# Patient Record
Sex: Female | Born: 1935 | Race: White | Hispanic: No | Smoking: Never smoker
Health system: Southern US, Community
[De-identification: ages and names within clinical notes are randomized; demographics above are authoritative.]

## PROBLEM LIST (undated history)

## (undated) DIAGNOSIS — T4145XA Adverse effect of unspecified anesthetic, initial encounter: Secondary | ICD-10-CM

## (undated) DIAGNOSIS — Z9221 Personal history of antineoplastic chemotherapy: Secondary | ICD-10-CM

## (undated) DIAGNOSIS — G629 Polyneuropathy, unspecified: Secondary | ICD-10-CM

## (undated) DIAGNOSIS — T8859XA Other complications of anesthesia, initial encounter: Secondary | ICD-10-CM

## (undated) DIAGNOSIS — G5793 Unspecified mononeuropathy of bilateral lower limbs: Secondary | ICD-10-CM

## (undated) DIAGNOSIS — C44301 Unspecified malignant neoplasm of skin of nose: Secondary | ICD-10-CM

## (undated) DIAGNOSIS — K219 Gastro-esophageal reflux disease without esophagitis: Secondary | ICD-10-CM

## (undated) DIAGNOSIS — C50919 Malignant neoplasm of unspecified site of unspecified female breast: Secondary | ICD-10-CM

## (undated) DIAGNOSIS — I499 Cardiac arrhythmia, unspecified: Secondary | ICD-10-CM

## (undated) DIAGNOSIS — F419 Anxiety disorder, unspecified: Secondary | ICD-10-CM

## (undated) DIAGNOSIS — J189 Pneumonia, unspecified organism: Secondary | ICD-10-CM

## (undated) DIAGNOSIS — Z923 Personal history of irradiation: Secondary | ICD-10-CM

## (undated) DIAGNOSIS — C44309 Unspecified malignant neoplasm of skin of other parts of face: Secondary | ICD-10-CM

## (undated) DIAGNOSIS — M1990 Unspecified osteoarthritis, unspecified site: Secondary | ICD-10-CM

## (undated) DIAGNOSIS — I10 Essential (primary) hypertension: Secondary | ICD-10-CM

## (undated) HISTORY — PX: PARTIAL KNEE ARTHROPLASTY: SHX2174

## (undated) HISTORY — DX: Polyneuropathy, unspecified: G62.9

## (undated) HISTORY — PX: JOINT REPLACEMENT: SHX530

## (undated) HISTORY — PX: CATARACT EXTRACTION: SUR2

## (undated) HISTORY — PX: ABDOMINAL HYSTERECTOMY: SHX81

## (undated) HISTORY — PX: NASAL SEPTUM SURGERY: SHX37

## (undated) HISTORY — DX: Unspecified malignant neoplasm of skin of other parts of face: C44.309

## (undated) HISTORY — PX: TOTAL VAGINAL HYSTERECTOMY: SHX2548

## (undated) HISTORY — PX: CHOLECYSTECTOMY: SHX55

## (undated) HISTORY — DX: Unspecified malignant neoplasm of skin of nose: C44.301

---

## 1958-12-09 HISTORY — PX: BREAST BIOPSY: SHX20

## 1998-12-09 DIAGNOSIS — C50919 Malignant neoplasm of unspecified site of unspecified female breast: Secondary | ICD-10-CM

## 1998-12-09 HISTORY — DX: Malignant neoplasm of unspecified site of unspecified female breast: C50.919

## 1998-12-09 HISTORY — PX: BREAST LUMPECTOMY: SHX2

## 2005-09-16 ENCOUNTER — Encounter: Payer: Self-pay | Admitting: Internal Medicine

## 2005-10-09 ENCOUNTER — Encounter: Payer: Self-pay | Admitting: Internal Medicine

## 2007-11-24 ENCOUNTER — Ambulatory Visit: Payer: Self-pay | Admitting: Internal Medicine

## 2008-05-09 ENCOUNTER — Ambulatory Visit: Payer: Self-pay | Admitting: Internal Medicine

## 2008-05-26 ENCOUNTER — Ambulatory Visit: Payer: Self-pay | Admitting: Internal Medicine

## 2008-06-08 ENCOUNTER — Ambulatory Visit: Payer: Self-pay | Admitting: Internal Medicine

## 2008-10-06 ENCOUNTER — Ambulatory Visit: Payer: Self-pay | Admitting: Internal Medicine

## 2008-10-09 ENCOUNTER — Ambulatory Visit: Payer: Self-pay | Admitting: Internal Medicine

## 2008-10-11 ENCOUNTER — Ambulatory Visit: Payer: Self-pay | Admitting: Internal Medicine

## 2008-11-08 ENCOUNTER — Ambulatory Visit: Payer: Self-pay | Admitting: Internal Medicine

## 2008-11-09 ENCOUNTER — Ambulatory Visit: Payer: Self-pay | Admitting: Orthopedic Surgery

## 2008-11-16 ENCOUNTER — Inpatient Hospital Stay: Payer: Self-pay | Admitting: Orthopedic Surgery

## 2008-11-18 ENCOUNTER — Encounter: Payer: Self-pay | Admitting: Anatomic Pathology & Clinical Pathology

## 2008-12-05 ENCOUNTER — Encounter: Payer: Self-pay | Admitting: Internal Medicine

## 2008-12-09 ENCOUNTER — Encounter: Payer: Self-pay | Admitting: Internal Medicine

## 2008-12-29 ENCOUNTER — Ambulatory Visit: Payer: Self-pay | Admitting: Orthopedic Surgery

## 2009-01-09 ENCOUNTER — Encounter: Payer: Self-pay | Admitting: Internal Medicine

## 2009-01-09 ENCOUNTER — Ambulatory Visit: Payer: Self-pay | Admitting: Internal Medicine

## 2009-02-03 ENCOUNTER — Ambulatory Visit: Payer: Self-pay | Admitting: Internal Medicine

## 2009-02-06 ENCOUNTER — Encounter: Payer: Self-pay | Admitting: Internal Medicine

## 2009-02-06 ENCOUNTER — Ambulatory Visit: Payer: Self-pay | Admitting: Internal Medicine

## 2009-04-06 ENCOUNTER — Ambulatory Visit: Payer: Self-pay | Admitting: Internal Medicine

## 2009-04-08 ENCOUNTER — Ambulatory Visit: Payer: Self-pay | Admitting: Internal Medicine

## 2009-04-12 ENCOUNTER — Ambulatory Visit: Payer: Self-pay | Admitting: Internal Medicine

## 2009-05-09 ENCOUNTER — Ambulatory Visit: Payer: Self-pay | Admitting: Internal Medicine

## 2009-09-11 ENCOUNTER — Ambulatory Visit: Payer: Self-pay | Admitting: Internal Medicine

## 2009-10-09 ENCOUNTER — Ambulatory Visit: Payer: Self-pay | Admitting: Internal Medicine

## 2009-10-25 ENCOUNTER — Ambulatory Visit: Payer: Self-pay | Admitting: Internal Medicine

## 2009-11-08 ENCOUNTER — Ambulatory Visit: Payer: Self-pay | Admitting: Internal Medicine

## 2009-11-21 ENCOUNTER — Ambulatory Visit: Payer: Self-pay | Admitting: Internal Medicine

## 2009-12-26 ENCOUNTER — Ambulatory Visit: Payer: Self-pay | Admitting: Pain Medicine

## 2010-01-08 ENCOUNTER — Ambulatory Visit: Payer: Self-pay | Admitting: Pain Medicine

## 2010-01-16 ENCOUNTER — Ambulatory Visit: Payer: Self-pay | Admitting: Pain Medicine

## 2010-02-01 ENCOUNTER — Ambulatory Visit: Payer: Self-pay | Admitting: Pain Medicine

## 2010-10-31 ENCOUNTER — Ambulatory Visit: Payer: Self-pay | Admitting: Internal Medicine

## 2010-11-21 ENCOUNTER — Ambulatory Visit: Payer: Self-pay | Admitting: Internal Medicine

## 2011-02-05 ENCOUNTER — Ambulatory Visit: Payer: Self-pay | Admitting: Internal Medicine

## 2011-02-06 LAB — CANCER ANTIGEN 27.29: CA 27.29: 14.7 U/mL (ref 0.0–38.6)

## 2011-02-07 ENCOUNTER — Ambulatory Visit: Payer: Self-pay | Admitting: Internal Medicine

## 2011-10-29 ENCOUNTER — Ambulatory Visit: Payer: Self-pay | Admitting: Internal Medicine

## 2012-02-06 ENCOUNTER — Ambulatory Visit: Payer: Self-pay | Admitting: Oncology

## 2012-02-06 LAB — COMPREHENSIVE METABOLIC PANEL
Albumin: 3.6 g/dL (ref 3.4–5.0)
Alkaline Phosphatase: 89 U/L (ref 50–136)
Anion Gap: 7 (ref 7–16)
BUN: 18 mg/dL (ref 7–18)
Bilirubin,Total: 0.5 mg/dL (ref 0.2–1.0)
Calcium, Total: 8.8 mg/dL (ref 8.5–10.1)
Chloride: 101 mmol/L (ref 98–107)
Co2: 31 mmol/L (ref 21–32)
Creatinine: 0.76 mg/dL (ref 0.60–1.30)
EGFR (African American): >60
EGFR (Non-African Amer.): >60
Glucose: 94 mg/dL (ref 65–99)
Osmolality: 279 (ref 275–301)
Potassium: 3.7 mmol/L (ref 3.5–5.1)
SGOT(AST): 19 U/L (ref 15–37)
SGPT (ALT): 24 U/L
Sodium: 139 mmol/L (ref 136–145)
Total Protein: 7.5 g/dL (ref 6.4–8.2)

## 2012-02-06 LAB — CBC CANCER CENTER
Basophil #: 0 x10 3/mm (ref 0.0–0.1)
Basophil %: 0.5 %
Eosinophil #: 0.2 x10 3/mm (ref 0.0–0.7)
Eosinophil %: 3.2 %
HCT: 39.4 % (ref 35.0–47.0)
HGB: 13.5 g/dL (ref 12.0–16.0)
Lymphocyte #: 1.1 x10 3/mm (ref 1.0–3.6)
Lymphocyte %: 17 %
MCH: 30.4 pg (ref 26.0–34.0)
MCHC: 34.2 g/dL (ref 32.0–36.0)
MCV: 89 fL (ref 80–100)
Monocyte #: 0.5 x10 3/mm (ref 0.0–0.7)
Monocyte %: 7.1 %
Neutrophil #: 4.9 x10 3/mm (ref 1.4–6.5)
Neutrophil %: 72.2 %
Platelet: 207 x10 3/mm (ref 150–440)
RBC: 4.43 10*6/uL (ref 3.80–5.20)
RDW: 14.3 % (ref 11.5–14.5)
WBC: 6.8 x10 3/mm (ref 3.6–11.0)

## 2012-02-07 ENCOUNTER — Ambulatory Visit: Payer: Self-pay | Admitting: Oncology

## 2012-02-07 LAB — CANCER ANTIGEN 27.29: CA 27.29: 19.1 U/mL (ref 0.0–38.6)

## 2012-05-17 ENCOUNTER — Encounter (HOSPITAL_COMMUNITY): Payer: Self-pay

## 2012-05-17 ENCOUNTER — Emergency Department (HOSPITAL_COMMUNITY)
Admission: EM | Admit: 2012-05-17 | Discharge: 2012-05-17 | Disposition: A | Discharge status: Home / Self Care | Payer: MEDICARE | Source: Home / Self Care | Attending: Emergency Medicine | Admitting: Emergency Medicine

## 2012-05-17 DIAGNOSIS — L259 Unspecified contact dermatitis, unspecified cause: Secondary | ICD-10-CM

## 2012-05-17 HISTORY — DX: Unspecified mononeuropathy of bilateral lower limbs: G57.93

## 2012-05-17 HISTORY — DX: Essential (primary) hypertension: I10

## 2012-05-17 NOTE — Discharge Instructions (Signed)
Carefully use the cortisone cream on the rash twice a day; be extra careful to not get it in your eye.  Try taking Claritin (or the generic version called loratadine) once daily to help with the itching.  If this does not get better or if you feel that it is spreading and getting worse, follow up with your regular doctor or your dermatologist.    Contact Dermatitis Contact dermatitis is a rash that happens when something touches the skin. You touched something that irritates your skin, or you have allergies to something you touched. HOME CARE   Avoid the thing that caused your rash.   Keep your rash away from hot water, soap, sunlight, chemicals, and other things that might bother it.   Do not scratch your rash.   You can take cool baths to help stop itching.   Only take medicine as told by your doctor.   Keep all doctor visits as told.  GET HELP RIGHT AWAY IF:   Your rash is not better after 3 days.   Your rash gets worse.   Your rash is puffy (swollen), tender, red, sore, or warm.   You have problems with your medicine.  MAKE SURE YOU:   Understand these instructions.   Will watch your condition.   Will get help right away if you are not doing well or get worse.  Document Released: 09/22/2009 Document Revised: 11/14/2011 Document Reviewed: 04/30/2011 [facility] Patient Information 2012 [place], [place].

## 2012-05-17 NOTE — ED Provider Notes (Signed)
Medical screening examination/treatment/procedure(s) were performed by non-physician practitioner and as supervising physician I was immediately available for consultation/collaboration.  Luiz Blare MD   Luiz Blare, MD 05/17/12 [id number]

## 2012-05-17 NOTE — ED Provider Notes (Signed)
History     CSN: [id number]  Arrival date & time 05/17/12  1402   First MD Initiated Contact with Patient 05/17/12 1625      Chief Complaint  Patient presents with  . Rash    (Consider location/radiation/quality/duration/timing/severity/associated sxs/prior treatment) HPI Comments: Pt noticed itching red spot under R eye yesterday, describes as red with blister, scratched it, now has similar spot above it on R lower eyelid.  Describes as non painful but very itching.  Has not been spending time outdoors in woods, only around own plants in apartment.    Patient is a 78 y.o. female presenting with rash. The history is provided by the patient.  Rash  The current episode started yesterday. The problem has been gradually worsening. The problem is associated with an unknown factor. There has been no fever. The rash is present on the face. The patient is experiencing no pain. Associated symptoms include itching. She has tried anti-itch cream for the symptoms. The treatment provided mild relief.    Past Medical History  Diagnosis Date  . Hypertension   . Neuropathic pain of both legs     History reviewed. No pertinent past surgical history.  History reviewed. No pertinent family history.  History  Substance Use Topics  . Smoking status: Never Smoker   . Smokeless tobacco: Not on file  . Alcohol Use: No    OB History    Grav Para Term Preterm Abortions TAB SAB Ect Mult Living                  Review of Systems  Constitutional: Negative for fever and chills.  Eyes: Negative for pain, discharge, redness, itching and visual disturbance.  Skin: Positive for itching and rash.  Neurological: Negative for numbness.       Denies tingling or burning pain sensation in rash area; received shingles vaccine years ago    Allergies  Sulfa antibiotics  Home Medications   Current Outpatient Rx  Name Route Sig Dispense Refill  . GABAPENTIN 300 MG PO CAPS Oral Take 300 mg by mouth 3  (three) times daily.    Marland Kitchen LISINOPRIL-HYDROCHLOROTHIAZIDE 10-12.5 MG PO TABS Oral Take 1 tablet by mouth daily.      BP 160/90  Pulse 69  Temp(Src) 98 F (36.7 C) (Oral)  Resp 12  SpO2 95%  Physical Exam  Constitutional: She appears well-developed and well-nourished. No distress.  Eyes: Conjunctivae and lids are normal.    Pulmonary/Chest: Effort normal.  Skin: Skin is warm and dry. Rash noted. Rash is vesicular.       See eye exam for rash location. 2 erythematous areas below R eye with open areas; no vesicles at this time but pt reports vesicles, and rash has appearance of opened vesicles.     ED Course  Procedures (including critical care time)  Labs Reviewed - No data to display No results found.   1. Contact dermatitis       MDM  Appears c/w contact derm. Pt to f/u with pcp if worsens or doesn't improve.  Pt refuses offer of  Po steroids to help resolve sx, elects to continue using otc cortisone cream.         Cathlyn Parsons, NP 05/17/12 1707

## 2012-05-17 NOTE — ED Notes (Signed)
Pt has lesion under rt eye that started itching yesterday and then developed another lesion near lower lid, denies pain to the area.

## 2012-07-13 ENCOUNTER — Encounter: Payer: Self-pay | Admitting: Internal Medicine

## 2012-08-09 ENCOUNTER — Encounter: Payer: Self-pay | Admitting: Internal Medicine

## 2012-08-17 ENCOUNTER — Encounter: Payer: Self-pay | Admitting: Cardiovascular Disease

## 2012-08-17 ENCOUNTER — Ambulatory Visit (INDEPENDENT_AMBULATORY_CARE_PROVIDER_SITE_OTHER): Payer: MEDICARE | Admitting: Cardiovascular Disease

## 2012-08-17 VITALS — BP 150/80 | HR 57 | Ht 65.0 in | Wt 175.8 lb

## 2012-08-17 DIAGNOSIS — F4320 Adjustment disorder, unspecified: Secondary | ICD-10-CM

## 2012-08-17 DIAGNOSIS — I10 Essential (primary) hypertension: Secondary | ICD-10-CM

## 2012-08-17 DIAGNOSIS — Z853 Personal history of malignant neoplasm of breast: Secondary | ICD-10-CM

## 2012-08-17 NOTE — Progress Notes (Signed)
Patient ID: Kelli Williams, female    DOB: 14-Aug-1926, 78 y.o.   MRN: [id number]  HPI Comments: Kelli Williams is a very pleasant 78 year old woman with no known coronary artery disease or cardiac issues who presents to establish care. She reports that she had radiation treatment for breast cancer in 2000 on the left with 7 weeks of radiation on a daily basis..  She denies any significant chest pain. She did have stress test last year at the recommendation of her cardiologist at [facility] as she had not had one in the past. He feels well, is active and takes care of her husband who has significant Alzheimer's. Her blood pressures labile which she attributes to stress at home.  She does report that her mother had diabetes and CHF.  Lab work shows total cholesterol 162, LDL 88 EKG shows normal sinus rhythm with right bundle branch block, rate 78 beats per minute, no significant ST or T wave changes   Outpatient Encounter Prescriptions as of 08/17/2012  Medication Sig Dispense Refill  . Calcium-Magnesium 500-250 MG TABS Take by mouth daily.      . cholecalciferol (VITAMIN D) 1000 UNITS tablet Take 1,000 Units by mouth daily.      . Coenzyme Q10 (CO Q-10) 100 MG CAPS Take by mouth daily.      . Docosahexaenoic Acid (DHA OMEGA 3 PO) Take 180 mg by mouth daily.      Marland Kitchen gabapentin (NEURONTIN) 300 MG capsule Take 300 mg by mouth 2 (two) times daily.       Marland Kitchen lisinopril-hydrochlorothiazide (PRINZIDE,ZESTORETIC) 20-25 MG per tablet Take 1 tablet by mouth daily.      . Multiple Vitamin (MULTIVITAMIN) tablet Take 1 tablet by mouth daily.      . Thiamine HCl (B-1 PO) Take 160 mg by mouth daily.      Marland Kitchen DISCONTD: lisinopril-hydrochlorothiazide (PRINZIDE,ZESTORETIC) 10-12.5 MG per tablet Take 1 tablet by mouth daily.        Review of Systems  Constitutional: Negative.   HENT: Negative.   Eyes: Negative.   Respiratory: Negative.   Cardiovascular: Negative.   Gastrointestinal: Negative.   Musculoskeletal:  Negative.   Skin: Negative.   Neurological: Negative.   Hematological: Negative.   Psychiatric/Behavioral: Negative.   All other systems reviewed and are negative.    BP 150/80  Pulse 57  Ht 5\' 5"  (1.651 m)  Wt 175 lb 12 oz (79.72 kg)  BMI 29.25 kg/m2  Physical Exam  Nursing note and vitals reviewed. Constitutional: She is oriented to person, place, and time. She appears well-developed and well-nourished.  HENT:  Head: Normocephalic.  Nose: Nose normal.  Mouth/Throat: Oropharynx is clear and moist.  Eyes: Conjunctivae are normal. Pupils are equal, round, and reactive to light.  Neck: Normal range of motion. Neck supple. No JVD present.  Cardiovascular: Normal rate, regular rhythm, S1 normal, S2 normal, normal heart sounds and intact distal pulses.  Exam reveals no gallop and no friction rub.   No murmur heard. Pulmonary/Chest: Effort normal and breath sounds normal. No respiratory distress. She has no wheezes. She has no rales. She exhibits no tenderness.  Abdominal: Soft. Bowel sounds are normal. She exhibits no distension. There is no tenderness.  Musculoskeletal: Normal range of motion. She exhibits no edema and no tenderness.  Lymphadenopathy:    She has no cervical adenopathy.  Neurological: She is alert and oriented to person, place, and time. Coordination normal.  Skin: Skin is warm and dry. No rash noted. No  erythema.  Psychiatric: She has a normal mood and affect. Her behavior is normal. Judgment and thought content normal.         Assessment and Plan

## 2012-08-17 NOTE — Assessment & Plan Note (Signed)
History of radiation treatment on the left following lumpectomy. She is nonsmoker, nondiabetic, cholesterol is well controlled. Negative stress test last year by her report.

## 2012-08-17 NOTE — Assessment & Plan Note (Signed)
Blood pressure is well controlled on today's visit. No changes made to the medications. 

## 2012-08-17 NOTE — Patient Instructions (Addendum)
You are doing well. No medication changes were made.  Please call us if you have new issues that need to be addressed before your next appt.  Your physician wants you to follow-up in: 12 months.  You will receive a reminder letter in the mail two months in advance. If you don't receive a letter, please call our office to schedule the follow-up appointment. 

## 2012-08-17 NOTE — Assessment & Plan Note (Signed)
She has significant stress managing her husband's medical issues. She does have family in town but does need a respite.

## 2012-09-08 ENCOUNTER — Encounter: Payer: Self-pay | Admitting: Internal Medicine

## 2012-11-03 ENCOUNTER — Ambulatory Visit: Payer: Self-pay | Admitting: Internal Medicine

## 2012-11-27 ENCOUNTER — Ambulatory Visit: Payer: Self-pay | Admitting: Ophthalmology

## 2012-11-27 LAB — POTASSIUM: Potassium: 3.7 mmol/L (ref 3.5–5.1)

## 2012-12-07 ENCOUNTER — Ambulatory Visit: Payer: Self-pay | Admitting: Ophthalmology

## 2013-08-24 ENCOUNTER — Encounter: Payer: Self-pay | Admitting: Cardiovascular Disease

## 2013-08-24 ENCOUNTER — Ambulatory Visit (INDEPENDENT_AMBULATORY_CARE_PROVIDER_SITE_OTHER): Payer: MEDICARE | Admitting: Cardiovascular Disease

## 2013-08-24 VITALS — BP 140/72 | HR 66 | Ht 65.0 in | Wt 177.2 lb

## 2013-08-24 DIAGNOSIS — Z0000 Encounter for general adult medical examination without abnormal findings: Secondary | ICD-10-CM

## 2013-08-24 DIAGNOSIS — F4320 Adjustment disorder, unspecified: Secondary | ICD-10-CM

## 2013-08-24 DIAGNOSIS — I10 Essential (primary) hypertension: Secondary | ICD-10-CM

## 2013-08-24 NOTE — Assessment & Plan Note (Signed)
Overall she is doing well. Cholesterol well controlled. No further testing needed at this time

## 2013-08-24 NOTE — Assessment & Plan Note (Signed)
Blood pressure is well controlled on today's visit. No changes made to the medications. 

## 2013-08-24 NOTE — Assessment & Plan Note (Signed)
Significant stress at home taking care of her husband with dementia.

## 2013-08-24 NOTE — Progress Notes (Signed)
Patient ID: Kelli Williams, female    DOB: 02-08-1926, 79 y.o.   MRN: [id number]  HPI Comments: Kelli Williams is a very pleasant 79 year old woman with no known coronary artery disease or cardiac issues who presents for routine followup Status post radiation treatment for breast cancer in 2000 on the left with 7 weeks of radiation on a daily basis.   She denies any significant chest pain. She did have stress test in 2012 at the recommendation of her cardiologist at [facility] He feels well, is active and takes care of her husband who has significant Alzheimer's. Her blood pressures labile which she attributes to stress at home. She does report having mild edema of her legs bilaterally, worse after being on her feet all day   mother had diabetes and CHF.  Lab work from September 2013 shows total cholesterol 167, LDL 91 EKG shows normal sinus rhythm with right bundle branch block, rate 66 beats per minute, no significant ST or T wave changes   Outpatient Encounter Prescriptions as of 08/24/2013  Medication Sig Dispense Refill  . aspirin 81 MG tablet Take 81 mg by mouth daily.      . Calcium-Magnesium 500-250 MG TABS Take by mouth daily.      . cholecalciferol (VITAMIN D) 1000 UNITS tablet Take 1,000 Units by mouth daily.      . Coenzyme Q10 (CO Q-10) 100 MG CAPS Take by mouth daily.      . Docosahexaenoic Acid (DHA OMEGA 3 PO) Take 180 mg by mouth daily.      Marland Kitchen gabapentin (NEURONTIN) 300 MG capsule Take 300 mg by mouth 2 (two) times daily. Takes 1 tablet in the am with 2 tablets in the pm.      . lisinopril-hydrochlorothiazide (PRINZIDE,ZESTORETIC) 20-25 MG per tablet Take 1 tablet by mouth daily.      . Multiple Vitamin (MULTIVITAMIN) tablet Take 1 tablet by mouth daily.      . Thiamine HCl (B-1 PO) Take 160 mg by mouth daily.       No facility-administered encounter medications on file as of 08/24/2013.    Review of Systems  Constitutional: Negative.   HENT: Negative.   Eyes: Negative.    Respiratory: Negative.   Cardiovascular: Negative.   Gastrointestinal: Negative.   Endocrine: Negative.   Musculoskeletal: Negative.   Skin: Negative.   Allergic/Immunologic: Negative.   Neurological: Negative.   Hematological: Negative.   Psychiatric/Behavioral: Negative.   All other systems reviewed and are negative.   BP 140/72  Pulse 66  Ht 5\' 5"  (1.651 m)  Wt 177 lb 4 oz (80.4 kg)  BMI 29.5 kg/m2  Physical Exam  Nursing note and vitals reviewed. Constitutional: She is oriented to person, place, and time. She appears well-developed and well-nourished.  HENT:  Head: Normocephalic.  Nose: Nose normal.  Mouth/Throat: Oropharynx is clear and moist.  Eyes: Conjunctivae are normal. Pupils are equal, round, and reactive to light.  Neck: Normal range of motion. Neck supple. No JVD present.  Cardiovascular: Normal rate, regular rhythm, S1 normal, S2 normal, normal heart sounds and intact distal pulses.  Exam reveals no gallop and no friction rub.   No murmur heard. Pulmonary/Chest: Effort normal and breath sounds normal. No respiratory distress. She has no wheezes. She has no rales. She exhibits no tenderness.  Abdominal: Soft. Bowel sounds are normal. She exhibits no distension. There is no tenderness.  Musculoskeletal: Normal range of motion. She exhibits no edema and no tenderness.  Lymphadenopathy:  She has no cervical adenopathy.  Neurological: She is alert and oriented to person, place, and time. Coordination normal.  Skin: Skin is warm and dry. No rash noted. No erythema.  Psychiatric: She has a normal mood and affect. Her behavior is normal. Judgment and thought content normal.    Assessment and Plan

## 2013-08-24 NOTE — Patient Instructions (Addendum)
You are doing well. No medication changes were made.  Please call us if you have new issues that need to be addressed before your next appt.  Your physician wants you to follow-up in: 12 months.  You will receive a reminder letter in the mail two months in advance. If you don't receive a letter, please call our office to schedule the follow-up appointment. 

## 2013-12-14 ENCOUNTER — Ambulatory Visit: Payer: Self-pay | Admitting: Family Medicine

## 2014-05-03 ENCOUNTER — Telehealth: Payer: Self-pay | Admitting: *Deleted

## 2014-05-03 NOTE — Telephone Encounter (Signed)
Patient called and stated her blood pressure has been elevated  At 9 am 139/93 At 12 pm today 165/106 It has been elevated for a few weeks  She is experiencing headaches and ringing in her ears  She would like to know if she should increase her blood pressure medications

## 2014-05-05 NOTE — Telephone Encounter (Signed)
Would start amlodipine 5 mg daily Monitor BP

## 2014-05-06 MED ORDER — AMLODIPINE BESYLATE 5 MG PO TABS: 5.0000 mg | ORAL_TABLET | Freq: Every day | ORAL | Status: DC

## 2014-05-06 NOTE — Telephone Encounter (Signed)
Spoke w/ pt.  Advised her of Dr. Donivan Scull recommendation.  She reports that she broke a bone in her foot and has been walking around on it for some time now.  She is agreeable and will call w/ further questions or concerns.

## 2014-05-20 ENCOUNTER — Telehealth: Payer: Self-pay

## 2014-05-20 NOTE — Telephone Encounter (Signed)
Pt called states she was taking HTCZ Lisinopril, states BP was "getting a little too high", then she started on Amlodipine , states this makes her very lightheaded, but has helped her BP 149/67.

## 2014-05-20 NOTE — Telephone Encounter (Signed)
LVM 6/12

## 2014-05-24 NOTE — Telephone Encounter (Signed)
Spoke w/ pt.  She reports that her BP has been "all over the place". She has a broken foot, hobbling around on a boot, taking Mobic for inflammation.  Her husband is total care and requires her to help him w/ ADLs. Pt's CNA did not come yesterday, and BP was elevated after getting husband in shower: 144/102. Recent BP readings: 118/72, 121/70, 149/69, 121/70, 118/72, 119/74, 107/65.  She was prescribed amlodipine 5mg  at last ov on 05/06/14 and states that she does not like the way this med makes her feel.  Pt reports that she feels tired, and not like herself.  She took a 1/2 amlodipine yesterday, but states that she did not feel any different.  Pt admits that she is under quite a bit of stress and is unsure if Mobic may be contributing to the way she feels. Advised pt that BP numbers look good, but she states that she does not like the amlodipine and would like to know if there is something else that she can take that will not make her feel so tired.  Please advise.  Thank you.

## 2014-05-24 NOTE — Telephone Encounter (Signed)
Blood pressure numbers look good without the amlodipine Perhaps continue to monitor the blood pressure for now, no new medication Hold the amlodipine

## 2014-05-25 NOTE — Telephone Encounter (Signed)
Spoke w/ pt.  Advised her of Dr. Donivan Scull recommendation. She is agreeable and will call w/ further questions or concerns.

## 2014-08-24 ENCOUNTER — Ambulatory Visit (INDEPENDENT_AMBULATORY_CARE_PROVIDER_SITE_OTHER): Payer: Medicare Other | Admitting: Cardiovascular Disease

## 2014-08-24 ENCOUNTER — Encounter: Payer: Self-pay | Admitting: Cardiovascular Disease

## 2014-08-24 VITALS — BP 140/82 | HR 63 | Ht 64.0 in | Wt 182.5 lb

## 2014-08-24 DIAGNOSIS — F4329 Adjustment disorder with other symptoms: Secondary | ICD-10-CM

## 2014-08-24 DIAGNOSIS — I498 Other specified cardiac arrhythmias: Secondary | ICD-10-CM

## 2014-08-24 DIAGNOSIS — F438 Other reactions to severe stress: Secondary | ICD-10-CM

## 2014-08-24 DIAGNOSIS — I10 Essential (primary) hypertension: Secondary | ICD-10-CM

## 2014-08-24 DIAGNOSIS — I471 Supraventricular tachycardia: Secondary | ICD-10-CM

## 2014-08-24 DIAGNOSIS — F4389 Other reactions to severe stress: Secondary | ICD-10-CM

## 2014-08-24 MED ORDER — PROPRANOLOL HCL 10 MG PO TABS: 10.0000 mg | ORAL_TABLET | Freq: Three times a day (TID) | ORAL | Status: DC | PRN

## 2014-08-24 NOTE — Progress Notes (Signed)
Patient ID: Kelli Williams, female    DOB: 02-09-1926, 80 y.o.   MRN: [id number]  HPI Comments: Kelli Williams is a very pleasant 80 year old woman with no known coronary artery disease or cardiac issues who presents for routine followup Status post radiation treatment for breast cancer in 2000 on the left with 7 weeks of radiation on a daily basis.   In followup today, she reports having some palpitations/tachycardia most commonly at nighttime. These are worse if she is tired. Less symptoms in the daytime. She continues to have stress taking care of her husband. She does not have enough help at home, especially on the weekend  She denies any significant chest pain. No significant shortness of breath with exertion  She did have stress test in 2012 at the recommendation of her cardiologist at [facility]  Her blood pressures continued to be labile which she attributes to stress at home. She does report having mild edema of her legs bilaterally, worse after being on her feet all day She reports having a fracture in her right foot, seen by podiatry and Dr. Marry Guan   mother had diabetes and CHF.  Lab work from September 2013 shows total cholesterol 167, LDL 91 EKG shows normal sinus rhythm with right bundle branch block, rate 63 beats per minute, no significant ST or T wave changes   Outpatient Encounter Prescriptions as of 08/24/2014  Medication Sig  . amLODipine (NORVASC) 5 MG tablet Take 1 tablet (5 mg total) by mouth daily.  Marland Kitchen aspirin 81 MG tablet Take 81 mg by mouth daily.  . Calcium-Magnesium 500-250 MG TABS Take by mouth daily.  . cholecalciferol (VITAMIN D) 1000 UNITS tablet Take 1,000 Units by mouth daily.  . Coenzyme Q10 (CO Q-10) 100 MG CAPS Take by mouth daily.  . Docosahexaenoic Acid (DHA OMEGA 3 PO) Take 180 mg by mouth daily.  Marland Kitchen gabapentin (NEURONTIN) 300 MG capsule Take 300 mg by mouth 2 (two) times daily. Takes 1 tablet in the am with 2 tablets in the pm.  .  lisinopril-hydrochlorothiazide (PRINZIDE,ZESTORETIC) 20-25 MG per tablet Take 1 tablet by mouth daily.  . Multiple Vitamin (MULTIVITAMIN) tablet Take 1 tablet by mouth daily.  . Thiamine HCl (B-1 PO) Take 160 mg by mouth daily.    Review of Systems  Constitutional: Negative.   HENT: Negative.   Eyes: Negative.   Respiratory: Negative.   Cardiovascular: Negative.   Gastrointestinal: Negative.   Endocrine: Negative.   Musculoskeletal: Negative.   Skin: Negative.   Allergic/Immunologic: Negative.   Neurological: Negative.   Hematological: Negative.   Psychiatric/Behavioral: Negative.   All other systems reviewed and are negative.  BP 140/82  Ht 5\' 4"  (1.626 m)  Wt 182 lb 8 oz (82.781 kg)  BMI 31.31 kg/m2  Physical Exam  Nursing note and vitals reviewed. Constitutional: She is oriented to person, place, and time. She appears well-developed and well-nourished.  HENT:  Head: Normocephalic.  Nose: Nose normal.  Mouth/Throat: Oropharynx is clear and moist.  Eyes: Conjunctivae are normal. Pupils are equal, round, and reactive to light.  Neck: Normal range of motion. Neck supple. No JVD present.  Cardiovascular: Normal rate, regular rhythm, S1 normal, S2 normal, normal heart sounds and intact distal pulses.  Exam reveals no gallop and no friction rub.   No murmur heard. Pulmonary/Chest: Effort normal and breath sounds normal. No respiratory distress. She has no wheezes. She has no rales. She exhibits no tenderness.  Abdominal: Soft. Bowel sounds are normal. She exhibits  no distension. There is no tenderness.  Musculoskeletal: Normal range of motion. She exhibits no edema and no tenderness.  Lymphadenopathy:    She has no cervical adenopathy.  Neurological: She is alert and oriented to person, place, and time. Coordination normal.  Skin: Skin is warm and dry. No rash noted. No erythema.  Psychiatric: She has a normal mood and affect. Her behavior is normal. Judgment and thought  content normal.    Assessment and Plan

## 2014-08-24 NOTE — Assessment & Plan Note (Signed)
Continues to care for her husband who has dementia. This has been very stressful

## 2014-08-24 NOTE — Assessment & Plan Note (Signed)
Blood pressure well controlled on today's visit. She reports it is somewhat labile depending on her stress. No medication changes made

## 2014-08-24 NOTE — Assessment & Plan Note (Signed)
Paroxysmal atrial tachycardia. EKG today showed short run of narrow complex tachycardia with rate more than 100 beats per minute. She does appreciate this at nighttime when she is tired. We have suggested she take propranolol 10 mg as needed for symptoms in the evening. Recommended she call us if symptoms do not improve

## 2014-08-24 NOTE — Patient Instructions (Addendum)
You are doing well.  For fast heart rate or tachycardia, Please take propranolol as needed  Please call us if you have new issues that need to be addressed before your next appt.  Your physician wants you to follow-up in: 12 months.  You will receive a reminder letter in the mail two months in advance. If you don't receive a letter, please call our office to schedule the follow-up appointment.

## 2014-10-03 ENCOUNTER — Ambulatory Visit (INDEPENDENT_AMBULATORY_CARE_PROVIDER_SITE_OTHER): Payer: Medicare Other | Admitting: Podiatry

## 2014-10-03 ENCOUNTER — Ambulatory Visit (INDEPENDENT_AMBULATORY_CARE_PROVIDER_SITE_OTHER): Payer: Medicare Other

## 2014-10-03 ENCOUNTER — Encounter: Payer: Self-pay | Admitting: Podiatry

## 2014-10-03 ENCOUNTER — Other Ambulatory Visit: Payer: Self-pay | Admitting: *Deleted

## 2014-10-03 VITALS — BP 161/97 | HR 70 | Resp 16 | Ht 64.0 in | Wt 177.0 lb

## 2014-10-03 DIAGNOSIS — M19079 Primary osteoarthritis, unspecified ankle and foot: Secondary | ICD-10-CM

## 2014-10-03 DIAGNOSIS — M722 Plantar fascial fibromatosis: Secondary | ICD-10-CM

## 2014-10-03 DIAGNOSIS — S92001A Unspecified fracture of right calcaneus, initial encounter for closed fracture: Secondary | ICD-10-CM

## 2014-10-03 NOTE — Progress Notes (Signed)
   Subjective:    Patient ID: Kelli Williams, female    DOB: Apr 02, 1926, 80 y.o.   MRN: [id number]  HPI Comments: The right foot , has been hurting since the 1st of the year, top of foot all the way to the big toe . Wore a big boot for awhile and been to several other doctors,  i have been to the vascular doctor for studies to rule out blood clot .   Foot Pain      Review of Systems  All other systems reviewed and are negative.      Objective:   Physical Exam: I have reviewed her past medical history medications allergies surgery social history and radiographs that she brought with her today. Pulses are strongly palpable bilateral. Neurologic sensorium is intact per Semmes-Weinstein monofilament. Deep tendon reflexes are intact bilateral and muscle strength is 5 over 5 dorsiflexors plantar flexors and inverters everters all intrinsic musculature is intact. Orthopedic evaluation demonstrates all joints distal to the ankle for range of motion without crepitation. She does have severe pes planus bilateral with pain on palpation to the first metatarsal medial cuneiform joint extending over to the third metatarsal cuneiform joint. Right foot appears to be worse than the left. Radiographic evaluation does demonstrate severe osteoarthritis in this area bilateral with severe pes planus.        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  Plan: Injected dexamethasone/Kenalog and local anesthetic to the dorsal aspect of the bilateral foot. I will follow up with her in a few months if needed.

## 2014-12-05 ENCOUNTER — Encounter: Payer: Self-pay | Admitting: Cardiovascular Disease

## 2014-12-09 DIAGNOSIS — J189 Pneumonia, unspecified organism: Secondary | ICD-10-CM

## 2014-12-09 HISTORY — DX: Pneumonia, unspecified organism: J18.9

## 2015-02-15 DIAGNOSIS — I10 Essential (primary) hypertension: Secondary | ICD-10-CM | POA: Diagnosis not present

## 2015-02-15 DIAGNOSIS — I472 Ventricular tachycardia: Secondary | ICD-10-CM | POA: Diagnosis not present

## 2015-02-15 DIAGNOSIS — G609 Hereditary and idiopathic neuropathy, unspecified: Secondary | ICD-10-CM | POA: Diagnosis not present

## 2015-02-15 DIAGNOSIS — M19171 Post-traumatic osteoarthritis, right ankle and foot: Secondary | ICD-10-CM | POA: Diagnosis not present

## 2015-02-15 DIAGNOSIS — Z853 Personal history of malignant neoplasm of breast: Secondary | ICD-10-CM | POA: Diagnosis not present

## 2015-02-27 DIAGNOSIS — I10 Essential (primary) hypertension: Secondary | ICD-10-CM | POA: Diagnosis not present

## 2015-02-27 DIAGNOSIS — J40 Bronchitis, not specified as acute or chronic: Secondary | ICD-10-CM | POA: Diagnosis not present

## 2015-03-10 DIAGNOSIS — G609 Hereditary and idiopathic neuropathy, unspecified: Secondary | ICD-10-CM | POA: Diagnosis not present

## 2015-03-10 DIAGNOSIS — M19171 Post-traumatic osteoarthritis, right ankle and foot: Secondary | ICD-10-CM | POA: Diagnosis not present

## 2015-03-10 DIAGNOSIS — R262 Difficulty in walking, not elsewhere classified: Secondary | ICD-10-CM | POA: Diagnosis not present

## 2015-03-10 DIAGNOSIS — J40 Bronchitis, not specified as acute or chronic: Secondary | ICD-10-CM | POA: Diagnosis not present

## 2015-03-14 ENCOUNTER — Ambulatory Visit
Admit: 2015-03-14 | Disposition: A | Discharge status: Home / Self Care | Payer: Self-pay | Attending: Internal Medicine | Admitting: Internal Medicine

## 2015-03-14 DIAGNOSIS — Z853 Personal history of malignant neoplasm of breast: Secondary | ICD-10-CM | POA: Diagnosis not present

## 2015-03-14 DIAGNOSIS — Z1231 Encounter for screening mammogram for malignant neoplasm of breast: Secondary | ICD-10-CM | POA: Diagnosis not present

## 2015-03-31 NOTE — Op Note (Signed)
PATIENT NAME:  Kelli Williams, Kelli Williams MR#:  [id number] DATE OF BIRTH:  [birth year]  DATE OF PROCEDURE:  12/07/2012  PREOPERATIVE DIAGNOSIS:  Cataract, left eye.    POSTOPERATIVE DIAGNOSIS:  Cataract, left eye.  PROCEDURE PERFORMED:  Extracapsular cataract extraction using phacoemulsification with placement of an Alcon SN60WS, 22.0-diopter posterior chamber lens, serial # [id number].  SURGEON:  Loura Back. Winnie Barsky, MD  ASSISTANT:  None.  ANESTHESIA:  4% lidocaine and 0.75% Marcaine in a 50/50 mixture with 10 units per mL of HyoMax added given as a peribulbar.  ANESTHESIOLOGIST:  Dr. [place] Service  COMPLICATIONS:  None.  ESTIMATED BLOOD LOSS:  Less than 1 ml.  DESCRIPTION OF PROCEDURE:  The patient was brought to the operating room and given a peribulbar block.  The patient was then prepped and draped in the usual fashion.  The vertical rectus muscles were imbricated using 5-0 silk sutures.  These sutures were then clamped to the sterile drapes as bridle sutures.  A limbal peritomy was performed extending two clock hours and hemostasis was obtained with cautery.  A partial thickness scleral groove was made at the surgical limbus and dissected anteriorly in a lamellar dissection using an Alcon crescent knife.  The anterior chamber was entered supero-temporally with a Superblade and through the lamellar dissection with a 2.6 mm keratome.  DisCoVisc was used to replace the aqueous and a continuous tear capsulorrhexis was carried out.  Hydrodissection and hydrodelineation were carried out with balanced salt and a 27 gauge canula.  The nucleus was rotated to confirm the effectiveness of the hydrodissection.  Phacoemulsification was carried out using a divide-and-conquer technique.  Total ultrasound time was 2 minutes and 9 seconds with an average power of 18 percent, CDE 37.17.  Irrigation/aspiration was used to remove the residual cortex.  DisCoVisc was used to inflate the capsule and the internal  incision was enlarged to 3 mm with the crescent knife.  The intraocular lens was folded and inserted into the capsular bag using the AcrySert delivery system.  Irrigation/aspiration was used to remove the residual DisCoVisc.  Miostat was injected into the anterior chamber through the paracentesis track to inflate the anterior chamber and induce miosis.  The wound was checked for leaks and none were found. The conjunctiva was closed with cautery and the bridle sutures were removed.  Two drops of 0.3% Vigamox were placed on the eye.   An eye shield was placed on the eye.  The patient was discharged to the recovery room in good condition.  Keflex 0.1 mL was introduced into the anterior chamber with the 27-gauge cannula pointed underneath the capsular edge.    ____________________________ Loura Back Elianah Karis, MD sad:cs D: 12/07/2012 13:21:00 ET T: 12/07/2012 14:58:24 ET JOB#: [id number]  cc: Remo Lipps A. Sevastian Witczak, MD, <Dictator> Martie Lee MD ELECTRONICALLY SIGNED 12/14/2012 13:47

## 2015-05-10 ENCOUNTER — Telehealth: Payer: Self-pay | Admitting: *Deleted

## 2015-05-10 NOTE — Telephone Encounter (Signed)
Pt c/o swelling: STAT is pt has developed SOB within 24 hours  1. How long have you been experiencing swelling? For a while  2. Where is the swelling located? Left foot  3.  Are you currently taking a "fluid pill"? Yes,   4.  Are you currently SOB? No   5.  Have you traveled recently? No

## 2015-05-10 NOTE — Telephone Encounter (Signed)
Left message for pt to call back  °

## 2015-05-10 NOTE — Telephone Encounter (Signed)
Pt is returning our call 

## 2015-05-11 NOTE — Telephone Encounter (Signed)
Spoke w/ pt.  She reports that she recently had a broken foot and had contributed any swelling to the boot that she was wearing.  She now reports that her left is swollen and she is concerned it is r/t her veins and/or heart.  She reports considerable stress since Dec when her husband's condition worsened and he entered hospice care.  He passed away on 03/15/15. Pt has compression hose but does not wear them due to difficulty putting them on.  Advised her to try to wear them, weigh herself daily and call the office if she develops any SOB.  Advised her that I am scheduling her for Dr. Donivan Scull next available appt, but she is on the waiting list and will be called in the event of a cancellation.  She is appreciative and will call back w/ any further questions or concerns.

## 2015-05-18 DIAGNOSIS — H01003 Unspecified blepharitis right eye, unspecified eyelid: Secondary | ICD-10-CM | POA: Diagnosis not present

## 2015-05-26 DIAGNOSIS — H01003 Unspecified blepharitis right eye, unspecified eyelid: Secondary | ICD-10-CM | POA: Diagnosis not present

## 2015-06-01 ENCOUNTER — Encounter: Payer: Self-pay | Admitting: Internal Medicine

## 2015-06-01 DIAGNOSIS — E559 Vitamin D deficiency, unspecified: Secondary | ICD-10-CM | POA: Insufficient documentation

## 2015-06-01 DIAGNOSIS — M19279 Secondary osteoarthritis, unspecified ankle and foot: Secondary | ICD-10-CM | POA: Insufficient documentation

## 2015-06-01 DIAGNOSIS — L719 Rosacea, unspecified: Secondary | ICD-10-CM | POA: Insufficient documentation

## 2015-06-01 DIAGNOSIS — G609 Hereditary and idiopathic neuropathy, unspecified: Secondary | ICD-10-CM | POA: Insufficient documentation

## 2015-06-16 ENCOUNTER — Other Ambulatory Visit: Payer: Self-pay | Admitting: Internal Medicine

## 2015-06-16 DIAGNOSIS — H01003 Unspecified blepharitis right eye, unspecified eyelid: Secondary | ICD-10-CM | POA: Diagnosis not present

## 2015-06-19 ENCOUNTER — Ambulatory Visit (INDEPENDENT_AMBULATORY_CARE_PROVIDER_SITE_OTHER): Payer: Medicare Other | Admitting: Internal Medicine

## 2015-06-19 ENCOUNTER — Encounter: Payer: Self-pay | Admitting: Internal Medicine

## 2015-06-19 ENCOUNTER — Ambulatory Visit (INDEPENDENT_AMBULATORY_CARE_PROVIDER_SITE_OTHER): Payer: Medicare Other | Admitting: Cardiovascular Disease

## 2015-06-19 ENCOUNTER — Encounter: Payer: Self-pay | Admitting: Cardiovascular Disease

## 2015-06-19 VITALS — BP 120/80 | HR 49 | Ht 64.0 in | Wt 186.0 lb

## 2015-06-19 VITALS — BP 134/70 | HR 64 | Ht 64.0 in | Wt 186.6 lb

## 2015-06-19 DIAGNOSIS — I10 Essential (primary) hypertension: Secondary | ICD-10-CM

## 2015-06-19 DIAGNOSIS — R001 Bradycardia, unspecified: Secondary | ICD-10-CM

## 2015-06-19 DIAGNOSIS — M19279 Secondary osteoarthritis, unspecified ankle and foot: Secondary | ICD-10-CM

## 2015-06-19 DIAGNOSIS — G5792 Unspecified mononeuropathy of left lower limb: Secondary | ICD-10-CM

## 2015-06-19 DIAGNOSIS — G5793 Unspecified mononeuropathy of bilateral lower limbs: Secondary | ICD-10-CM

## 2015-06-19 DIAGNOSIS — F4329 Adjustment disorder with other symptoms: Secondary | ICD-10-CM | POA: Diagnosis not present

## 2015-06-19 DIAGNOSIS — L719 Rosacea, unspecified: Secondary | ICD-10-CM | POA: Diagnosis not present

## 2015-06-19 DIAGNOSIS — I471 Supraventricular tachycardia: Secondary | ICD-10-CM | POA: Diagnosis not present

## 2015-06-19 DIAGNOSIS — Z0000 Encounter for general adult medical examination without abnormal findings: Secondary | ICD-10-CM | POA: Diagnosis not present

## 2015-06-19 DIAGNOSIS — E559 Vitamin D deficiency, unspecified: Secondary | ICD-10-CM | POA: Diagnosis not present

## 2015-06-19 DIAGNOSIS — Z853 Personal history of malignant neoplasm of breast: Secondary | ICD-10-CM | POA: Diagnosis not present

## 2015-06-19 DIAGNOSIS — G5791 Unspecified mononeuropathy of right lower limb: Secondary | ICD-10-CM | POA: Diagnosis not present

## 2015-06-19 LAB — POCT URINALYSIS DIPSTICK
Bilirubin, UA: NEGATIVE
Blood, UA: NEGATIVE
Glucose, UA: NEGATIVE
Ketones, UA: NEGATIVE
Leukocytes, UA: NEGATIVE
Nitrite, UA: NEGATIVE
Protein, UA: NEGATIVE
Spec Grav, UA: 1.015
Urobilinogen, UA: 0.2
pH, UA: 7

## 2015-06-19 MED ORDER — LISINOPRIL-HYDROCHLOROTHIAZIDE 20-25 MG PO TABS: 1.5000 | ORAL_TABLET | Freq: Every day | ORAL | Status: DC

## 2015-06-19 MED ORDER — GABAPENTIN 300 MG PO CAPS: 600.0000 mg | ORAL_CAPSULE | Freq: Two times a day (BID) | ORAL | Status: DC

## 2015-06-19 NOTE — Assessment & Plan Note (Signed)
She has been taking 1.5 tabs of her lisinopril HCTZ. Blood pressure has been relatively well-controlled. We'll hold the propranolol

## 2015-06-19 NOTE — Assessment & Plan Note (Signed)
Recent loss of her husband in March 2016. Lives by herself, encouraged her to participate in more social activities, restart her exercise program She is limited by chronic foot pain, weight, gait instability

## 2015-06-19 NOTE — Patient Instructions (Signed)
Health Maintenance  Topic Date Due  . COLONOSCOPY  12/31/1977  . DEXA SCAN  12/31/1992  . INFLUENZA VACCINE  07/10/2015  . TETANUS/TDAP  06/08/2021  . ZOSTAVAX  Completed  . PNA vac Low Risk Adult  Completed   Breast Self-Awareness Practicing breast self-awareness may pick up problems early, prevent significant medical complications, and possibly save your life. By practicing breast self-awareness, you can become familiar with how your breasts look and feel and if your breasts are changing. This allows you to notice changes early. It can also offer you some reassurance that your breast health is good. One way to learn what is normal for your breasts and whether your breasts are changing is to do a breast self-exam. If you find a lump or something that was not present in the past, it is best to contact your caregiver right away. Other findings that should be evaluated by your caregiver include nipple discharge, especially if it is bloody; skin changes or reddening; areas where the skin seems to be pulled in (retracted); or new lumps and bumps. Breast pain is seldom associated with cancer (malignancy), but should also be evaluated by a caregiver. HOW TO PERFORM A BREAST SELF-EXAM The best time to examine your breasts is 5-7 days after your menstrual period is over. During menstruation, the breasts are lumpier, and it may be more difficult to pick up changes. If you do not menstruate, have reached menopause, or had your uterus removed (hysterectomy), you should examine your breasts at regular intervals, such as monthly. If you are breastfeeding, examine your breasts after a feeding or after using a breast pump. Breast implants do not decrease the risk for lumps or tumors, so continue to perform breast self-exams as recommended. Talk to your caregiver about how to determine the difference between the implant and breast tissue. Also, talk about the amount of pressure you should use during the exam. Over  time, you will become more familiar with the variations of your breasts and more comfortable with the exam. A breast self-exam requires you to remove all your clothes above the waist. 1. Look at your breasts and nipples. Stand in front of a mirror in a room with good lighting. With your hands on your hips, push your hands firmly downward. Look for a difference in shape, contour, and size from one breast to the other (asymmetry). Asymmetry includes puckers, dips, or bumps. Also, look for skin changes, such as reddened or scaly areas on the breasts. Look for nipple changes, such as discharge, dimpling, repositioning, or redness. 2. Carefully feel your breasts. This is best done either in the shower or tub while using soapy water or when flat on your back. Place the arm (on the side of the breast you are examining) above your head. Use the pads (not the fingertips) of your three middle fingers on your opposite hand to feel your breasts. Start in the underarm area and use  inch (2 cm) overlapping circles to feel your breast. Use 3 different levels of pressure (light, medium, and firm pressure) at each circle before moving to the next circle. The light pressure is needed to feel the tissue closest to the skin. The medium pressure will help to feel breast tissue a little deeper, while the firm pressure is needed to feel the tissue close to the ribs. Continue the overlapping circles, moving downward over the breast until you feel your ribs below your breast. Then, move one finger-width towards the center of the body.  Continue to use the  inch (2 cm) overlapping circles to feel your breast as you move slowly up toward the collar bone (clavicle) near the base of the neck. Continue the up and down exam using all 3 pressures until you reach the middle of the chest. Do this with each breast, carefully feeling for lumps or changes. 3.  Keep a written record with breast changes or normal findings for each breast. By writing  this information down, you do not need to depend only on memory for size, tenderness, or location. Write down where you are in your menstrual cycle, if you are still menstruating. Breast tissue can have some lumps or thick tissue. However, see your caregiver if you find anything that concerns you.  SEEK MEDICAL CARE IF:  You see a change in shape, contour, or size of your breasts or nipples.   You see skin changes, such as reddened or scaly areas on the breasts or nipples.   You have an unusual discharge from your nipples.   You feel a new lump or unusually thick areas.  Document Released: 11/25/2005 Document Revised: 11/11/2012 Document Reviewed: 03/11/2012 [facility] Patient Information 2015 [place], [place]. This information is not intended to replace advice given to you by your health care provider. Make sure you discuss any questions you have with your health care provider.

## 2015-06-19 NOTE — Progress Notes (Signed)
Patient ID: Kelli Williams, female    DOB: 1926-05-22, 81 y.o.   MRN: [id number]  HPI Comments: Kelli Williams is a very pleasant 81 year old woman with no known coronary artery disease or cardiac issues who presents for routine followup of her tachycardia/palpitations Status post radiation treatment for breast cancer in 2000 on the left with 7 weeks of radiation on a daily basis.   She lost her husband in March 2016. 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 Chronic pain in her foot following a  Fracture, walks with a cane She is taking lisinopril HCTZ 1.5 tabs daily Also taking propranolol 10 mg up to 3 times per day. No significant symptoms of tachycardia or palpitations No regular exercise program, weight has been trending upwards, eating more comfort food  EKG on today's visit shows normal sinus rhythm with rate 49 bpm, no significant ST or T-wave changes  Other past medical history  She did have stress test in 2012 at the recommendation of her cardiologist at [facility] She reports having a previous fracture in her right foot, seen by podiatry and Dr. Marry Guan  Lab work from September 2013 shows total cholesterol 167, LDL 91  Allergies  Allergen Reactions  . Amitriptyline Hives  . Sulfa Antibiotics Rash  . Sulfa Antibiotics Rash    Current Outpatient Prescriptions on File Prior to Visit  Medication Sig Dispense Refill  . aspirin 81 MG tablet Take 1 tablet by mouth daily.    . B Complex-Folic Acid (BENFOTIAMINE MULTI-B) CAPS Take 1 tablet by mouth daily.    . BENFOTIAMINE PO Take by mouth.    . Cholecalciferol 1000 UNITS capsule Take 1 capsule by mouth daily.    . Coenzyme Q10 (CO Q-10) 100 MG CAPS Take by mouth daily.    . Docosahexaenoic Acid (DHA OMEGA 3 PO) Take 180 mg by mouth daily.    Marland Kitchen gabapentin (NEURONTIN) 300 MG capsule Take 2 capsules (600 mg total) by mouth 2 (two) times daily. 360 capsule 3  . Multiple  Vitamins-Minerals (MULTIVITAMIN WITH MINERALS) tablet Take 1 tablet by mouth daily.    . propranolol (INDERAL) 10 MG tablet Take 10 mg by mouth 3 (three) times daily.      No current facility-administered medications on file prior to visit.    Past Medical History  Diagnosis Date  . Neuropathic pain of both legs   . Skin cancer of forehead   . Skin cancer of nose   . Cancer   . Hypertension   . Neuropathy     Past Surgical History  Procedure Laterality Date  . Partial knee arthroplasty      right  . Total vaginal hysterectomy    . Breast lumpectomy      left  . Cataract extraction      left eye  . Abdominal hysterectomy    . Gallbladder surgery    . Breast lumpectomy    . Right knee replacement      Social History  reports that she has never smoked. She has never used smokeless tobacco. She reports that she does not drink alcohol or use illicit drugs.  Family History family history includes Heart attack in her paternal uncle; Heart disease in her mother.     Review of Systems  Constitutional: Negative.   Respiratory: Negative.   Cardiovascular: Negative.   Gastrointestinal: Negative.   Musculoskeletal: Negative.   Neurological: Negative.   Hematological: Negative.  Psychiatric/Behavioral: Negative.   All other systems reviewed and are negative.  BP 120/80 mmHg  Pulse 49  Ht 5\' 4"  (1.626 m)  Wt 186 lb (84.369 kg)  BMI 31.91 kg/m2  Physical Exam  Constitutional: She is oriented to person, place, and time. She appears well-developed and well-nourished.  HENT:  Head: Normocephalic.  Nose: Nose normal.  Mouth/Throat: Oropharynx is clear and moist.  Eyes: Conjunctivae are normal. Pupils are equal, round, and reactive to light.  Neck: Normal range of motion. Neck supple. No JVD present.  Cardiovascular: Normal rate, regular rhythm, S1 normal, S2 normal, normal heart sounds and intact distal pulses.  Exam reveals no gallop and no friction rub.   No murmur  heard. Pulmonary/Chest: Effort normal and breath sounds normal. No respiratory distress. She has no wheezes. She has no rales. She exhibits no tenderness.  Abdominal: Soft. Bowel sounds are normal. She exhibits no distension. There is no tenderness.  Musculoskeletal: Normal range of motion. She exhibits no edema or tenderness.  Lymphadenopathy:    She has no cervical adenopathy.  Neurological: She is alert and oriented to person, place, and time. Coordination normal.  Skin: Skin is warm and dry. No rash noted. No erythema.  Psychiatric: She has a normal mood and affect. Her behavior is normal. Judgment and thought content normal.    Assessment and Plan  Nursing note and vitals reviewed.

## 2015-06-19 NOTE — Assessment & Plan Note (Signed)
Sinus bradycardia on today's visit. Heart rate 49 bpm. As he is having no symptoms, recommended she take propranolol as needed rather than on a regular basis

## 2015-06-19 NOTE — Patient Instructions (Addendum)
You are doing well.  Please take propranolol as needed for tachycardia/palpitations Stay on the lisinopril HCTZ  1 1/2 pills daily  Please call us if you have new issues that need to be addressed before your next appt.  Your physician wants you to follow-up in: 6 months.  You will receive a reminder letter in the mail two months in advance. If you don't receive a letter, please call our office to schedule the follow-up appointment.

## 2015-06-19 NOTE — Addendum Note (Signed)
Addended by: [name] on: 06/19/2015 05:52 PM   Modules accepted: Level of Service

## 2015-06-19 NOTE — Assessment & Plan Note (Signed)
She continues to be limited by foot discomfort. Walks with a cane She did wear a boot for numerous weeks

## 2015-06-19 NOTE — Progress Notes (Signed)
Patient: Kelli Williams, Female    DOB: 09/19/1926, 81 y.o.   MRN: [id number] Visit Date: 06/19/2015  Today's Provider: Halina Maidens, MD   Chief Complaint  Patient presents with  . Medicare Wellness  . Hypertension   Subjective:    Annual wellness visit Kelli Williams is a 81 y.o. female who presents today for her Subsequent Annual Wellness Visit. She feels fairly well. She reports exercising none due to balance problems. She reports she is sleeping fairly well.  She has been released by Oncology - will continue to need annual mammograms She is still having balance issues related to her knees and feet.  She has an appointment with Orthopedics to further evaluate.  She was unable to get the Rollator - none of the medical supply stores will bill Medicare.  ----------------------------------------------------------- Hypertension Associated symptoms include palpitations. Pertinent negatives include no chest pain, headaches or shortness of breath. There are no associated agents to hypertension. There are no known risk factors for coronary artery disease. Past treatments include ACE inhibitors, diuretics and beta blockers. The current treatment provides moderate improvement. There are no compliance problems.     Review of Systems  Constitutional: Negative for fever, appetite change, fatigue and unexpected weight change.  HENT: Negative for ear pain, hearing loss, trouble swallowing and voice change.   Eyes: Positive for discharge. Negative for visual disturbance.  Respiratory: Negative for cough, chest tightness and shortness of breath.   Cardiovascular: Positive for palpitations and leg swelling. Negative for chest pain.  Gastrointestinal: Positive for diarrhea and constipation. Negative for nausea and abdominal pain.  Genitourinary: Negative for dysuria, frequency and hematuria.  Musculoskeletal: Positive for arthralgias and gait problem.  Skin: Positive for rash.    Allergic/Immunologic: Negative for environmental allergies.  Neurological: Negative for dizziness, syncope, light-headedness and headaches.  Hematological: Negative for adenopathy.  Psychiatric/Behavioral: Negative for sleep disturbance, dysphoric mood and decreased concentration.    History   Social History  . Marital Status: Widowed    Spouse Name: N/A  . Number of Children: N/A  . Years of Education: N/A   Occupational History  . Not on file.   Social History Main Topics  . Smoking status: Never Smoker   . Smokeless tobacco: Never Used  . Alcohol Use: No  . Drug Use: No  . Sexual Activity: Not on file   Other Topics Concern  . Not on file   Social History Narrative   ** Merged History Encounter **        Patient Active Problem List   Diagnosis Date Noted  . Neuropathy involving both lower extremities 06/19/2015  . Idiopathic peripheral neuropathy 06/01/2015  . Secondary localized osteoarthrosis, ankle and foot 06/01/2015  . Acne erythematosa 06/01/2015  . Avitaminosis D 06/01/2015  . Atrial tachycardia 08/24/2014  . Visit for preventive health examination 08/24/2013  . Essential hypertension, malignant 08/17/2012  . Adjustment disorder 08/17/2012  . History of breast cancer 08/17/2012    Past Surgical History  Procedure Laterality Date  . Partial knee arthroplasty      right  . Total vaginal hysterectomy    . Breast lumpectomy      left  . Cataract extraction      left eye  . Abdominal hysterectomy    . Gallbladder surgery    . Breast lumpectomy    . Right knee replacement      Her family history includes Heart attack in her paternal uncle; Heart disease in her mother.  Previous Medications   ASPIRIN 81 MG TABLET    Take 1 tablet by mouth daily.   B COMPLEX-FOLIC ACID (BENFOTIAMINE MULTI-B) CAPS    Take 1 tablet by mouth daily.   BENFOTIAMINE PO    Take by mouth.   CHOLECALCIFEROL 1000 UNITS CAPSULE    Take 1 capsule by mouth daily.    COENZYME Q10 (CO Q-10) 100 MG CAPS    Take by mouth daily.   DOCOSAHEXAENOIC ACID (DHA OMEGA 3 PO)    Take 180 mg by mouth daily.   LISINOPRIL-HYDROCHLOROTHIAZIDE (PRINZIDE,ZESTORETIC) 20-25 MG PER TABLET    Take 1 tablet by mouth daily.   MULTIPLE VITAMINS-MINERALS (MULTIVITAMIN WITH MINERALS) TABLET    Take 1 tablet by mouth daily.   PROPRANOLOL (INDERAL) 10 MG TABLET        Patient Care Team: Glean Hess, MD as PCP - General (Family Medicine) [name], MD as Consulting Physician (Cardiology) Forest Gleason, MD (Oncology) Jannet Mantis, MD (Dermatology) Wylene Simmer, MD as Consulting Physician (Orthopedic Surgery)     Objective:   Vitals: BP 134/70 mmHg  Pulse 64  Ht 5\' 4"  (1.626 m)  Wt 186 lb 9.6 oz (84.641 kg)  BMI 32.01 kg/m2  Physical Exam  Constitutional: She is oriented to person, place, and time. She appears well-developed and well-nourished. No distress.  HENT:  Head: Normocephalic.  Right Ear: Tympanic membrane and ear canal normal.  Left Ear: Tympanic membrane and ear canal normal.  Nose: Right sinus exhibits no maxillary sinus tenderness. Left sinus exhibits no maxillary sinus tenderness.  Mouth/Throat: Uvula is midline and oropharynx is clear and moist.  Eyes: Right eye exhibits chemosis. Right eye exhibits no discharge. Left eye exhibits chemosis. Left eye exhibits no discharge.  Neck: Neck supple. No tracheal tenderness present. Carotid bruit is not present. No thyromegaly present.  Cardiovascular: Normal rate, regular rhythm and S1 normal.  Frequent extrasystoles are present. Exam reveals decreased pulses.   Pulmonary/Chest: Effort normal and breath sounds normal. She has no wheezes. Right breast exhibits no mass, no nipple discharge, no skin change and no tenderness. Left breast exhibits skin change and tenderness. Left breast exhibits no nipple discharge.    Abdominal: Soft. Normal appearance and bowel sounds are normal. There is no  hepatosplenomegaly. There is no tenderness.  Musculoskeletal: She exhibits edema.       Right knee: She exhibits decreased range of motion and swelling. She exhibits no effusion.       Left knee: She exhibits decreased range of motion and swelling. She exhibits no effusion.  Lymphadenopathy:    She has no cervical adenopathy.    She has no axillary adenopathy.  Neurological: She is alert and oriented to person, place, and time. She has normal reflexes.  Skin: Skin is warm and dry. There is erythema (facial).  Psychiatric: She has a normal mood and affect.    Activities of Daily Living In your present state of health, do you have any difficulty performing the following activities: 06/19/2015  Hearing? N  Vision? N  Difficulty concentrating or making decisions? N  Walking or climbing stairs? Y  Dressing or bathing? N  Doing errands, shopping? N    Fall Risk Assessment Fall Risk  06/19/2015  Falls in the past year? No     Patient reports there are safety devices in place in shower at home.   Depression Screen PHQ 2/9 Scores 06/19/2015  PHQ - 2 Score 1    Cognitive Testing -  6-CIT   Correct? Score   What year is it? yes 0 Yes = 0    No = 4  What month is it? yes 0 Yes = 0    No = 3  Remember:     Pia Mau, [place], [place]     What time is it? yes 0 Yes = 0    No = 3  Count backwards from 20 to 1 yes 0 Correct = 0    1 error = 2   More than 1 error = 4  Say the months of the year in reverse. yes 0 Correct = 0    1 error = 2   More than 1 error = 4  What address did I ask you to remember? yes 0 Correct = 0  1 error = 2    2 error = 4    3 error = 6    4 error = 8    All wrong = 10       TOTAL SCORE  0/28   Interpretation:  Normal  Normal (0-7) Abnormal (8-28)        Assessment & Plan:     Annual Wellness Visit  Reviewed patient's Family Medical History Reviewed and updated list of patient's medical providers Assessment of cognitive impairment was  done Assessed patient's functional ability Established a written schedule for health screening [place] Completed and Reviewed  Exercise Activities and Dietary recommendations Goals    None      Immunization History  Administered Date(s) Administered  . Pneumococcal Conjugate-13 09/08/2014  . Pneumococcal-Unspecified 09/14/1994  . Tdap 06/09/2011  . Zoster 05/12/2006    Health Maintenance  Topic Date Due  . COLONOSCOPY  12/31/1977  . DEXA SCAN  12/31/1992  . INFLUENZA VACCINE  07/10/2015  . TETANUS/TDAP  06/08/2021  . ZOSTAVAX  Completed  . PNA vac Low Risk Adult  Completed     Discussed health benefits of physical activity, and encouraged her to engage in regular exercise appropriate for her age and condition.    ------------------------------------------------------------------------------------------------------------ 1. Annual physical exam MAW completed  2. Essential hypertension, malignant controlled - CBC with Differential/Platelet - Comprehensive metabolic panel - POCT urinalysis dipstick  3. Neuropathy involving both lower extremities stable - gabapentin (NEURONTIN) 300 MG capsule; Take 2 capsules (600 mg total) by mouth 2 (two) times daily.  Dispense: 360 capsule; Refill: 3 - TSH  4. Acne erythematosa Continue tetracycline  5. Avitaminosis D Continue supplement - Vitamin D 1,25 dihydroxy  6. History of breast cancer Continue annual mammograms   Halina Maidens, MD [facility]  06/19/2015

## 2015-06-19 NOTE — Assessment & Plan Note (Signed)
Heart rate is 49 bpm on today's visit. Recommended she hold the propranolol, take only as needed for breakthrough tachycardia or palpitations

## 2015-06-20 LAB — CBC WITH DIFFERENTIAL/PLATELET
Basophils Absolute: 0 10*3/uL (ref 0.0–0.2)
Basos: 1 %
EOS (ABSOLUTE): 0.3 10*3/uL (ref 0.0–0.4)
Eos: 4 %
Hematocrit: 42.8 % (ref 34.0–46.6)
Hemoglobin: 13.9 g/dL (ref 11.1–15.9)
Immature Grans (Abs): 0 10*3/uL (ref 0.0–0.1)
Immature Granulocytes: 0 %
Lymphocytes Absolute: 1.5 10*3/uL (ref 0.7–3.1)
Lymphs: 24 %
MCH: 28.3 pg (ref 26.6–33.0)
MCHC: 32.5 g/dL (ref 31.5–35.7)
MCV: 87 fL (ref 79–97)
Monocytes Absolute: 0.5 10*3/uL (ref 0.1–0.9)
Monocytes: 7 %
Neutrophils Absolute: 4.2 10*3/uL (ref 1.4–7.0)
Neutrophils: 64 %
Platelets: 248 10*3/uL (ref 150–379)
RBC: 4.91 x10E6/uL (ref 3.77–5.28)
RDW: 15.3 % (ref 12.3–15.4)
WBC: 6.5 10*3/uL (ref 3.4–10.8)

## 2015-06-20 LAB — COMPREHENSIVE METABOLIC PANEL
ALT: 13 IU/L (ref 0–32)
AST: 21 IU/L (ref 0–40)
Albumin/Globulin Ratio: 1.4 (ref 1.1–2.5)
Albumin: 4.2 g/dL (ref 3.5–4.7)
Alkaline Phosphatase: 100 IU/L (ref 39–117)
BUN/Creatinine Ratio: 25 (ref 11–26)
BUN: 16 mg/dL (ref 8–27)
Bilirubin Total: 0.6 mg/dL (ref 0.0–1.2)
CO2: 27 mmol/L (ref 18–29)
Calcium: 9.7 mg/dL (ref 8.7–10.3)
Chloride: 95 mmol/L — ABNORMAL LOW (ref 97–108)
Creatinine, Ser: 0.65 mg/dL (ref 0.57–1.00)
GFR calc Af Amer: 92 mL/min/{1.73_m2} (ref >59)
GFR calc non Af Amer: 80 mL/min/{1.73_m2} (ref >59)
Globulin, Total: 2.9 g/dL (ref 1.5–4.5)
Glucose: 75 mg/dL (ref 65–99)
Potassium: 3.6 mmol/L (ref 3.5–5.2)
Sodium: 140 mmol/L (ref 134–144)
Total Protein: 7.1 g/dL (ref 6.0–8.5)

## 2015-06-20 LAB — TSH: TSH: 2.18 u[IU]/mL (ref 0.450–4.500)

## 2015-06-23 DIAGNOSIS — M19072 Primary osteoarthritis, left ankle and foot: Secondary | ICD-10-CM | POA: Diagnosis not present

## 2015-06-23 DIAGNOSIS — M19071 Primary osteoarthritis, right ankle and foot: Secondary | ICD-10-CM | POA: Diagnosis not present

## 2015-06-25 LAB — VITAMIN D 1,25 DIHYDROXY
Vitamin D 1, 25 (OH)2 Total: 46 pg/mL
Vitamin D2 1, 25 (OH)2: <10 pg/mL
Vitamin D3 1, 25 (OH)2: 46 pg/mL

## 2015-06-27 DIAGNOSIS — C44319 Basal cell carcinoma of skin of other parts of face: Secondary | ICD-10-CM | POA: Diagnosis not present

## 2015-06-27 DIAGNOSIS — L218 Other seborrheic dermatitis: Secondary | ICD-10-CM | POA: Diagnosis not present

## 2015-06-27 DIAGNOSIS — D485 Neoplasm of uncertain behavior of skin: Secondary | ICD-10-CM | POA: Diagnosis not present

## 2015-06-27 DIAGNOSIS — Z1283 Encounter for screening for malignant neoplasm of skin: Secondary | ICD-10-CM | POA: Diagnosis not present

## 2015-06-27 DIAGNOSIS — L821 Other seborrheic keratosis: Secondary | ICD-10-CM | POA: Diagnosis not present

## 2015-06-27 DIAGNOSIS — Z85828 Personal history of other malignant neoplasm of skin: Secondary | ICD-10-CM | POA: Diagnosis not present

## 2015-06-27 DIAGNOSIS — Z08 Encounter for follow-up examination after completed treatment for malignant neoplasm: Secondary | ICD-10-CM | POA: Diagnosis not present

## 2015-07-10 DIAGNOSIS — C44319 Basal cell carcinoma of skin of other parts of face: Secondary | ICD-10-CM | POA: Diagnosis not present

## 2015-07-19 DIAGNOSIS — M19171 Post-traumatic osteoarthritis, right ankle and foot: Secondary | ICD-10-CM | POA: Diagnosis not present

## 2015-07-19 DIAGNOSIS — R262 Difficulty in walking, not elsewhere classified: Secondary | ICD-10-CM | POA: Diagnosis not present

## 2015-08-09 DIAGNOSIS — Z48817 Encounter for surgical aftercare following surgery on the skin and subcutaneous tissue: Secondary | ICD-10-CM | POA: Diagnosis not present

## 2015-09-02 DIAGNOSIS — Z23 Encounter for immunization: Secondary | ICD-10-CM | POA: Diagnosis not present

## 2015-10-11 ENCOUNTER — Encounter: Payer: Self-pay | Admitting: Cardiovascular Disease

## 2015-10-11 ENCOUNTER — Ambulatory Visit (INDEPENDENT_AMBULATORY_CARE_PROVIDER_SITE_OTHER): Payer: Medicare Other | Admitting: Cardiovascular Disease

## 2015-10-11 VITALS — BP 135/80 | HR 57 | Ht 65.0 in | Wt 195.2 lb

## 2015-10-11 DIAGNOSIS — I10 Essential (primary) hypertension: Secondary | ICD-10-CM

## 2015-10-11 DIAGNOSIS — I471 Supraventricular tachycardia: Secondary | ICD-10-CM | POA: Diagnosis not present

## 2015-10-11 DIAGNOSIS — Z0000 Encounter for general adult medical examination without abnormal findings: Secondary | ICD-10-CM

## 2015-10-11 DIAGNOSIS — G5793 Unspecified mononeuropathy of bilateral lower limbs: Secondary | ICD-10-CM

## 2015-10-11 DIAGNOSIS — R001 Bradycardia, unspecified: Secondary | ICD-10-CM

## 2015-10-11 DIAGNOSIS — I499 Cardiac arrhythmia, unspecified: Secondary | ICD-10-CM

## 2015-10-11 DIAGNOSIS — F4329 Adjustment disorder with other symptoms: Secondary | ICD-10-CM

## 2015-10-11 DIAGNOSIS — M7989 Other specified soft tissue disorders: Secondary | ICD-10-CM

## 2015-10-11 NOTE — Assessment & Plan Note (Signed)
Taking propranolol sparingly No significant arrhythmia

## 2015-10-11 NOTE — Patient Instructions (Signed)
You are doing well. No medication changes were made.  Please consider pool therapy, aerobic   Ask Dr. Army Melia about lyrica and cymbalta for nerve pain  Please call us if you have new issues that need to be addressed before your next appt.  Your physician wants you to follow-up in: 12 months.  You will receive a reminder letter in the mail two months in advance. If you don't receive a letter, please call our office to schedule the follow-up appointment.

## 2015-10-11 NOTE — Assessment & Plan Note (Signed)
Blood pressure is well controlled on today's visit. No changes made to the medications. 

## 2015-10-11 NOTE — Assessment & Plan Note (Signed)
Bradycardia improved by holding her propranolol. She takes is only as needed for palpitations

## 2015-10-11 NOTE — Assessment & Plan Note (Signed)
Difficult time adjusting, living alone. Weight gain, suspect depression Recommended a social environment, water aerobics

## 2015-10-11 NOTE — Progress Notes (Signed)
Patient ID: Kelli Williams, female    DOB: 1926-03-27, 81 y.o.   MRN: [id number]  HPI Comments: Kelli Williams is a very pleasant 81 year old woman with no known coronary artery disease or cardiac issues who presents for routine followup of her tachycardia/palpitations Status post radiation treatment for breast cancer in 2000 on the left with 7 weeks of radiation on a daily basis.  She lost her husband in March 2016. Still with significant adjustment disorder/depression  She lives at the [facility].   Her biggest complaint is Chronic pain in her foot following a  Fracture, walks with a cane She reports blood pressure has been relatively well controlled, typically 130s up to 734K systolic No regular exercise, weight has been trending upwards, poor diet Taking propranolol as needed for palpitations, heart rate has improved. Previous bradycardia  EKG on today's visit shows normal sinus rhythm with rate 57 bpm, no significant ST or T-wave changes  Other past medical history  She did have stress test in 2012 at the recommendation of her cardiologist at [facility] She reports having a previous fracture in her right foot, seen by podiatry and Dr. Marry Guan  Lab work from September 2013 shows total cholesterol 167, LDL 91  Allergies  Allergen Reactions  . Amitriptyline Hives  . Sulfa Antibiotics Rash  . Sulfa Antibiotics Rash    Current Outpatient Prescriptions on File Prior to Visit  Medication Sig Dispense Refill  . aspirin 81 MG tablet Take 1 tablet by mouth daily.    . B Complex-Folic Acid (BENFOTIAMINE MULTI-B) CAPS Take 1 tablet by mouth daily.    . BENFOTIAMINE PO Take by mouth.    . Cholecalciferol 1000 UNITS capsule Take 1 capsule by mouth daily.    . Coenzyme Q10 (CO Q-10) 100 MG CAPS Take by mouth daily.    . Docosahexaenoic Acid (DHA OMEGA 3 PO) Take 180 mg by mouth daily.    Marland Kitchen gabapentin (NEURONTIN) 300 MG capsule Take 2 capsules (600 mg total) by mouth 2 (two) times daily.  360 capsule 3  . lisinopril-hydrochlorothiazide (PRINZIDE,ZESTORETIC) 20-25 MG per tablet Take 1.5 tablets by mouth daily. 135 tablet 3  . Multiple Vitamins-Minerals (MULTIVITAMIN WITH MINERALS) tablet Take 1 tablet by mouth daily.    . propranolol (INDERAL) 10 MG tablet Take 10 mg by mouth 3 (three) times daily.      No current facility-administered medications on file prior to visit.    Past Medical History  Diagnosis Date  . Neuropathic pain of both legs   . Skin cancer of forehead   . Skin cancer of nose   . Cancer ([place])   . Hypertension   . Neuropathy [facility])     Past Surgical History  Procedure Laterality Date  . Partial knee arthroplasty      right  . Total vaginal hysterectomy    . Breast lumpectomy      left  . Cataract extraction      left eye  . Abdominal hysterectomy    . Gallbladder surgery    . Breast lumpectomy    . Right knee replacement      Social History  reports that she has never smoked. She has never used smokeless tobacco. She reports that she does not drink alcohol or use illicit drugs.  Family History family history includes Heart attack in her paternal uncle; Heart disease in her mother.     Review of Systems  Constitutional: Negative.   Respiratory: Negative.   Cardiovascular:  Negative.   Gastrointestinal: Negative.   Musculoskeletal: Negative.   Neurological: Negative.   Hematological: Negative.   Psychiatric/Behavioral: Negative.   All other systems reviewed and are negative.  BP 135/80 mmHg  Pulse 57  Ht 5\' 5"  (1.651 m)  Wt 195 lb 4 oz (88.565 kg)  BMI 32.49 kg/m2  Physical Exam  Constitutional: She is oriented to person, place, and time. She appears well-developed and well-nourished.  HENT:  Head: Normocephalic.  Nose: Nose normal.  Mouth/Throat: Oropharynx is clear and moist.  Eyes: Conjunctivae are normal. Pupils are equal, round, and reactive to light.  Neck: Normal range of motion. Neck supple. No JVD present.   Cardiovascular: Normal rate, regular rhythm, S1 normal, S2 normal, normal heart sounds and intact distal pulses.  Exam reveals no gallop and no friction rub.   No murmur heard. Pulmonary/Chest: Effort normal and breath sounds normal. No respiratory distress. She has no wheezes. She has no rales. She exhibits no tenderness.  Abdominal: Soft. Bowel sounds are normal. She exhibits no distension. There is no tenderness.  Musculoskeletal: Normal range of motion. She exhibits no edema or tenderness.  Lymphadenopathy:    She has no cervical adenopathy.  Neurological: She is alert and oriented to person, place, and time. Coordination normal.  Skin: Skin is warm and dry. No rash noted. No erythema.  Psychiatric: She has a normal mood and affect. Her behavior is normal. Judgment and thought content normal.    Assessment and Plan  Nursing note and vitals reviewed.

## 2015-10-11 NOTE — Assessment & Plan Note (Signed)
Continued pain despite gabapentin Unclear if she may benefit from Lyrica or Cymbalta. Suggested she discuss this with primary care

## 2015-10-11 NOTE — Assessment & Plan Note (Signed)
Likely secondary to venous insufficiency. Recommended leg elevation, compression hose

## 2015-10-11 NOTE — Assessment & Plan Note (Signed)
Much of her visit spent discussing her foot, lack of exercise, weight gain, sedentary life style, diet, slow decline Suggested she try water aerobics

## 2015-10-12 ENCOUNTER — Telehealth: Payer: Self-pay

## 2015-10-12 NOTE — Telephone Encounter (Signed)
Left message for pt to call back  °

## 2015-10-12 NOTE — Telephone Encounter (Signed)
Pt has questions regarding her medications. Propranolol. Please call.

## 2015-10-12 NOTE — Telephone Encounter (Signed)
Spoke w/ pt.  She states that propranolol is still on her med list and she wants to clarify, as she was certain that she is to be holding this.  Advised her that this is a prn med to be used only if she has palpitations or run of tachycardia. Advised her that I am updating her med list to reflect this. She is appreciative and will call back w/ any other questions or concerns.

## 2015-11-10 DIAGNOSIS — M545 Low back pain: Secondary | ICD-10-CM | POA: Diagnosis not present

## 2015-11-10 DIAGNOSIS — M25571 Pain in right ankle and joints of right foot: Secondary | ICD-10-CM | POA: Diagnosis not present

## 2015-11-10 DIAGNOSIS — M25572 Pain in left ankle and joints of left foot: Secondary | ICD-10-CM | POA: Diagnosis not present

## 2015-11-10 DIAGNOSIS — M9903 Segmental and somatic dysfunction of lumbar region: Secondary | ICD-10-CM | POA: Diagnosis not present

## 2015-11-10 DIAGNOSIS — M5136 Other intervertebral disc degeneration, lumbar region: Secondary | ICD-10-CM | POA: Diagnosis not present

## 2015-11-13 DIAGNOSIS — M5136 Other intervertebral disc degeneration, lumbar region: Secondary | ICD-10-CM | POA: Diagnosis not present

## 2015-11-13 DIAGNOSIS — M25571 Pain in right ankle and joints of right foot: Secondary | ICD-10-CM | POA: Diagnosis not present

## 2015-11-13 DIAGNOSIS — M25572 Pain in left ankle and joints of left foot: Secondary | ICD-10-CM | POA: Diagnosis not present

## 2015-11-13 DIAGNOSIS — M9903 Segmental and somatic dysfunction of lumbar region: Secondary | ICD-10-CM | POA: Diagnosis not present

## 2015-11-13 DIAGNOSIS — M545 Low back pain: Secondary | ICD-10-CM | POA: Diagnosis not present

## 2015-11-15 DIAGNOSIS — M5136 Other intervertebral disc degeneration, lumbar region: Secondary | ICD-10-CM | POA: Diagnosis not present

## 2015-11-15 DIAGNOSIS — M25571 Pain in right ankle and joints of right foot: Secondary | ICD-10-CM | POA: Diagnosis not present

## 2015-11-15 DIAGNOSIS — M25572 Pain in left ankle and joints of left foot: Secondary | ICD-10-CM | POA: Diagnosis not present

## 2015-11-15 DIAGNOSIS — M9903 Segmental and somatic dysfunction of lumbar region: Secondary | ICD-10-CM | POA: Diagnosis not present

## 2015-11-15 DIAGNOSIS — M545 Low back pain: Secondary | ICD-10-CM | POA: Diagnosis not present

## 2015-11-17 DIAGNOSIS — M545 Low back pain: Secondary | ICD-10-CM | POA: Diagnosis not present

## 2015-11-17 DIAGNOSIS — M25572 Pain in left ankle and joints of left foot: Secondary | ICD-10-CM | POA: Diagnosis not present

## 2015-11-17 DIAGNOSIS — M5136 Other intervertebral disc degeneration, lumbar region: Secondary | ICD-10-CM | POA: Diagnosis not present

## 2015-11-17 DIAGNOSIS — M25571 Pain in right ankle and joints of right foot: Secondary | ICD-10-CM | POA: Diagnosis not present

## 2015-11-17 DIAGNOSIS — M9903 Segmental and somatic dysfunction of lumbar region: Secondary | ICD-10-CM | POA: Diagnosis not present

## 2015-11-22 DIAGNOSIS — M5136 Other intervertebral disc degeneration, lumbar region: Secondary | ICD-10-CM | POA: Diagnosis not present

## 2015-11-22 DIAGNOSIS — M9903 Segmental and somatic dysfunction of lumbar region: Secondary | ICD-10-CM | POA: Diagnosis not present

## 2015-11-22 DIAGNOSIS — M25572 Pain in left ankle and joints of left foot: Secondary | ICD-10-CM | POA: Diagnosis not present

## 2015-11-22 DIAGNOSIS — M545 Low back pain: Secondary | ICD-10-CM | POA: Diagnosis not present

## 2015-11-22 DIAGNOSIS — M25571 Pain in right ankle and joints of right foot: Secondary | ICD-10-CM | POA: Diagnosis not present

## 2015-12-15 DIAGNOSIS — M5136 Other intervertebral disc degeneration, lumbar region: Secondary | ICD-10-CM | POA: Diagnosis not present

## 2015-12-15 DIAGNOSIS — M25571 Pain in right ankle and joints of right foot: Secondary | ICD-10-CM | POA: Diagnosis not present

## 2015-12-15 DIAGNOSIS — M545 Low back pain: Secondary | ICD-10-CM | POA: Diagnosis not present

## 2015-12-15 DIAGNOSIS — M25572 Pain in left ankle and joints of left foot: Secondary | ICD-10-CM | POA: Diagnosis not present

## 2015-12-15 DIAGNOSIS — M9903 Segmental and somatic dysfunction of lumbar region: Secondary | ICD-10-CM | POA: Diagnosis not present

## 2015-12-20 DIAGNOSIS — M545 Low back pain: Secondary | ICD-10-CM | POA: Diagnosis not present

## 2015-12-20 DIAGNOSIS — M25572 Pain in left ankle and joints of left foot: Secondary | ICD-10-CM | POA: Diagnosis not present

## 2015-12-20 DIAGNOSIS — M25571 Pain in right ankle and joints of right foot: Secondary | ICD-10-CM | POA: Diagnosis not present

## 2015-12-20 DIAGNOSIS — M5136 Other intervertebral disc degeneration, lumbar region: Secondary | ICD-10-CM | POA: Diagnosis not present

## 2015-12-20 DIAGNOSIS — M9903 Segmental and somatic dysfunction of lumbar region: Secondary | ICD-10-CM | POA: Diagnosis not present

## 2015-12-27 DIAGNOSIS — M25572 Pain in left ankle and joints of left foot: Secondary | ICD-10-CM | POA: Diagnosis not present

## 2015-12-27 DIAGNOSIS — M25571 Pain in right ankle and joints of right foot: Secondary | ICD-10-CM | POA: Diagnosis not present

## 2015-12-27 DIAGNOSIS — M5136 Other intervertebral disc degeneration, lumbar region: Secondary | ICD-10-CM | POA: Diagnosis not present

## 2015-12-27 DIAGNOSIS — M545 Low back pain: Secondary | ICD-10-CM | POA: Diagnosis not present

## 2015-12-27 DIAGNOSIS — M9903 Segmental and somatic dysfunction of lumbar region: Secondary | ICD-10-CM | POA: Diagnosis not present

## 2016-01-03 DIAGNOSIS — M5136 Other intervertebral disc degeneration, lumbar region: Secondary | ICD-10-CM | POA: Diagnosis not present

## 2016-01-03 DIAGNOSIS — M25572 Pain in left ankle and joints of left foot: Secondary | ICD-10-CM | POA: Diagnosis not present

## 2016-01-03 DIAGNOSIS — M9903 Segmental and somatic dysfunction of lumbar region: Secondary | ICD-10-CM | POA: Diagnosis not present

## 2016-01-03 DIAGNOSIS — M25571 Pain in right ankle and joints of right foot: Secondary | ICD-10-CM | POA: Diagnosis not present

## 2016-01-03 DIAGNOSIS — M545 Low back pain: Secondary | ICD-10-CM | POA: Diagnosis not present

## 2016-01-15 ENCOUNTER — Ambulatory Visit (INDEPENDENT_AMBULATORY_CARE_PROVIDER_SITE_OTHER): Payer: Medicare Other | Admitting: Internal Medicine

## 2016-01-15 ENCOUNTER — Encounter: Payer: Self-pay | Admitting: Internal Medicine

## 2016-01-15 VITALS — BP 126/78 | HR 58 | Temp 97.6°F | Ht 65.0 in | Wt 195.2 lb

## 2016-01-15 DIAGNOSIS — I10 Essential (primary) hypertension: Secondary | ICD-10-CM | POA: Diagnosis not present

## 2016-01-15 DIAGNOSIS — M19279 Secondary osteoarthritis, unspecified ankle and foot: Secondary | ICD-10-CM

## 2016-01-15 DIAGNOSIS — Z853 Personal history of malignant neoplasm of breast: Secondary | ICD-10-CM | POA: Diagnosis not present

## 2016-01-15 DIAGNOSIS — J0100 Acute maxillary sinusitis, unspecified: Secondary | ICD-10-CM | POA: Diagnosis not present

## 2016-01-15 DIAGNOSIS — E559 Vitamin D deficiency, unspecified: Secondary | ICD-10-CM | POA: Diagnosis not present

## 2016-01-15 DIAGNOSIS — Z9010 Acquired absence of unspecified breast and nipple: Secondary | ICD-10-CM | POA: Insufficient documentation

## 2016-01-15 MED ORDER — AMOXICILLIN 875 MG PO TABS: 875.0000 mg | ORAL_TABLET | Freq: Two times a day (BID) | ORAL | Status: DC

## 2016-01-15 NOTE — Progress Notes (Signed)
Date:  01/15/2016   Name:  Kelli Williams   DOB:  [birth year]   MRN:  [id number]   Chief Complaint: Cough and Foot Pain Cough This is a new problem. The current episode started in the past 7 days. The problem has been unchanged. The problem occurs hourly. The cough is productive of purulent sputum. Associated symptoms include ear congestion, myalgias, nasal congestion, postnasal drip, rhinorrhea and wheezing. Pertinent negatives include no chest pain, chills, fever, headaches or shortness of breath.  Foot Pain This is a chronic problem. The current episode started more than 1 year ago. The problem has been unchanged. Associated symptoms include arthralgias, coughing, myalgias and weakness (and balance issues). Pertinent negatives include no chest pain, chills, diaphoresis, fever or headaches. The symptoms are aggravated by walking and standing (history of fracture on right - podiatry xrayed feet - severe degenerative changes.). Treatments tried: did not respond to cortisone injections.   Balance problems - patient has very poor balance due to deconditioning as well as bilateral foot pain. She has a walker that she uses at home or sometimes a quad cane. She has a walker to use in the community as well. Her quad cane as [place] she got a prescription for her own. She resides at the [facility] and supposedly they have physical therapy there her daughter or the patient will investigate possible referral. Status post mastectomy - She has been released from oncology. She is due for 2 new mastectomy bras and is requesting a prescription. Nose bleed - she had a nosebleed bilaterally 3 days ago that lasted several hours. She is able to stop it with ice and compression. It has not recurred.    Review of Systems  Constitutional: Positive for unexpected weight change. Negative for fever, chills and diaphoresis.  HENT: Positive for nosebleeds, postnasal drip, rhinorrhea and sinus pressure.     Respiratory: Positive for cough and wheezing. Negative for shortness of breath.   Cardiovascular: Negative for chest pain, palpitations and leg swelling.  Gastrointestinal: Negative for constipation and blood in stool.  Genitourinary: Negative for dysuria.  Musculoskeletal: Positive for myalgias, back pain, arthralgias and gait problem.  Neurological: Positive for weakness (and balance issues). Negative for dizziness and headaches.    Patient Active Problem List   Diagnosis Date Noted  . Leg swelling 10/11/2015  . Neuropathy involving both lower extremities ([place]) 06/19/2015  . Bradycardia 06/19/2015  . Idiopathic peripheral neuropathy ([place]) 06/01/2015  . Secondary localized osteoarthrosis, ankle and foot 06/01/2015  . Acne erythematosa 06/01/2015  . Avitaminosis D 06/01/2015  . Atrial tachycardia ([place]) 08/24/2014  . Visit for preventive health examination 08/24/2013  . Essential hypertension, malignant 08/17/2012  . Adjustment disorder 08/17/2012  . History of breast cancer 08/17/2012    Prior to Admission medications   Medication Sig Start Date End Date Taking? Authorizing Provider  aspirin 81 MG tablet Take 1 tablet by mouth daily.   Yes Historical Provider, MD  B Complex-Folic Acid (BENFOTIAMINE MULTI-B) CAPS Take 1 tablet by mouth daily.   Yes Historical Provider, MD  BENFOTIAMINE PO Take by mouth.   Yes Historical Provider, MD  Calcium-Magnesium 500-250 MG TABS Take by mouth daily.   Yes Historical Provider, MD  Cholecalciferol 1000 UNITS capsule Take 1 capsule by mouth daily.   Yes Historical Provider, MD  Coenzyme Q10 (CO Q-10) 100 MG CAPS Take by mouth daily.   Yes Historical Provider, MD  Docosahexaenoic Acid (DHA OMEGA 3 PO) Take 180 mg by  mouth daily.   Yes Historical Provider, MD  gabapentin (NEURONTIN) 300 MG capsule Take 2 capsules (600 mg total) by mouth 2 (two) times daily. 06/19/15  Yes Glean Hess, MD  lisinopril-hydrochlorothiazide (PRINZIDE,ZESTORETIC)  20-25 MG per tablet Take 1.5 tablets by mouth daily. 06/19/15  Yes [name], MD  Multiple Vitamins-Minerals (MULTIVITAMIN WITH MINERALS) tablet Take 1 tablet by mouth daily.   Yes Historical Provider, MD  multivitamin-lutein (OCUVITE-LUTEIN) CAPS capsule Take 1 capsule by mouth daily.   Yes Historical Provider, MD  propranolol (INDERAL) 10 MG tablet Take 10 mg by mouth 3 (three) times daily as needed. For palpitations. 08/24/14  Yes Historical Provider, MD    Allergies  Allergen Reactions  . Amitriptyline Hives  . Sulfa Antibiotics Rash  . Sulfa Antibiotics Rash    Past Surgical History  Procedure Laterality Date  . Partial knee arthroplasty      right  . Total vaginal hysterectomy    . Breast lumpectomy      left  . Cataract extraction      left eye  . Abdominal hysterectomy    . Gallbladder surgery    . Breast lumpectomy    . Right knee replacement      Social History  Substance Use Topics  . Smoking status: Never Smoker   . Smokeless tobacco: Never Used  . Alcohol Use: No    Medication list has been reviewed and updated.   Physical Exam  HENT:  Right Ear: Tympanic membrane and ear canal normal.  Left Ear: Tympanic membrane and ear canal normal.  Nose: Mucosal edema present. Right sinus exhibits no frontal sinus tenderness. Left sinus exhibits maxillary sinus tenderness. Left sinus exhibits no frontal sinus tenderness.  Mouth/Throat: Uvula is midline and oropharynx is clear and moist.  Neck: No thyromegaly present.  Cardiovascular: Normal rate, regular rhythm and normal heart sounds.  Exam reveals decreased pulses.   Pulmonary/Chest: Effort normal and breath sounds normal. No respiratory distress. She has no wheezes. She has no rales.  Skin: Skin is warm and dry.  Psychiatric: She has a normal mood and affect. Her speech is normal.  Nursing note and vitals reviewed.   BP 126/78 mmHg  Pulse 58  Temp(Src) 97.6 F (36.4 C) (Oral)  Ht 5\' 5"  (1.651 m)  Wt  195 lb 3.2 oz (88.542 kg)  BMI 32.48 kg/m2  SpO2 96%  Assessment and Plan: 1. Acute maxillary sinusitis, recurrence not specified Use saline nasal spray for nasal dryness Avoid sudafed and antihistamines - amoxicillin (AMOXIL) 875 MG tablet; Take 1 tablet (875 mg total) by mouth 2 (two) times daily.  Dispense: 20 tablet; Refill: 0  2. Avitaminosis D Continue supplementation  3. Essential hypertension controlled  4. Secondary localized osteoarthrosis, ankle and foot, unspecified laterality With poor balance and persistent pain Rx for [employer] given Consider PTx if offered at the [facility] [place]. History of breast cancer Rx for 2 mastectomy bras given   Halina Maidens, MD [facility]  01/15/2016

## 2016-02-03 ENCOUNTER — Other Ambulatory Visit: Payer: Self-pay | Admitting: Internal Medicine

## 2016-04-04 ENCOUNTER — Other Ambulatory Visit: Payer: Self-pay

## 2016-04-04 ENCOUNTER — Other Ambulatory Visit: Payer: Self-pay | Admitting: Internal Medicine

## 2016-04-04 MED ORDER — GABAPENTIN 300 MG PO CAPS: 900.0000 mg | ORAL_CAPSULE | Freq: Two times a day (BID) | ORAL | Status: DC

## 2016-04-04 MED ORDER — LISINOPRIL-HYDROCHLOROTHIAZIDE 20-25 MG PO TABS
1.5000 | ORAL_TABLET | Freq: Every day | ORAL | Status: DC
Start: 2016-04-04 — End: 2017-01-16

## 2016-04-22 DIAGNOSIS — M1712 Unilateral primary osteoarthritis, left knee: Secondary | ICD-10-CM | POA: Diagnosis not present

## 2016-04-22 DIAGNOSIS — M25562 Pain in left knee: Secondary | ICD-10-CM | POA: Diagnosis not present

## 2016-04-26 DIAGNOSIS — M5117 Intervertebral disc disorders with radiculopathy, lumbosacral region: Secondary | ICD-10-CM | POA: Diagnosis not present

## 2016-04-26 DIAGNOSIS — Z9012 Acquired absence of left breast and nipple: Secondary | ICD-10-CM | POA: Diagnosis not present

## 2016-04-26 DIAGNOSIS — M19071 Primary osteoarthritis, right ankle and foot: Secondary | ICD-10-CM | POA: Diagnosis not present

## 2016-04-26 DIAGNOSIS — M160 Bilateral primary osteoarthritis of hip: Secondary | ICD-10-CM | POA: Diagnosis not present

## 2016-04-26 DIAGNOSIS — M1712 Unilateral primary osteoarthritis, left knee: Secondary | ICD-10-CM | POA: Diagnosis not present

## 2016-04-26 DIAGNOSIS — I10 Essential (primary) hypertension: Secondary | ICD-10-CM | POA: Diagnosis not present

## 2016-04-26 DIAGNOSIS — Z853 Personal history of malignant neoplasm of breast: Secondary | ICD-10-CM | POA: Diagnosis not present

## 2016-04-26 DIAGNOSIS — Z96651 Presence of right artificial knee joint: Secondary | ICD-10-CM | POA: Diagnosis not present

## 2016-04-26 DIAGNOSIS — Z9181 History of falling: Secondary | ICD-10-CM | POA: Diagnosis not present

## 2016-04-26 DIAGNOSIS — M19072 Primary osteoarthritis, left ankle and foot: Secondary | ICD-10-CM | POA: Diagnosis not present

## 2016-04-29 DIAGNOSIS — Z9181 History of falling: Secondary | ICD-10-CM | POA: Diagnosis not present

## 2016-04-29 DIAGNOSIS — Z96651 Presence of right artificial knee joint: Secondary | ICD-10-CM | POA: Diagnosis not present

## 2016-04-29 DIAGNOSIS — M1712 Unilateral primary osteoarthritis, left knee: Secondary | ICD-10-CM | POA: Diagnosis not present

## 2016-04-29 DIAGNOSIS — M19072 Primary osteoarthritis, left ankle and foot: Secondary | ICD-10-CM | POA: Diagnosis not present

## 2016-04-29 DIAGNOSIS — Z853 Personal history of malignant neoplasm of breast: Secondary | ICD-10-CM | POA: Diagnosis not present

## 2016-04-29 DIAGNOSIS — M19071 Primary osteoarthritis, right ankle and foot: Secondary | ICD-10-CM | POA: Diagnosis not present

## 2016-04-29 DIAGNOSIS — M160 Bilateral primary osteoarthritis of hip: Secondary | ICD-10-CM | POA: Diagnosis not present

## 2016-04-29 DIAGNOSIS — I10 Essential (primary) hypertension: Secondary | ICD-10-CM | POA: Diagnosis not present

## 2016-04-29 DIAGNOSIS — M5117 Intervertebral disc disorders with radiculopathy, lumbosacral region: Secondary | ICD-10-CM | POA: Diagnosis not present

## 2016-04-29 DIAGNOSIS — Z9012 Acquired absence of left breast and nipple: Secondary | ICD-10-CM | POA: Diagnosis not present

## 2016-05-01 DIAGNOSIS — I10 Essential (primary) hypertension: Secondary | ICD-10-CM | POA: Diagnosis not present

## 2016-05-01 DIAGNOSIS — M1712 Unilateral primary osteoarthritis, left knee: Secondary | ICD-10-CM | POA: Diagnosis not present

## 2016-05-01 DIAGNOSIS — M160 Bilateral primary osteoarthritis of hip: Secondary | ICD-10-CM | POA: Diagnosis not present

## 2016-05-01 DIAGNOSIS — Z9012 Acquired absence of left breast and nipple: Secondary | ICD-10-CM | POA: Diagnosis not present

## 2016-05-01 DIAGNOSIS — Z9181 History of falling: Secondary | ICD-10-CM | POA: Diagnosis not present

## 2016-05-01 DIAGNOSIS — M5117 Intervertebral disc disorders with radiculopathy, lumbosacral region: Secondary | ICD-10-CM | POA: Diagnosis not present

## 2016-05-01 DIAGNOSIS — Z853 Personal history of malignant neoplasm of breast: Secondary | ICD-10-CM | POA: Diagnosis not present

## 2016-05-01 DIAGNOSIS — Z96651 Presence of right artificial knee joint: Secondary | ICD-10-CM | POA: Diagnosis not present

## 2016-05-01 DIAGNOSIS — M19071 Primary osteoarthritis, right ankle and foot: Secondary | ICD-10-CM | POA: Diagnosis not present

## 2016-05-01 DIAGNOSIS — M19072 Primary osteoarthritis, left ankle and foot: Secondary | ICD-10-CM | POA: Diagnosis not present

## 2016-05-08 DIAGNOSIS — Z9012 Acquired absence of left breast and nipple: Secondary | ICD-10-CM | POA: Diagnosis not present

## 2016-05-08 DIAGNOSIS — M5117 Intervertebral disc disorders with radiculopathy, lumbosacral region: Secondary | ICD-10-CM | POA: Diagnosis not present

## 2016-05-08 DIAGNOSIS — M160 Bilateral primary osteoarthritis of hip: Secondary | ICD-10-CM | POA: Diagnosis not present

## 2016-05-08 DIAGNOSIS — M19071 Primary osteoarthritis, right ankle and foot: Secondary | ICD-10-CM | POA: Diagnosis not present

## 2016-05-08 DIAGNOSIS — Z96651 Presence of right artificial knee joint: Secondary | ICD-10-CM | POA: Diagnosis not present

## 2016-05-08 DIAGNOSIS — M19072 Primary osteoarthritis, left ankle and foot: Secondary | ICD-10-CM | POA: Diagnosis not present

## 2016-05-08 DIAGNOSIS — I10 Essential (primary) hypertension: Secondary | ICD-10-CM | POA: Diagnosis not present

## 2016-05-08 DIAGNOSIS — Z9181 History of falling: Secondary | ICD-10-CM | POA: Diagnosis not present

## 2016-05-08 DIAGNOSIS — Z853 Personal history of malignant neoplasm of breast: Secondary | ICD-10-CM | POA: Diagnosis not present

## 2016-05-08 DIAGNOSIS — M1712 Unilateral primary osteoarthritis, left knee: Secondary | ICD-10-CM | POA: Diagnosis not present

## 2016-05-10 DIAGNOSIS — Z9181 History of falling: Secondary | ICD-10-CM | POA: Diagnosis not present

## 2016-05-10 DIAGNOSIS — Z9012 Acquired absence of left breast and nipple: Secondary | ICD-10-CM | POA: Diagnosis not present

## 2016-05-10 DIAGNOSIS — M19071 Primary osteoarthritis, right ankle and foot: Secondary | ICD-10-CM | POA: Diagnosis not present

## 2016-05-10 DIAGNOSIS — Z853 Personal history of malignant neoplasm of breast: Secondary | ICD-10-CM | POA: Diagnosis not present

## 2016-05-10 DIAGNOSIS — Z96651 Presence of right artificial knee joint: Secondary | ICD-10-CM | POA: Diagnosis not present

## 2016-05-10 DIAGNOSIS — M1712 Unilateral primary osteoarthritis, left knee: Secondary | ICD-10-CM | POA: Diagnosis not present

## 2016-05-10 DIAGNOSIS — M19072 Primary osteoarthritis, left ankle and foot: Secondary | ICD-10-CM | POA: Diagnosis not present

## 2016-05-10 DIAGNOSIS — M160 Bilateral primary osteoarthritis of hip: Secondary | ICD-10-CM | POA: Diagnosis not present

## 2016-05-10 DIAGNOSIS — I10 Essential (primary) hypertension: Secondary | ICD-10-CM | POA: Diagnosis not present

## 2016-05-10 DIAGNOSIS — M5117 Intervertebral disc disorders with radiculopathy, lumbosacral region: Secondary | ICD-10-CM | POA: Diagnosis not present

## 2016-05-14 DIAGNOSIS — M1712 Unilateral primary osteoarthritis, left knee: Secondary | ICD-10-CM | POA: Diagnosis not present

## 2016-05-14 DIAGNOSIS — M19072 Primary osteoarthritis, left ankle and foot: Secondary | ICD-10-CM | POA: Diagnosis not present

## 2016-05-14 DIAGNOSIS — M160 Bilateral primary osteoarthritis of hip: Secondary | ICD-10-CM | POA: Diagnosis not present

## 2016-05-14 DIAGNOSIS — Z853 Personal history of malignant neoplasm of breast: Secondary | ICD-10-CM | POA: Diagnosis not present

## 2016-05-14 DIAGNOSIS — I10 Essential (primary) hypertension: Secondary | ICD-10-CM | POA: Diagnosis not present

## 2016-05-14 DIAGNOSIS — M19071 Primary osteoarthritis, right ankle and foot: Secondary | ICD-10-CM | POA: Diagnosis not present

## 2016-05-14 DIAGNOSIS — M5117 Intervertebral disc disorders with radiculopathy, lumbosacral region: Secondary | ICD-10-CM | POA: Diagnosis not present

## 2016-05-14 DIAGNOSIS — Z96651 Presence of right artificial knee joint: Secondary | ICD-10-CM | POA: Diagnosis not present

## 2016-05-14 DIAGNOSIS — Z9012 Acquired absence of left breast and nipple: Secondary | ICD-10-CM | POA: Diagnosis not present

## 2016-05-14 DIAGNOSIS — Z9181 History of falling: Secondary | ICD-10-CM | POA: Diagnosis not present

## 2016-05-16 DIAGNOSIS — Z9012 Acquired absence of left breast and nipple: Secondary | ICD-10-CM | POA: Diagnosis not present

## 2016-05-16 DIAGNOSIS — I10 Essential (primary) hypertension: Secondary | ICD-10-CM | POA: Diagnosis not present

## 2016-05-16 DIAGNOSIS — M19072 Primary osteoarthritis, left ankle and foot: Secondary | ICD-10-CM | POA: Diagnosis not present

## 2016-05-16 DIAGNOSIS — Z96651 Presence of right artificial knee joint: Secondary | ICD-10-CM | POA: Diagnosis not present

## 2016-05-16 DIAGNOSIS — M19071 Primary osteoarthritis, right ankle and foot: Secondary | ICD-10-CM | POA: Diagnosis not present

## 2016-05-16 DIAGNOSIS — Z853 Personal history of malignant neoplasm of breast: Secondary | ICD-10-CM | POA: Diagnosis not present

## 2016-05-16 DIAGNOSIS — M1712 Unilateral primary osteoarthritis, left knee: Secondary | ICD-10-CM | POA: Diagnosis not present

## 2016-05-16 DIAGNOSIS — M160 Bilateral primary osteoarthritis of hip: Secondary | ICD-10-CM | POA: Diagnosis not present

## 2016-05-16 DIAGNOSIS — M5117 Intervertebral disc disorders with radiculopathy, lumbosacral region: Secondary | ICD-10-CM | POA: Diagnosis not present

## 2016-05-16 DIAGNOSIS — Z9181 History of falling: Secondary | ICD-10-CM | POA: Diagnosis not present

## 2016-05-21 DIAGNOSIS — I10 Essential (primary) hypertension: Secondary | ICD-10-CM | POA: Diagnosis not present

## 2016-05-21 DIAGNOSIS — M5117 Intervertebral disc disorders with radiculopathy, lumbosacral region: Secondary | ICD-10-CM | POA: Diagnosis not present

## 2016-05-21 DIAGNOSIS — M19071 Primary osteoarthritis, right ankle and foot: Secondary | ICD-10-CM | POA: Diagnosis not present

## 2016-05-21 DIAGNOSIS — Z853 Personal history of malignant neoplasm of breast: Secondary | ICD-10-CM | POA: Diagnosis not present

## 2016-05-21 DIAGNOSIS — M1712 Unilateral primary osteoarthritis, left knee: Secondary | ICD-10-CM | POA: Diagnosis not present

## 2016-05-21 DIAGNOSIS — Z9012 Acquired absence of left breast and nipple: Secondary | ICD-10-CM | POA: Diagnosis not present

## 2016-05-21 DIAGNOSIS — M19072 Primary osteoarthritis, left ankle and foot: Secondary | ICD-10-CM | POA: Diagnosis not present

## 2016-05-21 DIAGNOSIS — Z9181 History of falling: Secondary | ICD-10-CM | POA: Diagnosis not present

## 2016-05-21 DIAGNOSIS — Z96651 Presence of right artificial knee joint: Secondary | ICD-10-CM | POA: Diagnosis not present

## 2016-05-21 DIAGNOSIS — M160 Bilateral primary osteoarthritis of hip: Secondary | ICD-10-CM | POA: Diagnosis not present

## 2016-05-27 DIAGNOSIS — M1712 Unilateral primary osteoarthritis, left knee: Secondary | ICD-10-CM | POA: Diagnosis not present

## 2016-05-27 DIAGNOSIS — M19072 Primary osteoarthritis, left ankle and foot: Secondary | ICD-10-CM | POA: Diagnosis not present

## 2016-05-27 DIAGNOSIS — M160 Bilateral primary osteoarthritis of hip: Secondary | ICD-10-CM | POA: Diagnosis not present

## 2016-05-27 DIAGNOSIS — Z9012 Acquired absence of left breast and nipple: Secondary | ICD-10-CM | POA: Diagnosis not present

## 2016-05-27 DIAGNOSIS — Z853 Personal history of malignant neoplasm of breast: Secondary | ICD-10-CM | POA: Diagnosis not present

## 2016-05-27 DIAGNOSIS — M5117 Intervertebral disc disorders with radiculopathy, lumbosacral region: Secondary | ICD-10-CM | POA: Diagnosis not present

## 2016-05-27 DIAGNOSIS — M19071 Primary osteoarthritis, right ankle and foot: Secondary | ICD-10-CM | POA: Diagnosis not present

## 2016-05-27 DIAGNOSIS — I10 Essential (primary) hypertension: Secondary | ICD-10-CM | POA: Diagnosis not present

## 2016-05-27 DIAGNOSIS — Z96651 Presence of right artificial knee joint: Secondary | ICD-10-CM | POA: Diagnosis not present

## 2016-05-27 DIAGNOSIS — Z9181 History of falling: Secondary | ICD-10-CM | POA: Diagnosis not present

## 2016-05-29 DIAGNOSIS — C50112 Malignant neoplasm of central portion of left female breast: Secondary | ICD-10-CM | POA: Diagnosis not present

## 2016-05-29 DIAGNOSIS — M19072 Primary osteoarthritis, left ankle and foot: Secondary | ICD-10-CM | POA: Diagnosis not present

## 2016-05-29 DIAGNOSIS — Z96651 Presence of right artificial knee joint: Secondary | ICD-10-CM | POA: Diagnosis not present

## 2016-05-29 DIAGNOSIS — Z4432 Encounter for fitting and adjustment of external left breast prosthesis: Secondary | ICD-10-CM | POA: Diagnosis not present

## 2016-05-29 DIAGNOSIS — I10 Essential (primary) hypertension: Secondary | ICD-10-CM | POA: Diagnosis not present

## 2016-05-29 DIAGNOSIS — Z853 Personal history of malignant neoplasm of breast: Secondary | ICD-10-CM | POA: Diagnosis not present

## 2016-05-29 DIAGNOSIS — Z9181 History of falling: Secondary | ICD-10-CM | POA: Diagnosis not present

## 2016-05-29 DIAGNOSIS — M160 Bilateral primary osteoarthritis of hip: Secondary | ICD-10-CM | POA: Diagnosis not present

## 2016-05-29 DIAGNOSIS — M5117 Intervertebral disc disorders with radiculopathy, lumbosacral region: Secondary | ICD-10-CM | POA: Diagnosis not present

## 2016-05-29 DIAGNOSIS — Z9012 Acquired absence of left breast and nipple: Secondary | ICD-10-CM | POA: Diagnosis not present

## 2016-05-29 DIAGNOSIS — M19071 Primary osteoarthritis, right ankle and foot: Secondary | ICD-10-CM | POA: Diagnosis not present

## 2016-05-29 DIAGNOSIS — M1712 Unilateral primary osteoarthritis, left knee: Secondary | ICD-10-CM | POA: Diagnosis not present

## 2016-06-07 DIAGNOSIS — Z9012 Acquired absence of left breast and nipple: Secondary | ICD-10-CM | POA: Diagnosis not present

## 2016-06-07 DIAGNOSIS — Z96651 Presence of right artificial knee joint: Secondary | ICD-10-CM | POA: Diagnosis not present

## 2016-06-07 DIAGNOSIS — M1712 Unilateral primary osteoarthritis, left knee: Secondary | ICD-10-CM | POA: Diagnosis not present

## 2016-06-07 DIAGNOSIS — M19071 Primary osteoarthritis, right ankle and foot: Secondary | ICD-10-CM | POA: Diagnosis not present

## 2016-06-07 DIAGNOSIS — M5117 Intervertebral disc disorders with radiculopathy, lumbosacral region: Secondary | ICD-10-CM | POA: Diagnosis not present

## 2016-06-07 DIAGNOSIS — M19072 Primary osteoarthritis, left ankle and foot: Secondary | ICD-10-CM | POA: Diagnosis not present

## 2016-06-07 DIAGNOSIS — Z853 Personal history of malignant neoplasm of breast: Secondary | ICD-10-CM | POA: Diagnosis not present

## 2016-06-07 DIAGNOSIS — Z9181 History of falling: Secondary | ICD-10-CM | POA: Diagnosis not present

## 2016-06-07 DIAGNOSIS — I10 Essential (primary) hypertension: Secondary | ICD-10-CM | POA: Diagnosis not present

## 2016-06-07 DIAGNOSIS — M160 Bilateral primary osteoarthritis of hip: Secondary | ICD-10-CM | POA: Diagnosis not present

## 2016-06-12 DIAGNOSIS — M1712 Unilateral primary osteoarthritis, left knee: Secondary | ICD-10-CM | POA: Diagnosis not present

## 2016-06-12 DIAGNOSIS — M19072 Primary osteoarthritis, left ankle and foot: Secondary | ICD-10-CM | POA: Diagnosis not present

## 2016-06-12 DIAGNOSIS — Z9181 History of falling: Secondary | ICD-10-CM | POA: Diagnosis not present

## 2016-06-12 DIAGNOSIS — M5117 Intervertebral disc disorders with radiculopathy, lumbosacral region: Secondary | ICD-10-CM | POA: Diagnosis not present

## 2016-06-12 DIAGNOSIS — Z853 Personal history of malignant neoplasm of breast: Secondary | ICD-10-CM | POA: Diagnosis not present

## 2016-06-12 DIAGNOSIS — Z96651 Presence of right artificial knee joint: Secondary | ICD-10-CM | POA: Diagnosis not present

## 2016-06-12 DIAGNOSIS — M160 Bilateral primary osteoarthritis of hip: Secondary | ICD-10-CM | POA: Diagnosis not present

## 2016-06-12 DIAGNOSIS — M19071 Primary osteoarthritis, right ankle and foot: Secondary | ICD-10-CM | POA: Diagnosis not present

## 2016-06-12 DIAGNOSIS — Z9012 Acquired absence of left breast and nipple: Secondary | ICD-10-CM | POA: Diagnosis not present

## 2016-06-12 DIAGNOSIS — I10 Essential (primary) hypertension: Secondary | ICD-10-CM | POA: Diagnosis not present

## 2016-06-14 DIAGNOSIS — M160 Bilateral primary osteoarthritis of hip: Secondary | ICD-10-CM | POA: Diagnosis not present

## 2016-06-14 DIAGNOSIS — M19071 Primary osteoarthritis, right ankle and foot: Secondary | ICD-10-CM | POA: Diagnosis not present

## 2016-06-14 DIAGNOSIS — Z853 Personal history of malignant neoplasm of breast: Secondary | ICD-10-CM | POA: Diagnosis not present

## 2016-06-14 DIAGNOSIS — Z9012 Acquired absence of left breast and nipple: Secondary | ICD-10-CM | POA: Diagnosis not present

## 2016-06-14 DIAGNOSIS — M5117 Intervertebral disc disorders with radiculopathy, lumbosacral region: Secondary | ICD-10-CM | POA: Diagnosis not present

## 2016-06-14 DIAGNOSIS — Z96651 Presence of right artificial knee joint: Secondary | ICD-10-CM | POA: Diagnosis not present

## 2016-06-14 DIAGNOSIS — M1712 Unilateral primary osteoarthritis, left knee: Secondary | ICD-10-CM | POA: Diagnosis not present

## 2016-06-14 DIAGNOSIS — M19072 Primary osteoarthritis, left ankle and foot: Secondary | ICD-10-CM | POA: Diagnosis not present

## 2016-06-14 DIAGNOSIS — I10 Essential (primary) hypertension: Secondary | ICD-10-CM | POA: Diagnosis not present

## 2016-06-14 DIAGNOSIS — Z9181 History of falling: Secondary | ICD-10-CM | POA: Diagnosis not present

## 2016-06-18 ENCOUNTER — Encounter: Payer: Self-pay | Admitting: Internal Medicine

## 2016-06-18 DIAGNOSIS — C50919 Malignant neoplasm of unspecified site of unspecified female breast: Secondary | ICD-10-CM | POA: Insufficient documentation

## 2016-06-18 DIAGNOSIS — M19072 Primary osteoarthritis, left ankle and foot: Secondary | ICD-10-CM | POA: Diagnosis not present

## 2016-06-18 DIAGNOSIS — M5117 Intervertebral disc disorders with radiculopathy, lumbosacral region: Secondary | ICD-10-CM | POA: Diagnosis not present

## 2016-06-18 DIAGNOSIS — M160 Bilateral primary osteoarthritis of hip: Secondary | ICD-10-CM | POA: Diagnosis not present

## 2016-06-18 DIAGNOSIS — Z96651 Presence of right artificial knee joint: Secondary | ICD-10-CM | POA: Diagnosis not present

## 2016-06-18 DIAGNOSIS — Z9181 History of falling: Secondary | ICD-10-CM | POA: Diagnosis not present

## 2016-06-18 DIAGNOSIS — Z9012 Acquired absence of left breast and nipple: Secondary | ICD-10-CM | POA: Diagnosis not present

## 2016-06-18 DIAGNOSIS — I10 Essential (primary) hypertension: Secondary | ICD-10-CM | POA: Diagnosis not present

## 2016-06-18 DIAGNOSIS — M1712 Unilateral primary osteoarthritis, left knee: Secondary | ICD-10-CM | POA: Diagnosis not present

## 2016-06-18 DIAGNOSIS — Z853 Personal history of malignant neoplasm of breast: Secondary | ICD-10-CM | POA: Diagnosis not present

## 2016-06-18 DIAGNOSIS — M19071 Primary osteoarthritis, right ankle and foot: Secondary | ICD-10-CM | POA: Diagnosis not present

## 2016-06-19 ENCOUNTER — Ambulatory Visit (INDEPENDENT_AMBULATORY_CARE_PROVIDER_SITE_OTHER): Payer: Medicare Other | Admitting: Internal Medicine

## 2016-06-19 ENCOUNTER — Encounter: Payer: Self-pay | Admitting: Internal Medicine

## 2016-06-19 VITALS — BP 118/76 | HR 63 | Resp 16 | Ht 65.0 in | Wt 194.0 lb

## 2016-06-19 DIAGNOSIS — M19279 Secondary osteoarthritis, unspecified ankle and foot: Secondary | ICD-10-CM | POA: Diagnosis not present

## 2016-06-19 DIAGNOSIS — I10 Essential (primary) hypertension: Secondary | ICD-10-CM | POA: Diagnosis not present

## 2016-06-19 DIAGNOSIS — R197 Diarrhea, unspecified: Secondary | ICD-10-CM

## 2016-06-19 DIAGNOSIS — R0982 Postnasal drip: Secondary | ICD-10-CM | POA: Diagnosis not present

## 2016-06-19 DIAGNOSIS — Z0000 Encounter for general adult medical examination without abnormal findings: Secondary | ICD-10-CM

## 2016-06-19 DIAGNOSIS — G5793 Unspecified mononeuropathy of bilateral lower limbs: Secondary | ICD-10-CM | POA: Diagnosis not present

## 2016-06-19 DIAGNOSIS — Z853 Personal history of malignant neoplasm of breast: Secondary | ICD-10-CM | POA: Diagnosis not present

## 2016-06-19 DIAGNOSIS — E559 Vitamin D deficiency, unspecified: Secondary | ICD-10-CM

## 2016-06-19 LAB — POCT URINALYSIS DIPSTICK
Bilirubin, UA: NEGATIVE
Blood, UA: NEGATIVE
Glucose, UA: NEGATIVE
Ketones, UA: NEGATIVE
Leukocytes, UA: NEGATIVE
Nitrite, UA: NEGATIVE
Protein, UA: NEGATIVE
Spec Grav, UA: 1.01
pH, UA: 5

## 2016-06-19 MED ORDER — MONTELUKAST SODIUM 10 MG PO TABS: 10.0000 mg | ORAL_TABLET | Freq: Every day | ORAL | Status: DC

## 2016-06-19 NOTE — Progress Notes (Signed)
Patient: Kelli Williams, Female    DOB: August 25, 1926, 82 y.o.   MRN: [id number] Visit Date: 06/19/2016  Today's Provider: Halina Maidens, MD   Chief Complaint  Patient presents with  . Medicare Wellness  . Diarrhea    Lose Bowels had accident today on way here.   Subjective:    Annual wellness visit Kelli Williams is a 82 y.o. female who presents today for her Subsequent Annual Wellness Visit. She feels fairly well. She reports exercising none. She reports she is sleeping fairly well. She denies breast mass.  She has history of breast cancer but did not get an order for mammogram this year.  ----------------------------------------------------------- Diarrhea  This is a new problem. The current episode started today. The stool consistency is described as watery. Associated symptoms include arthralgias. Pertinent negatives include no abdominal pain, chills, coughing, fever, headaches or vomiting.  Neuropathic pain - chronic in both legs and feet.  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  She on maximal Neurontin dosing with some benefit.   Allergies - she has chronic post nasal drip and clear nasal drainage.  She uses zyrtec and flonase.  She denies thick yellow or green drainage, sinus pain or headache. Tachycardia - she is on daily medication for HTN from Cardiology - and has propranolol to take as needed. She did not understand how she should use it PRN.  Review of Systems  Constitutional: Negative for fever, chills, appetite change, fatigue and unexpected weight change.  HENT: Negative for congestion, ear pain, hearing loss, tinnitus, trouble swallowing and voice change.   Eyes: Negative for visual disturbance.  Respiratory: Negative for cough, chest tightness, shortness of breath and wheezing.   Cardiovascular: Positive for leg swelling. Negative for chest pain and palpitations.  Gastrointestinal: Positive for diarrhea. Negative for nausea, vomiting, abdominal pain and constipation.    Endocrine: Negative for polydipsia and polyuria.  Genitourinary: Negative for dysuria, frequency, hematuria, vaginal bleeding, vaginal discharge and genital sores.  Musculoskeletal: Positive for arthralgias. Negative for joint swelling and gait problem.  Skin: Negative for color change and rash.  Allergic/Immunologic: Negative for environmental allergies.  Neurological: Negative for dizziness, tremors, syncope, light-headedness and headaches.  Hematological: Negative for adenopathy. Does not bruise/bleed easily.  Psychiatric/Behavioral: Negative for sleep disturbance, dysphoric mood and decreased concentration. The patient is not nervous/anxious.     Social History   Social History  . Marital Status: Widowed    Spouse Name: N/A  . Number of Children: N/A  . Years of Education: N/A   Occupational History  . Not on file.   Social History Main Topics  . Smoking status: Never Smoker   . Smokeless tobacco: Never Used  . Alcohol Use: No  . Drug Use: No  . Sexual Activity: Not on file   Other Topics Concern  . Not on file   Social History Narrative   ** Merged History Encounter **        Patient Active Problem List   Diagnosis Date Noted  . Status post mastectomy 01/15/2016  . Leg swelling 10/11/2015  . Neuropathy involving both lower extremities ([place]) 06/19/2015  . Secondary localized osteoarthrosis, ankle and foot 06/01/2015  . Acne erythematosa 06/01/2015  . Avitaminosis D 06/01/2015  . Atrial tachycardia ([place]) 08/24/2014  . Essential hypertension 08/17/2012  . Adjustment disorder 08/17/2012  . History of breast cancer 08/17/2012    Past Surgical History  Procedure Laterality Date  . Partial knee arthroplasty      right  . Total  vaginal hysterectomy    . Breast lumpectomy      left  . Cataract extraction      left eye  . Abdominal hysterectomy    . Gallbladder surgery    . Breast lumpectomy    . Right knee replacement      Her family history includes  Heart attack in her paternal uncle; Heart disease in her mother.    Previous Medications   ASPIRIN 81 MG TABLET    Take 1 tablet by mouth daily.   B COMPLEX-FOLIC ACID (BENFOTIAMINE MULTI-B) CAPS    Take 1 tablet by mouth daily.   CALCIUM-MAGNESIUM 500-250 MG TABS    Take by mouth daily.   CHOLECALCIFEROL 1000 UNITS CAPSULE    Take 1 capsule by mouth daily.   COENZYME Q10 (CO Q-10) 100 MG CAPS    Take by mouth daily.   DOCOSAHEXAENOIC ACID (DHA OMEGA 3 PO)    Take 180 mg by mouth daily.   GABAPENTIN (NEURONTIN) 300 MG CAPSULE    Take 3 capsules (900 mg total) by mouth 2 (two) times daily.   LISINOPRIL-HYDROCHLOROTHIAZIDE (PRINZIDE,ZESTORETIC) 20-25 MG TABLET    Take 1.5 tablets by mouth daily.   MULTIPLE VITAMINS-MINERALS (MULTIVITAMIN WITH MINERALS) TABLET    Take 1 tablet by mouth daily.   OMEGA-3 FATTY ACIDS (FISH OIL CONCENTRATE) 300 MG CAPS    Take by mouth.   PROPRANOLOL (INDERAL) 10 MG TABLET    Take 1 tablet by mouth up  to 3 times daily as needed    Patient Care Team: Glean Hess, MD as PCP - General (Family Medicine) [name], MD as Consulting Physician (Cardiology) Forest Gleason, MD (Oncology) Jannet Mantis, MD (Dermatology) Wylene Simmer, MD as Consulting Physician (Orthopedic Surgery)     Objective:   Vitals: BP 118/76 mmHg  Pulse 63  Resp 16  Ht 5\' 5"  (1.651 m)  Wt 194 lb (87.998 kg)  BMI 32.28 kg/m2  SpO2 98%  Physical Exam  Constitutional: She is oriented to person, place, and time. She appears well-developed. No distress.  HENT:  Head: Normocephalic and atraumatic.  Neck: Normal range of motion. Neck supple. No thyromegaly present.  Cardiovascular: Normal rate, regular rhythm and normal heart sounds.   Pulmonary/Chest: Effort normal and breath sounds normal. No respiratory distress. She has no wheezes. She has no rales.  Abdominal: Soft. Bowel sounds are normal. She exhibits no distension. There is no tenderness.  Musculoskeletal: She  exhibits edema and tenderness.  Lymphadenopathy:    She has no cervical adenopathy.  Neurological: She is alert and oriented to person, place, and time.  Skin: Skin is warm and dry. No rash noted.  Psychiatric: She has a normal mood and affect. Her behavior is normal. Thought content normal.    Activities of Daily Living In your present state of health, do you have any difficulty performing the following activities: 06/19/2016  Hearing? N  Vision? N  Difficulty concentrating or making decisions? N  Walking or climbing stairs? N  Dressing or bathing? N  Doing errands, shopping? N  Preparing Food and eating ? N  Using the Toilet? N  In the past six months, have you accidently leaked urine? Y  Do you have problems with loss of bowel control? Y  Managing your Medications? N  Managing your Finances? N  Housekeeping or managing your Housekeeping? N    Fall Risk Assessment Fall Risk  06/19/2016 06/19/2015  Falls in the past year? Yes No  Number falls in past yr: 1 -  Injury with Fall? No -  Risk for fall due to : Impaired balance/gait -  Follow up Falls prevention discussed -      Depression Screen PHQ 2/9 Scores 06/19/2016 06/19/2015  PHQ - 2 Score 0 1    Cognitive Testing - 6-CIT   Correct? Score   What year is it? yes 0 Yes = 0    No = 4  What month is it? yes 0 Yes = 0    No = 3  Remember:     [address]ia [address], [place], [place]     What time is it? yes 0 Yes = 0    No = 3  Count backwards from 20 to 1 yes 0 Correct = 0    1 error = 2   More than 1 error = 4  Say the months of the year in reverse. yes 0 Correct = 0    1 error = 2   More than 1 error = 4  What address did I ask you to remember? yes 0 Correct = 0  1 error = 2    2 error = 4    3 error = 6    4 error = 8    All wrong = 10       TOTAL SCORE  0/28   Interpretation:  Normal  Normal (0-7) Abnormal (8-28)    Medicare Annual Wellness Visit Summary:  Reviewed patient's Family Medical History Reviewed and  updated list of patient's medical providers Assessment of cognitive impairment was done Assessed patient's functional ability Established a written schedule for health screening [place] Completed and Reviewed  Exercise Activities and Dietary recommendations Goals    None      Immunization History  Administered Date(s) Administered  . Influenza-Unspecified 09/02/2015  . Pneumococcal Conjugate-13 09/08/2014  . Pneumococcal-Unspecified 09/14/1994  . Tdap 06/09/2011  . Zoster 05/12/2006    Health Maintenance  Topic Date Due  . INFLUENZA VACCINE  07/09/2016  . TETANUS/TDAP  06/08/2021  . DEXA SCAN  Completed  . ZOSTAVAX  Completed  . PNA vac Low Risk Adult  Completed     Discussed health benefits of physical activity, and encouraged her to engage in regular exercise appropriate for her age and condition.    ------------------------------------------------------------------------------------------------------------   Assessment & Plan:  1. Medicare annual wellness visit, subsequent Measures satisfied - Lipid panel - POCT urinalysis dipstick  2. Essential hypertension Controlled  - CBC with Differential/Platelet - Comprehensive metabolic panel - TSH  3. Neuropathy involving both lower extremities ([facility]) Will check B-12 - Vitamin B12  4. Secondary localized osteoarthrosis, ankle and foot, unspecified laterality Continue gabapentin Consider trial of Lyrica - pt declines for now  5. Avitaminosis D On supplementation  6. History of breast cancer Needs annual mammogram indefinitely - MM DIGITAL SCREENING BILATERAL; Future  7. Diarrhea, unspecified type Suspect a high impaction - recommend beginning a fiber supplement daily  8. Post-nasal drip Continue zyrtec and flonase Try adding singulair - montelukast (SINGULAIR) 10 MG tablet; Take 1 tablet (10 mg total) by mouth at bedtime.  Dispense: 90 tablet; Refill: 0   Halina Maidens, MD [facility]  06/19/2016

## 2016-06-19 NOTE — Patient Instructions (Addendum)
Health Maintenance  Topic Date Due  . INFLUENZA VACCINE  07/09/2016  . TETANUS/TDAP  06/08/2021  . DEXA SCAN  Completed  . ZOSTAVAX  Completed  . PNA vac Low Risk Adult  Completed    Begin taking a fiber supplement - such as FiberCon - one tablet daily with 8 oz of water or tea.   Breast Self-Awareness Practicing breast self-awareness may pick up problems early, prevent significant medical complications, and possibly save your life. By practicing breast self-awareness, you can become familiar with how your breasts look and feel and if your breasts are changing. This allows you to notice changes early. It can also offer you some reassurance that your breast health is good. One way to learn what is normal for your breasts and whether your breasts are changing is to do a breast self-exam. If you find a lump or something that was not present in the past, it is best to contact your caregiver right away. Other findings that should be evaluated by your caregiver include nipple discharge, especially if it is bloody; skin changes or reddening; areas where the skin seems to be pulled in (retracted); or new lumps and bumps. Breast pain is seldom associated with cancer (malignancy), but should also be evaluated by a caregiver. HOW TO PERFORM A BREAST SELF-EXAM The best time to examine your breasts is 5-7 days after your menstrual period is over. During menstruation, the breasts are lumpier, and it may be more difficult to pick up changes. If you do not menstruate, have reached menopause, or had your uterus removed (hysterectomy), you should examine your breasts at regular intervals, such as monthly. If you are breastfeeding, examine your breasts after a feeding or after using a breast pump. Breast implants do not decrease the risk for lumps or tumors, so continue to perform breast self-exams as recommended. Talk to your caregiver about how to determine the difference between the implant and breast tissue. Also,  talk about the amount of pressure you should use during the exam. Over time, you will become more familiar with the variations of your breasts and more comfortable with the exam. A breast self-exam requires you to remove all your clothes above the waist. 1. Look at your breasts and nipples. Stand in front of a mirror in a room with good lighting. With your hands on your hips, push your hands firmly downward. Look for a difference in shape, contour, and size from one breast to the other (asymmetry). Asymmetry includes puckers, dips, or bumps. Also, look for skin changes, such as reddened or scaly areas on the breasts. Look for nipple changes, such as discharge, dimpling, repositioning, or redness. 2. Carefully feel your breasts. This is best done either in the shower or tub while using soapy water or when flat on your back. Place the arm (on the side of the breast you are examining) above your head. Use the pads (not the fingertips) of your three middle fingers on your opposite hand to feel your breasts. Start in the underarm area and use  inch (2 cm) overlapping circles to feel your breast. Use 3 different levels of pressure (light, medium, and firm pressure) at each circle before moving to the next circle. The light pressure is needed to feel the tissue closest to the skin. The medium pressure will help to feel breast tissue a little deeper, while the firm pressure is needed to feel the tissue close to the ribs. Continue the overlapping circles, moving downward over the breast  until you feel your ribs below your breast. Then, move one finger-width towards the center of the body. Continue to use the  inch (2 cm) overlapping circles to feel your breast as you move slowly up toward the collar bone (clavicle) near the base of the neck. Continue the up and down exam using all 3 pressures until you reach the middle of the chest. Do this with each breast, carefully feeling for lumps or changes. 3.  Keep a written  record with breast changes or normal findings for each breast. By writing this information down, you do not need to depend only on memory for size, tenderness, or location. Write down where you are in your menstrual cycle, if you are still menstruating. Breast tissue can have some lumps or thick tissue. However, see your caregiver if you find anything that concerns you.  SEEK MEDICAL CARE IF:  You see a change in shape, contour, or size of your breasts or nipples.   You see skin changes, such as reddened or scaly areas on the breasts or nipples.   You have an unusual discharge from your nipples.   You feel a new lump or unusually thick areas.    This information is not intended to replace advice given to you by your health care provider. Make sure you discuss any questions you have with your health care provider.   Document Released: 11/25/2005 Document Revised: 11/11/2012 Document Reviewed: 03/11/2012 Elsevier Interactive Patient Education [employer].

## 2016-06-20 LAB — CBC WITH DIFFERENTIAL/PLATELET
Basophils Absolute: 0 10*3/uL (ref 0.0–0.2)
Basos: 1 %
EOS (ABSOLUTE): 0.4 10*3/uL (ref 0.0–0.4)
Eos: 5 %
Hematocrit: 39.4 % (ref 34.0–46.6)
Hemoglobin: 13.3 g/dL (ref 11.1–15.9)
Immature Grans (Abs): 0 10*3/uL (ref 0.0–0.1)
Immature Granulocytes: 0 %
Lymphocytes Absolute: 2 10*3/uL (ref 0.7–3.1)
Lymphs: 25 %
MCH: 29.5 pg (ref 26.6–33.0)
MCHC: 33.8 g/dL (ref 31.5–35.7)
MCV: 87 fL (ref 79–97)
Monocytes Absolute: 0.6 10*3/uL (ref 0.1–0.9)
Monocytes: 8 %
Neutrophils Absolute: 4.9 10*3/uL (ref 1.4–7.0)
Neutrophils: 61 %
Platelets: 260 10*3/uL (ref 150–379)
RBC: 4.51 x10E6/uL (ref 3.77–5.28)
RDW: 15.1 % (ref 12.3–15.4)
WBC: 7.9 10*3/uL (ref 3.4–10.8)

## 2016-06-20 LAB — TSH: TSH: 2.19 u[IU]/mL (ref 0.450–4.500)

## 2016-06-20 LAB — COMPREHENSIVE METABOLIC PANEL
ALT: 14 IU/L (ref 0–32)
AST: 23 IU/L (ref 0–40)
Albumin/Globulin Ratio: 1.4 (ref 1.2–2.2)
Albumin: 4.3 g/dL (ref 3.5–4.7)
Alkaline Phosphatase: 94 IU/L (ref 39–117)
BUN/Creatinine Ratio: 26 (ref 12–28)
BUN: 18 mg/dL (ref 8–27)
Bilirubin Total: 0.5 mg/dL (ref 0.0–1.2)
CO2: 26 mmol/L (ref 18–29)
Calcium: 9.3 mg/dL (ref 8.7–10.3)
Chloride: 99 mmol/L (ref 96–106)
Creatinine, Ser: 0.69 mg/dL (ref 0.57–1.00)
GFR calc Af Amer: 90 mL/min/{1.73_m2} (ref >59)
GFR calc non Af Amer: 78 mL/min/{1.73_m2} (ref >59)
Globulin, Total: 3.1 g/dL (ref 1.5–4.5)
Glucose: 77 mg/dL (ref 65–99)
Potassium: 4 mmol/L (ref 3.5–5.2)
Sodium: 143 mmol/L (ref 134–144)
Total Protein: 7.4 g/dL (ref 6.0–8.5)

## 2016-06-20 LAB — LIPID PANEL
Chol/HDL Ratio: 2.5 ratio units (ref 0.0–4.4)
Cholesterol, Total: 170 mg/dL (ref 100–199)
HDL: 67 mg/dL (ref >39)
LDL Calculated: 86 mg/dL (ref 0–99)
Triglycerides: 85 mg/dL (ref 0–149)
VLDL Cholesterol Cal: 17 mg/dL (ref 5–40)

## 2016-06-20 LAB — VITAMIN B12: Vitamin B-12: 461 pg/mL (ref 211–946)

## 2016-06-21 DIAGNOSIS — M19071 Primary osteoarthritis, right ankle and foot: Secondary | ICD-10-CM | POA: Diagnosis not present

## 2016-06-21 DIAGNOSIS — Z9012 Acquired absence of left breast and nipple: Secondary | ICD-10-CM | POA: Diagnosis not present

## 2016-06-21 DIAGNOSIS — M160 Bilateral primary osteoarthritis of hip: Secondary | ICD-10-CM | POA: Diagnosis not present

## 2016-06-21 DIAGNOSIS — Z9181 History of falling: Secondary | ICD-10-CM | POA: Diagnosis not present

## 2016-06-21 DIAGNOSIS — M19072 Primary osteoarthritis, left ankle and foot: Secondary | ICD-10-CM | POA: Diagnosis not present

## 2016-06-21 DIAGNOSIS — Z853 Personal history of malignant neoplasm of breast: Secondary | ICD-10-CM | POA: Diagnosis not present

## 2016-06-21 DIAGNOSIS — Z96651 Presence of right artificial knee joint: Secondary | ICD-10-CM | POA: Diagnosis not present

## 2016-06-21 DIAGNOSIS — M1712 Unilateral primary osteoarthritis, left knee: Secondary | ICD-10-CM | POA: Diagnosis not present

## 2016-06-21 DIAGNOSIS — I10 Essential (primary) hypertension: Secondary | ICD-10-CM | POA: Diagnosis not present

## 2016-06-21 DIAGNOSIS — M5117 Intervertebral disc disorders with radiculopathy, lumbosacral region: Secondary | ICD-10-CM | POA: Diagnosis not present

## 2016-06-24 DIAGNOSIS — Z9012 Acquired absence of left breast and nipple: Secondary | ICD-10-CM | POA: Diagnosis not present

## 2016-06-24 DIAGNOSIS — Z4432 Encounter for fitting and adjustment of external left breast prosthesis: Secondary | ICD-10-CM | POA: Diagnosis not present

## 2016-06-24 DIAGNOSIS — M19071 Primary osteoarthritis, right ankle and foot: Secondary | ICD-10-CM | POA: Diagnosis not present

## 2016-06-24 DIAGNOSIS — M19072 Primary osteoarthritis, left ankle and foot: Secondary | ICD-10-CM | POA: Diagnosis not present

## 2016-06-24 DIAGNOSIS — Z9181 History of falling: Secondary | ICD-10-CM | POA: Diagnosis not present

## 2016-06-24 DIAGNOSIS — M160 Bilateral primary osteoarthritis of hip: Secondary | ICD-10-CM | POA: Diagnosis not present

## 2016-06-24 DIAGNOSIS — Z96651 Presence of right artificial knee joint: Secondary | ICD-10-CM | POA: Diagnosis not present

## 2016-06-24 DIAGNOSIS — M5117 Intervertebral disc disorders with radiculopathy, lumbosacral region: Secondary | ICD-10-CM | POA: Diagnosis not present

## 2016-06-24 DIAGNOSIS — Z853 Personal history of malignant neoplasm of breast: Secondary | ICD-10-CM | POA: Diagnosis not present

## 2016-06-24 DIAGNOSIS — M1712 Unilateral primary osteoarthritis, left knee: Secondary | ICD-10-CM | POA: Diagnosis not present

## 2016-06-24 DIAGNOSIS — I10 Essential (primary) hypertension: Secondary | ICD-10-CM | POA: Diagnosis not present

## 2016-06-24 DIAGNOSIS — C50112 Malignant neoplasm of central portion of left female breast: Secondary | ICD-10-CM | POA: Diagnosis not present

## 2016-07-03 DIAGNOSIS — Z9012 Acquired absence of left breast and nipple: Secondary | ICD-10-CM | POA: Diagnosis not present

## 2016-07-03 DIAGNOSIS — Z9181 History of falling: Secondary | ICD-10-CM | POA: Diagnosis not present

## 2016-07-03 DIAGNOSIS — Z96651 Presence of right artificial knee joint: Secondary | ICD-10-CM | POA: Diagnosis not present

## 2016-07-03 DIAGNOSIS — M5117 Intervertebral disc disorders with radiculopathy, lumbosacral region: Secondary | ICD-10-CM | POA: Diagnosis not present

## 2016-07-03 DIAGNOSIS — I10 Essential (primary) hypertension: Secondary | ICD-10-CM | POA: Diagnosis not present

## 2016-07-03 DIAGNOSIS — M19071 Primary osteoarthritis, right ankle and foot: Secondary | ICD-10-CM | POA: Diagnosis not present

## 2016-07-03 DIAGNOSIS — Z853 Personal history of malignant neoplasm of breast: Secondary | ICD-10-CM | POA: Diagnosis not present

## 2016-07-03 DIAGNOSIS — M160 Bilateral primary osteoarthritis of hip: Secondary | ICD-10-CM | POA: Diagnosis not present

## 2016-07-03 DIAGNOSIS — M1712 Unilateral primary osteoarthritis, left knee: Secondary | ICD-10-CM | POA: Diagnosis not present

## 2016-07-03 DIAGNOSIS — Z7982 Long term (current) use of aspirin: Secondary | ICD-10-CM | POA: Diagnosis not present

## 2016-07-03 DIAGNOSIS — M19072 Primary osteoarthritis, left ankle and foot: Secondary | ICD-10-CM | POA: Diagnosis not present

## 2016-07-05 DIAGNOSIS — I10 Essential (primary) hypertension: Secondary | ICD-10-CM | POA: Diagnosis not present

## 2016-07-05 DIAGNOSIS — M19071 Primary osteoarthritis, right ankle and foot: Secondary | ICD-10-CM | POA: Diagnosis not present

## 2016-07-05 DIAGNOSIS — M5117 Intervertebral disc disorders with radiculopathy, lumbosacral region: Secondary | ICD-10-CM | POA: Diagnosis not present

## 2016-07-05 DIAGNOSIS — Z9181 History of falling: Secondary | ICD-10-CM | POA: Diagnosis not present

## 2016-07-05 DIAGNOSIS — Z96651 Presence of right artificial knee joint: Secondary | ICD-10-CM | POA: Diagnosis not present

## 2016-07-05 DIAGNOSIS — Z7982 Long term (current) use of aspirin: Secondary | ICD-10-CM | POA: Diagnosis not present

## 2016-07-05 DIAGNOSIS — M19072 Primary osteoarthritis, left ankle and foot: Secondary | ICD-10-CM | POA: Diagnosis not present

## 2016-07-05 DIAGNOSIS — Z853 Personal history of malignant neoplasm of breast: Secondary | ICD-10-CM | POA: Diagnosis not present

## 2016-07-05 DIAGNOSIS — M1712 Unilateral primary osteoarthritis, left knee: Secondary | ICD-10-CM | POA: Diagnosis not present

## 2016-07-05 DIAGNOSIS — M160 Bilateral primary osteoarthritis of hip: Secondary | ICD-10-CM | POA: Diagnosis not present

## 2016-07-05 DIAGNOSIS — Z9012 Acquired absence of left breast and nipple: Secondary | ICD-10-CM | POA: Diagnosis not present

## 2016-07-12 DIAGNOSIS — Z96651 Presence of right artificial knee joint: Secondary | ICD-10-CM | POA: Diagnosis not present

## 2016-07-12 DIAGNOSIS — M19071 Primary osteoarthritis, right ankle and foot: Secondary | ICD-10-CM | POA: Diagnosis not present

## 2016-07-12 DIAGNOSIS — Z7982 Long term (current) use of aspirin: Secondary | ICD-10-CM | POA: Diagnosis not present

## 2016-07-12 DIAGNOSIS — Z853 Personal history of malignant neoplasm of breast: Secondary | ICD-10-CM | POA: Diagnosis not present

## 2016-07-12 DIAGNOSIS — M19072 Primary osteoarthritis, left ankle and foot: Secondary | ICD-10-CM | POA: Diagnosis not present

## 2016-07-12 DIAGNOSIS — I10 Essential (primary) hypertension: Secondary | ICD-10-CM | POA: Diagnosis not present

## 2016-07-12 DIAGNOSIS — M160 Bilateral primary osteoarthritis of hip: Secondary | ICD-10-CM | POA: Diagnosis not present

## 2016-07-12 DIAGNOSIS — Z9181 History of falling: Secondary | ICD-10-CM | POA: Diagnosis not present

## 2016-07-12 DIAGNOSIS — M5117 Intervertebral disc disorders with radiculopathy, lumbosacral region: Secondary | ICD-10-CM | POA: Diagnosis not present

## 2016-07-12 DIAGNOSIS — Z9012 Acquired absence of left breast and nipple: Secondary | ICD-10-CM | POA: Diagnosis not present

## 2016-07-12 DIAGNOSIS — M1712 Unilateral primary osteoarthritis, left knee: Secondary | ICD-10-CM | POA: Diagnosis not present

## 2016-07-17 DIAGNOSIS — L821 Other seborrheic keratosis: Secondary | ICD-10-CM | POA: Diagnosis not present

## 2016-07-17 DIAGNOSIS — M79671 Pain in right foot: Secondary | ICD-10-CM | POA: Diagnosis not present

## 2016-07-17 DIAGNOSIS — D485 Neoplasm of uncertain behavior of skin: Secondary | ICD-10-CM | POA: Diagnosis not present

## 2016-07-17 DIAGNOSIS — L820 Inflamed seborrheic keratosis: Secondary | ICD-10-CM | POA: Diagnosis not present

## 2016-07-17 DIAGNOSIS — B009 Herpesviral infection, unspecified: Secondary | ICD-10-CM | POA: Diagnosis not present

## 2016-07-19 DIAGNOSIS — Z7982 Long term (current) use of aspirin: Secondary | ICD-10-CM | POA: Diagnosis not present

## 2016-07-19 DIAGNOSIS — M19071 Primary osteoarthritis, right ankle and foot: Secondary | ICD-10-CM | POA: Diagnosis not present

## 2016-07-19 DIAGNOSIS — M5117 Intervertebral disc disorders with radiculopathy, lumbosacral region: Secondary | ICD-10-CM | POA: Diagnosis not present

## 2016-07-19 DIAGNOSIS — Z853 Personal history of malignant neoplasm of breast: Secondary | ICD-10-CM | POA: Diagnosis not present

## 2016-07-19 DIAGNOSIS — Z9012 Acquired absence of left breast and nipple: Secondary | ICD-10-CM | POA: Diagnosis not present

## 2016-07-19 DIAGNOSIS — M19072 Primary osteoarthritis, left ankle and foot: Secondary | ICD-10-CM | POA: Diagnosis not present

## 2016-07-19 DIAGNOSIS — I10 Essential (primary) hypertension: Secondary | ICD-10-CM | POA: Diagnosis not present

## 2016-07-19 DIAGNOSIS — M160 Bilateral primary osteoarthritis of hip: Secondary | ICD-10-CM | POA: Diagnosis not present

## 2016-07-19 DIAGNOSIS — Z9181 History of falling: Secondary | ICD-10-CM | POA: Diagnosis not present

## 2016-07-19 DIAGNOSIS — M1712 Unilateral primary osteoarthritis, left knee: Secondary | ICD-10-CM | POA: Diagnosis not present

## 2016-07-19 DIAGNOSIS — Z96651 Presence of right artificial knee joint: Secondary | ICD-10-CM | POA: Diagnosis not present

## 2016-07-22 DIAGNOSIS — M19072 Primary osteoarthritis, left ankle and foot: Secondary | ICD-10-CM | POA: Diagnosis not present

## 2016-07-22 DIAGNOSIS — I10 Essential (primary) hypertension: Secondary | ICD-10-CM | POA: Diagnosis not present

## 2016-07-22 DIAGNOSIS — Z9181 History of falling: Secondary | ICD-10-CM | POA: Diagnosis not present

## 2016-07-22 DIAGNOSIS — M19071 Primary osteoarthritis, right ankle and foot: Secondary | ICD-10-CM | POA: Diagnosis not present

## 2016-07-22 DIAGNOSIS — Z9012 Acquired absence of left breast and nipple: Secondary | ICD-10-CM | POA: Diagnosis not present

## 2016-07-22 DIAGNOSIS — M160 Bilateral primary osteoarthritis of hip: Secondary | ICD-10-CM | POA: Diagnosis not present

## 2016-07-22 DIAGNOSIS — M5117 Intervertebral disc disorders with radiculopathy, lumbosacral region: Secondary | ICD-10-CM | POA: Diagnosis not present

## 2016-07-22 DIAGNOSIS — Z96651 Presence of right artificial knee joint: Secondary | ICD-10-CM | POA: Diagnosis not present

## 2016-07-22 DIAGNOSIS — Z7982 Long term (current) use of aspirin: Secondary | ICD-10-CM | POA: Diagnosis not present

## 2016-07-22 DIAGNOSIS — Z853 Personal history of malignant neoplasm of breast: Secondary | ICD-10-CM | POA: Diagnosis not present

## 2016-07-22 DIAGNOSIS — M1712 Unilateral primary osteoarthritis, left knee: Secondary | ICD-10-CM | POA: Diagnosis not present

## 2016-07-24 DIAGNOSIS — M1712 Unilateral primary osteoarthritis, left knee: Secondary | ICD-10-CM | POA: Diagnosis not present

## 2016-07-24 DIAGNOSIS — Z853 Personal history of malignant neoplasm of breast: Secondary | ICD-10-CM | POA: Diagnosis not present

## 2016-07-24 DIAGNOSIS — M19071 Primary osteoarthritis, right ankle and foot: Secondary | ICD-10-CM | POA: Diagnosis not present

## 2016-07-24 DIAGNOSIS — Z96651 Presence of right artificial knee joint: Secondary | ICD-10-CM | POA: Diagnosis not present

## 2016-07-24 DIAGNOSIS — Z9012 Acquired absence of left breast and nipple: Secondary | ICD-10-CM | POA: Diagnosis not present

## 2016-07-24 DIAGNOSIS — M160 Bilateral primary osteoarthritis of hip: Secondary | ICD-10-CM | POA: Diagnosis not present

## 2016-07-24 DIAGNOSIS — Z7982 Long term (current) use of aspirin: Secondary | ICD-10-CM | POA: Diagnosis not present

## 2016-07-24 DIAGNOSIS — M19072 Primary osteoarthritis, left ankle and foot: Secondary | ICD-10-CM | POA: Diagnosis not present

## 2016-07-24 DIAGNOSIS — I10 Essential (primary) hypertension: Secondary | ICD-10-CM | POA: Diagnosis not present

## 2016-07-24 DIAGNOSIS — Z9181 History of falling: Secondary | ICD-10-CM | POA: Diagnosis not present

## 2016-07-24 DIAGNOSIS — M5117 Intervertebral disc disorders with radiculopathy, lumbosacral region: Secondary | ICD-10-CM | POA: Diagnosis not present

## 2016-07-29 DIAGNOSIS — Z9012 Acquired absence of left breast and nipple: Secondary | ICD-10-CM | POA: Diagnosis not present

## 2016-07-29 DIAGNOSIS — Z96651 Presence of right artificial knee joint: Secondary | ICD-10-CM | POA: Diagnosis not present

## 2016-07-29 DIAGNOSIS — I10 Essential (primary) hypertension: Secondary | ICD-10-CM | POA: Diagnosis not present

## 2016-07-29 DIAGNOSIS — Z9181 History of falling: Secondary | ICD-10-CM | POA: Diagnosis not present

## 2016-07-29 DIAGNOSIS — M19072 Primary osteoarthritis, left ankle and foot: Secondary | ICD-10-CM | POA: Diagnosis not present

## 2016-07-29 DIAGNOSIS — M19071 Primary osteoarthritis, right ankle and foot: Secondary | ICD-10-CM | POA: Diagnosis not present

## 2016-07-29 DIAGNOSIS — M160 Bilateral primary osteoarthritis of hip: Secondary | ICD-10-CM | POA: Diagnosis not present

## 2016-07-29 DIAGNOSIS — Z7982 Long term (current) use of aspirin: Secondary | ICD-10-CM | POA: Diagnosis not present

## 2016-07-29 DIAGNOSIS — Z853 Personal history of malignant neoplasm of breast: Secondary | ICD-10-CM | POA: Diagnosis not present

## 2016-07-29 DIAGNOSIS — M5117 Intervertebral disc disorders with radiculopathy, lumbosacral region: Secondary | ICD-10-CM | POA: Diagnosis not present

## 2016-07-29 DIAGNOSIS — M1712 Unilateral primary osteoarthritis, left knee: Secondary | ICD-10-CM | POA: Diagnosis not present

## 2016-07-31 DIAGNOSIS — I10 Essential (primary) hypertension: Secondary | ICD-10-CM | POA: Diagnosis not present

## 2016-07-31 DIAGNOSIS — Z7982 Long term (current) use of aspirin: Secondary | ICD-10-CM | POA: Diagnosis not present

## 2016-07-31 DIAGNOSIS — Z9012 Acquired absence of left breast and nipple: Secondary | ICD-10-CM | POA: Diagnosis not present

## 2016-07-31 DIAGNOSIS — Z9181 History of falling: Secondary | ICD-10-CM | POA: Diagnosis not present

## 2016-07-31 DIAGNOSIS — Z96651 Presence of right artificial knee joint: Secondary | ICD-10-CM | POA: Diagnosis not present

## 2016-07-31 DIAGNOSIS — M160 Bilateral primary osteoarthritis of hip: Secondary | ICD-10-CM | POA: Diagnosis not present

## 2016-07-31 DIAGNOSIS — M19071 Primary osteoarthritis, right ankle and foot: Secondary | ICD-10-CM | POA: Diagnosis not present

## 2016-07-31 DIAGNOSIS — M1712 Unilateral primary osteoarthritis, left knee: Secondary | ICD-10-CM | POA: Diagnosis not present

## 2016-07-31 DIAGNOSIS — Z853 Personal history of malignant neoplasm of breast: Secondary | ICD-10-CM | POA: Diagnosis not present

## 2016-07-31 DIAGNOSIS — M5117 Intervertebral disc disorders with radiculopathy, lumbosacral region: Secondary | ICD-10-CM | POA: Diagnosis not present

## 2016-07-31 DIAGNOSIS — M19072 Primary osteoarthritis, left ankle and foot: Secondary | ICD-10-CM | POA: Diagnosis not present

## 2016-08-05 DIAGNOSIS — Z9012 Acquired absence of left breast and nipple: Secondary | ICD-10-CM | POA: Diagnosis not present

## 2016-08-05 DIAGNOSIS — M5117 Intervertebral disc disorders with radiculopathy, lumbosacral region: Secondary | ICD-10-CM | POA: Diagnosis not present

## 2016-08-05 DIAGNOSIS — I10 Essential (primary) hypertension: Secondary | ICD-10-CM | POA: Diagnosis not present

## 2016-08-05 DIAGNOSIS — Z7982 Long term (current) use of aspirin: Secondary | ICD-10-CM | POA: Diagnosis not present

## 2016-08-05 DIAGNOSIS — M19071 Primary osteoarthritis, right ankle and foot: Secondary | ICD-10-CM | POA: Diagnosis not present

## 2016-08-05 DIAGNOSIS — M1712 Unilateral primary osteoarthritis, left knee: Secondary | ICD-10-CM | POA: Diagnosis not present

## 2016-08-05 DIAGNOSIS — Z853 Personal history of malignant neoplasm of breast: Secondary | ICD-10-CM | POA: Diagnosis not present

## 2016-08-05 DIAGNOSIS — Z96651 Presence of right artificial knee joint: Secondary | ICD-10-CM | POA: Diagnosis not present

## 2016-08-05 DIAGNOSIS — M160 Bilateral primary osteoarthritis of hip: Secondary | ICD-10-CM | POA: Diagnosis not present

## 2016-08-05 DIAGNOSIS — M19072 Primary osteoarthritis, left ankle and foot: Secondary | ICD-10-CM | POA: Diagnosis not present

## 2016-08-05 DIAGNOSIS — Z9181 History of falling: Secondary | ICD-10-CM | POA: Diagnosis not present

## 2016-08-07 ENCOUNTER — Other Ambulatory Visit: Payer: Self-pay | Admitting: Internal Medicine

## 2016-08-07 DIAGNOSIS — Z9181 History of falling: Secondary | ICD-10-CM | POA: Diagnosis not present

## 2016-08-07 DIAGNOSIS — M19071 Primary osteoarthritis, right ankle and foot: Secondary | ICD-10-CM | POA: Diagnosis not present

## 2016-08-07 DIAGNOSIS — R0982 Postnasal drip: Secondary | ICD-10-CM

## 2016-08-07 DIAGNOSIS — M5117 Intervertebral disc disorders with radiculopathy, lumbosacral region: Secondary | ICD-10-CM | POA: Diagnosis not present

## 2016-08-07 DIAGNOSIS — Z9012 Acquired absence of left breast and nipple: Secondary | ICD-10-CM | POA: Diagnosis not present

## 2016-08-07 DIAGNOSIS — M19072 Primary osteoarthritis, left ankle and foot: Secondary | ICD-10-CM | POA: Diagnosis not present

## 2016-08-07 DIAGNOSIS — M160 Bilateral primary osteoarthritis of hip: Secondary | ICD-10-CM | POA: Diagnosis not present

## 2016-08-07 DIAGNOSIS — Z96651 Presence of right artificial knee joint: Secondary | ICD-10-CM | POA: Diagnosis not present

## 2016-08-07 DIAGNOSIS — I10 Essential (primary) hypertension: Secondary | ICD-10-CM | POA: Diagnosis not present

## 2016-08-07 DIAGNOSIS — M1712 Unilateral primary osteoarthritis, left knee: Secondary | ICD-10-CM | POA: Diagnosis not present

## 2016-08-07 DIAGNOSIS — Z853 Personal history of malignant neoplasm of breast: Secondary | ICD-10-CM | POA: Diagnosis not present

## 2016-08-07 DIAGNOSIS — Z7982 Long term (current) use of aspirin: Secondary | ICD-10-CM | POA: Diagnosis not present

## 2016-08-16 DIAGNOSIS — Z9012 Acquired absence of left breast and nipple: Secondary | ICD-10-CM | POA: Diagnosis not present

## 2016-08-16 DIAGNOSIS — M160 Bilateral primary osteoarthritis of hip: Secondary | ICD-10-CM | POA: Diagnosis not present

## 2016-08-16 DIAGNOSIS — Z9181 History of falling: Secondary | ICD-10-CM | POA: Diagnosis not present

## 2016-08-16 DIAGNOSIS — I10 Essential (primary) hypertension: Secondary | ICD-10-CM | POA: Diagnosis not present

## 2016-08-16 DIAGNOSIS — Z7982 Long term (current) use of aspirin: Secondary | ICD-10-CM | POA: Diagnosis not present

## 2016-08-16 DIAGNOSIS — M19072 Primary osteoarthritis, left ankle and foot: Secondary | ICD-10-CM | POA: Diagnosis not present

## 2016-08-16 DIAGNOSIS — Z853 Personal history of malignant neoplasm of breast: Secondary | ICD-10-CM | POA: Diagnosis not present

## 2016-08-16 DIAGNOSIS — M5117 Intervertebral disc disorders with radiculopathy, lumbosacral region: Secondary | ICD-10-CM | POA: Diagnosis not present

## 2016-08-16 DIAGNOSIS — M1712 Unilateral primary osteoarthritis, left knee: Secondary | ICD-10-CM | POA: Diagnosis not present

## 2016-08-16 DIAGNOSIS — Z96651 Presence of right artificial knee joint: Secondary | ICD-10-CM | POA: Diagnosis not present

## 2016-08-16 DIAGNOSIS — M19071 Primary osteoarthritis, right ankle and foot: Secondary | ICD-10-CM | POA: Diagnosis not present

## 2016-08-19 DIAGNOSIS — Z853 Personal history of malignant neoplasm of breast: Secondary | ICD-10-CM | POA: Diagnosis not present

## 2016-08-19 DIAGNOSIS — Z96651 Presence of right artificial knee joint: Secondary | ICD-10-CM | POA: Diagnosis not present

## 2016-08-19 DIAGNOSIS — Z7982 Long term (current) use of aspirin: Secondary | ICD-10-CM | POA: Diagnosis not present

## 2016-08-19 DIAGNOSIS — M19071 Primary osteoarthritis, right ankle and foot: Secondary | ICD-10-CM | POA: Diagnosis not present

## 2016-08-19 DIAGNOSIS — I10 Essential (primary) hypertension: Secondary | ICD-10-CM | POA: Diagnosis not present

## 2016-08-19 DIAGNOSIS — Z9012 Acquired absence of left breast and nipple: Secondary | ICD-10-CM | POA: Diagnosis not present

## 2016-08-19 DIAGNOSIS — M5117 Intervertebral disc disorders with radiculopathy, lumbosacral region: Secondary | ICD-10-CM | POA: Diagnosis not present

## 2016-08-19 DIAGNOSIS — Z9181 History of falling: Secondary | ICD-10-CM | POA: Diagnosis not present

## 2016-08-19 DIAGNOSIS — M160 Bilateral primary osteoarthritis of hip: Secondary | ICD-10-CM | POA: Diagnosis not present

## 2016-08-19 DIAGNOSIS — M1712 Unilateral primary osteoarthritis, left knee: Secondary | ICD-10-CM | POA: Diagnosis not present

## 2016-08-19 DIAGNOSIS — M19072 Primary osteoarthritis, left ankle and foot: Secondary | ICD-10-CM | POA: Diagnosis not present

## 2016-08-21 DIAGNOSIS — M19071 Primary osteoarthritis, right ankle and foot: Secondary | ICD-10-CM | POA: Diagnosis not present

## 2016-08-21 DIAGNOSIS — Z9012 Acquired absence of left breast and nipple: Secondary | ICD-10-CM | POA: Diagnosis not present

## 2016-08-21 DIAGNOSIS — M5117 Intervertebral disc disorders with radiculopathy, lumbosacral region: Secondary | ICD-10-CM | POA: Diagnosis not present

## 2016-08-21 DIAGNOSIS — Z9181 History of falling: Secondary | ICD-10-CM | POA: Diagnosis not present

## 2016-08-21 DIAGNOSIS — M160 Bilateral primary osteoarthritis of hip: Secondary | ICD-10-CM | POA: Diagnosis not present

## 2016-08-21 DIAGNOSIS — M19072 Primary osteoarthritis, left ankle and foot: Secondary | ICD-10-CM | POA: Diagnosis not present

## 2016-08-21 DIAGNOSIS — Z853 Personal history of malignant neoplasm of breast: Secondary | ICD-10-CM | POA: Diagnosis not present

## 2016-08-21 DIAGNOSIS — Z7982 Long term (current) use of aspirin: Secondary | ICD-10-CM | POA: Diagnosis not present

## 2016-08-21 DIAGNOSIS — Z96651 Presence of right artificial knee joint: Secondary | ICD-10-CM | POA: Diagnosis not present

## 2016-08-21 DIAGNOSIS — I10 Essential (primary) hypertension: Secondary | ICD-10-CM | POA: Diagnosis not present

## 2016-08-21 DIAGNOSIS — M1712 Unilateral primary osteoarthritis, left knee: Secondary | ICD-10-CM | POA: Diagnosis not present

## 2016-08-28 DIAGNOSIS — Z7982 Long term (current) use of aspirin: Secondary | ICD-10-CM | POA: Diagnosis not present

## 2016-08-28 DIAGNOSIS — M19072 Primary osteoarthritis, left ankle and foot: Secondary | ICD-10-CM | POA: Diagnosis not present

## 2016-08-28 DIAGNOSIS — M1712 Unilateral primary osteoarthritis, left knee: Secondary | ICD-10-CM | POA: Diagnosis not present

## 2016-08-28 DIAGNOSIS — M5117 Intervertebral disc disorders with radiculopathy, lumbosacral region: Secondary | ICD-10-CM | POA: Diagnosis not present

## 2016-08-28 DIAGNOSIS — Z9181 History of falling: Secondary | ICD-10-CM | POA: Diagnosis not present

## 2016-08-28 DIAGNOSIS — M160 Bilateral primary osteoarthritis of hip: Secondary | ICD-10-CM | POA: Diagnosis not present

## 2016-08-28 DIAGNOSIS — Z853 Personal history of malignant neoplasm of breast: Secondary | ICD-10-CM | POA: Diagnosis not present

## 2016-08-28 DIAGNOSIS — I10 Essential (primary) hypertension: Secondary | ICD-10-CM | POA: Diagnosis not present

## 2016-08-28 DIAGNOSIS — Z96651 Presence of right artificial knee joint: Secondary | ICD-10-CM | POA: Diagnosis not present

## 2016-08-28 DIAGNOSIS — M19071 Primary osteoarthritis, right ankle and foot: Secondary | ICD-10-CM | POA: Diagnosis not present

## 2016-08-28 DIAGNOSIS — Z9012 Acquired absence of left breast and nipple: Secondary | ICD-10-CM | POA: Diagnosis not present

## 2016-09-04 DIAGNOSIS — L719 Rosacea, unspecified: Secondary | ICD-10-CM | POA: Diagnosis not present

## 2016-09-04 DIAGNOSIS — L570 Actinic keratosis: Secondary | ICD-10-CM | POA: Diagnosis not present

## 2016-09-04 DIAGNOSIS — C44311 Basal cell carcinoma of skin of nose: Secondary | ICD-10-CM | POA: Diagnosis not present

## 2016-09-04 DIAGNOSIS — L578 Other skin changes due to chronic exposure to nonionizing radiation: Secondary | ICD-10-CM | POA: Diagnosis not present

## 2016-09-16 DIAGNOSIS — M19072 Primary osteoarthritis, left ankle and foot: Secondary | ICD-10-CM | POA: Diagnosis not present

## 2016-09-16 DIAGNOSIS — I10 Essential (primary) hypertension: Secondary | ICD-10-CM | POA: Diagnosis not present

## 2016-09-16 DIAGNOSIS — Z7982 Long term (current) use of aspirin: Secondary | ICD-10-CM | POA: Diagnosis not present

## 2016-09-16 DIAGNOSIS — M1712 Unilateral primary osteoarthritis, left knee: Secondary | ICD-10-CM | POA: Diagnosis not present

## 2016-09-16 DIAGNOSIS — Z96651 Presence of right artificial knee joint: Secondary | ICD-10-CM | POA: Diagnosis not present

## 2016-09-16 DIAGNOSIS — M160 Bilateral primary osteoarthritis of hip: Secondary | ICD-10-CM | POA: Diagnosis not present

## 2016-09-16 DIAGNOSIS — Z853 Personal history of malignant neoplasm of breast: Secondary | ICD-10-CM | POA: Diagnosis not present

## 2016-09-16 DIAGNOSIS — Z9012 Acquired absence of left breast and nipple: Secondary | ICD-10-CM | POA: Diagnosis not present

## 2016-09-16 DIAGNOSIS — M19071 Primary osteoarthritis, right ankle and foot: Secondary | ICD-10-CM | POA: Diagnosis not present

## 2016-09-16 DIAGNOSIS — Z9181 History of falling: Secondary | ICD-10-CM | POA: Diagnosis not present

## 2016-09-16 DIAGNOSIS — M5117 Intervertebral disc disorders with radiculopathy, lumbosacral region: Secondary | ICD-10-CM | POA: Diagnosis not present

## 2016-09-18 DIAGNOSIS — M19072 Primary osteoarthritis, left ankle and foot: Secondary | ICD-10-CM | POA: Diagnosis not present

## 2016-09-18 DIAGNOSIS — I10 Essential (primary) hypertension: Secondary | ICD-10-CM | POA: Diagnosis not present

## 2016-09-18 DIAGNOSIS — M1712 Unilateral primary osteoarthritis, left knee: Secondary | ICD-10-CM | POA: Diagnosis not present

## 2016-09-18 DIAGNOSIS — Z7982 Long term (current) use of aspirin: Secondary | ICD-10-CM | POA: Diagnosis not present

## 2016-09-18 DIAGNOSIS — M160 Bilateral primary osteoarthritis of hip: Secondary | ICD-10-CM | POA: Diagnosis not present

## 2016-09-18 DIAGNOSIS — Z96651 Presence of right artificial knee joint: Secondary | ICD-10-CM | POA: Diagnosis not present

## 2016-09-18 DIAGNOSIS — Z9012 Acquired absence of left breast and nipple: Secondary | ICD-10-CM | POA: Diagnosis not present

## 2016-09-18 DIAGNOSIS — Z9181 History of falling: Secondary | ICD-10-CM | POA: Diagnosis not present

## 2016-09-18 DIAGNOSIS — M5117 Intervertebral disc disorders with radiculopathy, lumbosacral region: Secondary | ICD-10-CM | POA: Diagnosis not present

## 2016-09-18 DIAGNOSIS — Z853 Personal history of malignant neoplasm of breast: Secondary | ICD-10-CM | POA: Diagnosis not present

## 2016-09-18 DIAGNOSIS — M19071 Primary osteoarthritis, right ankle and foot: Secondary | ICD-10-CM | POA: Diagnosis not present

## 2016-09-23 DIAGNOSIS — Z9012 Acquired absence of left breast and nipple: Secondary | ICD-10-CM | POA: Diagnosis not present

## 2016-09-23 DIAGNOSIS — M160 Bilateral primary osteoarthritis of hip: Secondary | ICD-10-CM | POA: Diagnosis not present

## 2016-09-23 DIAGNOSIS — M19071 Primary osteoarthritis, right ankle and foot: Secondary | ICD-10-CM | POA: Diagnosis not present

## 2016-09-23 DIAGNOSIS — I10 Essential (primary) hypertension: Secondary | ICD-10-CM | POA: Diagnosis not present

## 2016-09-23 DIAGNOSIS — M19072 Primary osteoarthritis, left ankle and foot: Secondary | ICD-10-CM | POA: Diagnosis not present

## 2016-09-23 DIAGNOSIS — Z853 Personal history of malignant neoplasm of breast: Secondary | ICD-10-CM | POA: Diagnosis not present

## 2016-09-23 DIAGNOSIS — Z9181 History of falling: Secondary | ICD-10-CM | POA: Diagnosis not present

## 2016-09-23 DIAGNOSIS — M5117 Intervertebral disc disorders with radiculopathy, lumbosacral region: Secondary | ICD-10-CM | POA: Diagnosis not present

## 2016-09-23 DIAGNOSIS — M1712 Unilateral primary osteoarthritis, left knee: Secondary | ICD-10-CM | POA: Diagnosis not present

## 2016-09-23 DIAGNOSIS — Z7982 Long term (current) use of aspirin: Secondary | ICD-10-CM | POA: Diagnosis not present

## 2016-09-23 DIAGNOSIS — Z96651 Presence of right artificial knee joint: Secondary | ICD-10-CM | POA: Diagnosis not present

## 2016-09-25 DIAGNOSIS — M5117 Intervertebral disc disorders with radiculopathy, lumbosacral region: Secondary | ICD-10-CM | POA: Diagnosis not present

## 2016-09-25 DIAGNOSIS — M19071 Primary osteoarthritis, right ankle and foot: Secondary | ICD-10-CM | POA: Diagnosis not present

## 2016-09-25 DIAGNOSIS — I10 Essential (primary) hypertension: Secondary | ICD-10-CM | POA: Diagnosis not present

## 2016-09-25 DIAGNOSIS — M1712 Unilateral primary osteoarthritis, left knee: Secondary | ICD-10-CM | POA: Diagnosis not present

## 2016-09-25 DIAGNOSIS — Z853 Personal history of malignant neoplasm of breast: Secondary | ICD-10-CM | POA: Diagnosis not present

## 2016-09-25 DIAGNOSIS — Z9181 History of falling: Secondary | ICD-10-CM | POA: Diagnosis not present

## 2016-09-25 DIAGNOSIS — M160 Bilateral primary osteoarthritis of hip: Secondary | ICD-10-CM | POA: Diagnosis not present

## 2016-09-25 DIAGNOSIS — Z9012 Acquired absence of left breast and nipple: Secondary | ICD-10-CM | POA: Diagnosis not present

## 2016-09-25 DIAGNOSIS — Z96651 Presence of right artificial knee joint: Secondary | ICD-10-CM | POA: Diagnosis not present

## 2016-09-25 DIAGNOSIS — Z7982 Long term (current) use of aspirin: Secondary | ICD-10-CM | POA: Diagnosis not present

## 2016-09-25 DIAGNOSIS — M19072 Primary osteoarthritis, left ankle and foot: Secondary | ICD-10-CM | POA: Diagnosis not present

## 2016-10-02 DIAGNOSIS — M160 Bilateral primary osteoarthritis of hip: Secondary | ICD-10-CM | POA: Diagnosis not present

## 2016-10-02 DIAGNOSIS — M1712 Unilateral primary osteoarthritis, left knee: Secondary | ICD-10-CM | POA: Diagnosis not present

## 2016-10-02 DIAGNOSIS — Z96651 Presence of right artificial knee joint: Secondary | ICD-10-CM | POA: Diagnosis not present

## 2016-10-02 DIAGNOSIS — Z9181 History of falling: Secondary | ICD-10-CM | POA: Diagnosis not present

## 2016-10-02 DIAGNOSIS — M19072 Primary osteoarthritis, left ankle and foot: Secondary | ICD-10-CM | POA: Diagnosis not present

## 2016-10-02 DIAGNOSIS — M5117 Intervertebral disc disorders with radiculopathy, lumbosacral region: Secondary | ICD-10-CM | POA: Diagnosis not present

## 2016-10-02 DIAGNOSIS — I10 Essential (primary) hypertension: Secondary | ICD-10-CM | POA: Diagnosis not present

## 2016-10-02 DIAGNOSIS — Z7982 Long term (current) use of aspirin: Secondary | ICD-10-CM | POA: Diagnosis not present

## 2016-10-02 DIAGNOSIS — M19071 Primary osteoarthritis, right ankle and foot: Secondary | ICD-10-CM | POA: Diagnosis not present

## 2016-10-02 DIAGNOSIS — Z853 Personal history of malignant neoplasm of breast: Secondary | ICD-10-CM | POA: Diagnosis not present

## 2016-10-02 DIAGNOSIS — Z9012 Acquired absence of left breast and nipple: Secondary | ICD-10-CM | POA: Diagnosis not present

## 2016-10-07 DIAGNOSIS — M160 Bilateral primary osteoarthritis of hip: Secondary | ICD-10-CM | POA: Diagnosis not present

## 2016-10-07 DIAGNOSIS — M19071 Primary osteoarthritis, right ankle and foot: Secondary | ICD-10-CM | POA: Diagnosis not present

## 2016-10-07 DIAGNOSIS — M5117 Intervertebral disc disorders with radiculopathy, lumbosacral region: Secondary | ICD-10-CM | POA: Diagnosis not present

## 2016-10-07 DIAGNOSIS — Z9012 Acquired absence of left breast and nipple: Secondary | ICD-10-CM | POA: Diagnosis not present

## 2016-10-07 DIAGNOSIS — I10 Essential (primary) hypertension: Secondary | ICD-10-CM | POA: Diagnosis not present

## 2016-10-07 DIAGNOSIS — Z9181 History of falling: Secondary | ICD-10-CM | POA: Diagnosis not present

## 2016-10-07 DIAGNOSIS — Z96651 Presence of right artificial knee joint: Secondary | ICD-10-CM | POA: Diagnosis not present

## 2016-10-07 DIAGNOSIS — M1712 Unilateral primary osteoarthritis, left knee: Secondary | ICD-10-CM | POA: Diagnosis not present

## 2016-10-07 DIAGNOSIS — M19072 Primary osteoarthritis, left ankle and foot: Secondary | ICD-10-CM | POA: Diagnosis not present

## 2016-10-07 DIAGNOSIS — Z853 Personal history of malignant neoplasm of breast: Secondary | ICD-10-CM | POA: Diagnosis not present

## 2016-10-07 DIAGNOSIS — Z7982 Long term (current) use of aspirin: Secondary | ICD-10-CM | POA: Diagnosis not present

## 2016-10-09 DIAGNOSIS — M19071 Primary osteoarthritis, right ankle and foot: Secondary | ICD-10-CM | POA: Diagnosis not present

## 2016-10-09 DIAGNOSIS — Z853 Personal history of malignant neoplasm of breast: Secondary | ICD-10-CM | POA: Diagnosis not present

## 2016-10-09 DIAGNOSIS — I10 Essential (primary) hypertension: Secondary | ICD-10-CM | POA: Diagnosis not present

## 2016-10-09 DIAGNOSIS — M5117 Intervertebral disc disorders with radiculopathy, lumbosacral region: Secondary | ICD-10-CM | POA: Diagnosis not present

## 2016-10-09 DIAGNOSIS — Z9012 Acquired absence of left breast and nipple: Secondary | ICD-10-CM | POA: Diagnosis not present

## 2016-10-09 DIAGNOSIS — M1712 Unilateral primary osteoarthritis, left knee: Secondary | ICD-10-CM | POA: Diagnosis not present

## 2016-10-09 DIAGNOSIS — M19072 Primary osteoarthritis, left ankle and foot: Secondary | ICD-10-CM | POA: Diagnosis not present

## 2016-10-09 DIAGNOSIS — Z9181 History of falling: Secondary | ICD-10-CM | POA: Diagnosis not present

## 2016-10-09 DIAGNOSIS — Z7982 Long term (current) use of aspirin: Secondary | ICD-10-CM | POA: Diagnosis not present

## 2016-10-09 DIAGNOSIS — Z96651 Presence of right artificial knee joint: Secondary | ICD-10-CM | POA: Diagnosis not present

## 2016-10-09 DIAGNOSIS — M160 Bilateral primary osteoarthritis of hip: Secondary | ICD-10-CM | POA: Diagnosis not present

## 2016-10-14 DIAGNOSIS — Z9181 History of falling: Secondary | ICD-10-CM | POA: Diagnosis not present

## 2016-10-14 DIAGNOSIS — M5117 Intervertebral disc disorders with radiculopathy, lumbosacral region: Secondary | ICD-10-CM | POA: Diagnosis not present

## 2016-10-14 DIAGNOSIS — M19072 Primary osteoarthritis, left ankle and foot: Secondary | ICD-10-CM | POA: Diagnosis not present

## 2016-10-14 DIAGNOSIS — Z7982 Long term (current) use of aspirin: Secondary | ICD-10-CM | POA: Diagnosis not present

## 2016-10-14 DIAGNOSIS — M19071 Primary osteoarthritis, right ankle and foot: Secondary | ICD-10-CM | POA: Diagnosis not present

## 2016-10-14 DIAGNOSIS — Z96651 Presence of right artificial knee joint: Secondary | ICD-10-CM | POA: Diagnosis not present

## 2016-10-14 DIAGNOSIS — M1712 Unilateral primary osteoarthritis, left knee: Secondary | ICD-10-CM | POA: Diagnosis not present

## 2016-10-14 DIAGNOSIS — M160 Bilateral primary osteoarthritis of hip: Secondary | ICD-10-CM | POA: Diagnosis not present

## 2016-10-14 DIAGNOSIS — Z9012 Acquired absence of left breast and nipple: Secondary | ICD-10-CM | POA: Diagnosis not present

## 2016-10-14 DIAGNOSIS — I10 Essential (primary) hypertension: Secondary | ICD-10-CM | POA: Diagnosis not present

## 2016-10-14 DIAGNOSIS — Z853 Personal history of malignant neoplasm of breast: Secondary | ICD-10-CM | POA: Diagnosis not present

## 2016-10-16 ENCOUNTER — Ambulatory Visit (INDEPENDENT_AMBULATORY_CARE_PROVIDER_SITE_OTHER): Payer: Medicare Other | Admitting: Internal Medicine

## 2016-10-16 ENCOUNTER — Encounter: Payer: Self-pay | Admitting: Internal Medicine

## 2016-10-16 VITALS — BP 118/78 | HR 67 | Resp 16 | Ht 65.0 in | Wt 196.0 lb

## 2016-10-16 DIAGNOSIS — K30 Functional dyspepsia: Secondary | ICD-10-CM

## 2016-10-16 DIAGNOSIS — Z23 Encounter for immunization: Secondary | ICD-10-CM

## 2016-10-16 DIAGNOSIS — W19XXXA Unspecified fall, initial encounter: Secondary | ICD-10-CM | POA: Diagnosis not present

## 2016-10-16 DIAGNOSIS — M1712 Unilateral primary osteoarthritis, left knee: Secondary | ICD-10-CM | POA: Diagnosis not present

## 2016-10-16 DIAGNOSIS — M179 Osteoarthritis of knee, unspecified: Secondary | ICD-10-CM | POA: Insufficient documentation

## 2016-10-16 DIAGNOSIS — M1710 Unilateral primary osteoarthritis, unspecified knee: Secondary | ICD-10-CM | POA: Insufficient documentation

## 2016-10-16 NOTE — Patient Instructions (Signed)
Begin a Probiotic capsule twice a day.

## 2016-10-16 NOTE — Progress Notes (Signed)
Date:  10/16/2016   Name:  Kelli Williams   DOB:  Jul 05, 1926   MRN:  [id number]   Chief Complaint: Fall (Hurt Lumbar and Thorasic area of back on Sunday and has been stiff and sore. Her knees and her left arm has small abrasion. Her Left thigh very sore from getting up and she also is in PT with Home Health but does not think the pain is from that.  )  Fall - several days ago at home. She simply lost her balance as she went to turn off the TV.  Fell backwards onto carpet. Was able to get up with help but is sore all over.  She did not want to go to the hospital. She is still doing home health PTx. Able to walk with walker now. Indigestion - pt has intermittent lower abd discomfort with bloating and gas.  No frank diarrhea but sometimes loose.  No blood seen.  No nausea or vomiting.  Eating Activia daily.   Knee pain - OA of the left knee.  May have strained it in the fall.  It is not more swollen and there is no deformity or redness.  She did not fall onto her knees but did try to get on her knees to get up.  Review of Systems  Constitutional: Negative for diaphoresis, fatigue and fever.  Respiratory: Negative for chest tightness, shortness of breath and wheezing.   Cardiovascular: Positive for leg swelling. Negative for chest pain and palpitations.  Gastrointestinal: Positive for abdominal distention. Negative for abdominal pain, blood in stool, constipation and rectal pain.  Musculoskeletal: Positive for arthralgias, gait problem, joint swelling, myalgias and neck pain. Negative for back pain and neck stiffness.  Skin: Positive for wound (abrasion left forearm). Negative for color change.  Hematological: Negative for adenopathy. Does not bruise/bleed easily.  Psychiatric/Behavioral: Negative for dysphoric mood.    Patient Active Problem List   Diagnosis Date Noted  . OA (osteoarthritis) of knee 10/16/2016  . Status post mastectomy 01/15/2016  . Leg swelling 10/11/2015  . Neuropathy  involving both lower extremities 06/19/2015  . Secondary localized osteoarthrosis, ankle and foot 06/01/2015  . Acne erythematosa 06/01/2015  . Avitaminosis D 06/01/2015  . Atrial tachycardia ([place]) 08/24/2014  . Essential hypertension 08/17/2012  . Adjustment disorder 08/17/2012  . History of breast cancer 08/17/2012    Prior to Admission medications   Medication Sig Start Date End Date Taking? Authorizing Provider  aspirin 81 MG tablet Take 1 tablet by mouth daily.   Yes Historical Provider, MD  B Complex-Folic Acid (BENFOTIAMINE MULTI-B) CAPS Take 1 tablet by mouth daily.   Yes Historical Provider, MD  Calcium-Magnesium 500-250 MG TABS Take by mouth daily.   Yes Historical Provider, MD  Cholecalciferol 1000 UNITS capsule Take 1 capsule by mouth daily.   Yes Historical Provider, MD  Coenzyme Q10 (CO Q-10) 100 MG CAPS Take by mouth daily.   Yes Historical Provider, MD  Docosahexaenoic Acid (DHA OMEGA 3 PO) Take 180 mg by mouth daily.   Yes Historical Provider, MD  gabapentin (NEURONTIN) 300 MG capsule Take 3 capsules (900 mg total) by mouth 2 (two) times daily. 04/04/16  Yes Glean Hess, MD  ibuprofen (ADVIL,MOTRIN) 600 MG tablet Take 600 mg by mouth every 6 (six) hours as needed.   Yes Historical Provider, MD  lisinopril-hydrochlorothiazide (PRINZIDE,ZESTORETIC) 20-25 MG tablet Take 1.5 tablets by mouth daily. 04/04/16  Yes Glean Hess, MD  montelukast (SINGULAIR) 10 MG tablet Take  1 tablet by mouth at  bedtime 08/07/16  Yes Glean Hess, MD  Multiple Vitamins-Minerals (MULTIVITAMIN WITH MINERALS) tablet Take 1 tablet by mouth daily.   Yes Historical Provider, MD  Omega-3 Fatty Acids (FISH OIL CONCENTRATE) 300 MG CAPS Take by mouth. 08/08/10  Yes Historical Provider, MD  propranolol (INDERAL) 10 MG tablet Take 1 tablet by mouth up  to 3 times daily as needed 02/03/16  Yes Glean Hess, MD    Allergies  Allergen Reactions  . Amitriptyline Hives  . Sulfa Antibiotics Rash  .  Sulfa Antibiotics Rash    Past Surgical History:  Procedure Laterality Date  . ABDOMINAL HYSTERECTOMY    . BREAST LUMPECTOMY     left  . BREAST LUMPECTOMY    . CATARACT EXTRACTION     left eye  . GALLBLADDER SURGERY    . PARTIAL KNEE ARTHROPLASTY     right  . right knee replacement    . TOTAL VAGINAL HYSTERECTOMY      Social History  Substance Use Topics  . Smoking status: Never Smoker  . Smokeless tobacco: Never Used  . Alcohol use No     Medication list has been reviewed and updated.   Physical Exam  Constitutional: She is oriented to person, place, and time. She appears well-developed. No distress.  HENT:  Head: Normocephalic and atraumatic.  Neck: Normal range of motion. Neck supple. No thyromegaly present.  Cardiovascular: Normal rate, regular rhythm and normal heart sounds.   Pulmonary/Chest: Effort normal and breath sounds normal. No respiratory distress.  Abdominal: Soft. She exhibits no distension. There is no tenderness.  Musculoskeletal: Normal range of motion.       Right hip: She exhibits normal range of motion and normal strength.       Left hip: She exhibits normal range of motion.       Right knee: She exhibits swelling. She exhibits no effusion. No tenderness found.       Left knee: She exhibits swelling. Tenderness found.       Cervical back: She exhibits no tenderness and no bony tenderness.       Thoracic back: She exhibits no tenderness and no bony tenderness.       Lumbar back: She exhibits no tenderness and no bony tenderness.  Neurological: She is alert and oriented to person, place, and time.  Skin: Skin is warm, dry and intact. No rash noted.  Small abrasion on left forearm  Psychiatric: She has a normal mood and affect. Her speech is normal and behavior is normal. Thought content normal.  Nursing note and vitals reviewed.   BP 118/78   Pulse 67   Resp 16   Ht 5\' 5"  (1.651 m)   Wt 196 lb (88.9 kg)   SpO2 97%   BMI 32.62 kg/m    Assessment and Plan: 1. Fall, initial encounter Continue to work with PT on balance No evidence of fracture - may take Advil as needed Use walker more regularly   2. Primary osteoarthritis of left knee Followed by orthopedics  3. Need for influenza vaccination - Flu Vaccine QUAD 36+ mos IM  4. Indigestion Recommend probiotics bid   Halina Maidens, MD [facility]  10/16/2016

## 2016-11-13 DIAGNOSIS — H168 Other keratitis: Secondary | ICD-10-CM | POA: Diagnosis not present

## 2016-12-16 DIAGNOSIS — M1712 Unilateral primary osteoarthritis, left knee: Secondary | ICD-10-CM | POA: Diagnosis not present

## 2016-12-16 DIAGNOSIS — M25552 Pain in left hip: Secondary | ICD-10-CM | POA: Diagnosis not present

## 2016-12-16 DIAGNOSIS — M25571 Pain in right ankle and joints of right foot: Secondary | ICD-10-CM | POA: Diagnosis not present

## 2016-12-30 ENCOUNTER — Encounter: Payer: Self-pay | Admitting: Internal Medicine

## 2016-12-30 ENCOUNTER — Ambulatory Visit (INDEPENDENT_AMBULATORY_CARE_PROVIDER_SITE_OTHER): Payer: Medicare Other | Admitting: Internal Medicine

## 2016-12-30 VITALS — BP 118/64 | HR 64 | Temp 98.1°F | Ht 64.0 in | Wt 201.0 lb

## 2016-12-30 DIAGNOSIS — M1712 Unilateral primary osteoarthritis, left knee: Secondary | ICD-10-CM | POA: Diagnosis not present

## 2016-12-30 DIAGNOSIS — R0982 Postnasal drip: Secondary | ICD-10-CM

## 2016-12-30 DIAGNOSIS — J0140 Acute pansinusitis, unspecified: Secondary | ICD-10-CM

## 2016-12-30 DIAGNOSIS — I10 Essential (primary) hypertension: Secondary | ICD-10-CM

## 2016-12-30 MED ORDER — MONTELUKAST SODIUM 10 MG PO TABS: 10.0000 mg | ORAL_TABLET | Freq: Every day | ORAL | 3 refills | Status: DC

## 2016-12-30 MED ORDER — AMOXICILLIN 875 MG PO TABS: 875.0000 mg | ORAL_TABLET | Freq: Two times a day (BID) | ORAL | 0 refills | Status: DC

## 2016-12-30 NOTE — Progress Notes (Signed)
Date:  12/30/2016   Name:  Kelli Williams   DOB:  June 20, 1926   MRN:  [id number]   Chief Complaint: Sinus Problem Sinus Problem  This is a new problem. The current episode started in the past 7 days. The problem has been gradually worsening since onset. There has been no fever. The pain is mild. Associated symptoms include coughing, ear pain, a hoarse voice, sinus pressure and a sore throat. Pertinent negatives include no chills, shortness of breath or swollen glands. Past treatments include acetaminophen. The treatment provided mild relief.  Hypertension  This is a chronic problem. The problem is controlled. Pertinent negatives include no chest pain, palpitations or shortness of breath.   She is having more trouble with falls and knee instability.  She has seen Ortho - needs surgery but too old at this point.  She tried injection with steroids but no benefit.  Ortho did not think she would respond to synvisc.  He is getting her a custom brace for her left knee to improve stability.   Review of Systems  Constitutional: Negative for chills, fatigue and fever.  HENT: Positive for ear pain, hoarse voice, sinus pressure and sore throat.   Respiratory: Positive for cough. Negative for shortness of breath.   Cardiovascular: Negative for chest pain, palpitations and leg swelling.  Musculoskeletal: Positive for arthralgias, back pain, gait problem and joint swelling.    Patient Active Problem List   Diagnosis Date Noted  . OA (osteoarthritis) of knee 10/16/2016  . Status post mastectomy 01/15/2016  . Leg swelling 10/11/2015  . Neuropathy involving both lower extremities 06/19/2015  . Secondary localized osteoarthrosis, ankle and foot 06/01/2015  . Acne erythematosa 06/01/2015  . Avitaminosis D 06/01/2015  . Atrial tachycardia ([place]) 08/24/2014  . Essential hypertension 08/17/2012  . Adjustment disorder 08/17/2012  . History of breast cancer 08/17/2012    Prior to Admission medications    Medication Sig Start Date End Date Taking? Authorizing Provider  aspirin 81 MG tablet Take 1 tablet by mouth daily.   Yes Historical Provider, MD  B Complex-Folic Acid (BENFOTIAMINE MULTI-B) CAPS Take 1 tablet by mouth daily.   Yes Historical Provider, MD  Calcium-Magnesium 500-250 MG TABS Take by mouth daily.   Yes Historical Provider, MD  Cholecalciferol 1000 UNITS capsule Take 1 capsule by mouth daily.   Yes Historical Provider, MD  Coenzyme Q10 (CO Q-10) 100 MG CAPS Take by mouth daily.   Yes Historical Provider, MD  Docosahexaenoic Acid (DHA OMEGA 3 PO) Take 180 mg by mouth daily.   Yes Historical Provider, MD  gabapentin (NEURONTIN) 300 MG capsule Take 3 capsules (900 mg total) by mouth 2 (two) times daily. 04/04/16  Yes Glean Hess, MD  ibuprofen (ADVIL,MOTRIN) 600 MG tablet Take 600 mg by mouth every 6 (six) hours as needed.   Yes Historical Provider, MD  lisinopril-hydrochlorothiazide (PRINZIDE,ZESTORETIC) 20-25 MG tablet Take 1.5 tablets by mouth daily. 04/04/16  Yes Glean Hess, MD  montelukast (SINGULAIR) 10 MG tablet Take 1 tablet by mouth at  bedtime 08/07/16  Yes Glean Hess, MD  Multiple Vitamins-Minerals (MULTIVITAMIN WITH MINERALS) tablet Take 1 tablet by mouth daily.   Yes Historical Provider, MD  Omega-3 Fatty Acids (FISH OIL CONCENTRATE) 300 MG CAPS Take by mouth. 08/08/10  Yes Historical Provider, MD  propranolol (INDERAL) 10 MG tablet Take 1 tablet by mouth up  to 3 times daily as needed 02/03/16  Yes Glean Hess, MD    Allergies  Allergen Reactions  . Amitriptyline Hives  . Sulfa Antibiotics Rash  . Sulfa Antibiotics Rash    Past Surgical History:  Procedure Laterality Date  . ABDOMINAL HYSTERECTOMY    . BREAST LUMPECTOMY     left  . BREAST LUMPECTOMY    . CATARACT EXTRACTION     left eye  . GALLBLADDER SURGERY    . PARTIAL KNEE ARTHROPLASTY     right  . right knee replacement    . TOTAL VAGINAL HYSTERECTOMY      Social History    Substance Use Topics  . Smoking status: Never Smoker  . Smokeless tobacco: Never Used  . Alcohol use No     Medication list has been reviewed and updated.   Physical Exam  Constitutional: She is oriented to person, place, and time. She appears well-developed and well-nourished.  HENT:  Right Ear: External ear and ear canal normal. Tympanic membrane is not erythematous and not retracted.  Left Ear: External ear and ear canal normal. Tympanic membrane is perforated. Tympanic membrane is not erythematous and not retracted.  Nose: Right sinus exhibits maxillary sinus tenderness and frontal sinus tenderness. Left sinus exhibits maxillary sinus tenderness and frontal sinus tenderness.  Mouth/Throat: Uvula is midline and mucous membranes are normal. No oral lesions. Posterior oropharyngeal erythema present. No oropharyngeal exudate.  Neck: Neck supple.  Cardiovascular: Normal rate, regular rhythm and normal heart sounds.   Pulmonary/Chest: Effort normal and breath sounds normal. She has no wheezes. She has no rales.  Lymphadenopathy:    She has no cervical adenopathy.  Neurological: She is alert and oriented to person, place, and time.    BP 118/64   Pulse 64   Temp 98.1 F (36.7 C)   Ht 5\' 4"  (1.626 m)   Wt 201 lb (91.2 kg)   SpO2 97%   BMI 34.50 kg/m   Assessment and Plan: 1. Acute non-recurrent pansinusitis Continue singulair - amoxicillin (AMOXIL) 875 MG tablet; Take 1 tablet (875 mg total) by mouth 2 (two) times daily.  Dispense: 20 tablet; Refill: 0  2. Primary osteoarthritis of left knee Follow up with Ortho as needed Consider custom brace as ordered  3. Essential hypertension controlled  4. Post-nasal drip - montelukast (SINGULAIR) 10 MG tablet; Take 1 tablet (10 mg total) by mouth at bedtime.  Dispense: 90 tablet; Refill: [place], MD [facility]  12/30/2016

## 2017-01-09 ENCOUNTER — Encounter: Payer: Self-pay | Admitting: Cardiovascular Disease

## 2017-01-09 ENCOUNTER — Ambulatory Visit (INDEPENDENT_AMBULATORY_CARE_PROVIDER_SITE_OTHER): Payer: Medicare Other | Admitting: Cardiovascular Disease

## 2017-01-09 VITALS — BP 140/84 | HR 61 | Ht 65.0 in | Wt 196.2 lb

## 2017-01-09 DIAGNOSIS — I10 Essential (primary) hypertension: Secondary | ICD-10-CM

## 2017-01-09 DIAGNOSIS — M7989 Other specified soft tissue disorders: Secondary | ICD-10-CM | POA: Diagnosis not present

## 2017-01-09 DIAGNOSIS — F4329 Adjustment disorder with other symptoms: Secondary | ICD-10-CM | POA: Diagnosis not present

## 2017-01-09 DIAGNOSIS — I471 Supraventricular tachycardia: Secondary | ICD-10-CM

## 2017-01-09 NOTE — Progress Notes (Signed)
Cardiology Office Note  Date:  01/09/2017   ID:  Kelli Williams, DOB Apr 20, 1926, MRN [id number]  PCP:  Halina Maidens, MD   Chief Complaint  Patient presents with  . other    12 month follow up. Meds reviewed by the pt. verbally.     HPI:  Kelli Williams is a very pleasant 83 year old woman with no known coronary artery disease or cardiac issues who presents for routine followup of her tachycardia/palpitations Status post radiation treatment for breast cancer in 2000 on the left with 7 weeks of radiation on a daily basis.  She lost her husband in March 2016. Still with significant adjustment disorder/depression last echo 2011: normal EF 60%,  She lives at the [facility].  finished PT, was not happy with their service She has a back brace coming in the mail Having severe pain left knee Was told she was too old for total knee replacement  Otherwise has rare palpitations at nighttime, takes propranolol as needed Scant swelling lower extremities, does not want to wear compression hose Blood pressure well controlled at home No regular exercise, weight stable 196 pounds  Previously reported having Chronic pain in her foot following a  Fracture, walks with a cane  EKG on today's visit shows normal sinus rhythm with rate 59 bpm, no significant ST or T-wave changes  Other past medical history  She did have stress test in 2012 at the recommendation of her cardiologist at [facility] She reports having a previous fracture in her right foot, seen by podiatry and Dr. Marry Guan  Lab work from September 2013 shows total cholesterol 167, LDL 91  PMH:   has a past medical history of Cancer ([place]); Hypertension; Neuropathic pain of both legs; Neuropathy ([place]); Skin cancer of forehead; and Skin cancer of nose.  PSH:    Past Surgical History:  Procedure Laterality Date  . ABDOMINAL HYSTERECTOMY    . BREAST LUMPECTOMY     left  . BREAST LUMPECTOMY    . CATARACT EXTRACTION     left eye  .  GALLBLADDER SURGERY    . PARTIAL KNEE ARTHROPLASTY     right  . right knee replacement    . TOTAL VAGINAL HYSTERECTOMY      Current Outpatient Prescriptions  Medication Sig Dispense Refill  . aspirin 81 MG tablet Take 1 tablet by mouth daily.    . B Complex-Folic Acid (BENFOTIAMINE MULTI-B) CAPS Take 1 tablet by mouth daily.    . Calcium-Magnesium 500-250 MG TABS Take by mouth daily.    . Cholecalciferol 1000 UNITS capsule Take 1 capsule by mouth daily.    . Coenzyme Q10 (CO Q-10) 100 MG CAPS Take by mouth daily.    Marland Kitchen gabapentin (NEURONTIN) 300 MG capsule Take 3 capsules (900 mg total) by mouth 2 (two) times daily. 540 capsule 3  . ibuprofen (ADVIL,MOTRIN) 600 MG tablet Take 600 mg by mouth every 6 (six) hours as needed.    Marland Kitchen lisinopril-hydrochlorothiazide (PRINZIDE,ZESTORETIC) 20-25 MG tablet Take 1.5 tablets by mouth daily. 135 tablet 3  . Multiple Vitamins-Minerals (MULTIVITAMIN WITH MINERALS) tablet Take 1 tablet by mouth daily.    . Omega-3 Fatty Acids (FISH OIL CONCENTRATE) 300 MG CAPS Take by mouth.    . propranolol (INDERAL) 10 MG tablet Take 1 tablet by mouth up  to 3 times daily as needed 270 tablet 3   No current facility-administered medications for this visit.      Allergies:   Amitriptyline; Amoxicillin; Sulfa antibiotics; and  Sulfa antibiotics   Social History:  The patient  reports that she has never smoked. She has never used smokeless tobacco. She reports that she does not drink alcohol or use drugs.   Family History:   family history includes Heart attack in her paternal uncle; Heart disease in her mother.    Review of Systems: Review of Systems  Respiratory: Negative.   Cardiovascular: Positive for leg swelling.  Gastrointestinal: Negative.   Musculoskeletal: Positive for joint pain.       Difficulty walking  Neurological: Positive for weakness.  Psychiatric/Behavioral: Negative.   All other systems reviewed and are negative.    PHYSICAL EXAM: VS:  BP  140/84 (BP Location: Right Arm, Patient Position: Sitting, Cuff Size: Small)   Pulse 61   Ht 5\' 5"  (1.651 m)   Wt 196 lb 4 oz (89 kg)   BMI 32.66 kg/m  , BMI Body mass index is 32.66 kg/m. GEN: Well nourished, well developed, in no acute distress, obese  HEENT: normal  Neck: no JVD, carotid bruits, or masses Cardiac: RRR; no murmurs, rubs, or gallops,no edema  Respiratory:  clear to auscultation bilaterally, normal work of breathing GI: soft, nontender, nondistended, + BS MS: no deformity or atrophy  Skin: warm and dry, no rash Neuro:  Strength and sensation are intact Psych: euthymic mood, full affect    Recent Labs: 06/19/2016: ALT 14; BUN 18; Creatinine, Ser 0.69; Platelets 260; Potassium 4.0; Sodium 143; TSH 2.190    Lipid Panel Lab Results  Component Value Date   CHOL 170 06/19/2016   HDL 67 06/19/2016   LDLCALC 86 06/19/2016   TRIG 85 06/19/2016      Wt Readings from Last 3 Encounters:  01/09/17 196 lb 4 oz (89 kg)  12/30/16 201 lb (91.2 kg)  10/16/16 196 lb (88.9 kg)       ASSESSMENT AND PLAN:  Essential hypertension - Plan: EKG 12-Lead Blood pressure is well controlled on today's visit. No changes made to the medications.  Atrial tachycardia ([place]) - Plan: EKG 12-Lead She'll continue to take propranolol as needed, having rare symptoms of palpitations  Adjustment disorder with other symptom - Plan: EKG 12-Lead Difficulty adjusting after loss of her husband Discussed with her, recommended a regular exercise regimen Engage in social activities  Leg swelling - Plan: EKG 12-Lead Leg swelling likely from venous insufficiency, no evidence of pitting edema concerning for CHF She does not want compression hose at this time  Long discussion about preoperative risk for total knee replacement on the left I feel she would be acceptable candidate for knee replacement surgery No further testing would be needed  Total encounter time more than 25 minutes  Greater  than 50% was spent in counseling and coordination of care with the patient   Disposition:   F/U  12 months   Orders Placed This Encounter  Procedures  . EKG 12-Lead     Signed, Esmond Plants, M.D., Ph.D. 01/09/2017  [place], [place]

## 2017-01-09 NOTE — Patient Instructions (Signed)

## 2017-01-14 DIAGNOSIS — L578 Other skin changes due to chronic exposure to nonionizing radiation: Secondary | ICD-10-CM | POA: Diagnosis not present

## 2017-01-14 DIAGNOSIS — L719 Rosacea, unspecified: Secondary | ICD-10-CM | POA: Diagnosis not present

## 2017-01-14 DIAGNOSIS — Z85828 Personal history of other malignant neoplasm of skin: Secondary | ICD-10-CM | POA: Diagnosis not present

## 2017-01-14 DIAGNOSIS — L821 Other seborrheic keratosis: Secondary | ICD-10-CM | POA: Diagnosis not present

## 2017-01-14 DIAGNOSIS — L905 Scar conditions and fibrosis of skin: Secondary | ICD-10-CM | POA: Diagnosis not present

## 2017-01-16 ENCOUNTER — Other Ambulatory Visit: Payer: Self-pay | Admitting: Internal Medicine

## 2017-02-14 DIAGNOSIS — M21061 Valgus deformity, not elsewhere classified, right knee: Secondary | ICD-10-CM | POA: Diagnosis not present

## 2017-02-27 DIAGNOSIS — M1712 Unilateral primary osteoarthritis, left knee: Secondary | ICD-10-CM | POA: Diagnosis not present

## 2017-03-06 ENCOUNTER — Other Ambulatory Visit: Payer: Self-pay

## 2017-03-06 MED ORDER — GABAPENTIN 300 MG PO CAPS: 900.0000 mg | ORAL_CAPSULE | Freq: Two times a day (BID) | ORAL | 3 refills | Status: DC

## 2017-04-07 ENCOUNTER — Ambulatory Visit: Payer: Medicare Other | Attending: Orthopedic Surgery | Admitting: Physical Therapy

## 2017-04-07 ENCOUNTER — Encounter: Payer: Self-pay | Admitting: Physical Therapy

## 2017-04-07 DIAGNOSIS — R262 Difficulty in walking, not elsewhere classified: Secondary | ICD-10-CM | POA: Diagnosis not present

## 2017-04-07 DIAGNOSIS — M6281 Muscle weakness (generalized): Secondary | ICD-10-CM | POA: Diagnosis not present

## 2017-04-07 NOTE — Therapy (Signed)
[place] MAIN [facility] SERVICES [address] [place], [place], [address] Phone: [phone]   Fax:  [phone]  Physical Therapy Evaluation  Patient Details  Name: Kelli Williams MRN: [id number] Date of Birth: 11/20/1926 Referring Provider: Hessie Knows  Encounter Date: 04/07/2017      PT End of Session - 04/07/17 1159    Visit Number 1   Number of Visits 17   Date for PT Re-Evaluation 06/02/17   Authorization Type 1/10   PT Start Time 1130   PT Stop Time 1230   PT Time Calculation (min) 60 min   Equipment Utilized During Treatment Gait belt   Activity Tolerance Patient tolerated treatment well;Patient limited by fatigue;Patient limited by pain   Behavior During Therapy WFL for tasks assessed/performed      Past Medical History:  Diagnosis Date  . Cancer ([place])   . Hypertension   . Neuropathic pain of both legs   . Neuropathy   . Skin cancer of forehead   . Skin cancer of nose     Past Surgical History:  Procedure Laterality Date  . ABDOMINAL HYSTERECTOMY    . BREAST LUMPECTOMY     left  . BREAST LUMPECTOMY    . CATARACT EXTRACTION     left eye  . GALLBLADDER SURGERY    . PARTIAL KNEE ARTHROPLASTY     right  . right knee replacement    . TOTAL VAGINAL HYSTERECTOMY      There were no vitals filed for this visit.       Subjective Assessment - 04/07/17 1140    Subjective Patient is having left knee buckling and her standing balance is getting worse. She walks with the cane indoors and uses the rollator outdoors   Patient is accompained by: Family member   Pertinent History Patient had R TKR 7 years ago, she fractured her right foot 3 years ago and wore a boot for 4 months. She strainned her left  knee while her right foot was healing. She went to the MD and they dont want to do any surgery on the left knee. She has had 2 falls in the last 6 months , She lives at the [place] and her husbance died.    Limitations Standing;Walking   How long can you sit comfortably? unlimited   How long can you stand comfortably? 30 mins   How long can you walk comfortably? 15 mins   Diagnostic tests x- ray BLE hips and knees   Patient Stated Goals Patient wants to be able to not fall.    Currently in Pain? Yes   Pain Score 2    Pain Location Knee   Pain Orientation Left   Pain Type Chronic pain   Pain Onset More than a month ago   Pain Frequency Intermittent   Aggravating Factors  sit to stand   Pain Relieving Factors medicine   Effect of Pain on Daily Activities no able to get up and down   Multiple Pain Sites --  BLE feet 4/10            [facility] PT Assessment - 04/07/17 1152      Assessment   Medical Diagnosis decreased balance, weakness   Referring Provider [facility], MICHAEL   Onset Date/Surgical Date 03/05/17   Hand Dominance Right   Next MD Visit no appointment   Prior Therapy none for this episode     Precautions   Precautions Fall  Required Braces or Orthoses --  left knee brace     Restrictions   Weight Bearing Restrictions No     Balance Screen   Has the patient fallen in the past 6 months Yes   How many times? 2   Has the patient had a decrease in activity level because of a fear of falling?  Yes   Is the patient reluctant to leave their home because of a fear of falling?  No     Home Environment   Living Environment --  retirement home     Prior Function   Level of Independence Independent with household mobility with device;Requires assistive device for independence   Vocation Retired   Leisure read, eating with friends and visit        POSTURE: WNL  PROM/AROM: WFL  STRENGTH:  Graded on a 0-5 scale Muscle Group Left Right                          Hip Flex -3/5 -3/5  Hip Abd -3/5 -3/5  Hip Add 2/5 2/5  Hip Ext 2/5 2/5  Hip IR/ER 4/5 4/5  Knee Flex 4/5 4/5  Knee Ext 4/5 4/5  Ankle DF 4/5 4/5  Ankle PF 4/5 4/5   SENSATION: WNL   FUNCTIONAL  MOBILITY: independent but slow and guarded due to weakness Transfers are limited by BLE knee pain   BALANCE:poor dynamic standing balance   fair static standing balance Unable to tandem stand and unable to single leg stand   GAIT: Patient ambulates with rollator when outside and a spc when indoors . She has decreased step height and decreased step length. She has limited distance due to increased left knee pain and BLE feet pain.   OUTCOME MEASURES: TEST Outcome Interpretation  5 times sit<>stand 25.74sec >81 yo, >15 sec indicates increased risk for falls  10 meter walk test    .56             m/s <1.0 m/s indicates increased risk for falls; limited community ambulator  Timed up and Go   22.20              sec <14 sec indicates increased risk for falls  6 minute walk test       425         Feet 1000 feet is community ambulator    <                           PT Education - 04/07/17 1158    Education provided Yes   Education Details plan of care   Person(s) Educated Patient   Methods Explanation   Comprehension Verbalized understanding             PT Long Term Goals - 04/07/17 1557      PT LONG TERM GOAL #1   Title Patient will be independent in home exercise program to improve strength/mobility for better functional independence with ADLs.   Baseline no HEP   Time 8   Period Weeks   Status New     PT LONG TERM GOAL #2   Title Patient (> 81 years old) will complete five times sit to stand test in < 15 seconds indicating an increased LE strength and improved balance.   Baseline 25.74 sec   Time 8   Period Weeks   Status New  PT LONG TERM GOAL #3   Title Patient will increase six minute walk test distance to >1000 for progression to community ambulator and improve gait ability   Baseline 425 feet   Time 8   Period Weeks   Status New     PT LONG TERM GOAL #4   Title Patient will increase 10 meter walk test to >1.85m/s as to improve gait speed  for better community ambulation and to reduce fall risk.   Baseline .56 m/sec   Period Weeks   Status New     PT LONG TERM GOAL #5   Title Patient will increase BLE gross strength to 4+/5 as to improve functional strength for independent gait, increased standing tolerance and increased ADL ability.   Baseline -3/5 strength B hips flex and aabd   Period Weeks   Status New               Plan - 04-16-2017 1541    Clinical Impression Statement  Patient presents with hx of falls and Dx of loss of balance and weakness. Patient  has deficits in strength of -3/5 BLE hip flexors, -3/5 BLE hip abd, 3/5 strength BLE knees,4/5 BLE ankles, gait with rollator and decreased gait speed ,dynamic standing balance deficits of poor and fair static standing balance. Her outcome measures indicate a high falls risk including TUG, 10 MW , 5 x sit to stand and 6 MW test. Patient will benefit from skilled PT to improve dynamic standing balance, strength, gait,, and reach functional goals.      Rehab Potential Good   PT Frequency 2x / week   PT Duration 8 weeks   PT Treatment/Interventions Balance training;Therapeutic exercise;Therapeutic activities;Patient/family education   PT Next Visit Plan strengthening and balance training   PT Home Exercise Plan hip abd standing and sidelying   Consulted and Agree with Plan of Care Patient      Patient will benefit from skilled therapeutic intervention in order to improve the following deficits and impairments:  Abnormal gait, Decreased balance, Decreased endurance, Decreased mobility, Difficulty walking, Decreased activity tolerance, Decreased safety awareness, Decreased strength, Pain  Visit Diagnosis: Muscle weakness (generalized)  Difficulty in walking, not elsewhere classified      G-Codes - 2017/04/16 1540    Functional Assessment Tool Used (Outpatient Only) 5 x sit to stand, TUG, 10 MW, 6 MW   Functional Limitation Mobility: Walking and moving around    Mobility: Walking and Moving Around Current Status [phone]) At least 40 percent but less than 60 percent impaired, limited or restricted   Mobility: Walking and Moving Around Goal Status [phone]) At least 20 percent but less than 40 percent impaired, limited or restricted       Problem List Patient Active Problem List   Diagnosis Date Noted  . OA (osteoarthritis) of knee 10/16/2016  . Status post mastectomy 01/15/2016  . Leg swelling 10/11/2015  . Neuropathy involving both lower extremities 06/19/2015  . Secondary localized osteoarthrosis, ankle and foot 06/01/2015  . Acne erythematosa 06/01/2015  . Avitaminosis D 06/01/2015  . Atrial tachycardia ([place]) 08/24/2014  . Essential hypertension 08/17/2012  . Adjustment disorder 08/17/2012  . History of breast cancer 08/17/2012   Alanson Puls, PT, DPT Arelia Sneddon S 04-16-17, 3:59 PM  [place] MAIN [facility] SERVICES [address] [place], [place], [address] Phone: [phone]   Fax:  [phone]  Name: Kelli Williams MRN: [id number] Date of Birth: 12-12-1925

## 2017-04-14 ENCOUNTER — Encounter: Payer: Self-pay | Admitting: Physical Therapy

## 2017-04-14 ENCOUNTER — Ambulatory Visit: Payer: Medicare Other | Attending: Orthopedic Surgery | Admitting: Physical Therapy

## 2017-04-14 DIAGNOSIS — M6281 Muscle weakness (generalized): Secondary | ICD-10-CM

## 2017-04-14 DIAGNOSIS — R262 Difficulty in walking, not elsewhere classified: Secondary | ICD-10-CM | POA: Diagnosis not present

## 2017-04-14 NOTE — Patient Instructions (Addendum)
Abduction: Clam (Eccentric) - Side-Lying    Lie on side with knees bent. Lift top knee, keeping feet together. Keep trunk steady. Slowly lower for 3-5 seconds. __2_ reps per set, __15_ sets per day, 2___ days per week. Add ___ lbs when you achieve ___ repetitions.  http://ecce.exer.us/65   Copyright  VHI. All rights reserved.  Abduction: Side Leg Lift (Eccentric) - Side-Lying    Lie on side. Lift top leg slightly higher than shoulder level. Keep top leg straight with body, toes pointing forward. Slowly lower for 3-5 seconds. __15_ reps per set, 2___ sets per day, __7_ days per week. Add 2___ lbs when you achieve _10__ repetitions.  http://ecce.exer.us/63   Copyright  VHI. All rights reserved.  Gluteals (Stand)    Stand on floor for tubing, on step for weights. Place tubing or ____ pound weight on right ankle. Keeping knee straight, pull leg backward. Keep head and back straight. Keep leg with weight off floor. Hold 2____ seconds. Repeat _15___ times. Do _2___ sessions per day. CAUTION: Move slowly. May lightly hold chair for stability.  Copyright  VHI. All rights reserved.  Abdominals: Single Leg Bend    Lying on back with legs out straight, inhale, then exhale while slowly sliding heel along floor toward buttocks. Slowly return to starting position. Repeat _15___ times each leg per set. Do __2__ sets per session. Do _2___ sessions per day.  Copyright  VHI. All rights reserved.  Heel Raises    Stand with support. Tighten pelvic floor and hold. With knees straight, raise heels off ground. Hold ___ seconds. Relax for 2___ seconds. Repeat __15_ times. Do _2__ times a day.  Copyright  VHI. All rights reserved.

## 2017-04-14 NOTE — Therapy (Signed)
[place] MAIN [facility] SERVICES [address] [place], [place], [address] Phone: [phone]   Fax:  [phone]  Physical Therapy Treatment  Patient Details  Name: Kelli Williams MRN: [id number] Date of Birth: [birth year] Referring Provider: Hessie Knows  Encounter Date: 04/14/2017      PT End of Session - 04/14/17 0936    Visit Number 2   Number of Visits 17   Date for PT Re-Evaluation 06/02/17   Authorization Type 2/10   PT Start Time 0930   PT Stop Time 1010   PT Time Calculation (min) 40 min   Equipment Utilized During Treatment Gait belt   Activity Tolerance Patient tolerated treatment well;Patient limited by fatigue;Patient limited by pain   Behavior During Therapy WFL for tasks assessed/performed      Past Medical History:  Diagnosis Date  . Cancer ([place])   . Hypertension   . Neuropathic pain of both legs   . Neuropathy   . Skin cancer of forehead   . Skin cancer of nose     Past Surgical History:  Procedure Laterality Date  . ABDOMINAL HYSTERECTOMY    . BREAST LUMPECTOMY     left  . BREAST LUMPECTOMY    . CATARACT EXTRACTION     left eye  . GALLBLADDER SURGERY    . PARTIAL KNEE ARTHROPLASTY     right  . right knee replacement    . TOTAL VAGINAL HYSTERECTOMY      There were no vitals filed for this visit.      Subjective Assessment - 04/14/17 0933    Subjective Patient thiinks that her hip is the problem because she is weak, but her feet hurt the most and also both knees hurt.    Patient is accompained by: Family member   Pertinent History Patient had R TKR 7 years ago, she fractured her right foot 3 years ago and wore a boot for 4 months. She strainned her left  knee while her right foot was healing. She went to the MD and they dont want to do any surgery on the left knee. She has had 2 falls in the last 6 months , She lives at the village of [place] and her husbance died.    Limitations Standing;Walking    How long can you sit comfortably? unlimited   How long can you stand comfortably? 30 mins   How long can you walk comfortably? 15 mins   Diagnostic tests x- ray BLE hips and knees   Patient Stated Goals Patient wants to be able to not fall.    Currently in Pain? Yes   Pain Score 7    Pain Location Knee   Pain Orientation Right;Left   Pain Descriptors / Indicators Aching   Pain Type Chronic pain   Pain Onset More than a month ago   Pain Frequency Constant   Aggravating Factors  sit to stand   Pain Relieving Factors medicine   Effect of Pain on Daily Activities unable to do as much walking   Multiple Pain Sites --  both feet hurt 10/10       Therapeutic Exercise:  Standing exercises with RTB BLE : Marching 2 x 10;; cues to raise up knees higher,BLE  SLR 2 x 10; cues for performing slowly, BLE  Abduction 2 x 10; cues for not leaning towards the opposite side, BLE  Extension 2 x 10; cues not to lean forward with trunk, BLE  Heel raises 2  x 10; cues to not rock forward, BLE  Eccentric step-downs x 10 BLE; cues to not put too much weight bearing on UE's Resisted side-steeping RTB 4 lengths x 2;; cues to not rotate trunk towards direction of mobility Sidelying hip abd x 15 with knees flexed and then knees straight ; BLE  Bridging x 15 x 2 sets Step-ups to 6" step x 10 bilateral; cues for getting entire foot on step Quantum leg press 100 # x 20 x 3; cues for not snapping LE in extension and performing slowly Patient needs  increased time to complete task                           PT Education - 04/14/17 0935    Education provided Yes   Education Details HEP    Person(s) Educated Patient   Methods Explanation   Comprehension Verbalized understanding             PT Long Term Goals - 04/07/17 1557      PT LONG TERM GOAL #1   Title Patient will be independent in home exercise program to improve strength/mobility for better functional independence with  ADLs.   Baseline no HEP   Time 8   Period Weeks   Status New     PT LONG TERM GOAL #2   Title Patient (> 81 years old) will complete five times sit to stand test in < 15 seconds indicating an increased LE strength and improved balance.   Baseline 25.74 sec   Time 8   Period Weeks   Status New     PT LONG TERM GOAL #3   Title Patient will increase six minute walk test distance to >1000 for progression to community ambulator and improve gait ability   Baseline 425 feet   Time 8   Period Weeks   Status New     PT LONG TERM GOAL #4   Title Patient will increase 10 meter walk test to >1.81m/s as to improve gait speed for better community ambulation and to reduce fall risk.   Baseline .56 m/sec   Period Weeks   Status New     PT LONG TERM GOAL #5   Title Patient will increase BLE gross strength to 4+/5 as to improve functional strength for independent gait, increased standing tolerance and increased ADL ability.   Baseline -3/5 strength B hips flex and aabd   Period Weeks   Status New               Plan - 04/14/17 4970    Clinical Impression Statement Patient required min verbal cueing during  standing and supine exercises  and required CGA during all dynamic standing balance activities. BLE hip strength is 3/5 hip abd and hip ext and hip add. Patient required occasional rest breaks between exercises due to fatigue. Patient tolerated exercise well with minimal increase in knee pain during standing exercises.  Patient will continue to benefit from skilled therapy in order to improve her dynamic standing balance activities and increase gait speed to reduce her risk for falls   Rehab Potential Good   Clinical Impairments Affecting Rehab Potential This patient presents with 2 personal factors/ comorbidities current situation and hx of falls; and 3  body elements including body structures and functions, activity limitations and or participation restrictions: decreased strength BLE ,  decreased transfers sit to stand, decreased dynamic and static standing balance, decreased gait.  Patient's  condition is evolving.   PT Frequency 2x / week   PT Duration 8 weeks   PT Treatment/Interventions Balance training;Therapeutic exercise;Therapeutic activities;Patient/family education   PT Next Visit Plan strengthening and balance training   PT Home Exercise Plan hip abd standing and sidelying   Consulted and Agree with Plan of Care Patient      Patient will benefit from skilled therapeutic intervention in order to improve the following deficits and impairments:  Abnormal gait, Decreased balance, Decreased endurance, Decreased mobility, Difficulty walking, Decreased activity tolerance, Decreased safety awareness, Decreased strength, Pain  Visit Diagnosis: Muscle weakness (generalized)  Difficulty in walking, not elsewhere classified     Problem List Patient Active Problem List   Diagnosis Date Noted  . OA (osteoarthritis) of knee 10/16/2016  . Status post mastectomy 01/15/2016  . Leg swelling 10/11/2015  . Neuropathy involving both lower extremities 06/19/2015  . Secondary localized osteoarthrosis, ankle and foot 06/01/2015  . Acne erythematosa 06/01/2015  . Avitaminosis D 06/01/2015  . Atrial tachycardia ([place]) 08/24/2014  . Essential hypertension 08/17/2012  . Adjustment disorder 08/17/2012  . History of breast cancer 08/17/2012   Alanson Puls, PT, DPT Arelia Sneddon S 04/14/2017, 10:18 AM  [place] MAIN [facility] SERVICES [address] [place], [place], [address] Phone: [phone]   Fax:  [phone]  Name: MEAH JIRON MRN: [id number] Date of Birth: 10-22-1926

## 2017-04-16 ENCOUNTER — Ambulatory Visit: Payer: Medicare Other | Admitting: Physical Therapy

## 2017-04-16 ENCOUNTER — Encounter: Payer: Self-pay | Admitting: Physical Therapy

## 2017-04-16 DIAGNOSIS — M6281 Muscle weakness (generalized): Secondary | ICD-10-CM

## 2017-04-16 DIAGNOSIS — R262 Difficulty in walking, not elsewhere classified: Secondary | ICD-10-CM

## 2017-04-16 NOTE — Therapy (Signed)
[place] MAIN [facility] SERVICES [address] [place], [place], [address] Phone: [phone]   Fax:  [phone]  Physical Therapy Treatment  Patient Details  Name: Kelli Williams MRN: [id number] Date of Birth: 20-Feb-1926 Referring Provider: Hessie Knows  Encounter Date: 04/16/2017      PT End of Session - 04/16/17 1033    Visit Number 3   Number of Visits 17   Date for PT Re-Evaluation 06/02/17   Authorization Type 3/10   PT Start Time 1032   PT Stop Time 1114   PT Time Calculation (min) 42 min   Equipment Utilized During Treatment Gait belt   Activity Tolerance Patient tolerated treatment well;Patient limited by fatigue;Patient limited by pain   Behavior During Therapy WFL for tasks assessed/performed      Past Medical History:  Diagnosis Date  . Cancer ([place])   . Hypertension   . Neuropathic pain of both legs   . Neuropathy   . Skin cancer of forehead   . Skin cancer of nose     Past Surgical History:  Procedure Laterality Date  . ABDOMINAL HYSTERECTOMY    . BREAST LUMPECTOMY     left  . BREAST LUMPECTOMY    . CATARACT EXTRACTION     left eye  . GALLBLADDER SURGERY    . PARTIAL KNEE ARTHROPLASTY     right  . right knee replacement    . TOTAL VAGINAL HYSTERECTOMY      There were no vitals filed for this visit.      Subjective Assessment - 04/16/17 1036    Subjective Pt reports her lower back was sore after last session.     Patient is accompained by: Family member   Pertinent History Patient had R TKR 7 years ago, she fractured her right foot 3 years ago and wore a boot for 4 months. She strainned her left  knee while her right foot was healing. She went to the MD and they dont want to do any surgery on the left knee. She has had 2 falls in the last 6 months , She lives at the [place] of [place] and her husbance died.    Limitations Standing;Walking   How long can you sit comfortably? unlimited   How long can you  stand comfortably? 30 mins   How long can you walk comfortably? 15 mins   Diagnostic tests x- ray BLE hips and knees   Patient Stated Goals Patient wants to be able to not fall.    Currently in Pain? Yes   Pain Score 8    Pain Location Back   Pain Orientation Left;Right;Lower   Pain Descriptors / Indicators Aching   Pain Type Chronic pain   Pain Onset More than a month ago   Pain Frequency Constant   Multiple Pain Sites Yes   Pain Score 5   Pain Location Knee   Pain Orientation Left;Right   Pain Descriptors / Indicators Aching   Pain Type Chronic pain   Pain Onset More than a month ago   Pain Frequency Constant   Pain Score 9   Pain Location Foot   Pain Orientation Right   Pain Descriptors / Indicators Aching   Pain Type Chronic pain   Pain Onset More than a month ago   Pain Frequency Constant      Therapeutic Exercise:  Marching 2 x 10; cues to raise up knees higher, BLE. RTB for resistance with second set.  Sit<>stand  with cues to power up to standing 2x15. Pt uses armrests Bil on first set, no UE support on second set.  Alternating step ups to 6" step x20 each LE  Lateral stepping over  foam roll x20 each direction  Standing Bil hip E x15 each LE  Lateral walking with cues for slight knee bend with BUE support x10 lengths in // bars  Heel raises 2 x 10; cues to not rock forward, BLE. BUE support. Cues to raise heels as high as possible.  Toe raises 2x10 with BUE support  Pt ambulated 100 ft in gym with rollator and cues for upright posture and forward gaze. Cues to not WB heavily on RW but rather push it along to use in case she were to lose her balance.  Quantum leg press 105 # 1x20 and 120# 1x15; cues for eccentric control and to avoid Bil knee hyperextension   Cues for upright posture throughout session and education provided on relationship between posture and back pain             PT Education - 04/16/17 1033    Education provided Yes   Education  Details Exercise technique; correct posture; relationship between posture and back pain   Person(s) Educated Patient   Methods Explanation;Demonstration   Comprehension Verbalized understanding;Returned demonstration;Need further instruction;Verbal cues required             PT Long Term Goals - 04/07/17 1557      PT LONG TERM GOAL #1   Title Patient will be independent in home exercise program to improve strength/mobility for better functional independence with ADLs.   Baseline no HEP   Time 8   Period Weeks   Status New     PT LONG TERM GOAL #2   Title Patient (> 20 years old) will complete five times sit to stand test in < 15 seconds indicating an increased LE strength and improved balance.   Baseline 25.74 sec   Time 8   Period Weeks   Status New     PT LONG TERM GOAL #3   Title Patient will increase six minute walk test distance to >1000 for progression to community ambulator and improve gait ability   Baseline 425 feet   Time 8   Period Weeks   Status New     PT LONG TERM GOAL #4   Title Patient will increase 10 meter walk test to >1.94m/s as to improve gait speed for better community ambulation and to reduce fall risk.   Baseline .56 m/sec   Period Weeks   Status New     PT LONG TERM GOAL #5   Title Patient will increase BLE gross strength to 4+/5 as to improve functional strength for independent gait, increased standing tolerance and increased ADL ability.   Baseline -3/5 strength B hips flex and aabd   Period Weeks   Status New               Plan - 04/16/17 1054    Clinical Impression Statement Pt presents with flexed posture and requires frequent verbal cues for upright posture and education provided on relationship between posture and back pain.  She demonstrates instability with therapeutic exercises and balance exercises but tolerates all interventions well.  She will benefit from continued skilled PT interventions for improved balance, posture,  strength, and QOL.   Rehab Potential Good   Clinical Impairments Affecting Rehab Potential This patient presents with 2 personal factors/ comorbidities current situation and  hx of falls; and 3  body elements including body structures and functions, activity limitations and or participation restrictions: decreased strength BLE , decreased transfers sit to stand, decreased dynamic and static standing balance, decreased gait.  Patient's condition is evolving.   PT Frequency 2x / week   PT Duration 8 weeks   PT Treatment/Interventions Balance training;Therapeutic exercise;Therapeutic activities;Patient/family education   PT Next Visit Plan strengthening and balance training   PT Home Exercise Plan hip abd standing and sidelying   Consulted and Agree with Plan of Care Patient      Patient will benefit from skilled therapeutic intervention in order to improve the following deficits and impairments:  Abnormal gait, Decreased balance, Decreased endurance, Decreased mobility, Difficulty walking, Decreased activity tolerance, Decreased safety awareness, Decreased strength, Pain  Visit Diagnosis: Muscle weakness (generalized)  Difficulty in walking, not elsewhere classified     Problem List Patient Active Problem List   Diagnosis Date Noted  . OA (osteoarthritis) of knee 10/16/2016  . Status post mastectomy 01/15/2016  . Leg swelling 10/11/2015  . Neuropathy involving both lower extremities 06/19/2015  . Secondary localized osteoarthrosis, ankle and foot 06/01/2015  . Acne erythematosa 06/01/2015  . Avitaminosis D 06/01/2015  . Atrial tachycardia ([place]) 08/24/2014  . Essential hypertension 08/17/2012  . Adjustment disorder 08/17/2012  . History of breast cancer 08/17/2012     Collie Siad PT, DPT 04/16/2017, 11:15 AM  [place] MAIN [facility] SERVICES [address] [place], [place], [address] Phone: [phone]   Fax:  [phone]  Name: Kelli Williams MRN: [id number] Date of Birth: 09-06-1926

## 2017-04-17 ENCOUNTER — Other Ambulatory Visit: Payer: Self-pay

## 2017-04-17 ENCOUNTER — Encounter: Payer: Self-pay | Admitting: Gastroenterology

## 2017-04-17 ENCOUNTER — Ambulatory Visit (INDEPENDENT_AMBULATORY_CARE_PROVIDER_SITE_OTHER): Payer: Medicare Other | Admitting: Gastroenterology

## 2017-04-17 VITALS — BP 166/80 | HR 56 | Temp 98.0°F | Ht 65.0 in | Wt 197.0 lb

## 2017-04-17 DIAGNOSIS — K582 Mixed irritable bowel syndrome: Secondary | ICD-10-CM

## 2017-04-17 MED ORDER — OMEPRAZOLE 20 MG PO CPDR: 20.0000 mg | DELAYED_RELEASE_CAPSULE | Freq: Every day | ORAL | 1 refills | Status: DC

## 2017-04-17 NOTE — Progress Notes (Signed)
Gastroenterology Consultation  Referring Provider:     Glean Hess, MD Primary Care Physician:  Glean Hess, MD Primary Gastroenterologist:  Dr. Allen Norris     Reason for Consultation:     Abdominal pain and diarrhea        HPI:   PERL FOLMAR is a 83 y.o. y/o female referred for consultation & management of Abdominal pain and diarrhea by Dr. Army Melia, Jesse Sans, MD.  This patient comes in today with a history of abdominal pain and diarrhea it appears that the patient has been communicating with another Gastroenterology practice between April 6 and April 9 with the same issues.  The patient reports that she had been told many years ago when she lived in [place] that she had irritable bowel syndrome. The patient states that now she'll have bouts of diarrheathat with hard lumpy stools and marblelike stools. She'll also reports that she'll have normal bowel movements. The patient states her soft bowel movements approximate once a week. She also has abdominal pain in the left and right lower abdomen that she also states has been told to her was caused by her spastic colon. The patient denies any unexplained weight loss fevers chills nausea or vomiting. The patient also reports that she takes Tums frequently because of her heartburn.  Past Medical History:  Diagnosis Date  . Cancer ([place])   . Hypertension   . Neuropathic pain of both legs   . Neuropathy   . Skin cancer of forehead   . Skin cancer of nose     Past Surgical History:  Procedure Laterality Date  . ABDOMINAL HYSTERECTOMY    . BREAST LUMPECTOMY     left  . BREAST LUMPECTOMY    . CATARACT EXTRACTION     left eye  . GALLBLADDER SURGERY    . PARTIAL KNEE ARTHROPLASTY     right  . right knee replacement    . TOTAL VAGINAL HYSTERECTOMY      Prior to Admission medications   Medication Sig Start Date End Date Taking? Authorizing Provider  aspirin 81 MG tablet Take 1 tablet by mouth daily.    [provider]    B Complex-Folic Acid (BENFOTIAMINE MULTI-B) CAPS Take 1 tablet by mouth daily.    [provider]  Calcium-Magnesium 500-250 MG TABS Take by mouth daily.    [provider]  Cholecalciferol 1000 UNITS capsule Take 1 capsule by mouth daily.    [provider]  Coenzyme Q10 (CO Q-10) 100 MG CAPS Take by mouth daily.    [provider]  gabapentin (NEURONTIN) 300 MG capsule Take 3 capsules (900 mg total) by mouth 2 (two) times daily. 03/06/17   Glean Hess, MD  ibuprofen (ADVIL,MOTRIN) 600 MG tablet Take 600 mg by mouth every 6 (six) hours as needed.    [provider]  lisinopril-hydrochlorothiazide (PRINZIDE,ZESTORETIC) 20-25 MG tablet TAKE 1 AND 1/2 TABLETS BY  MOUTH DAILY 01/16/17   Glean Hess, MD  Multiple Vitamins-Minerals (MULTIVITAMIN WITH MINERALS) tablet Take 1 tablet by mouth daily.    [provider]  Omega-3 Fatty Acids (FISH OIL CONCENTRATE) 300 MG CAPS Take by mouth. 08/08/10   [provider]  propranolol (INDERAL) 10 MG tablet Take 1 tablet by mouth up  to 3 times daily as needed 02/03/16   Glean Hess, MD    Family History  Problem Relation Age of Onset  . Heart disease Mother   . Heart attack  Paternal Uncle      Social History  Substance Use Topics  . Smoking status: Never Smoker  . Smokeless tobacco: Never Used  . Alcohol use No    Allergies as of 04/17/2017 - Review Complete 04/16/2017  Allergen Reaction Noted  . Amitriptyline Hives 06/19/2015  . Amoxicillin  01/09/2017  . Sulfa antibiotics Rash 05/17/2012  . Sulfa antibiotics Rash 10/03/2014    Review of Systems:    All systems reviewed and negative except where noted in HPI.   Physical Exam:  There were no vitals taken for this visit. No LMP recorded. Patient is postmenopausal. Psych:  Alert and cooperative. Normal mood and affect. General:   Alert,  Well-developed, well-nourished, pleasant and cooperative in NAD Head:   Normocephalic and atraumatic. Eyes:  Sclera clear, no icterus.   Conjunctiva pink. Ears:  Normal auditory acuity. Nose:  No deformity, discharge, or lesions. Mouth:  No deformity or lesions,oropharynx pink & moist. Neck:  Supple; no masses or thyromegaly. Lungs:  Respirations even and unlabored.  Clear throughout to auscultation.   No wheezes, crackles, or rhonchi. No acute distress. Heart:  Regular rate and rhythm; no murmurs, clicks, rubs, or gallops. Abdomen:  Normal bowel sounds.  No bruits.  Soft, non-tender and non-distended without masses, hepatosplenomegaly or hernias noted.  No guarding or rebound tenderness.  Negative Carnett sign.   Rectal:  Deferred.  Msk:  Symmetrical without gross deformities.  Good, equal movement & strength bilaterally. Pulses:  Normal pulses noted. Extremities:  No clubbing or edema.  No cyanosis. Neurologic:  Alert and oriented x3;  grossly normal neurologically. Skin:  Intact without significant lesions or rashes.  No jaundice. Lymph Nodes:  No significant cervical adenopathy. Psych:  Alert and cooperative. Normal mood and affect.  Imaging Studies: No results found.  Assessment and Plan:   TOMIKA ECKLES is a 83 y.o. y/o female who has a history of irritable bowel syndrome who now comes in with alternating diarrhea constipation and normal stools. The patient has been given samples of fiber and has been told to take Citrucel once or twice a day as needed. The patient has also been told to start taking omeprazole for which she is being given a prescription for. The patient has been told that with her alternating symptoms it is likely due to her irritable bowel syndrome. The patient also reports that her symptoms are worse when she is under a lot of stress. She will contact me if her symptoms do not improve. The patient has been explained the plan and agrees with it.    Lucilla Lame, MD. Marval Regal   Note: This dictation was prepared with Dragon dictation  along with smaller phrase technology. Any transcriptional errors that result from this process are unintentional.

## 2017-04-21 ENCOUNTER — Ambulatory Visit: Payer: Medicare Other | Admitting: Physical Therapy

## 2017-04-21 ENCOUNTER — Encounter: Payer: Self-pay | Admitting: Physical Therapy

## 2017-04-21 DIAGNOSIS — R262 Difficulty in walking, not elsewhere classified: Secondary | ICD-10-CM

## 2017-04-21 DIAGNOSIS — M6281 Muscle weakness (generalized): Secondary | ICD-10-CM | POA: Diagnosis not present

## 2017-04-21 NOTE — Therapy (Addendum)
[place] MAIN [facility] SERVICES [address] [place], [place], [address] Phone: [phone]   Fax:  [phone]  Physical Therapy Treatment  Patient Details  Name: Kelli Williams MRN: [id number] Date of Birth: [birth year] Referring Provider: Hessie Knows  Encounter Date: 04/21/2017      PT End of Session - 04/21/17 0957    Visit Number 4   Number of Visits 17   Date for PT Re-Evaluation 06/02/17   Authorization Type 4/10   PT Start Time 1040   PT Stop Time 1118   PT Time Calculation (min) 38 min   Equipment Utilized During Treatment Gait belt   Activity Tolerance Patient tolerated treatment well;Patient limited by fatigue;Patient limited by pain   Behavior During Therapy WFL for tasks assessed/performed      Past Medical History:  Diagnosis Date  . Cancer ([place])   . Hypertension   . Neuropathic pain of both legs   . Neuropathy   . Skin cancer of forehead   . Skin cancer of nose     Past Surgical History:  Procedure Laterality Date  . ABDOMINAL HYSTERECTOMY    . BREAST LUMPECTOMY     left  . BREAST LUMPECTOMY    . CATARACT EXTRACTION     left eye  . GALLBLADDER SURGERY    . PARTIAL KNEE ARTHROPLASTY     right  . right knee replacement    . TOTAL VAGINAL HYSTERECTOMY      There were no vitals filed for this visit.      Subjective Assessment - 04/21/17 0954    Subjective Pt reports her knees are hurting and her feet are very painful today. She is doing her HEP.    Patient is accompained by: Family member   Pertinent History Patient had R TKR 7 years ago, she fractured her right foot 3 years ago and wore a boot for 4 months. She strainned her left  knee while her right foot was healing. She went to the MD and they dont want to do any surgery on the left knee. She has had 2 falls in the last 6 months , She lives at the village of [place] and her husbance died.    Limitations Standing;Walking   How long can you sit  comfortably? unlimited   How long can you stand comfortably? 30 mins   How long can you walk comfortably? 15 mins   Diagnostic tests x- ray BLE hips and knees   Patient Stated Goals Patient wants to be able to not fall.    Currently in Pain? Yes   Pain Score 10-Worst pain ever   Pain Location Foot   Pain Orientation Right;Left   Pain Descriptors / Indicators Aching   Pain Type Chronic pain   Pain Onset More than a month ago   Pain Frequency Constant   Aggravating Factors  standing   Pain Relieving Factors medicine   Effect of Pain on Daily Activities unable to walk as much   Multiple Pain Sites Yes   Pain Score 5   Pain Location Knee   Pain Orientation Right;Left   Pain Descriptors / Indicators Aching   Pain Onset More than a month ago   Pain Frequency Constant   Pain Onset More than a month ago      Therapeutic Exercise:   Heel raises 2 x 10; cues to not rock forward, BLE  Modified lunges in parallel bars x 10 BLE Side tapping and fwd  tapping in parallel bars with BLE x 20  Eccentric step-downs x 10 BLE; cues to not put too much weight bearing on UE's Resisted side-steeping RTB 4 lengths x 2;; cues to not rotate trunk towards direction of mobility Step-ups to 6" step x 10 bilateral; cues for getting entire foot on step Quantum leg press 100 # x 20 x 3; cues for not snapping LE in extension and performing slowly, heel raises on leg press with 60 lbs x 20 x 3  Patient needs  increased time to complete task and correct technique for position and instruction for exercises. Neuromuscular training: Standing on foam with feet apart and feet together with head turns and trunk rotation left and right x 2 minutes feet apart, feet together and with trunk rotation Side stepping and backwards walking in parallel bars left and right x 5 repetitions  Patient needs CGA for regaining balance during side stepping and backwards walking. Patient needs cues for upright posture and correct head  position. She has pain throughout treatment but does not report increased pain in feet or knees bilaterally.                           PT Education - 04/21/17 0956    Education provided Yes   Education Details exercise technique   Person(s) Educated Patient   Methods Explanation;Demonstration   Comprehension Verbalized understanding;Returned demonstration;Verbal cues required             PT Long Term Goals - 04/07/17 1557      PT LONG TERM GOAL #1   Title Patient will be independent in home exercise program to improve strength/mobility for better functional independence with ADLs.   Baseline no HEP   Time 8   Period Weeks   Status New     PT LONG TERM GOAL #2   Title Patient (> 81 years old) will complete five times sit to stand test in < 15 seconds indicating an increased LE strength and improved balance.   Baseline 25.74 sec   Time 8   Period Weeks   Status New     PT LONG TERM GOAL #3   Title Patient will increase six minute walk test distance to >1000 for progression to community ambulator and improve gait ability   Baseline 425 feet   Time 8   Period Weeks   Status New     PT LONG TERM GOAL #4   Title Patient will increase 10 meter walk test to >1.33m/s as to improve gait speed for better community ambulation and to reduce fall risk.   Baseline .56 m/sec   Period Weeks   Status New     PT LONG TERM GOAL #5   Title Patient will increase BLE gross strength to 4+/5 as to improve functional strength for independent gait, increased standing tolerance and increased ADL ability.   Baseline -3/5 strength B hips flex and aabd   Period Weeks   Status New               Plan - 04/21/17 3151    Clinical Impression Statement Patient required min verbal cueing during standing and balance exercises and required CGA during all dynamic standing balance activities. She is able to perform closed chain and open chain exercises.  Patient required  occasional rest breaks between exercises due to fatigue. Patient tolerated exercise well with no increase in knee pain during standing exercises. Patient will continue to  benefit from skilled therapy in order to improve her dynamic standing balance activities and increase gait speed to reduce her risk for falls   Rehab Potential Good   Clinical Impairments Affecting Rehab Potential This patient presents with 2 personal factors/ comorbidities current situation and hx of falls; and 3  body elements including body structures and functions, activity limitations and or participation restrictions: decreased strength BLE , decreased transfers sit to stand, decreased dynamic and static standing balance, decreased gait.  Patient's condition is evolving.   PT Frequency 2x / week   PT Duration 8 weeks   PT Treatment/Interventions Balance training;Therapeutic exercise;Therapeutic activities;Patient/family education   PT Next Visit Plan strengthening and balance training   PT Home Exercise Plan hip abd standing and sidelying   Consulted and Agree with Plan of Care Patient      Patient will benefit from skilled therapeutic intervention in order to improve the following deficits and impairments:  Abnormal gait, Decreased balance, Decreased endurance, Decreased mobility, Difficulty walking, Decreased activity tolerance, Decreased safety awareness, Decreased strength, Pain  Visit Diagnosis: Muscle weakness (generalized)  Difficulty in walking, not elsewhere classified     Problem List Patient Active Problem List   Diagnosis Date Noted  . OA (osteoarthritis) of knee 10/16/2016  . Status post mastectomy 01/15/2016  . Leg swelling 10/11/2015  . Neuropathy involving both lower extremities 06/19/2015  . Secondary localized osteoarthrosis, ankle and foot 06/01/2015  . Acne erythematosa 06/01/2015  . Avitaminosis D 06/01/2015  . Atrial tachycardia ([place]) 08/24/2014  . Essential hypertension 08/17/2012  .  Adjustment disorder 08/17/2012  . History of breast cancer 08/17/2012   Alanson Puls, PT, DPT Arelia Sneddon S 04/21/2017, 10:01 AM  [place] MAIN [facility] SERVICES [address] [place], [place], [address] Phone: [phone]   Fax:  [phone]  Name: ILEANE SANDO MRN: [id number] Date of Birth: [birth year]

## 2017-04-22 ENCOUNTER — Encounter: Payer: Self-pay | Admitting: Internal Medicine

## 2017-04-22 ENCOUNTER — Ambulatory Visit (INDEPENDENT_AMBULATORY_CARE_PROVIDER_SITE_OTHER): Payer: Medicare Other | Admitting: Internal Medicine

## 2017-04-22 VITALS — BP 128/66 | HR 54 | Temp 98.3°F | Resp 12 | Ht 64.0 in | Wt 197.0 lb

## 2017-04-22 DIAGNOSIS — J3089 Other allergic rhinitis: Secondary | ICD-10-CM | POA: Diagnosis not present

## 2017-04-22 DIAGNOSIS — M170 Bilateral primary osteoarthritis of knee: Secondary | ICD-10-CM | POA: Diagnosis not present

## 2017-04-22 NOTE — Patient Instructions (Signed)
Continue Singulair and Zyrtec daily

## 2017-04-22 NOTE — Progress Notes (Signed)
Date:  04/22/2017   Name:  Kelli Williams   DOB:  02-20-1926   MRN:  [id number]   Chief Complaint: Cough (Yellow production- X 1 month. No blood. Mostly has yellow production in the morning. ) Cough  This is a new problem. The current episode started 1 to 4 weeks ago. The problem has been unchanged. The problem occurs every few minutes. The cough is productive of sputum. Pertinent negatives include no chest pain, chills, fever, headaches, sore throat, shortness of breath or wheezing. She has tried leukotriene antagonists for the symptoms.  Coughs up scant yellow mucus in the morning.  No cough during the day.  Still has some runny nose without congestion with clear mucus during the day. She is taking singulair and zyrtec but not regularly.  She is not using any flonase spray.    Review of Systems  Constitutional: Negative for chills, fatigue and fever.  HENT: Positive for voice change. Negative for congestion, sinus pain, sinus pressure, sore throat and trouble swallowing.   Respiratory: Positive for cough. Negative for chest tightness, shortness of breath and wheezing.   Cardiovascular: Negative for chest pain and palpitations.  Gastrointestinal: Negative for abdominal pain.  Musculoskeletal: Positive for arthralgias and gait problem.  Neurological: Positive for light-headedness. Negative for dizziness and headaches.    Patient Active Problem List   Diagnosis Date Noted  . OA (osteoarthritis) of knee 10/16/2016  . Status post mastectomy 01/15/2016  . Leg swelling 10/11/2015  . Neuropathy involving both lower extremities 06/19/2015  . Secondary localized osteoarthrosis, ankle and foot 06/01/2015  . Acne erythematosa 06/01/2015  . Avitaminosis D 06/01/2015  . Atrial tachycardia ([place]) 08/24/2014  . Essential hypertension 08/17/2012  . Adjustment disorder 08/17/2012  . History of breast cancer 08/17/2012    Prior to Admission medications   Medication Sig Start Date End Date  Taking? Authorizing Provider  aspirin 81 MG tablet Take 1 tablet by mouth daily.   Yes [provider]  B Complex-Folic Acid (BENFOTIAMINE MULTI-B) CAPS Take 1 tablet by mouth daily.   Yes [provider]  Calcium-Magnesium 500-250 MG TABS Take by mouth daily.   Yes [provider]  Cholecalciferol 1000 UNITS capsule Take 1 capsule by mouth daily.   Yes [provider]  Coenzyme Q10 (CO Q-10) 100 MG CAPS Take by mouth daily.   Yes [provider]  DOCOSAHEXAENOIC ACID PO Take by mouth.   Yes [provider]  gabapentin (NEURONTIN) 300 MG capsule Take 3 capsules (900 mg total) by mouth 2 (two) times daily. 03/06/17  Yes Glean Hess, MD  ibuprofen (ADVIL,MOTRIN) 600 MG tablet Take 600 mg by mouth every 6 (six) hours as needed.   Yes [provider]  lisinopril-hydrochlorothiazide (PRINZIDE,ZESTORETIC) 20-25 MG tablet TAKE 1 AND 1/2 TABLETS BY  MOUTH DAILY 01/16/17  Yes Glean Hess, MD  montelukast (SINGULAIR) 10 MG tablet  01/15/17  Yes [provider]  Multiple Vitamins-Minerals (MULTIVITAMIN WITH MINERALS) tablet Take 1 tablet by mouth daily.   Yes [provider]  Omega-3 Fatty Acids (FISH OIL CONCENTRATE) 300 MG CAPS Take by mouth. 08/08/10  Yes [provider]  propranolol (INDERAL) 10 MG tablet Take 1 tablet by mouth up  to 3 times daily as needed 02/03/16  Yes Glean Hess, MD  thiamine 100 MG tablet Take by mouth.   Yes [provider]    Allergies  Allergen Reactions  . Amitriptyline Hives  . Amoxicillin  Diarrhea    . Sulfa Antibiotics Rash  . Sulfa Antibiotics Rash    Past Surgical History:  Procedure Laterality Date  . ABDOMINAL HYSTERECTOMY    . BREAST LUMPECTOMY     left  . BREAST LUMPECTOMY    . CATARACT EXTRACTION     left eye  . GALLBLADDER SURGERY    . PARTIAL KNEE ARTHROPLASTY     right  . right knee replacement    . TOTAL VAGINAL HYSTERECTOMY       Social History  Substance Use Topics  . Smoking status: Never Smoker  . Smokeless tobacco: Never Used  . Alcohol use No     Medication list has been reviewed and updated.   Physical Exam  Constitutional: She is oriented to person, place, and time. She appears well-developed. No distress.  HENT:  Head: Normocephalic and atraumatic.  Neck: Normal range of motion. Carotid bruit is not present. No thyromegaly present.  Cardiovascular: Normal rate and normal heart sounds.   Pulmonary/Chest: Effort normal and breath sounds normal. No respiratory distress. She has no wheezes.  Musculoskeletal: Normal range of motion.  Neurological: She is alert and oriented to person, place, and time.  Skin: Skin is warm and dry. No rash noted.  Psychiatric: She has a normal mood and affect. Her speech is normal and behavior is normal. Thought content normal.  Nursing note and vitals reviewed.   BP 128/66 (BP Location: Right Arm, Patient Position: Sitting, Cuff Size: Normal)   Pulse (!) 54   Temp 98.3 F (36.8 C) (Oral)   Resp 12   Ht 5\' 4"  (1.626 m)   Wt 197 lb (89.4 kg)   SpO2 96%   BMI 33.81 kg/m   Assessment and Plan: 1. Environmental and seasonal allergies Continue singulair and zyrtec No indication for antibiotics at this time  2. Primary osteoarthritis of both knees Continue PT and Ortho follow up   No orders of the defined types were placed in this encounter.   Halina Maidens, MD [facility]  04/22/2017

## 2017-04-23 ENCOUNTER — Ambulatory Visit: Payer: Medicare Other | Admitting: Physical Therapy

## 2017-04-23 ENCOUNTER — Encounter: Payer: Self-pay | Admitting: Physical Therapy

## 2017-04-23 DIAGNOSIS — R262 Difficulty in walking, not elsewhere classified: Secondary | ICD-10-CM | POA: Diagnosis not present

## 2017-04-23 DIAGNOSIS — M6281 Muscle weakness (generalized): Secondary | ICD-10-CM

## 2017-04-23 NOTE — Therapy (Addendum)
[place] MAIN [facility] SERVICES [address] [place], [place], [address] Phone: [phone]   Fax:  [phone]  Physical Therapy Treatment  Patient Details  Name: Kelli Williams MRN: [id number] Date of Birth: [birth year] Referring Provider: Hessie Knows  Encounter Date: 04/23/2017      PT End of Session - 04/23/17 1143    Visit Number 5   Number of Visits 17   Date for PT Re-Evaluation 06/02/17   Authorization Type 5/10   PT Start Time 1135   PT Stop Time 1213   PT Time Calculation (min) 38 min   Equipment Utilized During Treatment Gait belt   Activity Tolerance Patient tolerated treatment well;Patient limited by fatigue;Patient limited by pain   Behavior During Therapy WFL for tasks assessed/performed      Past Medical History:  Diagnosis Date  . Cancer ([place])   . Hypertension   . Neuropathic pain of both legs   . Neuropathy   . Skin cancer of forehead   . Skin cancer of nose     Past Surgical History:  Procedure Laterality Date  . ABDOMINAL HYSTERECTOMY    . BREAST LUMPECTOMY     left  . BREAST LUMPECTOMY    . CATARACT EXTRACTION     left eye  . GALLBLADDER SURGERY    . PARTIAL KNEE ARTHROPLASTY     right  . right knee replacement    . TOTAL VAGINAL HYSTERECTOMY      There were no vitals filed for this visit.      Subjective Assessment - 04/23/17 1141    Subjective Pt reports her knees are not as bad today. She feels like she does not have any energy.    Patient is accompained by: Family member   Pertinent History Patient had R TKR 7 years ago, she fractured her right foot 3 years ago and wore a boot for 4 months. She strainned her left  knee while her right foot was healing. She went to the MD and they dont want to do any surgery on the left knee. She has had 2 falls in the last 6 months , She lives at the [place] of [place] and her husbance died.    Limitations Standing;Walking   How long can you sit  comfortably? unlimited   How long can you stand comfortably? 30 mins   How long can you walk comfortably? 15 mins   Diagnostic tests x- ray BLE hips and knees   Patient Stated Goals Patient wants to be able to not fall.    Currently in Pain? Yes   Pain Score 7    Pain Location Knee   Pain Orientation Right;Left   Pain Descriptors / Indicators Aching   Pain Onset More than a month ago   Pain Frequency Constant   Aggravating Factors  standing   Pain Relieving Factors medicine   Effect of Pain on Daily Activities unable to walk as much   Multiple Pain Sites Yes  feet 8/10   Pain Score 8   Pain Location --  feet   Pain Orientation Right;Left   Pain Descriptors / Indicators Aching   Pain Onset More than a month ago   Pain Frequency Constant   Aggravating Factors  walking   Pain Relieving Factors nothing   Effect of Pain on Daily Activities difficult to walk   Pain Onset More than a month ago        Therapeutic Exercise:   Nu-step  level 3 x 5 mins, spm 60   Supine hip Abd/ER with GTB around knees, 3x10. Cues to control band during eccentric phase.   hooklying marching x 20 with cues to slow the exercise   SAQ with 5 sec hold x 20 BLE  sidelying clam x 20 BLE   sidelying hip abd x 10 BLE  Cues for correct technique and repetitions                          PT Education - 04/23/17 1143    Education Details posture with exercises   Person(s) Educated Patient   Methods Explanation   Comprehension Verbalized understanding;Returned demonstration;Verbal cues required             PT Long Term Goals - 04/07/17 1557      PT LONG TERM GOAL #1   Title Patient will be independent in home exercise program to improve strength/mobility for better functional independence with ADLs.   Baseline no HEP   Time 8   Period Weeks   Status New     PT LONG TERM GOAL #2   Title Patient (> 39 years old) will complete five times sit to stand test in < 15  seconds indicating an increased LE strength and improved balance.   Baseline 25.74 sec   Time 8   Period Weeks   Status New     PT LONG TERM GOAL #3   Title Patient will increase six minute walk test distance to >1000 for progression to community ambulator and improve gait ability   Baseline 425 feet   Time 8   Period Weeks   Status New     PT LONG TERM GOAL #4   Title Patient will increase 10 meter walk test to >1.36m/s as to improve gait speed for better community ambulation and to reduce fall risk.   Baseline .56 m/sec   Period Weeks   Status New     PT LONG TERM GOAL #5   Title Patient will increase BLE gross strength to 4+/5 as to improve functional strength for independent gait, increased standing tolerance and increased ADL ability.   Baseline -3/5 strength B hips flex and aabd   Period Weeks   Status New               Plan - 04/23/17 1144    Clinical Impression Statement Patient presents with continued loss of balance and weakness. Patient has deficits in strength of -3/5 BLE hip flexors, 3/5 BLE hip abd, 3/5 strength BLE knees,4/5 BLE ankles, gait with rollator and decreased gait speed ,dynamic standing balance deficits of poor and fair static standing balance. She is able to perform moderate level exercises for hips and knees in supine and sidelying positions with cues for correct technique. . Patient will benefit from continued skilled PT to improve dynamic standing balance, strength, gait, and reach functional goals.   Rehab Potential Good   Clinical Impairments Affecting Rehab Potential This patient presents with 2 personal factors/ comorbidities current situation and hx of falls; and 3  body elements including body structures and functions, activity limitations and or participation restrictions: decreased strength BLE , decreased transfers sit to stand, decreased dynamic and static standing balance, decreased gait.  Patient's condition is evolving.   PT Frequency 2x  / week   PT Duration 8 weeks   PT Treatment/Interventions Balance training;Therapeutic exercise;Therapeutic activities;Patient/family education   PT Next Visit Plan strengthening and balance  training   PT Home Exercise Plan hip abd standing and sidelying   Consulted and Agree with Plan of Care Patient      Patient will benefit from skilled therapeutic intervention in order to improve the following deficits and impairments:  Abnormal gait, Decreased balance, Decreased endurance, Decreased mobility, Difficulty walking, Decreased activity tolerance, Decreased safety awareness, Decreased strength, Pain  Visit Diagnosis: Muscle weakness (generalized)  Difficulty in walking, not elsewhere classified     Problem List Patient Active Problem List   Diagnosis Date Noted  . OA (osteoarthritis) of knee 10/16/2016  . Status post mastectomy 01/15/2016  . Leg swelling 10/11/2015  . Neuropathy involving both lower extremities 06/19/2015  . Secondary localized osteoarthrosis, ankle and foot 06/01/2015  . Acne erythematosa 06/01/2015  . Avitaminosis D 06/01/2015  . Atrial tachycardia ([place]) 08/24/2014  . Essential hypertension 08/17/2012  . Adjustment disorder 08/17/2012  . History of breast cancer 08/17/2012  Alanson Puls, PT, DPT  [place] S 04/23/2017, 11:52 AM  [place] MAIN [facility] SERVICES [address] [place], [place], [address] Phone: [phone]   Fax:  [phone]  Name: NICHOLL ONSTOTT MRN: [id number] Date of Birth: 1926/05/17

## 2017-04-28 ENCOUNTER — Ambulatory Visit: Payer: Medicare Other

## 2017-04-28 VITALS — BP 150/75 | HR 70

## 2017-04-28 DIAGNOSIS — R262 Difficulty in walking, not elsewhere classified: Secondary | ICD-10-CM

## 2017-04-28 DIAGNOSIS — M6281 Muscle weakness (generalized): Secondary | ICD-10-CM | POA: Diagnosis not present

## 2017-04-28 NOTE — Therapy (Signed)
[place] [facility] SERVICES [address] [place], [place], [address] Phone: [phone]   Fax:  [phone]  Physical Therapy Treatment  Patient Details  Name: Kelli Williams MRN: [id number] Date of Birth: Apr 21, 1926 Referring Provider: Hessie Knows  Encounter Date: 04/28/2017      PT End of Session - 04/28/17 1133    Visit Number 6   Number of Visits 17   Date for PT Re-Evaluation 06/02/17   Authorization Type 6/10   PT Start Time 1128   PT Stop Time 1213   PT Time Calculation (min) 45 min   Equipment Utilized During Treatment Gait belt   Activity Tolerance Patient tolerated treatment well;Patient limited by fatigue   Behavior During Therapy WFL for tasks assessed/performed      Past Medical History:  Diagnosis Date  . Cancer ([place])   . Hypertension   . Neuropathic pain of both legs   . Neuropathy   . Skin cancer of forehead   . Skin cancer of nose     Past Surgical History:  Procedure Laterality Date  . ABDOMINAL HYSTERECTOMY    . BREAST LUMPECTOMY     left  . BREAST LUMPECTOMY    . CATARACT EXTRACTION     left eye  . GALLBLADDER SURGERY    . PARTIAL KNEE ARTHROPLASTY     right  . right knee replacement    . TOTAL VAGINAL HYSTERECTOMY      Vitals:   04/28/17 1131  BP: (!) 150/75  Pulse: 70  SpO2: 97%        Subjective Assessment - 04/28/17 1132    Subjective Pt arrives complaining of R eye irritation that started this morning. She denies any eye injury. She is complianing of 6/10 bilateral knee pain as well. No specific questions upon arrival regarding her plan of care for therapy.    Pertinent History Patient had R TKR 7 years ago, she fractured her right foot 3 years ago and wore a boot for 4 months. She strainned her left  knee while her right foot was healing. She went to the MD and they dont want to do any surgery on the left knee. She has had 2 falls in the last 6 months , She lives at the [place] of  [place] and her husbance died.    How long can you sit comfortably? unlimited   How long can you stand comfortably? 30 mins   How long can you walk comfortably? 15 mins   Diagnostic tests x- ray BLE hips and knees   Patient Stated Goals Patient wants to be able to not fall.    Currently in Pain? Yes   Pain Score 6    Pain Location Knee   Pain Orientation Left;Right   Pain Descriptors / Indicators Aching   Pain Type Chronic pain   Pain Onset More than a month ago   Pain Frequency Constant   Aggravating Factors  standing   Pain Relieving Factors medicine   Effect of Pain on Daily Activities unable to walk as much        TREATMENT  Therapeutic Exercise Seated marches 2 x 15 bilateral; Sit to stand without UE support 2 x 10, cues required for proper technique, arms extended in front of body, and adequate anterior weight shifting; Quantum leg press 105# x 20, 120# x 20, no cues required for good form/technique, pt provided education about muscle fatigue and strength gains; Patient needs increased time to  complete task and correct technique for position and instruction for exercises, education provided regarding importance of HEP to improve strength;  Neuromuscular Re-education  Toe taps to 5" step 2 x 10; Feet together with horizontal and vertical head turns 30s x 2; Feet together with eyes closed 30s x 2; Side stepping in // bars without UE support x 4 lengths; Heel raises without UE support x 10; Discussed need for challenging conditions in order to see improvement in her balance, discussed importance of posture with respect to chronic low back pain, pt only reports mild increase in bilateral knee pain during session today;                        PT Education - 04/28/17 1133    Education provided Yes   Education Details Reinforced importance of HEP, discussed posture   Person(s) Educated Patient   Methods Explanation   Comprehension Verbalized  understanding             PT Long Term Goals - 04/07/17 1557      PT LONG TERM GOAL #1   Title Patient will be independent in home exercise program to improve strength/mobility for better functional independence with ADLs.   Baseline no HEP   Time 8   Period Weeks   Status New     PT LONG TERM GOAL #2   Title Patient (> 98 years old) will complete five times sit to stand test in < 15 seconds indicating an increased LE strength and improved balance.   Baseline 25.74 sec   Time 8   Period Weeks   Status New     PT LONG TERM GOAL #3   Title Patient will increase six minute walk test distance to >1000 for progression to community ambulator and improve gait ability   Baseline 425 feet   Time 8   Period Weeks   Status New     PT LONG TERM GOAL #4   Title Patient will increase 10 meter walk test to >1.55m/s as to improve gait speed for better community ambulation and to reduce fall risk.   Baseline .56 m/sec   Period Weeks   Status New     PT LONG TERM GOAL #5   Title Patient will increase BLE gross strength to 4+/5 as to improve functional strength for independent gait, increased standing tolerance and increased ADL ability.   Baseline -3/5 strength B hips flex and aabd   Period Weeks   Status New               Plan - 04/28/17 1133    Clinical Impression Statement Pt continues to demonstrate decreased bilateral hip strength as noted by Trendelenberg when in single leg stance. She has difficulty with posterior LOB during sit to stand training. Gait speed and reaction time is decreased. Discussed the importance of HEP in order to see improvement in her strength/balance. Pt encouraged to follow-up as scheduled.    Rehab Potential Good   Clinical Impairments Affecting Rehab Potential This patient presents with 2 personal factors/ comorbidities current situation and hx of falls; and 3  body elements including body structures and functions, activity limitations and or  participation restrictions: decreased strength BLE , decreased transfers sit to stand, decreased dynamic and static standing balance, decreased gait.  Patient's condition is evolving.   PT Frequency 2x / week   PT Duration 8 weeks   PT Treatment/Interventions Balance training;Therapeutic exercise;Therapeutic activities;Patient/family education  PT Next Visit Plan strengthening and balance training   PT Home Exercise Plan hip abd standing and sidelying   Consulted and Agree with Plan of Care Patient      Patient will benefit from skilled therapeutic intervention in order to improve the following deficits and impairments:  Abnormal gait, Decreased balance, Decreased endurance, Decreased mobility, Difficulty walking, Decreased activity tolerance, Decreased safety awareness, Decreased strength, Pain  Visit Diagnosis: Muscle weakness (generalized)  Difficulty in walking, not elsewhere classified     Problem List Patient Active Problem List   Diagnosis Date Noted  . OA (osteoarthritis) of knee 10/16/2016  . Status post mastectomy 01/15/2016  . Leg swelling 10/11/2015  . Neuropathy involving both lower extremities 06/19/2015  . Secondary localized osteoarthrosis, ankle and foot 06/01/2015  . Acne erythematosa 06/01/2015  . Avitaminosis D 06/01/2015  . Atrial tachycardia ([place]) 08/24/2014  . Essential hypertension 08/17/2012  . Adjustment disorder 08/17/2012  . History of breast cancer 08/17/2012   Phillips Grout PT, DPT   Huprich,Jason 04/28/2017, 1:05 PM  [place] [facility] SERVICES [address] [place], [place], [address] Phone: [phone]   Fax:  [phone]  Name: ABIGALE DOROW MRN: [id number] Date of Birth: 1926/02/20

## 2017-04-29 ENCOUNTER — Ambulatory Visit: Payer: Medicare Other | Admitting: Gastroenterology

## 2017-04-30 ENCOUNTER — Encounter: Payer: Medicare Other | Admitting: Physical Therapy

## 2017-05-02 DIAGNOSIS — M1711 Unilateral primary osteoarthritis, right knee: Secondary | ICD-10-CM | POA: Diagnosis not present

## 2017-05-07 ENCOUNTER — Ambulatory Visit: Payer: Medicare Other | Admitting: Physical Therapy

## 2017-05-07 ENCOUNTER — Encounter: Payer: Self-pay | Admitting: Physical Therapy

## 2017-05-07 DIAGNOSIS — R262 Difficulty in walking, not elsewhere classified: Secondary | ICD-10-CM

## 2017-05-07 DIAGNOSIS — M6281 Muscle weakness (generalized): Secondary | ICD-10-CM | POA: Diagnosis not present

## 2017-05-07 NOTE — Therapy (Addendum)
[place] [facility]IN [facility] SERVICES [address] [place], [place], [address] Phone: [phone]   Fax:  [phone]  Physical Therapy Treatment  Patient Details  Name: Kelli Williams MRN: [id number] Date of Birth: August 10, 1926 Referring Provider: Hessie Knows  Encounter Date: 05/07/2017      PT End of Session - 05/07/17 1105    Visit Number 7   Number of Visits 17   Date for PT Re-Evaluation 06/02/17   Authorization Type 7/10   PT Start Time 1101   PT Stop Time 1144   PT Time Calculation (min) 43 min   Equipment Utilized During Treatment Gait belt   Activity Tolerance Patient tolerated treatment well;Patient limited by fatigue   Behavior During Therapy WFL for tasks assessed/performed      Past Medical History:  Diagnosis Date  . Cancer ([place])   . Hypertension   . Neuropathic pain of both legs   . Neuropathy   . Skin cancer of forehead   . Skin cancer of nose     Past Surgical History:  Procedure Laterality Date  . ABDOMINAL HYSTERECTOMY    . BREAST LUMPECTOMY     left  . BREAST LUMPECTOMY    . CATARACT EXTRACTION     left eye  . GALLBLADDER SURGERY    . PARTIAL KNEE ARTHROPLASTY     right  . right knee replacement    . TOTAL VAGINAL HYSTERECTOMY      There were no vitals filed for this visit.      Subjective Assessment - 05/07/17 1101    Subjective She is complianing of 6/10 bilateral knee pain as well., and back pain today. No specific questions upon arrival regarding her plan of care for therapy.    Patient is accompained by: Family member   Pertinent History Patient had R TKR 7 years ago, she fractured her right foot 3 years ago and wore a boot for 4 months. She strainned her left  knee while her right foot was healing. She went to the MD and they dont want to do any surgery on the left knee. She has had 2 falls in the last 6 months , She lives at the village of [place] and her husbance died.    Limitations  Standing;Walking   How long can you sit comfortably? unlimited   How long can you stand comfortably? 30 mins   How long can you walk comfortably? 15 mins   Diagnostic tests x- ray BLE hips and knees   Patient Stated Goals Patient wants to be able to not fall.    Currently in Pain? Yes   Pain Score 5    Pain Location Knee   Pain Orientation Right;Left   Pain Descriptors / Indicators Aching   Pain Type Chronic pain   Pain Onset More than a month ago   Pain Score 8   Pain Location Back   Pain Orientation Right;Left   Pain Descriptors / Indicators Aching   Pain Type Chronic pain   Pain Onset More than a month ago   Pain Onset More than a month ago      Therapeutic Exercise:  Sit<>stand 2x10 without UE use. Pt reports fatigue at end of second set.  Ambulating in hallway with spontaneous cues for forward, back, left, right x5 minutes. Cues for upright posture and forward gaze. Pt demonstrates unsteadiness occasionally when ambulating backward.  Heel raises with BUE support on // bar. 2x10  Bil hip abd 2x10  in standing  Bl hip extension 2x10 in standing  Resisted side-steeping RTB 4 lengths x 2;; cues to not rotate trunk towards direction of mobility Standing mini squats 2 x 10 with RTB around knees to encourage abduction; cues to maintain correct erect posture Sit to stand without UE support 2 x 10; cues for technique and posture correction Step-ups to 6" step x 10 bilateral; cues for getting entire foot on step Quantum leg press 100 # x 20 x 3; cues for not snapping LE in extension and performing slowly 3 way hip with YTB and cues for trunk position and technique Matrix LE extension / abd with 7.5 lbs and cues to not rock fwd and maintain erect posture                          PT Education - 05/07/17 1104    Education provided Yes   Education Details HEP    Person(s) Educated Patient   Methods Explanation   Comprehension Verbalized understanding              PT Long Term Goals - 05/07/17 1145      PT LONG TERM GOAL #1   Title Patient will be independent in home exercise program to improve strength/mobility for better functional independence with ADLs.   Baseline no HEP   Time 8   Period Weeks   Status On-going     PT LONG TERM GOAL #2   Title Patient (> 76 years old) will complete five times sit to stand test in < 15 seconds indicating an increased LE strength and improved balance.   Baseline 25.74 sec; 24.65 sec   Time 8   Period Weeks   Status On-going     PT LONG TERM GOAL #3   Title Patient will increase six minute walk test distance to >1000 for progression to community ambulator and improve gait ability   Baseline 425 feet, 475 feet   Time 8   Period Weeks   Status On-going     PT LONG TERM GOAL #4   Title Patient will increase 10 meter walk test to >1.5m/s as to improve gait speed for better community ambulation and to reduce fall risk.   Baseline .56 m/sec; . 62 m sec   Period Weeks   Status On-going     PT LONG TERM GOAL #5   Title Patient will increase BLE gross strength to 4+/5 as to improve functional strength for independent gait, increased standing tolerance and increased ADL ability.   Baseline -3/5 strength B hips flex and aabd   Period Weeks   Status On-going               Plan - 05/07/17 1105    Clinical Impression Statement Pt presents with flexed posture and requires frequent verbal cues for upright posture and education provided on relationship between posture and back pain. She demonstrates instability with therapeutic exercises and balance exercises but tolerates all interventions well. She will benefit from continued skilled PT interventions for improved balance, posture, strength, and QOL.   Rehab Potential Good   Clinical Impairments Affecting Rehab Potential This patient presents with 2 personal factors/ comorbidities current situation and hx of falls; and 3  body elements  including body structures and functions, activity limitations and or participation restrictions: decreased strength BLE , decreased transfers sit to stand, decreased dynamic and static standing balance, decreased gait.  Patient's condition is evolving.  PT Frequency 2x / week   PT Duration 8 weeks   PT Treatment/Interventions Balance training;Therapeutic exercise;Therapeutic activities;Patient/family education   PT Next Visit Plan strengthening and balance training   PT Home Exercise Plan hip abd standing and sidelying   Consulted and Agree with Plan of Care Patient      Patient will benefit from skilled therapeutic intervention in order to improve the following deficits and impairments:  Abnormal gait, Decreased balance, Decreased endurance, Decreased mobility, Difficulty walking, Decreased activity tolerance, Decreased safety awareness, Decreased strength, Pain  Visit Diagnosis: Muscle weakness (generalized)  Difficulty in walking, not elsewhere classified     Problem List Patient Active Problem List   Diagnosis Date Noted  . OA (osteoarthritis) of knee 10/16/2016  . Status post mastectomy 01/15/2016  . Leg swelling 10/11/2015  . Neuropathy involving both lower extremities 06/19/2015  . Secondary localized osteoarthrosis, ankle and foot 06/01/2015  . Acne erythematosa 06/01/2015  . Avitaminosis D 06/01/2015  . Atrial tachycardia ([place]) 08/24/2014  . Essential hypertension 08/17/2012  . Adjustment disorder 08/17/2012  . History of breast cancer 08/17/2012  Alanson Puls, PT, DPT  Arelia Sneddon S 05/08/2017, 4:07 PM  [place] MAIN [facility] SERVICES [address] [place], [place], [address] Phone: [phone]   Fax:  [phone]  Name: Kelli Williams MRN: [id number] Date of Birth: 1926-05-11

## 2017-05-07 NOTE — Patient Instructions (Addendum)
Abduction Lift    Lie on residual limb side. Tighten muscles on outside of hip to raise sound limb ____ inches. Hold ____ seconds. Repeat ____ times. Do ____ sessions per day.  Copyright  VHI. All rights reserved.  Abduction: Clam (Eccentric) - Side-Lying    Lie on side with knees bent. Lift top knee, keeping feet together. Keep trunk steady. Slowly lower for 3-5 seconds. ___ reps per set, ___ sets per day, ___ days per week. Add ___ lbs when you achieve ___ repetitions.  http://ecce.exer.us/65   Copyright  VHI. All rights reserved.  Bridge   Lie back, legs bent. Inhale, pressing hips up. Keeping ribs in, lengthen lower back. Exhale, rolling down along spine from top. Repeat ___20_ times. Do _2___ sessions per day.  Copyright  VHI. All rights reserved.   Pelvic Tilt   Flatten back by tightening stomach muscles and buttocks. Repeat __20_ times per set. Do _2___ sets per session. Do __2__ sessions per day.  http://orth.[email or web address].us/134   Copyright  VHI. All rights reserved. Knee to Chest (Flexion)   Pull knee toward chest. Feel stretch in lower back or buttock area. Breathing deeply, Hold __30__ seconds. Repeat with other knee. Repeat 3____ times. Do _1___ sessions per day.  [email or web address]://[email or web address].[email or web address]   Copyright  VHI. All rights reserved.   Lower Trunk Rotation Stretch   Keeping back flat and feet together, rotate knees to left side. Hold _3___ seconds. Repeat __10__ times per set. Do ___2_ sets per session. Do _1___ sessions per day.  http://orth.[email or web address].us/122     Copyright  VHI. All rights reserved.  Standing Arch (Extension)   Place hands in small of back. Using hands as fulcrum, arch backward. Try to keep knees straight. Great exercise if sitting makes pain worse. Use to break up long periods of sitting. Repeat _10___ times. Do __10__ sessions per day.  http://[email or web address].[email or web address]   Copyright  VHI. All rights reserved.  Straight Leg Calf Stretch  (Gastroc)   Put palms against wall, one leg forward and bent. With other leg back straight and heel flat on floor, lean into wall. Hold _30___ seconds. Change legs and repeat. Repeat _3___ times. Do _1___ sessions per day.  http://[email or web address].us/419   Copyright  VHI. All rights reserved.   Back Wall Slide   With feet _12___ inches from wall, lean as much of back against the wall as possible. Gently squat down _12__ inches, keeping back against wall. Hold ___5_ seconds while counting out loud. Repeat __10__ times. Do __1__ sessions per day.  [email or web address]://[email or web address]   Copyright  VHI. All rights reserved.

## 2017-05-12 ENCOUNTER — Ambulatory Visit: Payer: Medicare Other | Attending: Orthopedic Surgery | Admitting: Physical Therapy

## 2017-05-12 ENCOUNTER — Encounter: Payer: Self-pay | Admitting: Physical Therapy

## 2017-05-12 DIAGNOSIS — R262 Difficulty in walking, not elsewhere classified: Secondary | ICD-10-CM | POA: Diagnosis not present

## 2017-05-12 DIAGNOSIS — M6281 Muscle weakness (generalized): Secondary | ICD-10-CM | POA: Insufficient documentation

## 2017-05-12 NOTE — Therapy (Signed)
[place] MAIN [facility] SERVICES [address] [place], [place], [address] Phone: [phone]   Fax:  [phone]  Physical Therapy Treatment  Patient Details  Name: Kelli Williams MRN: [id number] Date of Birth: [birth year] Referring Provider: Hessie Knows  Encounter Date: 05/12/2017      PT End of Session - 05/12/17 1144    Visit Number 8   Number of Visits 17   Date for PT Re-Evaluation 06/02/17   Authorization Type 8/10   PT Start Time 1135   PT Stop Time 1215   PT Time Calculation (min) 40 min   Equipment Utilized During Treatment Gait belt   Activity Tolerance Patient tolerated treatment well;Patient limited by fatigue   Behavior During Therapy WFL for tasks assessed/performed      Past Medical History:  Diagnosis Date  . Cancer ([place])   . Hypertension   . Neuropathic pain of both legs   . Neuropathy   . Skin cancer of forehead   . Skin cancer of nose     Past Surgical History:  Procedure Laterality Date  . ABDOMINAL HYSTERECTOMY    . BREAST LUMPECTOMY     left  . BREAST LUMPECTOMY    . CATARACT EXTRACTION     left eye  . GALLBLADDER SURGERY    . PARTIAL KNEE ARTHROPLASTY     right  . right knee replacement    . TOTAL VAGINAL HYSTERECTOMY      There were no vitals filed for this visit.      Subjective Assessment - 05/12/17 1142    Subjective She is complianing of 8/10 bilateral feet pain and no knee pain today. No specific questions upon arrival regarding her plan of care for therapy.    Patient is accompained by: Family member   Pertinent History Patient had R TKR 7 years ago, she fractured her right foot 3 years ago and wore a boot for 4 months. She strainned her left  knee while her right foot was healing. She went to the MD and they dont want to do any surgery on the left knee. She has had 2 falls in the last 6 months , She lives at the [place] of [place] and her husbance died.    Limitations Standing;Walking    How long can you sit comfortably? unlimited   How long can you stand comfortably? 30 mins   How long can you walk comfortably? 15 mins   Diagnostic tests x- ray BLE hips and knees   Patient Stated Goals Patient wants to be able to not fall.    Currently in Pain? Yes   Pain Score 8    Pain Location Foot   Pain Orientation Right;Left   Pain Descriptors / Indicators Aching   Pain Type Chronic pain   Pain Onset More than a month ago   Multiple Pain Sites --  back pain 8/10   Pain Onset More than a month ago   Pain Onset More than a month ago      Therapeutic Exercise:  Marching 2 x 10; cues to raise up knees higher, BLE. RTB for resistance with second set.  Sit<>stand with cues to power up to standing 2x15. Pt uses armrests Bil on first set, no UE support on second set.  Alternating step ups to 6" step x20 each LE  Lateral stepping over  foam roll x20 each direction  Standing Bil hip Extension x15 each LE  Lateral walking with cues for slight  knee bend with BUE support x10 lengths in // bars  Heel raises 2 x 10; cues to not rock forward, BLE. BUE support. Cues to raise heels as high as possible.  Toe raises 2x10 with BUE support  Quantum leg press 90  # 1x20 1x15; cues for eccentric control and to avoid Bil knee hyperextension   Cues for upright posture throughout session and education provided on relationship between posture and back pain  Neuromuscular Re-Ed:  Rhomberg stance on airex with head turns x10 to L and x10 to R  Ant/posterior rockerboard static balancing x1 minute with tendency to lose balance posteriorly with intermittent UE support  Alternating toe taps up to 8" step from airex with cues for soft toe tapping for greater control.                            PT Education - 05/12/17 1144    Education provided Yes   Education Details HEp   Person(s) Educated Patient   Methods Explanation;Verbal cues   Comprehension Verbalized  understanding;Returned demonstration;Verbal cues required             PT Long Term Goals - 05/08/17 1605      PT LONG TERM GOAL #1   Title Patient will be independent in home exercise program to improve strength/mobility for better functional independence with ADLs.   Baseline no HEP   Time 8   Period Weeks   Status On-going     PT LONG TERM GOAL #2   Title Patient (> 22 years old) will complete five times sit to stand test in < 15 seconds indicating an increased LE strength and improved balance.   Baseline 25.74 sec; 24.65 sec   Time 8   Period Weeks   Status On-going     PT LONG TERM GOAL #3   Title Patient will increase six minute walk test distance to >1000 for progression to community ambulator and improve gait ability   Baseline 425 feet, 475 feet   Time 8   Period Weeks   Status On-going     PT LONG TERM GOAL #4   Title Patient will increase 10 meter walk test to >1.61m/s as to improve gait speed for better community ambulation and to reduce fall risk.   Baseline .56 m/sec; . 62 m sec   Period Weeks   Status On-going     PT LONG TERM GOAL #5   Title Patient will increase BLE gross strength to 4+/5 as to improve functional strength for independent gait, increased standing tolerance and increased ADL ability.   Baseline -3/5 strength B hips flex and aabd   Period Weeks   Status On-going               Plan - 05/12/17 1145    Clinical Impression Statement Pt presents with flexed posture and requires frequent verbal cues for upright posture and education provided on relationship between posture and back pain. She demonstrates instability with therapeutic exercises and balance exercises but tolerates all interventions well. She will benefit from continued skilled PT interventions for improved balance, posture, strength, and QOL.   Rehab Potential Good   Clinical Impairments Affecting Rehab Potential This patient presents with 2 personal factors/ comorbidities  current situation and hx of falls; and 3  body elements including body structures and functions, activity limitations and or participation restrictions: decreased strength BLE , decreased transfers sit to stand, decreased dynamic and  static standing balance, decreased gait.  Patient's condition is evolving.   PT Frequency 2x / week   PT Duration 8 weeks   PT Treatment/Interventions Balance training;Therapeutic exercise;Therapeutic activities;Patient/family education   PT Next Visit Plan strengthening and balance training   PT Home Exercise Plan hip abd standing and sidelying   Consulted and Agree with Plan of Care Patient      Patient will benefit from skilled therapeutic intervention in order to improve the following deficits and impairments:  Abnormal gait, Decreased balance, Decreased endurance, Decreased mobility, Difficulty walking, Decreased activity tolerance, Decreased safety awareness, Decreased strength, Pain  Visit Diagnosis: Muscle weakness (generalized)  Difficulty in walking, not elsewhere classified     Problem List Patient Active Problem List   Diagnosis Date Noted  . OA (osteoarthritis) of knee 10/16/2016  . Status post mastectomy 01/15/2016  . Leg swelling 10/11/2015  . Neuropathy involving both lower extremities 06/19/2015  . Secondary localized osteoarthrosis, ankle and foot 06/01/2015  . Acne erythematosa 06/01/2015  . Avitaminosis D 06/01/2015  . Atrial tachycardia ([place]) 08/24/2014  . Essential hypertension 08/17/2012  . Adjustment disorder 08/17/2012  . History of breast cancer 08/17/2012   Alanson Puls, PT, DPT Arelia Sneddon S 05/12/2017, 11:47 AM  [place] MAIN [facility] SERVICES [address] [place], [place], [address] Phone: [phone]   Fax:  [phone]  Name: MYSTIQUE BJELLAND MRN: [id number] Date of Birth: 1926/03/09

## 2017-05-14 ENCOUNTER — Encounter: Payer: Self-pay | Admitting: Physical Therapy

## 2017-05-14 ENCOUNTER — Ambulatory Visit: Payer: Medicare Other | Admitting: Physical Therapy

## 2017-05-14 DIAGNOSIS — M6281 Muscle weakness (generalized): Secondary | ICD-10-CM

## 2017-05-14 DIAGNOSIS — R262 Difficulty in walking, not elsewhere classified: Secondary | ICD-10-CM | POA: Diagnosis not present

## 2017-05-14 NOTE — Therapy (Signed)
[place] MAIN [facility] SERVICES [address] [place], [place], [address] Phone: [phone]   Fax:  [phone]  Physical Therapy Treatment  Patient Details  Name: Kelli Williams MRN: [id number] Date of Birth: 1926-08-10 Referring Provider: Hessie Knows  Encounter Date: 05/14/2017      PT End of Session - 05/14/17 1306    Visit Number 9   Number of Visits 17   Date for PT Re-Evaluation 06/02/17   Authorization Type 9/10   PT Start Time 1145   PT Stop Time 1227   PT Time Calculation (min) 42 min   Equipment Utilized During Treatment Gait belt   Activity Tolerance Patient tolerated treatment well;Patient limited by fatigue   Behavior During Therapy WFL for tasks assessed/performed      Past Medical History:  Diagnosis Date  . Cancer ([place])   . Hypertension   . Neuropathic pain of both legs   . Neuropathy   . Skin cancer of forehead   . Skin cancer of nose     Past Surgical History:  Procedure Laterality Date  . ABDOMINAL HYSTERECTOMY    . BREAST LUMPECTOMY     left  . BREAST LUMPECTOMY    . CATARACT EXTRACTION     left eye  . GALLBLADDER SURGERY    . PARTIAL KNEE ARTHROPLASTY     right  . right knee replacement    . TOTAL VAGINAL HYSTERECTOMY      There were no vitals filed for this visit.      Subjective Assessment - 05/14/17 1303    Subjective She is complianing of 8/10 bilateral feet pain and no knee pain today. No specific questions upon arrival regarding her plan of care for therapy.    Patient is accompained by: Family member   Pertinent History Patient had R TKR 7 years ago, she fractured her right foot 3 years ago and wore a boot for 4 months. She strainned her left  knee while her right foot was healing. She went to the MD and they dont want to do any surgery on the left knee. She has had 2 falls in the last 6 months , She lives at the [place] of [place] and her husbance died.    Limitations Standing;Walking    How long can you sit comfortably? unlimited   How long can you stand comfortably? 30 mins   How long can you walk comfortably? 15 mins   Diagnostic tests x- ray BLE hips and knees   Patient Stated Goals Patient wants to be able to not fall.    Currently in Pain? Yes   Pain Score 8    Pain Location Foot   Pain Orientation Right;Left   Pain Descriptors / Indicators Aching   Pain Type Chronic pain   Pain Onset More than a month ago   Pain Frequency Constant   Aggravating Factors  standing   Pain Relieving Factors medicine   Effect of Pain on Daily Activities unable to walk   Multiple Pain Sites Yes   Pain Score 5   Pain Location Knee   Pain Orientation Right;Left   Pain Descriptors / Indicators Aching   Pain Type Chronic pain   Pain Onset More than a month ago   Pain Frequency Constant   Aggravating Factors  walking   Pain Relieving Factors nothing   Effect of Pain on Daily Activities difficult to walk   Pain Score 5   Pain Location Back  Pain Orientation Right   Pain Descriptors / Indicators Aching   Pain Type Chronic pain   Pain Onset More than a month ago   Pain Frequency Constant       Therapeutic Exercise:   Nu-step level 3 x 5 mins, spm 60   Supine hip Abd/ER with GTB around knees, 3x10. Cues to control band during eccentric phase.   hooklying marching x 20 with cues to slow the exercise   SAQ with 5 sec hold x 20 BLE  sidelying clam x 20 BLE   sidelying hip abd x 10 BLE  Standing hip abd x 20 x 1 BLE  Standing hip extension x 20 BLE  Step ups to 6 inch stool x 20  Step downs to floor from 3 inch stool x 10 BLE  Side stepping with GTB x 5 lengths of parallel bars  Cues for correct technique and repetitions with all above exercises. No reports of increased pain in feet, knees or back                            PT Education - 05/14/17 1305    Education provided Yes   Education Details standing hip abd exercises and  sidelying hip abd exericses   Person(s) Educated Patient   Methods Explanation;Demonstration   Comprehension Verbalized understanding;Returned demonstration;Verbal cues required             PT Long Term Goals - 05/08/17 1605      PT LONG TERM GOAL #1   Title Patient will be independent in home exercise program to improve strength/mobility for better functional independence with ADLs.   Baseline no HEP   Time 8   Period Weeks   Status On-going     PT LONG TERM GOAL #2   Title Patient (> 81 years old) will complete five times sit to stand test in < 15 seconds indicating an increased LE strength and improved balance.   Baseline 25.74 sec; 24.65 sec   Time 8   Period Weeks   Status On-going     PT LONG TERM GOAL #3   Title Patient will increase six minute walk test distance to >1000 for progression to community ambulator and improve gait ability   Baseline 425 feet, 475 feet   Time 8   Period Weeks   Status On-going     PT LONG TERM GOAL #4   Title Patient will increase 10 meter walk test to >1.26m/s as to improve gait speed for better community ambulation and to reduce fall risk.   Baseline .56 m/sec; . 62 m sec   Period Weeks   Status On-going     PT LONG TERM GOAL #5   Title Patient will increase BLE gross strength to 4+/5 as to improve functional strength for independent gait, increased standing tolerance and increased ADL ability.   Baseline -3/5 strength B hips flex and aabd   Period Weeks   Status On-going               Plan - 05/14/17 1307    Clinical Impression Statement Patient required min verbal cueing during standing and supine exercises and required CGA during all dynamic standing balance activities. Patient required occasional rest breaks between exercises due to fatigue. Patient tolerated exercise well with minimal increase in knee pain during standing exercises. Patient will continue to benefit from skilled therapy in order to improve her dynamic  standing balance activities  and increase gait speed to reduce her risk for falls   Rehab Potential Good   Clinical Impairments Affecting Rehab Potential This patient presents with 2 personal factors/ comorbidities current situation and hx of falls; and 3  body elements including body structures and functions, activity limitations and or participation restrictions: decreased strength BLE , decreased transfers sit to stand, decreased dynamic and static standing balance, decreased gait.  Patient's condition is evolving.   PT Frequency 2x / week   PT Duration 8 weeks   PT Treatment/Interventions Balance training;Therapeutic exercise;Therapeutic activities;Patient/family education   PT Next Visit Plan strengthening and balance training   PT Home Exercise Plan hip abd standing and sidelying   Consulted and Agree with Plan of Care Patient      Patient will benefit from skilled therapeutic intervention in order to improve the following deficits and impairments:  Abnormal gait, Decreased balance, Decreased endurance, Decreased mobility, Difficulty walking, Decreased activity tolerance, Decreased safety awareness, Decreased strength, Pain  Visit Diagnosis: Muscle weakness (generalized)  Difficulty in walking, not elsewhere classified     Problem List Patient Active Problem List   Diagnosis Date Noted  . OA (osteoarthritis) of knee 10/16/2016  . Status post mastectomy 01/15/2016  . Leg swelling 10/11/2015  . Neuropathy involving both lower extremities 06/19/2015  . Secondary localized osteoarthrosis, ankle and foot 06/01/2015  . Acne erythematosa 06/01/2015  . Avitaminosis D 06/01/2015  . Atrial tachycardia ([place]) 08/24/2014  . Essential hypertension 08/17/2012  . Adjustment disorder 08/17/2012  . History of breast cancer 08/17/2012   Alanson Puls, PT, DPT Arelia Sneddon S 05/14/2017, 1:09 PM  [place] MAIN [facility] SERVICES [address]  [place], [place], [address] Phone: [phone]   Fax:  [phone]  Name: SEQUITA WISE MRN: [id number] Date of Birth: December 26, 1925

## 2017-05-15 ENCOUNTER — Ambulatory Visit: Payer: Medicare Other | Admitting: Physical Therapy

## 2017-05-19 ENCOUNTER — Encounter: Payer: Self-pay | Admitting: Physical Therapy

## 2017-05-19 ENCOUNTER — Ambulatory Visit: Payer: Medicare Other | Admitting: Physical Therapy

## 2017-05-19 DIAGNOSIS — R262 Difficulty in walking, not elsewhere classified: Secondary | ICD-10-CM | POA: Diagnosis not present

## 2017-05-19 DIAGNOSIS — M6281 Muscle weakness (generalized): Secondary | ICD-10-CM

## 2017-05-19 NOTE — Therapy (Signed)
[place] MAIN [facility] SERVICES [address] [place], [place], [address] Phone: [phone]   Fax:  [phone]  Physical Therapy Treatment  Patient Details  Name: Kelli Williams MRN: [id number] Date of Birth: 24-Feb-1926 Referring Provider: Hessie Knows  Encounter Date: 05/19/2017      PT End of Session - 05/19/17 1147    Visit Number 10   Number of Visits 17   Date for PT Re-Evaluation 06/02/17   Authorization Type 10/10   PT Start Time 1135   PT Stop Time 1215   PT Time Calculation (min) 40 min   Equipment Utilized During Treatment Gait belt   Activity Tolerance Patient tolerated treatment well;Patient limited by fatigue   Behavior During Therapy WFL for tasks assessed/performed      Past Medical History:  Diagnosis Date  . Cancer ([place])   . Hypertension   . Neuropathic pain of both legs   . Neuropathy   . Skin cancer of forehead   . Skin cancer of nose     Past Surgical History:  Procedure Laterality Date  . ABDOMINAL HYSTERECTOMY    . BREAST LUMPECTOMY     left  . BREAST LUMPECTOMY    . CATARACT EXTRACTION     left eye  . GALLBLADDER SURGERY    . PARTIAL KNEE ARTHROPLASTY     right  . right knee replacement    . TOTAL VAGINAL HYSTERECTOMY      There were no vitals filed for this visit.      Subjective Assessment - 05/19/17 1144    Subjective She is complianing of 9/10 back pain.  No specific questions upon arrival regarding her plan of care for therapy.    Patient is accompained by: Family member   Pertinent History Patient had R TKR 7 years ago, she fractured her right foot 3 years ago and wore a boot for 4 months. She strainned her left  knee while her right foot was healing. She went to the MD and they dont want to do any surgery on the left knee. She has had 2 falls in the last 6 months , She lives at the [place] and her husbance died.    Limitations Standing;Walking   How long can you sit  comfortably? unlimited   How long can you stand comfortably? 30 mins   How long can you walk comfortably? 15 mins   Diagnostic tests x- ray BLE hips and knees   Patient Stated Goals Patient wants to be able to not fall.    Currently in Pain? Yes   Pain Score 9    Pain Location Back   Pain Orientation Right;Left;Lower   Pain Descriptors / Indicators Aching   Pain Type Chronic pain   Pain Onset More than a month ago   Pain Frequency Constant   Aggravating Factors  standing   Pain Relieving Factors medicine   Effect of Pain on Daily Activities difficulty standing    Multiple Pain Sites Yes   Pain Score 8   Pain Location Foot   Pain Orientation Right;Left   Pain Descriptors / Indicators Aching   Pain Onset More than a month ago   Pain Frequency Constant   Aggravating Factors  walking   Pain Relieving Factors nothing   Effect of Pain on Daily Activities difficulty to walk   Pain Score 5   Pain Location Knee   Pain Orientation Right   Pain Descriptors / Indicators Aching  Pain Onset More than a month ago   Pain Frequency Constant   Aggravating Factors  walking   Pain Relieving Factors medicine   Effect of Pain on Daily Activities difficulty walking      THER-EX Standing exercises with 2# ankle weights: Marching 2 x 10; SLR 2 x 10; Abduction 2 x 10; Extension 2 x 10; Knee flexion 2 x 10; Heel raises 2 x 10; Resisted side-steeping RTB 4 lengths x 2; Standing mini squats 2 x 10 with RTB around knees to encourage abduction; Sit to stand without UE support 2 x 10; Step-ups to 6" step x 10 bilateral; Quantum leg press 100# x 20,  Patient required consistent cueing to perform exercises slowly in order to achieve proper form and desired outcome of the exercise                           PT Education - 05/19/17 1146    Education provided Yes   Education Details heat for back pain and ice for back pain   Person(s) Educated Patient   Methods Explanation    Comprehension Verbalized understanding             PT Long Term Goals - 05/19/17 1147      PT LONG TERM GOAL #1   Title Patient will be independent in home exercise program to improve strength/mobility for better functional independence with ADLs.   Baseline HEP   Time 8   Period Weeks   Status On-going     PT LONG TERM GOAL #2   Title Patient (> 57 years old) will complete five times sit to stand test in < 15 seconds indicating an increased LE strength and improved balance.   Baseline 25.74 sec; 24.65 sec   Time 8   Period Weeks   Status On-going     PT LONG TERM GOAL #3   Title Patient will increase six minute walk test distance to >1000 for progression to community ambulator and improve gait ability   Baseline 425 feet, 475 feet   Time 8   Period Weeks   Status On-going     PT LONG TERM GOAL #4   Title Patient will increase 10 meter walk test to >1.30m/s as to improve gait speed for better community ambulation and to reduce fall risk.   Baseline .56 m/sec; . 62 m sec   Time 8   Period Weeks   Status On-going     PT LONG TERM GOAL #5   Title Patient will increase BLE gross strength to 4+/5 as to improve functional strength for independent gait, increased standing tolerance and increased ADL ability.   Baseline -3/5 strength B hips flex and aabd   Time 8   Period Weeks   Status On-going               Plan - 05/19/17 1154    Clinical Impression Statement Patient required min verbal cueing during 3 way hip exercise to correct posture and form. Patient demonstrates ability to perform strengthening exercises and dynamic standing balance exercises with no increase in pain but with moderate fatigue. Patient will continue to benefit from continued skilled therapy in order to improve dynamic standing balance and increase strength in order to decrease falls   Rehab Potential Good   Clinical Impairments Affecting Rehab Potential This patient presents with 2 personal  factors/ comorbidities current situation and hx of falls; and 3  body elements  including body structures and functions, activity limitations and or participation restrictions: decreased strength BLE , decreased transfers sit to stand, decreased dynamic and static standing balance, decreased gait.  Patient's condition is evolving.   PT Frequency 2x / week   PT Duration 8 weeks   PT Treatment/Interventions Balance training;Therapeutic exercise;Therapeutic activities;Patient/family education   PT Next Visit Plan strengthening and balance training   PT Home Exercise Plan hip abd standing and sidelying   Consulted and Agree with Plan of Care Patient      Patient will benefit from skilled therapeutic intervention in order to improve the following deficits and impairments:  Abnormal gait, Decreased balance, Decreased endurance, Decreased mobility, Difficulty walking, Decreased activity tolerance, Decreased safety awareness, Decreased strength, Pain  Visit Diagnosis: Muscle weakness (generalized)  Difficulty in walking, not elsewhere classified       G-Codes - 06-16-17 1154    Functional Assessment Tool Used (Outpatient Only) 5 x sit to stand, TUG, 10 MW, 6 MW   Functional Limitation Mobility: Walking and moving around   Mobility: Walking and Moving Around Current Status [phone]) At least 40 percent but less than 60 percent impaired, limited or restricted   Mobility: Walking and Moving Around Goal Status [phone]) At least 20 percent but less than 40 percent impaired, limited or restricted      Problem List Patient Active Problem List   Diagnosis Date Noted  . OA (osteoarthritis) of knee 10/16/2016  . Status post mastectomy 01/15/2016  . Leg swelling 10/11/2015  . Neuropathy involving both lower extremities 06/19/2015  . Secondary localized osteoarthrosis, ankle and foot 06/01/2015  . Acne erythematosa 06/01/2015  . Avitaminosis D 06/01/2015  . Atrial tachycardia ([place]) 08/24/2014  .  Essential hypertension 08/17/2012  . Adjustment disorder 08/17/2012  . History of breast cancer 08/17/2012  Alanson Puls, PT, DPT  Arelia Sneddon S 06-16-17, 11:55 AM  [place] MAIN [facility] SERVICES [place], [place], [address] Phone: [phone]   Fax:  [phone]  Name: TEKOA HAMOR MRN: [id number] Date of Birth: 08/03/1926

## 2017-05-21 ENCOUNTER — Ambulatory Visit: Payer: Medicare Other | Admitting: Physical Therapy

## 2017-05-21 ENCOUNTER — Encounter: Payer: Self-pay | Admitting: Physical Therapy

## 2017-05-21 DIAGNOSIS — M6281 Muscle weakness (generalized): Secondary | ICD-10-CM

## 2017-05-21 DIAGNOSIS — R262 Difficulty in walking, not elsewhere classified: Secondary | ICD-10-CM

## 2017-05-21 NOTE — Therapy (Signed)
[place] MAIN [facility] SERVICES [address] [place], [place], [address] Phone: [phone]   Fax:  [phone]  Physical Therapy Treatment  Patient Details  Name: Kelli Williams MRN: [id number] Date of Birth: 12-31-1925 Referring Provider: Hessie Knows  Encounter Date: 05/21/2017      PT End of Session - 05/21/17 1158    Visit Number 11   Number of Visits 17   Date for PT Re-Evaluation 06/02/17   Authorization Type 1/10   PT Start Time 1135   PT Stop Time 1215   PT Time Calculation (min) 40 min   Equipment Utilized During Treatment Gait belt   Activity Tolerance Patient tolerated treatment well;Patient limited by fatigue   Behavior During Therapy WFL for tasks assessed/performed      Past Medical History:  Diagnosis Date  . Cancer ([place])   . Hypertension   . Neuropathic pain of both legs   . Neuropathy   . Skin cancer of forehead   . Skin cancer of nose     Past Surgical History:  Procedure Laterality Date  . ABDOMINAL HYSTERECTOMY    . BREAST LUMPECTOMY     left  . BREAST LUMPECTOMY    . CATARACT EXTRACTION     left eye  . GALLBLADDER SURGERY    . PARTIAL KNEE ARTHROPLASTY     right  . right knee replacement    . TOTAL VAGINAL HYSTERECTOMY      There were no vitals filed for this visit.      Subjective Assessment - 05/21/17 1155    Subjective She is complianing of 9/10 back pain.  No specific questions upon arrival regarding her plan of care for therapy.    Patient is accompained by: Family member   Pertinent History Patient had R TKR 7 years ago, she fractured her right foot 3 years ago and wore a boot for 4 months. She strainned her left  knee while her right foot was healing. She went to the MD and they dont want to do any surgery on the left knee. She has had 2 falls in the last 6 months , She lives at the [place] and her husbance died.    Limitations Standing;Walking   How long can you sit  comfortably? unlimited   How long can you stand comfortably? 30 mins   How long can you walk comfortably? 15 mins   Diagnostic tests x- ray BLE hips and knees   Patient Stated Goals Patient wants to be able to not fall.    Currently in Pain? Yes   Pain Score 9    Pain Location Back   Pain Orientation Right;Left;Lower   Pain Descriptors / Indicators Aching   Pain Type Chronic pain   Pain Onset More than a month ago   Multiple Pain Sites Yes   Pain Score 0   Pain Location Foot   Pain Orientation Right;Left   Pain Onset More than a month ago   Pain Score 8   Pain Location Knee   Pain Orientation Right;Left   Pain Descriptors / Indicators Aching   Pain Type Chronic pain   Pain Onset More than a month ago   Pain Frequency Constant   Aggravating Factors  walking   Pain Relieving Factors medicine   Effect of Pain on Daily Activities difficulty walking      Therapeutic Exercise:   Nu-step level 3 x 5 mins, spm 60   Supine hip Abd/ER  with GTB around knees, 3x10. Cues to control band during eccentric phase.   hooklying marching x 20 with cues to slow the exercise   SAQ with 5 sec hold x 20 BLE  sidelying clam x 20 BLE   sidelying hip abd x 10 BLE  Standing step ups x 20 BLE  Standing eccentric step downs x 20 from 2 inch step   Heel raises standing on half foam with flat side down 10x3,cues for technique and posture correction  Quantum double leg press 100 lbs x 20 x 2  Cues for correct technique and repetitions. CGA and Min to mod verbal cues used throughout with increased in postural sway and LOB most seen with narrow base of support and while on uneven surfaces. Continues to have balance deficits typical with diagnosis. Patient performs intermediate level exercises without pain behaviors and needs verbal cuing for postural alignment and head positioning                            PT Education - 05/21/17 1157    Education provided Yes    Education Details heat for knees   Person(s) Educated Patient   Methods Explanation;Demonstration;Verbal cues   Comprehension Verbalized understanding;Returned demonstration             PT Long Term Goals - 05/19/17 1147      PT LONG TERM GOAL #1   Title Patient will be independent in home exercise program to improve strength/mobility for better functional independence with ADLs.   Baseline HEP   Time 8   Period Weeks   Status On-going     PT LONG TERM GOAL #2   Title Patient (> 31 years old) will complete five times sit to stand test in < 15 seconds indicating an increased LE strength and improved balance.   Baseline 25.74 sec; 24.65 sec   Time 8   Period Weeks   Status On-going     PT LONG TERM GOAL #3   Title Patient will increase six minute walk test distance to >1000 for progression to community ambulator and improve gait ability   Baseline 425 feet, 475 feet   Time 8   Period Weeks   Status On-going     PT LONG TERM GOAL #4   Title Patient will increase 10 meter walk test to >1.82m/s as to improve gait speed for better community ambulation and to reduce fall risk.   Baseline .56 m/sec; . 62 m sec   Time 8   Period Weeks   Status On-going     PT LONG TERM GOAL #5   Title Patient will increase BLE gross strength to 4+/5 as to improve functional strength for independent gait, increased standing tolerance and increased ADL ability.   Baseline -3/5 strength B hips flex and aabd   Time 8   Period Weeks   Status On-going               Plan - 05/21/17 1159    Clinical Impression Statement Pt continues to demonstrate decreased bilateral hip strength as noted by Trendelenberg when in single leg stance. She has weakness in BLE hips and knees and pain. She has difficulty with posterior LOB during sit to stand training. Gait speed is decreased and needs rollator.  Discussed the importance of HEP in order to see improvement in her strength/balance. Pt encouraged to  follow-up as scheduled.    Rehab Potential Good  Clinical Impairments Affecting Rehab Potential This patient presents with 2 personal factors/ comorbidities current situation and hx of falls; and 3  body elements including body structures and functions, activity limitations and or participation restrictions: decreased strength BLE , decreased transfers sit to stand, decreased dynamic and static standing balance, decreased gait.  Patient's condition is evolving.   PT Frequency 2x / week   PT Duration 8 weeks   PT Treatment/Interventions Balance training;Therapeutic exercise;Therapeutic activities;Patient/family education   PT Next Visit Plan strengthening and balance training   PT Home Exercise Plan hip abd standing and sidelying   Consulted and Agree with Plan of Care Patient      Patient will benefit from skilled therapeutic intervention in order to improve the following deficits and impairments:  Abnormal gait, Decreased balance, Decreased endurance, Decreased mobility, Difficulty walking, Decreased activity tolerance, Decreased safety awareness, Decreased strength, Pain  Visit Diagnosis: Muscle weakness (generalized)  Difficulty in walking, not elsewhere classified     Problem List Patient Active Problem List   Diagnosis Date Noted  . OA (osteoarthritis) of knee 10/16/2016  . Status post mastectomy 01/15/2016  . Leg swelling 10/11/2015  . Neuropathy involving both lower extremities 06/19/2015  . Secondary localized osteoarthrosis, ankle and foot 06/01/2015  . Acne erythematosa 06/01/2015  . Avitaminosis D 06/01/2015  . Atrial tachycardia ([place]) 08/24/2014  . Essential hypertension 08/17/2012  . Adjustment disorder 08/17/2012  . History of breast cancer 08/17/2012   Alanson Puls, PT, DPT Arelia Sneddon S 05/21/2017, 12:01 PM  [place] MAIN [facility] SERVICES [address] [place], [place], [address] Phone: [phone]   Fax:   [phone]  Name: Kelli Williams MRN: [id number] Date of Birth: Apr 25, 1926

## 2017-05-26 ENCOUNTER — Encounter: Payer: Self-pay | Admitting: Physical Therapy

## 2017-05-26 ENCOUNTER — Ambulatory Visit: Payer: Medicare Other | Admitting: Physical Therapy

## 2017-05-26 DIAGNOSIS — R262 Difficulty in walking, not elsewhere classified: Secondary | ICD-10-CM | POA: Diagnosis not present

## 2017-05-26 DIAGNOSIS — M6281 Muscle weakness (generalized): Secondary | ICD-10-CM

## 2017-05-26 NOTE — Therapy (Signed)
[place] MAIN [facility] SERVICES [address] [place], [place], [address] Phone: [phone]   Fax:  [phone]  Physical Therapy Treatment  Patient Details  Name: Kelli Williams MRN: [id number] Date of Birth: 07-03-1926 Referring Provider: Hessie Knows  Encounter Date: 05/26/2017      PT End of Session - 05/26/17 1109    Visit Number 12   Number of Visits 17   Date for PT Re-Evaluation 06/02/17   Authorization Type 2/10   PT Start Time 1100   PT Stop Time 1145   PT Time Calculation (min) 45 min   Equipment Utilized During Treatment Gait belt   Activity Tolerance Patient tolerated treatment well;Patient limited by fatigue   Behavior During Therapy WFL for tasks assessed/performed      Past Medical History:  Diagnosis Date  . Cancer ([place])   . Hypertension   . Neuropathic pain of both legs   . Neuropathy   . Skin cancer of forehead   . Skin cancer of nose     Past Surgical History:  Procedure Laterality Date  . ABDOMINAL HYSTERECTOMY    . BREAST LUMPECTOMY     left  . BREAST LUMPECTOMY    . CATARACT EXTRACTION     left eye  . GALLBLADDER SURGERY    . PARTIAL KNEE ARTHROPLASTY     right  . right knee replacement    . TOTAL VAGINAL HYSTERECTOMY      There were no vitals filed for this visit.      Subjective Assessment - 05/26/17 1106    Subjective She is complianing of 8/10 back pain.  No specific questions upon arrival regarding her plan of care for therapy.    Patient is accompained by: Family member   Pertinent History Patient had R TKR 7 years ago, she fractured her right foot 3 years ago and wore a boot for 4 months. She strainned her left  knee while her right foot was healing. She went to the MD and they dont want to do any surgery on the left knee. She has had 2 falls in the last 6 months , She lives at the [place] and her husbance died.    Limitations Standing;Walking   How long can you sit  comfortably? unlimited   How long can you stand comfortably? 30 mins   How long can you walk comfortably? 15 mins   Diagnostic tests x- ray BLE hips and knees   Patient Stated Goals Patient wants to be able to not fall.    Currently in Pain? Yes   Pain Score 8    Pain Location Back   Pain Orientation Right;Left;Lower   Pain Descriptors / Indicators Aching   Pain Onset More than a month ago   Pain Frequency Constant   Aggravating Factors  standing   Pain Relieving Factors medicine   Effect of Pain on Daily Activities difficulty walking   Multiple Pain Sites Yes   Pain Score 7   Pain Location Foot   Pain Orientation Right;Left   Pain Descriptors / Indicators Aching   Pain Type Chronic pain   Pain Onset More than a month ago   Pain Frequency Constant   Aggravating Factors  walking   Pain Relieving Factors nothing   Effect of Pain on Daily Activities difficulty with walking   Pain Score 7   Pain Location Knee   Pain Orientation Right;Left   Pain Descriptors / Indicators Aching  Pain Onset More than a month ago   Pain Frequency Constant   Aggravating Factors  walking   Pain Relieving Factors medicine   Effect of Pain on Daily Activities difficulty walking      Therapeutic Exercise:  Sit<>stand 2x10 without UE use. Pt reports fatigue at end of second set.  Ambulating in hallway with spontaneous cues for forward, back, left, right x5 minutes. Cues for upright posture and forward gaze. Pt demonstrates unsteadiness occasionally when ambulating backward.  Heel raises with BUE support on // bar. 2x10  Bil hip abd 2x10 in standing  Bl hip extension 2x10 in standing  Resisted side-steeping RTB 4 lengths x 2;; cues to not rotate trunk towards direction of mobility Standing mini squats 2 x 10 with RTB around knees to encourage abduction; cues to maintain correct erect posture Sit to stand without UE support 2 x 10; cues for technique and posture correction Step-ups to 6" step x 10  bilateral; cues for getting entire foot on step Quantum leg press 100 # x 20 x 3; cues for not snapping LE in extension and performing slowly 3 way hip with YTB and cues for trunk position and technique Matrix LE extension / abd with 7.5 lbs and cues to not rock fwd and maintain erect posture                            PT Education - 05/26/17 1109    Education Details HEP   Person(s) Educated Patient   Methods Explanation   Comprehension Verbalized understanding             PT Long Term Goals - 05/19/17 1147      PT LONG TERM GOAL #1   Title Patient will be independent in home exercise program to improve strength/mobility for better functional independence with ADLs.   Baseline HEP   Time 8   Period Weeks   Status On-going     PT LONG TERM GOAL #2   Title Patient (> 30 years old) will complete five times sit to stand test in < 15 seconds indicating an increased LE strength and improved balance.   Baseline 25.74 sec; 24.65 sec   Time 8   Period Weeks   Status On-going     PT LONG TERM GOAL #3   Title Patient will increase six minute walk test distance to >1000 for progression to community ambulator and improve gait ability   Baseline 425 feet, 475 feet   Time 8   Period Weeks   Status On-going     PT LONG TERM GOAL #4   Title Patient will increase 10 meter walk test to >1.29m/s as to improve gait speed for better community ambulation and to reduce fall risk.   Baseline .56 m/sec; . 62 m sec   Time 8   Period Weeks   Status On-going     PT LONG TERM GOAL #5   Title Patient will increase BLE gross strength to 4+/5 as to improve functional strength for independent gait, increased standing tolerance and increased ADL ability.   Baseline -3/5 strength B hips flex and aabd   Time 8   Period Weeks   Status On-going               Plan - 05/26/17 1110    Clinical Impression Statement Patient required min verbal cueing during standing and  supine exercises and required CGA during all dynamic standing  balance activities. Patient required occasional rest breaks between exercises due to fatigue. Patient tolerated exercise well with minimal increase in knee pain during standing exercises. Patient will continue to benefit from skilled therapy in order to improve her dynamic standing balance activities and increase gait speed to reduce her risk for falls   Rehab Potential Good   Clinical Impairments Affecting Rehab Potential This patient presents with 2 personal factors/ comorbidities current situation and hx of falls; and 3  body elements including body structures and functions, activity limitations and or participation restrictions: decreased strength BLE , decreased transfers sit to stand, decreased dynamic and static standing balance, decreased gait.  Patient's condition is evolving.   PT Frequency 2x / week   PT Duration 8 weeks   PT Treatment/Interventions Balance training;Therapeutic exercise;Therapeutic activities;Patient/family education   PT Next Visit Plan strengthening and balance training   PT Home Exercise Plan hip abd standing and sidelying   Consulted and Agree with Plan of Care Patient      Patient will benefit from skilled therapeutic intervention in order to improve the following deficits and impairments:  Abnormal gait, Decreased balance, Decreased endurance, Decreased mobility, Difficulty walking, Decreased activity tolerance, Decreased safety awareness, Decreased strength, Pain  Visit Diagnosis: Muscle weakness (generalized)  Difficulty in walking, not elsewhere classified     Problem List Patient Active Problem List   Diagnosis Date Noted  . OA (osteoarthritis) of knee 10/16/2016  . Status post mastectomy 01/15/2016  . Leg swelling 10/11/2015  . Neuropathy involving both lower extremities 06/19/2015  . Secondary localized osteoarthrosis, ankle and foot 06/01/2015  . Acne erythematosa 06/01/2015  .  Avitaminosis D 06/01/2015  . Atrial tachycardia ([place]) 08/24/2014  . Essential hypertension 08/17/2012  . Adjustment disorder 08/17/2012  . History of breast cancer 08/17/2012   Alanson Puls, PT, DPT Arelia Sneddon S 05/26/2017, 11:12 AM  [place] MAIN [facility] SERVICES [address] [place], [place], [address] Phone: [phone]   Fax:  [phone]  Name: Kelli Williams MRN: [id number] Date of Birth: 03-27-1926

## 2017-05-28 ENCOUNTER — Ambulatory Visit: Payer: Medicare Other | Admitting: Physical Therapy

## 2017-05-28 ENCOUNTER — Encounter: Payer: Self-pay | Admitting: Physical Therapy

## 2017-05-28 DIAGNOSIS — M1712 Unilateral primary osteoarthritis, left knee: Secondary | ICD-10-CM | POA: Diagnosis not present

## 2017-05-28 DIAGNOSIS — M6281 Muscle weakness (generalized): Secondary | ICD-10-CM | POA: Diagnosis not present

## 2017-05-28 DIAGNOSIS — R262 Difficulty in walking, not elsewhere classified: Secondary | ICD-10-CM | POA: Diagnosis not present

## 2017-05-28 NOTE — Therapy (Signed)
[place] MAIN [facility] SERVICES [address] [place], [place], [address] Phone: [phone]   Fax:  [phone]  Physical Therapy Treatment  Patient Details  Name: Kelli Williams MRN: [id number] Date of Birth: September 30, 1926 Referring Provider: Hessie Knows  Encounter Date: 05/28/2017      PT End of Session - 05/28/17 1149    Visit Number 13   Number of Visits 17   Date for PT Re-Evaluation 06/02/17   Authorization Type 3/10   PT Start Time 1130   PT Stop Time 1210   PT Time Calculation (min) 40 min   Equipment Utilized During Treatment Gait belt   Activity Tolerance Patient tolerated treatment well;Patient limited by fatigue   Behavior During Therapy WFL for tasks assessed/performed      Past Medical History:  Diagnosis Date  . Cancer ([place])   . Hypertension   . Neuropathic pain of both legs   . Neuropathy   . Skin cancer of forehead   . Skin cancer of nose     Past Surgical History:  Procedure Laterality Date  . ABDOMINAL HYSTERECTOMY    . BREAST LUMPECTOMY     left  . BREAST LUMPECTOMY    . CATARACT EXTRACTION     left eye  . GALLBLADDER SURGERY    . PARTIAL KNEE ARTHROPLASTY     right  . right knee replacement    . TOTAL VAGINAL HYSTERECTOMY      There were no vitals filed for this visit.      Subjective Assessment - 05/28/17 1147    Subjective She is complianing of knee, feet, back pain.  No specific questions upon arrival regarding her plan of care for therapy.    Patient is accompained by: Family member   Pertinent History Patient had R TKR 7 years ago, she fractured her right foot 3 years ago and wore a boot for 4 months. She strainned her left  knee while her right foot was healing. She went to the MD and they dont want to do any surgery on the left knee. She has had 2 falls in the last 6 months , She lives at the [place] and her husbance died.    Limitations Standing;Walking   How long can you sit  comfortably? unlimited   How long can you stand comfortably? 30 mins   How long can you walk comfortably? 15 mins   Diagnostic tests x- ray BLE hips and knees   Patient Stated Goals Patient wants to be able to not fall.    Currently in Pain? Yes   Pain Location Back   Pain Orientation Left;Right;Lower   Pain Descriptors / Indicators Aching   Pain Type Chronic pain   Pain Onset More than a month ago   Multiple Pain Sites Yes   Pain Score 9   Pain Location Foot   Pain Orientation Right;Left   Pain Descriptors / Indicators Aching   Pain Type Chronic pain   Pain Onset More than a month ago   Pain Score 5   Pain Location Knee   Pain Orientation Right;Left   Pain Descriptors / Indicators Aching   Pain Onset More than a month ago        THER-EX Standing exercises with GTB: Marching 2 x 10; SLR 2 x 10; Abduction 2 x 10; Extension 2 x 10; Heel raises 2 x 10; Squat x 10 x 2 cues for posture Resisted side-steeping GTB 4 lengths x  2; Standing mini squats 2 x 10 with RTB around knees to encourage abduction; Sit to stand without UE support 2 x 10; Step-ups to 6" step x 10 bilateral; Quantum leg press 100# 10;x 3 Step ups with 6 inch x 20  Eccentric step downs x 20  With 6 inch stool and UE support  Patient demonstrating more control with  squat exercise.Patient needs verbal cueing to improve posture and cueing to correctly perform exercises slowly, holding at end of range to increase motor firing of desired muscle to encourage fatigue.                           PT Education - 05/28/17 1149    Education provided Yes   Education Details HEP   Person(s) Educated Patient   Methods Explanation   Comprehension Verbalized understanding             PT Long Term Goals - 05/19/17 1147      PT LONG TERM GOAL #1   Title Patient will be independent in home exercise program to improve strength/mobility for better functional independence with ADLs.   Baseline  HEP   Time 8   Period Weeks   Status On-going     PT LONG TERM GOAL #2   Title Patient (> 72 years old) will complete five times sit to stand test in < 15 seconds indicating an increased LE strength and improved balance.   Baseline 25.74 sec; 24.65 sec   Time 8   Period Weeks   Status On-going     PT LONG TERM GOAL #3   Title Patient will increase six minute walk test distance to >1000 for progression to community ambulator and improve gait ability   Baseline 425 feet, 475 feet   Time 8   Period Weeks   Status On-going     PT LONG TERM GOAL #4   Title Patient will increase 10 meter walk test to >1.92m/s as to improve gait speed for better community ambulation and to reduce fall risk.   Baseline .56 m/sec; . 62 m sec   Time 8   Period Weeks   Status On-going     PT LONG TERM GOAL #5   Title Patient will increase BLE gross strength to 4+/5 as to improve functional strength for independent gait, increased standing tolerance and increased ADL ability.   Baseline -3/5 strength B hips flex and aabd   Time 8   Period Weeks   Status On-going               Plan - 05/28/17 1150    Clinical Impression Statement Patient required min verbal cueing during 3 way hip exercise to correct posture and form. Patient demonstrates ability to perform strengthening exercises and dynamic standing balance exercises with no increase in pain but with moderate fatigue. Patient will continue to benefit from continued skilled therapy in order to improve dynamic standing balance and increase strength in order to decrease falls   Rehab Potential Good   Clinical Impairments Affecting Rehab Potential This patient presents with 2 personal factors/ comorbidities current situation and hx of falls; and 3  body elements including body structures and functions, activity limitations and or participation restrictions: decreased strength BLE , decreased transfers sit to stand, decreased dynamic and static standing  balance, decreased gait.  Patient's condition is evolving.   PT Frequency 2x / week   PT Duration 8 weeks   PT  Treatment/Interventions Balance training;Therapeutic exercise;Therapeutic activities;Patient/family education   PT Next Visit Plan strengthening and balance training   PT Home Exercise Plan hip abd standing and sidelying   Consulted and Agree with Plan of Care Patient      Patient will benefit from skilled therapeutic intervention in order to improve the following deficits and impairments:  Abnormal gait, Decreased balance, Decreased endurance, Decreased mobility, Difficulty walking, Decreased activity tolerance, Decreased safety awareness, Decreased strength, Pain  Visit Diagnosis: Muscle weakness (generalized)  Difficulty in walking, not elsewhere classified     Problem List Patient Active Problem List   Diagnosis Date Noted  . OA (osteoarthritis) of knee 10/16/2016  . Status post mastectomy 01/15/2016  . Leg swelling 10/11/2015  . Neuropathy involving both lower extremities 06/19/2015  . Secondary localized osteoarthrosis, ankle and foot 06/01/2015  . Acne erythematosa 06/01/2015  . Avitaminosis D 06/01/2015  . Atrial tachycardia ([place]) 08/24/2014  . Essential hypertension 08/17/2012  . Adjustment disorder 08/17/2012  . History of breast cancer 08/17/2012   Alanson Puls, PT, DPT Arelia Sneddon S 05/28/2017, 11:51 AM  [place] MAIN [facility] SERVICES [address] [place], [place], [address] Phone: [phone]   Fax:  [phone]  Name: Kelli Williams MRN: [id number] Date of Birth: July 17, 1926

## 2017-06-02 ENCOUNTER — Encounter: Payer: Self-pay | Admitting: Physical Therapy

## 2017-06-02 ENCOUNTER — Ambulatory Visit: Payer: Medicare Other | Admitting: Physical Therapy

## 2017-06-02 DIAGNOSIS — R262 Difficulty in walking, not elsewhere classified: Secondary | ICD-10-CM

## 2017-06-02 DIAGNOSIS — M6281 Muscle weakness (generalized): Secondary | ICD-10-CM | POA: Diagnosis not present

## 2017-06-02 NOTE — Therapy (Signed)
[name] [facility] SERVICES [address] [place], [place], [address] Phone: [phone]   Fax:  [phone]  Physical Therapy Treatment  Patient Details  Name: Kelli Williams MRN: [id number] Date of Birth: Dec 23, 1925 Referring Provider: Hessie Knows  Encounter Date: 06/02/2017      PT End of Session - 06/02/17 1142    Visit Number 14   Number of Visits 17   Date for PT Re-Evaluation 06/02/17   Authorization Type 4/10   PT Start Time 1132   PT Stop Time 1210   PT Time Calculation (min) 38 min   Equipment Utilized During Treatment Gait belt   Activity Tolerance Patient tolerated treatment well;Patient limited by fatigue   Behavior During Therapy WFL for tasks assessed/performed      Past Medical History:  Diagnosis Date  . Cancer ([place])   . Hypertension   . Neuropathic pain of both legs   . Neuropathy   . Skin cancer of forehead   . Skin cancer of nose     Past Surgical History:  Procedure Laterality Date  . ABDOMINAL HYSTERECTOMY    . BREAST LUMPECTOMY     left  . BREAST LUMPECTOMY    . CATARACT EXTRACTION     left eye  . GALLBLADDER SURGERY    . PARTIAL KNEE ARTHROPLASTY     right  . right knee replacement    . TOTAL VAGINAL HYSTERECTOMY      There were no vitals filed for this visit.      Subjective Assessment - 06/02/17 1140    Subjective She is complianing of knee, feet, back pain.  No specific questions upon arrival regarding her plan of care for therapy.    Patient is accompained by: Family member   Pertinent History Patient had R TKR 7 years ago, she fractured her right foot 3 years ago and wore a boot for 4 months. She strainned her left  knee while her right foot was healing. She went to the MD and they dont want to do any surgery on the left knee. She has had 2 falls in the last 6 months , She lives at the [place] of [place] and her husbance died.    Limitations Standing;Walking   How long can you sit  comfortably? unlimited   How long can you stand comfortably? 30 mins   How long can you walk comfortably? 15 mins   Diagnostic tests x- ray BLE hips and knees   Patient Stated Goals Patient wants to be able to not fall.    Currently in Pain? Yes   Pain Score 6    Pain Location Back   Pain Orientation Lower   Pain Descriptors / Indicators Aching   Pain Type Chronic pain   Pain Onset More than a month ago   Pain Frequency Constant   Aggravating Factors  standing   Pain Relieving Factors medicine   Effect of Pain on Daily Activities difficulty   Multiple Pain Sites Yes   Pain Score 9   Pain Location Foot   Pain Orientation Right;Left   Pain Descriptors / Indicators Aching   Pain Type Chronic pain   Pain Onset More than a month ago   Pain Frequency Constant   Aggravating Factors  walking   Pain Relieving Factors nothing   Pain Score 5   Pain Location Knee   Pain Orientation Right;Left   Pain Descriptors / Indicators Aching   Pain Type Chronic pain  Pain Onset More than a month ago   Pain Frequency Constant   Aggravating Factors  walking   Pain Relieving Factors medicine   Effect of Pain on Daily Activities difficuty      Therapeutic Exercise:  Standing exercises with RTB BLE : SLR 2 x 10; cues for performing slowly Abduction 2 x 10;cues for not leaning towards the opposite side Extension 2 x 10; cues not to lean forward with trun Heel raises 2 x 10; cues to not rock forward Eccentric step downs x 10 BLE; cues to not put too much weight bearing on UE's Squats x 10 with 5 sec hold, cues to maintain erect position Resisted side-steeping RTB 4 lengths x 2;; cues to not rotate trunk towards direction of mobility Standing mini squats 2 x 10 with RTB around knees to encourage abduction; cues to maintain correct erect posture Sit to stand without UE support 2 x 10; cues for technique and posture correction Step-ups to 6" step x 10 bilateral; cues for getting entire foot on  step Quantum leg press 100 # x 20 x 3; cues for not snapping LE in extension and performing slowly 3 way hip with YTB and cues for trunk position and technique                           PT Education - 06/02/17 1142    Education provided Yes   Education Details HEP   Person(s) Educated Patient   Methods Explanation   Comprehension Verbalized understanding             PT Long Term Goals - 05/19/17 1147      PT LONG TERM GOAL #1   Title Patient will be independent in home exercise program to improve strength/mobility for better functional independence with ADLs.   Baseline HEP   Time 8   Period Weeks   Status On-going     PT LONG TERM GOAL #2   Title Patient (> 32 years old) will complete five times sit to stand test in < 15 seconds indicating an increased LE strength and improved balance.   Baseline 25.74 sec; 24.65 sec   Time 8   Period Weeks   Status On-going     PT LONG TERM GOAL #3   Title Patient will increase six minute walk test distance to >1000 for progression to community ambulator and improve gait ability   Baseline 425 feet, 475 feet   Time 8   Period Weeks   Status On-going     PT LONG TERM GOAL #4   Title Patient will increase 10 meter walk test to >1.28m/s as to improve gait speed for better community ambulation and to reduce fall risk.   Baseline .56 m/sec; . 62 m sec   Time 8   Period Weeks   Status On-going     PT LONG TERM GOAL #5   Title Patient will increase BLE gross strength to 4+/5 as to improve functional strength for independent gait, increased standing tolerance and increased ADL ability.   Baseline -3/5 strength B hips flex and aabd   Time 8   Period Weeks   Status On-going               Plan - 06/02/17 1145    Clinical Impression Statement Patient instructed in advanced strengthening and balance exercise. She requires min Vcs for correct exercise technique including to improve trunk control with standing  exercise.  Patient demonstrates better quad control with SLS tasks with rail assist. Patient would benefit from additional skilled PT intervention to improve balance/gait safety and reduce fall risk;   Rehab Potential Good   Clinical Impairments Affecting Rehab Potential This patient presents with 2 personal factors/ comorbidities current situation and hx of falls; and 3  body elements including body structures and functions, activity limitations and or participation restrictions: decreased strength BLE , decreased transfers sit to stand, decreased dynamic and static standing balance, decreased gait.  Patient's condition is evolving.   PT Frequency 2x / week   PT Duration 8 weeks   PT Treatment/Interventions Balance training;Therapeutic exercise;Therapeutic activities;Patient/family education   PT Next Visit Plan strengthening and balance training   PT Home Exercise Plan hip abd standing and sidelying   Consulted and Agree with Plan of Care Patient      Patient will benefit from skilled therapeutic intervention in order to improve the following deficits and impairments:  Abnormal gait, Decreased balance, Decreased endurance, Decreased mobility, Difficulty walking, Decreased activity tolerance, Decreased safety awareness, Decreased strength, Pain  Visit Diagnosis: Muscle weakness (generalized)  Difficulty in walking, not elsewhere classified     Problem List Patient Active Problem List   Diagnosis Date Noted  . OA (osteoarthritis) of knee 10/16/2016  . Status post mastectomy 01/15/2016  . Leg swelling 10/11/2015  . Neuropathy involving both lower extremities 06/19/2015  . Secondary localized osteoarthrosis, ankle and foot 06/01/2015  . Acne erythematosa 06/01/2015  . Avitaminosis D 06/01/2015  . Atrial tachycardia ([place]) 08/24/2014  . Essential hypertension 08/17/2012  . Adjustment disorder 08/17/2012  . History of breast cancer 08/17/2012   Alanson Puls, PT, DPT Arelia Sneddon S 06/02/2017, 11:46 AM  [place] [facility] [address] [place], [place], [address] Phone: [phone]   Fax:  [phone]  Name: Kelli Williams MRN: [id number] Date of Birth: 03-17-1926

## 2017-06-04 ENCOUNTER — Encounter: Payer: Medicare Other | Admitting: Physical Therapy

## 2017-06-06 ENCOUNTER — Other Ambulatory Visit: Payer: Self-pay | Admitting: Internal Medicine

## 2017-06-09 ENCOUNTER — Ambulatory Visit: Payer: Medicare Other | Attending: Orthopedic Surgery | Admitting: Physical Therapy

## 2017-06-09 ENCOUNTER — Encounter: Payer: Self-pay | Admitting: Physical Therapy

## 2017-06-09 DIAGNOSIS — M6281 Muscle weakness (generalized): Secondary | ICD-10-CM

## 2017-06-09 DIAGNOSIS — R262 Difficulty in walking, not elsewhere classified: Secondary | ICD-10-CM | POA: Diagnosis not present

## 2017-06-09 NOTE — Therapy (Signed)
[place] [facility] SERVICES [address] [place], [place], [address] Phone: [phone]   Fax:  [phone]  Physical Therapy Treatment/ Recertification  Patient Details  Name: Kelli Williams MRN: [id number] Date of Birth: 11-04-1926 Referring Provider: Hessie Knows  Encounter Date: 06/09/2017      PT End of Session - 06/09/17 1152    Visit Number 15   Number of Visits 25   Date for PT Re-Evaluation 06/30/17   Authorization Type 5/10   PT Start Time 1145   PT Stop Time 1230   PT Time Calculation (min) 45 min   Equipment Utilized During Treatment Gait belt   Activity Tolerance Patient tolerated treatment well;Patient limited by fatigue   Behavior During Therapy [facility] for tasks assessed/performed      Past Medical History:  Diagnosis Date  . Cancer ([place])   . Hypertension   . Neuropathic pain of both legs   . Neuropathy   . Skin cancer of forehead   . Skin cancer of nose     Past Surgical History:  Procedure Laterality Date  . ABDOMINAL HYSTERECTOMY    . BREAST LUMPECTOMY     left  . BREAST LUMPECTOMY    . CATARACT EXTRACTION     left eye  . GALLBLADDER SURGERY    . PARTIAL KNEE ARTHROPLASTY     right  . right knee replacement    . TOTAL VAGINAL HYSTERECTOMY      There were no vitals filed for this visit.      Subjective Assessment - 06/09/17 1146    Subjective She is complianing of knee, feet, back pain.  No specific questions upon arrival regarding her plan of care for therapy. She was working with her flowers in her windows. She has a watering can and is able to perform this task. She wants to be transfered to the CARE program sponsered by the cancer center.    Patient is accompained by: Family member   Pertinent History Patient had R TKR 7 years ago, she fractured her right foot 3 years ago and wore a boot for 4 months. She strainned her left  knee while her right foot was healing. She went to the MD and they dont  want to do any surgery on the left knee. She has had 2 falls in the last 6 months , She lives at the [place] and her husbance died.    Limitations Standing;Walking   How long can you sit comfortably? unlimited   How long can you stand comfortably? 30 mins   How long can you walk comfortably? 15 mins   Diagnostic tests x- ray BLE hips and knees   Patient Stated Goals Patient wants to be able to not fall.    Currently in Pain? Yes   Pain Score 5    Pain Location Back   Pain Orientation Lower   Pain Descriptors / Indicators Aching   Pain Type Chronic pain   Pain Onset More than a month ago   Pain Frequency Constant   Aggravating Factors  standing   Pain Relieving Factors medicine   Effect of Pain on Daily Activities difficulty   Multiple Pain Sites Yes   Pain Score 9   Pain Location Foot   Pain Orientation Right;Left   Pain Descriptors / Indicators Aching   Pain Type Chronic pain   Pain Onset More than a month ago   Pain Frequency Constant   Aggravating Factors  walking   Pain Relieving Factors nothing   Effect of Pain on Daily Activities difficulty with standing and walking   Pain Score 5   Pain Location Knee   Pain Orientation Right;Left   Pain Descriptors / Indicators Aching   Pain Type Chronic pain   Pain Onset More than a month ago   Pain Frequency Constant   Aggravating Factors  waking   Pain Relieving Factors medicine   Effect of Pain on Daily Activities difficutly      Treatment: Gym equipment in fitness center: Machine knee flex 3 plates x 15 x 2 Machine knee extension 1 plate 15 x 2  Leg press 6 plates x 20  X 2, heel raises x 20 x 2 3 plates  Cues for correct use of equipment and correct LE position during machine work out                            PT Education - 06/09/17 1151    Education provided Yes   Education Details HEP importance   Person(s) Educated Patient   Methods Explanation   Comprehension Verbalized  understanding             PT Long Term Goals - 06/09/17 1203      PT LONG TERM GOAL #1   Title Patient will be independent in home exercise program to improve strength/mobility for better functional independence with ADLs.   Baseline HEP   Time 4   Period Weeks   Status On-going     PT LONG TERM GOAL #2   Title Patient (> 81 years old) will complete five times sit to stand test in < 15 seconds indicating an increased LE strength and improved balance.   Baseline 25.74 sec; 24.65 sec   Time 4   Period Weeks   Status On-going     PT LONG TERM GOAL #3   Title Patient will increase six minute walk test distance to >1000 for progression to community ambulator and improve gait ability   Baseline 425 feet, 475 feet   Time 4   Period Weeks   Status On-going     PT LONG TERM GOAL #4   Title Patient will increase 10 meter walk test to >1.64m/s as to improve gait speed for better community ambulation and to reduce fall risk.   Baseline .56 m/sec; . 62 m sec   Time 4   Period Weeks   Status On-going     PT LONG TERM GOAL #5   Title Patient will increase BLE gross strength to 4+/5 as to improve functional strength for independent gait, increased standing tolerance and increased ADL ability.   Baseline -3/5 strength B hips flex and aabd   Time 4   Period Weeks   Status On-going               Plan - 06/09/17 1157    Clinical Impression Statement Patient was instructed in strengthening exercises and now is being trainned for use of gym equipment for continuing HEP. She is able to perform household chores and light housekeeping activities. She has increased pain in feet, back and knees that interfere with ADLS .   Rehab Potential Good   Clinical Impairments Affecting Rehab Potential This patient presents with 2 personal factors/ comorbidities current situation and hx of falls; and 3  body elements including body structures and functions, activity limitations and or  participation restrictions: decreased strength  BLE , decreased transfers sit to stand, decreased dynamic and static standing balance, decreased gait.  Patient's condition is evolving.   PT Frequency 2x / week   PT Duration 4 weeks   PT Treatment/Interventions Balance training;Therapeutic exercise;Therapeutic activities;Patient/family education   PT Next Visit Plan strengthening and balance training   PT Home Exercise Plan hip abd standing and sidelying   Consulted and Agree with Plan of Care Patient      Patient will benefit from skilled therapeutic intervention in order to improve the following deficits and impairments:  Abnormal gait, Decreased balance, Decreased endurance, Decreased mobility, Difficulty walking, Decreased activity tolerance, Decreased safety awareness, Decreased strength, Pain  Visit Diagnosis: Muscle weakness (generalized) - Plan: PT plan of care cert/re-cert  Difficulty in walking, not elsewhere classified - Plan: PT plan of care cert/re-cert     Problem List Patient Active Problem List   Diagnosis Date Noted  . OA (osteoarthritis) of knee 10/16/2016  . Status post mastectomy 01/15/2016  . Leg swelling 10/11/2015  . Neuropathy involving both lower extremities 06/19/2015  . Secondary localized osteoarthrosis, ankle and foot 06/01/2015  . Acne erythematosa 06/01/2015  . Avitaminosis D 06/01/2015  . Atrial tachycardia ([place]) 08/24/2014  . Essential hypertension 08/17/2012  . Adjustment disorder 08/17/2012  . History of breast cancer 08/17/2012   Alanson Puls, PT, DPT Arelia Sneddon S 06/09/2017, 12:29 PM  [place] [facility] SERVICES [address] [place], [place], [address] Phone: [phone]   Fax:  [phone]  Name: DARLA MCDONALD MRN: [id number] Date of Birth: 17-Jan-1926

## 2017-06-09 NOTE — Addendum Note (Signed)
Addended by: Alanson Puls on: 06/09/2017 02:14 PM   Modules accepted: Orders

## 2017-06-13 ENCOUNTER — Other Ambulatory Visit: Payer: Self-pay | Admitting: Internal Medicine

## 2017-06-13 DIAGNOSIS — Z1231 Encounter for screening mammogram for malignant neoplasm of breast: Secondary | ICD-10-CM

## 2017-06-16 ENCOUNTER — Encounter: Payer: Medicare Other | Admitting: Physical Therapy

## 2017-06-18 ENCOUNTER — Encounter: Payer: Medicare Other | Admitting: Physical Therapy

## 2017-06-19 ENCOUNTER — Ambulatory Visit
Admission: RE | Admit: 2017-06-19 | Discharge: 2017-06-19 | Disposition: A | Discharge status: Home / Self Care | Payer: Medicare Other | Source: Ambulatory Visit | Attending: Internal Medicine | Admitting: Internal Medicine

## 2017-06-19 DIAGNOSIS — Z1231 Encounter for screening mammogram for malignant neoplasm of breast: Secondary | ICD-10-CM | POA: Insufficient documentation

## 2017-06-19 HISTORY — DX: Malignant neoplasm of unspecified site of unspecified female breast: C50.919

## 2017-06-23 ENCOUNTER — Other Ambulatory Visit: Payer: Self-pay | Admitting: Internal Medicine

## 2017-06-23 DIAGNOSIS — R928 Other abnormal and inconclusive findings on diagnostic imaging of breast: Secondary | ICD-10-CM

## 2017-06-23 DIAGNOSIS — R590 Localized enlarged lymph nodes: Secondary | ICD-10-CM

## 2017-06-24 ENCOUNTER — Encounter: Payer: Medicare Other | Admitting: Internal Medicine

## 2017-06-27 ENCOUNTER — Ambulatory Visit (INDEPENDENT_AMBULATORY_CARE_PROVIDER_SITE_OTHER): Payer: Medicare Other | Admitting: Internal Medicine

## 2017-06-27 ENCOUNTER — Encounter: Payer: Self-pay | Admitting: Internal Medicine

## 2017-06-27 VITALS — BP 132/66 | HR 55 | Ht 64.0 in | Wt 198.0 lb

## 2017-06-27 DIAGNOSIS — M170 Bilateral primary osteoarthritis of knee: Secondary | ICD-10-CM

## 2017-06-27 DIAGNOSIS — G5793 Unspecified mononeuropathy of bilateral lower limbs: Secondary | ICD-10-CM | POA: Diagnosis not present

## 2017-06-27 DIAGNOSIS — I471 Supraventricular tachycardia: Secondary | ICD-10-CM

## 2017-06-27 DIAGNOSIS — K589 Irritable bowel syndrome without diarrhea: Secondary | ICD-10-CM | POA: Insufficient documentation

## 2017-06-27 DIAGNOSIS — L719 Rosacea, unspecified: Secondary | ICD-10-CM

## 2017-06-27 DIAGNOSIS — I10 Essential (primary) hypertension: Secondary | ICD-10-CM

## 2017-06-27 DIAGNOSIS — K1370 Unspecified lesions of oral mucosa: Secondary | ICD-10-CM | POA: Insufficient documentation

## 2017-06-27 DIAGNOSIS — Z6833 Body mass index (BMI) 33.0-33.9, adult: Secondary | ICD-10-CM | POA: Diagnosis not present

## 2017-06-27 DIAGNOSIS — Z853 Personal history of malignant neoplasm of breast: Secondary | ICD-10-CM

## 2017-06-27 DIAGNOSIS — Z0000 Encounter for general adult medical examination without abnormal findings: Secondary | ICD-10-CM

## 2017-06-27 LAB — POCT URINALYSIS DIPSTICK
Bilirubin, UA: NEGATIVE
Blood, UA: NEGATIVE
Glucose, UA: NEGATIVE
Ketones, UA: NEGATIVE
Leukocytes, UA: NEGATIVE
Nitrite, UA: NEGATIVE
Protein, UA: NEGATIVE
Spec Grav, UA: <=1.005 — AB (ref 1.010–1.025)
Urobilinogen, UA: 0.2 E.U./dL
pH, UA: 6 (ref 5.0–8.0)

## 2017-06-27 MED ORDER — OMEPRAZOLE 20 MG PO CPDR: 20.0000 mg | DELAYED_RELEASE_CAPSULE | Freq: Every day | ORAL | 1 refills | Status: DC

## 2017-06-27 MED ORDER — LISINOPRIL-HYDROCHLOROTHIAZIDE 20-25 MG PO TABS: 1.5000 | ORAL_TABLET | Freq: Every day | ORAL | 0 refills | Status: DC

## 2017-06-27 MED ORDER — MELOXICAM 15 MG PO TABS: 15.0000 mg | ORAL_TABLET | Freq: Every day | ORAL | 1 refills | Status: DC

## 2017-06-27 NOTE — Patient Instructions (Addendum)
Health Maintenance  Topic Date Due  . INFLUENZA VACCINE  07/09/2017  . TETANUS/TDAP  06/08/2021  . DEXA SCAN  Completed  . PNA vac Low Risk Adult  Completed    Schedule an appointment with the Oral surgeon about the lump in your mouth.  Consider getting the new Shingrix (shingles) vaccine.

## 2017-06-27 NOTE — Progress Notes (Signed)
Patient: Kelli Williams, Female    DOB: [birth year], 83 y.o.   MRN: [id number] Visit Date: 06/27/2017  Today's Provider: Halina Maidens, MD   Chief Complaint  Patient presents with  . Medicare Wellness  . Hypertension   Subjective:    Annual wellness visit TYRIA Williams is a 83 y.o. female who presents today for her Subsequent Annual Wellness Visit. She feels fairly well. She reports exercising with Care program twice a week. She reports she is sleeping fairly well. Her recent mammogram showed a shadow - she is getting extra views next week.  ----------------------------------------------------------- Hypertension  This is a chronic problem. The problem is controlled. Pertinent negatives include no chest pain, headaches, palpitations or shortness of breath. Past treatments include ACE inhibitors and diuretics. The current treatment provides significant improvement.  Hip Pain   There was no injury mechanism. The pain is present in the left hip and left knee. Quality: xrays show bone on bone and needs replacement but she is too old. The pain is moderate. Associated symptoms include numbness (neuropathy on right leg since fracture). She has tried NSAIDs for the symptoms. The treatment provided mild relief.   Tachycardia - has intermittent symptoms.  Recently seen by Dr. Rockey Situ who made no changes to her medication.   He felt that she was a reasonable candidate for knee surgery.  Abdominal pain - seen by GI, felt to be IBS and mild GERD.  Now taking omeprazole with good sx relief.  Oral lesion - soft mass on upper palate found by dentist.  Oral surgery evaluation recommended - pt has not done this yet.  Neuropathy - mainly in right leg since foot fx.  Taking gabapentin and feels that is does relieve her sx of burning pain. No side effects or somnolence to gabapentin noted by patient.  Review of Systems  Constitutional: Negative for chills, fatigue and fever.  HENT: Negative for  congestion, hearing loss, tinnitus, trouble swallowing and voice change.   Eyes: Negative for visual disturbance.  Respiratory: Negative for cough, chest tightness, shortness of breath and wheezing.   Cardiovascular: Positive for leg swelling. Negative for chest pain and palpitations.       No breast mass or tenderness  Gastrointestinal: Negative for abdominal pain, constipation, diarrhea and vomiting.  Endocrine: Negative for polydipsia and polyuria.  Genitourinary: Negative for dysuria, frequency, genital sores, vaginal bleeding and vaginal discharge.  Musculoskeletal: Positive for arthralgias and gait problem. Negative for joint swelling.  Skin: Positive for rash (roseacea of face). Negative for color change.  Neurological: Positive for numbness (neuropathy on right leg since fracture). Negative for dizziness, tremors, light-headedness and headaches.  Hematological: Negative for adenopathy. Does not bruise/bleed easily.  Psychiatric/Behavioral: Negative for dysphoric mood and sleep disturbance. The patient is not nervous/anxious.     Social History   Social History  . Marital status: Widowed    Spouse name: N/A  . Number of children: N/A  . Years of education: N/A   Occupational History  . Not on file.   Social History Main Topics  . Smoking status: Never Smoker  . Smokeless tobacco: Never Used  . Alcohol use No  . Drug use: No  . Sexual activity: Not on file   Other Topics Concern  . Not on file   Social History Narrative   ** Merged History Encounter **        Patient Active Problem List   Diagnosis Date Noted  . OA (osteoarthritis) of knee 10/16/2016  .  Leg swelling 10/11/2015  . Neuropathy involving both lower extremities 06/19/2015  . Secondary localized osteoarthrosis, ankle and foot 06/01/2015  . Acne erythematosa 06/01/2015  . Avitaminosis D 06/01/2015  . Atrial tachycardia ([place]) 08/24/2014  . Essential hypertension 08/17/2012  . History of breast cancer  08/17/2012    Past Surgical History:  Procedure Laterality Date  . BREAST BIOPSY Bilateral 1960   benign  . BREAST LUMPECTOMY Left 2000   breast ca with rad tx  . CATARACT EXTRACTION     left eye  . CHOLECYSTECTOMY    . PARTIAL KNEE ARTHROPLASTY     right  . TOTAL VAGINAL HYSTERECTOMY      Her family history includes Breast cancer (age of onset: 41) in her daughter; Heart attack in her paternal uncle; Heart disease in her mother.     Previous Medications   ASPIRIN 81 MG TABLET    Take 1 tablet by mouth daily.   B COMPLEX-FOLIC ACID (BENFOTIAMINE MULTI-B) CAPS    Take 1 tablet by mouth daily.   CALCIUM-MAGNESIUM 500-250 MG TABS    Take by mouth daily.   CHOLECALCIFEROL 1000 UNITS CAPSULE    Take 1 capsule by mouth daily.   COENZYME Q10 (CO Q-10) 100 MG CAPS    Take by mouth daily.   DOCOSAHEXAENOIC ACID PO    Take by mouth.   GABAPENTIN (NEURONTIN) 300 MG CAPSULE    Take 3 capsules (900 mg total) by mouth 2 (two) times daily.   IBUPROFEN (ADVIL,MOTRIN) 600 MG TABLET    Take 600 mg by mouth every 6 (six) hours as needed.   LISINOPRIL-HYDROCHLOROTHIAZIDE (PRINZIDE,ZESTORETIC) 20-25 MG TABLET    TAKE 1 AND 1/2 TABLETS BY  MOUTH DAILY   MONTELUKAST (SINGULAIR) 10 MG TABLET       MULTIPLE VITAMINS-MINERALS (MULTIVITAMIN WITH MINERALS) TABLET    Take 1 tablet by mouth daily.   OMEGA-3 FATTY ACIDS (FISH OIL CONCENTRATE) 300 MG CAPS    Take by mouth.   OMEPRAZOLE (PRILOSEC) 10 MG CAPSULE    Take 10 mg by mouth daily.   PROPRANOLOL (INDERAL) 10 MG TABLET    Take 1 tablet by mouth up  to 3 times daily as needed   THIAMINE 100 MG TABLET    Take by mouth.    Patient Care Team: Glean Hess, MD as PCP - General (Family Medicine) Rockey Situ Kathlene November, MD as Consulting Physician (Cardiology) Forest Gleason, MD (Oncology) Jannet Mantis, MD (Dermatology) Wylene Simmer, MD as Consulting Physician (Orthopedic Surgery) Renata Caprice as Physician Assistant (Orthopedic Surgery)       Objective:   Vitals: BP 132/66   Pulse (!) 55   Ht 5\' 4"  (1.626 m)   Wt 198 lb (89.8 kg)   SpO2 96%   BMI 33.99 kg/m   Physical Exam  Constitutional: She is oriented to person, place, and time. She appears well-developed and well-nourished. No distress.  HENT:  Head: Normocephalic and atraumatic.  Right Ear: Tympanic membrane and ear canal normal.  Left Ear: Tympanic membrane and ear canal normal.  Nose: Right sinus exhibits no maxillary sinus tenderness. Left sinus exhibits no maxillary sinus tenderness.  Mouth/Throat: Uvula is midline and oropharynx is clear and moist.    Eyes: Conjunctivae and EOM are normal. Right eye exhibits no discharge. Left eye exhibits no discharge. No scleral icterus.  Neck: Normal range of motion. Carotid bruit is not present. No erythema present. No thyromegaly present.  Cardiovascular: Normal rate, regular rhythm, normal  heart sounds and normal pulses.   Pulmonary/Chest: Effort normal. No respiratory distress. She has no wheezes. Right breast exhibits no mass, no nipple discharge, no skin change and no tenderness. Left breast exhibits no mass, no nipple discharge, no skin change and no tenderness.  Abdominal: Soft. Bowel sounds are normal. There is no hepatosplenomegaly. There is no tenderness. There is no CVA tenderness.  Musculoskeletal: She exhibits edema and tenderness.       Right hip: She exhibits decreased range of motion.       Left hip: She exhibits decreased range of motion.       Right knee: She exhibits decreased range of motion. She exhibits no swelling and no effusion.       Left knee: She exhibits decreased range of motion, swelling and effusion.  Lymphadenopathy:    She has no cervical adenopathy.    She has no axillary adenopathy.  Neurological: She is alert and oriented to person, place, and time. She has normal reflexes. No cranial nerve deficit or sensory deficit. Gait normal.  Skin: Skin is warm, dry and intact. No rash  noted.  Psychiatric: She has a normal mood and affect. Her speech is normal and behavior is normal. Thought content normal.  Nursing note and vitals reviewed.   Activities of Daily Living In your present state of health, do you have any difficulty performing the following activities: 06/27/2017 10/16/2016  Hearing? N N  Vision? N N  Difficulty concentrating or making decisions? N N  Walking or climbing stairs? Y Y  Dressing or bathing? N N  Doing errands, shopping? Y N  Preparing Food and eating ? N -  Using the Toilet? N -  In the past six months, have you accidently leaked urine? Y -  Do you have problems with loss of bowel control? N -  Managing your Medications? N -  Managing your Finances? N -  Housekeeping or managing your Housekeeping? Y -  Some recent data might be hidden    Fall Risk Assessment Fall Risk  06/27/2017 10/16/2016 06/19/2016 06/19/2015  Falls in the past year? Yes Yes Yes No  Number falls in past yr: 2 or more 1 1 -  Injury with Fall? No Yes No -  Risk Factor Category  High Fall Risk - - -  Risk for fall due to : - - Impaired balance/gait -  Follow up - Falls prevention discussed;Follow up appointment Falls prevention discussed -     Depression Screen PHQ 2/9 Scores 06/27/2017 10/16/2016 06/19/2016 06/19/2015  PHQ - 2 Score 2 0 0 1  PHQ- 9 Score 2 - - -   6CIT Screen 06/27/2017  What Year? 0 points  What month? 0 points  What time? 0 points  Count back from 20 0 points  Months in reverse 0 points  Repeat phrase 2 points  Total Score 2      Medicare Annual Wellness Visit Summary:  Reviewed patient's Family Medical History Reviewed and updated list of patient's medical providers Assessment of cognitive impairment was done Assessed patient's functional ability Established a written schedule for health screening Ingenio Completed and Reviewed  Exercise Activities and Dietary recommendations Goals    None      Immunization  History  Administered Date(s) Administered  . Influenza,inj,Quad PF,36+ Mos 10/16/2016  . Influenza-Unspecified 09/02/2015  . Pneumococcal Conjugate-13 09/08/2014  . Pneumococcal Polysaccharide-23 09/14/1994, 04/30/1997  . Pneumococcal-Unspecified 09/14/1994  . Tdap 06/09/2011  . Zoster 05/12/2006  Health Maintenance  Topic Date Due  . INFLUENZA VACCINE  07/09/2017  . TETANUS/TDAP  06/08/2021  . DEXA SCAN  Completed  . PNA vac Low Risk Adult  Completed    Discussed health benefits of physical activity, and encouraged her to engage in regular exercise appropriate for her age and condition.    ------------------------------------------------------------------------------------------------------------  Assessment & Plan:   1. Medicare annual wellness visit, subsequent Measures satisfied - POCT urinalysis dipstick  2. Atrial tachycardia (HCC) controlled - TSH  3. Essential hypertension Stable on current medication - CBC with Differential/Platelet - Comprehensive metabolic panel  4. Neuropathy involving both lower extremities Begin once daily mobic; can supplement with tylenol if needed but do not take Aleve/Advil - meloxicam (MOBIC) 15 MG tablet; Take 1 tablet (15 mg total) by mouth daily.  Dispense: 90 tablet; Refill: 1  5. Acne erythematosa Follow up with Dermatology if needed  6. History of breast cancer Additional mammogram and [place] scheduled  7. BMI 33.0-33.9,adult Unable to exercise due to age, balance issues and OA  8. Irritable bowel syndrome without diarrhea Improved sx on PPI - omeprazole (PRILOSEC) 20 MG capsule; Take 1 capsule (20 mg total) by mouth daily.  Dispense: 90 capsule; Refill: 1  9. Oral lesion Recommend Oral surgery consultation   Meds ordered this encounter  Medications  . omeprazole (PRILOSEC) 20 MG capsule    Sig: Take 1 capsule (20 mg total) by mouth daily.    Dispense:  90 capsule    Refill:  1  . meloxicam (MOBIC) 15 MG tablet      Sig: Take 1 tablet (15 mg total) by mouth daily.    Dispense:  90 tablet    Refill:  1  . lisinopril-hydrochlorothiazide (PRINZIDE,ZESTORETIC) 20-25 MG tablet    Sig: Take 1.5 tablets by mouth daily.    Dispense:  135 tablet    Refill:  0    Halina Maidens, MD [facility]  06/27/2017

## 2017-06-28 LAB — COMPREHENSIVE METABOLIC PANEL
ALT: 10 IU/L (ref 0–32)
AST: 19 IU/L (ref 0–40)
Albumin/Globulin Ratio: 1.4 (ref 1.2–2.2)
Albumin: 4.2 g/dL (ref 3.5–4.7)
Alkaline Phosphatase: 96 IU/L (ref 39–117)
BUN/Creatinine Ratio: 18 (ref 12–28)
BUN: 16 mg/dL (ref 8–27)
Bilirubin Total: 0.4 mg/dL (ref 0.0–1.2)
CO2: 25 mmol/L (ref 20–29)
Calcium: 9.2 mg/dL (ref 8.7–10.3)
Chloride: 96 mmol/L (ref 96–106)
Creatinine, Ser: 0.87 mg/dL (ref 0.57–1.00)
GFR calc Af Amer: 68 mL/min/{1.73_m2} (ref >59)
GFR calc non Af Amer: 59 mL/min/{1.73_m2} — ABNORMAL LOW (ref >59)
Globulin, Total: 3.1 g/dL (ref 1.5–4.5)
Glucose: 74 mg/dL (ref 65–99)
Potassium: 4.4 mmol/L (ref 3.5–5.2)
Sodium: 137 mmol/L (ref 134–144)
Total Protein: 7.3 g/dL (ref 6.0–8.5)

## 2017-06-28 LAB — CBC WITH DIFFERENTIAL/PLATELET
Basophils Absolute: 0.1 10*3/uL (ref 0.0–0.2)
Basos: 1 %
EOS (ABSOLUTE): 0.4 10*3/uL (ref 0.0–0.4)
Eos: 5 %
Hematocrit: 38.9 % (ref 34.0–46.6)
Hemoglobin: 13.1 g/dL (ref 11.1–15.9)
Immature Grans (Abs): 0 10*3/uL (ref 0.0–0.1)
Immature Granulocytes: 0 %
Lymphocytes Absolute: 1.6 10*3/uL (ref 0.7–3.1)
Lymphs: 21 %
MCH: 29 pg (ref 26.6–33.0)
MCHC: 33.7 g/dL (ref 31.5–35.7)
MCV: 86 fL (ref 79–97)
Monocytes Absolute: 0.6 10*3/uL (ref 0.1–0.9)
Monocytes: 8 %
Neutrophils Absolute: 5.2 10*3/uL (ref 1.4–7.0)
Neutrophils: 65 %
Platelets: 282 10*3/uL (ref 150–379)
RBC: 4.51 x10E6/uL (ref 3.77–5.28)
RDW: 14.4 % (ref 12.3–15.4)
WBC: 7.9 10*3/uL (ref 3.4–10.8)

## 2017-06-28 LAB — TSH: TSH: 2.24 u[IU]/mL (ref 0.450–4.500)

## 2017-07-01 ENCOUNTER — Other Ambulatory Visit: Payer: Self-pay | Admitting: Internal Medicine

## 2017-07-01 ENCOUNTER — Ambulatory Visit
Admission: RE | Admit: 2017-07-01 | Discharge: 2017-07-01 | Disposition: A | Discharge status: Home / Self Care | Payer: Medicare Other | Source: Ambulatory Visit | Attending: Internal Medicine | Admitting: Internal Medicine

## 2017-07-01 DIAGNOSIS — R928 Other abnormal and inconclusive findings on diagnostic imaging of breast: Secondary | ICD-10-CM

## 2017-07-01 DIAGNOSIS — N6489 Other specified disorders of breast: Secondary | ICD-10-CM | POA: Diagnosis not present

## 2017-07-01 DIAGNOSIS — R590 Localized enlarged lymph nodes: Secondary | ICD-10-CM | POA: Diagnosis not present

## 2017-07-01 DIAGNOSIS — R2232 Localized swelling, mass and lump, left upper limb: Secondary | ICD-10-CM

## 2017-07-10 ENCOUNTER — Ambulatory Visit
Admission: RE | Admit: 2017-07-10 | Discharge: 2017-07-10 | Disposition: A | Discharge status: Home / Self Care | Payer: Medicare Other | Source: Ambulatory Visit | Attending: Internal Medicine | Admitting: Internal Medicine

## 2017-07-10 DIAGNOSIS — R928 Other abnormal and inconclusive findings on diagnostic imaging of breast: Secondary | ICD-10-CM

## 2017-07-10 DIAGNOSIS — C50912 Malignant neoplasm of unspecified site of left female breast: Secondary | ICD-10-CM | POA: Diagnosis not present

## 2017-07-10 DIAGNOSIS — R2232 Localized swelling, mass and lump, left upper limb: Secondary | ICD-10-CM

## 2017-07-10 DIAGNOSIS — C50412 Malignant neoplasm of upper-outer quadrant of left female breast: Secondary | ICD-10-CM

## 2017-07-10 DIAGNOSIS — R229 Localized swelling, mass and lump, unspecified: Secondary | ICD-10-CM | POA: Diagnosis not present

## 2017-07-10 HISTORY — DX: Malignant neoplasm of upper-outer quadrant of left female breast: C50.412

## 2017-07-10 HISTORY — PX: AXILLARY LYMPH NODE BIOPSY: SHX5737

## 2017-07-11 ENCOUNTER — Other Ambulatory Visit: Payer: Self-pay | Admitting: Pathology

## 2017-07-11 DIAGNOSIS — C50412 Malignant neoplasm of upper-outer quadrant of left female breast: Secondary | ICD-10-CM | POA: Diagnosis not present

## 2017-07-11 NOTE — Progress Notes (Unsigned)
MDT

## 2017-07-14 ENCOUNTER — Encounter: Payer: Self-pay | Admitting: *Deleted

## 2017-07-14 ENCOUNTER — Other Ambulatory Visit: Payer: Self-pay | Admitting: Internal Medicine

## 2017-07-14 DIAGNOSIS — D0592 Unspecified type of carcinoma in situ of left breast: Secondary | ICD-10-CM | POA: Insufficient documentation

## 2017-07-14 DIAGNOSIS — C50912 Malignant neoplasm of unspecified site of left female breast: Secondary | ICD-10-CM

## 2017-07-14 NOTE — Progress Notes (Signed)
Oncology Nurse Navigator Documentation    Oncology Nurse Navigator Documentation  Navigator Location: [facility] (07/14/17 1500)   )Navigator Encounter Type: Introductory phone call (07/14/17 1500)   Abnormal Finding Date: 07/01/17 (07/14/17 1500) Confirmed Diagnosis Date: 07/11/17 (07/14/17 1500)                   Barriers/Navigation Needs: Coordination of Care (07/14/17 1500)   Interventions: Coordination of Care (07/14/17 1500)                      Time Spent with Patient: 30 (07/14/17 1500)   Called patient to establish navigation services.  Patient is newly diagnosed with invasive mammary carcinoma.  Patient also has a history of ipsilateral breast cancer in 2000.  States she had a lumpectomy at that time.  I have left Tia Thaxton, one of a schedulers a message that the patient would like to see Dr. Mike Gip since her daughter is a patient of hers.  I have also talked with Dr. Dwyane Luo office for a surgical consult appointment.  They are going to call me back.  Patient informed I would call her tomorrow with all her appointments.  She is agreeable.

## 2017-07-15 ENCOUNTER — Encounter: Payer: Self-pay | Admitting: *Deleted

## 2017-07-15 ENCOUNTER — Inpatient Hospital Stay: Payer: Self-pay

## 2017-07-15 ENCOUNTER — Telehealth: Payer: Self-pay

## 2017-07-15 ENCOUNTER — Ambulatory Visit (INDEPENDENT_AMBULATORY_CARE_PROVIDER_SITE_OTHER): Payer: Medicare Other | Admitting: General Surgery

## 2017-07-15 VITALS — BP 150/82 | HR 56 | Ht 66.0 in | Wt 195.0 lb

## 2017-07-15 DIAGNOSIS — C50412 Malignant neoplasm of upper-outer quadrant of left female breast: Secondary | ICD-10-CM

## 2017-07-15 DIAGNOSIS — N6320 Unspecified lump in the left breast, unspecified quadrant: Secondary | ICD-10-CM

## 2017-07-15 NOTE — Progress Notes (Addendum)
Patient ID: Kelli Williams, female   DOB: Feb 06, 1926, 83 y.o.   MRN: [id number]  Chief Complaint  Patient presents with  . Other    HPI Kelli Williams is a 83 y.o. female who presents for a breast evaluation. The most recent mammogram was done on 07/01/2017 and left breast biopsy on 07/10/2017. She has a history of left breast cancer with lumpectomy and radiation in 2000.  Sharman Crate, MD was her surgeon at that time.   Patient does not perform regular self breast checks and gets regular mammograms done.   The patient's husband passed 2 years ago.  She was born outside of [place].    She is followed by Ida Rogue, M.D. from cardiology. She reports that he is told her her heart is normal, although she occasionally makes use of propanolol for tachycardia. No associated chest pain during these episodes.   She is here with her daughter, Joeline Freer RN. This woman is followed by Dr. Mike Gip for her own breast cancer. She is presently on medical disability.  HPI  Past Medical History:  Diagnosis Date  . Breast cancer ([place]) 2000   left breast ca with lumpectomy and rad tx 5 yr tamoxifen/Dr Jeannine Kitten at [facility]  . Hypertension   . Neuropathic pain of both legs   . Neuropathy   . Skin cancer of forehead   . Skin cancer of nose     Past Surgical History:  Procedure Laterality Date  . AXILLARY LYMPH NODE BIOPSY Left 07/10/2017   INVASIVE MAMMARY CARCINOMA WITH FOCAL LOBULAR FEATURES.   Marland Kitchen BREAST BIOPSY Bilateral 1960   benign  . BREAST LUMPECTOMY Left 2000   breast ca with rad tx  . CATARACT EXTRACTION     left eye  . CHOLECYSTECTOMY    . PARTIAL KNEE ARTHROPLASTY     right  . TOTAL VAGINAL HYSTERECTOMY      Family History  Problem Relation Age of Onset  . Heart disease Mother   . Heart attack Paternal Uncle   . Breast cancer Daughter 33       genetic negative    Social History Social History  Substance Use Topics  . Smoking status: Never Smoker  . Smokeless tobacco:  Never Used  . Alcohol use No    Allergies  Allergen Reactions  . Amitriptyline Hives  . Amoxicillin     Diarrhea    . Sulfa Antibiotics Rash  . Sulfa Antibiotics Rash    Current Outpatient Prescriptions  Medication Sig Dispense Refill  . aspirin 81 MG tablet Take 1 tablet by mouth daily.    . B Complex-Folic Acid (BENFOTIAMINE MULTI-B) CAPS Take 1 tablet by mouth daily.    . Calcium-Magnesium 500-250 MG TABS Take by mouth daily.    . Cholecalciferol 1000 UNITS capsule Take 1 capsule by mouth daily.    . Coenzyme Q10 (CO Q-10) 100 MG CAPS Take by mouth daily.    . DOCOSAHEXAENOIC ACID PO Take by mouth.    . gabapentin (NEURONTIN) 300 MG capsule Take 3 capsules (900 mg total) by mouth 2 (two) times daily. 540 capsule 3  . ibuprofen (ADVIL,MOTRIN) 600 MG tablet Take 600 mg by mouth every 6 (six) hours as needed.    Marland Kitchen lisinopril-hydrochlorothiazide (PRINZIDE,ZESTORETIC) 20-25 MG tablet Take 1.5 tablets by mouth daily. 135 tablet 0  . meloxicam (MOBIC) 15 MG tablet Take 1 tablet (15 mg total) by mouth daily. 90 tablet 1  . montelukast (SINGULAIR) 10 MG tablet     .  Multiple Vitamins-Minerals (MULTIVITAMIN WITH MINERALS) tablet Take 1 tablet by mouth daily.    . Omega-3 Fatty Acids (FISH OIL CONCENTRATE) 300 MG CAPS Take by mouth.    Marland Kitchen omeprazole (PRILOSEC) 20 MG capsule Take 1 capsule (20 mg total) by mouth daily. 90 capsule 1  . propranolol (INDERAL) 10 MG tablet Take 1 tablet by mouth up  to 3 times daily as needed 270 tablet 3  . thiamine 100 MG tablet Take by mouth.     No current facility-administered medications for this visit.     Review of Systems Review of Systems  Constitutional: Negative.   Respiratory: Negative.   Cardiovascular: Negative.     Blood pressure (!) 150/82, pulse (!) 56, height _0  (1.676 m), weight 195 lb (88.5 kg), SpO2 95 %.  Physical Exam Physical Exam  Constitutional: She is oriented to person, place, and time. She appears well-developed and  well-nourished.  HENT:  Mouth/Throat: Oropharynx is clear and moist.  Eyes: Conjunctivae are normal. No scleral icterus.  Neck: Neck supple.  Cardiovascular: Normal rate, regular rhythm and normal heart sounds.   Pulmonary/Chest: Effort normal and breath sounds normal. Right breast exhibits no inverted nipple, no mass, no nipple discharge, no skin change and no tenderness. Left breast exhibits no inverted nipple, no mass, no nipple discharge, no skin change and no tenderness.  Lumpectomy site left breast  Abdominal: Soft.  Lymphadenopathy:    She has no cervical adenopathy.    She has no axillary adenopathy.  Neurological: She is alert and oriented to person, place, and time.  Skin: Skin is warm and dry.  Psychiatric: Her behavior is normal.    Data Reviewed 06/19/2017 screening mammogram showed a new density in the upper-outer quadrant of the left breast. Subsequent ultrasound showed a 1 x 1.1 x 1.3 cm lobulated mass. BI-RADS-4. These films were reviewed. Calcified density in the upper-outer quadrant likely related to previous surgery/radiation.  Subsequent ultrasound-guided core biopsy dated 07/11/2017 was completed.  Pathology: A. LEFT AXILLA; ULTRASOUND-GUIDED NEEDLE CORE BIOPSY:  - INVASIVE MAMMARY CARCINOMA WITH FOCAL LOBULAR FEATURES.   Size of invasive carcinoma: 1.1 cm in this sample  Histologic grade of invasive carcinoma: Grade 2    Glandular/tubular differentiation score: 3    Nuclear pleomorphism score: 2    Mitotic rate score: 1   BREAST BIOMARKER TESTS  Estrogen Receptor (ER) Status: POSITIVE, >90% nuclear staining    Average intensity of staining: Strong  Progesterone Receptor (PgR) Status: POSITIVE, >90% nuclear staining    Average intensity of staining: Strong  HER2 (by immunohistochemistry): EQUIVOCAL, 2+  Percentage of cells with uniform intense complete membrane staining: 0%    Ultrasound exam of the left breast was completed to determine  whether preoperative localization procedure would be required. In the upper-outer quadrant of the left breast at the 2:00 position, 8 cm from the nipple a hypoechoic area within shadowing likely associated with the mammographically identified calcification is noted. This area measures of 0.54 x 0.57 x 0.95 cm. This is about a centimeter below the skin.  In the axilla a multilobulated mass which could represent a lymph node versus breast parenchyma (latter suspected) is noted measuring 0.9 x 1.16 x 1.18 cm. This appears to be in the breast parenchyma. No adjacent nodal disease is appreciated. BI-RADS-6.   07/18/1999 pathology from left breast cancer completed at [facility]: DIAGNOSIS: A. "SENTINEL NODE #1, BLUE AND HOT" (EXCISION):     ONE LYMPH NODE, NO TUMOR IS SEEN.  IMMUNOSTAINING FOR CYTOKERATIN REVEALS NO TUMOR.   B. "NON-SENTINEL NODE #1, NOT BLUE" (EXCISION):     ONE LYMPH NODE, NO TUMOR IS SEEN.   C. "NON-SENTINEL NODE #2, NOT BLUE" (EXCISION):     ONE LYMPH NODE, NO TUMOR IS SEEN.   D. "SENTINEL NODE #2, HOT-NOT BLUE" ,(EXCISION):     ONE LYMPH NODE, NO TUMOR IS SEEN.    IMMUNOSTAINING FOR CYTOKERATIN REVEALS NO TUMOR.   E. "LEFT BREAST TISSUE" (WIDE EXCISION):     RESIDUAL INVASIVE ADENOCARCINOMA OF THE BREAST IS PRESENT ADJACENT TO THE    PREVIOUS BIOPSY CAVITY.       HISTOLOGIC TYPE - DUCTAL.       N.S.A.B.P. NUCLEAR GRADE - 2 OF 3.       N.S.A.B.P. HISTOLOGIC GRADE - 2 OF 3.       GROSS TUMOR SIZE - 0.6 X 0.5 X 0.4 CM.       SIZE OF INVASIVE COMPONENT - 0.8 X 0.6 X 0.4 CM.       LYMPHATIC/VASCULAR INVASION - ABSENT.       MULTIFOCAL TUMOR - ABSENT.     IN-SITU CARCINOMA - PRESENT.       TYPE OF IN-SITU CARCINOMA - CRIBRIFORM.       NUCLEAR GRADE OF IN-SITU CARCINOMA - 2 OF 3.       NECROSIS - ABSENT.       DCIS EXTENDING OUTSIDE INVASIVE TUMOR MASS - ABSENT.     SIZE OF BIOPSY - 4.6 X 4.3 X 1.7 CM.    MICROCALCIFICATIONS - ABSENT.    SURGICAL  MARGIN STATUS - NEGATIVE.     ESTROGEN/PROGESTERONE RECEPTOR, CELL CYCLE, AND HER2/NEU ANALYSIS -    PENDING.       METHODOLOGY: IMMUNOHISTOCHEMISTRY, PARAFFIN BLOCK NUMBER - E5.       RESULTS WILL BE ISSUED IN AN ADDENDUM. ADDENDUM 1: NUCLEAR ESTROGEN AND PROGESTERONE RECEPTOR ANALYSIS      A tissue block was sent to the [facility] for assay of nuclear estrogen and progesterone receptors (block E5).  The ESTROGEN RECEPTOR activity is judged to be POSITIVE with an estimated fmol/mg cytosolic protein value of 95.  Approximately 50% of the infiltrating carcinoma cells exhibit nuclear estrogen receptor expression.  Benign ductal epithelium stains positively and serves as the internal control.  Results were obtained using a manual method with Signet antibodies and a BioGenex detection kit.  The PROGESTERONE RECEPTOR activity is judged to be POSITIVE with an estimated fmol/mg cytosolic protein value of 111.  Approximately 90% of the tumor cells exhibit nuclear progesterone receptor expression.  Benign ductal epithelium stains positively and serves as the internal control. Results were obtained using a manual method with Signet antibodies and a BioGenex detection kit. Please refer to [id number] for a complete report.   Assessment    New left breast cancer, unlikely simple metastatic disease to an axillary node based on 2000 pathology report.    Plan    The patient is medically in good health, although she has several orthopedic issues. Options for management were reviewed: 1) standard of care-salvage mastectomy versus 2) re-excision with or without repeat radiation to the area versus 3) antiestrogen therapy alone.  I would not recommend option 3 considering her overall functional good status although we are and somewhat uncharted territory based on her age.  The patient has not coming appointment with Nolon Stalls, M.D. for medical oncology and we'll look forward to  her thoughts.    Unless  additional imaging studies would change treatment, less likely with lobular pathology, I would not push for an MRI or PET scan based on present findings.  Attempts to contact pathology to determine if there is any suggestion of that this was tumor in a lymph node rather than a new breast primary was unsuccessful today.   HPI, Physical Exam, Assessment and Plan have been scribed under the direction and in the presence of Robert Bellow, MD. Karie Fetch, RN   Robert Bellow 07/15/2017, 9:20 PM

## 2017-07-15 NOTE — Telephone Encounter (Signed)
Patient's daughter Olean Ree [facility] called stating her mother (patient) was supposed to have referral sent in for Dr Mike Gip but because of our clinics mistake she is now having to see a doctor who she is unfamiliar with and has gotten his degree outside of the country. I informed pt we did do the referral for pt to see Dr Mike Gip. And she states well now Dr Geralyn Flash is no longer seeing new patients so she will not take her as a patient,. I apologized to the patients daughter and informed her if Dr Mike Gip is no longer seeing new patients then there is nothing we can do. I tried reassuring her that we did everything we can do. She still insisted it was something Dr Army Melia did wrong as to why the patient could not be seen with Dr Mike Gip. I informed pt's daughter- Olean Ree [facility] - that we did what we could and if there is anyone else she wants [place] to send her to then we can do that. She stated she is going to write a review on our office and the hospital for the way her and her mother has been mistreated and how he mother has been treated like a number and not with care. I apologized to pt and she said someone was calling in on another line and maybe they can help her and she hung up.

## 2017-07-15 NOTE — Progress Notes (Signed)
  Oncology Nurse Navigator Documentation  Navigator Location: [facility] Onc (07/15/17 1500)   )Navigator Encounter Type: Telephone (07/15/17 1500)                         Barriers/Navigation Needs: Coordination of Care (07/15/17 1500)                          Time Spent with Patient: 24 (07/15/17 1500)   Patients daughter, Larena Glassman, called earlier today and left a message that she was very upset that her mom did not have an appointment with Dr. Mike Gip.  Discussed with cancer center administration.  Approval given to schedule patient with Dr. Mike Gip.  Appointment scheduled for Thursday, August 9th at 2:30.  Patients daughter, Larena Glassman notified of the appointment.  I have also left a message with the patient with her appointment change.

## 2017-07-17 ENCOUNTER — Encounter: Payer: Self-pay | Admitting: *Deleted

## 2017-07-17 ENCOUNTER — Inpatient Hospital Stay: Payer: Medicare Other | Attending: Hematology and Oncology | Admitting: Hematology and Oncology

## 2017-07-17 ENCOUNTER — Encounter: Payer: Self-pay | Admitting: Hematology and Oncology

## 2017-07-17 DIAGNOSIS — Z9223 Personal history of estrogen therapy: Secondary | ICD-10-CM

## 2017-07-17 DIAGNOSIS — M1990 Unspecified osteoarthritis, unspecified site: Secondary | ICD-10-CM | POA: Diagnosis not present

## 2017-07-17 DIAGNOSIS — C50412 Malignant neoplasm of upper-outer quadrant of left female breast: Secondary | ICD-10-CM | POA: Insufficient documentation

## 2017-07-17 DIAGNOSIS — Z79899 Other long term (current) drug therapy: Secondary | ICD-10-CM | POA: Diagnosis not present

## 2017-07-17 DIAGNOSIS — Z85828 Personal history of other malignant neoplasm of skin: Secondary | ICD-10-CM | POA: Diagnosis not present

## 2017-07-17 DIAGNOSIS — Z809 Family history of malignant neoplasm, unspecified: Secondary | ICD-10-CM | POA: Diagnosis not present

## 2017-07-17 DIAGNOSIS — I10 Essential (primary) hypertension: Secondary | ICD-10-CM

## 2017-07-17 DIAGNOSIS — Z923 Personal history of irradiation: Secondary | ICD-10-CM

## 2017-07-17 DIAGNOSIS — Z880 Allergy status to penicillin: Secondary | ICD-10-CM | POA: Diagnosis not present

## 2017-07-17 DIAGNOSIS — Z853 Personal history of malignant neoplasm of breast: Secondary | ICD-10-CM | POA: Insufficient documentation

## 2017-07-17 DIAGNOSIS — Z170 Estrogen receptor positive status [ER+]: Secondary | ICD-10-CM

## 2017-07-17 DIAGNOSIS — C773 Secondary and unspecified malignant neoplasm of axilla and upper limb lymph nodes: Secondary | ICD-10-CM | POA: Diagnosis not present

## 2017-07-17 DIAGNOSIS — Z803 Family history of malignant neoplasm of breast: Secondary | ICD-10-CM

## 2017-07-17 DIAGNOSIS — Z7982 Long term (current) use of aspirin: Secondary | ICD-10-CM | POA: Diagnosis not present

## 2017-07-17 NOTE — Progress Notes (Signed)
Patient former patient of Dr. Oliva Bustard for breast cancer of left breast.  Patient returns with recurrent breast cancer of left breast.  Referred by Dr. Army Melia.  Accompanine dby her daughter.

## 2017-07-17 NOTE — Progress Notes (Signed)
  Oncology Nurse Navigator Documentation  Navigator Location: [facility] (07/17/17 1600)   )Navigator Encounter Type: Initial MedOnc (07/17/17 1600)                     Patient Visit Type: MedOnc (07/17/17 1600) Treatment Phase: Pre-Tx/Tx Discussion (07/17/17 1600) Barriers/Navigation Needs: Education (07/17/17 1600) Education: Newly Diagnosed Cancer Education (07/17/17 1600)                        Time Spent with Patient: 75 (07/17/17 1600)   Met with patient and her daughter today during her initial medical oncology consult with Dr. Mike Gip.  She discussed option of mastectomy or lumpectomy with antihormonal therapy vs antihormonal therapy alone.  Patient is to decide what she wants to do and let Dr. Mike Gip and Dr. Bary Castilla know her decision.  She has also been scheduled for a bone density scan.  Gave patient breast cancer educational literature, "My Breast Cancer Treatment Handbook" by Josephine Igo, RN.   She is to call with any questions or needs.

## 2017-07-17 NOTE — Progress Notes (Signed)
[place] [facility] day:  07/17/2017  Chief Complaint: Kelli Williams is a 83 y.o. female with a history of stage I left breast cancer (2000) and clinical stage I left breast cancer s/p biopsy (2018) who is referred in consultation by Dr. Hervey Ard for assessment and management.  HPI:  The patient has a history of left breast cancer in 2000.  Mammogram in 05/1999 revealed an 8 mm lesion in the left upper outer quadrant.  She underwent wide excision and sentinel lymph node biopsy on 07/18/1999 by Dr. Sharman Crate at [facility].  Pathology revealed a 0.8 x 0.6 x 0.4 cm grade II invasive ductal adenocarcinoma.  There was no lymphovascular invasion.  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  Margins were negative.  Tumor was ER + (50%) and PR + (90%).  She received 6200 cGy  from 08/21/1999 - 10/03/1999.  She completed 5 years of tamoxifen in 06/2004.  She was initially followed at [facility] by Dr. Concepcion Elk.  She was last seen by Dr. Oliva Bustard on 02/06/2012.  Screening mammogram on 06/19/2017 revealed scattered areas of fibroglandular density.  In the left axillae, there was possible adenopathy. Left breast ultrasound on 07/01/2017 revealed a 1.3 x 1.1 x 1.0 cm left axillary lobulated mass.  She underwent left axillary biopsy on 07/10/2017.  Pathology revealed invasive mammary carcinoma with focal lobular features.  Tumor was grade II and measured 1.1 cm.  ER was + (> 90%), PR + (> 90%), and Her2/neu 2+.  She was seen by Dr. Bary Castilla on 07/15/2017.    Left breast ultrasound revealed a 0.54 x 0.57 x 0.95 cm hypoechoic area with shadowing likely associated with the mammographically identified calcification.  In the axilla was a mult-llobulated 0.9 x 1.16 x 1.18 cm mass which could represent a lymph node versus breast parenchyma (latter suspected). This appeared to be in the breast parenchyma.  There was no adjacent nodal disease.  CBC and CMP were normal on  06/27/2017.  Symptomatically, she notes that she is independent except for the use of her walker.  She notes that her left hip is "bone on bone" and her left knee needs replacement.  She was told that she was not a surgical candidate by Dr. Rudene Christians.  She underwent menarche at age 26.  She underwent menopause at age 83.  She was never on hormone replacement.   Past Medical History:  Diagnosis Date  . Breast cancer ([place]) 2000   left breast ca with lumpectomy and rad tx 5 yr tamoxifen/Dr Jeannine Kitten at [place]  . Hypertension   . Neuropathic pain of both legs   . Neuropathy   . Skin cancer of forehead   . Skin cancer of nose     Past Surgical History:  Procedure Laterality Date  . AXILLARY LYMPH NODE BIOPSY Left 07/10/2017   INVASIVE MAMMARY CARCINOMA WITH FOCAL LOBULAR FEATURES.   Marland Kitchen BREAST BIOPSY Bilateral 1960   benign  . BREAST LUMPECTOMY Left 2000   breast ca with rad tx  . CATARACT EXTRACTION     left eye  . CHOLECYSTECTOMY    . PARTIAL KNEE ARTHROPLASTY     right  . TOTAL VAGINAL HYSTERECTOMY      Family History  Problem Relation Age of Onset  . Heart disease Mother   . Heart attack Paternal Uncle   . Breast cancer Daughter 16       genetic negative  . Cancer Paternal Grandmother  Social History:  reports that she has never smoked. She has never used smokeless tobacco. She reports that she does not drink alcohol or use drugs.  The patient's husband died 2 years ago.  She was born outside of [place].  She previously worked as an Web designer.  She lives at the [place] at [place].  She is independent except for the use of her walker.  Her daughter is [name].  The patient is accompanied by her daughter today.  Allergies:  Allergies  Allergen Reactions  . Amitriptyline Hives  . Amoxicillin     Diarrhea    . Sulfa Antibiotics Rash  . Sulfa Antibiotics Rash    Current Medications: Current Outpatient Prescriptions  Medication Sig Dispense Refill  .  aspirin 81 MG tablet Take 1 tablet by mouth daily.    . B Complex-Folic Acid (BENFOTIAMINE MULTI-B) CAPS Take 1 tablet by mouth daily.    . Coenzyme Q10 (CO Q-10) 100 MG CAPS Take by mouth daily.    Marland Kitchen gabapentin (NEURONTIN) 300 MG capsule Take 3 capsules (900 mg total) by mouth 2 (two) times daily. 540 capsule 3  . ibuprofen (ADVIL,MOTRIN) 600 MG tablet Take 600 mg by mouth every 6 (six) hours as needed.    Marland Kitchen lisinopril-hydrochlorothiazide (PRINZIDE,ZESTORETIC) 20-25 MG tablet Take 1.5 tablets by mouth daily. 135 tablet 0  . meloxicam (MOBIC) 15 MG tablet Take 1 tablet (15 mg total) by mouth daily. 90 tablet 1  . Multiple Vitamins-Minerals (MULTIVITAMIN WITH MINERALS) tablet Take 1 tablet by mouth daily.    Marland Kitchen omeprazole (PRILOSEC) 20 MG capsule Take 1 capsule (20 mg total) by mouth daily. 90 capsule 1  . propranolol (INDERAL) 10 MG tablet Take 1 tablet by mouth up  to 3 times daily as needed 270 tablet 3  . Calcium-Magnesium 500-250 MG TABS Take by mouth daily.    . Cholecalciferol 1000 UNITS capsule Take 1 capsule by mouth daily.    . DOCOSAHEXAENOIC ACID PO Take by mouth.    . montelukast (SINGULAIR) 10 MG tablet     . Omega-3 Fatty Acids (FISH OIL CONCENTRATE) 300 MG CAPS Take by mouth.    . thiamine 100 MG tablet Take by mouth.     No current facility-administered medications for this visit.     Review of Systems:  GENERAL:  Feels good.  No fevers or sweats.  Weight gain. PERFORMANCE STATUS (ECOG):  2 HEENT:  No visual changes, runny nose, sore throat, mouth sores or tenderness. Lungs: No shortness of breath or cough.  No hemoptysis. Cardiac:  No chest pain, palpitations, orthopnea, or PND. GI:  Irritable bowel exacerbated by nerves.  No nausea, vomiting,constipation, melena or hematochezia. GU:  No urgency, frequency, dysuria, or hematuria. Musculoskeletal:  Left hip and knee problems (see HPI).  Right middle finger arthritis.  No muscle tenderness. Extremities:  No pain or  swelling. Skin:  No rashes or skin changes. Neuro:  No headache, numbness or weakness, balance or coordination issues. Endocrine:  No diabetes, thyroid issues, hot flashes or night sweats. Psych:  No mood changes, depression or anxiety. Pain:  No focal pain. Review of systems:  All other systems reviewed and found to be negative.  Physical Exam: Blood pressure (!) 186/80, pulse (!) 52, temperature 98.1 F (36.7 C), temperature source Tympanic, resp. rate 18, height '5\' 4"'$  (1.626 m), weight 200 lb (90.7 kg). GENERAL:  Well developed, well nourished, woman sitting comfortably in the exam room in no acute distress.  She has  a walker by her side. MENTAL STATUS:  Alert and oriented to person, place and time. HEAD:  Pearline Cables hair.  Normocephalic, atraumatic, face symmetric, no Cushingoid features. EYES:  Hazel eyes.  Pupils equal round and reactive to light and accomodation.  No conjunctivitis or scleral icterus. ENT:  Oropharynx clear without lesion.  Tongue normal. Mucous membranes moist.  RESPIRATORY:  Clear to auscultation without rales, wheezes or rhonchi. CARDIOVASCULAR:  Regular rate and rhythm without murmur, rub or gallop. BREAST:  Right breast without masses, skin changes or nipple discharge.  Left breast s/p lumpectomy with post biopsy bruising.  Fullness but no discrete masses, skin changes or nipple discharge.  ABDOMEN:  Soft, non-tender, with active bowel sounds, and no hepatosplenomegaly.  No masses. SKIN:  Nose s/p Moh's surgery.  No rashes, ulcers or lesions. EXTREMITIES: Chronic lower extremity changes.  No skin discoloration or tenderness.  No palpable cords. LYMPH NODES: No palpable cervical, supraclavicular, axillary or inguinal adenopathy  NEUROLOGICAL: Unremarkable. PSYCH:  Appropriate.   No visits with results within 3 Day(s) from this visit.  Latest known visit with results is:  Hospital Outpatient Visit on 07/10/2017  Component Date Value Ref Range Status  . SURGICAL  PATHOLOGY 07/10/2017    Final-Edited                   Value:Surgical Pathology THIS IS AN ADDENDUM REPORT CASE: [id number] PATIENT: [facility] Surgical Pathology Report Addendum   Reason for Addendum #1:  Additional clinical/test information  SPECIMEN SUBMITTED: A. Axilla, left  CLINICAL HISTORY: Left axillary mass in a patient with history of prior ipsilateral breast cancer  PRE-OPERATIVE DIAGNOSIS: Metastatic disease vs second primary cancer  POST-OPERATIVE DIAGNOSIS: None provided.     DIAGNOSIS: A.  LEFT AXILLA; ULTRASOUND-GUIDED NEEDLE CORE BIOPSY: - INVASIVE MAMMARY CARCINOMA WITH FOCAL LOBULAR FEATURES.  Size of invasive carcinoma:  1.1 cm in this sample Histologic grade of invasive carcinoma: Grade 2      Glandular/tubular differentiation score: 3      Nuclear pleomorphism score: 2      Mitotic rate score: 1  ER/PR/HER2: Immunohistochemistry will be performed, with reflex to [place] for HER2 2+ (equivocal) results. The results will be reported in an addendum.  The                          diagnosis was called to Eino Farber of [facility] on 07/11/17 at 227 PM.   GROSS DESCRIPTION:  A. The specimen is received in a formalin-filled container labeled with the patient's name and left axillary mass.  Core pieces: multiple, 2.0 x 0.9 x 0.1 cm Comments: yellow to red lobulated fibrofatty, marked green  Entirely submitted in cassette(s): 1  Time/Date in fixative: placed in formalin at 1:50 PM on 07/10/17 cold ischemic time of less than 5 minutes Total fixation time: 6.5 hours    Final Diagnosis performed by Delorse Lek, MD.  Electronically signed 07/11/2017 2:29:14PM   The electronic signature indicates that the named Attending Pathologist has evaluated the specimen  Technical component performed at [place], [address], [place], [place] [address] Lab: [phone] Dir: Darrick Penna. Evette Doffing, MD  Professional component performed  at [facility], [facility], [place], [place], [place] [address] Lab: [phone]                          Dir: Dellia Nims. Reuel Derby, MD   Breast Biomarker  Reporting Template  BREAST BIOMARKER TESTS Estrogen Receptor (ER) Status: POSITIVE, >90% nuclear staining      Average intensity of staining: Strong Progesterone Receptor (PgR) Status: POSITIVE, >90% nuclear staining      Average intensity of staining: Strong HER2 (by immunohistochemistry): EQUIVOCAL, 2+ Percentage of cells with uniform intense complete membrane staining: 0%  METHODS Cold Ischemia and Fixation Times: Meet requirements specified in latest version of the ASCO/CAP guidelines Testing Performed on Block Number(s): A1 Fixative: Formalin Estrogen Receptor:  FDA cleared ([place])                    Primary Antibody:  SP1 Progesterone Receptor: FDA cleared ([place])                   Primary Antibody: 1E2 HER2 (by immunohistochemistry): FDA approved ([place])                             Primary Antibody: HercepTest  Immunohistochemistry controls worked appropriately. Slides were prepared by [facility] for [facility], [place], [place], and interpreted by Dr. Luana Shu.      Addendum #1 performed by Delorse Lek, MD.  Electronically signed 07/15/2017 1:36:03PM    Technical component performed at [place], [address] [place], [place] [address] Lab: [phone] Dir: Darrick Penna. Evette Doffing, MD  Professional component performed at [facility], [facility], [place], [place], [place] [address] Lab: [phone] Dir: Dellia Nims. Rubinas, MD      Assessment:  Kelli Williams is a 83 y.o. female with a history of stage I left breast cancer (2000) and a clinical T1cN0 left breast cancer (2018) s/p biopsy revealing invasive mammary carcinoma with focal lobular features.   Biopsy was grade II and measured 1.1 cm.  Tumor was ER + (> 90%), PR + (>  90%), and Her2/neu 2+.  She has a history of left breast cancer s/p wide excision and sentinel lymph node biopsy on 07/18/1999 at [facility].  Pathology revealed a 0.8 x 0.6 x 0.4 cm grade II invasive ductal adenocarcinoma.  There was no lymphovascular invasion.  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  Margins were negative.  Tumor was ER + (50%) and PR + (90%).  Pathologic stage was T1bN0.  She received 6200 cGy from 08/21/1999 - 10/03/1999.  She completed 5 years of tamoxifen in 06/2004.    Screening mammogram on 06/19/2017 revealed scattered areas of fibroglandular density.  In the left axillae, there was possible adenopathy.  Left breast ultrasound on 07/01/2017 revealed a 1.3 x 1.1 x 1.0 cm left axillary lobulated mass.  Symptomatically, she notes chronic left hip and knee issues.  She was told that she was not a surgical candidate by Dr. Rudene Christians.   Plan: 1.  Discuss diagnosis, staging, and management of breast cancer.  Discuss prior history of stage I breast cancer 18 years ago.  Discuss recent biopsy revealing new left breast cancer.  Suspect this is a second primary.  Lesion likely in breast rather than lymph node (plan to discuss with Dr. Delorse Lek, pathologist).  She has a clinical stage I (T1cN0) breast cancer.  Tumor is ER/PR+.  Her2/neu is 2+.  Await FISH results.  Discuss the difference in management between Her2/neu + and Her2/neu - disease.  Discuss the  use of hormonal therapy x 5 years.  Discuss aromatase inhibitors and potential side effects.  Discuss obtaining a baseline bone density.  2.  Discuss surgical considerations.  Discuss standard of care is mastectomy.  Discuss other options including lumpectomy and hormonal therapy alone given her age.  She stated that she was scared of surgery.  We discussed hormonal therapy following surgery (lumpectomy or mastectomy).  We discussed no radiation if lumpectomy chosen as she has already received whole breast radiation.  We discussed  follow-up if she chose no surgery.  She would need periodic ultrasound assessment.  We discussed standard follow-up post surgery while on hormonal therapy.  At the end of our long discussion, she was leaning toward lumpectomy.  She state that she would contact Dr Bary Castilla and myself once she decided.  3.  Follow-up Her2/neu testing by FISH. 4.  Discuss pathology with Dr. Delorse Lek- done.  Tumor appears to arise in breast tissue rather than a lymph node. 5.  Bone density study. 6.  Patient to call for return appointment.   Lequita Asal, MD  07/17/2017, 3:39 PM

## 2017-07-18 ENCOUNTER — Inpatient Hospital Stay: Payer: Medicare Other | Admitting: Oncology

## 2017-07-18 LAB — SURGICAL PATHOLOGY

## 2017-07-21 ENCOUNTER — Encounter: Payer: Self-pay | Admitting: Body Imaging

## 2017-07-24 ENCOUNTER — Encounter: Payer: Self-pay | Admitting: *Deleted

## 2017-07-24 NOTE — Progress Notes (Signed)
  Oncology Nurse Navigator Documentation  Navigator Location: [facility] Onc (07/24/17 1100)   )Navigator Encounter Type: Telephone (07/24/17 1100) Telephone: Lahoma Crocker Call (07/24/17 1100)                       Barriers/Navigation Needs: Education;Coordination of Care (07/24/17 1100)                          Time Spent with Patient: 30 (07/24/17 1100)   Called patient to follow up with any questions or needs she may have.  States she has decided to have a "lumpectomy".   She thought she had to wait until after her bone density test to have surgery.  Explained that Dr. Mike Gip had told her the bone denisty test would not have anything to do with the type of surgery, or when her surgery would be.  States she has not informed Dr. Bary Castilla or Dr. Mike Gip of her decision.  Informed her I would let them both know her decision, and she should hear from Dr. Dwyane Luo office.  She is to call with any questions or needs.  She is agreeable.

## 2017-08-01 ENCOUNTER — Telehealth: Payer: Self-pay

## 2017-08-01 NOTE — Telephone Encounter (Signed)
Called to get Dr. Army Melia recommendation on Breast Cancer.Monday with Dr.Burnette. Should she get Lumpectomy or Mastectomy?   Per Dr.Berglund we do not do recommendations at all when seeing a specialist and usually send to a specialist either way.

## 2017-08-04 ENCOUNTER — Ambulatory Visit (INDEPENDENT_AMBULATORY_CARE_PROVIDER_SITE_OTHER): Payer: Medicare Other | Admitting: General Surgery

## 2017-08-04 ENCOUNTER — Encounter: Payer: Self-pay | Admitting: General Surgery

## 2017-08-04 VITALS — BP 142/80 | HR 78 | Resp 14 | Ht 66.0 in | Wt 194.0 lb

## 2017-08-04 DIAGNOSIS — C50412 Malignant neoplasm of upper-outer quadrant of left female breast: Secondary | ICD-10-CM | POA: Diagnosis not present

## 2017-08-04 DIAGNOSIS — Z170 Estrogen receptor positive status [ER+]: Secondary | ICD-10-CM

## 2017-08-04 NOTE — Patient Instructions (Signed)
Lumpectomy A lumpectomy, sometimes called a partial mastectomy, is surgery to remove a cancerous tumor or mass (the lump) from a breast. It is a form of "breast conserving" or "breast preservation" surgery. This means that the cancerous tissue is removed but the breast remains intact. During a lumpectomy, the portion of the breast that contains the tumor is removed. Some normal tissue around the lump may be taken out to make sure that all of the tumor has been removed. Lymph nodes under your arm may also be removed and tested to find out if the cancer has spread. Tell a health care provider about:  Any allergies you have.  All medicines you are taking, including vitamins, herbs, eye drops, creams, and over-the-counter medicines.  Any problems you or family members have had with anesthetic medicines.  Any blood disorders you have.  Any surgeries you have had.  Any medical conditions you have.  Whether you are pregnant or may be pregnant. What are the risks? Generally, this is a safe procedure. However, problems may occur, including:  Bleeding.  Infection.  Pain, swelling, weakness, or numbness in the arm on the side of your surgery.  Temporary swelling.  Change in the shape of the breast, particularly if a large portion is removed.  Scar tissue that forms at the surgical site and feels hard to the touch.  What happens before the procedure? Staying hydrated Follow instructions from your health care provider about hydration, which may include:  Up to 2 hours before the procedure - you may continue to drink clear liquids, such as water, clear fruit juice, black coffee, and plain tea.  Eating and drinking restrictions  Follow instructions from your health care provider about eating and drinking, which may include: ? 8 hours before the procedure - stop eating heavy meals or foods such as meat, fried foods, or fatty foods. ? 6 hours before the procedure - stop eating light meals  or foods, such as toast or cereal. ? 6 hours before the procedure - stop drinking milk or drinks that contain milk. ? 2 hours before the procedure - stop drinking clear liquids. Medicines  Ask your health care provider about: ? Changing or stopping your regular medicines. This is especially important if you are taking diabetes medicines or blood thinners. ? Taking medicines such as aspirin and ibuprofen. These medicines can thin your blood. Do not take these medicines before your procedure if your health care provider instructs you not to.  You may be given antibiotic medicine to help prevent infection. General instructions  Ask your health care provider how your surgical site will be marked or identified.  You may be screened for extra fluid around the lymph nodes (lymphedema).  Plan to have someone take you home from the hospital or clinic.  On the day of surgery, your health care provider will use a mammogram or ultrasound to locate and mark the tumor in your breast. These markings on your breast will show where the incision will be made. What happens during the procedure?  To lower your risk of infection: ? Your health care team will wash or sanitize their hands. ? Your skin will be washed with soap.  An IV tube will be put into one of your veins.  You will be given one or more of the following: ? A medicine to help you relax (sedative). ? A medicine to numb the area (local anesthetic). ? A medicine to make you fall asleep (general anesthetic).  Your health  care provider will use a kind of electric scalpel that uses heat to minimize bleeding (electrocautery knife). A curved incision that follows the natural curve of your breast will be made. This type of incision will allow for minimal scarring and better healing.  The tumor will be removed along with some of the surrounding tissue. This will be sent to the lab for testing. Your health care provider may also remove lymph nodes  at this time if needed.  A small drain tube may be inserted into your breast area or armpit to collect fluid that may build up after surgery. This tube will be connected to a suction bulb on the outside of your body to remove the fluid.  The incision will be closed with stitches (sutures).  A bandage (dressing) may be placed over the incision. The procedure may vary among health care providers and hospitals. What happens after the procedure?  Your blood pressure, heart rate, breathing rate, and blood oxygen level will be monitored until the medicines you were given have worn off.  You will be given medicine for pain.  You may have a drain tube in place for 2-3 days to prevent a collection of blood (hematoma) from developing in the breast. You will be given instructions about caring for the drain before you go home.  A pressure bandage may be applied for 1-2 days to prevent bleeding or swelling. Your pressure bandage may look like a thick piece of fabric or an elastic wrap. Ask your health care provider how to care for your bandage at home.  You may be given a tight sleeve to wear over your arm on the side of your surgery. You should wear this sleeve as told by your health care provider.  Do not drive for 24 hours if you were given a sedative. This information is not intended to replace advice given to you by your health care provider. Make sure you discuss any questions you have with your health care provider. Document Released: 01/06/2007 Document Revised: 08/08/2016 Document Reviewed: 08/07/2016 Elsevier Interactive Patient Education  2018 [employer].

## 2017-08-04 NOTE — Progress Notes (Signed)
Patient ID: Kelli Williams, female   DOB: 1926-10-30, 83 y.o.   MRN: [id number]  Chief Complaint  Patient presents with  . Breast Cancer    HPI Kelli Williams is a 83 y.o. female is here today to discuss left breast surgery. Patient states she is still tender in the biopsy area.  HPI  Past Medical History:  Diagnosis Date  . Breast cancer ([place]) 2000   left breast ca with lumpectomy and rad tx 5 yr tamoxifen/Dr Jeannine Kitten at [facility]  . Hypertension   . Neuropathic pain of both legs   . Neuropathy   . Skin cancer of forehead   . Skin cancer of nose     Past Surgical History:  Procedure Laterality Date  . AXILLARY LYMPH NODE BIOPSY Left 07/10/2017   INVASIVE MAMMARY CARCINOMA WITH FOCAL LOBULAR FEATURES.   Marland Kitchen BREAST BIOPSY Bilateral 1960   benign  . BREAST LUMPECTOMY Left 2000   breast ca with rad tx  . CATARACT EXTRACTION     left eye  . CHOLECYSTECTOMY    . PARTIAL KNEE ARTHROPLASTY     right  . TOTAL VAGINAL HYSTERECTOMY      Family History  Problem Relation Age of Onset  . Heart disease Mother   . Heart attack Paternal Uncle   . Breast cancer Daughter 73       genetic negative  . Cancer Paternal Grandmother     Social History Social History  Substance Use Topics  . Smoking status: Never Smoker  . Smokeless tobacco: Never Used  . Alcohol use No    Allergies  Allergen Reactions  . Amitriptyline Hives  . Amoxicillin     Diarrhea    . Sulfa Antibiotics Rash  . Sulfa Antibiotics Rash    Current Outpatient Prescriptions  Medication Sig Dispense Refill  . Calcium-Magnesium 500-250 MG TABS Take by mouth daily.    . Coenzyme Q10 (CO Q-10) 100 MG CAPS Take by mouth daily.    Marland Kitchen gabapentin (NEURONTIN) 300 MG capsule Take 3 capsules (900 mg total) by mouth 2 (two) times daily. 540 capsule 3  . ibuprofen (ADVIL,MOTRIN) 600 MG tablet Take 600 mg by mouth every 6 (six) hours as needed.    Marland Kitchen lisinopril-hydrochlorothiazide (PRINZIDE,ZESTORETIC) 20-25 MG tablet Take 1.5  tablets by mouth daily. 135 tablet 0  . meloxicam (MOBIC) 15 MG tablet Take 1 tablet (15 mg total) by mouth daily. 90 tablet 1  . Multiple Vitamins-Minerals (MULTIVITAMIN WITH MINERALS) tablet Take 1 tablet by mouth daily.    . Omega-3 Fatty Acids (FISH OIL CONCENTRATE) 300 MG CAPS Take by mouth.    Marland Kitchen omeprazole (PRILOSEC) 20 MG capsule Take 1 capsule (20 mg total) by mouth daily. 90 capsule 1  . propranolol (INDERAL) 10 MG tablet Take 1 tablet by mouth up  to 3 times daily as needed 270 tablet 3  . aspirin 81 MG tablet Take 1 tablet by mouth daily.    . B Complex-Folic Acid (BENFOTIAMINE MULTI-B) CAPS Take 1 tablet by mouth daily.    . Cholecalciferol 1000 UNITS capsule Take 1 capsule by mouth daily.    . DOCOSAHEXAENOIC ACID PO Take by mouth.    . montelukast (SINGULAIR) 10 MG tablet     . thiamine 100 MG tablet Take by mouth.     No current facility-administered medications for this visit.     Review of Systems Review of Systems  Constitutional: Negative.   Respiratory: Negative.   Cardiovascular: Negative.  Blood pressure (!) 142/80, pulse 78, resp. rate 14, height 5\' 6"  (1.676 m), weight 194 lb (88 kg).  Physical Exam Physical Exam  Constitutional: She is oriented to person, place, and time. She appears well-developed and well-nourished.  Eyes: Conjunctivae are normal. No scleral icterus.  Neck: Neck supple.  Cardiovascular: Normal rate, regular rhythm and normal heart sounds.   Pulmonary/Chest: Effort normal and breath sounds normal. Left breast exhibits tenderness. Left breast exhibits no inverted nipple, no mass, no nipple discharge and no skin change.    Lymphadenopathy:    She has no cervical adenopathy.    She has no axillary adenopathy.  Neurological: She is alert and oriented to person, place, and time.  Skin: Skin is warm and dry.    Data Reviewed Case reviewed at the [facility] tumor board.  Assessment    Recurrent left breast cancer.     Plan          Discussed and reviewed options in detail with patient: observation, hormone receptor pill, or left breast lumpectomy versus mastectomy.  There is still lack of clarity of whether this is a no metastasis or a second primary in the axillary tail. At this time, we'll plan to resect the axillary tail and work from that point. If it is indeed a no metastasis (no nodal tissue on the core biopsy) considerations at that time would be for hormone therapy alone versus scanning of the left breast looking for occult primary versus mastectomy.  Patient aware of benefits and risks.    Patient wishes to proceed with left breast lumpectomy. Procedure was discussed in detail.  HPI, Physical Exam, Assessment and Plan have been scribed under the direction and in the presence of Hervey Ard, MD.  [name], CMA  I have completed the exam and reviewed the above documentation for accuracy and completeness.  I agree with the above.  Haematologist has been used and any errors in dictation or transcription are unintentional.  Hervey Ard, M.D., F.A.C.S.   Robert Bellow 08/05/2017, 9:37 AM  Patient was given possible dates for surgery. She will check with her transportation and let us know when she would like to proceed. Surgery instructions reviewed and provided to the patient today. It is okay for patient to continue an 81 mg aspirin once daily.   Dominga Ferry, CMA

## 2017-08-05 ENCOUNTER — Telehealth: Payer: Self-pay

## 2017-08-05 NOTE — Telephone Encounter (Signed)
Patient called to schedule her surgery. The patient is scheduled for surgery at [facility] on 08/20/17. She will pre admit by phone. She is aware of date, and instructions.

## 2017-08-13 ENCOUNTER — Encounter
Admission: RE | Admit: 2017-08-13 | Discharge: 2017-08-13 | Disposition: A | Discharge status: Home / Self Care | Payer: Medicare Other | Source: Ambulatory Visit | Attending: General Surgery | Admitting: General Surgery

## 2017-08-13 HISTORY — DX: Anxiety disorder, unspecified: F41.9

## 2017-08-13 HISTORY — DX: Cardiac arrhythmia, unspecified: I49.9

## 2017-08-13 HISTORY — DX: Unspecified osteoarthritis, unspecified site: M19.90

## 2017-08-13 HISTORY — DX: Adverse effect of unspecified anesthetic, initial encounter: T41.45XA

## 2017-08-13 HISTORY — DX: Other complications of anesthesia, initial encounter: T88.59XA

## 2017-08-13 HISTORY — DX: Pneumonia, unspecified organism: J18.9

## 2017-08-13 HISTORY — DX: Gastro-esophageal reflux disease without esophagitis: K21.9

## 2017-08-13 NOTE — Pre-Procedure Instructions (Signed)
Notes Encounter Date: 01/09/2017 Kelli Merritts, MD  Cardiology  Expand All Collapse All   [] Hide copied text Cardiology Office Note  Date:  01/09/2017   ID:  Kelli Williams, DOB 1926/04/28, MRN [id number]  PCP:  Kelli Maidens, MD       Chief Complaint  Patient presents with  . other    12 month follow up. Meds reviewed by the pt. verbally.     HPI:  Kelli Williams is a very pleasant 83 year old woman with no known coronary artery disease or cardiac issues who presents for routine followup of her tachycardia/palpitations Status post radiation treatment for breast cancer in 2000 on the left with 7 weeks of radiation on a daily basis.  She lost her husband in March 2016. Still with significant adjustment disorder/depression last echo 2011: normal EF 60%,  She lives at the [place].  finished PT, was not happy with their service She has a back brace coming in the mail Having severe pain left knee Was told she was too old for total knee replacement  Otherwise has rare palpitations at nighttime, takes propranolol as needed Scant swelling lower extremities, does not want to wear compression hose Blood pressure well controlled at home No regular exercise, weight stable 196 pounds  Previously reported having Chronic pain in her foot following a Fracture, walks with a cane  EKG on today's visit shows normal sinus rhythm with rate 59 bpm, no significant ST or T-wave changes  Other past medical history She did have stress test in 2012 at the recommendation of her cardiologist at [facility] She reports having a previous fracture in her right foot, seen by podiatry and Dr. Marry Williams  Lab work from September 2013 shows total cholesterol 167, LDL 91  PMH:   has a past medical history of Cancer ([place]); Hypertension; Neuropathic pain of both legs; Neuropathy ([place]); Skin cancer of forehead; and Skin cancer of nose.  PSH:         Past Surgical History:  Procedure  Laterality Date  . ABDOMINAL HYSTERECTOMY    . BREAST LUMPECTOMY     left  . BREAST LUMPECTOMY    . CATARACT EXTRACTION     left eye  . GALLBLADDER SURGERY    . PARTIAL KNEE ARTHROPLASTY     right  . right knee replacement    . TOTAL VAGINAL HYSTERECTOMY            Current Outpatient Prescriptions  Medication Sig Dispense Refill  . aspirin 81 MG tablet Take 1 tablet by mouth daily.    . B Complex-Folic Acid (BENFOTIAMINE MULTI-B) CAPS Take 1 tablet by mouth daily.    . Calcium-Magnesium 500-250 MG TABS Take by mouth daily.    . Cholecalciferol 1000 UNITS capsule Take 1 capsule by mouth daily.    . Coenzyme Q10 (CO Q-10) 100 MG CAPS Take by mouth daily.    Marland Kitchen gabapentin (NEURONTIN) 300 MG capsule Take 3 capsules (900 mg total) by mouth 2 (two) times daily. 540 capsule 3  . ibuprofen (ADVIL,MOTRIN) 600 MG tablet Take 600 mg by mouth every 6 (six) hours as needed.    Marland Kitchen lisinopril-hydrochlorothiazide (PRINZIDE,ZESTORETIC) 20-25 MG tablet Take 1.5 tablets by mouth daily. 135 tablet 3  . Multiple Vitamins-Minerals (MULTIVITAMIN WITH MINERALS) tablet Take 1 tablet by mouth daily.    . Omega-3 Fatty Acids (FISH OIL CONCENTRATE) 300 MG CAPS Take by mouth.    . propranolol (INDERAL) 10 MG tablet Take 1 tablet by mouth  up  to 3 times daily as needed 270 tablet 3   No current facility-administered medications for this visit.      Allergies:   Amitriptyline; Amoxicillin; Sulfa antibiotics; and Sulfa antibiotics   Social History:  The patient  reports that she has never smoked. She has never used smokeless tobacco. She reports that she does not drink alcohol or use drugs.   Family History:   family history includes Heart attack in her paternal uncle; Heart disease in her mother.    Review of Systems: Review of Systems  Respiratory: Negative.   Cardiovascular: Positive for leg swelling.  Gastrointestinal: Negative.   Musculoskeletal: Positive  for joint pain.       Difficulty walking  Neurological: Positive for weakness.  Psychiatric/Behavioral: Negative.   All other systems reviewed and are negative.    PHYSICAL EXAM: VS:  BP 140/84 (BP Location: Right Arm, Patient Position: Sitting, Cuff Size: Small)   Pulse 61   Ht 5\' 5"  (1.651 m)   Wt 196 lb 4 oz (89 kg)   BMI 32.66 kg/m  , BMI Body mass index is 32.66 kg/m. GEN: Well nourished, well developed, in no acute distress, obese  HEENT: normal  Neck: no JVD, carotid bruits, or masses Cardiac: RRR; no murmurs, rubs, or gallops,no edema  Respiratory:  clear to auscultation bilaterally, normal work of breathing GI: soft, nontender, nondistended, + BS MS: no deformity or atrophy  Skin: warm and dry, no rash Neuro:  Strength and sensation are intact Psych: euthymic mood, full affect    Recent Labs: 06/19/2016: ALT 14; BUN 18; Creatinine, Ser 0.69; Platelets 260; Potassium 4.0; Sodium 143; TSH 2.190    Lipid Panel RecentLabs       Lab Results  Component Value Date   CHOL 170 06/19/2016   HDL 67 06/19/2016   LDLCALC 86 06/19/2016   TRIG 85 06/19/2016           Wt Readings from Last 3 Encounters:  01/09/17 196 lb 4 oz (89 kg)  12/30/16 201 lb (91.2 kg)  10/16/16 196 lb (88.9 kg)       ASSESSMENT AND PLAN:  Essential hypertension - Plan: EKG 12-Lead Blood pressure is well controlled on today's visit. No changes made to the medications.  Atrial tachycardia ([place]) - Plan: EKG 12-Lead She'll continue to take propranolol as needed, having rare symptoms of palpitations  Adjustment disorder with other symptom - Plan: EKG 12-Lead Difficulty adjusting after loss of her husband Discussed with her, recommended a regular exercise regimen Engage in social activities  Leg swelling - Plan: EKG 12-Lead Leg swelling likely from venous insufficiency, no evidence of pitting edema concerning for CHF She does not want compression hose at this  time  Long discussion about preoperative risk for total knee replacement on the left I feel she would be acceptable candidate for knee replacement surgery No further testing would be needed  Total encounter time more than 25 minutes  Greater than 50% was spent in counseling and coordination of care with the patient   Disposition:   F/U  12 months      Orders Placed This Encounter  Procedures  . EKG 12-Lead     Signed, Esmond Plants, M.D., Ph.D. 01/09/2017  [place] [phone]     Electronically signed by Kelli Merritts, MD at 01/09/2017 11:14 AM      Office Visit on 01/09/2017        Detailed Report

## 2017-08-13 NOTE — Patient Instructions (Addendum)
Your procedure is scheduled on: 08-20-17 [facility] Report to Same Day Surgery 2nd floor medical mall [facility] Entrance-take elevator on left to 2nd floor.  Check in with surgery information desk.) To find out your arrival time please call [phone] between 1PM - 3PM on 08-19-17 TUESDAY  Remember: Instructions that are not followed completely may result in serious medical risk, up to and including death, or upon the discretion of your surgeon and anesthesiologist your surgery may need to be rescheduled.    _x___ 1. Do not eat food after midnight the night before your procedure. NO GUM CHEWING OR HARD CANDIES.  You may drink clear liquids up to 2 hours before you are scheduled to arrive at the hospital for your procedure.  Do not drink clear liquids within 2 hours of your scheduled arrival to the hospital.  Clear liquids include  --Water or Apple juice without pulp  --Clear carbohydrate beverage such as ClearFast or Gatorade  --Black Coffee or Clear Tea (No milk, no creamers, do not add anything to the coffee or Tea) Type 1 and type 2 diabetics should only drink water.      __x__ 2. No Alcohol for 24 hours before or after surgery.   __x__3. No Smoking for 24 prior to surgery.   ____  4. Bring all medications with you on the day of surgery if instructed.    __x__ 5. Notify your doctor if there is any change in your medical condition     (cold, fever, infections).     Do not wear jewelry, make-up, hairpins, clips or nail polish.  Do not wear lotions, powders, or perfumes. You may wear deodorant.  Do not shave 48 hours prior to surgery. Men may shave face and neck.  Do not bring valuables to the hospital.    [facility] is not responsible for any belongings or valuables.               Contacts, dentures or bridgework may not be worn into surgery.  Leave your suitcase in the car. After surgery it may be brought to your room.  For patients admitted to the hospital, discharge time  is determined by your treatment team.   Patients discharged the day of surgery will not be allowed to drive home.  You will need someone to drive you home and stay with you the night of your procedure.    Please read over the following fact sheets that you were given:   [facility] Preparing for Surgery and or MRSA Information   _x___ Take anti-hypertensive listed below, cardiac, seizure, asthma,     anti-reflux and psychiatric medicines. These include:  1. GABAPENTIN (NEURONTIN)  2. PRILOSEC (OMEPRAZOLE)  3. TAKE AN EXTRA PRILOSEC Tuesday NIGHT BEFORE BED  4. BRING YOUR PROPRANOLOL BOTTLE WITH YOU TO [place] Wednesday   5.  6.  ____Fleets enema or Magnesium Citrate as directed.   _x___ Use CHG Soap or sage wipes as directed on instruction sheet   ____ Use inhalers on the day of surgery and bring to hospital day of surgery  ____ Stop Metformin and Janumet 2 days prior to surgery.    ____ Take 1/2 of usual insulin dose the night before surgery and none on the morning surgery.   _x___ Follow recommendations from Cardiologist, Pulmonologist or PCP regarding stopping Aspirin, Coumadin, Plavix ,Eliquis, Effient, or Pradaxa, and Pletal-OK TO CONTINUE 81 MG ASPIRIN-DO NOT TAKE AM OF SURGERY ____Stop Anti-inflammatories such as Advil, Aleve, Ibuprofen, Motrin,  Naproxen, Naprosyn, Goodies powders or aspirin products OK to take Tylenol    _x___ Stop supplements until after surgery-STOP CO-Q 10 NOW-MAY RESUME AFTER SURGERY   ____ Bring C-Pap to the hospital.

## 2017-08-14 ENCOUNTER — Ambulatory Visit
Admission: RE | Admit: 2017-08-14 | Discharge: 2017-08-14 | Disposition: A | Discharge status: Home / Self Care | Payer: Medicare Other | Source: Ambulatory Visit | Attending: General Surgery | Admitting: General Surgery

## 2017-08-14 ENCOUNTER — Telehealth: Payer: Self-pay | Admitting: Cardiovascular Disease

## 2017-08-14 ENCOUNTER — Encounter: Payer: Self-pay | Admitting: *Deleted

## 2017-08-14 DIAGNOSIS — Z01812 Encounter for preprocedural laboratory examination: Secondary | ICD-10-CM | POA: Insufficient documentation

## 2017-08-14 DIAGNOSIS — Z01810 Encounter for preprocedural cardiovascular examination: Secondary | ICD-10-CM | POA: Diagnosis not present

## 2017-08-14 DIAGNOSIS — I10 Essential (primary) hypertension: Secondary | ICD-10-CM | POA: Insufficient documentation

## 2017-08-14 DIAGNOSIS — I493 Ventricular premature depolarization: Secondary | ICD-10-CM | POA: Insufficient documentation

## 2017-08-14 LAB — POTASSIUM: Potassium: 3.5 mmol/L (ref 3.5–5.1)

## 2017-08-14 NOTE — Pre-Procedure Instructions (Signed)
CALLED MICHELE AT [facility] AND NOTIFIED HER THAT DR Park Meo CARDIAC CLEARANCE-FAXED CLEARANCE REQUEST OVER TO DR BYRNETTS OFFICE AND DR Gwenyth Ober OFFICE WITH FAX CONFIRMATION RECEIVED FROM BOTH OFFICES.

## 2017-08-14 NOTE — Telephone Encounter (Signed)
acceptrable risk for surgery No further testing needed

## 2017-08-14 NOTE — Progress Notes (Unsigned)
Kelli Williams from [facility] called the office stating that Kelli Williams is requesting cardiac clearance prior to patient's left breast wide excision which is scheduled for 08-20-17. Per Kelli Williams, there was a change in EKG from previous EKG done in February 2018.   Patient has seen Kelli Williams in the past.   I contacted Kelli Williams at Kelli Williams office and she will send a message to the nurse to see if patient will require a visit for cardiac clearance and will call our office back with status.

## 2017-08-14 NOTE — Telephone Encounter (Signed)
Needs surgical clearance. Please call. Surgery is 9/12

## 2017-08-14 NOTE — Pre-Procedure Instructions (Signed)
CALLED DR Kayleen Memos REGARDING EKG NOW SHOWING SEPTAL INFARCT-DR CARROLL LOOKED IN EPIC AT EKG AND COMPARED TODAYS EKG TO THE ONE DONE BY DR Rockey Situ ON FEB. 2018.  DR Rozanna Box THERE WERE CHANGES IN THIS EKG- CARDIAC CLEARANCE REQUESTED

## 2017-08-14 NOTE — Telephone Encounter (Signed)
Patient needs cardiac clearance for upcoming procedure on 08/20/17. Called and spoke with patient and she denies any changes since her last visit on 01/09/17. She denies any chest pain, denies shortness of breath, and has only had occasional increased beats which she takes the propranolol for and that helps it. They did EKG today in pre-op which is scanned and listed in her chart. Let her know that I would route clearance request to Dr. Rockey Situ and if we needed anything else we would be in touch. She was appreciative for the call with no further questions at this time.

## 2017-08-14 NOTE — Telephone Encounter (Signed)
Spoke with Selinda Eon from surgeon's office and she is requesting clearance.   Dominga Ferry, CMA    Heather from Pre[facility]mission [facility] called the office stating that Dr. Kayleen Memos is requesting cardiac clearance prior to patient's left breast wide excision which is scheduled for 08-20-17. Per Nira Conn, there was a change in EKG from previous EKG done in February 2018.   Patient has seen Dr. Rockey Situ in the past.   I contacted Lucan at Dr. Donivan Scull office and she will send a message to the nurse to see if patient will require a visit for cardiac clearance and will call our office back with status.

## 2017-08-15 NOTE — Pre-Procedure Instructions (Signed)
CARDIAC CLEARANCE IN EPIC BY DR Rockey Situ

## 2017-08-15 NOTE — Pre-Procedure Instructions (Signed)
[name], MD Physician Signed   Telephone Encounter Encounter Date: 08/14/2017  Related encounter: Telephone from 08/14/2017 in [place]       [] Hide copied text [] Hover for attribution information acceptrable risk for surgery No further testing needed

## 2017-08-18 ENCOUNTER — Telehealth: Payer: Self-pay | Admitting: *Deleted
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
## 2017-08-18 NOTE — Telephone Encounter (Signed)
Patient aware that we have received clearance and it's okay to proceed with surgery as scheduled.   This patient verbalizes understanding.

## 2017-08-19 ENCOUNTER — Telehealth: Payer: Self-pay | Admitting: *Deleted

## 2017-08-19 NOTE — Telephone Encounter (Signed)
Patient's daughter Kelli Williams called to let you know that her mother, Kelli Williams would like to proceed with surgery as planned for tomorrow.

## 2017-08-19 NOTE — Pre-Procedure Instructions (Signed)
CARDIAC CLEARANCE ON CHART AND IN EPIC FROM DR Rockey Situ

## 2017-08-20 ENCOUNTER — Ambulatory Visit: Payer: Medicare Other | Admitting: Anesthesiology

## 2017-08-20 ENCOUNTER — Encounter: Payer: Self-pay | Admitting: Anesthesiology

## 2017-08-20 ENCOUNTER — Ambulatory Visit: Payer: Medicare Other

## 2017-08-20 ENCOUNTER — Encounter
Admission: RE | Disposition: A | Discharge status: Home / Self Care | Payer: Self-pay | Source: Ambulatory Visit | Attending: General Surgery

## 2017-08-20 ENCOUNTER — Ambulatory Visit
Admission: RE | Admit: 2017-08-20 | Discharge: 2017-08-20 | Disposition: A | Discharge status: Home / Self Care | Payer: Medicare Other | Source: Ambulatory Visit | Attending: General Surgery | Admitting: General Surgery

## 2017-08-20 DIAGNOSIS — C50912 Malignant neoplasm of unspecified site of left female breast: Secondary | ICD-10-CM | POA: Insufficient documentation

## 2017-08-20 DIAGNOSIS — Z9842 Cataract extraction status, left eye: Secondary | ICD-10-CM | POA: Diagnosis not present

## 2017-08-20 DIAGNOSIS — C50512 Malignant neoplasm of lower-outer quadrant of left female breast: Secondary | ICD-10-CM | POA: Diagnosis not present

## 2017-08-20 DIAGNOSIS — C773 Secondary and unspecified malignant neoplasm of axilla and upper limb lymph nodes: Secondary | ICD-10-CM | POA: Diagnosis not present

## 2017-08-20 DIAGNOSIS — F419 Anxiety disorder, unspecified: Secondary | ICD-10-CM | POA: Insufficient documentation

## 2017-08-20 DIAGNOSIS — Z85828 Personal history of other malignant neoplasm of skin: Secondary | ICD-10-CM | POA: Insufficient documentation

## 2017-08-20 DIAGNOSIS — Z8249 Family history of ischemic heart disease and other diseases of the circulatory system: Secondary | ICD-10-CM | POA: Insufficient documentation

## 2017-08-20 DIAGNOSIS — M1990 Unspecified osteoarthritis, unspecified site: Secondary | ICD-10-CM | POA: Insufficient documentation

## 2017-08-20 DIAGNOSIS — G6289 Other specified polyneuropathies: Secondary | ICD-10-CM | POA: Insufficient documentation

## 2017-08-20 DIAGNOSIS — Z170 Estrogen receptor positive status [ER+]: Secondary | ICD-10-CM | POA: Diagnosis not present

## 2017-08-20 DIAGNOSIS — N6320 Unspecified lump in the left breast, unspecified quadrant: Secondary | ICD-10-CM

## 2017-08-20 DIAGNOSIS — K219 Gastro-esophageal reflux disease without esophagitis: Secondary | ICD-10-CM | POA: Insufficient documentation

## 2017-08-20 DIAGNOSIS — Z809 Family history of malignant neoplasm, unspecified: Secondary | ICD-10-CM | POA: Insufficient documentation

## 2017-08-20 DIAGNOSIS — Z9049 Acquired absence of other specified parts of digestive tract: Secondary | ICD-10-CM | POA: Insufficient documentation

## 2017-08-20 DIAGNOSIS — Z96651 Presence of right artificial knee joint: Secondary | ICD-10-CM | POA: Diagnosis not present

## 2017-08-20 DIAGNOSIS — Z90710 Acquired absence of both cervix and uterus: Secondary | ICD-10-CM | POA: Insufficient documentation

## 2017-08-20 DIAGNOSIS — Z79899 Other long term (current) drug therapy: Secondary | ICD-10-CM | POA: Insufficient documentation

## 2017-08-20 DIAGNOSIS — Z803 Family history of malignant neoplasm of breast: Secondary | ICD-10-CM | POA: Insufficient documentation

## 2017-08-20 DIAGNOSIS — I10 Essential (primary) hypertension: Secondary | ICD-10-CM | POA: Diagnosis not present

## 2017-08-20 HISTORY — PX: BREAST LUMPECTOMY: SHX2

## 2017-08-20 SURGERY — BREAST LUMPECTOMY
Anesthesia: General | Laterality: Left | Wound class: Clean

## 2017-08-20 MED ORDER — BUPIVACAINE-EPINEPHRINE (PF) 0.5% -1:200000 IJ SOLN
INTRAMUSCULAR | Status: DC | PRN
  Administered 2017-08-20: 20 mL via PERINEURAL
  Administered 2017-08-20: 10 mL via PERINEURAL

## 2017-08-20 MED ORDER — TRAMADOL HCL 50 MG PO TABS: 50.0000 mg | ORAL_TABLET | ORAL | 0 refills | Status: DC | PRN

## 2017-08-20 MED ORDER — ONDANSETRON HCL 4 MG/2ML IJ SOLN: 4.0000 mg | Freq: Once | INTRAMUSCULAR | Status: DC | PRN

## 2017-08-20 MED ORDER — FENTANYL CITRATE (PF) 100 MCG/2ML IJ SOLN
25.0000 ug | INTRAMUSCULAR | Status: DC | PRN
  Administered 2017-08-20 (×4): 25 ug via INTRAVENOUS

## 2017-08-20 MED ORDER — EPHEDRINE SULFATE 50 MG/ML IJ SOLN
INTRAMUSCULAR | Status: DC | PRN
  Administered 2017-08-20: 5 mg via INTRAVENOUS

## 2017-08-20 MED ORDER — GLYCOPYRROLATE 0.2 MG/ML IJ SOLN
INTRAMUSCULAR | Status: DC | PRN
  Administered 2017-08-20: 0.2 mg via INTRAVENOUS

## 2017-08-20 MED ORDER — LACTATED RINGERS IV SOLN
INTRAVENOUS | Status: DC
  Administered 2017-08-20: 12:00:00 via INTRAVENOUS

## 2017-08-20 MED ORDER — BUPIVACAINE-EPINEPHRINE (PF) 0.5% -1:200000 IJ SOLN
INTRAMUSCULAR | Status: AC
  Filled 2017-08-20: qty 30

## 2017-08-20 MED ORDER — FENTANYL CITRATE (PF) 100 MCG/2ML IJ SOLN
INTRAMUSCULAR | Status: AC
  Administered 2017-08-20: 25 ug via INTRAVENOUS
  Filled 2017-08-20: qty 2

## 2017-08-20 MED ORDER — DEXAMETHASONE SODIUM PHOSPHATE 10 MG/ML IJ SOLN
INTRAMUSCULAR | Status: DC | PRN
  Administered 2017-08-20: 5 mg via INTRAVENOUS

## 2017-08-20 MED ORDER — FENTANYL CITRATE (PF) 100 MCG/2ML IJ SOLN
INTRAMUSCULAR | Status: DC | PRN
  Administered 2017-08-20: 25 ug via INTRAVENOUS

## 2017-08-20 MED ORDER — ONDANSETRON HCL 4 MG/2ML IJ SOLN
INTRAMUSCULAR | Status: DC | PRN
  Administered 2017-08-20: 4 mg via INTRAVENOUS

## 2017-08-20 MED ORDER — LIDOCAINE HCL (CARDIAC) 20 MG/ML IV SOLN
INTRAVENOUS | Status: DC | PRN
  Administered 2017-08-20: 40 mg via INTRAVENOUS

## 2017-08-20 MED ORDER — FENTANYL CITRATE (PF) 100 MCG/2ML IJ SOLN
INTRAMUSCULAR | Status: AC
  Filled 2017-08-20: qty 2

## 2017-08-20 SURGICAL SUPPLY — 47 items
BLADE SURG 15 STRL SS SAFETY (BLADE) ×3 IMPLANT
BRA SURGICAL LRG (MISCELLANEOUS) IMPLANT
BRA SURGICAL XLRG (MISCELLANEOUS) IMPLANT
BULB RESERV EVAC DRAIN JP 100C (MISCELLANEOUS) IMPLANT
CANISTER SUCT 1200ML W/VALVE (MISCELLANEOUS) ×3 IMPLANT
CHLORAPREP W/TINT 26ML (MISCELLANEOUS) ×3 IMPLANT
CLOSURE WOUND 1/2 X4 (GAUZE/BANDAGES/DRESSINGS) ×1
CNTNR SPEC 2.5X3XGRAD LEK (MISCELLANEOUS)
CONT SPEC 4OZ STER OR WHT (MISCELLANEOUS)
CONTAINER SPEC 2.5X3XGRAD LEK (MISCELLANEOUS) IMPLANT
COVER PROBE FLX POLY STRL (MISCELLANEOUS) ×3 IMPLANT
DRAIN CHANNEL JP 15F RND 16 (MISCELLANEOUS) IMPLANT
DRAPE LAPAROTOMY TRNSV 106X77 (MISCELLANEOUS) ×3 IMPLANT
DRSG TEGADERM 2-3/8X2-3/4 SM (GAUZE/BANDAGES/DRESSINGS) ×3 IMPLANT
DRSG TELFA 3X8 NADH (GAUZE/BANDAGES/DRESSINGS) ×3 IMPLANT
ELECT CAUTERY BLADE TIP 2.5 (TIP) ×3
ELECT REM PT RETURN 9FT ADLT (ELECTROSURGICAL) ×3
ELECTRODE CAUTERY BLDE TIP 2.5 (TIP) ×1 IMPLANT
ELECTRODE REM PT RTRN 9FT ADLT (ELECTROSURGICAL) ×1 IMPLANT
GAUZE FLUFF 18X24 1PLY STRL (GAUZE/BANDAGES/DRESSINGS) ×3 IMPLANT
GLOVE BIO SURGEON STRL SZ7.5 (GLOVE) ×9 IMPLANT
GLOVE INDICATOR 8.0 STRL GRN (GLOVE) ×9 IMPLANT
GOWN STRL REUS W/ TWL LRG LVL3 (GOWN DISPOSABLE) ×3 IMPLANT
GOWN STRL REUS W/TWL LRG LVL3 (GOWN DISPOSABLE) ×6
KIT RM TURNOVER STRD PROC AR (KITS) ×3 IMPLANT
LABEL OR SOLS (LABEL) ×3 IMPLANT
MARGIN MAP 10MM (MISCELLANEOUS) ×3 IMPLANT
NDL SAFETY 22GX1.5 (NEEDLE) ×3 IMPLANT
NEEDLE HYPO 25X1 1.5 SAFETY (NEEDLE) ×3 IMPLANT
PACK BASIN MINOR ARMC (MISCELLANEOUS) ×3 IMPLANT
SHEARS FOC LG CVD HARMONIC 17C (MISCELLANEOUS) IMPLANT
SHEARS HARMONIC 9CM CVD (BLADE) IMPLANT
STRIP CLOSURE SKIN 1/2X4 (GAUZE/BANDAGES/DRESSINGS) ×2 IMPLANT
SUT ETHILON 3-0 FS-10 30 BLK (SUTURE) ×3
SUT SILK 2 0 (SUTURE) ×2
SUT SILK 2-0 18XBRD TIE 12 (SUTURE) ×1 IMPLANT
SUT VIC AB 2-0 CT1 27 (SUTURE) ×4
SUT VIC AB 2-0 CT1 TAPERPNT 27 (SUTURE) ×2 IMPLANT
SUT VIC AB 4-0 FS2 27 (SUTURE) ×3 IMPLANT
SUT VICRYL+ 3-0 144IN (SUTURE) ×3 IMPLANT
SUTURE EHLN 3-0 FS-10 30 BLK (SUTURE) ×1 IMPLANT
SWABSTK COMLB BENZOIN TINCTURE (MISCELLANEOUS) ×3 IMPLANT
SYR BULB IRRIG 60ML STRL (SYRINGE) IMPLANT
SYR CONTROL 10ML (SYRINGE) ×3 IMPLANT
SYRINGE 10CC LL (SYRINGE) IMPLANT
TAPE TRANSPORE STRL 2 31045 (GAUZE/BANDAGES/DRESSINGS) IMPLANT
WATER STERILE IRR 1000ML POUR (IV SOLUTION) ×3 IMPLANT

## 2017-08-20 NOTE — Anesthesia Post-op Follow-up Note (Signed)
Anesthesia QCDR form completed.        

## 2017-08-20 NOTE — Transfer of Care (Signed)
Immediate Anesthesia Transfer of Care Note  Patient: Kelli Williams  Procedure(s) Performed: Procedure(s): left breast wide excision (Left)  Patient Location: PACU  Anesthesia Type:General  Level of Consciousness: awake, alert  and oriented  Airway & Oxygen Therapy: Patient Spontanous Breathing and Patient connected to nasal cannula oxygen  Post-op Assessment: Report given to RN and Post -op Vital signs reviewed and stable  Post vital signs: Reviewed and stable  Last Vitals:  Vitals:   08/20/17 1412 08/20/17 1448  BP: (!) 153/63 (!) 158/61  Pulse: (!) 51 (!) 49  Resp: 14 14  Temp: (!) 36.2 C   SpO2: 98% 98%    Last Pain:  Vitals:   08/20/17 1356  TempSrc:   PainSc: 4          Complications: No apparent anesthesia complications

## 2017-08-20 NOTE — Anesthesia Preprocedure Evaluation (Addendum)
Anesthesia Evaluation  Patient identified by MRN, date of birth, ID band Patient awake    Reviewed: Allergy & Precautions, NPO status , Patient's Chart, lab work & pertinent test results, reviewed documented beta blocker date and time   History of Anesthesia Complications (+) history of anesthetic complications  Airway Mallampati: III  TM Distance: >3 FB     Dental  (+) Chipped   Pulmonary pneumonia, resolved,           Cardiovascular hypertension, Pt. on medications and Pt. on home beta blockers + dysrhythmias      Neuro/Psych Anxiety  Neuromuscular disease    GI/Hepatic GERD  Controlled,  Endo/Other    Renal/GU      Musculoskeletal  (+) Arthritis ,   Abdominal   Peds  Hematology   Anesthesia Other Findings Hx of tachy. Hx of PVCs. Poor R waves. No cardiac symptoms.  Reproductive/Obstetrics                            Anesthesia Physical Anesthesia Plan  ASA: III  Anesthesia Plan: General   Post-op Pain Management:    Induction: Intravenous  PONV Risk Score and Plan:   Airway Management Planned: LMA  Additional Equipment:   Intra-op Plan:   Post-operative Plan:   Informed Consent: I have reviewed the patients History and Physical, chart, labs and discussed the procedure including the risks, benefits and alternatives for the proposed anesthesia with the patient or authorized representative who has indicated his/her understanding and acceptance.     Plan Discussed with: CRNA  Anesthesia Plan Comments:         Anesthesia Quick Evaluation

## 2017-08-20 NOTE — Op Note (Signed)
Preoperative diagnosis: Left axillary mass, breast primary versus metastatic disease.  Postoperative diagnosis: Same.  Operative procedure: Excision left axillary mass, ultrasound guidance.  Operating surgeon: Ollen Bowl, M.D.  Anesthesia: LMA with Marcaine 0.5% with 1-200,000 epinephrine, 30 mL.  Estimated blood loss: Less than 5 mL.  Clinical note this 83 year old woman was identified with a new left axillary mass showed evidence of invasive mammary carcinoma with lobular features. No nodal tissue noted. Previously treated for breast cancer in 2000 with invasive ductal carcinoma as the histology.No additional lesions noted on mammography to suggest a new primary. He elected to proceed to excision of the axtail/axillary lesion to help better identify the pathology and whether this is a new primary or metastatic disease.  Operative note: The patient underwent general anesthesia without difficulty. The breast and axilla was prepped with ChloraPrep and draped. Ultrasound was used to confirm location of the mass in the left axilla. Marcaine was instilled for postoperative analgesia. A transverse incision about a centimeter below the previous sentinel node biopsy incision was made and carried aphthous consulting this tissue with hemostasis achieved by electrocautery. The deeper tissue was approximated. It was still difficult to tell whether this lesion was in the axillary tail of the breast or a node. This was excised and orientated and specimen radiograph confirmed the previously placed clip was in place. This was close to the medial and superior borders. Hemostasis in the deep level was with 3-0 Vicryl ties. The deep tissue was approximated with a series of 2-0 Vicryl figure-of-eight sutures. The skin was closed with a running 4-0 Vicryl subcuticular suture. Benzoin, Steri-Strips followed by Telfa and Tegaderm dressing.  Patient tolerated the procedure well was taken recovery room in stable  condition.

## 2017-08-20 NOTE — Anesthesia Procedure Notes (Signed)
Procedure Name: LMA Insertion Date/Time: 08/20/2017 12:36 PM Performed by: Hedda Slade Pre-anesthesia Checklist: Patient identified, Patient being monitored, Timeout performed, Emergency Drugs available and Suction available Patient Re-evaluated:Patient Re-evaluated prior to induction Oxygen Delivery Method: Circle system utilized Preoxygenation: Pre-oxygenation with 100% oxygen Induction Type: IV induction Ventilation: Mask ventilation without difficulty LMA: LMA inserted LMA Size: 4.0 Tube type: Oral Number of attempts: 1 Placement Confirmation: positive ETCO2 and breath sounds checked- equal and bilateral Tube secured with: Tape Dental Injury: Teeth and Oropharynx as per pre-operative assessment

## 2017-08-20 NOTE — H&P (Signed)
No change in clinical exam or history. For excision left axillary tail lesion.

## 2017-08-20 NOTE — Anesthesia Postprocedure Evaluation (Signed)
Anesthesia Post Note  Patient: Kelli Williams  Procedure(s) Performed: Procedure(s) (LRB): left breast wide excision (Left)  Patient location during evaluation: PACU Anesthesia Type: General Level of consciousness: awake and alert Pain management: pain level controlled Vital Signs Assessment: post-procedure vital signs reviewed and stable Respiratory status: spontaneous breathing, nonlabored ventilation, respiratory function stable and patient connected to nasal cannula oxygen Cardiovascular status: blood pressure returned to baseline and stable Postop Assessment: no signs of nausea or vomiting Anesthetic complications: no     Last Vitals:  Vitals:   08/20/17 1402 08/20/17 1412  BP: (!) 174/74 (!) 153/63  Pulse: (!) 55 (!) 51  Resp: 13 14  Temp: (!) 36.2 C (!) 36.2 C  SpO2: 96% 98%    Last Pain:  Vitals:   08/20/17 1356  TempSrc:   PainSc: [place]

## 2017-08-20 NOTE — Discharge Instructions (Signed)

## 2017-08-21 ENCOUNTER — Encounter: Payer: Self-pay | Admitting: General Surgery

## 2017-08-21 ENCOUNTER — Other Ambulatory Visit: Payer: Self-pay | Admitting: Internal Medicine

## 2017-08-21 LAB — SURGICAL PATHOLOGY

## 2017-08-27 ENCOUNTER — Encounter: Payer: Self-pay | Admitting: General Surgery

## 2017-08-27 ENCOUNTER — Ambulatory Visit (INDEPENDENT_AMBULATORY_CARE_PROVIDER_SITE_OTHER): Payer: Medicare Other | Admitting: General Surgery

## 2017-08-27 VITALS — BP 150/82 | HR 71 | Resp 16 | Ht 66.0 in | Wt 194.0 lb

## 2017-08-27 DIAGNOSIS — C50412 Malignant neoplasm of upper-outer quadrant of left female breast: Secondary | ICD-10-CM

## 2017-08-27 DIAGNOSIS — Z170 Estrogen receptor positive status [ER+]: Secondary | ICD-10-CM

## 2017-08-27 NOTE — Patient Instructions (Signed)
May apply heat to the right wrist as needed.
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
## 2017-08-28 ENCOUNTER — Other Ambulatory Visit: Payer: Self-pay

## 2017-09-01 ENCOUNTER — Ambulatory Visit
Admission: RE | Admit: 2017-09-01 | Discharge: 2017-09-01 | Disposition: A | Discharge status: Home / Self Care | Payer: Medicare Other | Source: Ambulatory Visit | Attending: Hematology and Oncology | Admitting: Hematology and Oncology

## 2017-09-01 DIAGNOSIS — M8588 Other specified disorders of bone density and structure, other site: Secondary | ICD-10-CM | POA: Insufficient documentation

## 2017-09-01 DIAGNOSIS — C50412 Malignant neoplasm of upper-outer quadrant of left female breast: Secondary | ICD-10-CM | POA: Diagnosis present

## 2017-09-01 DIAGNOSIS — Z170 Estrogen receptor positive status [ER+]: Secondary | ICD-10-CM | POA: Diagnosis present

## 2017-09-01 DIAGNOSIS — M8589 Other specified disorders of bone density and structure, multiple sites: Secondary | ICD-10-CM | POA: Diagnosis not present

## 2017-09-01 DIAGNOSIS — Z780 Asymptomatic menopausal state: Secondary | ICD-10-CM | POA: Diagnosis not present

## 2017-09-04 ENCOUNTER — Inpatient Hospital Stay: Payer: Medicare Other

## 2017-09-04 ENCOUNTER — Encounter: Payer: Self-pay | Admitting: Hematology and Oncology

## 2017-09-04 ENCOUNTER — Inpatient Hospital Stay: Payer: Medicare Other | Attending: Hematology and Oncology | Admitting: Hematology and Oncology

## 2017-09-04 VITALS — BP 186/84 | HR 68 | Temp 97.5°F | Resp 18 | Wt 202.2 lb

## 2017-09-04 DIAGNOSIS — Z809 Family history of malignant neoplasm, unspecified: Secondary | ICD-10-CM | POA: Insufficient documentation

## 2017-09-04 DIAGNOSIS — G8929 Other chronic pain: Secondary | ICD-10-CM

## 2017-09-04 DIAGNOSIS — I10 Essential (primary) hypertension: Secondary | ICD-10-CM | POA: Diagnosis not present

## 2017-09-04 DIAGNOSIS — C50412 Malignant neoplasm of upper-outer quadrant of left female breast: Secondary | ICD-10-CM | POA: Insufficient documentation

## 2017-09-04 DIAGNOSIS — Z170 Estrogen receptor positive status [ER+]: Secondary | ICD-10-CM | POA: Diagnosis not present

## 2017-09-04 DIAGNOSIS — Z923 Personal history of irradiation: Secondary | ICD-10-CM | POA: Diagnosis not present

## 2017-09-04 DIAGNOSIS — Z79899 Other long term (current) drug therapy: Secondary | ICD-10-CM | POA: Insufficient documentation

## 2017-09-04 DIAGNOSIS — Z85828 Personal history of other malignant neoplasm of skin: Secondary | ICD-10-CM | POA: Insufficient documentation

## 2017-09-04 DIAGNOSIS — Z853 Personal history of malignant neoplasm of breast: Secondary | ICD-10-CM

## 2017-09-04 DIAGNOSIS — C773 Secondary and unspecified malignant neoplasm of axilla and upper limb lymph nodes: Secondary | ICD-10-CM | POA: Insufficient documentation

## 2017-09-04 DIAGNOSIS — M8580 Other specified disorders of bone density and structure, unspecified site: Secondary | ICD-10-CM

## 2017-09-04 DIAGNOSIS — M25562 Pain in left knee: Secondary | ICD-10-CM | POA: Diagnosis not present

## 2017-09-04 DIAGNOSIS — M5136 Other intervertebral disc degeneration, lumbar region: Secondary | ICD-10-CM | POA: Diagnosis not present

## 2017-09-04 DIAGNOSIS — M25552 Pain in left hip: Secondary | ICD-10-CM | POA: Insufficient documentation

## 2017-09-04 DIAGNOSIS — M1990 Unspecified osteoarthritis, unspecified site: Secondary | ICD-10-CM

## 2017-09-04 DIAGNOSIS — K219 Gastro-esophageal reflux disease without esophagitis: Secondary | ICD-10-CM | POA: Diagnosis not present

## 2017-09-04 DIAGNOSIS — G629 Polyneuropathy, unspecified: Secondary | ICD-10-CM | POA: Diagnosis not present

## 2017-09-04 DIAGNOSIS — Z9223 Personal history of estrogen therapy: Secondary | ICD-10-CM | POA: Insufficient documentation

## 2017-09-04 DIAGNOSIS — Z7982 Long term (current) use of aspirin: Secondary | ICD-10-CM

## 2017-09-04 LAB — COMPREHENSIVE METABOLIC PANEL
ALT: 11 U/L — ABNORMAL LOW (ref 14–54)
AST: 19 U/L (ref 15–41)
Albumin: 3.9 g/dL (ref 3.5–5.0)
Alkaline Phosphatase: 85 U/L (ref 38–126)
Anion gap: 6 (ref 5–15)
BUN: 24 mg/dL — ABNORMAL HIGH (ref 6–20)
CO2: 31 mmol/L (ref 22–32)
Calcium: 9.4 mg/dL (ref 8.9–10.3)
Chloride: 100 mmol/L — ABNORMAL LOW (ref 101–111)
Creatinine, Ser: 0.84 mg/dL (ref 0.44–1.00)
GFR calc Af Amer: >60 mL/min (ref >60)
GFR calc non Af Amer: 60 mL/min — ABNORMAL LOW (ref >60)
Glucose, Bld: 94 mg/dL (ref 65–99)
Potassium: 4.2 mmol/L (ref 3.5–5.1)
Sodium: 137 mmol/L (ref 135–145)
Total Bilirubin: 0.4 mg/dL (ref 0.3–1.2)
Total Protein: 7.7 g/dL (ref 6.5–8.1)

## 2017-09-04 LAB — CBC WITH DIFFERENTIAL/PLATELET
Basophils Absolute: 0.1 10*3/uL (ref 0–0.1)
Basophils Relative: 1 %
Eosinophils Absolute: 0.4 10*3/uL (ref 0–0.7)
Eosinophils Relative: 6 %
HCT: 38.4 % (ref 35.0–47.0)
Hemoglobin: 13.2 g/dL (ref 12.0–16.0)
Lymphocytes Relative: 21 %
Lymphs Abs: 1.6 10*3/uL (ref 1.0–3.6)
MCH: 29.8 pg (ref 26.0–34.0)
MCHC: 34.4 g/dL (ref 32.0–36.0)
MCV: 86.6 fL (ref 80.0–100.0)
Monocytes Absolute: 0.6 10*3/uL (ref 0.2–0.9)
Monocytes Relative: 8 %
Neutro Abs: 4.9 10*3/uL (ref 1.4–6.5)
Neutrophils Relative %: 64 %
Platelets: 238 10*3/uL (ref 150–440)
RBC: 4.43 MIL/uL (ref 3.80–5.20)
RDW: 14.1 % (ref 11.5–14.5)
WBC: 7.6 10*3/uL (ref 3.6–11.0)

## 2017-09-04 NOTE — Patient Instructions (Signed)
Denosumab injection What is this medicine? DENOSUMAB (den oh sue mab) slows bone breakdown. Prolia is used to treat osteoporosis in women after menopause and in men. Delton See is used to treat a high calcium level due to cancer and to prevent bone fractures and other bone problems caused by multiple myeloma or cancer bone metastases. Delton See is also used to treat giant cell tumor of the bone. This medicine may be used for other purposes; ask your health care provider or pharmacist if you have questions. COMMON BRAND NAME(S): Prolia, XGEVA What should I tell my health care provider before I take this medicine? They need to know if you have any of these conditions: -dental disease -having surgery or tooth extraction -infection -kidney disease -low levels of calcium or Vitamin D in the blood -malnutrition -on hemodialysis -skin conditions or sensitivity -thyroid or parathyroid disease -an unusual reaction to denosumab, other medicines, foods, dyes, or preservatives -pregnant or trying to get pregnant -breast-feeding How should I use this medicine? This medicine is for injection under the skin. It is given by a health care professional in a hospital or clinic setting. If you are getting Prolia, a special MedGuide will be given to you by the pharmacist with each prescription and refill. Be sure to read this information carefully each time. For Prolia, talk to your pediatrician regarding the use of this medicine in children. Special care may be needed. For Delton See, talk to your pediatrician regarding the use of this medicine in children. While this drug may be prescribed for children as young as 13 years for selected conditions, precautions do apply. Overdosage: If you think you have taken too much of this medicine contact a poison control center or emergency room at once. NOTE: This medicine is only for you. Do not share this medicine with others. What if I miss a dose? It is important not to miss your  dose. Call your doctor or health care professional if you are unable to keep an appointment. What may interact with this medicine? Do not take this medicine with any of the following medications: -other medicines containing denosumab This medicine may also interact with the following medications: -medicines that lower your chance of fighting infection -steroid medicines like prednisone or cortisone This list may not describe all possible interactions. Give your health care provider a list of all the medicines, herbs, non-prescription drugs, or dietary supplements you use. Also tell them if you smoke, drink alcohol, or use illegal drugs. Some items may interact with your medicine. What should I watch for while using this medicine? Visit your doctor or health care professional for regular checks on your progress. Your doctor or health care professional may order blood tests and other tests to see how you are doing. Call your doctor or health care professional for advice if you get a fever, chills or sore throat, or other symptoms of a cold or flu. Do not treat yourself. This drug may decrease your body's ability to fight infection. Try to avoid being around people who are sick. You should make sure you get enough calcium and vitamin D while you are taking this medicine, unless your doctor tells you not to. Discuss the foods you eat and the vitamins you take with your health care professional. See your dentist regularly. Brush and floss your teeth as directed. Before you have any dental work done, tell your dentist you are receiving this medicine. Do not become pregnant while taking this medicine or for 5 months after stopping  it. Talk with your doctor or health care professional about your birth control options while taking this medicine. Women should inform their doctor if they wish to become pregnant or think they might be pregnant. There is a potential for serious side effects to an unborn child. Talk  to your health care professional or pharmacist for more information. What side effects may I notice from receiving this medicine? Side effects that you should report to your doctor or health care professional as soon as possible: -allergic reactions like skin rash, itching or hives, swelling of the face, lips, or tongue -bone pain -breathing problems -dizziness -jaw pain, especially after dental work -redness, blistering, peeling of the skin -signs and symptoms of infection like fever or chills; cough; sore throat; pain or trouble passing urine -signs of low calcium like fast heartbeat, muscle cramps or muscle pain; pain, tingling, numbness in the hands or feet; seizures -unusual bleeding or bruising -unusually weak or tired Side effects that usually do not require medical attention (report to your doctor or health care professional if they continue or are bothersome): -constipation -diarrhea -headache -joint pain -loss of appetite -muscle pain -runny nose -tiredness -upset stomach This list may not describe all possible side effects. Call your doctor for medical advice about side effects. You may report side effects to FDA at 1-800-FDA-1088. Where should I keep my medicine? This medicine is only given in a clinic, doctor's office, or other health care setting and will not be stored at home. NOTE: This sheet is a summary. It may not cover all possible information. If you have questions about this medicine, talk to your doctor, pharmacist, or health care provider.  2018 [employer] Standard (2016-12-17 19:17:21) Tamoxifen oral solution What is this medicine? TAMOXIFEN (ta MOX i fen) blocks the effects of estrogen. It is commonly used to treat breast cancer. It is also used to decrease the chance of breast cancer coming back in women who have received treatment for the disease. It may also help prevent breast cancer in women who have a high risk of developing breast cancer. This  medicine may be used for other purposes; ask your health care provider or pharmacist if you have questions. COMMON BRAND NAME(S): Soltamox What should I tell my health care provider before I take this medicine? They need to know if you have any of these conditions: -blood clots -blood disease -cataracts or impaired eyesight -endometriosis -high calcium levels -high cholesterol -irregular menstrual cycles -liver disease -stroke -uterine fibroids -an unusual reaction to tamoxifen, other medicines, foods, dyes, or preservatives -pregnant or trying to get pregnant -breast-feeding How should I use this medicine? Take this medicine by mouth with a glass of water. Follow the directions on the prescription label. You can take it with or without food. Take your medicine at regular intervals. Do not take your medicine more often than directed. Do not stop taking except on your doctor's advice. A special MedGuide will be given to you by the pharmacist with each prescription and refill. Be sure to read this information carefully each time. Talk to your pediatrician regarding the use of this medicine in children. While this drug may be prescribed for selected conditions, precautions do apply. Overdosage: If you think you have taken too much of this medicine contact a poison control center or emergency room at once. NOTE: This medicine is only for you. Do not share this medicine with others. What if I miss a dose? If you miss a dose, take it as soon  as you can. If it is almost time for your next dose, take only that dose. Do not take double or extra doses. What may interact with this medicine? Do not take this medicine with any of the following medications: -cisapride -certain medicines for irregular heart beat like dofetilide, dronedarone, quinidine -certain medicines for fungal infection like fluconazole, posaconazole -pimozide -saquinavir -thioridazine This medicine may also interact with the  following medications: -aminoglutethimide -anastrozole -bromocriptine -chemotherapy drugs -female hormones, like estrogens and birth control pills -letrozole -medroxyprogesterone -phenobarbital -rifampin -warfarin This list may not describe all possible interactions. Give your health care provider a list of all the medicines, herbs, non-prescription drugs, or dietary supplements you use. Also tell them if you smoke, drink alcohol, or use illegal drugs. Some items may interact with your medicine. What should I watch for while using this medicine? Visit your doctor or health care professional for regular checks on your progress. You will need regular pelvic exams, breast exams, and mammograms. If you are taking this medicine to reduce your risk of getting breast cancer, you should know that this medicine does not prevent all types of breast cancer. If breast cancer or other problems occur, there is no guarantee that it will be found at an early stage. Do not become pregnant while taking this medicine or for 2 months after stopping this medicine. Stop taking this medicine if you get pregnant or think you are pregnant and contact your doctor. This medicine may harm your unborn baby. Women who can possibly become pregnant should use birth control methods that do not use hormones during tamoxifen treatment and for 2 months after therapy has stopped. Talk with your health care provider for birth control advice. Do not breast feed while taking this medicine. What side effects may I notice from receiving this medicine? Side effects that you should report to your doctor or health care professional as soon as possible: -allergic reactions like skin rash, itching or hives, swelling of the face, lips, or tongue -changes in vision -changes in your menstrual cycle -difficulty walking or talking -new breast lumps -numbness -pelvic pain or pressure -redness, blistering, peeling or loosening of the skin,  including inside the mouth -signs and symptoms of a dangerous change in heartbeat or heart rhythm like chest pain, dizziness, fast or irregular heartbeat, palpitations, feeling faint or lightheaded, falls, breathing problems -sudden chest pain -swelling, pain or tenderness in your calf or leg -unusual bruising or bleeding -vaginal discharge that is bloody, brown, or rust -weakness -yellowing of the whites of the eyes or skin Side effects that usually do not require medical attention (report to your doctor or health care professional if they continue or are bothersome): -fatigue -hair loss, although uncommon and is usually mild -headache -hot flashes -impotence (in men) -nausea, vomiting (mild) -vaginal discharge (white or clear) This list may not describe all possible side effects. Call your doctor for medical advice about side effects. You may report side effects to FDA at 1-800-FDA-1088. Where should I keep my medicine? Keep out of the reach of children. Store in the original package at room temperature between 20 and 25 degrees C (68 and 77 degrees F). Do not store above 25 degrees C (77 degrees F). DO NOT freeze or refrigerate. Protect from light. Keep container tightly closed. Use within 3 months of opening. Throw away any unused medicine after the expiration date. NOTE: This sheet is a summary. It may not cover all possible information. If you have questions about this medicine,  talk to your doctor, pharmacist, or health care provider.  2018 [employer] Standard (2016-06-14 07:13:59) Letrozole tablets What is this medicine? LETROZOLE (LET roe zole) blocks the production of estrogen. It is used to treat breast cancer. This medicine may be used for other purposes; ask your health care provider or pharmacist if you have questions. COMMON BRAND NAME(S): Femara What should I tell my health care provider before I take this medicine? They need to know if you have any of these  conditions: -high cholesterol -liver disease -osteoporosis (weak bones) -an unusual or allergic reaction to letrozole, other medicines, foods, dyes, or preservatives -pregnant or trying to get pregnant -breast-feeding How should I use this medicine? Take this medicine by mouth with a glass of water. You may take it with or without food. Follow the directions on the prescription label. Take your medicine at regular intervals. Do not take your medicine more often than directed. Do not stop taking except on your doctor's advice. Talk to your pediatrician regarding the use of this medicine in children. Special care may be needed. Overdosage: If you think you have taken too much of this medicine contact a poison control center or emergency room at once. NOTE: This medicine is only for you. Do not share this medicine with others. What if I miss a dose? If you miss a dose, take it as soon as you can. If it is almost time for your next dose, take only that dose. Do not take double or extra doses. What may interact with this medicine? Do not take this medicine with any of the following medications: -estrogens, like hormone replacement therapy or birth control pills This medicine may also interact with the following medications: -dietary supplements such as androstenedione or DHEA -prasterone -tamoxifen This list may not describe all possible interactions. Give your health care provider a list of all the medicines, herbs, non-prescription drugs, or dietary supplements you use. Also tell them if you smoke, drink alcohol, or use illegal drugs. Some items may interact with your medicine. What should I watch for while using this medicine? Tell your doctor or healthcare professional if your symptoms do not start to get better or if they get worse. Do not become pregnant while taking this medicine or for 3 weeks after stopping it. Women should inform their doctor if they wish to become pregnant or think they  might be pregnant. There is a potential for serious side effects to an unborn child. Talk to your health care professional or pharmacist for more information. Do not breast-feed while taking this medicine or for 3 weeks after stopping it. This medicine may interfere with the ability to have a child. Talk with your doctor or health care professional if you are concerned about your fertility. Using this medicine for a long time may increase your risk of low bone mass. Talk to your doctor about bone health. You may get drowsy or dizzy. Do not drive, use machinery, or do anything that needs mental alertness until you know how this medicine affects you. Do not stand or sit up quickly, especially if you are an older patient. This reduces the risk of dizzy or fainting spells. You may need blood work done while you are taking this medicine. What side effects may I notice from receiving this medicine? Side effects that you should report to your doctor or health care professional as soon as possible: -allergic reactions like skin rash, itching, or hives -bone fracture -chest pain -signs and symptoms of a  blood clot such as breathing problems; changes in vision; chest pain; severe, sudden headache; pain, swelling, warmth in the leg; trouble speaking; sudden numbness or weakness of the face, arm or leg -vaginal bleeding Side effects that usually do not require medical attention (report to your doctor or health care professional if they continue or are bothersome): -bone, back, joint, or muscle pain -dizziness -fatigue -fluid retention -headache -hot flashes, night sweats -nausea -weight gain This list may not describe all possible side effects. Call your doctor for medical advice about side effects. You may report side effects to FDA at 1-800-FDA-1088. Where should I keep my medicine? Keep out of the reach of children. Store between 15 and 30 degrees C (59 and 86 degrees F). Throw away any unused  medicine after the expiration date. NOTE: This sheet is a summary. It may not cover all possible information. If you have questions about this medicine, talk to your doctor, pharmacist, or health care provider.  2018 [employer] Standard (2016-07-01 11:10:41)

## 2017-09-04 NOTE — Progress Notes (Signed)
Here for follow up

## 2017-09-04 NOTE — Progress Notes (Signed)
[facility] day:  09/04/2017  Chief Complaint: Kelli Williams is a 83 y.o. female with a history of stage I left breast cancer (2000) and clinical stage I left breast cancer s/p biopsy (2018) who is seen for assessment after interval wide excision.  HPI:  The patient was last seen in the medical oncology clinic on 07/17/2017 for initial consultation.  She had a history of stage I left breast cancer s/p wide excision and sentinel lymph node biopsy on 07/18/1999 at [facility].  Mammogram on 06/19/2017 revealed scattered areas of fibroglandular density and possible adenopathy. Left breast ultrasound on 07/01/2017 revealed a 1.3 x 1.1 x 1.0 cm left axillary lobulated mass.  Left axillary biopsy on 07/10/2017 revealed invasive mammary carcinoma with focal lobular features.  Tumor was grade II and measured 1.1 cm.  ER was + (> 90%), PR + (> 90%), and Her2/neu 2+ (FISH negative).  Mass initially appeared to be in the breast parenchyma rather than a lymph node.  At last visit, we discussed several options.  She was hesistant about surgery.  We discussed hormonal therapy.  A baseline bone density study was ordered.  Bone density on 09/01/2017 revealed osteopenia with a T-score of -1.9 in the right femoral neck and left forearm.  The lumbar spine was not tested secondary to advanced degenerative disease.  She underwent excision of the left axillary mass on 08/20/2017 by Dr. Bary Castilla.  Pathology revealed metastatic mammary carcinoma involving 1 of 4 axillary lymph nodes with extracapsular extension.  The superior medial margin was involved.  Symptomatically, she is doing well. Patient has chronic LEFT hip and knee pain. She has been seen in consult by orthopedics Rudene Christians).  She denies B symptoms.  She maintains a good appetite.  Weight is up 8 pounds since last visit.    Past Medical History:  Diagnosis Date  . Anxiety   . Arthritis    BOTH FEET AND KNEES  . Breast  cancer ([place]) 2000   left breast ca with lumpectomy and rad tx 5 yr tamoxifen/Dr Jeannine Kitten at [place]  . Complication of anesthesia    WOKE UP DURING SURGERY FOR DEVIATED SEPTUM, KNEE REPLACEMENT AND BREAST LUMPECTOMY  . Dysrhythmia   . GERD (gastroesophageal reflux disease)   . Hypertension   . Neuropathic pain of both legs   . Neuropathy   . Pneumonia 2016  . Skin cancer of forehead   . Skin cancer of nose     Past Surgical History:  Procedure Laterality Date  . ABDOMINAL HYSTERECTOMY    . AXILLARY LYMPH NODE BIOPSY Left 07/10/2017   INVASIVE MAMMARY CARCINOMA WITH FOCAL LOBULAR FEATURES.   Marland Kitchen BREAST BIOPSY Bilateral 1960   benign  . BREAST LUMPECTOMY Left 2000   breast ca with rad tx  . BREAST LUMPECTOMY Left 08/20/2017  . BREAST LUMPECTOMY Left 08/20/2017   Procedure: left breast wide excision;  Surgeon: Robert Bellow, MD;  Location: [facility] ORS;  Service: General;  Laterality: Left;  . CATARACT EXTRACTION     left eye  . CHOLECYSTECTOMY    . JOINT REPLACEMENT    . NASAL SEPTUM SURGERY    . PARTIAL KNEE ARTHROPLASTY     right  . TOTAL VAGINAL HYSTERECTOMY      Family History  Problem Relation Age of Onset  . Heart disease Mother   . Heart attack Paternal Uncle   . Breast cancer Daughter 91       genetic negative  .  Cancer Paternal Grandmother     Social History:  reports that she has never smoked. She has never used smokeless tobacco. She reports that she does not drink alcohol or use drugs.  The patient's husband died 2 years ago.  She was born outside of [place].  She previously worked as an Web designer.  She lives at the [place] [facility] at [place].  She is independent except for the use of her walker.  Her daughter is Gwenevere Williams.  The patient is alone today.  Allergies:  Allergies  Allergen Reactions  . Amitriptyline Hives  . Amoxicillin     Diarrhea    . Sulfa Antibiotics Rash  . Sulfa Antibiotics Rash    Current Medications: Current Outpatient  Prescriptions  Medication Sig Dispense Refill  . aspirin 81 MG tablet Take 1 tablet by mouth daily.    . Calcium-Magnesium 500-250 MG TABS Take 1 tablet by mouth daily.     Marland Kitchen gabapentin (NEURONTIN) 300 MG capsule Take 3 capsules (900 mg total) by mouth 2 (two) times daily. 540 capsule 3  . lisinopril-hydrochlorothiazide (PRINZIDE,ZESTORETIC) 20-25 MG tablet Take 1.5 tablets by mouth daily. 135 tablet 0  . meloxicam (MOBIC) 15 MG tablet Take 1 tablet (15 mg total) by mouth daily. 90 tablet 1  . Multiple Vitamins-Minerals (MULTIVITAMIN WITH MINERALS) tablet Take 1 tablet by mouth daily.    Marland Kitchen omeprazole (PRILOSEC) 20 MG capsule Take 1 capsule (20 mg total) by mouth daily. (Patient taking differently: Take 20 mg by mouth every morning. ) 90 capsule 1  . acetaminophen (TYLENOL) 325 MG tablet Take 650 mg by mouth every 6 (six) hours as needed.    . Coenzyme Q10 (CO Q-10) 100 MG CAPS Take by mouth daily.    . propranolol (INDERAL) 10 MG tablet TAKE 1 TABLET BY MOUTH UP  TO 3 TIMES DAILY AS NEEDED (Patient not taking: Reported on 09/04/2017) 270 tablet 1   No current facility-administered medications for this visit.     Review of Systems:  GENERAL:  Feels "ok".  No fevers or sweats.  Weight up 8 pounds.  PERFORMANCE STATUS (ECOG):  2 HEENT:  No visual changes, runny nose, sore throat, mouth sores or tenderness. Lungs: No shortness of breath or cough.  No hemoptysis. Cardiac:  No chest pain, palpitations, orthopnea, or PND. GI:  Irritable bowel exacerbated by nerves.  No nausea, vomiting,constipation, melena or hematochezia. GU:  No urgency, frequency, dysuria, or hematuria. Musculoskeletal:  Left hip and knee problems (chronic).  Right middle finger arthritis.  No muscle tenderness. Extremities:  No pain or swelling. Skin:  No rashes or skin changes. Neuro:  No headache, numbness or weakness, balance or coordination issues. Endocrine:  No diabetes, thyroid issues, hot flashes or night  sweats. Psych:  No mood changes, depression or anxiety. Pain:  No focal pain. Review of systems:  All other systems reviewed and found to be negative.  Physical Exam: Blood pressure (!) 186/84, pulse 68, temperature (!) 97.5 F (36.4 C), temperature source Tympanic, resp. rate 18, weight 202 lb 3.2 oz (91.7 kg). GENERAL:  Elderly woman sitting comfortably in the exam room in no acute distress.  She needs assistance onto the table.  She has a walker by her side. MENTAL STATUS:  Alert and oriented to person, place and time. HEAD:  Pearline Cables short styled hair.  Normocephalic, atraumatic, face symmetric, no Cushingoid features. EYES:  Hazel eyes.  Pupils equal round and reactive to light and accomodation.  No conjunctivitis or scleral icterus.  ENT:  Oropharynx clear without lesion.  Tongue normal. Mucous membranes moist.  RESPIRATORY:  Clear to auscultation without rales, wheezes or rhonchi. CARDIOVASCULAR:  Regular rate and rhythm without murmur, rub or gallop. BREAST: Left axillary incision well healing.  Steri-strips in place.   ABDOMEN:  Soft, non-tender, with active bowel sounds, and no hepatosplenomegaly.  No masses. SKIN:  No rashes, ulcers or lesions. EXTREMITIES: Chronic lower extremity changes.  No skin discoloration or tenderness.  No palpable cords. LYMPH NODES: No palpable cervical, supraclavicular, axillary or inguinal adenopathy  NEUROLOGICAL: Unremarkable. PSYCH:  Appropriate.   No visits with results within 3 Day(s) from this visit.  Latest known visit with results is:  Hospital Outpatient Visit on 07/10/2017  Component Date Value Ref Range Status  . SURGICAL PATHOLOGY 07/10/2017    Final-Edited                   Value:Surgical Pathology THIS IS AN ADDENDUM REPORT CASE: [id number] PATIENT: [facility] Surgical Pathology Report Addendum   Reason for Addendum #1:  Additional clinical/test information  SPECIMEN SUBMITTED: A. Axilla, left  CLINICAL HISTORY: Left  axillary mass in a patient with history of prior ipsilateral breast cancer  PRE-OPERATIVE DIAGNOSIS: Metastatic disease vs second primary cancer  POST-OPERATIVE DIAGNOSIS: None provided.     DIAGNOSIS: A.  LEFT AXILLA; ULTRASOUND-GUIDED NEEDLE CORE BIOPSY: - INVASIVE MAMMARY CARCINOMA WITH FOCAL LOBULAR FEATURES.  Size of invasive carcinoma:  1.1 cm in this sample Histologic grade of invasive carcinoma: Grade 2      Glandular/tubular differentiation score: 3      Nuclear pleomorphism score: 2      Mitotic rate score: 1  ER/PR/HER2: Immunohistochemistry will be performed, with reflex to [place] for HER2 2+ (equivocal) results. The results will be reported in an addendum.  The                          diagnosis was called to Eino Farber of [facility] on 07/11/17 at 227 PM.   GROSS DESCRIPTION:  A. The specimen is received in a formalin-filled container labeled with the patient's name and left axillary mass.  Core pieces: multiple, 2.0 x 0.9 x 0.1 cm Comments: yellow to red lobulated fibrofatty, marked green  Entirely submitted in cassette(s): 1  Time/Date in fixative: placed in formalin at 1:50 PM on 07/10/17 cold ischemic time of less than 5 minutes Total fixation time: 6.5 hours    Final Diagnosis performed by Delorse Lek, MD.  Electronically signed 07/11/2017 2:29:14PM   The electronic signature indicates that the named Attending Pathologist has evaluated the specimen  Technical component performed at [facility], [address], [place], [place] [address] Lab: [phone] Dir: Darrick Penna. Evette Doffing, MD  Professional component performed at [facility], [facility], [place], [place], [place] [address] Lab: [phone]                          Dir: Dellia Nims. Rubinas, MD   Breast Biomarker Reporting Template  BREAST BIOMARKER TESTS Estrogen Receptor (ER) Status: POSITIVE, >90% nuclear staining      Average intensity of  staining: Strong Progesterone Receptor (PgR) Status: POSITIVE, >90% nuclear staining      Average intensity of staining: Strong HER2 (by immunohistochemistry): EQUIVOCAL, 2+ Percentage of cells with uniform intense complete membrane staining: 0%  METHODS Cold Ischemia and Fixation Times: Meet requirements specified in latest version of the ASCO/CAP  guidelines Testing Performed on Block Number(s): A1 Fixative: Formalin Estrogen Receptor:  FDA cleared ([place])                    Primary Antibody:  SP1 Progesterone Receptor: FDA cleared ([place])                   Primary Antibody: 1E2 HER2 (by immunohistochemistry): FDA approved ([place])                             Primary Antibody: HercepTest  Immunohistochemistry controls worked appropriately. Slides were prepared by [facility] for [facility], [place], [place], and interpreted by Dr. Luana Shu.      Addendum #1 performed by Delorse Lek, MD.  Electronically signed 07/15/2017 1:36:03PM    Technical component performed at [place], [address], [place], [place] [address] Lab: [phone] Dir: Darrick Penna. Evette Doffing, MD  Professional component performed at [facility], [facility], [place], [place], [place] [address] Lab: [phone] Dir: Dellia Nims. Rubinas, MD      Assessment:  KAYREN HOLCK is a 83 y.o. female with a history of stage I left breast cancer (2000) and left axillary metastasis (2018).  She has a history of left breast cancer s/p wide excision and sentinel lymph node biopsy on 07/18/1999 at [facility].  Pathology revealed a 0.8 x 0.6 x 0.4 cm grade II invasive ductal adenocarcinoma.  There was no lymphovascular invasion.  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  Margins were negative.  Tumor was ER + (50%) and PR + (90%).  Pathologic stage was T1bN0.  She received 6200 cGy from 08/21/1999 - 10/03/1999.  She completed 5 years of tamoxifen in 06/2004.     Screening mammogram on 06/19/2017 revealed scattered areas of fibroglandular density.  In the left axillae, there was possible adenopathy.  Left breast ultrasound on 07/01/2017 revealed a 1.3 x 1.1 x 1.0 cm left axillary lobulated mass.  Left axillary biopsy revealed 1.1 cm grade II invasive mammary carcinoma with focal lobular features.   Wide excision on 08/20/2017 revealed metastatic mammary carcinoma involving 1 of 4 axillary lymph nodes with extracapsular extension.  The superior medial margin was involved.  Tumor was ER + (> 90%), PR + (> 90%), and Her2/neu 2+.  Pathologic stage was TxN1a.  CA27.29 has been followed:  14.7 on 02/05/2017 and 19.1 on 02/06/2012.  Bone density on 09/01/2017 revealed osteopenia with a T-score of -1.9 in the right femoral neck and left forearm.   Symptomatically, she notes chronic left hip and knee issues.  Exam reveals a post-surgical changes in the left axillae.   Plan: 1.  Discuss interval surgery.  Tumor was in a lymph node rather than breast tissue.  Discuss further management.  Discuss standard of care (mastectomy).  Patient not interested in surgery.  Discuss age related issues.  Discuss consideration of hormonal therapy.  Discus tamoxifen versus aromatase inhibitors.  Side effects reviewed.  Discuss risk of thrombosis (tamoxifen 5% versus AI <= 3%).  Discuss bone thinning associated with aromatase inhibitors (osteoporosis 5-15%). Information on tamoxifen and Femara provided. 2.  Baseline labs (CBC with diff, CMP, CA27.29) today.   3.  Discuss baseline PET scan.  Patient agrees to imaging study.  Will schedule for 09/11/2017.  4.  Discuss osteopenia.  Discuss calcium 1200 mg/day, vitamin D 800 IU and consideration of Prolia if she pursues an aromatase inhibitor. Discuss Prolia injections to prevent bone thinning. She will need dental clearance prior to starting Prolia.   Information on Prolia provided. Patient sees Dr. Kennis Carina (dentist) in [place] - [phone]. 5.  Preauth Prolia. 6.  RTC after PET scan for MD assessment, review labs and PET scan, and initiation of hormonal therapy.   Honor Loh, NP  09/04/2017, 2:41 PM   I saw and evaluated the patient, participating in the key portions of the service and reviewing pertinent diagnostic studies and records.  I reviewed the nurse practitioner's note and agree with the findings and the plan.  The assessment and plan were discussed with the patient.  Additional diagnostic studies of a PET scan are needed to clarify stage of disease and would serve as a new baseline prior to initiation of therapy.  Multiple questions were asked by the patient and answered.   Lequita Asal, MD 09/04/2017,5:48 PM

## 2017-09-05 LAB — CANCER ANTIGEN 27.29: CA 27.29: 172.2 U/mL — ABNORMAL HIGH (ref 0.0–38.6)

## 2017-09-08 ENCOUNTER — Other Ambulatory Visit: Payer: Self-pay | Admitting: Internal Medicine

## 2017-09-08 ENCOUNTER — Telehealth: Payer: Self-pay | Admitting: *Deleted

## 2017-09-08 ENCOUNTER — Other Ambulatory Visit: Payer: Self-pay | Admitting: Hematology and Oncology

## 2017-09-08 DIAGNOSIS — M8580 Other specified disorders of bone density and structure, unspecified site: Secondary | ICD-10-CM | POA: Insufficient documentation

## 2017-09-08 DIAGNOSIS — M85851 Other specified disorders of bone density and structure, right thigh: Secondary | ICD-10-CM

## 2017-09-08 NOTE — Telephone Encounter (Signed)
Called patient's daughter who had questions regarding her mom's last clinic visit.  Patient does have appt with Dr. Bary Castilla on 09-10-17.  Explained to [facility] that patient needs dental clearance for Prolia injections.  It has been approved by her insurance.  Just waiting for dental clearance.  Called dentist office and they were to fax info to me on Friday but as of today it has not been received.

## 2017-09-08 NOTE — Telephone Encounter (Signed)
-----   Message from Secundino Ginger sent at 09/05/2017  9:59 AM EDT ----- Regarding: Kelli Williams August 16, 1926 Contact: [phone] Maribeth,( daughter) has some info for you( wed 10/3 Kelli has an appt 11 am Dr Bary Castilla) and wanted to follow up about her mom's last appt

## 2017-09-08 NOTE — Telephone Encounter (Signed)
Per Kelli Williams, patient good for Prolia.  No authorization required.

## 2017-09-08 NOTE — Telephone Encounter (Signed)
  Orders are in.  OK to schedule Prolia with BMP if we have received dental clearance from:  Dr. Kennis Carina (dentist) in [place] - [phone].  M

## 2017-09-09 ENCOUNTER — Telehealth: Payer: Self-pay | Admitting: *Deleted

## 2017-09-09 NOTE — Telephone Encounter (Signed)
Patients daughter called stating that Kelli Williams has an appointment with Dr.Byrnett tomorrow 09/10/17 and the last time she saw Dr.Byrnett he stated that he would like her daughter to come to her next appointment. [name] (Daughter) is unable to come tomorrow so she would like to know what he is going to talk about.

## 2017-09-10 ENCOUNTER — Encounter: Payer: Self-pay | Admitting: General Surgery

## 2017-09-10 ENCOUNTER — Ambulatory Visit (INDEPENDENT_AMBULATORY_CARE_PROVIDER_SITE_OTHER): Payer: Medicare Other | Admitting: General Surgery

## 2017-09-10 VITALS — BP 176/82 | HR 71 | Resp 14 | Ht 66.0 in | Wt 202.0 lb

## 2017-09-10 DIAGNOSIS — Z170 Estrogen receptor positive status [ER+]: Secondary | ICD-10-CM

## 2017-09-10 DIAGNOSIS — C50412 Malignant neoplasm of upper-outer quadrant of left female breast: Secondary | ICD-10-CM

## 2017-09-10 NOTE — Patient Instructions (Signed)
Continue to use heat to the area as needed.

## 2017-09-10 NOTE — Progress Notes (Signed)
Patient ID: PAT ELICKER, female   DOB: 07/13/1926, 83 y.o.   MRN: [id number]  Chief Complaint  Patient presents with  . Routine Post Op    HPI Kelli Williams is a 83 y.o. female here for post op left breast wide excision done on 08/20/17. She reports pain in the surgical area with a lump in her right axilla that she is concerned about. She says the pain is improved and the lump is getting smaller. She has been using heat.  HPI  Past Medical History:  Diagnosis Date  . Anxiety   . Arthritis    BOTH FEET AND KNEES  . Breast cancer ([place]) 2000   left breast ca with lumpectomy and rad tx 5 yr tamoxifen/Dr Jeannine Kitten at [place]  . Complication of anesthesia    WOKE UP DURING SURGERY FOR DEVIATED SEPTUM, KNEE REPLACEMENT AND BREAST LUMPECTOMY  . Dysrhythmia   . GERD (gastroesophageal reflux disease)   . Hypertension   . Neuropathic pain of both legs   . Neuropathy   . Pneumonia 2016  . Skin cancer of forehead   . Skin cancer of nose     Past Surgical History:  Procedure Laterality Date  . ABDOMINAL HYSTERECTOMY    . AXILLARY LYMPH NODE BIOPSY Left 07/10/2017   INVASIVE MAMMARY CARCINOMA WITH FOCAL LOBULAR FEATURES.   Marland Kitchen BREAST BIOPSY Bilateral 1960   benign  . BREAST LUMPECTOMY Left 2000   breast ca with rad tx  . BREAST LUMPECTOMY Left 08/20/2017  . BREAST LUMPECTOMY Left 08/20/2017   Procedure: left breast wide excision;  Surgeon: Robert Bellow, MD;  Location: [facility] ORS;  Service: General;  Laterality: Left;  . CATARACT EXTRACTION     left eye  . CHOLECYSTECTOMY    . JOINT REPLACEMENT    . NASAL SEPTUM SURGERY    . PARTIAL KNEE ARTHROPLASTY     right  . TOTAL VAGINAL HYSTERECTOMY      Family History  Problem Relation Age of Onset  . Heart disease Mother   . Heart attack Paternal Uncle   . Breast cancer Daughter 23       genetic negative  . Cancer Paternal Grandmother     Social History Social History  Substance Use Topics  . Smoking status: Never Smoker  .  Smokeless tobacco: Never Used  . Alcohol use No    Allergies  Allergen Reactions  . Amitriptyline Hives  . Amoxicillin     Diarrhea    . Ivp Dye [Iodinated Diagnostic Agents]   . Sulfa Antibiotics Rash  . Sulfa Antibiotics Rash    Current Outpatient Prescriptions  Medication Sig Dispense Refill  . acetaminophen (TYLENOL) 325 MG tablet Take 650 mg by mouth every 6 (six) hours as needed.    Marland Kitchen aspirin 81 MG tablet Take 1 tablet by mouth daily.    . Calcium-Magnesium 500-250 MG TABS Take 1 tablet by mouth daily.     . Coenzyme Q10 (CO Q-10) 100 MG CAPS Take by mouth daily.    Marland Kitchen gabapentin (NEURONTIN) 300 MG capsule Take 3 capsules (900 mg total) by mouth 2 (two) times daily. 540 capsule 3  . lisinopril-hydrochlorothiazide (PRINZIDE,ZESTORETIC) 20-25 MG tablet TAKE 1 AND 1/2 TABLETS BY  MOUTH DAILY 135 tablet 3  . meloxicam (MOBIC) 15 MG tablet Take 1 tablet (15 mg total) by mouth daily. 90 tablet 1  . Multiple Vitamins-Minerals (MULTIVITAMIN WITH MINERALS) tablet Take 1 tablet by mouth daily.    Marland Kitchen omeprazole (PRILOSEC)  20 MG capsule Take 1 capsule (20 mg total) by mouth daily. (Patient taking differently: Take 20 mg by mouth every morning. ) 90 capsule 1  . propranolol (INDERAL) 10 MG tablet TAKE 1 TABLET BY MOUTH UP  TO 3 TIMES DAILY AS NEEDED 270 tablet 1   No current facility-administered medications for this visit.     Review of Systems Review of Systems  Constitutional: Negative.   Respiratory: Negative.   Cardiovascular: Negative.     Blood pressure (!) 176/82, pulse 71, resp. rate 14, height 5\' 6"  (1.676 m), weight 202 lb (91.6 kg).  Physical Exam Physical Exam  Constitutional: She is oriented to person, place, and time. She appears well-developed and well-nourished.  Pulmonary/Chest:    Neurological: She is alert and oriented to person, place, and time.  Skin: Skin is warm and dry.  Psychiatric: She has a normal mood and affect.    Data Reviewed Medical  oncology notes of 09/04/2017 reviewed.  Bone density results of 924, 2018 reviewed.  PET scan pending for 07/17/2017.    Assessment    Recurrent left breast cancer with axillary metastatic disease.  Elevated CA 27-29 at 172. Prior recorded value on 02/06/2016 was 14.9.    Plan    The patient was concerned about "contrast" during PET scan. Reviewed that she will not be receiving iodinated contrast.  If no metastatic disease is identified (his outside of the axilla), the patient still may be a good candidate for salvage mastectomy and axillary dissection. If widespread metastatic disease is identified, antiestrogen therapy alone would be appropriate.    HPI, Physical Exam, Assessment and Plan have been scribed under the direction and in the presence of Robert Bellow, MD  Kelli Living, LPN  I have completed the exam and reviewed the above documentation for accuracy and completeness.  I agree with the above.  Haematologist has been used and any errors in dictation or transcription are unintentional.  Hervey Ard, M.D., F.A.C.S.  Robert Bellow 09/11/2017, 7:39 AM

## 2017-09-12 ENCOUNTER — Inpatient Hospital Stay: Payer: Medicare Other | Admitting: Hematology and Oncology

## 2017-09-12 ENCOUNTER — Telehealth: Payer: Self-pay | Admitting: Urgent Care

## 2017-09-12 ENCOUNTER — Inpatient Hospital Stay: Payer: Medicare Other

## 2017-09-15 ENCOUNTER — Encounter: Payer: Self-pay | Admitting: *Deleted

## 2017-09-15 NOTE — Progress Notes (Signed)
  Oncology Nurse Navigator Documentation  Navigator Location: [facility] (09/15/17 1300)   )Navigator Encounter Type: Telephone (09/15/17 1300) Telephone: [place] Call (09/15/17 1300)                       Barriers/Navigation Needs: Education (09/15/17 1300) Education: Other (09/15/17 1300)                        Time Spent with Patient: 30 (09/15/17 1300)   Patient called and spoke with Hassan Rowan the triage nurse.  She had questions about her PET scan tomorrow.  Hassan Rowan asked me to give the patient a call.  Patient had questions about taking her Lisinopril tomorrow.  She is afraid she will have to stay still for "an hour and a half".  Confirmed with Abby in nuc med, the patient will have to empty her bladder prior to the scan and is on under the camera for 30 minutes.  Explained procedure to the patient.  States her daughter is on vacation and she is having a driver pick her up and take her back tomorrow.  She has asked that I come check on her after her procedure.  Discussed with Abby in nuc med to please call me when she is finished and I will come up and check on the patient and assist with getting her driver to pick her up.  Abby is agreeable.

## 2017-09-16 ENCOUNTER — Ambulatory Visit
Admission: RE | Admit: 2017-09-16 | Discharge: 2017-09-16 | Disposition: A | Discharge status: Home / Self Care | Payer: Medicare Other | Source: Ambulatory Visit | Attending: Urgent Care | Admitting: Urgent Care

## 2017-09-16 ENCOUNTER — Ambulatory Visit: Payer: Medicare Other | Admitting: Hematology and Oncology

## 2017-09-16 ENCOUNTER — Other Ambulatory Visit: Payer: Medicare Other

## 2017-09-16 ENCOUNTER — Other Ambulatory Visit: Payer: Self-pay | Admitting: Hematology and Oncology

## 2017-09-16 ENCOUNTER — Encounter: Payer: Self-pay | Admitting: *Deleted

## 2017-09-16 DIAGNOSIS — Z170 Estrogen receptor positive status [ER+]: Principal | ICD-10-CM

## 2017-09-16 DIAGNOSIS — I2510 Atherosclerotic heart disease of native coronary artery without angina pectoris: Secondary | ICD-10-CM | POA: Insufficient documentation

## 2017-09-16 DIAGNOSIS — C7951 Secondary malignant neoplasm of bone: Secondary | ICD-10-CM | POA: Insufficient documentation

## 2017-09-16 DIAGNOSIS — I517 Cardiomegaly: Secondary | ICD-10-CM | POA: Diagnosis not present

## 2017-09-16 DIAGNOSIS — K449 Diaphragmatic hernia without obstruction or gangrene: Secondary | ICD-10-CM | POA: Insufficient documentation

## 2017-09-16 DIAGNOSIS — C50412 Malignant neoplasm of upper-outer quadrant of left female breast: Secondary | ICD-10-CM

## 2017-09-16 DIAGNOSIS — C50912 Malignant neoplasm of unspecified site of left female breast: Secondary | ICD-10-CM | POA: Diagnosis not present

## 2017-09-16 DIAGNOSIS — R590 Localized enlarged lymph nodes: Secondary | ICD-10-CM | POA: Diagnosis not present

## 2017-09-16 DIAGNOSIS — I700 Atherosclerosis of aorta: Secondary | ICD-10-CM | POA: Diagnosis not present

## 2017-09-16 DIAGNOSIS — K5730 Diverticulosis of large intestine without perforation or abscess without bleeding: Secondary | ICD-10-CM | POA: Diagnosis not present

## 2017-09-16 LAB — GLUCOSE, CAPILLARY: Glucose-Capillary: 92 mg/dL (ref 65–99)

## 2017-09-16 MED ORDER — FLUDEOXYGLUCOSE F - 18 (FDG) INJECTION
12.3000 | Freq: Once | INTRAVENOUS | Status: AC | PRN
  Administered 2017-09-16: 12.3 via INTRAVENOUS

## 2017-09-16 NOTE — Progress Notes (Signed)
  Oncology Nurse Navigator Documentation  Navigator Location: [facility] (09/16/17 1300)   )Navigator Encounter Type: Other (PET SCAN) (09/16/17 1300)                         Barriers/Navigation Needs:  (support) (09/16/17 1300)                          Time Spent with Patient: 60 (09/16/17 1300)   Patient requested that meet her today after her PET scan to assist her with calling her ride and getting to the entrance to be picked up.  I met her in nuc med and stayed with her until her ride from the [place] picked her up.  Offered support.

## 2017-09-17 ENCOUNTER — Other Ambulatory Visit: Payer: Self-pay | Admitting: Urgent Care

## 2017-09-18 ENCOUNTER — Ambulatory Visit: Payer: Medicare Other | Admitting: Hematology and Oncology

## 2017-09-18 ENCOUNTER — Other Ambulatory Visit: Payer: Medicare Other

## 2017-09-19 ENCOUNTER — Other Ambulatory Visit: Payer: Self-pay | Admitting: Urgent Care

## 2017-09-19 DIAGNOSIS — C7951 Secondary malignant neoplasm of bone: Secondary | ICD-10-CM

## 2017-09-22 ENCOUNTER — Telehealth: Payer: Self-pay | Admitting: General Surgery

## 2017-09-22 ENCOUNTER — Telehealth: Payer: Self-pay | Admitting: *Deleted

## 2017-09-22 NOTE — Telephone Encounter (Signed)
Reviewed results of the recently completed PET scan: Wide spread bony metastatic disease w/ extensive involvement of both femurs putting her at risk for fracture.  Patient has appt w/ Dr. Mike Gip tomorrow.  Have encourage daughter to be present. Possible role of RT to femur areas reviewed.

## 2017-09-22 NOTE — Telephone Encounter (Signed)
Patient's daughter, Olean Ree [facility], called the office and Saturday 09-20-17 returning a call from Dr. Bary Castilla.   I spoke with the daughter this morning and she would like to know the results from her mom's recent PET scan and also wanted to see if she missed any important information from her last office visit.

## 2017-09-23 ENCOUNTER — Other Ambulatory Visit: Payer: Self-pay | Admitting: Hematology and Oncology

## 2017-09-23 ENCOUNTER — Inpatient Hospital Stay: Payer: Medicare Other

## 2017-09-23 ENCOUNTER — Inpatient Hospital Stay: Payer: Medicare Other | Attending: Hematology and Oncology | Admitting: Hematology and Oncology

## 2017-09-23 ENCOUNTER — Ambulatory Visit
Admission: RE | Admit: 2017-09-23 | Discharge: 2017-09-23 | Disposition: A | Discharge status: Home / Self Care | Payer: Medicare Other | Source: Ambulatory Visit | Attending: Hematology and Oncology | Admitting: Hematology and Oncology

## 2017-09-23 ENCOUNTER — Ambulatory Visit
Admission: RE | Admit: 2017-09-23 | Discharge: 2017-09-23 | Disposition: A | Discharge status: Home / Self Care | Payer: Medicare Other | Source: Ambulatory Visit | Attending: Urgent Care | Admitting: Urgent Care

## 2017-09-23 ENCOUNTER — Encounter: Payer: Self-pay | Admitting: Hematology and Oncology

## 2017-09-23 VITALS — BP 155/73 | HR 66 | Temp 94.7°F | Resp 18 | Wt 200.7 lb

## 2017-09-23 DIAGNOSIS — Z7982 Long term (current) use of aspirin: Secondary | ICD-10-CM | POA: Insufficient documentation

## 2017-09-23 DIAGNOSIS — C7951 Secondary malignant neoplasm of bone: Secondary | ICD-10-CM | POA: Insufficient documentation

## 2017-09-23 DIAGNOSIS — I10 Essential (primary) hypertension: Secondary | ICD-10-CM | POA: Insufficient documentation

## 2017-09-23 DIAGNOSIS — I2510 Atherosclerotic heart disease of native coronary artery without angina pectoris: Secondary | ICD-10-CM | POA: Insufficient documentation

## 2017-09-23 DIAGNOSIS — K219 Gastro-esophageal reflux disease without esophagitis: Secondary | ICD-10-CM | POA: Insufficient documentation

## 2017-09-23 DIAGNOSIS — Z9221 Personal history of antineoplastic chemotherapy: Secondary | ICD-10-CM | POA: Insufficient documentation

## 2017-09-23 DIAGNOSIS — I700 Atherosclerosis of aorta: Secondary | ICD-10-CM | POA: Diagnosis not present

## 2017-09-23 DIAGNOSIS — Z853 Personal history of malignant neoplasm of breast: Secondary | ICD-10-CM | POA: Diagnosis not present

## 2017-09-23 DIAGNOSIS — Z170 Estrogen receptor positive status [ER+]: Secondary | ICD-10-CM

## 2017-09-23 DIAGNOSIS — M79651 Pain in right thigh: Secondary | ICD-10-CM | POA: Diagnosis not present

## 2017-09-23 DIAGNOSIS — K449 Diaphragmatic hernia without obstruction or gangrene: Secondary | ICD-10-CM | POA: Insufficient documentation

## 2017-09-23 DIAGNOSIS — F419 Anxiety disorder, unspecified: Secondary | ICD-10-CM | POA: Diagnosis not present

## 2017-09-23 DIAGNOSIS — Z79899 Other long term (current) drug therapy: Secondary | ICD-10-CM | POA: Insufficient documentation

## 2017-09-23 DIAGNOSIS — Z85828 Personal history of other malignant neoplasm of skin: Secondary | ICD-10-CM | POA: Diagnosis not present

## 2017-09-23 DIAGNOSIS — Z9842 Cataract extraction status, left eye: Secondary | ICD-10-CM | POA: Insufficient documentation

## 2017-09-23 DIAGNOSIS — Z923 Personal history of irradiation: Secondary | ICD-10-CM | POA: Insufficient documentation

## 2017-09-23 DIAGNOSIS — M79652 Pain in left thigh: Secondary | ICD-10-CM | POA: Diagnosis not present

## 2017-09-23 DIAGNOSIS — C50412 Malignant neoplasm of upper-outer quadrant of left female breast: Secondary | ICD-10-CM

## 2017-09-23 DIAGNOSIS — Z9223 Personal history of estrogen therapy: Secondary | ICD-10-CM | POA: Diagnosis not present

## 2017-09-23 HISTORY — DX: Secondary malignant neoplasm of bone: C79.51

## 2017-09-23 NOTE — Progress Notes (Signed)
[facility] day:  09/23/2017  Chief Complaint: Kelli Williams is a 83 y.o. female with a history of stage I left breast cancer (2000) who is seen for review of interval PET scan and discussion regarding direction of therapy.  HPI:  The patient was last seen in the medical oncology clinic on 09/04/2017.  At that time, she noted chronic left hip and knee issues.  Exam revealed post-surgical changes in the left axillae.  Interval surgery had revealed tumor in a lymph node rather than breast tissue.  She was not interested in surgery.  We discussed consideration of hormonal therapy.  We discussed tamoxifen versus aromatase inhibitors.  Side effects were reviewed.  We discussed consideration of Prolia for osteopenia if she wished to pursue an aromatase inhibitor.  Information was provided.  Labs at last visit included a normal CBC with diff and CMP.  CA27.29 was 172.2.  Baseline PET scan was ordered.  PET scan on 09/16/2017 revealed widespread scattered hypermetabolic lytic osseous metastases throughout the axial and proximal appendicular skeleton.  Bilateral proximal femoral skeletal metastases placed the patient at risk for pathologic hip fractures.  There was mild hypermetabolic right hilar lymphadenopathy.  There was postoperative seromas in the left axilla.  Chronic findings included aortic atherosclerosis, coronary atherosclerosis, mild cardiomegaly, small hiatal hernia and moderate colonic diverticulosis.  Plain films of the bilateral  femurs were performed today. Neither film demonstrated evidence of a pathologic fracture. The LEFT view showed lytic subtrochanteric metastasis that was previously seen on PET scan.   Symptomatically, she is doing well. She notes continued chronic left hip and knee issues. Patient denies any B symptoms and interval infections. Patient eating well. She has lost 2 pounds since her last visit.    Past Medical History:   Diagnosis Date  . Anxiety   . Arthritis    BOTH FEET AND KNEES  . Breast cancer ([place]) 2000   left breast ca with lumpectomy and rad tx 5 yr tamoxifen/Dr Jeannine Kitten at [place]  . Complication of anesthesia    WOKE UP DURING SURGERY FOR DEVIATED SEPTUM, KNEE REPLACEMENT AND BREAST LUMPECTOMY  . Dysrhythmia   . GERD (gastroesophageal reflux disease)   . Hypertension   . Neuropathic pain of both legs   . Neuropathy   . Pneumonia 2016  . Skin cancer of forehead   . Skin cancer of nose     Past Surgical History:  Procedure Laterality Date  . ABDOMINAL HYSTERECTOMY    . AXILLARY LYMPH NODE BIOPSY Left 07/10/2017   INVASIVE MAMMARY CARCINOMA WITH FOCAL LOBULAR FEATURES.   Marland Kitchen BREAST BIOPSY Bilateral 1960   benign  . BREAST LUMPECTOMY Left 2000   breast ca with rad tx  . BREAST LUMPECTOMY Left 08/20/2017  . BREAST LUMPECTOMY Left 08/20/2017   Procedure: left breast wide excision;  Surgeon: Robert Bellow, MD;  Location: [facility] ORS;  Service: General;  Laterality: Left;  . CATARACT EXTRACTION     left eye  . CHOLECYSTECTOMY    . JOINT REPLACEMENT    . NASAL SEPTUM SURGERY    . PARTIAL KNEE ARTHROPLASTY     right  . TOTAL VAGINAL HYSTERECTOMY      Family History  Problem Relation Age of Onset  . Heart disease Mother   . Heart attack Paternal Uncle   . Breast cancer Daughter 70       genetic negative  . Cancer Paternal Grandmother     Social History:  reports that she has never smoked. She has never used smokeless tobacco. She reports that she does not drink alcohol or use drugs.  The patient's husband died 2 years ago.  She was born outside of [place].  She previously worked as an Environmental health practitioner.  She lives at the [place] [facility] [place].  She is independent except for the use of her walker.  The patient is accompanied by her daughter, Kelli Williams, today.  Allergies:  Allergies  Allergen Reactions  . Amitriptyline Hives  . Amoxicillin     Diarrhea    . Ivp Dye  [Iodinated Diagnostic Agents]   . Sulfa Antibiotics Rash  . Sulfa Antibiotics Rash    Current Medications: Current Outpatient Prescriptions  Medication Sig Dispense Refill  . aspirin 81 MG tablet Take 1 tablet by mouth daily.    . Calcium-Magnesium 500-250 MG TABS Take 1 tablet by mouth daily.     . Coenzyme Q10 (CO Q-10) 100 MG CAPS Take by mouth daily.    Marland Kitchen gabapentin (NEURONTIN) 300 MG capsule Take 3 capsules (900 mg total) by mouth 2 (two) times daily. 540 capsule 3  . lisinopril-hydrochlorothiazide (PRINZIDE,ZESTORETIC) 20-25 MG tablet TAKE 1 AND 1/2 TABLETS BY  MOUTH DAILY 135 tablet 3  . meloxicam (MOBIC) 15 MG tablet Take 1 tablet (15 mg total) by mouth daily. 90 tablet 1  . Multiple Vitamins-Minerals (MULTIVITAMIN WITH MINERALS) tablet Take 1 tablet by mouth daily.    Marland Kitchen omeprazole (PRILOSEC) 20 MG capsule Take 1 capsule (20 mg total) by mouth daily. (Patient taking differently: Take 20 mg by mouth every morning. ) 90 capsule 1  . acetaminophen (TYLENOL) 325 MG tablet Take 650 mg by mouth every 6 (six) hours as needed.    . propranolol (INDERAL) 10 MG tablet TAKE 1 TABLET BY MOUTH UP  TO 3 TIMES DAILY AS NEEDED (Patient not taking: Reported on 09/23/2017) 270 tablet 1   No current facility-administered medications for this visit.     Review of Systems:  GENERAL:  Feels "ok".  No fevers or sweats.  Weight down 2 pounds.  PERFORMANCE STATUS (ECOG):  2 HEENT:  No visual changes, runny nose, sore throat, mouth sores or tenderness. Lungs: No shortness of breath or cough.  No hemoptysis. Cardiac:  No chest pain, palpitations, orthopnea, or PND. GI:  Irritable bowel exacerbated by nerves.  No nausea, vomiting,constipation, melena or hematochezia. GU:  No urgency, frequency, dysuria, or hematuria. Musculoskeletal:  Left hip and knee problems (chronic).  Right middle finger arthritis.  No muscle tenderness. Extremities:  No pain or swelling. Skin:  No rashes or skin changes. Neuro:   No headache, numbness or weakness, balance or coordination issues. Endocrine:  No diabetes, thyroid issues, hot flashes or night sweats. Psych:  No mood changes, depression or anxiety. Pain:  Pain in left hip and knees (3 out of 10). Review of systems:  All other systems reviewed and found to be negative.  Physical Exam: Blood pressure (!) 155/73, pulse 66, temperature (!) 94.7 F (34.8 C), temperature source Tympanic, resp. rate 18, weight 200 lb 11.2 oz (91 kg). GENERAL:  Elderly woman sitting comfortably in the exam room in no acute distress.  She has a walker by her side. MENTAL STATUS:  Alert and oriented to person, place and time. HEAD:  Wallace Cullens short styled hair.  Normocephalic, atraumatic, face symmetric, no Cushingoid features. EYES:  Hazel eyes.  No conjunctivitis or scleral icterus. EXTREMITIES: Chronic lower extremity changes.  No skin discoloration or  tenderness.  No palpable cords. NEUROLOGICAL: Unremarkable. PSYCH:  Appropriate.   No visits with results within 3 Day(s) from this visit.  Latest known visit with results is:  Hospital Outpatient Visit on 07/10/2017  Component Date Value Ref Range Status  . SURGICAL PATHOLOGY 07/10/2017    Final-Edited                   Value:Surgical Pathology THIS IS AN ADDENDUM REPORT CASE: [id number] PATIENT: [facility] Surgical Pathology Report Addendum   Reason for Addendum #1:  Additional clinical/test information  SPECIMEN SUBMITTED: A. Axilla, left  CLINICAL HISTORY: Left axillary mass in a patient with history of prior ipsilateral breast cancer  PRE-OPERATIVE DIAGNOSIS: Metastatic disease vs second primary cancer  POST-OPERATIVE DIAGNOSIS: None provided.     DIAGNOSIS: A.  LEFT AXILLA; ULTRASOUND-GUIDED NEEDLE CORE BIOPSY: - INVASIVE MAMMARY CARCINOMA WITH FOCAL LOBULAR FEATURES.  Size of invasive carcinoma:  1.1 cm in this sample Histologic grade of invasive carcinoma: Grade 2      Glandular/tubular  differentiation score: 3      Nuclear pleomorphism score: 2      Mitotic rate score: 1  ER/PR/HER2: Immunohistochemistry will be performed, with reflex to [place] for HER2 2+ (equivocal) results. The results will be reported in an addendum.  The                          diagnosis was called to Eino Farber of [facility] on 07/11/17 at 227 PM.   GROSS DESCRIPTION:  A. The specimen is received in a formalin-filled container labeled with the patient's name and left axillary mass.  Core pieces: multiple, 2.0 x 0.9 x 0.1 cm Comments: yellow to red lobulated fibrofatty, marked green  Entirely submitted in cassette(s): 1  Time/Date in fixative: placed in formalin at 1:50 PM on 07/10/17 cold ischemic time of less than 5 minutes Total fixation time: 6.5 hours    Final Diagnosis performed by Delorse Lek, MD.  Electronically signed 07/11/2017 2:29:14PM   The electronic signature indicates that the named Attending Pathologist has evaluated the specimen  Technical component performed at [facility], [address], [place], [place] [address] Lab: [phone] Dir: Darrick Penna. Evette Doffing, MD  Professional component performed at [name], [facility], [place], [place], [place] [address] Lab: [phone]                          Dir: Dellia Nims. Rubinas, MD   Breast Biomarker Reporting Template  BREAST BIOMARKER TESTS Estrogen Receptor (ER) Status: POSITIVE, >90% nuclear staining      Average intensity of staining: Strong Progesterone Receptor (PgR) Status: POSITIVE, >90% nuclear staining      Average intensity of staining: Strong HER2 (by immunohistochemistry): EQUIVOCAL, 2+ Percentage of cells with uniform intense complete membrane staining: 0%  METHODS Cold Ischemia and Fixation Times: Meet requirements specified in latest version of the ASCO/CAP guidelines Testing Performed on Block Number(s): A1 Fixative: Formalin Estrogen Receptor:  FDA  cleared ([place])                    Primary Antibody:  SP1 Progesterone Receptor: FDA cleared (Ventana)                   Primary Antibody: 1E2 HER2 (by immunohistochemistry): FDA approved (DAKO)  Primary Antibody: HercepTest  Immunohistochemistry controls worked appropriately. Slides were prepared by [facility] for [facility], [place], [place], and interpreted by Dr. Luana Shu.      Addendum #1 performed by Delorse Lek, MD.  Electronically signed 07/15/2017 1:36:03PM    Technical component performed at [facility], [address], [place], [place] [address] Lab: [phone] Dir: Darrick Penna. Evette Doffing, MD  Professional component performed at [facility], [facility], [place], [place], [place] [address] Lab: [phone] Dir: Dellia Nims. Rubinas, MD      Assessment:  Kelli Williams is a 82 y.o. female with metastatic breast cancer s/p left axillary biopsy on 07/10/2017.  Pathology revealed a 1.1 cm grade II invasive mammary carcinoma with focal lobular features.   Wide excision on 08/20/2017 revealed metastatic mammary carcinoma involving 1 of 4 axillary lymph nodes with extracapsular extension.  The superior medial margin was involved.  Tumor was ER + (> 90%), PR + (> 90%), and Her2/neu 2+ (FISH -).   She has a history of left breast cancer s/p wide excision and sentinel lymph node biopsy on 07/18/1999 at [facility].  Pathology revealed a 0.8 x 0.6 x 0.4 cm grade II invasive ductal adenocarcinoma.  There was no lymphovascular invasion.  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  Margins were negative.  Tumor was ER + (50%) and PR + (90%).  Pathologic stage was T1bN0.  She received 6200 cGy from 08/21/1999 - 10/03/1999.  She completed 5 years of tamoxifen in 06/2004.    PET scan on 09/16/2017 revealed widespread scattered hypermetabolic lytic osseous metastases throughout the axial and proximal  appendicular skeleton.  Bilateral proximal femoral skeletal metastases placed the patient at risk for pathologic hip fractures.  There was mild hypermetabolic right hilar lymphadenopathy.  Plain films of the bilateral femurs on 09/23/2017 revealed no pathologic fracture. The LEFT view showed lytic subtrochanteric metastasis that was previously seen on PET scan.   CA27.29 has been followed:  14.7 on 02/05/2017, 19.1 on 02/06/2012, and 172.2 on 09/04/2017.  Bone density on 09/01/2017 revealed osteopenia with a T-score of -1.9 in the right femoral neck and left forearm.   Symptomatically, she notes chronic left hip and knee issues.  Exam reveals a post-surgical changes in the left axillae.  Plan: 1.  Discuss interval PET scan- widespread osseous metastatic disease.  Discuss potential risk for pathologic fracture in bilateral proximal femurs.  Discuss plain films and referral to radiation oncology. 2.  Discuss monthly Xgeva for prevention of bone related events.  Confirm dental clearance from Dr. Kennis Carina (dentist) in [place] - [phone].  Continue calcium '1200mg'$  and vitamin D 800 IU daily. Printed information provided on Xgeva today.  3.  Discuss treatment options with tamoxifen, aromatase inhibitor (Femara), or estrogen receptor antagonist (Faslodex). Side effects of medications reviewed with patient. Discuss bone thinning associated with aromatase inhibitors (osteoporosis 5-15%). Discuss risk of thrombosis (tamoxifen 5% versus AI <= 3%).  Discuss studies:  Faslodex vs Arimidex (FALCON trial PFS 17 months vs 14 months), Ibrance + Femara vs Femara alone (PFS 25 months vs 15 months), and Faslodex + Kisqali  vs Faslodex alone (MONALEESA-3 trial PFS 21 months vs 13 months).  Printed information provided on Faslodex today. 4. Discuss the potential use of a CDK 4/6 inhibitor (Ibrance or Kisqali) with hormonal therapy. Educated patient on increased risk  of myelosuppression, elevated liver function tested,  and possible prolonged QTc syndrome associated with the use of Kisqali. We discussed monitoring of labs and EKG while on therapy. Printed information provided on [place] today.  Patient and daughter leaning toward Faslodex alone at this time. 5.  Refer to radiation oncology for evaluation and treatment.  6.  Preauthorize Xgeva and Faslodex. 7.  RTC on 09/26/2017 for labs (CBC with diff, CMP, BRCA 1/2 testing, SPEP, FLCA), Faslodex, and Xgeva. 8.  RTC in 2 weeks for MD assessment, labs (BMP) and completion of Faslodex load.   Honor Loh, NP  09/23/2017, 12:56 PM   I saw and evaluated the patient, participating in the key portions of the service and reviewing pertinent diagnostic studies and records.  I reviewed the nurse practitioner's note and agree with the findings and the plan.  The assessment and plan were discussed with the patient.  Multiple questions were asked by the patient and answered.   Lequita Asal, MD 09/23/2017,12:56 PM

## 2017-09-23 NOTE — Progress Notes (Signed)
Here for follow up

## 2017-09-23 NOTE — Progress Notes (Signed)
START OFF PATHWAY REGIMEN - Breast   [id number]:Fulvestrant (Faslodex) 500 mg q28 days:   A cycle is every 28 days:     Fulvestrant      Fulvestrant   **Always confirm dose/schedule in your pharmacy ordering system**    Patient Characteristics: Metastatic Hormonal, Postmenopausal, First Line, HER2 Negative/Unknown/Equivocal, Prior Adjuvant Endocrine Therapy Completed > 12 Months Ago Therapeutic Status: Distant Metastases BRCA Mutation Status: Did Not Order Test ER Status: Positive (+) HER2 Status: Negative (-) Would you be surprised if this patient died  in the next year<= I would be surprised if this patient died in the next year PR Status: Positive (+) Menopausal Status: Postmenopausal Line of therapy: First Line Previous Therapy: Prior Adjuvant Endocrine Therapy Completed > 12 Months Ago Intent of Therapy: Non-Curative / Palliative Intent, Discussed with Patient 

## 2017-09-23 NOTE — Patient Instructions (Signed)
Palbociclib capsules What is this medicine? PALBOCICLIB (pal boe SYE klib) is a medicine that targets proteins in cancer cells and stops the cancer cells from growing. It is used to treat breast cancer. This medicine may be used for other purposes; ask your health care provider or pharmacist if you have questions. COMMON BRAND NAME(S): Ibrance What should I tell my health care provider before I take this medicine? They need to know if you have any of these conditions: -infection (especially a virus infection such as chickenpox, cold sores, or herpes) -low blood counts, like low white cell, platelet, or red cell counts -an unusual or allergic reaction to palbociclib, other medicines, foods, dyes, or preservatives -pregnant or trying to get pregnant -breast-feeding How should I use this medicine? Take this medicine by mouth with a glass of water. Follow the directions on the prescription label. Take this medicine with food. Avoid grapefruit and grapefruit juice while you are taking this medicine. Swallow the capsule whole. Do not cut, crush or chew this medicine. Take your medicine at regular intervals. Do not take it more often than directed. Do not stop taking except on your doctor's advice. Talk to your pediatrician regarding the use of this medicine in children. Special care may be needed. Overdosage: If you think you have taken too much of this medicine contact a poison control center or emergency room at once. NOTE: This medicine is only for you. Do not share this medicine with others. What if I miss a dose? If you miss a dose or vomit after taking a dose, do not take another dose on that day. Take your next dose at your regular time. What may interact with this medicine? This medicine may interact with the following medications: -alfentanil -antiviral medicines for HIV or AIDS -boceprevir -bosentan -carbamazepine -certain medicines for fungal infections like ketoconazole, itraconazole,  posaconazole, voriconazole -clarithromycin -cyclosporine -ergot alkaloids like dihydroergotamine, ergotamine -everolimus -fentanyl -grapefruit juice -midazolam -modafinil -nafcillin -nefazodone -phenobarbital -phenytoin -pimozide -quinidine -rifampin -sirolimus -St. John's Wort -tacrolimus -telaprevir -telithromycin -verapamil This list may not describe all possible interactions. Give your health care provider a list of all the medicines, herbs, non-prescription drugs, or dietary supplements you use. Also tell them if you smoke, drink alcohol, or use illegal drugs. Some items may interact with your medicine. What should I watch for while using this medicine? Visit your doctor for regular check ups. Report any side effects. Continue your course of treatment unless your doctor tells you to stop. You will need blood work done while you are taking this medicine. Do not become pregnant while taking this medicine or for at least 3 weeks after stopping it. Women should inform their doctor if they wish to become pregnant or think they might be pregnant. Men should not father a child while taking this medicine and for 3 months after stopping it. There is a potential for serious side effects to an unborn child. Men should inform their doctors if they wish to father a child later. This medicine may lower sperm counts. Talk to your health care professional or pharmacist for more information. Do not breast-feed an infant while taking this medicine or for 3 weeks after the last dose. Avoid taking products that contain aspirin, acetaminophen, ibuprofen, naproxen, or ketoprofen unless instructed by your doctor. These medicines may hide a fever. Be careful brushing and flossing your teeth or using a toothpick because you may get an infection or bleed more easily. If you have any dental work done, tell your   dentist you are receiving this medicine. Call your doctor or health care professional for advice if  you get a fever, chills or sore throat, or other symptoms of a cold or flu. Do not treat yourself. This drug decreases your body's ability to fight infections. Try to avoid being around people who are sick. This medicine may increase your risk to bruise or bleed. Call your doctor or health care professional if you notice any unusual bleeding. This drug may make you feel generally unwell. This is not uncommon, as chemotherapy can affect healthy cells as well as cancer cells. Report any side effects. Continue your course of treatment even though you feel ill unless your doctor tells you to stop. What side effects may I notice from receiving this medicine? Side effects that you should report to your doctor or health care professional as soon as possible: -allergic reactions like skin rash, itching or hives, swelling of the face, lips, or tongue -dizziness -mouth sores -low blood counts - this medicine may decrease the number of white blood cells, red blood cells and platelets. You may be at increased risk for infections and bleeding. -pain, tingling, numbness in the hands or feet -severe or persistent diarrhea, nausea, vomiting -signs and symptoms of infection like fever or chills; cough; sore throat; pain or trouble passing urine -signs of decreased platelets or bleeding - nosebleed, bruising, pinpoint red spots on the skin, black, tarry stools, blood in the urine -signs of decreased red blood cells - unusually weak or tired, feeling faint or lightheaded, falls Side effects that usually do not require medical attention (report to your doctor or health care professional if they continue or are bothersome): -decreased appetite -hair thinning or hair loss -mild diarrhea -nausea -weak or tired This list may not describe all possible side effects. Call your doctor for medical advice about side effects. You may report side effects to FDA at 1-800-FDA-1088. Where should I keep my medicine? Keep out of  the reach of children. Store between 20 and 25 degrees C (68 and 77 degrees F). Throw away any unused medicine after the expiration date. NOTE: This sheet is a summary. It may not cover all possible information. If you have questions about this medicine, talk to your doctor, pharmacist, or health care provider.  2018 [employer] Standard (2016-03-14 16:30:07) Ribociclib tablets What is this medicine? RIBOCICLIB (rye boe SYE klib) is a medicine that targets proteins in cancer cells and stops the cancer cells from growing. It is used to treat breast cancer. This medicine may be used for other purposes; ask your health care provider or pharmacist if you have questions. COMMON BRAND NAME(S): KISQALI What should I tell my health care provider before I take this medicine? They need to know if you have any of these conditions: -heart disease -history of irregular heartbeat -infection (especially a virus infection such as chickenpox, cold sores, or herpes) -liver disease -low blood counts, like low white cell, platelet, or red cell counts -low levels of calcium, magnesium, potassium, or phosphorus in the blood -an unusual or allergic reaction to ribociclib, other medicines, foods, dyes, or preservatives -pregnant or trying to get pregnant -breast-feeding How should I use this medicine? Take this medicine by mouth with a glass of water. Follow the directions on the prescription label. Do not cut, crush or chew this medicine. Do not take with grapefruit juice, pomegranates, or pomegranate juice. You can take it with or without food. If it upsets your stomach, take it with food.  Take your medicine at regular intervals. Do not take it more often than directed. Do not stop taking except on your doctor's advice. Talk to your pediatrician regarding the use of this medicine in children. Special care may be needed. Overdosage: If you think you have taken too much of this medicine contact a poison control  center or emergency room at once. NOTE: This medicine is only for you. Do not share this medicine with others. What if I miss a dose? If you miss a dose or vomit after taking a dose, do not take another dose on that day. Take your next dose at your regular time. What may interact with this medicine? Do not take this medicine with any of the following medications: -cisapride -dofetilide -dronedarone -pimozide -thioridazine -ziprasidone This medicine may interact with the following medications: -alfentanil -antiviral medicines for HIV or AIDS -boceprevir -certain medicines for fungal infections like ketoconazole, itraconazole, posaconazole, and voriconazole -certain medicines for seizures like carbamazepine, phenobarbital, phenytoin -clarithromycin -conivaptan -cyclosporine -ergot alkaloids like dihydroergotamine, ergonovine, ergotamine, methylergonovine -everolimus -fentanyl -grapefruit juice -midazolam -nefazodone -other medicines that prolong the QT interval (cause an abnormal heart rhythm) -pomegranate juice -quinidine -rifampin -sirolimus -St. John's Wort -tacrolimus This list may not describe all possible interactions. Give your health care provider a list of all the medicines, herbs, non-prescription drugs, or dietary supplements you use. Also tell them if you smoke, drink alcohol, or use illegal drugs. Some items may interact with your medicine. What should I watch for while using this medicine? Tell your doctor or healthcare professional if your symptoms do not start to get better or if they get worse. Do not take this medicine close to bedtime. It may prevent you from sleeping. You may need blood work done while you are taking this medicine. Do not become pregnant while taking this medicine or for 3 weeks after the last dose. Women should inform their doctor if they wish to become pregnant or think they might be pregnant. There is a potential for serious side effects to  an unborn child. Men should inform their doctors if they wish to father a child. This medicine may lower sperm counts. Talk to your health care professional or pharmacist for more information. Do not breast-feed an infant while taking this medicine or for 3 weeks after the last dose. Avoid taking products that contain aspirin, acetaminophen, ibuprofen, naproxen, or ketoprofen unless instructed by your doctor. These medicines may hide a fever. Be careful brushing and flossing your teeth or using a toothpick because you may get an infection or bleed more easily. If you have any dental work done, tell your dentist you are receiving this medicine. Call your doctor or health care professional for advice if you get a fever, chills or sore throat, or other symptoms of a cold or flu. Do not treat yourself. This drug decreases your body's ability to fight infections. Try to avoid being around people who are sick. This medicine may increase your risk to bruise or bleed. Call your doctor or health care professional if you notice any unusual bleeding. What side effects may I notice from receiving this medicine? Side effects that you should report to your doctor or health care professional as soon as possible: -allergic reactions like skin rash, itching or hives, swelling of the face, lips, or tongue -signs of decreased platelets or bleeding - bruising, pinpoint red spots on the skin, black, tarry stools, blood in the urine -signs of decreased red blood cells - unusually weak or  tired, feeling faint or lightheaded, falls -signs of infection - fever or chills, cough, sore throat, pain or difficulty passing urine -signs and symptoms of a dangerous change in heartbeat or heart rhythm like chest pain; dizziness; fast or irregular heartbeat; palpitations; feeling faint or lightheaded, falls; breathing problems -signs and symptoms of liver injury like dark yellow or brown urine; general ill feeling or flu-like symptoms;  light-colored stools; loss of appetite; nausea; right upper belly pain; unusually weak or tired; yellowing of the eyes or skin -signs of low calcium like fast heartbeat, muscle cramps or muscle pain; pain, tingling, numbness in the hands or feet; seizures -signs and symptoms of low magnesium like muscle cramps, pain, or weakness; tremors; seizures; or fast, irregular heartbeat -signs and symptoms of low potassium like muscle cramps or muscle pain; chest pain; dizziness; feeling faint or lightheaded, falls; palpitations; breathing problems; or fast, irregular heartbeat Side effects that usually do not require medical attention (report these to your doctor or health care professional if they continue or are bothersome): -constipation -diarrhea -hair loss -headache -loss of appetite -mouth sores -nausea/vomiting -red spots on the skin -sore throat -stomach pain -swelling of the ankles, feet, hands -trouble sleeping This list may not describe all possible side effects. Call your doctor for medical advice about side effects. You may report side effects to FDA at 1-800-FDA-1088. Where should I keep my medicine? Keep out of the reach of children. Store between 20 and 25 degrees C (68 and 77 degrees F). Throw away any unused medicine after the expiration date. NOTE: This sheet is a summary. It may not cover all possible information. If you have questions about this medicine, talk to your doctor, pharmacist, or health care provider.  2018 [employer]/Gold Standard (2016-02-21 14:58:05) Fulvestrant injection What is this medicine? FULVESTRANT (ful VES trant) blocks the effects of estrogen. It is used to treat breast cancer. This medicine may be used for other purposes; ask your health care provider or pharmacist if you have questions. COMMON BRAND NAME(S): FASLODEX What should I tell my health care provider before I take this medicine? They need to know if you have any of these  conditions: -bleeding problems -liver disease -low levels of platelets in the blood -an unusual or allergic reaction to fulvestrant, other medicines, foods, dyes, or preservatives -pregnant or trying to get pregnant -breast-feeding How should I use this medicine? This medicine is for injection into a muscle. It is usually given by a health care professional in a hospital or clinic setting. Talk to your pediatrician regarding the use of this medicine in children. Special care may be needed. Overdosage: If you think you have taken too much of this medicine contact a poison control center or emergency room at once. NOTE: This medicine is only for you. Do not share this medicine with others. What if I miss a dose? It is important not to miss your dose. Call your doctor or health care professional if you are unable to keep an appointment. What may interact with this medicine? -medicines that treat or prevent blood clots like warfarin, enoxaparin, and dalteparin This list may not describe all possible interactions. Give your health care provider a list of all the medicines, herbs, non-prescription drugs, or dietary supplements you use. Also tell them if you smoke, drink alcohol, or use illegal drugs. Some items may interact with your medicine. What should I watch for while using this medicine? Your condition will be monitored carefully while you are receiving this medicine. You  will need important blood work done while you are taking this medicine. Do not become pregnant while taking this medicine or for at least 1 year after stopping it. Women of child-bearing potential will need to have a negative pregnancy test before starting this medicine. Women should inform their doctor if they wish to become pregnant or think they might be pregnant. There is a potential for serious side effects to an unborn child. Men should inform their doctors if they wish to father a child. This medicine may lower sperm  counts. Talk to your health care professional or pharmacist for more information. Do not breast-feed an infant while taking this medicine or for 1 year after the last dose. What side effects may I notice from receiving this medicine? Side effects that you should report to your doctor or health care professional as soon as possible: -allergic reactions like skin rash, itching or hives, swelling of the face, lips, or tongue -feeling faint or lightheaded, falls -pain, tingling, numbness, or weakness in the legs -signs and symptoms of infection like fever or chills; cough; flu-like symptoms; sore throat -vaginal bleeding Side effects that usually do not require medical attention (report to your doctor or health care professional if they continue or are bothersome): -aches, pains -constipation -diarrhea -headache -hot flashes -nausea, vomiting -pain at site where injected -stomach pain This list may not describe all possible side effects. Call your doctor for medical advice about side effects. You may report side effects to FDA at 1-800-FDA-1088. Where should I keep my medicine? This drug is given in a hospital or clinic and will not be stored at home. NOTE: This sheet is a summary. It may not cover all possible information. If you have questions about this medicine, talk to your doctor, pharmacist, or health care provider.  2018 [employer]/Gold Standard (2015-06-23 11:03:55) Denosumab injection What is this medicine? DENOSUMAB (den oh sue mab) slows bone breakdown. Prolia is used to treat osteoporosis in women after menopause and in men. Delton See is used to treat a high calcium level due to cancer and to prevent bone fractures and other bone problems caused by multiple myeloma or cancer bone metastases. Delton See is also used to treat giant cell tumor of the bone. This medicine may be used for other purposes; ask your health care provider or pharmacist if you have questions. COMMON BRAND NAME(S):  Prolia, XGEVA What should I tell my health care provider before I take this medicine? They need to know if you have any of these conditions: -dental disease -having surgery or tooth extraction -infection -kidney disease -low levels of calcium or Vitamin D in the blood -malnutrition -on hemodialysis -skin conditions or sensitivity -thyroid or parathyroid disease -an unusual reaction to denosumab, other medicines, foods, dyes, or preservatives -pregnant or trying to get pregnant -breast-feeding How should I use this medicine? This medicine is for injection under the skin. It is given by a health care professional in a hospital or clinic setting. If you are getting Prolia, a special MedGuide will be given to you by the pharmacist with each prescription and refill. Be sure to read this information carefully each time. For Prolia, talk to your pediatrician regarding the use of this medicine in children. Special care may be needed. For Delton See, talk to your pediatrician regarding the use of this medicine in children. While this drug may be prescribed for children as young as 13 years for selected conditions, precautions do apply. Overdosage: If you think you have taken too much  of this medicine contact a poison control center or emergency room at once. NOTE: This medicine is only for you. Do not share this medicine with others. What if I miss a dose? It is important not to miss your dose. Call your doctor or health care professional if you are unable to keep an appointment. What may interact with this medicine? Do not take this medicine with any of the following medications: -other medicines containing denosumab This medicine may also interact with the following medications: -medicines that lower your chance of fighting infection -steroid medicines like prednisone or cortisone This list may not describe all possible interactions. Give your health care provider a list of all the medicines, herbs,  non-prescription drugs, or dietary supplements you use. Also tell them if you smoke, drink alcohol, or use illegal drugs. Some items may interact with your medicine. What should I watch for while using this medicine? Visit your doctor or health care professional for regular checks on your progress. Your doctor or health care professional may order blood tests and other tests to see how you are doing. Call your doctor or health care professional for advice if you get a fever, chills or sore throat, or other symptoms of a cold or flu. Do not treat yourself. This drug may decrease your body's ability to fight infection. Try to avoid being around people who are sick. You should make sure you get enough calcium and vitamin D while you are taking this medicine, unless your doctor tells you not to. Discuss the foods you eat and the vitamins you take with your health care professional. See your dentist regularly. Brush and floss your teeth as directed. Before you have any dental work done, tell your dentist you are receiving this medicine. Do not become pregnant while taking this medicine or for 5 months after stopping it. Talk with your doctor or health care professional about your birth control options while taking this medicine. Women should inform their doctor if they wish to become pregnant or think they might be pregnant. There is a potential for serious side effects to an unborn child. Talk to your health care professional or pharmacist for more information. What side effects may I notice from receiving this medicine? Side effects that you should report to your doctor or health care professional as soon as possible: -allergic reactions like skin rash, itching or hives, swelling of the face, lips, or tongue -bone pain -breathing problems -dizziness -jaw pain, especially after dental work -redness, blistering, peeling of the skin -signs and symptoms of infection like fever or chills; cough; sore throat;  pain or trouble passing urine -signs of low calcium like fast heartbeat, muscle cramps or muscle pain; pain, tingling, numbness in the hands or feet; seizures -unusual bleeding or bruising -unusually weak or tired Side effects that usually do not require medical attention (report to your doctor or health care professional if they continue or are bothersome): -constipation -diarrhea -headache -joint pain -loss of appetite -muscle pain -runny nose -tiredness -upset stomach This list may not describe all possible side effects. Call your doctor for medical advice about side effects. You may report side effects to FDA at 1-800-FDA-1088. Where should I keep my medicine? This medicine is only given in a clinic, doctor's office, or other health care setting and will not be stored at home. NOTE: This sheet is a summary. It may not cover all possible information. If you have questions about this medicine, talk to your doctor, pharmacist, or health  care provider.  2018 [employer] Standard (2016-12-17 19:17:21)

## 2017-09-26 ENCOUNTER — Inpatient Hospital Stay: Payer: Medicare Other

## 2017-09-26 DIAGNOSIS — Z79899 Other long term (current) drug therapy: Secondary | ICD-10-CM | POA: Diagnosis not present

## 2017-09-26 DIAGNOSIS — C7951 Secondary malignant neoplasm of bone: Secondary | ICD-10-CM | POA: Diagnosis not present

## 2017-09-26 DIAGNOSIS — Z9223 Personal history of estrogen therapy: Secondary | ICD-10-CM | POA: Diagnosis not present

## 2017-09-26 DIAGNOSIS — Z85828 Personal history of other malignant neoplasm of skin: Secondary | ICD-10-CM | POA: Diagnosis not present

## 2017-09-26 DIAGNOSIS — Z7982 Long term (current) use of aspirin: Secondary | ICD-10-CM | POA: Diagnosis not present

## 2017-09-26 DIAGNOSIS — Z170 Estrogen receptor positive status [ER+]: Principal | ICD-10-CM

## 2017-09-26 DIAGNOSIS — C50412 Malignant neoplasm of upper-outer quadrant of left female breast: Secondary | ICD-10-CM

## 2017-09-26 DIAGNOSIS — K449 Diaphragmatic hernia without obstruction or gangrene: Secondary | ICD-10-CM | POA: Diagnosis not present

## 2017-09-26 DIAGNOSIS — Z9842 Cataract extraction status, left eye: Secondary | ICD-10-CM | POA: Diagnosis not present

## 2017-09-26 DIAGNOSIS — Z853 Personal history of malignant neoplasm of breast: Secondary | ICD-10-CM | POA: Diagnosis not present

## 2017-09-26 DIAGNOSIS — Z9221 Personal history of antineoplastic chemotherapy: Secondary | ICD-10-CM | POA: Diagnosis not present

## 2017-09-26 DIAGNOSIS — K219 Gastro-esophageal reflux disease without esophagitis: Secondary | ICD-10-CM | POA: Diagnosis not present

## 2017-09-26 DIAGNOSIS — Z923 Personal history of irradiation: Secondary | ICD-10-CM | POA: Diagnosis not present

## 2017-09-26 DIAGNOSIS — M85851 Other specified disorders of bone density and structure, right thigh: Secondary | ICD-10-CM

## 2017-09-26 DIAGNOSIS — I700 Atherosclerosis of aorta: Secondary | ICD-10-CM | POA: Diagnosis not present

## 2017-09-26 DIAGNOSIS — I10 Essential (primary) hypertension: Secondary | ICD-10-CM | POA: Diagnosis not present

## 2017-09-26 DIAGNOSIS — I2510 Atherosclerotic heart disease of native coronary artery without angina pectoris: Secondary | ICD-10-CM | POA: Diagnosis not present

## 2017-09-26 LAB — COMPREHENSIVE METABOLIC PANEL
ALT: 13 U/L — ABNORMAL LOW (ref 14–54)
AST: 22 U/L (ref 15–41)
Albumin: 3.9 g/dL (ref 3.5–5.0)
Alkaline Phosphatase: 89 U/L (ref 38–126)
Anion gap: 7 (ref 5–15)
BUN: 21 mg/dL — ABNORMAL HIGH (ref 6–20)
CO2: 29 mmol/L (ref 22–32)
Calcium: 9.1 mg/dL (ref 8.9–10.3)
Chloride: 102 mmol/L (ref 101–111)
Creatinine, Ser: 0.83 mg/dL (ref 0.44–1.00)
GFR calc Af Amer: >60 mL/min (ref >60)
GFR calc non Af Amer: >60 mL/min (ref >60)
Glucose, Bld: 113 mg/dL — ABNORMAL HIGH (ref 65–99)
Potassium: 3.9 mmol/L (ref 3.5–5.1)
Sodium: 138 mmol/L (ref 135–145)
Total Bilirubin: 0.5 mg/dL (ref 0.3–1.2)
Total Protein: 7.5 g/dL (ref 6.5–8.1)

## 2017-09-26 LAB — CBC WITH DIFFERENTIAL/PLATELET
Basophils Absolute: 0.1 10*3/uL (ref 0–0.1)
Basophils Relative: 1 %
Eosinophils Absolute: 0.3 10*3/uL (ref 0–0.7)
Eosinophils Relative: 5 %
HCT: 39.5 % (ref 35.0–47.0)
Hemoglobin: 13 g/dL (ref 12.0–16.0)
Lymphocytes Relative: 17 %
Lymphs Abs: 1.1 10*3/uL (ref 1.0–3.6)
MCH: 29.1 pg (ref 26.0–34.0)
MCHC: 33 g/dL (ref 32.0–36.0)
MCV: 88.2 fL (ref 80.0–100.0)
Monocytes Absolute: 0.5 10*3/uL (ref 0.2–0.9)
Monocytes Relative: 9 %
Neutro Abs: 4.4 10*3/uL (ref 1.4–6.5)
Neutrophils Relative %: 68 %
Platelets: 227 10*3/uL (ref 150–440)
RBC: 4.48 MIL/uL (ref 3.80–5.20)
RDW: 14.5 % (ref 11.5–14.5)
WBC: 6.4 10*3/uL (ref 3.6–11.0)

## 2017-09-26 MED ORDER — DENOSUMAB 120 MG/1.7ML ~~LOC~~ SOLN
120.0000 mg | Freq: Once | SUBCUTANEOUS | Status: AC
  Administered 2017-09-26: 120 mg via SUBCUTANEOUS

## 2017-09-26 MED ORDER — FULVESTRANT 250 MG/5ML IM SOLN
500.0000 mg | Freq: Once | INTRAMUSCULAR | Status: AC
  Administered 2017-09-26: 500 mg via INTRAMUSCULAR

## 2017-09-29 LAB — KAPPA/LAMBDA LIGHT CHAINS
Kappa free light chain: 30.5 mg/L — ABNORMAL HIGH (ref 3.3–19.4)
Kappa, lambda light chain ratio: 1.23 (ref 0.26–1.65)
Lambda free light chains: 24.7 mg/L (ref 5.7–26.3)

## 2017-09-29 LAB — PROTEIN ELECTROPHORESIS, SERUM
A/G Ratio: 1 (ref 0.7–1.7)
Albumin ELP: 3.4 g/dL (ref 2.9–4.4)
Alpha-1-Globulin: 0.2 g/dL (ref 0.0–0.4)
Alpha-2-Globulin: 0.9 g/dL (ref 0.4–1.0)
Beta Globulin: 1 g/dL (ref 0.7–1.3)
Gamma Globulin: 1.3 g/dL (ref 0.4–1.8)
Globulin, Total: 3.4 g/dL (ref 2.2–3.9)
Total Protein ELP: 6.8 g/dL (ref 6.0–8.5)

## 2017-10-01 ENCOUNTER — Ambulatory Visit
Admission: RE | Admit: 2017-10-01 | Discharge: 2017-10-01 | Disposition: A | Discharge status: Home / Self Care | Payer: Medicare Other | Source: Ambulatory Visit | Attending: Radiation Oncology | Admitting: Radiation Oncology

## 2017-10-01 ENCOUNTER — Encounter: Payer: Self-pay | Admitting: Radiation Oncology

## 2017-10-01 VITALS — BP 145/68 | HR 58 | Temp 97.5°F | Resp 20 | Wt 198.5 lb

## 2017-10-01 DIAGNOSIS — K219 Gastro-esophageal reflux disease without esophagitis: Secondary | ICD-10-CM | POA: Insufficient documentation

## 2017-10-01 DIAGNOSIS — Z510 Encounter for antineoplastic radiation therapy: Secondary | ICD-10-CM | POA: Insufficient documentation

## 2017-10-01 DIAGNOSIS — Z79899 Other long term (current) drug therapy: Secondary | ICD-10-CM | POA: Insufficient documentation

## 2017-10-01 DIAGNOSIS — Z923 Personal history of irradiation: Secondary | ICD-10-CM | POA: Diagnosis not present

## 2017-10-01 DIAGNOSIS — Z803 Family history of malignant neoplasm of breast: Secondary | ICD-10-CM | POA: Diagnosis not present

## 2017-10-01 DIAGNOSIS — F419 Anxiety disorder, unspecified: Secondary | ICD-10-CM | POA: Diagnosis not present

## 2017-10-01 DIAGNOSIS — M129 Arthropathy, unspecified: Secondary | ICD-10-CM | POA: Insufficient documentation

## 2017-10-01 DIAGNOSIS — I10 Essential (primary) hypertension: Secondary | ICD-10-CM | POA: Diagnosis not present

## 2017-10-01 DIAGNOSIS — Z809 Family history of malignant neoplasm, unspecified: Secondary | ICD-10-CM | POA: Insufficient documentation

## 2017-10-01 DIAGNOSIS — Z8701 Personal history of pneumonia (recurrent): Secondary | ICD-10-CM | POA: Diagnosis not present

## 2017-10-01 DIAGNOSIS — C7951 Secondary malignant neoplasm of bone: Secondary | ICD-10-CM | POA: Diagnosis not present

## 2017-10-01 DIAGNOSIS — Z85828 Personal history of other malignant neoplasm of skin: Secondary | ICD-10-CM | POA: Insufficient documentation

## 2017-10-01 DIAGNOSIS — Z170 Estrogen receptor positive status [ER+]: Secondary | ICD-10-CM | POA: Diagnosis not present

## 2017-10-01 DIAGNOSIS — C50912 Malignant neoplasm of unspecified site of left female breast: Secondary | ICD-10-CM | POA: Diagnosis not present

## 2017-10-01 DIAGNOSIS — G629 Polyneuropathy, unspecified: Secondary | ICD-10-CM | POA: Insufficient documentation

## 2017-10-01 DIAGNOSIS — C50911 Malignant neoplasm of unspecified site of right female breast: Secondary | ICD-10-CM | POA: Diagnosis not present

## 2017-10-01 DIAGNOSIS — I499 Cardiac arrhythmia, unspecified: Secondary | ICD-10-CM | POA: Insufficient documentation

## 2017-10-01 DIAGNOSIS — Z7982 Long term (current) use of aspirin: Secondary | ICD-10-CM | POA: Insufficient documentation

## 2017-10-01 NOTE — Consult Note (Signed)
NEW PATIENT EVALUATION  Name: Kelli Williams  MRN: [id number]  Date:   10/01/2017     DOB: 31-Mar-1926   This 83 y.o. female patient presents to the clinic for initial evaluation of widespread metastatic disease from presumed breast cancer diagnosed in 2000.  REFERRING PHYSICIAN: Glean Hess, MD  CHIEF COMPLAINT:  Chief Complaint  Patient presents with  . Breast Cancer    Pt is here for initial consultation of metastatic breast cancer.      DIAGNOSIS: The encounter diagnosis was Bone metastases ([place]).   PREVIOUS INVESTIGATIONS:  PET/CT plain films and CT scans reviewed Clinical notes reviewed  HPI: Patient is an 83 year old female initially treated with lumpectomy and postoperative radiation therapy back in 2000 for what is presumed a stage I invasive mammary carcinoma. She has been dealing with chronic actinic lower back and left hip pain. Recently PET CT scan demonstrated widespread metastatic disease throughout her axial skeleton. There is significant disease in the left hip with plain films showing lip sub-trochanteric metastatic disease. She had biopsy of a left axillary mass seen on PET/CT scan which was positive for invasive mammary carcinoma with focal lobular features. Tumor was ER/PR positive strongly an equivocal HER-2/neu 2+. Back in 2000 her tumor was a stage I lesion receiving adjuvant radiation therapy as well as 5 years of tamoxifen. She has been started on X [place]. She is referred today for radiation oncology for consideration of palliative treatment. She states her back is somewhat sore although not significant pain. Major pain is in the left hip. She is emulating with the assistance of a walker.  PLANNED TREATMENT REGIMEN: Palliative ration therapy to left hip  PAST MEDICAL HISTORY:  has a past medical history of Anxiety; Arthritis; Breast cancer ([place]) (2000); Complication of anesthesia; Dysrhythmia; GERD (gastroesophageal reflux disease); Hypertension;  Neuropathic pain of both legs; Neuropathy; Pneumonia (2016); Skin cancer of forehead; and Skin cancer of nose.    PAST SURGICAL HISTORY:  Past Surgical History:  Procedure Laterality Date  . ABDOMINAL HYSTERECTOMY    . AXILLARY LYMPH NODE BIOPSY Left 07/10/2017   INVASIVE MAMMARY CARCINOMA WITH FOCAL LOBULAR FEATURES.   Marland Kitchen BREAST BIOPSY Bilateral 1960   benign  . BREAST LUMPECTOMY Left 2000   breast ca with rad tx  . BREAST LUMPECTOMY Left 08/20/2017  . BREAST LUMPECTOMY Left 08/20/2017   Procedure: left breast wide excision;  Surgeon: Robert Bellow, MD;  Location: [facility] ORS;  Service: General;  Laterality: Left;  . CATARACT EXTRACTION     left eye  . CHOLECYSTECTOMY    . JOINT REPLACEMENT    . NASAL SEPTUM SURGERY    . PARTIAL KNEE ARTHROPLASTY     right  . TOTAL VAGINAL HYSTERECTOMY      FAMILY HISTORY: family history includes Breast cancer (age of onset: 60) in her daughter; Cancer in her paternal grandmother; Heart attack in her paternal uncle; Heart disease in her mother.  SOCIAL HISTORY:  reports that she has never smoked. She has never used smokeless tobacco. She reports that she does not drink alcohol or use drugs.  ALLERGIES: Amitriptyline; Amoxicillin; Ivp dye [iodinated diagnostic agents]; Sulfa antibiotics; and Sulfa antibiotics  MEDICATIONS:  Current Outpatient Prescriptions  Medication Sig Dispense Refill  . acetaminophen (TYLENOL) 325 MG tablet Take 650 mg by mouth every 6 (six) hours as needed.    Marland Kitchen aspirin 81 MG tablet Take 1 tablet by mouth daily.    . Calcium-Magnesium 500-250 MG TABS Take 1 tablet by  mouth daily.     . Coenzyme Q10 (CO Q-10) 100 MG CAPS Take by mouth daily.    Marland Kitchen gabapentin (NEURONTIN) 300 MG capsule Take 3 capsules (900 mg total) by mouth 2 (two) times daily. 540 capsule 3  . lisinopril-hydrochlorothiazide (PRINZIDE,ZESTORETIC) 20-25 MG tablet TAKE 1 AND 1/2 TABLETS BY  MOUTH DAILY 135 tablet 3  . meloxicam (MOBIC) 15 MG tablet Take 1  tablet (15 mg total) by mouth daily. 90 tablet 1  . Multiple Vitamins-Minerals (MULTIVITAMIN WITH MINERALS) tablet Take 1 tablet by mouth daily.    Marland Kitchen omeprazole (PRILOSEC) 20 MG capsule Take 1 capsule (20 mg total) by mouth daily. 90 capsule 1  . propranolol (INDERAL) 10 MG tablet TAKE 1 TABLET BY MOUTH UP  TO 3 TIMES DAILY AS NEEDED (Patient not taking: Reported on 09/23/2017) 270 tablet 1   No current facility-administered medications for this encounter.     ECOG PERFORMANCE STATUS:  1 - Symptomatic but completely ambulatory  REVIEW OF SYSTEMS:  Patient denies any weight loss, fatigue, weakness, fever, chills or night sweats. Patient denies any loss of vision, blurred vision. Patient denies any ringing  of the ears or hearing loss. No irregular heartbeat. Patient denies heart murmur or history of fainting. Patient denies any chest pain or pain radiating to her upper extremities. Patient denies any shortness of breath, difficulty breathing at night, cough or hemoptysis. Patient denies any swelling in the lower legs. Patient denies any nausea vomiting, vomiting of blood, or coffee ground material in the vomitus. Patient denies any stomach pain. Patient states has had normal bowel movements no significant constipation or diarrhea. Patient denies any dysuria, hematuria or significant nocturia. Patient denies any problems walking, swelling in the joints or loss of balance. Patient denies any skin changes, loss of hair or loss of weight. Patient denies any excessive worrying or anxiety or significant depression. Patient denies any problems with insomnia. Patient denies excessive thirst, polyuria, polydipsia. Patient denies any swollen glands, patient denies easy bruising or easy bleeding. Patient denies any recent infections, allergies or URI. Patient "s visual fields have not changed significantly in recent time.    PHYSICAL EXAM: BP (!) 145/68 Comment: manual recheck  Pulse (!) 58   Temp (!) 97.5 F  (36.4 C)   Resp 20   Wt 198 lb 8.4 oz (90.1 kg)   BMI 35.17 kg/m  Elderly female in NAD. Deep palpation of her spine does not elicit pain she has some decreased strength in her left lower extremity most likely secondary to pain. Range of motion of her left lower from he does elicit some pain sensory levels are equal and symmetric in the lower extremities bilaterally. Well-developed well-nourished patient in NAD. HEENT reveals PERLA, EOMI, discs not visualized.  Oral cavity is clear. No oral mucosal lesions are identified. Neck is clear without evidence of cervical or supraclavicular adenopathy. Lungs are clear to A&P. Cardiac examination is essentially unremarkable with regular rate and rhythm without murmur rub or thrill. Abdomen is benign with no organomegaly or masses noted. Motor sensory and DTR levels are equal and symmetric in the upper and lower extremities. Cranial nerves II through XII are grossly intact. Proprioception is intact. No peripheral adenopathy or edema is identified. No motor or sensory levels are noted. Crude visual fields are within normal range.  LABORATORY DATA: Previous pathology reports are reviewed    RADIOLOGY RESULTS: Plain film CT scans and PET/CT scans reviewed   IMPRESSION: Widespread metastatic breast cancer in 83 year old  female with significant pain and left hip  PLAN: At this time I to go ahead with palliative course of radiation therapy to her left hip. Would plan on delivering 3000 cGy in 10 fractions. I have personally set up and ordered CT simulation. Risks and benefits of treatment including skin reaction fatigue alteration of blood counts all were described in detail. Other areas am concerned about his her thoracic lumbar spine and pelvis although I will hold off any palliative treatments to those areas until she possibly develops significant pain. Patient is aware along with her daughter of my recommendations.  I would like to take this opportunity to  thank you for allowing me to participate in the care of your patient.Armstead Peaks., MD

## 2017-10-02 ENCOUNTER — Telehealth: Payer: Self-pay | Admitting: Urgent Care

## 2017-10-02 ENCOUNTER — Other Ambulatory Visit: Payer: Self-pay | Admitting: Urgent Care

## 2017-10-02 MED ORDER — ONDANSETRON 4 MG PO TBDP: 4.0000 mg | ORAL_TABLET | Freq: Three times a day (TID) | ORAL | 0 refills | Status: DC | PRN

## 2017-10-02 NOTE — Telephone Encounter (Signed)
Call returned to patient to discuss concerns related to injections (Faslodex and Xgeva) that she received on Friday. Patient reports that she "felt bad" over the weekend. She notes that she was "sick on her stomach"and had some diarrhea. Symptoms have since resolved. Patient reports that she feels "pretty good" for the most part, however she does note that she is a "little wiped out". Discussed side effect profile associated with both Faslodex and Xgeva. Reviewed plan of care for continued treatment with these medications, as well as her plan for radiation therapy. Patient requesting something to have on hand for nausea. We will send a prescription for Ondansetron 4mg  ODT tablets to [facility] per patient request.   Patient also requesting a referral for home health services. She notes that she has had this in the past or assistance after surgery. Patient notes that she needs some additional assistance with her ADLs. We will send her [facility] health referral [facility] today.   Patient very appreciative of return call from this NP. We discussed her need to increase her  fluid intake. Additionally, she was encouraged to use Loperamide as needed for diarrhea. I encouraged her to return a call tomorrow to the clinic if she is still feeling weak, or she has any additional concerns. I provided a direct number and my name as a contact person. Dr. Mike Gip updated and plan of care as it stands at this time.

## 2017-10-03 ENCOUNTER — Telehealth: Payer: Self-pay | Admitting: *Deleted

## 2017-10-03 NOTE — Telephone Encounter (Signed)
Kelli Williams informed of this and was grateful for the response

## 2017-10-03 NOTE — Telephone Encounter (Signed)
Patient is having hair loss and irritated IBS symptoms so she is already talking about stopping treatment. Asking for a home health referral for nursing and medicine box check, she has weakness and lives alone in the [place] at [place]. Please send orders if in agreement

## 2017-10-03 NOTE — Telephone Encounter (Signed)
[facility] confirms receipt of referral and will be reaching out to patient soon to open her to services

## 2017-10-03 NOTE — Telephone Encounter (Signed)
I spoke with her yesterday. I sent in nausea medication and advised her to take Loperamide. I also advised her that I would be sending over home health referral. Orders were sent to [facility]. Can we follow up with them?

## 2017-10-07 ENCOUNTER — Ambulatory Visit
Admission: RE | Admit: 2017-10-07 | Discharge: 2017-10-07 | Disposition: A | Discharge status: Home / Self Care | Payer: Medicare Other | Source: Ambulatory Visit | Attending: Radiation Oncology | Admitting: Radiation Oncology

## 2017-10-07 ENCOUNTER — Inpatient Hospital Stay: Payer: Medicare Other

## 2017-10-07 ENCOUNTER — Ambulatory Visit: Payer: Medicare Other

## 2017-10-07 ENCOUNTER — Other Ambulatory Visit: Payer: Medicare Other

## 2017-10-07 ENCOUNTER — Other Ambulatory Visit: Payer: Self-pay

## 2017-10-07 DIAGNOSIS — G629 Polyneuropathy, unspecified: Secondary | ICD-10-CM | POA: Diagnosis not present

## 2017-10-07 DIAGNOSIS — Z79899 Other long term (current) drug therapy: Secondary | ICD-10-CM | POA: Diagnosis not present

## 2017-10-07 DIAGNOSIS — Z803 Family history of malignant neoplasm of breast: Secondary | ICD-10-CM | POA: Diagnosis not present

## 2017-10-07 DIAGNOSIS — C50412 Malignant neoplasm of upper-outer quadrant of left female breast: Secondary | ICD-10-CM

## 2017-10-07 DIAGNOSIS — Z809 Family history of malignant neoplasm, unspecified: Secondary | ICD-10-CM | POA: Diagnosis not present

## 2017-10-07 DIAGNOSIS — M129 Arthropathy, unspecified: Secondary | ICD-10-CM | POA: Diagnosis not present

## 2017-10-07 DIAGNOSIS — Z7982 Long term (current) use of aspirin: Secondary | ICD-10-CM | POA: Diagnosis not present

## 2017-10-07 DIAGNOSIS — Z9842 Cataract extraction status, left eye: Secondary | ICD-10-CM | POA: Diagnosis not present

## 2017-10-07 DIAGNOSIS — K449 Diaphragmatic hernia without obstruction or gangrene: Secondary | ICD-10-CM | POA: Diagnosis not present

## 2017-10-07 DIAGNOSIS — Z9221 Personal history of antineoplastic chemotherapy: Secondary | ICD-10-CM | POA: Diagnosis not present

## 2017-10-07 DIAGNOSIS — Z170 Estrogen receptor positive status [ER+]: Secondary | ICD-10-CM | POA: Diagnosis not present

## 2017-10-07 DIAGNOSIS — C7951 Secondary malignant neoplasm of bone: Secondary | ICD-10-CM | POA: Diagnosis not present

## 2017-10-07 DIAGNOSIS — I2510 Atherosclerotic heart disease of native coronary artery without angina pectoris: Secondary | ICD-10-CM | POA: Diagnosis not present

## 2017-10-07 DIAGNOSIS — C50912 Malignant neoplasm of unspecified site of left female breast: Secondary | ICD-10-CM | POA: Diagnosis not present

## 2017-10-07 DIAGNOSIS — Z853 Personal history of malignant neoplasm of breast: Secondary | ICD-10-CM | POA: Diagnosis not present

## 2017-10-07 DIAGNOSIS — C50911 Malignant neoplasm of unspecified site of right female breast: Secondary | ICD-10-CM | POA: Diagnosis not present

## 2017-10-07 DIAGNOSIS — Z85828 Personal history of other malignant neoplasm of skin: Secondary | ICD-10-CM | POA: Diagnosis not present

## 2017-10-07 DIAGNOSIS — I499 Cardiac arrhythmia, unspecified: Secondary | ICD-10-CM | POA: Diagnosis not present

## 2017-10-07 DIAGNOSIS — K219 Gastro-esophageal reflux disease without esophagitis: Secondary | ICD-10-CM | POA: Diagnosis not present

## 2017-10-07 DIAGNOSIS — Z9223 Personal history of estrogen therapy: Secondary | ICD-10-CM | POA: Diagnosis not present

## 2017-10-07 DIAGNOSIS — Z510 Encounter for antineoplastic radiation therapy: Secondary | ICD-10-CM | POA: Diagnosis not present

## 2017-10-07 DIAGNOSIS — I10 Essential (primary) hypertension: Secondary | ICD-10-CM | POA: Diagnosis not present

## 2017-10-07 DIAGNOSIS — Z923 Personal history of irradiation: Secondary | ICD-10-CM | POA: Diagnosis not present

## 2017-10-07 DIAGNOSIS — I700 Atherosclerosis of aorta: Secondary | ICD-10-CM | POA: Diagnosis not present

## 2017-10-07 LAB — BASIC METABOLIC PANEL
Anion gap: 8 (ref 5–15)
BUN: 27 mg/dL — ABNORMAL HIGH (ref 6–20)
CO2: 28 mmol/L (ref 22–32)
Calcium: 8.9 mg/dL (ref 8.9–10.3)
Chloride: 100 mmol/L — ABNORMAL LOW (ref 101–111)
Creatinine, Ser: 0.83 mg/dL (ref 0.44–1.00)
GFR calc Af Amer: >60 mL/min (ref >60)
GFR calc non Af Amer: >60 mL/min (ref >60)
Glucose, Bld: 99 mg/dL (ref 65–99)
Potassium: 4.2 mmol/L (ref 3.5–5.1)
Sodium: 136 mmol/L (ref 135–145)

## 2017-10-09 DIAGNOSIS — K219 Gastro-esophageal reflux disease without esophagitis: Secondary | ICD-10-CM | POA: Diagnosis not present

## 2017-10-09 DIAGNOSIS — C50412 Malignant neoplasm of upper-outer quadrant of left female breast: Secondary | ICD-10-CM | POA: Diagnosis not present

## 2017-10-09 DIAGNOSIS — Z96651 Presence of right artificial knee joint: Secondary | ICD-10-CM | POA: Diagnosis not present

## 2017-10-09 DIAGNOSIS — Z170 Estrogen receptor positive status [ER+]: Secondary | ICD-10-CM | POA: Diagnosis not present

## 2017-10-09 DIAGNOSIS — I10 Essential (primary) hypertension: Secondary | ICD-10-CM | POA: Diagnosis not present

## 2017-10-09 DIAGNOSIS — C7951 Secondary malignant neoplasm of bone: Secondary | ICD-10-CM | POA: Diagnosis not present

## 2017-10-10 ENCOUNTER — Other Ambulatory Visit: Payer: Self-pay | Admitting: *Deleted

## 2017-10-10 DIAGNOSIS — C7951 Secondary malignant neoplasm of bone: Secondary | ICD-10-CM

## 2017-10-13 ENCOUNTER — Inpatient Hospital Stay: Payer: Medicare Other

## 2017-10-13 ENCOUNTER — Inpatient Hospital Stay: Payer: Medicare Other | Attending: Hematology and Oncology

## 2017-10-13 ENCOUNTER — Ambulatory Visit
Admission: RE | Admit: 2017-10-13 | Discharge: 2017-10-13 | Disposition: A | Discharge status: Home / Self Care | Payer: Medicare Other | Source: Ambulatory Visit | Attending: Radiation Oncology | Admitting: Radiation Oncology

## 2017-10-13 DIAGNOSIS — B372 Candidiasis of skin and nail: Secondary | ICD-10-CM | POA: Diagnosis not present

## 2017-10-13 DIAGNOSIS — N6489 Other specified disorders of breast: Secondary | ICD-10-CM | POA: Insufficient documentation

## 2017-10-13 DIAGNOSIS — Z803 Family history of malignant neoplasm of breast: Secondary | ICD-10-CM | POA: Diagnosis not present

## 2017-10-13 DIAGNOSIS — Z809 Family history of malignant neoplasm, unspecified: Secondary | ICD-10-CM | POA: Diagnosis not present

## 2017-10-13 DIAGNOSIS — Z880 Allergy status to penicillin: Secondary | ICD-10-CM | POA: Insufficient documentation

## 2017-10-13 DIAGNOSIS — I10 Essential (primary) hypertension: Secondary | ICD-10-CM | POA: Diagnosis not present

## 2017-10-13 DIAGNOSIS — Z79818 Long term (current) use of other agents affecting estrogen receptors and estrogen levels: Secondary | ICD-10-CM | POA: Diagnosis not present

## 2017-10-13 DIAGNOSIS — Z170 Estrogen receptor positive status [ER+]: Secondary | ICD-10-CM | POA: Insufficient documentation

## 2017-10-13 DIAGNOSIS — K219 Gastro-esophageal reflux disease without esophagitis: Secondary | ICD-10-CM | POA: Insufficient documentation

## 2017-10-13 DIAGNOSIS — C7951 Secondary malignant neoplasm of bone: Secondary | ICD-10-CM | POA: Insufficient documentation

## 2017-10-13 DIAGNOSIS — I499 Cardiac arrhythmia, unspecified: Secondary | ICD-10-CM | POA: Diagnosis not present

## 2017-10-13 DIAGNOSIS — Z923 Personal history of irradiation: Secondary | ICD-10-CM | POA: Insufficient documentation

## 2017-10-13 DIAGNOSIS — C773 Secondary and unspecified malignant neoplasm of axilla and upper limb lymph nodes: Secondary | ICD-10-CM | POA: Insufficient documentation

## 2017-10-13 DIAGNOSIS — Z85828 Personal history of other malignant neoplasm of skin: Secondary | ICD-10-CM | POA: Insufficient documentation

## 2017-10-13 DIAGNOSIS — M85851 Other specified disorders of bone density and structure, right thigh: Secondary | ICD-10-CM

## 2017-10-13 DIAGNOSIS — Z510 Encounter for antineoplastic radiation therapy: Secondary | ICD-10-CM | POA: Diagnosis not present

## 2017-10-13 DIAGNOSIS — C50412 Malignant neoplasm of upper-outer quadrant of left female breast: Secondary | ICD-10-CM | POA: Insufficient documentation

## 2017-10-13 DIAGNOSIS — K580 Irritable bowel syndrome with diarrhea: Secondary | ICD-10-CM | POA: Insufficient documentation

## 2017-10-13 DIAGNOSIS — Z853 Personal history of malignant neoplasm of breast: Secondary | ICD-10-CM | POA: Diagnosis not present

## 2017-10-13 DIAGNOSIS — Z7982 Long term (current) use of aspirin: Secondary | ICD-10-CM | POA: Diagnosis not present

## 2017-10-13 DIAGNOSIS — C50912 Malignant neoplasm of unspecified site of left female breast: Secondary | ICD-10-CM | POA: Diagnosis not present

## 2017-10-13 DIAGNOSIS — G629 Polyneuropathy, unspecified: Secondary | ICD-10-CM | POA: Diagnosis not present

## 2017-10-13 DIAGNOSIS — C50911 Malignant neoplasm of unspecified site of right female breast: Secondary | ICD-10-CM | POA: Diagnosis not present

## 2017-10-13 DIAGNOSIS — Z9223 Personal history of estrogen therapy: Secondary | ICD-10-CM | POA: Insufficient documentation

## 2017-10-13 DIAGNOSIS — M129 Arthropathy, unspecified: Secondary | ICD-10-CM | POA: Diagnosis not present

## 2017-10-13 DIAGNOSIS — Z79899 Other long term (current) drug therapy: Secondary | ICD-10-CM | POA: Insufficient documentation

## 2017-10-13 MED ORDER — FULVESTRANT 250 MG/5ML IM SOLN
500.0000 mg | Freq: Once | INTRAMUSCULAR | Status: AC
  Administered 2017-10-13: 500 mg via INTRAMUSCULAR
  Filled 2017-10-13: qty 10

## 2017-10-14 ENCOUNTER — Inpatient Hospital Stay: Payer: Medicare Other

## 2017-10-14 ENCOUNTER — Ambulatory Visit
Admission: RE | Admit: 2017-10-14 | Discharge: 2017-10-14 | Disposition: A | Discharge status: Home / Self Care | Payer: Medicare Other | Source: Ambulatory Visit | Attending: Radiation Oncology | Admitting: Radiation Oncology

## 2017-10-14 DIAGNOSIS — Z923 Personal history of irradiation: Secondary | ICD-10-CM | POA: Diagnosis not present

## 2017-10-14 DIAGNOSIS — K219 Gastro-esophageal reflux disease without esophagitis: Secondary | ICD-10-CM | POA: Diagnosis not present

## 2017-10-14 DIAGNOSIS — I499 Cardiac arrhythmia, unspecified: Secondary | ICD-10-CM | POA: Diagnosis not present

## 2017-10-14 DIAGNOSIS — C7951 Secondary malignant neoplasm of bone: Secondary | ICD-10-CM | POA: Diagnosis not present

## 2017-10-14 DIAGNOSIS — Z803 Family history of malignant neoplasm of breast: Secondary | ICD-10-CM | POA: Diagnosis not present

## 2017-10-14 DIAGNOSIS — Z7982 Long term (current) use of aspirin: Secondary | ICD-10-CM | POA: Diagnosis not present

## 2017-10-14 DIAGNOSIS — Z79899 Other long term (current) drug therapy: Secondary | ICD-10-CM | POA: Diagnosis not present

## 2017-10-14 DIAGNOSIS — G629 Polyneuropathy, unspecified: Secondary | ICD-10-CM | POA: Diagnosis not present

## 2017-10-14 DIAGNOSIS — I10 Essential (primary) hypertension: Secondary | ICD-10-CM | POA: Diagnosis not present

## 2017-10-14 DIAGNOSIS — Z510 Encounter for antineoplastic radiation therapy: Secondary | ICD-10-CM | POA: Diagnosis not present

## 2017-10-14 DIAGNOSIS — Z170 Estrogen receptor positive status [ER+]: Secondary | ICD-10-CM | POA: Diagnosis not present

## 2017-10-14 DIAGNOSIS — Z809 Family history of malignant neoplasm, unspecified: Secondary | ICD-10-CM | POA: Diagnosis not present

## 2017-10-14 DIAGNOSIS — M129 Arthropathy, unspecified: Secondary | ICD-10-CM | POA: Diagnosis not present

## 2017-10-14 DIAGNOSIS — Z85828 Personal history of other malignant neoplasm of skin: Secondary | ICD-10-CM | POA: Diagnosis not present

## 2017-10-14 DIAGNOSIS — C50912 Malignant neoplasm of unspecified site of left female breast: Secondary | ICD-10-CM | POA: Diagnosis not present

## 2017-10-15 ENCOUNTER — Ambulatory Visit
Admission: RE | Admit: 2017-10-15 | Discharge: 2017-10-15 | Disposition: A | Discharge status: Home / Self Care | Payer: Medicare Other | Source: Ambulatory Visit | Attending: Radiation Oncology | Admitting: Radiation Oncology

## 2017-10-15 ENCOUNTER — Inpatient Hospital Stay: Payer: Medicare Other

## 2017-10-15 DIAGNOSIS — M129 Arthropathy, unspecified: Secondary | ICD-10-CM | POA: Diagnosis not present

## 2017-10-15 DIAGNOSIS — Z923 Personal history of irradiation: Secondary | ICD-10-CM | POA: Diagnosis not present

## 2017-10-15 DIAGNOSIS — I10 Essential (primary) hypertension: Secondary | ICD-10-CM | POA: Diagnosis not present

## 2017-10-15 DIAGNOSIS — G629 Polyneuropathy, unspecified: Secondary | ICD-10-CM | POA: Diagnosis not present

## 2017-10-15 DIAGNOSIS — Z809 Family history of malignant neoplasm, unspecified: Secondary | ICD-10-CM | POA: Diagnosis not present

## 2017-10-15 DIAGNOSIS — Z85828 Personal history of other malignant neoplasm of skin: Secondary | ICD-10-CM | POA: Diagnosis not present

## 2017-10-15 DIAGNOSIS — Z79899 Other long term (current) drug therapy: Secondary | ICD-10-CM | POA: Diagnosis not present

## 2017-10-15 DIAGNOSIS — Z7982 Long term (current) use of aspirin: Secondary | ICD-10-CM | POA: Diagnosis not present

## 2017-10-15 DIAGNOSIS — Z170 Estrogen receptor positive status [ER+]: Secondary | ICD-10-CM | POA: Diagnosis not present

## 2017-10-15 DIAGNOSIS — C50912 Malignant neoplasm of unspecified site of left female breast: Secondary | ICD-10-CM | POA: Diagnosis not present

## 2017-10-15 DIAGNOSIS — Z510 Encounter for antineoplastic radiation therapy: Secondary | ICD-10-CM | POA: Diagnosis not present

## 2017-10-15 DIAGNOSIS — K219 Gastro-esophageal reflux disease without esophagitis: Secondary | ICD-10-CM | POA: Diagnosis not present

## 2017-10-15 DIAGNOSIS — C7951 Secondary malignant neoplasm of bone: Secondary | ICD-10-CM | POA: Diagnosis not present

## 2017-10-15 DIAGNOSIS — I499 Cardiac arrhythmia, unspecified: Secondary | ICD-10-CM | POA: Diagnosis not present

## 2017-10-15 DIAGNOSIS — Z803 Family history of malignant neoplasm of breast: Secondary | ICD-10-CM | POA: Diagnosis not present

## 2017-10-16 ENCOUNTER — Inpatient Hospital Stay: Payer: Medicare Other

## 2017-10-16 ENCOUNTER — Ambulatory Visit
Admission: RE | Admit: 2017-10-16 | Discharge: 2017-10-16 | Disposition: A | Discharge status: Home / Self Care | Payer: Medicare Other | Source: Ambulatory Visit | Attending: Radiation Oncology | Admitting: Radiation Oncology

## 2017-10-16 DIAGNOSIS — Z510 Encounter for antineoplastic radiation therapy: Secondary | ICD-10-CM | POA: Diagnosis not present

## 2017-10-16 DIAGNOSIS — Z809 Family history of malignant neoplasm, unspecified: Secondary | ICD-10-CM | POA: Diagnosis not present

## 2017-10-16 DIAGNOSIS — Z7982 Long term (current) use of aspirin: Secondary | ICD-10-CM | POA: Diagnosis not present

## 2017-10-16 DIAGNOSIS — I499 Cardiac arrhythmia, unspecified: Secondary | ICD-10-CM | POA: Diagnosis not present

## 2017-10-16 DIAGNOSIS — C7951 Secondary malignant neoplasm of bone: Secondary | ICD-10-CM | POA: Diagnosis not present

## 2017-10-16 DIAGNOSIS — C50912 Malignant neoplasm of unspecified site of left female breast: Secondary | ICD-10-CM | POA: Diagnosis not present

## 2017-10-16 DIAGNOSIS — M129 Arthropathy, unspecified: Secondary | ICD-10-CM | POA: Diagnosis not present

## 2017-10-16 DIAGNOSIS — Z923 Personal history of irradiation: Secondary | ICD-10-CM | POA: Diagnosis not present

## 2017-10-16 DIAGNOSIS — Z79899 Other long term (current) drug therapy: Secondary | ICD-10-CM | POA: Diagnosis not present

## 2017-10-16 DIAGNOSIS — Z85828 Personal history of other malignant neoplasm of skin: Secondary | ICD-10-CM | POA: Diagnosis not present

## 2017-10-16 DIAGNOSIS — G629 Polyneuropathy, unspecified: Secondary | ICD-10-CM | POA: Diagnosis not present

## 2017-10-16 DIAGNOSIS — Z170 Estrogen receptor positive status [ER+]: Secondary | ICD-10-CM | POA: Diagnosis not present

## 2017-10-16 DIAGNOSIS — I10 Essential (primary) hypertension: Secondary | ICD-10-CM | POA: Diagnosis not present

## 2017-10-16 DIAGNOSIS — K219 Gastro-esophageal reflux disease without esophagitis: Secondary | ICD-10-CM | POA: Diagnosis not present

## 2017-10-16 DIAGNOSIS — Z803 Family history of malignant neoplasm of breast: Secondary | ICD-10-CM | POA: Diagnosis not present

## 2017-10-17 ENCOUNTER — Ambulatory Visit
Admission: RE | Admit: 2017-10-17 | Discharge: 2017-10-17 | Disposition: A | Discharge status: Home / Self Care | Payer: Medicare Other | Source: Ambulatory Visit | Attending: Radiation Oncology | Admitting: Radiation Oncology

## 2017-10-17 ENCOUNTER — Inpatient Hospital Stay: Payer: Medicare Other

## 2017-10-17 DIAGNOSIS — I10 Essential (primary) hypertension: Secondary | ICD-10-CM | POA: Diagnosis not present

## 2017-10-17 DIAGNOSIS — Z809 Family history of malignant neoplasm, unspecified: Secondary | ICD-10-CM | POA: Diagnosis not present

## 2017-10-17 DIAGNOSIS — I499 Cardiac arrhythmia, unspecified: Secondary | ICD-10-CM | POA: Diagnosis not present

## 2017-10-17 DIAGNOSIS — Z85828 Personal history of other malignant neoplasm of skin: Secondary | ICD-10-CM | POA: Diagnosis not present

## 2017-10-17 DIAGNOSIS — Z79899 Other long term (current) drug therapy: Secondary | ICD-10-CM | POA: Diagnosis not present

## 2017-10-17 DIAGNOSIS — Z7982 Long term (current) use of aspirin: Secondary | ICD-10-CM | POA: Diagnosis not present

## 2017-10-17 DIAGNOSIS — C7951 Secondary malignant neoplasm of bone: Secondary | ICD-10-CM | POA: Diagnosis not present

## 2017-10-17 DIAGNOSIS — M129 Arthropathy, unspecified: Secondary | ICD-10-CM | POA: Diagnosis not present

## 2017-10-17 DIAGNOSIS — Z510 Encounter for antineoplastic radiation therapy: Secondary | ICD-10-CM | POA: Diagnosis not present

## 2017-10-17 DIAGNOSIS — G629 Polyneuropathy, unspecified: Secondary | ICD-10-CM | POA: Diagnosis not present

## 2017-10-17 DIAGNOSIS — C50912 Malignant neoplasm of unspecified site of left female breast: Secondary | ICD-10-CM | POA: Diagnosis not present

## 2017-10-17 DIAGNOSIS — Z170 Estrogen receptor positive status [ER+]: Secondary | ICD-10-CM | POA: Diagnosis not present

## 2017-10-17 DIAGNOSIS — Z803 Family history of malignant neoplasm of breast: Secondary | ICD-10-CM | POA: Diagnosis not present

## 2017-10-17 DIAGNOSIS — K219 Gastro-esophageal reflux disease without esophagitis: Secondary | ICD-10-CM | POA: Diagnosis not present

## 2017-10-17 DIAGNOSIS — Z923 Personal history of irradiation: Secondary | ICD-10-CM | POA: Diagnosis not present

## 2017-10-20 ENCOUNTER — Inpatient Hospital Stay: Payer: Medicare Other

## 2017-10-20 ENCOUNTER — Ambulatory Visit
Admission: RE | Admit: 2017-10-20 | Discharge: 2017-10-20 | Disposition: A | Discharge status: Home / Self Care | Payer: Medicare Other | Source: Ambulatory Visit | Attending: Radiation Oncology | Admitting: Radiation Oncology

## 2017-10-20 DIAGNOSIS — Z79899 Other long term (current) drug therapy: Secondary | ICD-10-CM | POA: Diagnosis not present

## 2017-10-20 DIAGNOSIS — Z510 Encounter for antineoplastic radiation therapy: Secondary | ICD-10-CM | POA: Diagnosis not present

## 2017-10-20 DIAGNOSIS — Z923 Personal history of irradiation: Secondary | ICD-10-CM | POA: Diagnosis not present

## 2017-10-20 DIAGNOSIS — I10 Essential (primary) hypertension: Secondary | ICD-10-CM | POA: Diagnosis not present

## 2017-10-20 DIAGNOSIS — I499 Cardiac arrhythmia, unspecified: Secondary | ICD-10-CM | POA: Diagnosis not present

## 2017-10-20 DIAGNOSIS — Z803 Family history of malignant neoplasm of breast: Secondary | ICD-10-CM | POA: Diagnosis not present

## 2017-10-20 DIAGNOSIS — Z7982 Long term (current) use of aspirin: Secondary | ICD-10-CM | POA: Diagnosis not present

## 2017-10-20 DIAGNOSIS — K219 Gastro-esophageal reflux disease without esophagitis: Secondary | ICD-10-CM | POA: Diagnosis not present

## 2017-10-20 DIAGNOSIS — C50911 Malignant neoplasm of unspecified site of right female breast: Secondary | ICD-10-CM | POA: Diagnosis not present

## 2017-10-20 DIAGNOSIS — Z170 Estrogen receptor positive status [ER+]: Secondary | ICD-10-CM | POA: Diagnosis not present

## 2017-10-20 DIAGNOSIS — C7951 Secondary malignant neoplasm of bone: Secondary | ICD-10-CM | POA: Diagnosis not present

## 2017-10-20 DIAGNOSIS — Z809 Family history of malignant neoplasm, unspecified: Secondary | ICD-10-CM | POA: Diagnosis not present

## 2017-10-20 DIAGNOSIS — Z85828 Personal history of other malignant neoplasm of skin: Secondary | ICD-10-CM | POA: Diagnosis not present

## 2017-10-20 DIAGNOSIS — G629 Polyneuropathy, unspecified: Secondary | ICD-10-CM | POA: Diagnosis not present

## 2017-10-20 DIAGNOSIS — C50912 Malignant neoplasm of unspecified site of left female breast: Secondary | ICD-10-CM | POA: Diagnosis not present

## 2017-10-20 DIAGNOSIS — M129 Arthropathy, unspecified: Secondary | ICD-10-CM | POA: Diagnosis not present

## 2017-10-21 ENCOUNTER — Ambulatory Visit
Admission: RE | Admit: 2017-10-21 | Discharge: 2017-10-21 | Disposition: A | Discharge status: Home / Self Care | Payer: Medicare Other | Source: Ambulatory Visit | Attending: Radiation Oncology | Admitting: Radiation Oncology

## 2017-10-21 ENCOUNTER — Inpatient Hospital Stay: Payer: Medicare Other

## 2017-10-21 DIAGNOSIS — K219 Gastro-esophageal reflux disease without esophagitis: Secondary | ICD-10-CM | POA: Diagnosis not present

## 2017-10-21 DIAGNOSIS — Z809 Family history of malignant neoplasm, unspecified: Secondary | ICD-10-CM | POA: Diagnosis not present

## 2017-10-21 DIAGNOSIS — C7951 Secondary malignant neoplasm of bone: Secondary | ICD-10-CM | POA: Diagnosis not present

## 2017-10-21 DIAGNOSIS — C50912 Malignant neoplasm of unspecified site of left female breast: Secondary | ICD-10-CM | POA: Diagnosis not present

## 2017-10-21 DIAGNOSIS — Z803 Family history of malignant neoplasm of breast: Secondary | ICD-10-CM | POA: Diagnosis not present

## 2017-10-21 DIAGNOSIS — I10 Essential (primary) hypertension: Secondary | ICD-10-CM | POA: Diagnosis not present

## 2017-10-21 DIAGNOSIS — Z7982 Long term (current) use of aspirin: Secondary | ICD-10-CM | POA: Diagnosis not present

## 2017-10-21 DIAGNOSIS — Z170 Estrogen receptor positive status [ER+]: Secondary | ICD-10-CM | POA: Diagnosis not present

## 2017-10-21 DIAGNOSIS — Z923 Personal history of irradiation: Secondary | ICD-10-CM | POA: Diagnosis not present

## 2017-10-21 DIAGNOSIS — B372 Candidiasis of skin and nail: Secondary | ICD-10-CM | POA: Diagnosis not present

## 2017-10-21 DIAGNOSIS — Z85828 Personal history of other malignant neoplasm of skin: Secondary | ICD-10-CM | POA: Diagnosis not present

## 2017-10-21 DIAGNOSIS — Z79818 Long term (current) use of other agents affecting estrogen receptors and estrogen levels: Secondary | ICD-10-CM | POA: Diagnosis not present

## 2017-10-21 DIAGNOSIS — Z79899 Other long term (current) drug therapy: Secondary | ICD-10-CM | POA: Diagnosis not present

## 2017-10-21 DIAGNOSIS — Z9223 Personal history of estrogen therapy: Secondary | ICD-10-CM | POA: Diagnosis not present

## 2017-10-21 DIAGNOSIS — M129 Arthropathy, unspecified: Secondary | ICD-10-CM | POA: Diagnosis not present

## 2017-10-21 DIAGNOSIS — Z880 Allergy status to penicillin: Secondary | ICD-10-CM | POA: Diagnosis not present

## 2017-10-21 DIAGNOSIS — I499 Cardiac arrhythmia, unspecified: Secondary | ICD-10-CM | POA: Diagnosis not present

## 2017-10-21 DIAGNOSIS — C50412 Malignant neoplasm of upper-outer quadrant of left female breast: Secondary | ICD-10-CM | POA: Diagnosis not present

## 2017-10-21 DIAGNOSIS — Z510 Encounter for antineoplastic radiation therapy: Secondary | ICD-10-CM | POA: Diagnosis not present

## 2017-10-21 DIAGNOSIS — Z853 Personal history of malignant neoplasm of breast: Secondary | ICD-10-CM | POA: Diagnosis not present

## 2017-10-21 DIAGNOSIS — C773 Secondary and unspecified malignant neoplasm of axilla and upper limb lymph nodes: Secondary | ICD-10-CM | POA: Diagnosis not present

## 2017-10-21 DIAGNOSIS — N6489 Other specified disorders of breast: Secondary | ICD-10-CM | POA: Diagnosis not present

## 2017-10-21 DIAGNOSIS — G629 Polyneuropathy, unspecified: Secondary | ICD-10-CM | POA: Diagnosis not present

## 2017-10-21 DIAGNOSIS — K580 Irritable bowel syndrome with diarrhea: Secondary | ICD-10-CM | POA: Diagnosis not present

## 2017-10-21 LAB — CBC
HCT: 35.6 % (ref 35.0–47.0)
Hemoglobin: 11.9 g/dL — ABNORMAL LOW (ref 12.0–16.0)
MCH: 29.3 pg (ref 26.0–34.0)
MCHC: 33.4 g/dL (ref 32.0–36.0)
MCV: 87.5 fL (ref 80.0–100.0)
Platelets: 318 10*3/uL (ref 150–440)
RBC: 4.07 MIL/uL (ref 3.80–5.20)
RDW: 13.9 % (ref 11.5–14.5)
WBC: 6.7 10*3/uL (ref 3.6–11.0)

## 2017-10-22 ENCOUNTER — Inpatient Hospital Stay: Payer: Self-pay

## 2017-10-22 ENCOUNTER — Inpatient Hospital Stay: Payer: Medicare Other

## 2017-10-22 ENCOUNTER — Encounter: Payer: Self-pay | Admitting: General Surgery

## 2017-10-22 ENCOUNTER — Ambulatory Visit (INDEPENDENT_AMBULATORY_CARE_PROVIDER_SITE_OTHER): Payer: Medicare Other | Admitting: General Surgery

## 2017-10-22 ENCOUNTER — Ambulatory Visit
Admission: RE | Admit: 2017-10-22 | Discharge: 2017-10-22 | Disposition: A | Discharge status: Home / Self Care | Payer: Medicare Other | Source: Ambulatory Visit | Attending: Radiation Oncology | Admitting: Radiation Oncology

## 2017-10-22 VITALS — BP 116/64 | HR 58 | Resp 12 | Ht 65.0 in | Wt 199.0 lb

## 2017-10-22 DIAGNOSIS — Z85828 Personal history of other malignant neoplasm of skin: Secondary | ICD-10-CM | POA: Diagnosis not present

## 2017-10-22 DIAGNOSIS — C50412 Malignant neoplasm of upper-outer quadrant of left female breast: Secondary | ICD-10-CM | POA: Diagnosis not present

## 2017-10-22 DIAGNOSIS — L7634 Postprocedural seroma of skin and subcutaneous tissue following other procedure: Secondary | ICD-10-CM | POA: Diagnosis not present

## 2017-10-22 DIAGNOSIS — Z923 Personal history of irradiation: Secondary | ICD-10-CM | POA: Diagnosis not present

## 2017-10-22 DIAGNOSIS — Z170 Estrogen receptor positive status [ER+]: Secondary | ICD-10-CM

## 2017-10-22 DIAGNOSIS — Z803 Family history of malignant neoplasm of breast: Secondary | ICD-10-CM | POA: Diagnosis not present

## 2017-10-22 DIAGNOSIS — I10 Essential (primary) hypertension: Secondary | ICD-10-CM | POA: Diagnosis not present

## 2017-10-22 DIAGNOSIS — Z79899 Other long term (current) drug therapy: Secondary | ICD-10-CM | POA: Diagnosis not present

## 2017-10-22 DIAGNOSIS — I499 Cardiac arrhythmia, unspecified: Secondary | ICD-10-CM | POA: Diagnosis not present

## 2017-10-22 DIAGNOSIS — Z7982 Long term (current) use of aspirin: Secondary | ICD-10-CM | POA: Diagnosis not present

## 2017-10-22 DIAGNOSIS — C50912 Malignant neoplasm of unspecified site of left female breast: Secondary | ICD-10-CM | POA: Diagnosis not present

## 2017-10-22 DIAGNOSIS — Z809 Family history of malignant neoplasm, unspecified: Secondary | ICD-10-CM | POA: Diagnosis not present

## 2017-10-22 DIAGNOSIS — M129 Arthropathy, unspecified: Secondary | ICD-10-CM | POA: Diagnosis not present

## 2017-10-22 DIAGNOSIS — C7951 Secondary malignant neoplasm of bone: Secondary | ICD-10-CM | POA: Diagnosis not present

## 2017-10-22 DIAGNOSIS — Z510 Encounter for antineoplastic radiation therapy: Secondary | ICD-10-CM | POA: Diagnosis not present

## 2017-10-22 DIAGNOSIS — K219 Gastro-esophageal reflux disease without esophagitis: Secondary | ICD-10-CM | POA: Diagnosis not present

## 2017-10-22 DIAGNOSIS — G629 Polyneuropathy, unspecified: Secondary | ICD-10-CM | POA: Diagnosis not present

## 2017-10-22 NOTE — Progress Notes (Signed)
Patient ID: Kelli Williams, female   DOB: 1926/10/26, 83 y.o.   MRN: [id number]  Chief Complaint  Patient presents with  . Other    HPI Kelli Williams is a 83 y.o. female.  Here for follow up left breast cancer and hematoma. She just came from hip radiation today. She states she thinks the area is a little larger. She has been using heat to the area.  She is here with her daughter, Earlie Server,  from [place].  HPI  Past Medical History:  Diagnosis Date  . Anxiety   . Arthritis    BOTH FEET AND KNEES  . Breast cancer ([place]) 2000   left breast ca with lumpectomy and rad tx 5 yr tamoxifen/Dr Jeannine Kitten at [facility]  . Complication of anesthesia    WOKE UP DURING SURGERY FOR DEVIATED SEPTUM, KNEE REPLACEMENT AND BREAST LUMPECTOMY  . Dysrhythmia   . GERD (gastroesophageal reflux disease)   . Hypertension   . Neuropathic pain of both legs   . Neuropathy   . Pneumonia 2016  . Skin cancer of forehead   . Skin cancer of nose     Past Surgical History:  Procedure Laterality Date  . ABDOMINAL HYSTERECTOMY    . AXILLARY LYMPH NODE BIOPSY Left 07/10/2017   INVASIVE MAMMARY CARCINOMA WITH FOCAL LOBULAR FEATURES.   Marland Kitchen BREAST BIOPSY Bilateral 1960   benign  . BREAST LUMPECTOMY Left 2000   breast ca with rad tx  . BREAST LUMPECTOMY Left 08/20/2017  . CATARACT EXTRACTION     left eye  . CHOLECYSTECTOMY    . JOINT REPLACEMENT    . NASAL SEPTUM SURGERY    . PARTIAL KNEE ARTHROPLASTY     right  . TOTAL VAGINAL HYSTERECTOMY      Family History  Problem Relation Age of Onset  . Heart disease Mother   . Heart attack Paternal Uncle   . Breast cancer Daughter 90       genetic negative  . Cancer Paternal Grandmother     Social History Social History   Tobacco Use  . Smoking status: Never Smoker  . Smokeless tobacco: Never Used  Substance Use Topics  . Alcohol use: No    Alcohol/week: 0.0 oz  . Drug use: No    Allergies  Allergen Reactions  . Amitriptyline Hives  . Amoxicillin    Diarrhea    . Ivp Dye [Iodinated Diagnostic Agents]   . Sulfa Antibiotics Rash  . Sulfa Antibiotics Rash    Current Outpatient Medications  Medication Sig Dispense Refill  . acetaminophen (TYLENOL) 325 MG tablet Take 650 mg by mouth every 6 (six) hours as needed.    Marland Kitchen aspirin 81 MG tablet Take 1 tablet by mouth daily.    . Calcium-Magnesium 500-250 MG TABS Take 1 tablet by mouth daily.     . Coenzyme Q10 (CO Q-10) 100 MG CAPS Take by mouth daily.    Marland Kitchen gabapentin (NEURONTIN) 300 MG capsule Take 3 capsules (900 mg total) by mouth 2 (two) times daily. 540 capsule 3  . lisinopril-hydrochlorothiazide (PRINZIDE,ZESTORETIC) 20-25 MG tablet TAKE 1 AND 1/2 TABLETS BY  MOUTH DAILY 135 tablet 3  . meloxicam (MOBIC) 15 MG tablet Take 1 tablet (15 mg total) by mouth daily. 90 tablet 1  . Multiple Vitamins-Minerals (MULTIVITAMIN WITH MINERALS) tablet Take 1 tablet by mouth daily.    Marland Kitchen omeprazole (PRILOSEC) 20 MG capsule Take 1 capsule (20 mg total) by mouth daily. 90 capsule 1  . ondansetron (ZOFRAN-ODT)  4 MG disintegrating tablet Take 1 tablet (4 mg total) by mouth every 8 (eight) hours as needed for nausea or vomiting. 20 tablet 0  . propranolol (INDERAL) 10 MG tablet TAKE 1 TABLET BY MOUTH UP  TO 3 TIMES DAILY AS NEEDED 270 tablet 1   No current facility-administered medications for this visit.     Review of Systems Review of Systems  Constitutional: Negative.   Respiratory: Negative.   Cardiovascular: Negative.     Blood pressure 116/64, pulse (!) 58, resp. rate 12, height 5\' 5"  (1.651 m), weight 199 lb (90.3 kg).  Physical Exam Physical Exam  Constitutional: She is oriented to person, place, and time. She appears well-developed and well-nourished.  Cardiovascular: Normal rate, regular rhythm and normal heart sounds.  Pulmonary/Chest: Effort normal and breath sounds normal.    4 x 7 cm fullness above incision left axilla. Rash noted under left breast  Neurological: She is alert and  oriented to person, place, and time.  Skin: Skin is warm and dry.  Psychiatric: Her behavior is normal.    Data Reviewed PET scan of September 16, 2017 describes a 4.7 x 5.2 cm left axillary seroma.  This was not clinically evident on her exam 1 week earlier.  Ultrasound examination of the left axilla showed a complex multilobulated mass measuring 2.09 x 2.51 x 3.16 cm likely representing a hematoma/seroma with consolidation.  Edema of the adjacent soft tissue extends about a centimeter from the mass.  Minimal liquid component making aspiration unlikely to be of benefit.  Assessment    Axillary seroma post node excision.    Plan    This developed fairly late, and was not evident on her clinical exam here 5 weeks ago.  Options for management are limited to continued observation/local heat versus operative drainage.  This would include placement of a drain to minimize recurrence.  My initial concern was a rapid recurrence post node excision, but based on the PET/CT of October 9 unless concern.  The patient does have a new rash under her breast and while this is hopefully just a "rash" with her widespread metastatic disease possibility of dermal metastatic disease must be considered.  I do not think biopsy would change treatment options at this time and was not discussed.   Follow up as needed or if she decides to have surgical intervention    HPI, Physical Exam, Assessment and Plan have been scribed under the direction and in the presence of Robert Bellow, MD. Karie Fetch, RN  I have completed the exam and reviewed the above documentation for accuracy and completeness.  I agree with the above.  Haematologist has been used and any errors in dictation or transcription are unintentional.  Hervey Ard, M.D., F.A.C.S.  Robert Bellow 10/22/2017, 4:51 PM

## 2017-10-22 NOTE — Patient Instructions (Signed)
The patient is aware to call back for any questions or concerns.  

## 2017-10-23 ENCOUNTER — Inpatient Hospital Stay: Payer: Medicare Other

## 2017-10-23 ENCOUNTER — Ambulatory Visit
Admission: RE | Admit: 2017-10-23 | Discharge: 2017-10-23 | Disposition: A | Discharge status: Home / Self Care | Payer: Medicare Other | Source: Ambulatory Visit | Attending: Radiation Oncology | Admitting: Radiation Oncology

## 2017-10-23 DIAGNOSIS — M129 Arthropathy, unspecified: Secondary | ICD-10-CM | POA: Diagnosis not present

## 2017-10-23 DIAGNOSIS — C7951 Secondary malignant neoplasm of bone: Secondary | ICD-10-CM | POA: Diagnosis not present

## 2017-10-23 DIAGNOSIS — Z170 Estrogen receptor positive status [ER+]: Secondary | ICD-10-CM | POA: Diagnosis not present

## 2017-10-23 DIAGNOSIS — Z923 Personal history of irradiation: Secondary | ICD-10-CM | POA: Diagnosis not present

## 2017-10-23 DIAGNOSIS — C50912 Malignant neoplasm of unspecified site of left female breast: Secondary | ICD-10-CM | POA: Diagnosis not present

## 2017-10-23 DIAGNOSIS — Z79899 Other long term (current) drug therapy: Secondary | ICD-10-CM | POA: Diagnosis not present

## 2017-10-23 DIAGNOSIS — Z803 Family history of malignant neoplasm of breast: Secondary | ICD-10-CM | POA: Diagnosis not present

## 2017-10-23 DIAGNOSIS — Z809 Family history of malignant neoplasm, unspecified: Secondary | ICD-10-CM | POA: Diagnosis not present

## 2017-10-23 DIAGNOSIS — Z85828 Personal history of other malignant neoplasm of skin: Secondary | ICD-10-CM | POA: Diagnosis not present

## 2017-10-23 DIAGNOSIS — K219 Gastro-esophageal reflux disease without esophagitis: Secondary | ICD-10-CM | POA: Diagnosis not present

## 2017-10-23 DIAGNOSIS — Z510 Encounter for antineoplastic radiation therapy: Secondary | ICD-10-CM | POA: Diagnosis not present

## 2017-10-23 DIAGNOSIS — Z7982 Long term (current) use of aspirin: Secondary | ICD-10-CM | POA: Diagnosis not present

## 2017-10-23 DIAGNOSIS — G629 Polyneuropathy, unspecified: Secondary | ICD-10-CM | POA: Diagnosis not present

## 2017-10-23 DIAGNOSIS — I10 Essential (primary) hypertension: Secondary | ICD-10-CM | POA: Diagnosis not present

## 2017-10-23 DIAGNOSIS — I499 Cardiac arrhythmia, unspecified: Secondary | ICD-10-CM | POA: Diagnosis not present

## 2017-10-24 ENCOUNTER — Inpatient Hospital Stay: Payer: Medicare Other

## 2017-10-24 ENCOUNTER — Ambulatory Visit
Admission: RE | Admit: 2017-10-24 | Discharge: 2017-10-24 | Disposition: A | Discharge status: Home / Self Care | Payer: Medicare Other | Source: Ambulatory Visit | Attending: Radiation Oncology | Admitting: Radiation Oncology

## 2017-10-24 DIAGNOSIS — Z923 Personal history of irradiation: Secondary | ICD-10-CM | POA: Diagnosis not present

## 2017-10-24 DIAGNOSIS — K219 Gastro-esophageal reflux disease without esophagitis: Secondary | ICD-10-CM | POA: Diagnosis not present

## 2017-10-24 DIAGNOSIS — Z79899 Other long term (current) drug therapy: Secondary | ICD-10-CM | POA: Diagnosis not present

## 2017-10-24 DIAGNOSIS — Z510 Encounter for antineoplastic radiation therapy: Secondary | ICD-10-CM | POA: Diagnosis not present

## 2017-10-24 DIAGNOSIS — G629 Polyneuropathy, unspecified: Secondary | ICD-10-CM | POA: Diagnosis not present

## 2017-10-24 DIAGNOSIS — C50912 Malignant neoplasm of unspecified site of left female breast: Secondary | ICD-10-CM | POA: Diagnosis not present

## 2017-10-24 DIAGNOSIS — C7951 Secondary malignant neoplasm of bone: Secondary | ICD-10-CM | POA: Diagnosis not present

## 2017-10-24 DIAGNOSIS — Z170 Estrogen receptor positive status [ER+]: Secondary | ICD-10-CM | POA: Diagnosis not present

## 2017-10-24 DIAGNOSIS — M129 Arthropathy, unspecified: Secondary | ICD-10-CM | POA: Diagnosis not present

## 2017-10-24 DIAGNOSIS — Z7982 Long term (current) use of aspirin: Secondary | ICD-10-CM | POA: Diagnosis not present

## 2017-10-24 DIAGNOSIS — Z85828 Personal history of other malignant neoplasm of skin: Secondary | ICD-10-CM | POA: Diagnosis not present

## 2017-10-24 DIAGNOSIS — Z809 Family history of malignant neoplasm, unspecified: Secondary | ICD-10-CM | POA: Diagnosis not present

## 2017-10-24 DIAGNOSIS — I499 Cardiac arrhythmia, unspecified: Secondary | ICD-10-CM | POA: Diagnosis not present

## 2017-10-24 DIAGNOSIS — I10 Essential (primary) hypertension: Secondary | ICD-10-CM | POA: Diagnosis not present

## 2017-10-24 DIAGNOSIS — Z803 Family history of malignant neoplasm of breast: Secondary | ICD-10-CM | POA: Diagnosis not present

## 2017-10-27 ENCOUNTER — Ambulatory Visit
Admission: RE | Admit: 2017-10-27 | Discharge: 2017-10-27 | Disposition: A | Discharge status: Home / Self Care | Payer: Medicare Other | Source: Ambulatory Visit | Attending: Radiation Oncology | Admitting: Radiation Oncology

## 2017-10-27 ENCOUNTER — Telehealth: Payer: Self-pay | Admitting: *Deleted

## 2017-10-27 ENCOUNTER — Inpatient Hospital Stay: Payer: Medicare Other

## 2017-10-27 DIAGNOSIS — Z809 Family history of malignant neoplasm, unspecified: Secondary | ICD-10-CM | POA: Diagnosis not present

## 2017-10-27 DIAGNOSIS — C50911 Malignant neoplasm of unspecified site of right female breast: Secondary | ICD-10-CM | POA: Diagnosis not present

## 2017-10-27 DIAGNOSIS — Z510 Encounter for antineoplastic radiation therapy: Secondary | ICD-10-CM | POA: Diagnosis not present

## 2017-10-27 DIAGNOSIS — Z7982 Long term (current) use of aspirin: Secondary | ICD-10-CM | POA: Diagnosis not present

## 2017-10-27 DIAGNOSIS — G629 Polyneuropathy, unspecified: Secondary | ICD-10-CM | POA: Diagnosis not present

## 2017-10-27 DIAGNOSIS — K219 Gastro-esophageal reflux disease without esophagitis: Secondary | ICD-10-CM | POA: Diagnosis not present

## 2017-10-27 DIAGNOSIS — Z85828 Personal history of other malignant neoplasm of skin: Secondary | ICD-10-CM | POA: Diagnosis not present

## 2017-10-27 DIAGNOSIS — M129 Arthropathy, unspecified: Secondary | ICD-10-CM | POA: Diagnosis not present

## 2017-10-27 DIAGNOSIS — I10 Essential (primary) hypertension: Secondary | ICD-10-CM | POA: Diagnosis not present

## 2017-10-27 DIAGNOSIS — Z803 Family history of malignant neoplasm of breast: Secondary | ICD-10-CM | POA: Diagnosis not present

## 2017-10-27 DIAGNOSIS — C7951 Secondary malignant neoplasm of bone: Secondary | ICD-10-CM | POA: Diagnosis not present

## 2017-10-27 DIAGNOSIS — I499 Cardiac arrhythmia, unspecified: Secondary | ICD-10-CM | POA: Diagnosis not present

## 2017-10-27 DIAGNOSIS — C50912 Malignant neoplasm of unspecified site of left female breast: Secondary | ICD-10-CM | POA: Diagnosis not present

## 2017-10-27 DIAGNOSIS — Z923 Personal history of irradiation: Secondary | ICD-10-CM | POA: Diagnosis not present

## 2017-10-27 DIAGNOSIS — Z170 Estrogen receptor positive status [ER+]: Secondary | ICD-10-CM | POA: Diagnosis not present

## 2017-10-27 DIAGNOSIS — Z79899 Other long term (current) drug therapy: Secondary | ICD-10-CM | POA: Diagnosis not present

## 2017-10-27 NOTE — Telephone Encounter (Signed)
  Please call patient.  She should have a follow-up appointment.  M

## 2017-10-27 NOTE — Telephone Encounter (Signed)
Daughter called with questions regarding medicine, Asking if patient is to be on both oral and injection of medicine stating Dr Bary Castilla said she should be. She just completed her radiation therapy today, and does NOT have a follow up appointment with Dr Humberto Seals. Please advise.

## 2017-10-27 NOTE — Telephone Encounter (Signed)
  Next available.  M

## 2017-10-27 NOTE — Telephone Encounter (Signed)
Patient last saw Dr. Donella Stade today.  Has follow up with him on 12-10-17.  When do you want to see her?

## 2017-10-27 NOTE — Telephone Encounter (Signed)
She is not scheduled for any with you

## 2017-10-28 ENCOUNTER — Encounter (INDEPENDENT_AMBULATORY_CARE_PROVIDER_SITE_OTHER): Payer: Medicare Other | Admitting: Internal Medicine

## 2017-10-28 DIAGNOSIS — K219 Gastro-esophageal reflux disease without esophagitis: Secondary | ICD-10-CM | POA: Insufficient documentation

## 2017-10-28 DIAGNOSIS — I10 Essential (primary) hypertension: Secondary | ICD-10-CM

## 2017-10-28 DIAGNOSIS — C7951 Secondary malignant neoplasm of bone: Secondary | ICD-10-CM

## 2017-10-28 DIAGNOSIS — K2100 Gastro-esophageal reflux disease with esophagitis, without bleeding: Secondary | ICD-10-CM

## 2017-10-28 DIAGNOSIS — C50412 Malignant neoplasm of upper-outer quadrant of left female breast: Secondary | ICD-10-CM | POA: Diagnosis not present

## 2017-10-28 NOTE — Telephone Encounter (Signed)
Message sent to scheduling 

## 2017-10-28 NOTE — Progress Notes (Signed)
Received home health orders form [facility]. Start if care 10/09/17 through 12/07/17. Orders are reviewed, signed and faxed.

## 2017-11-06 ENCOUNTER — Inpatient Hospital Stay: Payer: Medicare Other

## 2017-11-06 ENCOUNTER — Inpatient Hospital Stay (HOSPITAL_BASED_OUTPATIENT_CLINIC_OR_DEPARTMENT_OTHER): Payer: Medicare Other | Admitting: Hematology and Oncology

## 2017-11-06 ENCOUNTER — Encounter: Payer: Self-pay | Admitting: Hematology and Oncology

## 2017-11-06 ENCOUNTER — Other Ambulatory Visit: Payer: Self-pay | Admitting: Urgent Care

## 2017-11-06 ENCOUNTER — Other Ambulatory Visit: Payer: Self-pay

## 2017-11-06 ENCOUNTER — Other Ambulatory Visit: Payer: Self-pay | Admitting: Hematology and Oncology

## 2017-11-06 VITALS — BP 137/71 | HR 50 | Temp 98.2°F | Resp 12 | Ht 65.0 in | Wt 199.1 lb

## 2017-11-06 DIAGNOSIS — K219 Gastro-esophageal reflux disease without esophagitis: Secondary | ICD-10-CM

## 2017-11-06 DIAGNOSIS — B372 Candidiasis of skin and nail: Secondary | ICD-10-CM

## 2017-11-06 DIAGNOSIS — Z7982 Long term (current) use of aspirin: Secondary | ICD-10-CM

## 2017-11-06 DIAGNOSIS — Z853 Personal history of malignant neoplasm of breast: Secondary | ICD-10-CM | POA: Diagnosis not present

## 2017-11-06 DIAGNOSIS — C50412 Malignant neoplasm of upper-outer quadrant of left female breast: Secondary | ICD-10-CM

## 2017-11-06 DIAGNOSIS — Z79818 Long term (current) use of other agents affecting estrogen receptors and estrogen levels: Secondary | ICD-10-CM

## 2017-11-06 DIAGNOSIS — Z9223 Personal history of estrogen therapy: Secondary | ICD-10-CM | POA: Diagnosis not present

## 2017-11-06 DIAGNOSIS — Z170 Estrogen receptor positive status [ER+]: Secondary | ICD-10-CM

## 2017-11-06 DIAGNOSIS — N6489 Other specified disorders of breast: Secondary | ICD-10-CM | POA: Diagnosis not present

## 2017-11-06 DIAGNOSIS — Z803 Family history of malignant neoplasm of breast: Secondary | ICD-10-CM

## 2017-11-06 DIAGNOSIS — M85851 Other specified disorders of bone density and structure, right thigh: Secondary | ICD-10-CM

## 2017-11-06 DIAGNOSIS — Z79899 Other long term (current) drug therapy: Secondary | ICD-10-CM

## 2017-11-06 DIAGNOSIS — Z923 Personal history of irradiation: Secondary | ICD-10-CM

## 2017-11-06 DIAGNOSIS — Z85828 Personal history of other malignant neoplasm of skin: Secondary | ICD-10-CM

## 2017-11-06 DIAGNOSIS — C7951 Secondary malignant neoplasm of bone: Secondary | ICD-10-CM | POA: Diagnosis not present

## 2017-11-06 DIAGNOSIS — I10 Essential (primary) hypertension: Secondary | ICD-10-CM | POA: Diagnosis not present

## 2017-11-06 DIAGNOSIS — K580 Irritable bowel syndrome with diarrhea: Secondary | ICD-10-CM

## 2017-11-06 DIAGNOSIS — Z880 Allergy status to penicillin: Secondary | ICD-10-CM | POA: Diagnosis not present

## 2017-11-06 DIAGNOSIS — C773 Secondary and unspecified malignant neoplasm of axilla and upper limb lymph nodes: Secondary | ICD-10-CM | POA: Diagnosis not present

## 2017-11-06 DIAGNOSIS — Z7189 Other specified counseling: Secondary | ICD-10-CM

## 2017-11-06 DIAGNOSIS — B379 Candidiasis, unspecified: Secondary | ICD-10-CM

## 2017-11-06 LAB — CBC WITH DIFFERENTIAL/PLATELET
Basophils Absolute: 0.1 10*3/uL (ref 0–0.1)
Basophils Relative: 1 %
Eosinophils Absolute: 0.3 10*3/uL (ref 0–0.7)
Eosinophils Relative: 5 %
HCT: 37.3 % (ref 35.0–47.0)
Hemoglobin: 12.4 g/dL (ref 12.0–16.0)
Lymphocytes Relative: 12 %
Lymphs Abs: 0.8 10*3/uL — ABNORMAL LOW (ref 1.0–3.6)
MCH: 29 pg (ref 26.0–34.0)
MCHC: 33.3 g/dL (ref 32.0–36.0)
MCV: 86.9 fL (ref 80.0–100.0)
Monocytes Absolute: 0.5 10*3/uL (ref 0.2–0.9)
Monocytes Relative: 8 %
Neutro Abs: 5.1 10*3/uL (ref 1.4–6.5)
Neutrophils Relative %: 74 %
Platelets: 247 10*3/uL (ref 150–440)
RBC: 4.29 MIL/uL (ref 3.80–5.20)
RDW: 14 % (ref 11.5–14.5)
WBC: 6.9 10*3/uL (ref 3.6–11.0)

## 2017-11-06 LAB — COMPREHENSIVE METABOLIC PANEL
ALT: 13 U/L — ABNORMAL LOW (ref 14–54)
AST: 22 U/L (ref 15–41)
Albumin: 3.8 g/dL (ref 3.5–5.0)
Alkaline Phosphatase: 83 U/L (ref 38–126)
Anion gap: 8 (ref 5–15)
BUN: 27 mg/dL — ABNORMAL HIGH (ref 6–20)
CO2: 25 mmol/L (ref 22–32)
Calcium: 8.5 mg/dL — ABNORMAL LOW (ref 8.9–10.3)
Chloride: 101 mmol/L (ref 101–111)
Creatinine, Ser: 0.84 mg/dL (ref 0.44–1.00)
GFR calc Af Amer: >60 mL/min (ref >60)
GFR calc non Af Amer: 60 mL/min — ABNORMAL LOW (ref >60)
Glucose, Bld: 94 mg/dL (ref 65–99)
Potassium: 3.9 mmol/L (ref 3.5–5.1)
Sodium: 134 mmol/L — ABNORMAL LOW (ref 135–145)
Total Bilirubin: 0.5 mg/dL (ref 0.3–1.2)
Total Protein: 7.4 g/dL (ref 6.5–8.1)

## 2017-11-06 MED ORDER — NYSTATIN 100000 UNIT/GM EX POWD: Freq: Four times a day (QID) | CUTANEOUS | 3 refills | Status: DC

## 2017-11-06 MED ORDER — FULVESTRANT 250 MG/5ML IM SOLN
500.0000 mg | Freq: Once | INTRAMUSCULAR | Status: AC
  Administered 2017-11-06: 500 mg via INTRAMUSCULAR

## 2017-11-06 NOTE — Progress Notes (Signed)
Patient here for follow up she reports urinary urgency since  Her radiation treatment.

## 2017-11-06 NOTE — Progress Notes (Addendum)
[facility] day:  11/06/2017  Chief Complaint: Kelli Williams is a 83 y.o. female with a history of stage I left breast cancer (2000) who is seen for continuation of Faslodex and Xgeva.  HPI:  The patient was last seen in the medical oncology clinic on 09/23/2017.  At that time, PET scan revealed widespread osseous metastatic disease.  She noted chronic left hip and knee issues.  We discussed the potential risk for pathologic fracture in bilateral proximal femurs.  She was referred to radiation oncology.  We discussed monthly Xgeva.  We discussed treatment options with tamoxifen, aromatase inhibitor (Femara), or estrogen receptor antagonist (Faslodex).  We discussed the potential use of a CDK 4/6 inhibitor (Ibrance or Kisqali) with hormonal therapy.  She was seen by Dr. Baruch Gouty on 10/01/2017.  Plan was for a palliative course of radiation (3000 cGy in 10 fractions) to the left hip.  She received radiation from 10/14/2017 - 10/27/2017. She is scheduled to follow up on 12/27/2017.  She received Faslodex and Xgeva on 09/26/2017.  She completed Faslodex load on 10/13/2017.  She contacted the clinic on 10/02/2017 with feeling bad s/p her first injections.  She was "sick on her stomach" and had some diarrhea.  She was "a little wiped out".  The following day she noted hair loss and irritated IBS symptoms. Ondansetron and Loperamide were recommended.  She was seen by Dr. Bary Castilla on 10/22/2017.  Ultrasound examination of the left axilla showed a complex multilobulated mass measuring 2.09 x 2.51 x 3.16 cm likely representing a hematoma/seroma with consolidation.  Edema of the adjacent soft tissue extended about a centimeter from the mass.  There was minimal liquid component making aspiration unlikely to be of benefit.  Management options included observation/local heat versus operative drainage.  During the interim, patient has been doing "ok". She noted some  diarrhea following the Faslodex, which resolved fairly quickly. She denies experiencing this symptoms following the second injection. She continues to take the recommended calcium and vitamin D. She denies any breast symptoms today. Patient has not experienced any B symptoms or interval infections.    Past Medical History:  Diagnosis Date  . Anxiety   . Arthritis    BOTH FEET AND KNEES  . Breast cancer ([place]) 2000   left breast ca with lumpectomy and rad tx 5 yr tamoxifen/Dr Jeannine Kitten at [facility]  . Complication of anesthesia    WOKE UP DURING SURGERY FOR DEVIATED SEPTUM, KNEE REPLACEMENT AND BREAST LUMPECTOMY  . Dysrhythmia   . GERD (gastroesophageal reflux disease)   . Hypertension   . Neuropathic pain of both legs   . Neuropathy   . Pneumonia 2016  . Skin cancer of forehead   . Skin cancer of nose     Past Surgical History:  Procedure Laterality Date  . ABDOMINAL HYSTERECTOMY    . AXILLARY LYMPH NODE BIOPSY Left 07/10/2017   INVASIVE MAMMARY CARCINOMA WITH FOCAL LOBULAR FEATURES.   Marland Kitchen BREAST BIOPSY Bilateral 1960   benign  . BREAST LUMPECTOMY Left 2000   breast ca with rad tx  . BREAST LUMPECTOMY Left 08/20/2017  . BREAST LUMPECTOMY Left 08/20/2017   Procedure: left breast wide excision;  Surgeon: Robert Bellow, MD;  Location: [facility] ORS;  Service: General;  Laterality: Left;  . CATARACT EXTRACTION     left eye  . CHOLECYSTECTOMY    . JOINT REPLACEMENT    . NASAL SEPTUM SURGERY    . PARTIAL KNEE  ARTHROPLASTY     right  . TOTAL VAGINAL HYSTERECTOMY      Family History  Problem Relation Age of Onset  . Heart disease Mother   . Heart attack Paternal Uncle   . Breast cancer Daughter 80       genetic negative  . Cancer Paternal Grandmother     Social History:  reports that  has never smoked. she has never used smokeless tobacco. She reports that she does not drink alcohol or use drugs.  The patient's husband died 2 years ago.  She was born outside of [place].  She  previously worked as an Web designer.  She lives at the [facility] at [place].  She is independent except for the use of her walker. Patient has one daughter [name]) The patient is alone today.  Allergies:  Allergies  Allergen Reactions  . Amitriptyline Hives  . Amoxicillin     Diarrhea    . Ivp Dye [Iodinated Diagnostic Agents]   . Sulfa Antibiotics Rash  . Sulfa Antibiotics Rash    Current Medications: Current Outpatient Medications  Medication Sig Dispense Refill  . acetaminophen (TYLENOL) 325 MG tablet Take 650 mg by mouth every 6 (six) hours as needed.    Marland Kitchen aspirin 81 MG tablet Take 1 tablet by mouth daily.    . Calcium-Magnesium 500-250 MG TABS Take 1 tablet by mouth daily.     . Coenzyme Q10 (CO Q-10) 100 MG CAPS Take by mouth daily.    Marland Kitchen gabapentin (NEURONTIN) 300 MG capsule Take 3 capsules (900 mg total) by mouth 2 (two) times daily. 540 capsule 3  . lisinopril-hydrochlorothiazide (PRINZIDE,ZESTORETIC) 20-25 MG tablet TAKE 1 AND 1/2 TABLETS BY  MOUTH DAILY 135 tablet 3  . meloxicam (MOBIC) 15 MG tablet Take 1 tablet (15 mg total) by mouth daily. 90 tablet 1  . Multiple Vitamins-Minerals (MULTIVITAMIN WITH MINERALS) tablet Take 1 tablet by mouth daily.    Marland Kitchen omeprazole (PRILOSEC) 20 MG capsule Take 1 capsule (20 mg total) by mouth daily. 90 capsule 1   No current facility-administered medications for this visit.     Review of Systems:  GENERAL:  Feels "ok".  No fevers or sweats.  Weight stable. PERFORMANCE STATUS (ECOG):  2 HEENT:  No visual changes, runny nose, sore throat, mouth sores or tenderness. Lungs: No shortness of breath or cough.  No hemoptysis. Cardiac:  No chest pain, palpitations, orthopnea, or PND. GI:  Irritable bowel exacerbated by nerves.  No nausea, vomiting,constipation, melena or hematochezia. GU:  No urgency, frequency, dysuria, or hematuria. Musculoskeletal:  Left hip and knee problems (chronic).  Right middle finger arthritis.   No muscle tenderness. Extremities:  No pain or swelling. Skin:  No rashes or skin changes. Neuro:  No headache, numbness or weakness, balance or coordination issues. Endocrine:  No diabetes, thyroid issues, hot flashes or night sweats. Psych:  No mood changes, depression or anxiety. Pain:  Left upper arm pain (6 out of 10).  Review of systems:  All other systems reviewed and found to be negative.  Physical Exam: Blood pressure 137/71, pulse (!) 50, temperature 98.2 F (36.8 C), temperature source Tympanic, resp. rate 12, height '5\' 5"'$  (1.651 m), weight 199 lb 1.6 oz (90.3 kg), SpO2 95 %, peak flow (!) 12 L/min. GENERAL:  Elderly woman sitting comfortably in the exam room in no acute distress.  She needs assistance onto the table.  She has a walker by her side. MENTAL STATUS:  Alert and oriented to  person, place and time. HEAD:  Short gray styled hair.  Normocephalic, atraumatic, face symmetric, no Cushingoid features. EYES:  Hazel eyes.  Pupils equal round and reactive to light and accomodation.  No conjunctivitis or scleral icterus. ENT:  Oropharynx clear without lesion.  Tongue normal. Mucous membranes moist.  RESPIRATORY:  Clear to auscultation without rales, wheezes or rhonchi. CARDIOVASCULAR:  Regular rate and rhythm without murmur, rub or gallop. BREAST: Left breast s/p lumpectomy.  4 cm left axillary fullness (hematoma versus seroma). ABDOMEN:  Soft, non-tender, with active bowel sounds, and no hepatosplenomegaly.  No masses. SKIN:  Fungal rash beneath breasts bilaterally.  No ulcers or lesions. EXTREMITIES: Chronic lower extremity changes.  No skin discoloration or tenderness.  No palpable cords. LYMPH NODES: No palpable cervical, supraclavicular, axillary or inguinal adenopathy  NEUROLOGICAL: Unremarkable. PSYCH:  Appropriate.   No visits with results within 3 Day(s) from this visit.  Latest known visit with results is:  Hospital Outpatient Visit on 07/10/2017  Component Date  Value Ref Range Status  . SURGICAL PATHOLOGY 07/10/2017    Final-Edited                   Value:Surgical Pathology THIS IS AN ADDENDUM REPORT CASE: [id number] PATIENT: [facility] Surgical Pathology Report Addendum   Reason for Addendum #1:  Additional clinical/test information  SPECIMEN SUBMITTED: A. Axilla, left  CLINICAL HISTORY: Left axillary mass in a patient with history of prior ipsilateral breast cancer  PRE-OPERATIVE DIAGNOSIS: Metastatic disease vs second primary cancer  POST-OPERATIVE DIAGNOSIS: None provided.     DIAGNOSIS: A.  LEFT AXILLA; ULTRASOUND-GUIDED NEEDLE CORE BIOPSY: - INVASIVE MAMMARY CARCINOMA WITH FOCAL LOBULAR FEATURES.  Size of invasive carcinoma:  1.1 cm in this sample Histologic grade of invasive carcinoma: Grade 2      Glandular/tubular differentiation score: 3      Nuclear pleomorphism score: 2      Mitotic rate score: 1  ER/PR/HER2: Immunohistochemistry will be performed, with reflex to [place] for HER2 2+ (equivocal) results. The results will be reported in an addendum.  The                          diagnosis was called to Eino Farber of [facility] on 07/11/17 at 227 PM.   GROSS DESCRIPTION:  A. The specimen is received in a formalin-filled container labeled with the patient's name and left axillary mass.  Core pieces: multiple, 2.0 x 0.9 x 0.1 cm Comments: yellow to red lobulated fibrofatty, marked green  Entirely submitted in cassette(s): 1  Time/Date in fixative: placed in formalin at 1:50 PM on 07/10/17 cold ischemic time of less than 5 minutes Total fixation time: 6.5 hours    Final Diagnosis performed by Delorse Lek, MD.  Electronically signed 07/11/2017 2:29:14PM   The electronic signature indicates that the named Attending Pathologist has evaluated the specimen  Technical component performed at [facility], [address] [place], [place] [address] Lab: [phone] Dir: Darrick Penna. Evette Doffing,  MD  Professional component performed at [facility], [facility], [place], [place], [place] [address] Lab: [phone]                          Dir: Dellia Nims. Rubinas, MD   Breast Biomarker Reporting Template  BREAST BIOMARKER TESTS Estrogen Receptor (ER) Status: POSITIVE, >90% nuclear staining      Average intensity of staining: Strong Progesterone Receptor (PgR) Status:  POSITIVE, >90% nuclear staining      Average intensity of staining: Strong HER2 (by immunohistochemistry): EQUIVOCAL, 2+ Percentage of cells with uniform intense complete membrane staining: 0%  METHODS Cold Ischemia and Fixation Times: Meet requirements specified in latest version of the ASCO/CAP guidelines Testing Performed on Block Number(s): A1 Fixative: Formalin Estrogen Receptor:  FDA cleared ([place]entana)                    Primary Antibody:  SP1 Progesterone Receptor: FDA cleared (Ventana)                   Primary Antibody: 1E2 HER2 (by immunohistochemistry): FDA approved (DAKO)                             Primary Antibody: HercepTest  Immunohistochemistry controls worked appropriately. Slides were prepared by [facility] for [facility], [place], [place], and interpreted by Dr. Luana Shu.      Addendum #1 performed by Delorse Lek, MD.  Electronically signed 07/15/2017 1:36:03PM    Technical component performed at [facility], [address], [place], [place] [address] Lab: [phone] Dir: Darrick Penna. Evette Doffing, MD  Professional component performed at [facility], [facility], [place], [place], [place] [address] Lab: [phone] Dir: Dellia Nims. Rubinas, MD      Assessment:  Kelli Williams is a 83 y.o. female with metastatic breast cancer s/p left axillary biopsy on 07/10/2017.  Pathology revealed a 1.1 cm grade II invasive mammary carcinoma with focal lobular features.   Wide excision on 08/20/2017 revealed metastatic  mammary carcinoma involving 1 of 4 axillary lymph nodes with extracapsular extension.  The superior medial margin was involved.  Tumor was ER + (> 90%), PR + (> 90%), and Her2/neu 2+ (FISH -).   She has a history of left breast cancer s/p wide excision and sentinel lymph node biopsy on 07/18/1999 at [facility].  Pathology revealed a 0.8 x 0.6 x 0.4 cm grade II invasive ductal adenocarcinoma.  There was no lymphovascular invasion.  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  Margins were negative.  Tumor was ER + (50%) and PR + (90%).  Pathologic stage was T1bN0.  She received 6200 cGy from 08/21/1999 - 10/03/1999.  She completed 5 years of tamoxifen in 06/2004.    PET scan on 09/16/2017 revealed widespread scattered hypermetabolic lytic osseous metastases throughout the axial and proximal appendicular skeleton.  Bilateral proximal femoral skeletal metastases placed the patient at risk for pathologic hip fractures.  There was mild hypermetabolic right hilar lymphadenopathy.  Plain films of the bilateral femurs on 09/23/2017 revealed no pathologic fracture. The LEFT view showed lytic subtrochanteric metastasis that was previously seen on PET scan.   CA27.29 has been followed:  14.7 on 02/05/2017, 19.1 on 02/06/2012, 172.2 on 09/04/2017, and 144.9 on 11/06/2017.  She received a palliative course of radiation (3000 cGy in 10 fractions) to the left hip from 10/14/2017 - 10/27/2017.  She began Faslodex on 09/26/2017 (last 10/13/2017).  She began monthly Xgeva on 09/26/2017.    Bone density on 09/01/2017 revealed osteopenia with a T-score of -1.9 in the right femoral neck and left forearm.   Symptomatically, she notes chronic left hip and knee issues.  Exam reveals a post-surgical changes in the left axillae.  She has a Candidal rash under her breasts. She has a 4 cm seroma/hematoma in the LEFT axilla.  Calcium is 8.5.  Plan: 1.  Labs today:  CBC with diff, CMP, CA27.29. 2.  Discuss Faslodex. Patient is  overdue. Will proceed with injection today.  3.  Discuss Xgeva. Will hold today, as patient's calcium is low at 8.5. Patient encouraged to increase Calcium to 1800 mg daily with vitamin D 800 IU. 4.  Rx: Nystatin powder topically QID PRN (refills: 3) 5.  RTC in 1 month MD assessment, labs (CBC with diff, CMP), Faslodex and Xgeva.   Addendum:  [facility] genetic testing on 09/26/2017 revealed no pathogenic sequence variants or deletions/duplications.   Honor Loh, NP  11/06/2017, 11:44 AM   I saw and evaluated the patient, participating in the key portions of the service and reviewing pertinent diagnostic studies and records.  I reviewed the nurse practitioner's note and agree with the findings and the plan.  The assessment and plan were discussed with the patient.  Multiple questions were asked by the patient and answered.   Lequita Asal, MD 11/06/2017,11:44 AM

## 2017-11-07 LAB — CANCER ANTIGEN 27.29: CA 27.29: 144.9 U/mL — ABNORMAL HIGH (ref 0.0–38.6)

## 2017-11-11 ENCOUNTER — Other Ambulatory Visit: Payer: Self-pay | Admitting: Internal Medicine

## 2017-11-11 DIAGNOSIS — G5793 Unspecified mononeuropathy of bilateral lower limbs: Secondary | ICD-10-CM

## 2017-11-11 DIAGNOSIS — K589 Irritable bowel syndrome without diarrhea: Secondary | ICD-10-CM

## 2017-11-13 ENCOUNTER — Encounter: Payer: Self-pay | Admitting: Urgent Care

## 2017-11-13 DIAGNOSIS — H2512 Age-related nuclear cataract, left eye: Secondary | ICD-10-CM | POA: Diagnosis not present

## 2017-11-20 ENCOUNTER — Telehealth: Payer: Self-pay | Admitting: *Deleted

## 2017-11-20 NOTE — Telephone Encounter (Signed)
Advised daughter to contact PCP for follow up care for fall with PCP.

## 2017-11-20 NOTE — Telephone Encounter (Signed)
Patients daughter called to report that her mother fell yesterday afternoon. EMS called and assessed the patient. Patient was not sent to ED. Daughter would like to know if Dr. Mike Gip thinks she should have and xray?

## 2017-11-20 NOTE — Telephone Encounter (Signed)
This would need to be referred to PCP Army Melia) for further further evaluation, treatment, and the appropriate referral as needed.

## 2017-11-24 ENCOUNTER — Encounter: Payer: Self-pay | Admitting: Hematology and Oncology

## 2017-11-24 DIAGNOSIS — B379 Candidiasis, unspecified: Secondary | ICD-10-CM | POA: Insufficient documentation

## 2017-11-24 DIAGNOSIS — Z7189 Other specified counseling: Secondary | ICD-10-CM | POA: Insufficient documentation

## 2017-12-04 ENCOUNTER — Other Ambulatory Visit: Payer: Self-pay

## 2017-12-04 ENCOUNTER — Inpatient Hospital Stay (HOSPITAL_BASED_OUTPATIENT_CLINIC_OR_DEPARTMENT_OTHER): Payer: Medicare Other | Admitting: Hematology and Oncology

## 2017-12-04 ENCOUNTER — Inpatient Hospital Stay: Payer: Medicare Other

## 2017-12-04 ENCOUNTER — Inpatient Hospital Stay: Payer: Medicare Other | Attending: Hematology and Oncology

## 2017-12-04 VITALS — BP 171/71 | HR 57 | Temp 96.3°F | Resp 18 | Wt 198.9 lb

## 2017-12-04 DIAGNOSIS — K589 Irritable bowel syndrome without diarrhea: Secondary | ICD-10-CM

## 2017-12-04 DIAGNOSIS — M8580 Other specified disorders of bone density and structure, unspecified site: Secondary | ICD-10-CM | POA: Insufficient documentation

## 2017-12-04 DIAGNOSIS — I10 Essential (primary) hypertension: Secondary | ICD-10-CM | POA: Insufficient documentation

## 2017-12-04 DIAGNOSIS — Z9223 Personal history of estrogen therapy: Secondary | ICD-10-CM | POA: Insufficient documentation

## 2017-12-04 DIAGNOSIS — C773 Secondary and unspecified malignant neoplasm of axilla and upper limb lymph nodes: Secondary | ICD-10-CM | POA: Diagnosis not present

## 2017-12-04 DIAGNOSIS — Z79818 Long term (current) use of other agents affecting estrogen receptors and estrogen levels: Secondary | ICD-10-CM | POA: Diagnosis not present

## 2017-12-04 DIAGNOSIS — Z880 Allergy status to penicillin: Secondary | ICD-10-CM | POA: Diagnosis not present

## 2017-12-04 DIAGNOSIS — M1990 Unspecified osteoarthritis, unspecified site: Secondary | ICD-10-CM | POA: Diagnosis not present

## 2017-12-04 DIAGNOSIS — N6489 Other specified disorders of breast: Secondary | ICD-10-CM | POA: Diagnosis not present

## 2017-12-04 DIAGNOSIS — R52 Pain, unspecified: Secondary | ICD-10-CM | POA: Insufficient documentation

## 2017-12-04 DIAGNOSIS — Z85828 Personal history of other malignant neoplasm of skin: Secondary | ICD-10-CM

## 2017-12-04 DIAGNOSIS — F419 Anxiety disorder, unspecified: Secondary | ICD-10-CM

## 2017-12-04 DIAGNOSIS — Z853 Personal history of malignant neoplasm of breast: Secondary | ICD-10-CM

## 2017-12-04 DIAGNOSIS — C50412 Malignant neoplasm of upper-outer quadrant of left female breast: Secondary | ICD-10-CM

## 2017-12-04 DIAGNOSIS — Z90710 Acquired absence of both cervix and uterus: Secondary | ICD-10-CM

## 2017-12-04 DIAGNOSIS — K219 Gastro-esophageal reflux disease without esophagitis: Secondary | ICD-10-CM | POA: Insufficient documentation

## 2017-12-04 DIAGNOSIS — Z79899 Other long term (current) drug therapy: Secondary | ICD-10-CM | POA: Diagnosis not present

## 2017-12-04 DIAGNOSIS — R222 Localized swelling, mass and lump, trunk: Secondary | ICD-10-CM | POA: Insufficient documentation

## 2017-12-04 DIAGNOSIS — Z170 Estrogen receptor positive status [ER+]: Secondary | ICD-10-CM | POA: Diagnosis not present

## 2017-12-04 DIAGNOSIS — Z923 Personal history of irradiation: Secondary | ICD-10-CM | POA: Diagnosis not present

## 2017-12-04 DIAGNOSIS — C7951 Secondary malignant neoplasm of bone: Secondary | ICD-10-CM | POA: Diagnosis not present

## 2017-12-04 DIAGNOSIS — M85851 Other specified disorders of bone density and structure, right thigh: Secondary | ICD-10-CM

## 2017-12-04 DIAGNOSIS — Z7189 Other specified counseling: Secondary | ICD-10-CM

## 2017-12-04 LAB — CBC WITH DIFFERENTIAL/PLATELET
Basophils Absolute: 0 10*3/uL (ref 0–0.1)
Basophils Relative: 1 %
Eosinophils Absolute: 0.4 10*3/uL (ref 0–0.7)
Eosinophils Relative: 5 %
HCT: 37.5 % (ref 35.0–47.0)
Hemoglobin: 12.5 g/dL (ref 12.0–16.0)
Lymphocytes Relative: 17 %
Lymphs Abs: 1.2 10*3/uL (ref 1.0–3.6)
MCH: 29.3 pg (ref 26.0–34.0)
MCHC: 33.3 g/dL (ref 32.0–36.0)
MCV: 87.9 fL (ref 80.0–100.0)
Monocytes Absolute: 0.5 10*3/uL (ref 0.2–0.9)
Monocytes Relative: 8 %
Neutro Abs: 5.1 10*3/uL (ref 1.4–6.5)
Neutrophils Relative %: 71 %
Platelets: 238 10*3/uL (ref 150–440)
RBC: 4.26 MIL/uL (ref 3.80–5.20)
RDW: 14.6 % — ABNORMAL HIGH (ref 11.5–14.5)
WBC: 7.3 10*3/uL (ref 3.6–11.0)

## 2017-12-04 LAB — COMPREHENSIVE METABOLIC PANEL
ALT: 13 U/L — ABNORMAL LOW (ref 14–54)
AST: 22 U/L (ref 15–41)
Albumin: 4.1 g/dL (ref 3.5–5.0)
Alkaline Phosphatase: 77 U/L (ref 38–126)
Anion gap: 8 (ref 5–15)
BUN: 30 mg/dL — ABNORMAL HIGH (ref 6–20)
CO2: 27 mmol/L (ref 22–32)
Calcium: 8.9 mg/dL (ref 8.9–10.3)
Chloride: 98 mmol/L — ABNORMAL LOW (ref 101–111)
Creatinine, Ser: 1.04 mg/dL — ABNORMAL HIGH (ref 0.44–1.00)
GFR calc Af Amer: 54 mL/min — ABNORMAL LOW (ref >60)
GFR calc non Af Amer: 46 mL/min — ABNORMAL LOW (ref >60)
Glucose, Bld: 92 mg/dL (ref 65–99)
Potassium: 4.1 mmol/L (ref 3.5–5.1)
Sodium: 133 mmol/L — ABNORMAL LOW (ref 135–145)
Total Bilirubin: 0.4 mg/dL (ref 0.3–1.2)
Total Protein: 7.8 g/dL (ref 6.5–8.1)

## 2017-12-04 MED ORDER — FULVESTRANT 250 MG/5ML IM SOLN
500.0000 mg | Freq: Once | INTRAMUSCULAR | Status: AC
  Administered 2017-12-04: 500 mg via INTRAMUSCULAR
  Filled 2017-12-04: qty 10

## 2017-12-04 MED ORDER — DENOSUMAB 120 MG/1.7ML ~~LOC~~ SOLN: 120.0000 mg | Freq: Once | SUBCUTANEOUS | Status: DC

## 2017-12-04 NOTE — Progress Notes (Signed)
[facility] day:  12/04/2017  Chief Complaint: Kelli Williams is a 83 y.o. female with metastatic breast cancer who is seen for continuation of Faslodex and Xgeva.  HPI:  The patient was last seen in the medical oncology clinic on 11/06/2017.  At that time, she noted chronic left hip and knee issues.  Exam revealed post-surgical changes in the left axillae.  She had a Candidal rash under her breasts. Calcium was 8.5.  She received Faslodex.  Delton See was held.  Calcium and vitamin D were encouraged.  She was prescribed Nystatin powder.  During the interim, patient has been doing well overall. She complains of some post Faslodex soreness. She notes that she feels "bad" for the first few days following the injection. She experiences nausea. Patient denies any breast concerns. She has not experienced any B symptoms or interval infections. She is eating well, with no significant weight loss demonstrated.   Patient notes generalized pain rated 2/10 today.   Past Medical History:  Diagnosis Date  . Anxiety   . Arthritis    BOTH FEET AND KNEES  . Breast cancer (Kelli Williams) 2000   left breast ca with lumpectomy and rad tx 5 yr tamoxifen/Dr Kelli Williams at [facility]  . Complication of anesthesia    WOKE UP DURING SURGERY FOR DEVIATED SEPTUM, KNEE REPLACEMENT AND BREAST LUMPECTOMY  . Dysrhythmia   . GERD (gastroesophageal reflux disease)   . Hypertension   . Neuropathic pain of both legs   . Neuropathy   . Pneumonia 2016  . Skin cancer of forehead   . Skin cancer of nose     Past Surgical History:  Procedure Laterality Date  . ABDOMINAL HYSTERECTOMY    . AXILLARY LYMPH NODE BIOPSY Left 07/10/2017   INVASIVE MAMMARY CARCINOMA WITH FOCAL LOBULAR FEATURES.   Marland Kitchen BREAST BIOPSY Bilateral 1960   benign  . BREAST LUMPECTOMY Left 2000   breast ca with rad tx  . BREAST LUMPECTOMY Left 08/20/2017  . BREAST LUMPECTOMY Left 08/20/2017   Procedure: left breast wide excision;   Surgeon: Kelli Bellow, MD;  Location: [facility] ORS;  Service: General;  Laterality: Left;  . CATARACT EXTRACTION     left eye  . CHOLECYSTECTOMY    . JOINT REPLACEMENT    . NASAL SEPTUM SURGERY    . PARTIAL KNEE ARTHROPLASTY     right  . TOTAL VAGINAL HYSTERECTOMY      Family History  Problem Relation Age of Onset  . Heart disease Mother   . Heart attack Paternal Uncle   . Breast cancer Daughter 60       genetic negative  . Cancer Paternal Grandmother     Social History:  reports that  has never smoked. she has never used smokeless tobacco. She reports that she does not drink alcohol or use drugs.  The patient's husband died 2 years ago.  She was born outside of [place].  She previously worked as an Web designer.  She lives at the [facility] [place].  She is independent except for the use of her walker. Patient has one daughter Kelli Williams) The patient is alone today.  Allergies:  Allergies  Allergen Reactions  . Amitriptyline Hives  . Amoxicillin     Diarrhea    . Ivp Dye [Iodinated Diagnostic Agents]   . Sulfa Antibiotics Rash  . Sulfa Antibiotics Rash    Current Medications: Current Outpatient Medications  Medication Sig Dispense Refill  . aspirin  81 MG tablet Take 1 tablet by mouth daily.    . Calcium-Magnesium 500-250 MG TABS Take 1 tablet by mouth daily.     . Coenzyme Q10 (CO Q-10) 100 MG CAPS Take by mouth daily.    Marland Kitchen gabapentin (NEURONTIN) 300 MG capsule Take 3 capsules (900 mg total) by mouth 2 (two) times daily. 540 capsule 3  . lisinopril-hydrochlorothiazide (PRINZIDE,ZESTORETIC) 20-25 MG tablet TAKE 1 AND 1/2 TABLETS BY  MOUTH DAILY 135 tablet 3  . meloxicam (MOBIC) 15 MG tablet TAKE 1 TABLET BY MOUTH  DAILY 90 tablet 1  . Multiple Vitamins-Minerals (MULTIVITAMIN WITH MINERALS) tablet Take 1 tablet by mouth daily.    Marland Kitchen nystatin (MYCOSTATIN/NYSTOP) powder Apply topically 4 (four) times daily. 15 g 3  . omeprazole (PRILOSEC) 20 MG capsule  TAKE 1 CAPSULE BY MOUTH  DAILY 90 capsule 1  . acetaminophen (TYLENOL) 325 MG tablet Take 650 mg by mouth every 6 (six) hours as needed.     No current facility-administered medications for this visit.     Review of Systems:  GENERAL:  Feels "ok".  No fevers or sweats.  Weight down 1 pound.  PERFORMANCE STATUS (ECOG):  2 HEENT:  No visual changes, runny nose, sore throat, mouth sores or tenderness. Lungs: No shortness of breath or cough.  No hemoptysis. Cardiac:  No chest pain, palpitations, orthopnea, or PND. GI:  Irritable bowel exacerbated by nerves.  No nausea, vomiting,constipation, melena or hematochezia. GU:  No urgency, frequency, dysuria, or hematuria. Musculoskeletal:  Left hip and knee problems (chronic).  Right middle finger arthritis.  No muscle tenderness. Extremities:  No pain or swelling. Skin:  No rashes or skin changes. Neuro:  No headache, numbness or weakness, balance or coordination issues. Endocrine:  No diabetes, thyroid issues, hot flashes or night sweats. Psych:  No mood changes, depression or anxiety.  She "dreads her infusions". Pain:  Generalized pain (2 out of 10).  Review of systems:  All other systems reviewed and found to be negative.  Physical Exam: Blood pressure (!) 171/71, pulse (!) 57, temperature (!) 96.3 F (35.7 C), temperature source Tympanic, resp. rate 18, weight 198 lb 14.4 oz (90.2 kg). GENERAL:  Elderly woman sitting comfortably in the exam room in no acute distress.  She needs assistance onto the table.  She has a walker by her side. MENTAL STATUS:  Alert and oriented to person, place and time. HEAD:  Short gray styled hair.  Normocephalic, atraumatic, face symmetric, no Cushingoid features. EYES:  Hazel eyes.  Pupils equal round and reactive to light and accomodation.  No conjunctivitis or scleral icterus. ENT:  Oropharynx clear without lesion.  Tongue normal. Mucous membranes moist.  RESPIRATORY:  Clear to auscultation without rales,  wheezes or rhonchi. CARDIOVASCULAR:  Regular rate and rhythm without murmur, rub or gallop. ABDOMEN:  Soft, non-tender, with active bowel sounds, and no hepatosplenomegaly.  No masses. BACK:  Right side of sacrum tender. SKIN:  Fungal rash beneath breasts bilaterally.  No ulcers or lesions. EXTREMITIES: Chronic lower extremity changes.  No skin discoloration or tenderness.  No palpable cords. LYMPH NODES: 3 cm left axillary fullness (hematoma versus seroma).  No palpable cervical, supraclavicular, axillary or inguinal adenopathy  NEUROLOGICAL: Unremarkable. PSYCH:  Appropriate.   No visits with results within 3 Day(s) from this visit.  Latest known visit with results is:  Hospital Outpatient Visit on 07/10/2017  Component Date Value Ref Range Status  . SURGICAL PATHOLOGY 07/10/2017    Final-Edited  Value:Surgical Pathology THIS IS AN ADDENDUM REPORT CASE: [id number] PATIENT: [facility] Surgical Pathology Report Addendum   Reason for Addendum #1:  Additional clinical/test information  SPECIMEN SUBMITTED: A. Axilla, left  CLINICAL HISTORY: Left axillary mass in a patient with history of prior ipsilateral breast cancer  PRE-OPERATIVE DIAGNOSIS: Metastatic disease vs second primary cancer  POST-OPERATIVE DIAGNOSIS: None provided.     DIAGNOSIS: A.  LEFT AXILLA; ULTRASOUND-GUIDED NEEDLE CORE BIOPSY: - INVASIVE MAMMARY CARCINOMA WITH FOCAL LOBULAR FEATURES.  Size of invasive carcinoma:  1.1 cm in this sample Histologic grade of invasive carcinoma: Grade 2      Glandular/tubular differentiation score: 3      Nuclear pleomorphism score: 2      Mitotic rate score: 1  ER/PR/HER2: Immunohistochemistry will be performed, with reflex to [place] for HER2 2+ (equivocal) results. The results will be reported in an addendum.  The                          diagnosis was called to Eino Farber of [facility] on 07/11/17 at 227  PM.   GROSS DESCRIPTION:  A. The specimen is received in a formalin-filled container labeled with the patient's name and left axillary mass.  Core pieces: multiple, 2.0 x 0.9 x 0.1 cm Comments: yellow to red lobulated fibrofatty, marked green  Entirely submitted in cassette(s): 1  Time/Date in fixative: placed in formalin at 1:50 PM on 07/10/17 cold ischemic time of less than 5 minutes Total fixation time: 6.5 hours    Final Diagnosis performed by Delorse Lek, MD.  Electronically signed 07/11/2017 2:29:14PM   The electronic signature indicates that the named Attending Pathologist has evaluated the specimen  Technical component performed at [facility], [address] [place], [place] [address] Lab: [phone] Dir: Darrick Penna. Evette Doffing, MD  Professional component performed at [facility], [facility], [place], [place], [place] [address] Lab: [phone]                          Dir: Dellia Nims. Rubinas, MD   Breast Biomarker Reporting Template  BREAST BIOMARKER TESTS Estrogen Receptor (ER) Status: POSITIVE, >90% nuclear staining      Average intensity of staining: Strong Progesterone Receptor (PgR) Status: POSITIVE, >90% nuclear staining      Average intensity of staining: Strong HER2 (by immunohistochemistry): EQUIVOCAL, 2+ Percentage of cells with uniform intense complete membrane staining: 0%  METHODS Cold Ischemia and Fixation Times: Meet requirements specified in latest version of the ASCO/CAP guidelines Testing Performed on Block Number(s): A1 Fixative: Formalin Estrogen Receptor:  FDA cleared ([place]entana)                    Primary Antibody:  SP1 Progesterone Receptor: FDA cleared (Ventana)                   Primary Antibody: 1E2 HER2 (by immunohistochemistry): FDA approved (DAKO)                             Primary Antibody: HercepTest  Immunohistochemistry controls worked appropriately. Slides were prepared by [facility] for [facility] Biology and Pathology, [place], [place], and interpreted by Dr. Luana Shu.      Addendum #1 performed  by Delorse Lek, MD.  Electronically signed 07/15/2017 1:36:03PM    Technical component performed at [facility], [address] [place], [place] [address] Lab: [phone] Dir: Darrick Penna. Evette Doffing, MD  Professional component performed at [facility], [facility], [address], [place], [place] [address] Lab: [phone] Dir: Dellia Nims. Rubinas, MD      Assessment:  Kelli Williams is a 83 y.o. female with metastatic breast cancer s/p left axillary biopsy on 07/10/2017.  Pathology revealed a 1.1 cm grade II invasive mammary carcinoma with focal lobular features.   Wide excision on 08/20/2017 revealed metastatic mammary carcinoma involving 1 of 4 axillary lymph nodes with extracapsular extension.  The superior medial margin was involved.  Tumor was ER + (> 90%), PR + (> 90%), and Her2/neu 2+ (FISH -).   [facility] genetic testing on 09/26/2017 revealed no pathogenic sequence variants or deletions/duplications.  She has a history of left breast cancer s/p wide excision and sentinel lymph node biopsy on 07/18/1999 at [facility].  Pathology revealed a 0.8 x 0.6 x 0.4 cm grade II invasive ductal adenocarcinoma.  There was no lymphovascular invasion.  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  Margins were negative.  Tumor was ER + (50%) and PR + (90%).  Pathologic stage was T1bN0.  She received 6200 cGy from 08/21/1999 - 10/03/1999.  She completed 5 years of tamoxifen in 06/2004.    PET scan on 09/16/2017 revealed widespread scattered hypermetabolic lytic osseous metastases throughout the axial and proximal appendicular skeleton.  Bilateral proximal femoral skeletal metastases placed the patient at risk for pathologic hip fractures.  There was mild hypermetabolic right hilar lymphadenopathy.  Plain films of the bilateral femurs on 09/23/2017 revealed no pathologic fracture. The  LEFT view showed lytic subtrochanteric metastasis that was previously seen on PET scan.   CA27.29 has been followed:  14.7 on 02/05/2017, 19.1 on 02/06/2012, 172.2 on 09/04/2017, and 144.9 on 11/06/2017.  She received a palliative course of radiation (3000 cGy in 10 fractions) to the left hip from 10/14/2017 - 10/27/2017.  She began Faslodex on 09/26/2017 (last 11/06/2017).  She began monthly Xgeva on 09/26/2017.    Bone density on 09/01/2017 revealed osteopenia with a T-score of -1.9 in the right femoral neck and left forearm.   Symptomatically, she feels pretty good. She denies any acute concerns.  Exam reveals a post-surgical changes in the left axillae with a a stable seroma/hematoma in the LEFT axilla.  Calcium is 8.9  Plan: 1.  Labs today:  CBC with diff, CMP. 2.  Faslodex injection today. 3.  Discuss decreased renal function. Patient encouraged to increase fluid intake.  4.  Discuss Xgeva. Will hold today, as patient's calcium is borderline normal at 8.9. Patient encouraged to continue calcium to 1800 mg daily with vitamin D 800 IU. 5.  Discuss hypertension. Will communicate with Dr. Army Melia regarding need to adjust BP medications.  6.  RTC in 1 month MD assessment, labs (CBC with diff, CMP, CA27.29), Faslodex and Xgeva.    Honor Loh, NP  12/04/2017, 1:56 PM   I saw and evaluated the patient, participating in the key portions of the service and reviewing pertinent diagnostic studies and records.  I reviewed the nurse practitioner's note and agree with the findings and the plan.  The assessment and plan were discussed with the patient.  Multiple questions were asked by the patient and answered.   Lequita Asal, MD 12/04/2017,1:56 PM

## 2017-12-04 NOTE — Progress Notes (Signed)
Here for follow up-stated overall feeling well

## 2017-12-05 ENCOUNTER — Other Ambulatory Visit: Payer: Self-pay | Admitting: *Deleted

## 2017-12-05 DIAGNOSIS — Z853 Personal history of malignant neoplasm of breast: Secondary | ICD-10-CM

## 2017-12-07 ENCOUNTER — Encounter: Payer: Self-pay | Admitting: Hematology and Oncology

## 2017-12-10 ENCOUNTER — Other Ambulatory Visit: Payer: Self-pay

## 2017-12-10 ENCOUNTER — Encounter: Payer: Self-pay | Admitting: Radiation Oncology

## 2017-12-10 ENCOUNTER — Ambulatory Visit
Admission: RE | Admit: 2017-12-10 | Discharge: 2017-12-10 | Disposition: A | Discharge status: Home / Self Care | Payer: Medicare Other | Source: Ambulatory Visit | Attending: Radiation Oncology | Admitting: Radiation Oncology

## 2017-12-10 VITALS — BP 142/64 | Temp 96.6°F | Resp 20 | Wt 199.1 lb

## 2017-12-10 DIAGNOSIS — Z923 Personal history of irradiation: Secondary | ICD-10-CM | POA: Insufficient documentation

## 2017-12-10 DIAGNOSIS — Z170 Estrogen receptor positive status [ER+]: Secondary | ICD-10-CM | POA: Insufficient documentation

## 2017-12-10 DIAGNOSIS — C7951 Secondary malignant neoplasm of bone: Secondary | ICD-10-CM | POA: Diagnosis not present

## 2017-12-10 DIAGNOSIS — C50912 Malignant neoplasm of unspecified site of left female breast: Secondary | ICD-10-CM | POA: Diagnosis not present

## 2017-12-10 DIAGNOSIS — C778 Secondary and unspecified malignant neoplasm of lymph nodes of multiple regions: Secondary | ICD-10-CM | POA: Diagnosis not present

## 2017-12-10 NOTE — Progress Notes (Signed)
Radiation Oncology Follow up Note  Name: Kelli Williams   Date:   12/10/2017 MRN:  [id number] DOB: 1926/04/05    This 84 y.o. female presents to the clinic today for one-month follow-up status post palliative radiation therapy to her left hip for widespread metastatic presumed breast cancer.  REFERRING PROVIDER: Glean Hess, MD  HPI: Patient is a 84 year old female now 1 month out having completed palliative radiation therapy to her left hip for widespread metastatic presumed breast cancer. She is doing fairly well she states she still has pain in her left hip and again a focal pain superior to her right hip. I have reviewed her PET scan showing widespread metastatic disease including marked destruction of her thoracic lumbar spine although those areas are not giving her significant pain at this time.. She is currently on X [place] and Faslodex  COMPLICATIONS OF TREATMENT: none  FOLLOW UP COMPLIANCE: keeps appointments   PHYSICAL EXAM:  BP (!) 142/64   Temp (!) 96.6 F (35.9 C)   Resp 20   Wt 199 lb 1.2 oz (90.3 kg)   BMI 33.13 kg/m  Deep palpation of her lumbar thoracic spine does not elicit pain. Range of motion of her lower extremities does not elicit pain motor and sensory levels are equal and symmetric in the lower extremities. Well-developed well-nourished patient in NAD. HEENT reveals PERLA, EOMI, discs not visualized.  Oral cavity is clear. No oral mucosal lesions are identified. Neck is clear without evidence of cervical or supraclavicular adenopathy. Lungs are clear to A&P. Cardiac examination is essentially unremarkable with regular rate and rhythm without murmur rub or thrill. Abdomen is benign with no organomegaly or masses noted. Motor sensory and DTR levels are equal and symmetric in the upper and lower extremities. Cranial nerves II through XII are grossly intact. Proprioception is intact. No peripheral adenopathy or edema is identified. No motor or sensory levels are  noted. Crude visual fields are within normal range.  RADIOLOGY RESULTS: PET CT scan is reviewed and compatible with the above-stated findings  PLAN: At this time she will continue under the care of medical oncology. I would be happy to reevaluate patient at any time should further palliative treatment be indicated. Patient is to call with any concerns. I'm especially concerned about her spine and may need to treat that area in the near future to prevent pathologic fracture and compression fracture.  I would like to take this opportunity to thank you for allowing me to participate in the care of your patient.Noreene Filbert, MD

## 2017-12-15 ENCOUNTER — Encounter: Payer: Self-pay | Admitting: Hematology and Oncology

## 2017-12-16 ENCOUNTER — Telehealth: Payer: Self-pay | Admitting: *Deleted

## 2017-12-16 NOTE — Telephone Encounter (Signed)
Called patient's daughter, Ventura Sellers, per her request.  Ventura Sellers states her mother complains of pain at the injection site after receiving her Faslodex and not feeling well for a few days following.  Daughter is concerned and wants to know if there are other options.  She states she was told by one of her mother's friends that she had told him she wished she could stop treatment.  I explained that ultimately it is up to the patient to decide if she wants to discontinue treatment.  MD is away presently but will be back in the office Thursday.  I informed Ventura Sellers that I would let MD know the patient's continued concerns and have her speak with patient when she returns to clinic on 01-05-18 regarding treatment options.  She understands and verbalizes agreement.

## 2017-12-16 NOTE — Telephone Encounter (Signed)
-----   Message from Secundino Ginger sent at 12/12/2017 11:50 AM EST ----- Regarding: daughter called Contact: [phone] She wants to talk to someone about her mom. Kelli Williams.. Please call her.

## 2017-12-17 NOTE — Telephone Encounter (Signed)
  Please call patient's daughter back.  There are other options (see note 09/23/2017).  We can discuss this together with the patient at the next appointment or sooner if they both want to come in.  M

## 2017-12-29 ENCOUNTER — Telehealth: Payer: Self-pay | Admitting: *Deleted

## 2017-12-29 ENCOUNTER — Encounter: Payer: Medicare Other | Admitting: Nurse Practitioner

## 2017-12-29 MED ORDER — TRAMADOL HCL 50 MG PO TABS: ORAL_TABLET | ORAL | 0 refills | Status: DC

## 2017-12-29 NOTE — Telephone Encounter (Signed)
Patient having sacral pain since Thursday and Meloxicam is not helping, Asking for something to be done for her. Her next appointment is not until 1/28. Please advise

## 2017-12-29 NOTE — Telephone Encounter (Signed)
Per VO Dr Mike Gip, Tramadol 50 mg tablet 1/2 - 1 tablet every 8 hours as needed pain # 30. Patient informed of this

## 2017-12-29 NOTE — Telephone Encounter (Signed)
Left message on voice mail regarding appointment with [facility] today @ 230.  I called and spoke with patient , she does not want to come in due to transportation issues and she does not want to go on Narcotics, states she usually has problems with them. She did not know side effects=he has a lesion on her sacrum and is asking if Dr C could just order some Tramadol for her without coming in and also asks what can be done for the lesion. Please advise

## 2018-01-05 ENCOUNTER — Inpatient Hospital Stay: Payer: Medicare Other

## 2018-01-05 ENCOUNTER — Inpatient Hospital Stay: Payer: Medicare Other | Attending: Hematology and Oncology

## 2018-01-05 ENCOUNTER — Inpatient Hospital Stay (HOSPITAL_BASED_OUTPATIENT_CLINIC_OR_DEPARTMENT_OTHER): Payer: Medicare Other | Admitting: Hematology and Oncology

## 2018-01-05 VITALS — BP 168/78 | HR 80 | Temp 97.6°F | Resp 18 | Wt 202.6 lb

## 2018-01-05 DIAGNOSIS — Z170 Estrogen receptor positive status [ER+]: Secondary | ICD-10-CM

## 2018-01-05 DIAGNOSIS — C7951 Secondary malignant neoplasm of bone: Secondary | ICD-10-CM

## 2018-01-05 DIAGNOSIS — C50412 Malignant neoplasm of upper-outer quadrant of left female breast: Secondary | ICD-10-CM | POA: Insufficient documentation

## 2018-01-05 DIAGNOSIS — C773 Secondary and unspecified malignant neoplasm of axilla and upper limb lymph nodes: Secondary | ICD-10-CM | POA: Insufficient documentation

## 2018-01-05 DIAGNOSIS — Z5111 Encounter for antineoplastic chemotherapy: Secondary | ICD-10-CM | POA: Diagnosis not present

## 2018-01-05 DIAGNOSIS — M85851 Other specified disorders of bone density and structure, right thigh: Secondary | ICD-10-CM

## 2018-01-05 DIAGNOSIS — Z7189 Other specified counseling: Secondary | ICD-10-CM

## 2018-01-05 LAB — COMPREHENSIVE METABOLIC PANEL
ALT: 11 U/L — ABNORMAL LOW (ref 14–54)
AST: 19 U/L (ref 15–41)
Albumin: 3.8 g/dL (ref 3.5–5.0)
Alkaline Phosphatase: 61 U/L (ref 38–126)
Anion gap: 8 (ref 5–15)
BUN: 26 mg/dL — ABNORMAL HIGH (ref 6–20)
CO2: 30 mmol/L (ref 22–32)
Calcium: 9.1 mg/dL (ref 8.9–10.3)
Chloride: 99 mmol/L — ABNORMAL LOW (ref 101–111)
Creatinine, Ser: 0.88 mg/dL (ref 0.44–1.00)
GFR calc Af Amer: >60 mL/min (ref >60)
GFR calc non Af Amer: 56 mL/min — ABNORMAL LOW (ref >60)
Glucose, Bld: 101 mg/dL — ABNORMAL HIGH (ref 65–99)
Potassium: 4.1 mmol/L (ref 3.5–5.1)
Sodium: 137 mmol/L (ref 135–145)
Total Bilirubin: 0.5 mg/dL (ref 0.3–1.2)
Total Protein: 7.2 g/dL (ref 6.5–8.1)

## 2018-01-05 LAB — CBC WITH DIFFERENTIAL/PLATELET
Basophils Absolute: 0.1 10*3/uL (ref 0–0.1)
Basophils Relative: 1 %
Eosinophils Absolute: 0.5 10*3/uL (ref 0–0.7)
Eosinophils Relative: 7 %
HCT: 37 % (ref 35.0–47.0)
Hemoglobin: 12.3 g/dL (ref 12.0–16.0)
Lymphocytes Relative: 20 %
Lymphs Abs: 1.3 10*3/uL (ref 1.0–3.6)
MCH: 29.4 pg (ref 26.0–34.0)
MCHC: 33.3 g/dL (ref 32.0–36.0)
MCV: 88 fL (ref 80.0–100.0)
Monocytes Absolute: 0.6 10*3/uL (ref 0.2–0.9)
Monocytes Relative: 9 %
Neutro Abs: 4.2 10*3/uL (ref 1.4–6.5)
Neutrophils Relative %: 63 %
Platelets: 221 10*3/uL (ref 150–440)
RBC: 4.2 MIL/uL (ref 3.80–5.20)
RDW: 14.7 % — ABNORMAL HIGH (ref 11.5–14.5)
WBC: 6.7 10*3/uL (ref 3.6–11.0)

## 2018-01-05 MED ORDER — DENOSUMAB 120 MG/1.7ML ~~LOC~~ SOLN
120.0000 mg | Freq: Once | SUBCUTANEOUS | Status: AC
  Administered 2018-01-05: 120 mg via SUBCUTANEOUS
  Filled 2018-01-05: qty 1.7

## 2018-01-05 MED ORDER — FULVESTRANT 250 MG/5ML IM SOLN
500.0000 mg | Freq: Once | INTRAMUSCULAR | Status: AC
  Administered 2018-01-05: 500 mg via INTRAMUSCULAR
  Filled 2018-01-05: qty 10

## 2018-01-05 NOTE — Progress Notes (Signed)
Pt in today for 3 month follow up.  Having swelling in feet and legs. Denies any pain.

## 2018-01-05 NOTE — Progress Notes (Signed)
[facility] day:  01/05/2018  Chief Complaint: Kelli Williams is a 84 y.o. female with metastatic breast cancer who is seen for continuation of Faslodex and Xgeva.  HPI:  The patient was last seen in the medical oncology clinic on 12/04/2017.  At that time, she felt pretty good. She denied any acute concerns.  Exam revealed post-surgical changes in the left axillae with a stable seroma/hematoma in the LEFT axilla.  Calcium was 8.9.  She received Faslodex.  During the interim, she notes a bad cold last week.  Her ears were plugged.  She describes her treatments as "not too good".  She notes the shots are painful.  Discomfort lasts a couple of days.  She states "if it is working, it's ok".  She states a 1/2 Tramadol does not help as it gives her a headache.  She notes "not much loose stool".    Past Medical History:  Diagnosis Date  . Anxiety   . Arthritis    BOTH FEET AND KNEES  . Breast cancer ([place]) 2000   left breast ca with lumpectomy and rad tx 5 yr tamoxifen/Dr Kelli Williams at [facility]  . Complication of anesthesia    WOKE UP DURING SURGERY FOR DEVIATED SEPTUM, KNEE REPLACEMENT AND BREAST LUMPECTOMY  . Dysrhythmia   . GERD (gastroesophageal reflux disease)   . Hypertension   . Neuropathic pain of both legs   . Neuropathy   . Pneumonia 2016  . Skin cancer of forehead   . Skin cancer of nose     Past Surgical History:  Procedure Laterality Date  . ABDOMINAL HYSTERECTOMY    . AXILLARY LYMPH NODE BIOPSY Left 07/10/2017   INVASIVE MAMMARY CARCINOMA WITH FOCAL LOBULAR FEATURES.   Marland Kitchen BREAST BIOPSY Bilateral 1960   benign  . BREAST LUMPECTOMY Left 2000   breast ca with rad tx  . BREAST LUMPECTOMY Left 08/20/2017  . BREAST LUMPECTOMY Left 08/20/2017   Procedure: left breast wide excision;  Surgeon: Kelli Bellow, MD;  Location: [facility] ORS;  Service: General;  Laterality: Left;  . CATARACT EXTRACTION     left eye  . CHOLECYSTECTOMY    . JOINT  REPLACEMENT    . NASAL SEPTUM SURGERY    . PARTIAL KNEE ARTHROPLASTY     right  . TOTAL VAGINAL HYSTERECTOMY      Family History  Problem Relation Age of Onset  . Heart disease Mother   . Heart attack Paternal Uncle   . Breast cancer Daughter 27       genetic negative  . Cancer Paternal Grandmother     Social History:  reports that  has never smoked. she has never used smokeless tobacco. She reports that she does not drink alcohol or use drugs.  The patient's husband died 2 years ago.  She was born outside of [place].  She previously worked as an Web designer.  She lives at the [facility] at [place].  She is independent except for the use of her walker. Patient has one daughter Kelli Williams) The patient is alone today.  Allergies:  Allergies  Allergen Reactions  . Amitriptyline Hives  . Amoxicillin     Diarrhea    . Ivp Dye [Iodinated Diagnostic Agents]   . Sulfa Antibiotics Rash  . Sulfa Antibiotics Rash    Current Medications: Current Outpatient Medications  Medication Sig Dispense Refill  . acetaminophen (TYLENOL) 325 MG tablet Take 650 mg by mouth every 6 (six) hours  as needed.    Marland Kitchen aspirin 81 MG tablet Take 1 tablet by mouth daily.    . Calcium-Magnesium 500-250 MG TABS Take 1 tablet by mouth daily.     . Coenzyme Q10 (CO Q-10) 100 MG CAPS Take by mouth daily.    Marland Kitchen gabapentin (NEURONTIN) 300 MG capsule Take 3 capsules (900 mg total) by mouth 2 (two) times daily. 540 capsule 3  . lisinopril-hydrochlorothiazide (PRINZIDE,ZESTORETIC) 20-25 MG tablet TAKE 1 AND 1/2 TABLETS BY  MOUTH DAILY 135 tablet 3  . meloxicam (MOBIC) 15 MG tablet TAKE 1 TABLET BY MOUTH  DAILY 90 tablet 1  . Multiple Vitamins-Minerals (MULTIVITAMIN WITH MINERALS) tablet Take 1 tablet by mouth daily.    Marland Kitchen nystatin (MYCOSTATIN/NYSTOP) powder Apply topically 4 (four) times daily. 15 g 3  . omeprazole (PRILOSEC) 20 MG capsule TAKE 1 CAPSULE BY MOUTH  DAILY 90 capsule 1  . traMADol (ULTRAM)  50 MG tablet One half to one tablet every 8 hours as needed pain 30 tablet 0   No current facility-administered medications for this visit.     Review of Systems:  GENERAL:  Feels "ok".  No fevers or sweats.  Weight down 1 pound.  PERFORMANCE STATUS (ECOG):  2 HEENT:  No visual changes, runny nose, sore throat, mouth sores or tenderness. Lungs: No shortness of breath or cough.  No hemoptysis. Cardiac:  No chest pain, palpitations, orthopnea, or PND. GI:  Irritable bowel exacerbated by nerves.  No nausea, vomiting,constipation, melena or hematochezia. GU:  No urgency, frequency, dysuria, or hematuria. Musculoskeletal:  Left hip and knee problems (chronic).  Right middle finger arthritis.  No muscle tenderness. Extremities:  No pain or swelling. Skin:  No rashes or skin changes. Neuro:  No headache, numbness or weakness, balance or coordination issues. Endocrine:  No diabetes, thyroid issues, hot flashes or night sweats. Psych:  No mood changes, depression or anxiety.  She "dreads her infusions". Pain:  Generalized pain (2 out of 10).  Review of systems:  All other systems reviewed and found to be negative.  Physical Exam: Blood pressure (!) 168/78, pulse 80, temperature 97.6 F (36.4 C), temperature source Tympanic, resp. rate 18, weight 202 lb 9 oz (91.9 kg). GENERAL:  Elderly woman sitting comfortably in the exam room in no acute distress.  She needs assistance onto the table.  She has a rolling walker by her side. MENTAL STATUS:  Alert and oriented to person, place and time. HEAD:  Short gray styled hair.  Normocephalic, atraumatic, face symmetric, no Cushingoid features. EYES:  Hazel eyes.  Pupils equal round and reactive to light and accomodation.  No conjunctivitis or scleral icterus. ENT:  Oropharynx clear without lesion.  Tongue normal. Mucous membranes moist.  RESPIRATORY:  Clear to auscultation without rales, wheezes or rhonchi. CARDIOVASCULAR:  Regular rate and rhythm without  murmur, rub or gallop. ABDOMEN:  Soft, non-tender, with active bowel sounds, and no hepatosplenomegaly.  No masses. SKIN:  No rashes, ulcers or lesions. EXTREMITIES: Chronic lower extremity changes.  No skin discoloration or tenderness.  No palpable cords. LYMPH NODES: Left axillary scarring/fullness.  No palpable cervical, supraclavicular, axillary or inguinal adenopathy  NEUROLOGICAL: Unremarkable. PSYCH:  Appropriate.   No visits with results within 3 Day(s) from this visit.  Latest known visit with results is:  Hospital Outpatient Visit on 07/10/2017  Component Date Value Ref Range Status  . SURGICAL PATHOLOGY 07/10/2017    Final-Edited  Value:Surgical Pathology THIS IS AN ADDENDUM REPORT CASE: [id number] PATIENT: [facility] Surgical Pathology Report Addendum   Reason for Addendum #1:  Additional clinical/test information  SPECIMEN SUBMITTED: A. Axilla, left  CLINICAL HISTORY: Left axillary mass in a patient with history of prior ipsilateral breast cancer  PRE-OPERATIVE DIAGNOSIS: Metastatic disease vs second primary cancer  POST-OPERATIVE DIAGNOSIS: None provided.     DIAGNOSIS: A.  LEFT AXILLA; ULTRASOUND-GUIDED NEEDLE CORE BIOPSY: - INVASIVE MAMMARY CARCINOMA WITH FOCAL LOBULAR FEATURES.  Size of invasive carcinoma:  1.1 cm in this sample Histologic grade of invasive carcinoma: Grade 2      Glandular/tubular differentiation score: 3      Nuclear pleomorphism score: 2      Mitotic rate score: 1  ER/PR/HER2: Immunohistochemistry will be performed, with reflex to [employer] for HER2 2+ (equivocal) results. The results will be reported in an addendum.  The                          diagnosis was called to Eino Farber of [facility] on 07/11/17 at 227 PM.   GROSS DESCRIPTION:  A. The specimen is received in a formalin-filled container labeled with the patient's name and left axillary mass.  Core pieces: multiple,  2.0 x 0.9 x 0.1 cm Comments: yellow to red lobulated fibrofatty, marked green  Entirely submitted in cassette(s): 1  Time/Date in fixative: placed in formalin at 1:50 PM on 07/10/17 cold ischemic time of less than 5 minutes Total fixation time: 6.5 hours    Final Diagnosis performed by Delorse Lek, MD.  Electronically signed 07/11/2017 2:29:14PM   The electronic signature indicates that the named Attending Pathologist has evaluated the specimen  Technical component performed at [facility], [address] [place], [place] [address] Lab: [phone] Dir: Darrick Penna. Evette Doffing, MD  Professional component performed at [facility], [facility], [place], [place], [place] [address] Lab: [phone]                          Dir: Dellia Nims. Rubinas, MD   Breast Biomarker Reporting Template  BREAST BIOMARKER TESTS Estrogen Receptor (ER) Status: POSITIVE, >90% nuclear staining      Average intensity of staining: Strong Progesterone Receptor (PgR) Status: POSITIVE, >90% nuclear staining      Average intensity of staining: Strong HER2 (by immunohistochemistry): EQUIVOCAL, 2+ Percentage of cells with uniform intense complete membrane staining: 0%  METHODS Cold Ischemia and Fixation Times: Meet requirements specified in latest version of the ASCO/CAP guidelines Testing Performed on Block Number(s): A1 Fixative: Formalin Estrogen Receptor:  FDA cleared ([facility]entana)                    Primary Antibody:  SP1 Progesterone Receptor: FDA cleared (Ventana)                   Primary Antibody: 1E2 HER2 (by immunohistochemistry): FDA approved (DAKO)                             Primary Antibody: HercepTest  Immunohistochemistry controls worked appropriately. Slides were prepared by [facility] for [facility], [place], [place], and interpreted by Dr. Luana Shu.      Addendum #1 performed by  Delorse Lek, MD.  Electronically signed  07/15/2017 1:36:03PM    Technical component performed at [facility], [address] [place], [place] [address] Lab: [phone] Dir: Darrick Penna. Evette Doffing, MD  Professional component performed at [facility], [facility], [place], [place], [place] [address] Lab: [phone] Dir: Dellia Nims. Rubinas, MD      Assessment:  Kelli Williams is a 84 y.o. female with metastatic breast cancer s/p left axillary biopsy on 07/10/2017.  Pathology revealed a 1.1 cm grade II invasive mammary carcinoma with focal lobular features.   Wide excision on 08/20/2017 revealed metastatic mammary carcinoma involving 1 of 4 axillary lymph nodes with extracapsular extension.  The superior medial margin was involved.  Tumor was ER + (> 90%), PR + (> 90%), and Her2/neu 2+ (FISH -).   [facility] genetic testing on 09/26/2017 revealed no pathogenic sequence variants or deletions/duplications.  She has a history of left breast cancer s/p wide excision and sentinel lymph node biopsy on 07/18/1999 at [facility].  Pathology revealed a 0.8 x 0.6 x 0.4 cm grade II invasive ductal adenocarcinoma.  There was no lymphovascular invasion.  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  Margins were negative.  Tumor was ER + (50%) and PR + (90%).  Pathologic stage was T1bN0.  She received 6200 cGy from 08/21/1999 - 10/03/1999.  She completed 5 years of tamoxifen in 06/2004.    PET scan on 09/16/2017 revealed widespread scattered hypermetabolic lytic osseous metastases throughout the axial and proximal appendicular skeleton.  Bilateral proximal femoral skeletal metastases placed the patient at risk for pathologic hip fractures.  There was mild hypermetabolic right hilar lymphadenopathy.  Plain films of the bilateral femurs on 09/23/2017 revealed no pathologic fracture. The LEFT view showed lytic subtrochanteric metastasis that was previously seen on PET scan.   CA27.29 has been followed:  14.7 on 02/05/2017, 19.1 on  02/06/2012, 172.2 on 09/04/2017, and 144.9 on 11/06/2017.  She received a palliative course of radiation (3000 cGy in 10 fractions) to the left hip from 10/14/2017 - 10/27/2017.  She began Faslodex on 09/26/2017 (last 12/04/2017).  She began monthly Xgeva on 09/26/2017.    Bone density on 09/01/2017 revealed osteopenia with a T-score of -1.9 in the right femoral neck and left forearm. She is on calcium and vitamin D.  Symptomatically, she feels pretty good.  Exam reveals a post-surgical changes in the left axillae.  Calcium is 9.1  Plan: 1.  Labs today:  CBC with diff, CMP, CA27.29. 2.  Discuss patient's thoughts about therapy.  She wishes to proceed if it is working.  Discuss other options (AI +/- CDK4/6 inhibitors).  Potential side effects reviewed. 3.  Faslodex injection today. 4.  Xgeva today. 5.  Discuss supplemental calcium and vitamin D. 6.  RTC in 1 month for MD assessment, labs (CBC with diff, CMP, TSH, CA27.29), Faslodex, and Xgeva.   Lequita Asal, MD  01/05/2018, 4:35 PM

## 2018-01-06 LAB — VITAMIN D 25 HYDROXY (VIT D DEFICIENCY, FRACTURES): Vit D, 25-Hydroxy: 44.7 ng/mL (ref 30.0–100.0)

## 2018-01-11 ENCOUNTER — Encounter: Payer: Self-pay | Admitting: Hematology and Oncology

## 2018-01-23 ENCOUNTER — Encounter: Payer: Self-pay | Admitting: Hematology and Oncology

## 2018-01-26 ENCOUNTER — Telehealth: Payer: Self-pay | Admitting: *Deleted

## 2018-01-26 NOTE — Telephone Encounter (Signed)
I attempted to call Corky Mull, but got voice mail. I left message that Dr Mike Gip tried to call

## 2018-01-26 NOTE — Telephone Encounter (Signed)
Ventura Sellers called to report that patient is having pain in her bones esp her lower spine and also that the patient is refusing aide to help her bathe. She asks that some one here call her to discuss accepting aide. Tramadol is not controlling her pain and she also states she does not want to take anything stronger as she is unsteady of gait. Asking for home health nurse to be ordered to check her medications etc Asking what can be done about her pain Please advise.

## 2018-01-26 NOTE — Telephone Encounter (Signed)
  I have called her phone number 3 times.  Someone answers the phone but doesn't speak then hangs up.  M

## 2018-02-04 ENCOUNTER — Other Ambulatory Visit: Payer: Self-pay | Admitting: *Deleted

## 2018-02-04 DIAGNOSIS — Z853 Personal history of malignant neoplasm of breast: Secondary | ICD-10-CM

## 2018-02-04 NOTE — Progress Notes (Signed)
[facility] day:  02/05/2018  Chief Complaint: Kelli Williams is a 84 y.o. female with metastatic breast cancer who is seen for a 1 month assessment and continuation of Faslodex and Xgeva.  HPI:  The patient was last seen in the medical oncology clinic on 01/05/2018.  At that time, patient was complaining of cold symptoms.  With regards to her treatments, patient noted that they were not going too well citing the fact that the injections were painful.  Patient stated "if it is working then it is okay".  Patient taking Tramadol 25 mg, however notes that it is not working and it gives her a headache.  Patient reported infrequent loose stools.  Exam was stable.  WBC was 6700 with an [place] of 4200.  Hemoglobin 12.3, hematocrit 37.0, and platelets 221,000.  Patient had requested that her vitamin D level be checked.  Vitamin D normal at 44.7 (30.0-100.0).  Patient received Faslodex and Xgeva injections.  Patient sent a my chart communication on 01/23/2018 with concerns regarding BRCA testing that was done back in October 2018.  She noted that she was unaware of this test and the results had not been reviewed with her.  Additional copy of this testing will be provided to the patient today for review.  Patient's daughter called the clinic on 01/26/2018 to report patient complaining of pain in her lower spine.  Daughter also has concerns with her mother not allowing her home health aide to assist her with ADLs.  Patient currently on Tramadol, however it is not controlling her pain.  Of note, the patient does not want to take anything stronger due to concerns with her unsteady gait.  Triage nurse, as well as primary oncologist, attempted to return a call to the patient to discuss concerns.  Someone at the patient's home would answer the phone however not speak and eventually hung up.  Return call to patient's daughter made by the triage nurse, and a message was left on her  voicemail advising that Dr. Mike Gip had attempted to call without success.  In the interim, she notes chronic knee, hip and back pain rated 7 out of 10.  Pain is worse in the lower back (right > left side).  She doesn't want to take anything stronger for her pain.  She states that she is discouraged as she is gaining weight.  She is due for her regular follow-up with her dentist.   Past Medical History:  Diagnosis Date  . Anxiety   . Arthritis    BOTH FEET AND KNEES  . Breast cancer ([place]) 2000   left breast ca with lumpectomy and rad tx 5 yr tamoxifen/Dr Jeannine Kitten at [facility]  . Complication of anesthesia    WOKE UP DURING SURGERY FOR DEVIATED SEPTUM, KNEE REPLACEMENT AND BREAST LUMPECTOMY  . Dysrhythmia   . GERD (gastroesophageal reflux disease)   . Hypertension   . Neuropathic pain of both legs   . Neuropathy   . Pneumonia 2016  . Skin cancer of forehead   . Skin cancer of nose     Past Surgical History:  Procedure Laterality Date  . ABDOMINAL HYSTERECTOMY    . AXILLARY LYMPH NODE BIOPSY Left 07/10/2017   INVASIVE MAMMARY CARCINOMA WITH FOCAL LOBULAR FEATURES.   Marland Kitchen BREAST BIOPSY Bilateral 1960   benign  . BREAST LUMPECTOMY Left 2000   breast ca with rad tx  . BREAST LUMPECTOMY Left 08/20/2017  . BREAST LUMPECTOMY Left 08/20/2017  Procedure: left breast wide excision;  Surgeon: Robert Bellow, MD;  Location: [facility] ORS;  Service: General;  Laterality: Left;  . CATARACT EXTRACTION     left eye  . CHOLECYSTECTOMY    . JOINT REPLACEMENT    . NASAL SEPTUM SURGERY    . PARTIAL KNEE ARTHROPLASTY     right  . TOTAL VAGINAL HYSTERECTOMY      Family History  Problem Relation Age of Onset  . Heart disease Mother   . Heart attack Paternal Uncle   . Breast cancer Daughter 7       genetic negative  . Cancer Paternal Grandmother     Social History:  reports that  has never smoked. she has never used smokeless tobacco. She reports that she does not drink alcohol or use drugs.  The  patient's husband died 2 years ago.  She was born outside of [place].  She previously worked as an Web designer.  She lives at the [facility] [place].  She is independent except for the use of her walker. Patient has one daughter Kelli Williams) The patient is alone today.  Allergies:  Allergies  Allergen Reactions  . Amitriptyline Hives  . Amoxicillin     Diarrhea    . Ivp Dye [Iodinated Diagnostic Agents]   . Sulfa Antibiotics Rash  . Sulfa Antibiotics Rash    Current Medications: Current Outpatient Medications  Medication Sig Dispense Refill  . acetaminophen (TYLENOL) 325 MG tablet Take 650 mg by mouth every 6 (six) hours as needed.    Marland Kitchen aspirin 81 MG tablet Take 1 tablet by mouth daily.    . Calcium-Magnesium 500-250 MG TABS Take 1 tablet by mouth daily.     . Coenzyme Q10 (CO Q-10) 100 MG CAPS Take by mouth daily.    Marland Kitchen gabapentin (NEURONTIN) 300 MG capsule Take 3 capsules (900 mg total) by mouth 2 (two) times daily. 540 capsule 3  . lisinopril-hydrochlorothiazide (PRINZIDE,ZESTORETIC) 20-25 MG tablet TAKE 1 AND 1/2 TABLETS BY  MOUTH DAILY 135 tablet 3  . meloxicam (MOBIC) 15 MG tablet TAKE 1 TABLET BY MOUTH  DAILY 90 tablet 1  . Multiple Vitamins-Minerals (MULTIVITAMIN WITH MINERALS) tablet Take 1 tablet by mouth daily.    Marland Kitchen nystatin (MYCOSTATIN/NYSTOP) powder Apply topically 4 (four) times daily. 15 g 3  . omeprazole (PRILOSEC) 20 MG capsule TAKE 1 CAPSULE BY MOUTH  DAILY 90 capsule 1  . traMADol (ULTRAM) 50 MG tablet One half to one tablet every 8 hours as needed pain 30 tablet 0  . propranolol (INDERAL) 10 MG tablet Take 10 mg by mouth 3 (three) times daily.     No current facility-administered medications for this visit.     Review of Systems:  GENERAL:  Feels "the same".  No fevers or sweats.  Weight up 3 pounds.  PERFORMANCE STATUS (ECOG):  2 HEENT:  No visual changes, runny nose, sore throat, mouth sores or tenderness. Lungs: No shortness of breath or  cough.  No hemoptysis. Cardiac:  No chest pain, palpitations, orthopnea, or PND. GI:  Irritable bowel.  No nausea, vomiting,constipation, melena or hematochezia. GU:  No urgency, frequency, dysuria, or hematuria. Musculoskeletal:  Left hip and knee problems (chronic).  Back pain (see HPI).  Right middle finger arthritis.  No muscle tenderness. Extremities:  No pain or swelling. Skin:  No rashes or skin changes. Neuro:  No headache, numbness or weakness, balance or coordination issues. Endocrine:  No diabetes, thyroid issues, hot flashes or night sweats.  Psych:  No mood changes, depression or anxiety.  She "dreads her infusions". Pain:  Back pain (7 out of 10).  Review of systems:  All other systems reviewed and found to be negative.  Physical Exam: Blood pressure (!) 160/75, pulse 62, temperature 98.8 F (37.1 C), temperature source Tympanic, weight 205 lb (93 kg). GENERAL:  Elderly woman sitting comfortably in the exam room in no acute distress.  She needs assistance onto the table.  She has a rolling walker by her side. MENTAL STATUS:  Alert and oriented to person, place and time. HEAD:  Short gray styled hair.  Normocephalic, atraumatic, face symmetric, no Cushingoid features. EYES:  Hazel eyes.  Pupils equal round and reactive to light and accomodation.  No conjunctivitis or scleral icterus. ENT:  Oropharynx clear without lesion.  Tongue normal. Mucous membranes moist.  RESPIRATORY:  Clear to auscultation without rales, wheezes or rhonchi. CARDIOVASCULAR:  Regular rate and rhythm without murmur, rub or gallop. BREAST:  Right breast without masses, skin changes or nipple discharge.  Left breast s/p lumpectomy.  No discrete masses, skin changes or nipple discharge.  ABDOMEN:  Soft, non-tender, with active bowel sounds, and no hepatosplenomegaly.  No masses. BACK:  Pain in lower lumbar spine on palpation. SKIN:  No rashes, ulcers or lesions. EXTREMITIES: Chronic lower extremity changes.   No skin discoloration or tenderness.  No palpable cords. LYMPH NODES: Left axillary scarring/fullness.  No palpable cervical, supraclavicular, axillary or inguinal adenopathy  NEUROLOGICAL: Unremarkable. PSYCH:  Appropriate.   No visits with results within 3 Day(s) from this visit.  Latest known visit with results is:  Hospital Outpatient Visit on 07/10/2017  Component Date Value Ref Range Status  . SURGICAL PATHOLOGY 07/10/2017    Final-Edited                   Value:Surgical Pathology THIS IS AN ADDENDUM REPORT CASE: [id number] PATIENT: [facility] Surgical Pathology Report Addendum   Reason for Addendum #1:  Additional clinical/test information  SPECIMEN SUBMITTED: A. Axilla, left  CLINICAL HISTORY: Left axillary mass in a patient with history of prior ipsilateral breast cancer  PRE-OPERATIVE DIAGNOSIS: Metastatic disease vs second primary cancer  POST-OPERATIVE DIAGNOSIS: None provided.     DIAGNOSIS: A.  LEFT AXILLA; ULTRASOUND-GUIDED NEEDLE CORE BIOPSY: - INVASIVE MAMMARY CARCINOMA WITH FOCAL LOBULAR FEATURES.  Size of invasive carcinoma:  1.1 cm in this sample Histologic grade of invasive carcinoma: Grade 2      Glandular/tubular differentiation score: 3      Nuclear pleomorphism score: 2      Mitotic rate score: 1  ER/PR/HER2: Immunohistochemistry will be performed, with reflex to [place] for HER2 2+ (equivocal) results. The results will be reported in an addendum.  The                          diagnosis was called to Eino Farber of [facility] on 07/11/17 at 227 PM.   GROSS DESCRIPTION:  A. The specimen is received in a formalin-filled container labeled with the patient's name and left axillary mass.  Core pieces: multiple, 2.0 x 0.9 x 0.1 cm Comments: yellow to red lobulated fibrofatty, marked green  Entirely submitted in cassette(s): 1  Time/Date in fixative: placed in formalin at 1:50 PM on 07/10/17 cold ischemic time  of less than 5 minutes Total fixation time: 6.5 hours    Final Diagnosis performed by Delorse Lek, MD.  Electronically signed 07/11/2017 2:29:14PM  The electronic signature indicates that the named Attending Pathologist has evaluated the specimen  Technical component performed at [facility], [address], [place], [place] [address] Lab: [phone] Dir: Darrick Penna. Evette Doffing, MD  Professional component performed at [facility], [facility], [place], [place], [place] [address] Lab: [phone]                          Dir: Dellia Nims. Rubinas, MD   Breast Biomarker Reporting Template  BREAST BIOMARKER TESTS Estrogen Receptor (ER) Status: POSITIVE, >90% nuclear staining      Average intensity of staining: Strong Progesterone Receptor (PgR) Status: POSITIVE, >90% nuclear staining      Average intensity of staining: Strong HER2 (by immunohistochemistry): EQUIVOCAL, 2+ Percentage of cells with uniform intense complete membrane staining: 0%  METHODS Cold Ischemia and Fixation Times: Meet requirements specified in latest version of the ASCO/CAP guidelines Testing Performed on Block Number(s): A1 Fixative: Formalin Estrogen Receptor:  FDA cleared ([place])                    Primary Antibody:  SP1 Progesterone Receptor: FDA cleared (Ventana)                   Primary Antibody: 1E2 HER2 (by immunohistochemistry): FDA approved (DAKO)                             Primary Antibody: HercepTest  Immunohistochemistry controls worked appropriately. Slides were prepared by [facility] for [facility], [place], [place], and interpreted by Dr. Luana Shu.      Addendum #1 performed by Delorse Lek, MD.  Electronically signed 07/15/2017 1:36:03PM    Technical component performed at [facility], [address] [place], [place] [address] Lab: [phone] Dir: Darrick Penna. Evette Doffing, MD  Professional component performed at [facility], [facility], [place], [place],  [address] Lab: [phone] Dir: Dellia Nims. Rubinas, MD      Assessment:  Kelli Williams is a 84 y.o. female with metastatic breast cancer s/p left axillary biopsy on 07/10/2017.  Pathology revealed a 1.1 cm grade II invasive mammary carcinoma with focal lobular features.   Wide excision on 08/20/2017 revealed metastatic mammary carcinoma involving 1 of 4 axillary lymph nodes with extracapsular extension.  The superior medial margin was involved.  Tumor was ER + (> 90%), PR + (> 90%), and Her2/neu 2+ (FISH -).   [facility] genetic testing on 09/26/2017 revealed no pathogenic sequence variants or deletions/duplications.  BRCA1/2 testing on 09/26/2017 was negative.  She has a history of left breast cancer s/p wide excision and sentinel lymph node biopsy on 07/18/1999 at [facility].  Pathology revealed a 0.8 x 0.6 x 0.4 cm grade II invasive ductal adenocarcinoma.  There was no lymphovascular invasion.  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  Margins were negative.  Tumor was ER + (50%) and PR + (90%).  Pathologic stage was T1bN0.  She received 6200 cGy from 08/21/1999 - 10/03/1999.  She completed 5 years of tamoxifen in 06/2004.    PET scan on 09/16/2017 revealed widespread scattered hypermetabolic lytic osseous metastases throughout the axial and proximal appendicular skeleton.  Bilateral proximal femoral skeletal metastases placed the patient at risk for pathologic hip fractures.  There  was mild hypermetabolic right hilar lymphadenopathy.  Plain films of the bilateral femurs on 09/23/2017 revealed no pathologic fracture. The LEFT view showed lytic subtrochanteric metastasis that was previously seen on PET scan.   CA27.29 has been followed:  14.7 on 02/05/2017, 19.1 on 02/06/2012, 172.2 on 09/04/2017, 144.9 on 11/06/2017, and 73.4 on 02/05/2018.  She received a palliative course of radiation (3000 cGy in 10 fractions) to the left hip from  10/14/2017 - 10/27/2017.  She began Faslodex on 09/26/2017 (last 01/05/2017).  She began monthly Xgeva on 09/26/2017 (last 01/05/2018).    Bone density on 09/01/2017 revealed osteopenia with a T-score of -1.9 in the right femoral neck and left forearm. She is on calcium and vitamin D.  Symptomatically, she has ongoing back pain (7 out of 10).  She has gained weight.  Exam reveals lumbar pain on palpation.  Plan: 1.  Labs today:  CBC with diff, CMP, CA27.29, TSH. 2.  Provide patient with a copy of BRCA testing for her personal review and records. 3.  Faslodex injection today. 4.  Xgeva injection today. 5.  Continue supplemental calcium 1200 mg and vitamin D 800 IU daily. 6.  Discuss patient's thought about therapy.  She wishes to continue Faslodex and Xgeva.  She is interested in radiation to her lower back based on imaging. 7.  Lumbar sacral MRI 8.  RTC in 1 month for MD assessment, labs (CBC with diff, CMP, CA27.29), review of imaging, Faslodex, and Xgeva.   Lequita Asal, MD  02/05/2018, 4:35 PM

## 2018-02-05 ENCOUNTER — Inpatient Hospital Stay: Payer: Medicare Other

## 2018-02-05 ENCOUNTER — Inpatient Hospital Stay: Payer: Medicare Other | Attending: Hematology and Oncology | Admitting: Hematology and Oncology

## 2018-02-05 ENCOUNTER — Encounter: Payer: Self-pay | Admitting: Hematology and Oncology

## 2018-02-05 VITALS — BP 160/75 | HR 62 | Temp 98.8°F | Wt 205.0 lb

## 2018-02-05 DIAGNOSIS — Z5111 Encounter for antineoplastic chemotherapy: Secondary | ICD-10-CM | POA: Insufficient documentation

## 2018-02-05 DIAGNOSIS — M8580 Other specified disorders of bone density and structure, unspecified site: Secondary | ICD-10-CM

## 2018-02-05 DIAGNOSIS — M25559 Pain in unspecified hip: Secondary | ICD-10-CM

## 2018-02-05 DIAGNOSIS — M545 Low back pain, unspecified: Secondary | ICD-10-CM

## 2018-02-05 DIAGNOSIS — M25569 Pain in unspecified knee: Secondary | ICD-10-CM | POA: Diagnosis not present

## 2018-02-05 DIAGNOSIS — G8929 Other chronic pain: Secondary | ICD-10-CM

## 2018-02-05 DIAGNOSIS — Z79899 Other long term (current) drug therapy: Secondary | ICD-10-CM | POA: Insufficient documentation

## 2018-02-05 DIAGNOSIS — Z853 Personal history of malignant neoplasm of breast: Secondary | ICD-10-CM

## 2018-02-05 DIAGNOSIS — Z170 Estrogen receptor positive status [ER+]: Secondary | ICD-10-CM | POA: Diagnosis not present

## 2018-02-05 DIAGNOSIS — C50412 Malignant neoplasm of upper-outer quadrant of left female breast: Secondary | ICD-10-CM

## 2018-02-05 DIAGNOSIS — Z7189 Other specified counseling: Secondary | ICD-10-CM

## 2018-02-05 DIAGNOSIS — C7951 Secondary malignant neoplasm of bone: Secondary | ICD-10-CM

## 2018-02-05 DIAGNOSIS — C773 Secondary and unspecified malignant neoplasm of axilla and upper limb lymph nodes: Secondary | ICD-10-CM

## 2018-02-05 DIAGNOSIS — G893 Neoplasm related pain (acute) (chronic): Secondary | ICD-10-CM | POA: Insufficient documentation

## 2018-02-05 DIAGNOSIS — M85851 Other specified disorders of bone density and structure, right thigh: Secondary | ICD-10-CM

## 2018-02-05 LAB — CBC WITH DIFFERENTIAL/PLATELET
Basophils Absolute: 0.1 10*3/uL (ref 0–0.1)
Basophils Relative: 1 %
Eosinophils Absolute: 0.6 10*3/uL (ref 0–0.7)
Eosinophils Relative: 9 %
HCT: 36.4 % (ref 35.0–47.0)
Hemoglobin: 12.4 g/dL (ref 12.0–16.0)
Lymphocytes Relative: 19 %
Lymphs Abs: 1.2 10*3/uL (ref 1.0–3.6)
MCH: 29.5 pg (ref 26.0–34.0)
MCHC: 34.1 g/dL (ref 32.0–36.0)
MCV: 86.5 fL (ref 80.0–100.0)
Monocytes Absolute: 0.6 10*3/uL (ref 0.2–0.9)
Monocytes Relative: 9 %
Neutro Abs: 4.1 10*3/uL (ref 1.4–6.5)
Neutrophils Relative %: 62 %
Platelets: 217 10*3/uL (ref 150–440)
RBC: 4.21 MIL/uL (ref 3.80–5.20)
RDW: 14.7 % — ABNORMAL HIGH (ref 11.5–14.5)
WBC: 6.5 10*3/uL (ref 3.6–11.0)

## 2018-02-05 LAB — COMPREHENSIVE METABOLIC PANEL
ALT: 10 U/L — ABNORMAL LOW (ref 14–54)
AST: 22 U/L (ref 15–41)
Albumin: 3.8 g/dL (ref 3.5–5.0)
Alkaline Phosphatase: 58 U/L (ref 38–126)
Anion gap: 8 (ref 5–15)
BUN: 25 mg/dL — ABNORMAL HIGH (ref 6–20)
CO2: 25 mmol/L (ref 22–32)
Calcium: 9.1 mg/dL (ref 8.9–10.3)
Chloride: 103 mmol/L (ref 101–111)
Creatinine, Ser: 0.83 mg/dL (ref 0.44–1.00)
GFR calc Af Amer: >60 mL/min (ref >60)
GFR calc non Af Amer: >60 mL/min (ref >60)
Glucose, Bld: 106 mg/dL — ABNORMAL HIGH (ref 65–99)
Potassium: 3.8 mmol/L (ref 3.5–5.1)
Sodium: 136 mmol/L (ref 135–145)
Total Bilirubin: 0.5 mg/dL (ref 0.3–1.2)
Total Protein: 7.4 g/dL (ref 6.5–8.1)

## 2018-02-05 LAB — TSH: TSH: 2.321 u[IU]/mL (ref 0.350–4.500)

## 2018-02-05 MED ORDER — FULVESTRANT 250 MG/5ML IM SOLN
500.0000 mg | Freq: Once | INTRAMUSCULAR | Status: AC
  Administered 2018-02-05: 500 mg via INTRAMUSCULAR
  Filled 2018-02-05: qty 10

## 2018-02-05 MED ORDER — DENOSUMAB 120 MG/1.7ML ~~LOC~~ SOLN
120.0000 mg | Freq: Once | SUBCUTANEOUS | Status: AC
  Administered 2018-02-05: 120 mg via SUBCUTANEOUS
  Filled 2018-02-05: qty 1.7

## 2018-02-05 NOTE — Progress Notes (Signed)
Patient complains of pain in hips and knees.  Also has bilateral, lower extremity edema.  States when she sits at home she elevated her feet.

## 2018-02-06 ENCOUNTER — Telehealth: Payer: Self-pay | Admitting: *Deleted

## 2018-02-06 LAB — VITAMIN D 25 HYDROXY (VIT D DEFICIENCY, FRACTURES): Vit D, 25-Hydroxy: 55.9 ng/mL (ref 30.0–100.0)

## 2018-02-06 LAB — CA 27.29 (SERIAL MONITOR): CA 27.29: 73.4 U/mL — ABNORMAL HIGH (ref 0.0–38.6)

## 2018-02-06 NOTE — Telephone Encounter (Signed)
Called patient to inform her that her tumor marker has decreased.  Patient appreciative of call.

## 2018-02-06 NOTE — Telephone Encounter (Signed)
-----   Message from Lequita Asal, MD sent at 02/06/2018 12:02 PM EST ----- Regarding: Please call patient  CA27.29 decreasing!  M  ----- Message ----- From: Interface, Lab In [place] Sent: 02/05/2018  10:13 AM To: Lequita Asal, MD

## 2018-02-11 ENCOUNTER — Other Ambulatory Visit: Payer: Self-pay | Admitting: Hematology and Oncology

## 2018-02-11 ENCOUNTER — Ambulatory Visit
Admission: RE | Admit: 2018-02-11 | Discharge: 2018-02-11 | Disposition: A | Discharge status: Home / Self Care | Payer: Medicare Other | Source: Ambulatory Visit | Attending: Hematology and Oncology | Admitting: Hematology and Oncology

## 2018-02-11 DIAGNOSIS — C7951 Secondary malignant neoplasm of bone: Secondary | ICD-10-CM

## 2018-02-11 DIAGNOSIS — G8929 Other chronic pain: Secondary | ICD-10-CM

## 2018-02-11 DIAGNOSIS — M899 Disorder of bone, unspecified: Secondary | ICD-10-CM | POA: Insufficient documentation

## 2018-02-11 DIAGNOSIS — M545 Low back pain, unspecified: Secondary | ICD-10-CM

## 2018-02-11 DIAGNOSIS — M48061 Spinal stenosis, lumbar region without neurogenic claudication: Secondary | ICD-10-CM | POA: Insufficient documentation

## 2018-02-11 DIAGNOSIS — C50919 Malignant neoplasm of unspecified site of unspecified female breast: Secondary | ICD-10-CM | POA: Diagnosis not present

## 2018-02-11 DIAGNOSIS — M5126 Other intervertebral disc displacement, lumbar region: Secondary | ICD-10-CM | POA: Diagnosis not present

## 2018-02-12 ENCOUNTER — Telehealth: Payer: Self-pay | Admitting: Hematology and Oncology

## 2018-02-12 ENCOUNTER — Other Ambulatory Visit: Payer: Self-pay | Admitting: Hematology and Oncology

## 2018-02-12 NOTE — Telephone Encounter (Signed)
  Re:  MRI lumbar spine  MRI results reviewed with patient.  Discussed consideration of radiation to area of pain.  Discussed with Dr. Baruch Gouty.   Lequita Asal, MD

## 2018-02-17 ENCOUNTER — Ambulatory Visit
Admission: RE | Admit: 2018-02-17 | Discharge: 2018-02-17 | Disposition: A | Discharge status: Home / Self Care | Payer: Medicare Other | Source: Ambulatory Visit | Attending: Radiation Oncology | Admitting: Radiation Oncology

## 2018-02-17 ENCOUNTER — Telehealth: Payer: Self-pay | Admitting: *Deleted

## 2018-02-17 ENCOUNTER — Other Ambulatory Visit: Payer: Self-pay

## 2018-02-17 ENCOUNTER — Encounter: Payer: Self-pay | Admitting: Radiation Oncology

## 2018-02-17 ENCOUNTER — Inpatient Hospital Stay: Payer: Medicare Other | Attending: Hematology and Oncology

## 2018-02-17 VITALS — BP 156/78 | Temp 97.6°F | Resp 18 | Wt 204.0 lb

## 2018-02-17 DIAGNOSIS — Z170 Estrogen receptor positive status [ER+]: Secondary | ICD-10-CM | POA: Diagnosis not present

## 2018-02-17 DIAGNOSIS — Z853 Personal history of malignant neoplasm of breast: Secondary | ICD-10-CM | POA: Insufficient documentation

## 2018-02-17 DIAGNOSIS — C7951 Secondary malignant neoplasm of bone: Secondary | ICD-10-CM

## 2018-02-17 DIAGNOSIS — G893 Neoplasm related pain (acute) (chronic): Secondary | ICD-10-CM | POA: Diagnosis not present

## 2018-02-17 DIAGNOSIS — C50412 Malignant neoplasm of upper-outer quadrant of left female breast: Secondary | ICD-10-CM | POA: Insufficient documentation

## 2018-02-17 DIAGNOSIS — Z5111 Encounter for antineoplastic chemotherapy: Secondary | ICD-10-CM | POA: Insufficient documentation

## 2018-02-17 NOTE — Progress Notes (Signed)
Radiation Oncology Follow up Note (OPNA) old patient new area bone metastases  Name: Kelli Williams DOBRATZ   Date:   02/17/2018 MRN:  [id number] DOB: [birth year]    This 84 y.o. female presents to the clinic today for reevaluation of progressive pain in her lower back secondary to stage IV breast cancer.  REFERRING PROVIDER: Glean Hess, MD  HPI: Patient is a 84 year old female previously treated back in October 2018 to her left hip in patient with known widespread metastatic disease from  breast cancer.. She is currently on Faslodex and X [place]. She's recently been complaining of increasing lower back pain although the area of the left hip we treated palliatively in the past has responded well. She is currently on tramadol for pain. Her previous PET CT scan back in October 2018 showed widespread hypermetabolic lytic [place] metastasis throughout the axilla and proximal appendicular skeleton. She recently had an MRI scan of her lumbar spine showing widespread signal abnormalities consistent with metastatic disease with the largest lesion at L1. No pathologic compression fracture was seen. By my review there is also involvement of the sacrum. She seen today for consideration of further palliative treatment. She is ambulating with the assistance of a walker.  COMPLICATIONS OF TREATMENT: none  FOLLOW UP COMPLIANCE: keeps appointments   PHYSICAL EXAM:  BP (!) 156/78   Temp 97.6 F (36.4 C)   Resp 18   Wt 204 lb 0.6 oz (92.5 kg)   BMI 33.95 kg/m  Well-developed elderly female in NAD. Motor sensory and DTR levels are equal and symmetric in the lower extremities. Range of motion of her lower extremities does not elicit pain. Proprioception is intact. Well-developed well-nourished patient in NAD. HEENT reveals PERLA, EOMI, discs not visualized.  Oral cavity is clear. No oral mucosal lesions are identified. Neck is clear without evidence of cervical or supraclavicular adenopathy. Lungs are clear to A&P.  Cardiac examination is essentially unremarkable with regular rate and rhythm without murmur rub or thrill. Abdomen is benign with no organomegaly or masses noted. Motor sensory and DTR levels are equal and symmetric in the upper and lower extremities. Cranial nerves II through XII are grossly intact. Proprioception is intact. No peripheral adenopathy or edema is identified. No motor or sensory levels are noted. Crude visual fields are within normal range.  RADIOLOGY RESULTS: MRI scan and PET/CT scans are reviewed and compatible above-stated findings  PLAN: At this time I to go ahead with a course of palliative radiation therapy to her lumbar spine and SI joints. Since all been Compazine a large amount of bowel I would treat with A course of 3000 cGy in 15 fractions. Risks and benefits of treatment including skin reaction fatigue alteration of blood counts possible diarrhea all were discussed in detail with the patient. She seems to comprehend my treatment plan well. I have personally ordered and scheduled CT simulation.  I would like to take this opportunity to thank you for allowing me to participate in the care of your patient.Noreene Filbert, MD

## 2018-02-17 NOTE — Telephone Encounter (Signed)
Patient called to ask if she needs to stop chemotherapy prior to having a cyst removed form her finger next week by ortho. Please advise

## 2018-02-17 NOTE — Telephone Encounter (Signed)
Patient informed of doctor response and no need to stop treatment for this. Left message on voice mail

## 2018-02-17 NOTE — Telephone Encounter (Signed)
  Patient not getting chemotherapy.  She is getting hormonal therapy (monthly Faslodex).  No need to stop her treatment.  M

## 2018-02-19 ENCOUNTER — Other Ambulatory Visit: Payer: Self-pay | Admitting: *Deleted

## 2018-02-20 MED ORDER — TRAMADOL HCL 50 MG PO TABS: ORAL_TABLET | ORAL | 0 refills | Status: DC

## 2018-02-20 NOTE — Telephone Encounter (Signed)
Given a current oncological diagnosis, this patient has the potential to experience significant cancer related pain. Pain has been improved with the prescribed dose of Tramadol. Benefits versus risks associated with continued therapy considered. Will continue pain management as previously prescribed. Patient educated that medications should not be bitten, chewed, or crushed. Additionally, safety precautions reviewed. She understands need to change positions slowly, and that this medication has the potential of lowering her blood pressure, which could lead to falls. Patient verbalized understanding that medications should not be sold or shared, taken with alcohol, or used while driving.

## 2018-02-24 ENCOUNTER — Ambulatory Visit
Admission: RE | Admit: 2018-02-24 | Discharge: 2018-02-24 | Disposition: A | Discharge status: Home / Self Care | Payer: Medicare Other | Source: Ambulatory Visit | Attending: Radiation Oncology | Admitting: Radiation Oncology

## 2018-02-24 DIAGNOSIS — Z510 Encounter for antineoplastic radiation therapy: Secondary | ICD-10-CM | POA: Diagnosis not present

## 2018-02-24 DIAGNOSIS — Z853 Personal history of malignant neoplasm of breast: Secondary | ICD-10-CM | POA: Insufficient documentation

## 2018-02-24 DIAGNOSIS — C7951 Secondary malignant neoplasm of bone: Secondary | ICD-10-CM | POA: Insufficient documentation

## 2018-02-24 DIAGNOSIS — Z170 Estrogen receptor positive status [ER+]: Secondary | ICD-10-CM | POA: Diagnosis not present

## 2018-02-24 DIAGNOSIS — C50412 Malignant neoplasm of upper-outer quadrant of left female breast: Secondary | ICD-10-CM | POA: Diagnosis not present

## 2018-02-26 DIAGNOSIS — C50412 Malignant neoplasm of upper-outer quadrant of left female breast: Secondary | ICD-10-CM | POA: Diagnosis not present

## 2018-02-26 DIAGNOSIS — Z170 Estrogen receptor positive status [ER+]: Secondary | ICD-10-CM | POA: Diagnosis not present

## 2018-02-26 DIAGNOSIS — C7951 Secondary malignant neoplasm of bone: Secondary | ICD-10-CM | POA: Diagnosis not present

## 2018-02-26 DIAGNOSIS — Z853 Personal history of malignant neoplasm of breast: Secondary | ICD-10-CM | POA: Diagnosis not present

## 2018-02-26 DIAGNOSIS — Z510 Encounter for antineoplastic radiation therapy: Secondary | ICD-10-CM | POA: Diagnosis not present

## 2018-02-27 DIAGNOSIS — M67441 Ganglion, right hand: Secondary | ICD-10-CM | POA: Diagnosis not present

## 2018-03-03 ENCOUNTER — Ambulatory Visit
Admission: RE | Admit: 2018-03-03 | Discharge: 2018-03-03 | Disposition: A | Discharge status: Home / Self Care | Payer: Medicare Other | Source: Ambulatory Visit | Attending: Radiation Oncology | Admitting: Radiation Oncology

## 2018-03-03 DIAGNOSIS — Z170 Estrogen receptor positive status [ER+]: Secondary | ICD-10-CM | POA: Diagnosis not present

## 2018-03-03 DIAGNOSIS — Z510 Encounter for antineoplastic radiation therapy: Secondary | ICD-10-CM | POA: Diagnosis not present

## 2018-03-03 DIAGNOSIS — C7951 Secondary malignant neoplasm of bone: Secondary | ICD-10-CM | POA: Diagnosis not present

## 2018-03-03 DIAGNOSIS — C50412 Malignant neoplasm of upper-outer quadrant of left female breast: Secondary | ICD-10-CM | POA: Diagnosis not present

## 2018-03-03 DIAGNOSIS — Z853 Personal history of malignant neoplasm of breast: Secondary | ICD-10-CM | POA: Diagnosis not present

## 2018-03-04 ENCOUNTER — Ambulatory Visit
Admission: RE | Admit: 2018-03-04 | Discharge: 2018-03-04 | Disposition: A | Discharge status: Home / Self Care | Payer: Medicare Other | Source: Ambulatory Visit | Attending: Radiation Oncology | Admitting: Radiation Oncology

## 2018-03-04 DIAGNOSIS — C7951 Secondary malignant neoplasm of bone: Secondary | ICD-10-CM | POA: Diagnosis not present

## 2018-03-04 DIAGNOSIS — Z510 Encounter for antineoplastic radiation therapy: Secondary | ICD-10-CM | POA: Diagnosis not present

## 2018-03-04 DIAGNOSIS — Z853 Personal history of malignant neoplasm of breast: Secondary | ICD-10-CM | POA: Diagnosis not present

## 2018-03-05 ENCOUNTER — Other Ambulatory Visit: Payer: Self-pay | Admitting: Internal Medicine

## 2018-03-05 ENCOUNTER — Ambulatory Visit
Admission: RE | Admit: 2018-03-05 | Discharge: 2018-03-05 | Disposition: A | Discharge status: Home / Self Care | Payer: Medicare Other | Source: Ambulatory Visit | Attending: Radiation Oncology | Admitting: Radiation Oncology

## 2018-03-05 DIAGNOSIS — C7951 Secondary malignant neoplasm of bone: Secondary | ICD-10-CM | POA: Diagnosis not present

## 2018-03-05 DIAGNOSIS — Z510 Encounter for antineoplastic radiation therapy: Secondary | ICD-10-CM | POA: Diagnosis not present

## 2018-03-05 DIAGNOSIS — K589 Irritable bowel syndrome without diarrhea: Secondary | ICD-10-CM

## 2018-03-05 DIAGNOSIS — Z853 Personal history of malignant neoplasm of breast: Secondary | ICD-10-CM | POA: Diagnosis not present

## 2018-03-05 DIAGNOSIS — G5793 Unspecified mononeuropathy of bilateral lower limbs: Secondary | ICD-10-CM

## 2018-03-06 ENCOUNTER — Encounter: Payer: Self-pay | Admitting: Hematology and Oncology

## 2018-03-06 ENCOUNTER — Inpatient Hospital Stay: Payer: Medicare Other | Admitting: Hematology and Oncology

## 2018-03-06 ENCOUNTER — Ambulatory Visit: Payer: Medicare Other

## 2018-03-06 ENCOUNTER — Ambulatory Visit
Admission: RE | Admit: 2018-03-06 | Discharge: 2018-03-06 | Disposition: A | Discharge status: Home / Self Care | Payer: Medicare Other | Source: Ambulatory Visit | Attending: Radiation Oncology | Admitting: Radiation Oncology

## 2018-03-06 ENCOUNTER — Ambulatory Visit: Payer: Medicare Other | Admitting: Hematology and Oncology

## 2018-03-06 ENCOUNTER — Inpatient Hospital Stay: Payer: Medicare Other

## 2018-03-06 ENCOUNTER — Other Ambulatory Visit: Payer: Self-pay | Admitting: Urgent Care

## 2018-03-06 VITALS — BP 128/78 | HR 72 | Temp 98.2°F | Resp 18 | Wt 204.0 lb

## 2018-03-06 DIAGNOSIS — M25559 Pain in unspecified hip: Secondary | ICD-10-CM

## 2018-03-06 DIAGNOSIS — C7951 Secondary malignant neoplasm of bone: Secondary | ICD-10-CM | POA: Diagnosis not present

## 2018-03-06 DIAGNOSIS — Z510 Encounter for antineoplastic radiation therapy: Secondary | ICD-10-CM | POA: Diagnosis not present

## 2018-03-06 DIAGNOSIS — C50412 Malignant neoplasm of upper-outer quadrant of left female breast: Secondary | ICD-10-CM | POA: Diagnosis not present

## 2018-03-06 DIAGNOSIS — M8580 Other specified disorders of bone density and structure, unspecified site: Secondary | ICD-10-CM

## 2018-03-06 DIAGNOSIS — Z170 Estrogen receptor positive status [ER+]: Principal | ICD-10-CM

## 2018-03-06 DIAGNOSIS — Z5111 Encounter for antineoplastic chemotherapy: Secondary | ICD-10-CM | POA: Diagnosis not present

## 2018-03-06 DIAGNOSIS — R635 Abnormal weight gain: Secondary | ICD-10-CM

## 2018-03-06 DIAGNOSIS — Z7189 Other specified counseling: Secondary | ICD-10-CM

## 2018-03-06 DIAGNOSIS — M85851 Other specified disorders of bone density and structure, right thigh: Secondary | ICD-10-CM

## 2018-03-06 DIAGNOSIS — Z853 Personal history of malignant neoplasm of breast: Secondary | ICD-10-CM | POA: Diagnosis not present

## 2018-03-06 LAB — CBC WITH DIFFERENTIAL/PLATELET
Basophils Absolute: 0 10*3/uL (ref 0–0.1)
Basophils Relative: 1 %
Eosinophils Absolute: 0.5 10*3/uL (ref 0–0.7)
Eosinophils Relative: 7 %
HCT: 35.9 % (ref 35.0–47.0)
Hemoglobin: 12.2 g/dL (ref 12.0–16.0)
Lymphocytes Relative: 15 %
Lymphs Abs: 1 10*3/uL (ref 1.0–3.6)
MCH: 29.7 pg (ref 26.0–34.0)
MCHC: 34 g/dL (ref 32.0–36.0)
MCV: 87.3 fL (ref 80.0–100.0)
Monocytes Absolute: 0.5 10*3/uL (ref 0.2–0.9)
Monocytes Relative: 8 %
Neutro Abs: 4.4 10*3/uL (ref 1.4–6.5)
Neutrophils Relative %: 69 %
Platelets: 200 10*3/uL (ref 150–440)
RBC: 4.11 MIL/uL (ref 3.80–5.20)
RDW: 14.3 % (ref 11.5–14.5)
WBC: 6.3 10*3/uL (ref 3.6–11.0)

## 2018-03-06 LAB — COMPREHENSIVE METABOLIC PANEL
ALT: 10 U/L — ABNORMAL LOW (ref 14–54)
AST: 20 U/L (ref 15–41)
Albumin: 3.9 g/dL (ref 3.5–5.0)
Alkaline Phosphatase: 64 U/L (ref 38–126)
Anion gap: 8 (ref 5–15)
BUN: 26 mg/dL — ABNORMAL HIGH (ref 6–20)
CO2: 25 mmol/L (ref 22–32)
Calcium: 9.5 mg/dL (ref 8.9–10.3)
Chloride: 101 mmol/L (ref 101–111)
Creatinine, Ser: 0.88 mg/dL (ref 0.44–1.00)
GFR calc Af Amer: >60 mL/min (ref >60)
GFR calc non Af Amer: 56 mL/min — ABNORMAL LOW (ref >60)
Glucose, Bld: 101 mg/dL — ABNORMAL HIGH (ref 65–99)
Potassium: 3.9 mmol/L (ref 3.5–5.1)
Sodium: 134 mmol/L — ABNORMAL LOW (ref 135–145)
Total Bilirubin: 0.6 mg/dL (ref 0.3–1.2)
Total Protein: 7.1 g/dL (ref 6.5–8.1)

## 2018-03-06 MED ORDER — DENOSUMAB 120 MG/1.7ML ~~LOC~~ SOLN
120.0000 mg | Freq: Once | SUBCUTANEOUS | Status: AC
  Administered 2018-03-06: 120 mg via SUBCUTANEOUS
  Filled 2018-03-06: qty 1.7

## 2018-03-06 MED ORDER — FULVESTRANT 250 MG/5ML IM SOLN
500.0000 mg | Freq: Once | INTRAMUSCULAR | Status: AC
  Administered 2018-03-06: 500 mg via INTRAMUSCULAR
  Filled 2018-03-06: qty 10

## 2018-03-06 NOTE — Progress Notes (Signed)
[facility] day:  03/06/2018   Chief Complaint: Kelli Williams is a 84 y.o. female with metastatic breast cancer who is seen for a 1 month assessment and continuation of Faslodex and Xgeva.  HPI:  The patient was last seen in the medical oncology clinic on 02/05/2018.  At that time, she had ongoing back pain (7 out of 10).  She had gained weight.  Exam revealed lumbar pain on palpation.  CA27.29 was decreasing.  She received Faslodex and Xgeva.  Lumbar spine MRI on 02/11/2018 revealed multifocal lumbosacral vertebral body signal abnormalities consistent with osseous metastatic disease. The largest lesion was at L1.  There was no pathologic compression fracture. There was multilevel moderate-to-severe neural foraminal stenosis, which had progressed since the prior study.  She was referred to Dr Baruch Gouty.  Plan is for a course of palliative radiation therapy to her lumbar spine and SI joints.  A course of 3000 cGy in 15 fractions was discussed.  She began radiation on 03/04/2018.  During the interim, she notes left hip pain (3 out of 10).  She denies any back pain.  She is tolerating her treatment well.  She notes a small hematoma s/p her last Faslodex injection.   Past Medical History:  Diagnosis Date  . Anxiety   . Arthritis    BOTH FEET AND KNEES  . Breast cancer ([place]) 2000   left breast ca with lumpectomy and rad tx 5 yr tamoxifen/Dr Jeannine Kitten at [place]  . Complication of anesthesia    WOKE UP DURING SURGERY FOR DEVIATED SEPTUM, KNEE REPLACEMENT AND BREAST LUMPECTOMY  . Dysrhythmia   . GERD (gastroesophageal reflux disease)   . Hypertension   . Neuropathic pain of both legs   . Neuropathy   . Pneumonia 2016  . Skin cancer of forehead   . Skin cancer of nose     Past Surgical History:  Procedure Laterality Date  . ABDOMINAL HYSTERECTOMY    . AXILLARY LYMPH NODE BIOPSY Left 07/10/2017   INVASIVE MAMMARY CARCINOMA WITH FOCAL LOBULAR  FEATURES.   Marland Kitchen BREAST BIOPSY Bilateral 1960   benign  . BREAST LUMPECTOMY Left 2000   breast ca with rad tx  . BREAST LUMPECTOMY Left 08/20/2017  . BREAST LUMPECTOMY Left 08/20/2017   Procedure: left breast wide excision;  Surgeon: Robert Bellow, MD;  Location: [facility] ORS;  Service: General;  Laterality: Left;  . CATARACT EXTRACTION     left eye  . CHOLECYSTECTOMY    . JOINT REPLACEMENT    . NASAL SEPTUM SURGERY    . PARTIAL KNEE ARTHROPLASTY     right  . TOTAL VAGINAL HYSTERECTOMY      Family History  Problem Relation Age of Onset  . Heart disease Mother   . Heart attack Paternal Uncle   . Breast cancer Daughter 91       genetic negative  . Cancer Paternal Grandmother     Social History:  reports that she has never smoked. She has never used smokeless tobacco. She reports that she does not drink alcohol or use drugs.  The patient's husband died 2 years ago.  She was born outside of [place].  She previously worked as an Web designer.  She lives at the [place] [facility] [place].  She is independent except for the use of her walker. Patient has a daughter, Chapel Silverthorn.  The patient is accompanied by her daughter, Lalla Brothers from [place], today.  Allergies:  Allergies  Allergen Reactions  . Amitriptyline Hives  . Amoxicillin     Diarrhea    . Ivp Dye [Iodinated Diagnostic Agents]   . Sulfa Antibiotics Rash  . Sulfa Antibiotics Rash    Current Medications: Current Outpatient Medications  Medication Sig Dispense Refill  . acetaminophen (TYLENOL) 325 MG tablet Take 650 mg by mouth every 6 (six) hours as needed.    Marland Kitchen aspirin 81 MG tablet Take 1 tablet by mouth daily.    . Calcium-Magnesium 500-250 MG TABS Take 1 tablet by mouth daily.     . Coenzyme Q10 (CO Q-10) 100 MG CAPS Take by mouth daily.    Marland Kitchen lisinopril-hydrochlorothiazide (PRINZIDE,ZESTORETIC) 20-25 MG tablet TAKE 1 AND 1/2 TABLETS BY  MOUTH DAILY 135 tablet 3  . Multiple Vitamins-Minerals  (MULTIVITAMIN WITH MINERALS) tablet Take 1 tablet by mouth daily.    Marland Kitchen nystatin (MYCOSTATIN/NYSTOP) powder Apply topically 4 (four) times daily. 15 g 3  . propranolol (INDERAL) 10 MG tablet Take 10 mg by mouth 3 (three) times daily.    . traMADol (ULTRAM) 50 MG tablet One half to one tablet every 8 hours as needed pain 90 tablet 0  . gabapentin (NEURONTIN) 300 MG capsule TAKE 3 CAPSULES BY MOUTH  TWO TIMES DAILY 540 capsule 1  . meloxicam (MOBIC) 15 MG tablet TAKE 1 TABLET BY MOUTH  DAILY 90 tablet 1  . omeprazole (PRILOSEC) 20 MG capsule TAKE 1 CAPSULE BY MOUTH  DAILY 90 capsule 1   No current facility-administered medications for this visit.     Review of Systems:  GENERAL:  Feels "the same".  No fevers or sweats.  Weight up 3 pounds.  PERFORMANCE STATUS (ECOG):  2 HEENT:  No visual changes, runny nose, sore throat, mouth sores or tenderness.  Plans to have crown replaced. Lungs: No shortness of breath or cough.  No hemoptysis. Cardiac:  No chest pain, palpitations, orthopnea, or PND. GI:  Irritable bowel.  No nausea, vomiting,constipation, melena or hematochezia. GU:  No urgency, frequency, dysuria, or hematuria. Musculoskeletal:  Left hip and knee problems (chronic).  Back pain (see HPI).  Right middle finger arthritis.  No muscle tenderness. Extremities:  No pain or swelling. Skin:  No rashes or skin changes. Neuro:  No headache, numbness or weakness, balance or coordination issues. Endocrine:  No diabetes, thyroid issues, hot flashes or night sweats. Psych:  No mood changes, depression or anxiety.  She "dreads her infusions". Pain:  Hip pain (3 out of 10).  Review of systems:  All other systems reviewed and found to be negative.  Physical Exam: Blood pressure 128/78, pulse 72, temperature 98.2 F (36.8 C), resp. rate 18, weight 204 lb (92.5 kg). GENERAL:  Elderly woman sitting comfortably in the exam room in no acute distress.  She needs assistance onto the table.  She has a  rolling walker by her side. MENTAL STATUS:  Alert and oriented to person, place and time. HEAD:  Short gray styled hair.  Normocephalic, atraumatic, face symmetric, no Cushingoid features. EYES:  Hazel eyes.  Pupils equal round and reactive to light and accomodation.  No conjunctivitis or scleral icterus. ENT:  Oropharynx clear without lesion.  Tongue normal. Mucous membranes moist.  RESPIRATORY:  Clear to auscultation without rales, wheezes or rhonchi. CARDIOVASCULAR:  Regular rate and rhythm without murmur, rub or gallop.  ABDOMEN:  Soft, non-tender, with active bowel sounds, and no hepatosplenomegaly.  No masses. SKIN:  5-10 mm palpable nodule at old injection site without overlying ecchymosis.  No rashes, ulcers or lesions. EXTREMITIES: Chronic lower extremity changes.  No skin discoloration or tenderness.  No palpable cords. LYMPH NODES: Left axillary scarring/fullness.  No palpable cervical, supraclavicular, axillary or inguinal adenopathy  NEUROLOGICAL: Unremarkable. PSYCH:  Appropriate.   No visits with results within 3 Day(s) from this visit.  Latest known visit with results is:  Hospital Outpatient Visit on 07/10/2017  Component Date Value Ref Range Status  . SURGICAL PATHOLOGY 07/10/2017    Final-Edited                   Value:Surgical Pathology THIS IS AN ADDENDUM REPORT CASE: [id number] PATIENT: [facility] Surgical Pathology Report Addendum   Reason for Addendum #1:  Additional clinical/test information  SPECIMEN SUBMITTED: A. Axilla, left  CLINICAL HISTORY: Left axillary mass in a patient with history of prior ipsilateral breast cancer  PRE-OPERATIVE DIAGNOSIS: Metastatic disease vs second primary cancer  POST-OPERATIVE DIAGNOSIS: None provided.     DIAGNOSIS: A.  LEFT AXILLA; ULTRASOUND-GUIDED NEEDLE CORE BIOPSY: - INVASIVE MAMMARY CARCINOMA WITH FOCAL LOBULAR FEATURES.  Size of invasive carcinoma:  1.1 cm in this sample Histologic grade of  invasive carcinoma: Grade 2      Glandular/tubular differentiation score: 3      Nuclear pleomorphism score: 2      Mitotic rate score: 1  ER/PR/HER2: Immunohistochemistry will be performed, with reflex to [place] for HER2 2+ (equivocal) results. The results will be reported in an addendum.  The                          diagnosis was called to Eino Farber of [facility] on 07/11/17 at 227 PM.   GROSS DESCRIPTION:  A. The specimen is received in a formalin-filled container labeled with the patient's name and left axillary mass.  Core pieces: multiple, 2.0 x 0.9 x 0.1 cm Comments: yellow to red lobulated fibrofatty, marked green  Entirely submitted in cassette(s): 1  Time/Date in fixative: placed in formalin at 1:50 PM on 07/10/17 cold ischemic time of less than 5 minutes Total fixation time: 6.5 hours    Final Diagnosis performed by Delorse Lek, MD.  Electronically signed 07/11/2017 2:29:14PM   The electronic signature indicates that the named Attending Pathologist has evaluated the specimen  Technical component performed at [facility], [address] [place], [place] [address] Lab: [phone] Dir: Darrick Penna. Evette Doffing, MD  Professional component performed at [facility], [facility], [place], [place], [place] [address] Lab: [phone]                          Dir: Dellia Nims. Rubinas, MD   Breast Biomarker Reporting Template  BREAST BIOMARKER TESTS Estrogen Receptor (ER) Status: POSITIVE, >90% nuclear staining      Average intensity of staining: Strong Progesterone Receptor (PgR) Status: POSITIVE, >90% nuclear staining      Average intensity of staining: Strong HER2 (by immunohistochemistry): EQUIVOCAL, 2+ Percentage of cells with uniform intense complete membrane staining: 0%  METHODS Cold Ischemia and Fixation Times: Meet requirements specified in latest version of the ASCO/CAP guidelines Testing Performed on Block Number(s):  A1 Fixative: Formalin Estrogen Receptor:  FDA cleared ([place])                    Primary Antibody:  SP1 Progesterone Receptor: FDA cleared ([place])  Primary Antibody: 1E2 HER2 (by immunohistochemistry): FDA approved (DAKO)                             Primary Antibody: HercepTest  Immunohistochemistry controls worked appropriately. Slides were prepared by [facility] for [facility], [place], [place], and interpreted by Dr. Luana Shu.      Addendum #1 performed by Delorse Lek, MD.  Electronically signed 07/15/2017 1:36:03PM    Technical component performed at [facility], [address] [place], [place] [address] Lab: [phone] Dir: Darrick Penna. Evette Doffing, MD  Professional component performed at [facility], [facility], [place], [place], [place] [address] Lab: [phone] Dir: Dellia Nims. Rubinas, MD      Assessment:  Kelli Williams is a 84 y.o. female with metastatic breast cancer s/p left axillary biopsy on 07/10/2017.  Pathology revealed a 1.1 cm grade II invasive mammary carcinoma with focal lobular features.   Wide excision on 08/20/2017 revealed metastatic mammary carcinoma involving 1 of 4 axillary lymph nodes with extracapsular extension.  The superior medial margin was involved.  Tumor was ER + (> 90%), PR + (> 90%), and Her2/neu 2+ (FISH -).   [facility] genetic testing on 09/26/2017 revealed no pathogenic sequence variants or deletions/duplications.  BRCA1/2 testing on 09/26/2017 was negative.  She has a history of left breast cancer s/p wide excision and sentinel lymph node biopsy on 07/18/1999 at [facility].  Pathology revealed a 0.8 x 0.6 x 0.4 cm grade II invasive ductal adenocarcinoma.  There was no lymphovascular invasion.  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  Margins were negative.  Tumor was ER + (50%) and PR + (90%).  Pathologic stage was T1bN0.  She received 6200 cGy from 08/21/1999 -  10/03/1999.  She completed 5 years of tamoxifen in 06/2004.    PET scan on 09/16/2017 revealed widespread scattered hypermetabolic lytic osseous metastases throughout the axial and proximal appendicular skeleton.  Bilateral proximal femoral skeletal metastases placed the patient at risk for pathologic hip fractures.  There was mild hypermetabolic right hilar lymphadenopathy.  Plain films of the bilateral femurs on 09/23/2017 revealed no pathologic fracture. The LEFT view showed lytic subtrochanteric metastasis that was previously seen on PET scan.   Lumbar spine MRI on 02/11/2018 revealed multifocal lumbosacral vertebral body signal abnormalities consistent with osseous metastatic disease. The largest lesion was at L1.  There was no pathologic compression fracture. There was multilevel moderate-to-severe neural foraminal stenosis, which had progressed since the prior study.  CA27.29 has been followed:  14.7 on 02/05/2017, 19.1 on 02/06/2012, 172.2 on 09/04/2017, 144.9 on 11/06/2017, and 73.4 on 02/05/2018.  She received a palliative course of radiation (3000 cGy in 10 fractions) to the left hip from 10/14/2017 - 10/27/2017.  She began a course of palliative radiation the the lumbar spine and SI joints on 03/04/2018.  She began Faslodex on 09/26/2017 (last 02/05/2017).  She began monthly Xgeva on 09/26/2017 (last 02/05/2018).    Bone density on 09/01/2017 revealed osteopenia with a T-score of -1.9 in the right femoral neck and left forearm. She is on calcium and vitamin D.  Symptomatically, she hip pain (3 out of 10).  She has gained weight.  Exam is stable.  Plan: 1.  Labs today:  CBC with diff, CMP, CA27.29. 2.  Review interval  lumbar sacral MRI.  Discuss multi-focal metastatic disease. 3.  Discuss initiation of radiation. 4.  Faslodex injection today. 5.  Xgeva injection today. 6.  Discuss follow-up with dentistry.  If dental issue involving extraction, will need to hold Xgeva. 7.  Continue  supplemental calcium 1200 mg and vitamin D 800 IU daily. 8.  Refer to nutrition re: weight gain per patient request. 9.  RTC in 1 month for MD assessment, labs (CBC with diff, CMP, CA27.29), Faslodex, and Xgeva.   Lequita Asal, MD  03/06/2018, 4:35 PM

## 2018-03-06 NOTE — Progress Notes (Signed)
Pt in for follow up, reports having pain in left hip and a blister on right ring finger.

## 2018-03-07 LAB — CA 27.29 (SERIAL MONITOR): CA 27.29: 63.3 U/mL — ABNORMAL HIGH (ref 0.0–38.6)

## 2018-03-09 ENCOUNTER — Ambulatory Visit
Admission: RE | Admit: 2018-03-09 | Discharge: 2018-03-09 | Disposition: A | Discharge status: Home / Self Care | Payer: Medicare Other | Source: Ambulatory Visit | Attending: Radiation Oncology | Admitting: Radiation Oncology

## 2018-03-09 ENCOUNTER — Inpatient Hospital Stay: Payer: Medicare Other | Attending: Radiation Oncology

## 2018-03-09 DIAGNOSIS — C50412 Malignant neoplasm of upper-outer quadrant of left female breast: Secondary | ICD-10-CM | POA: Insufficient documentation

## 2018-03-09 DIAGNOSIS — Z510 Encounter for antineoplastic radiation therapy: Secondary | ICD-10-CM | POA: Diagnosis not present

## 2018-03-09 DIAGNOSIS — C7951 Secondary malignant neoplasm of bone: Secondary | ICD-10-CM | POA: Insufficient documentation

## 2018-03-09 DIAGNOSIS — Z170 Estrogen receptor positive status [ER+]: Secondary | ICD-10-CM | POA: Diagnosis not present

## 2018-03-09 DIAGNOSIS — R110 Nausea: Secondary | ICD-10-CM | POA: Insufficient documentation

## 2018-03-09 DIAGNOSIS — E860 Dehydration: Secondary | ICD-10-CM | POA: Insufficient documentation

## 2018-03-09 DIAGNOSIS — Z5111 Encounter for antineoplastic chemotherapy: Secondary | ICD-10-CM | POA: Insufficient documentation

## 2018-03-09 NOTE — Progress Notes (Signed)
Nutrition Assessment   Reason for Assessment:  Referral as patient concerned about weight gain  ASSESSMENT:  84 year old female with metastatic breast cancer also includes lumborsacral vertebral body, lesion at L1.  Patient receiving palliative radiation therapy currently.  Also taking faslodex and xgeva.    Met patient in clinic this pm.  Patient using walker to move around.  Reports activity level is low due to pain in back, hip, knees.  Uses walker most often.  Reports that she typically has cereal and fruit and coffee for breakfast or egg and 2 sausage links.  Received 1 meal per day from [facility] at [place]. Does not like food, seasoning at [facility].  Has met with Food service Director and hoping to meet with Dietitian soon.  Loves sweets and can taste those better than anything. Reports some issues with nausea at times and loose stool from radiation.  Concerned about weight gain over the last 3 years  Nutrition Focused Physical Exam: defered  Medications: reviewed  Labs: reviewed  Anthropometrics:   Height: 65 inches Weight: 204 lb UBW: 175 lb unsure how long ago that was.  Noted 08/04/17 194 lb BMI: 33   INTERVENTION:   Discussed importance of not skipping meals.   Encouraged choosing lower calorie food options in place of high calorie foods more often to help with meeting weight loss goals.   Aquatic exercise is not an option per patient at [facility].   Handout from [facility] regarding weight loss tips.  Reviewed with patient.      MONITORING, EVALUATION, GOAL: Patient will consume adequate nutrition to maintain weight   NEXT VISIT: as needed.  Patient to contact me  Davari Lopes B. Zenia Resides, [place], [place] Registered Dietitian [phone] (pager)

## 2018-03-10 ENCOUNTER — Ambulatory Visit
Admission: RE | Admit: 2018-03-10 | Discharge: 2018-03-10 | Disposition: A | Discharge status: Home / Self Care | Payer: Medicare Other | Source: Ambulatory Visit | Attending: Radiation Oncology | Admitting: Radiation Oncology

## 2018-03-10 DIAGNOSIS — Z510 Encounter for antineoplastic radiation therapy: Secondary | ICD-10-CM | POA: Diagnosis not present

## 2018-03-10 DIAGNOSIS — C50412 Malignant neoplasm of upper-outer quadrant of left female breast: Secondary | ICD-10-CM | POA: Diagnosis not present

## 2018-03-10 DIAGNOSIS — C7951 Secondary malignant neoplasm of bone: Secondary | ICD-10-CM | POA: Diagnosis not present

## 2018-03-10 DIAGNOSIS — Z170 Estrogen receptor positive status [ER+]: Secondary | ICD-10-CM | POA: Diagnosis not present

## 2018-03-11 ENCOUNTER — Ambulatory Visit
Admission: RE | Admit: 2018-03-11 | Discharge: 2018-03-11 | Disposition: A | Discharge status: Home / Self Care | Payer: Medicare Other | Source: Ambulatory Visit | Attending: Radiation Oncology | Admitting: Radiation Oncology

## 2018-03-11 DIAGNOSIS — Z170 Estrogen receptor positive status [ER+]: Secondary | ICD-10-CM | POA: Diagnosis not present

## 2018-03-11 DIAGNOSIS — Z510 Encounter for antineoplastic radiation therapy: Secondary | ICD-10-CM | POA: Diagnosis not present

## 2018-03-11 DIAGNOSIS — C50412 Malignant neoplasm of upper-outer quadrant of left female breast: Secondary | ICD-10-CM | POA: Diagnosis not present

## 2018-03-12 ENCOUNTER — Ambulatory Visit
Admission: RE | Admit: 2018-03-12 | Discharge: 2018-03-12 | Disposition: A | Discharge status: Home / Self Care | Payer: Medicare Other | Source: Ambulatory Visit | Attending: Radiation Oncology | Admitting: Radiation Oncology

## 2018-03-12 DIAGNOSIS — Z170 Estrogen receptor positive status [ER+]: Secondary | ICD-10-CM | POA: Diagnosis not present

## 2018-03-12 DIAGNOSIS — Z510 Encounter for antineoplastic radiation therapy: Secondary | ICD-10-CM | POA: Diagnosis not present

## 2018-03-12 DIAGNOSIS — C50412 Malignant neoplasm of upper-outer quadrant of left female breast: Secondary | ICD-10-CM | POA: Diagnosis not present

## 2018-03-13 ENCOUNTER — Ambulatory Visit: Payer: Medicare Other

## 2018-03-16 ENCOUNTER — Ambulatory Visit: Payer: Medicare Other

## 2018-03-17 ENCOUNTER — Ambulatory Visit: Payer: Medicare Other

## 2018-03-18 ENCOUNTER — Ambulatory Visit
Admission: RE | Admit: 2018-03-18 | Discharge: 2018-03-18 | Disposition: A | Discharge status: Home / Self Care | Payer: Medicare Other | Source: Ambulatory Visit | Attending: Radiation Oncology | Admitting: Radiation Oncology

## 2018-03-18 DIAGNOSIS — C50412 Malignant neoplasm of upper-outer quadrant of left female breast: Secondary | ICD-10-CM | POA: Diagnosis not present

## 2018-03-18 DIAGNOSIS — Z170 Estrogen receptor positive status [ER+]: Secondary | ICD-10-CM | POA: Diagnosis not present

## 2018-03-18 DIAGNOSIS — Z510 Encounter for antineoplastic radiation therapy: Secondary | ICD-10-CM | POA: Diagnosis not present

## 2018-03-19 ENCOUNTER — Ambulatory Visit
Admission: RE | Admit: 2018-03-19 | Discharge: 2018-03-19 | Disposition: A | Discharge status: Home / Self Care | Payer: Medicare Other | Source: Ambulatory Visit | Attending: Radiation Oncology | Admitting: Radiation Oncology

## 2018-03-19 DIAGNOSIS — C50412 Malignant neoplasm of upper-outer quadrant of left female breast: Secondary | ICD-10-CM | POA: Diagnosis not present

## 2018-03-19 DIAGNOSIS — Z510 Encounter for antineoplastic radiation therapy: Secondary | ICD-10-CM | POA: Diagnosis not present

## 2018-03-19 DIAGNOSIS — Z170 Estrogen receptor positive status [ER+]: Secondary | ICD-10-CM | POA: Diagnosis not present

## 2018-03-20 ENCOUNTER — Ambulatory Visit
Admission: RE | Admit: 2018-03-20 | Discharge: 2018-03-20 | Disposition: A | Discharge status: Home / Self Care | Payer: Medicare Other | Source: Ambulatory Visit | Attending: Radiation Oncology | Admitting: Radiation Oncology

## 2018-03-20 DIAGNOSIS — Z170 Estrogen receptor positive status [ER+]: Secondary | ICD-10-CM | POA: Diagnosis not present

## 2018-03-20 DIAGNOSIS — Z510 Encounter for antineoplastic radiation therapy: Secondary | ICD-10-CM | POA: Diagnosis not present

## 2018-03-20 DIAGNOSIS — C7951 Secondary malignant neoplasm of bone: Secondary | ICD-10-CM | POA: Diagnosis not present

## 2018-03-20 DIAGNOSIS — C50412 Malignant neoplasm of upper-outer quadrant of left female breast: Secondary | ICD-10-CM | POA: Diagnosis not present

## 2018-03-23 ENCOUNTER — Ambulatory Visit
Admission: RE | Admit: 2018-03-23 | Discharge: 2018-03-23 | Disposition: A | Discharge status: Home / Self Care | Payer: Medicare Other | Source: Ambulatory Visit | Attending: Radiation Oncology | Admitting: Radiation Oncology

## 2018-03-23 DIAGNOSIS — C50412 Malignant neoplasm of upper-outer quadrant of left female breast: Secondary | ICD-10-CM | POA: Diagnosis not present

## 2018-03-23 DIAGNOSIS — Z170 Estrogen receptor positive status [ER+]: Secondary | ICD-10-CM | POA: Diagnosis not present

## 2018-03-23 DIAGNOSIS — Z510 Encounter for antineoplastic radiation therapy: Secondary | ICD-10-CM | POA: Diagnosis not present

## 2018-03-24 ENCOUNTER — Ambulatory Visit: Payer: Medicare Other

## 2018-03-24 ENCOUNTER — Ambulatory Visit
Admission: RE | Admit: 2018-03-24 | Discharge: 2018-03-24 | Disposition: A | Discharge status: Home / Self Care | Payer: Medicare Other | Source: Ambulatory Visit | Attending: Radiation Oncology | Admitting: Radiation Oncology

## 2018-03-24 DIAGNOSIS — C50412 Malignant neoplasm of upper-outer quadrant of left female breast: Secondary | ICD-10-CM | POA: Diagnosis not present

## 2018-03-24 DIAGNOSIS — Z510 Encounter for antineoplastic radiation therapy: Secondary | ICD-10-CM | POA: Diagnosis not present

## 2018-03-24 DIAGNOSIS — Z170 Estrogen receptor positive status [ER+]: Secondary | ICD-10-CM | POA: Diagnosis not present

## 2018-03-25 ENCOUNTER — Ambulatory Visit: Payer: Medicare Other

## 2018-03-25 ENCOUNTER — Ambulatory Visit
Admission: RE | Admit: 2018-03-25 | Discharge: 2018-03-25 | Disposition: A | Discharge status: Home / Self Care | Payer: Medicare Other | Source: Ambulatory Visit | Attending: Radiation Oncology | Admitting: Radiation Oncology

## 2018-03-25 DIAGNOSIS — Z170 Estrogen receptor positive status [ER+]: Secondary | ICD-10-CM | POA: Diagnosis not present

## 2018-03-25 DIAGNOSIS — Z510 Encounter for antineoplastic radiation therapy: Secondary | ICD-10-CM | POA: Diagnosis not present

## 2018-03-25 DIAGNOSIS — C50412 Malignant neoplasm of upper-outer quadrant of left female breast: Secondary | ICD-10-CM | POA: Diagnosis not present

## 2018-03-26 ENCOUNTER — Ambulatory Visit
Admission: RE | Admit: 2018-03-26 | Discharge: 2018-03-26 | Disposition: A | Discharge status: Home / Self Care | Payer: Medicare Other | Source: Ambulatory Visit | Attending: Radiation Oncology | Admitting: Radiation Oncology

## 2018-03-26 ENCOUNTER — Ambulatory Visit: Payer: Medicare Other

## 2018-03-26 DIAGNOSIS — Z170 Estrogen receptor positive status [ER+]: Secondary | ICD-10-CM | POA: Diagnosis not present

## 2018-03-26 DIAGNOSIS — C50412 Malignant neoplasm of upper-outer quadrant of left female breast: Secondary | ICD-10-CM | POA: Diagnosis not present

## 2018-03-26 DIAGNOSIS — Z510 Encounter for antineoplastic radiation therapy: Secondary | ICD-10-CM | POA: Diagnosis not present

## 2018-03-27 ENCOUNTER — Ambulatory Visit
Admission: RE | Admit: 2018-03-27 | Discharge: 2018-03-27 | Disposition: A | Discharge status: Home / Self Care | Payer: Medicare Other | Source: Ambulatory Visit | Attending: Radiation Oncology | Admitting: Radiation Oncology

## 2018-03-27 DIAGNOSIS — Z510 Encounter for antineoplastic radiation therapy: Secondary | ICD-10-CM | POA: Diagnosis not present

## 2018-03-27 DIAGNOSIS — C7951 Secondary malignant neoplasm of bone: Secondary | ICD-10-CM | POA: Diagnosis not present

## 2018-03-27 DIAGNOSIS — C50412 Malignant neoplasm of upper-outer quadrant of left female breast: Secondary | ICD-10-CM | POA: Diagnosis not present

## 2018-03-27 DIAGNOSIS — Z170 Estrogen receptor positive status [ER+]: Secondary | ICD-10-CM | POA: Diagnosis not present

## 2018-04-02 ENCOUNTER — Other Ambulatory Visit: Payer: Self-pay | Admitting: Oncology

## 2018-04-02 ENCOUNTER — Telehealth: Payer: Self-pay | Admitting: *Deleted

## 2018-04-02 ENCOUNTER — Inpatient Hospital Stay (HOSPITAL_BASED_OUTPATIENT_CLINIC_OR_DEPARTMENT_OTHER): Payer: Medicare Other | Admitting: Oncology

## 2018-04-02 ENCOUNTER — Inpatient Hospital Stay: Payer: Medicare Other

## 2018-04-02 VITALS — BP 148/90 | HR 67 | Temp 97.9°F | Resp 18 | Wt 192.0 lb

## 2018-04-02 DIAGNOSIS — C7951 Secondary malignant neoplasm of bone: Secondary | ICD-10-CM

## 2018-04-02 DIAGNOSIS — Z5111 Encounter for antineoplastic chemotherapy: Secondary | ICD-10-CM | POA: Diagnosis not present

## 2018-04-02 DIAGNOSIS — R197 Diarrhea, unspecified: Secondary | ICD-10-CM

## 2018-04-02 DIAGNOSIS — C50412 Malignant neoplasm of upper-outer quadrant of left female breast: Secondary | ICD-10-CM | POA: Diagnosis not present

## 2018-04-02 DIAGNOSIS — R634 Abnormal weight loss: Secondary | ICD-10-CM

## 2018-04-02 DIAGNOSIS — E860 Dehydration: Secondary | ICD-10-CM

## 2018-04-02 DIAGNOSIS — R509 Fever, unspecified: Secondary | ICD-10-CM

## 2018-04-02 DIAGNOSIS — R110 Nausea: Secondary | ICD-10-CM | POA: Diagnosis not present

## 2018-04-02 LAB — COMPREHENSIVE METABOLIC PANEL
ALT: 13 U/L — ABNORMAL LOW (ref 14–54)
AST: 23 U/L (ref 15–41)
Albumin: 3.7 g/dL (ref 3.5–5.0)
Alkaline Phosphatase: 50 U/L (ref 38–126)
Anion gap: 8 (ref 5–15)
BUN: 14 mg/dL (ref 6–20)
CO2: 26 mmol/L (ref 22–32)
Calcium: 8.2 mg/dL — ABNORMAL LOW (ref 8.9–10.3)
Chloride: 97 mmol/L — ABNORMAL LOW (ref 101–111)
Creatinine, Ser: 0.82 mg/dL (ref 0.44–1.00)
GFR calc Af Amer: >60 mL/min (ref >60)
GFR calc non Af Amer: >60 mL/min (ref >60)
Glucose, Bld: 107 mg/dL — ABNORMAL HIGH (ref 65–99)
Potassium: 3.6 mmol/L (ref 3.5–5.1)
Sodium: 131 mmol/L — ABNORMAL LOW (ref 135–145)
Total Bilirubin: 0.4 mg/dL (ref 0.3–1.2)
Total Protein: 6.8 g/dL (ref 6.5–8.1)

## 2018-04-02 LAB — MAGNESIUM: Magnesium: 1.9 mg/dL (ref 1.7–2.4)

## 2018-04-02 LAB — CBC WITH DIFFERENTIAL/PLATELET
Basophils Absolute: 0 10*3/uL (ref 0–0.1)
Basophils Relative: 1 %
Eosinophils Absolute: 0.7 10*3/uL (ref 0–0.7)
Eosinophils Relative: 13 %
HCT: 37 % (ref 35.0–47.0)
Hemoglobin: 12.7 g/dL (ref 12.0–16.0)
Lymphocytes Relative: 8 %
Lymphs Abs: 0.4 10*3/uL — ABNORMAL LOW (ref 1.0–3.6)
MCH: 30 pg (ref 26.0–34.0)
MCHC: 34.3 g/dL (ref 32.0–36.0)
MCV: 87.4 fL (ref 80.0–100.0)
Monocytes Absolute: 0.6 10*3/uL (ref 0.2–0.9)
Monocytes Relative: 11 %
Neutro Abs: 3.6 10*3/uL (ref 1.4–6.5)
Neutrophils Relative %: 67 %
Platelets: 230 10*3/uL (ref 150–440)
RBC: 4.23 MIL/uL (ref 3.80–5.20)
RDW: 14.5 % (ref 11.5–14.5)
WBC: 5.3 10*3/uL (ref 3.6–11.0)

## 2018-04-02 LAB — URINALYSIS, COMPLETE (UACMP) WITH MICROSCOPIC
Bilirubin Urine: NEGATIVE
Glucose, UA: NEGATIVE mg/dL
Hgb urine dipstick: NEGATIVE
Ketones, ur: NEGATIVE mg/dL
Nitrite: NEGATIVE
Protein, ur: NEGATIVE mg/dL
Specific Gravity, Urine: 1.005 (ref 1.005–1.030)
pH: 7 (ref 5.0–8.0)

## 2018-04-02 MED ORDER — ONDANSETRON HCL 4 MG/2ML IJ SOLN
INTRAMUSCULAR | Status: AC
  Filled 2018-04-02: qty 4

## 2018-04-02 MED ORDER — DEXAMETHASONE SODIUM PHOSPHATE 10 MG/ML IJ SOLN
INTRAMUSCULAR | Status: AC
  Filled 2018-04-02: qty 1

## 2018-04-02 MED ORDER — DIPHENOXYLATE-ATROPINE 2.5-0.025 MG PO TABS: 1.0000 | ORAL_TABLET | Freq: Four times a day (QID) | ORAL | 0 refills | Status: DC | PRN

## 2018-04-02 MED ORDER — SODIUM CHLORIDE 0.9 % IV SOLN: Freq: Once | INTRAVENOUS | Status: DC

## 2018-04-02 MED ORDER — SODIUM CHLORIDE 0.9 % IV SOLN
INTRAVENOUS | Status: DC
  Administered 2018-04-02: 15:00:00 via INTRAVENOUS
  Filled 2018-04-02 (×2): qty 1000

## 2018-04-02 MED ORDER — ONDANSETRON HCL 4 MG/2ML IJ SOLN
8.0000 mg | Freq: Once | INTRAMUSCULAR | Status: AC
  Administered 2018-04-02: 8 mg via INTRAVENOUS

## 2018-04-02 MED ORDER — DEXAMETHASONE SODIUM PHOSPHATE 10 MG/ML IJ SOLN
10.0000 mg | Freq: Once | INTRAMUSCULAR | Status: AC
  Administered 2018-04-02: 10 mg via INTRAVENOUS

## 2018-04-02 NOTE — Telephone Encounter (Signed)
Kelli Harp, NP called patient after hearing she refused to come in and convinced her to come in today. Appointment made for this afternoon and a cab has been arranged to pick her up for appointment

## 2018-04-02 NOTE — Telephone Encounter (Addendum)
Patient discussed with Lorretta Harp, NP and Dr Mike Gip and I offered patient appointment for today and she declined and asked about tomorrow and then declined that as well. She stated "I just wanted to know if I should come Monday. I just ate a piece of toast and  Drank some peppermint tea for the nausea and I don't feel like getting dressed and am afraid I will mess myself" I feel weak and shaky. Patient was advised that I had 2 practitioners that say she needs to come in and that Dr Mike Gip said she needs to be seen before the weekend. I offered her Lucianne Lei or cab transportation as she has transportation difficulties and she still declined. I explained to her that she could end up in the hospital if she doe snot come in and she stated she does not want that, but she thinks she will be fine until Monday. I again urged her to come in and she refused.

## 2018-04-02 NOTE — Progress Notes (Signed)
Symptom Management Consult note [facility]  [phone] [phone]  Patient Care Team: Glean Hess, MD as PCP - General (Internal Medicine) [name], MD as Consulting Physician (Cardiology) Renata Caprice as Physician Assistant (Orthopedic Surgery) Glean Hess, MD (Internal Medicine) Bary Castilla Forest Gleason, MD (General Surgery) Hessie Knows, MD as Consulting Physician (Orthopedic Surgery)   Name of the patient: Kelli Williams  [id number]  September 29, 1926   Date of visit: 04/03/18  Diagnosis- Metastatic Breast Cancer   Chief complaint/ Reason for visit- Diarrhea X 6 days  Heme/Onc history: Patient last seen by primary medical oncologist Dr. Mike Gip on 03/06/2018.  She complained of pain in her lower spine and concerns of not allowing her home health aide to participate in her ADLs.  Her tramadol was not controlling her pain but she did not want anything stronger due to an unsteady gait.  Additionally she complained of chronic knee hip and back pain rated a 7 out of 10.  She complained of depression due to weight gain.  Physical exam revealed lumbar pain on palpation.  Patient continued with Faslodex and Xgeva monthly injections.   Lumbar sacral MRI was ordered for identification of lower back pain.  Lumbar spine without contrast completed on 02/11/2017 showing lumbosacral vertebral body abnormalities that were consistent with metastatic disease.  The target lesion is at L1.  She was referred to radiation oncology for possible radiation therapy.  Met with Dr. Donella Stade on 02/17/2017 for palliative radiation therapy to her lumbar spine and SI joints.  Began treatment on 03/03/2018.  Last treatment was completed on 03/27/2018.  Patient met with dietitian on 03/09/2018 to discuss dietary intervention for healthy weight loss.  They discussed the importance of healthy eating without skipping meals, choosing lower calorie food  options and  handout was provided regarding weight loss tips.   Interval history-  Patient complains of diarrhea. Symptoms have been present for approximately 6 days. The symptoms are gradually improving. Stool frequency is approximately 5 per day. Patient estimates stool volume to be 1/2 to 1 cup per bowel movement. Diarrhea does occur at night. The patient has noted no bleeding associated with bowel movements. The also patient reports the following symptoms: bloating, loose stools, mucus with stools, watery diarrhea, weight loss and abdominal pain X 1 day (Sunday). The patient denies the following symptoms: fecal incontinence, joint aches and melena. The patient currently denies significant abdominal pain or discomfort.  Relationship to food: the diarrhea is worse with foods. Relationship to medications: the patient denies any relationship to medications. Other risk factors: immunocompromise. Therapy tried so far: OTC antidiarrheal: Immodium, results fair. Work up so far: none.  ECOG FS:1 - Symptomatic but completely ambulatory  Review of systems- Review of Systems  Constitutional: Positive for fever (Last on Tuesday 100.3) and malaise/fatigue. Negative for chills and weight loss.  HENT: Negative for congestion and ear pain.   Eyes: Negative.  Negative for blurred vision and double vision.  Respiratory: Negative.  Negative for cough, sputum production and shortness of breath.   Cardiovascular: Negative.  Negative for chest pain, palpitations and leg swelling.  Gastrointestinal: Positive for abdominal pain (one episode on Sunday), diarrhea and nausea. Negative for blood in stool, constipation and vomiting.  Genitourinary: Negative for dysuria, frequency and urgency.  Musculoskeletal: Negative for back pain and falls.  Skin: Negative.  Negative for rash.  Neurological: Positive for weakness. Negative for headaches.  Endo/Heme/Allergies: Negative.  Does not bruise/bleed easily.  Psychiatric/Behavioral: Negative.  Negative for depression. The patient is not nervous/anxious and does not have insomnia.      Current treatment- Faslodex 02/05/18, Monthly Xgeva, Palliative radiation to left hip- Last radiation was 03/27/18.   Allergies  Allergen Reactions  . Amitriptyline Hives  . Amoxicillin     Diarrhea    . Ivp Dye [Iodinated Diagnostic Agents]   . Tape Other (See Comments)    Whelps - please use paper tape.  . Sulfa Antibiotics Rash  . Sulfa Antibiotics Rash     Past Medical History:  Diagnosis Date  . Anxiety   . Arthritis    BOTH FEET AND KNEES  . Breast cancer ([place]) 2000   left breast ca with lumpectomy and rad tx 5 yr tamoxifen/Dr Jeannine Kitten at [place]  . Complication of anesthesia    WOKE UP DURING SURGERY FOR DEVIATED SEPTUM, KNEE REPLACEMENT AND BREAST LUMPECTOMY  . Dysrhythmia   . GERD (gastroesophageal reflux disease)   . Hypertension   . Neuropathic pain of both legs   . Neuropathy   . Pneumonia 2016  . Skin cancer of forehead   . Skin cancer of nose      Past Surgical History:  Procedure Laterality Date  . ABDOMINAL HYSTERECTOMY    . AXILLARY LYMPH NODE BIOPSY Left 07/10/2017   INVASIVE MAMMARY CARCINOMA WITH FOCAL LOBULAR FEATURES.   Marland Kitchen BREAST BIOPSY Bilateral 1960   benign  . BREAST LUMPECTOMY Left 2000   breast ca with rad tx  . BREAST LUMPECTOMY Left 08/20/2017  . BREAST LUMPECTOMY Left 08/20/2017   Procedure: left breast wide excision;  Surgeon: Robert Bellow, MD;  Location: [facility] ORS;  Service: General;  Laterality: Left;  . CATARACT EXTRACTION     left eye  . CHOLECYSTECTOMY    . JOINT REPLACEMENT    . NASAL SEPTUM SURGERY    . PARTIAL KNEE ARTHROPLASTY     right  . TOTAL VAGINAL HYSTERECTOMY      Social History   Socioeconomic History  . Marital status: Widowed    Spouse name: Not on file  . Number of children: Not on file  . Years of education: Not on file  . Highest education level: Not on file  Occupational  History  . Not on file  Social Needs  . Financial resource strain: Not on file  . Food insecurity:    Worry: Not on file    Inability: Not on file  . Transportation needs:    Medical: Not on file    Non-medical: Not on file  Tobacco Use  . Smoking status: Never Smoker  . Smokeless tobacco: Never Used  Substance and Sexual Activity  . Alcohol use: No    Alcohol/week: 0.0 oz  . Drug use: No  . Sexual activity: Not on file  Lifestyle  . Physical activity:    Days per week: Not on file    Minutes per session: Not on file  . Stress: Not on file  Relationships  . Social connections:    Talks on phone: Not on file    Gets together: Not on file    Attends religious service: Not on file    Active member of club or organization: Not on file    Attends meetings of clubs or organizations: Not on file    Relationship status: Not on file  . Intimate partner violence:    Fear of current or ex partner: Not on file    Emotionally abused: Not on  file    Physically abused: Not on file    Forced sexual activity: Not on file  Other Topics Concern  . Not on file  Social History Narrative   ** Merged History Encounter **        Family History  Problem Relation Age of Onset  . Heart disease Mother   . Heart attack Paternal Uncle   . Breast cancer Daughter 75       genetic negative  . Cancer Paternal Grandmother      Current Outpatient Medications:  .  acetaminophen (TYLENOL) 325 MG tablet, Take 650 mg by mouth every 6 (six) hours as needed., Disp: , Rfl:  .  aspirin 81 MG tablet, Take 1 tablet by mouth daily., Disp: , Rfl:  .  Calcium-Magnesium 500-250 MG TABS, Take 1 tablet by mouth daily. , Disp: , Rfl:  .  Coenzyme Q10 (CO Q-10) 100 MG CAPS, Take by mouth daily., Disp: , Rfl:  .  diphenoxylate-atropine (LOMOTIL) 2.5-0.025 MG tablet, Take 1 tablet by mouth 4 (four) times daily as needed for diarrhea or loose stools., Disp: 30 tablet, Rfl: 0 .  gabapentin (NEURONTIN) 300 MG  capsule, TAKE 3 CAPSULES BY MOUTH  TWO TIMES DAILY, Disp: 540 capsule, Rfl: 1 .  lisinopril-hydrochlorothiazide (PRINZIDE,ZESTORETIC) 20-25 MG tablet, TAKE 1 AND 1/2 TABLETS BY  MOUTH DAILY, Disp: 135 tablet, Rfl: 3 .  meloxicam (MOBIC) 15 MG tablet, TAKE 1 TABLET BY MOUTH  DAILY, Disp: 90 tablet, Rfl: 1 .  Multiple Vitamins-Minerals (MULTIVITAMIN WITH MINERALS) tablet, Take 1 tablet by mouth daily., Disp: , Rfl:  .  nystatin (MYCOSTATIN/NYSTOP) powder, Apply topically 4 (four) times daily., Disp: 15 g, Rfl: 3 .  omeprazole (PRILOSEC) 20 MG capsule, TAKE 1 CAPSULE BY MOUTH  DAILY, Disp: 90 capsule, Rfl: 1 .  propranolol (INDERAL) 10 MG tablet, Take 10 mg by mouth 3 (three) times daily., Disp: , Rfl:  .  traMADol (ULTRAM) 50 MG tablet, One half to one tablet every 8 hours as needed pain, Disp: 90 tablet, Rfl: 0  Current Facility-Administered Medications:  .  0.9 %  sodium chloride infusion, , Intravenous, Continuous, Rorik Vespa, Wandra Feinstein, NP, Stopped at 04/02/18 1630  Physical exam:  Vitals:   04/02/18 1444  BP: (!) 148/90  Pulse: 67  Resp: 18  Temp: 97.9 F (36.6 C)  TempSrc: Tympanic  SpO2: 96%  Weight: 192 lb (87.1 kg)   Physical Exam  Constitutional: She is oriented to person, place, and time. Vital signs are normal.  HENT:  Head: Normocephalic and atraumatic.  Eyes: Pupils are equal, round, and reactive to light.  Neck: Normal range of motion.  Cardiovascular: Normal rate, regular rhythm and normal heart sounds.  No murmur heard. Pulmonary/Chest: Effort normal and breath sounds normal. She has no wheezes.  Abdominal: Soft. Normal appearance and bowel sounds are normal. She exhibits no distension. There is no tenderness.  Musculoskeletal: Normal range of motion. She exhibits no edema.  Neurological: She is alert and oriented to person, place, and time.  Skin: Skin is warm and dry. No rash noted. There is pallor.  Psychiatric: Judgment normal.     CMP Latest Ref Rng & Units  04/02/2018  Glucose 65 - 99 mg/dL 107(H)  BUN 6 - 20 mg/dL 14  Creatinine 0.44 - 1.00 mg/dL 0.82  Sodium 135 - 145 mmol/L 131(L)  Potassium 3.5 - 5.1 mmol/L 3.6  Chloride 101 - 111 mmol/L 97(L)  CO2 22 - 32 mmol/L 26  Calcium 8.9 -  10.3 mg/dL 8.2(L)  Total Protein 6.5 - 8.1 g/dL 6.8  Total Bilirubin 0.3 - 1.2 mg/dL 0.4  Alkaline Phos 38 - 126 U/L 50  AST 15 - 41 U/L 23  ALT 14 - 54 U/L 13(L)   CBC Latest Ref Rng & Units 04/02/2018  WBC 3.6 - 11.0 K/uL 5.3  Hemoglobin 12.0 - 16.0 g/dL 12.7  Hematocrit 35.0 - 47.0 % 37.0  Platelets 150 - 440 K/uL 230    No images are attached to the encounter.  No results found.  Assessment and plan- Patient is a 84 y.o. female who presents with persistent diarrhea X 6 days.   1. Metastatic Breast Cancer: S/p recent radiation to known metastasis to left hip/ Tolerated well. Continues to receive monthly xgeva for known bony mets and Faslodex monthly. She is scheduled to follow-up with Dr. Mike Gip with labs, MD assessment and Faslodex/Xgeva injection on Monday.   2. Diarrhea: Started after last radiation. Decreased frequency of episodes with OTC imodium. Formed stool this morning.   3. Fever: last was several days ago. Unable to tell me specific day. As high as 101.0. Relieved with Tylenol.   4. Dehydration: d/t diarrhea. Hyponatremia present. See plan below.  Encourage patient to push fluids at home and to maintain hydration status.  Spoke at length about electrolyte replacement with Gatorade's/meal replacement such as Boost or Ensure's.   5. Weight Loss: 12 lbs since last visit. Met with dietician on 03/09/18 for "tips" for healthy weight loss. Possible some is from dehydration d/t diarrhea and some is intentional. We will continue to monitor her weight monthly.   Work up:  GI panel STAT. Formed stool sent for evaluation. C-Diff STAT. Not send- Formed stool.  Labs including CBC with diff, CMET, Magnesium. Hyponatremia Na + 131.  UA/Urine culture.  Pending Influenza swab. Pending   Plan: Give 1 L 0.9% Normal Saline.  Give 10 mg Decadron. Give 8 mg Zofran.  RX Lomotil 2.5-0.025 mg tablet QID PRN.  Follow-up: Spoke to patient 04/03/18: Patient feeling "better" then yesterday. She complains of "soreness" in lower abdomen(pelvis)/lower back that was not present on yesterdays exam but states this is "expected" from current/active radiation therapy .   Checked Urinalysis/urine culture to r/o infection. Pending at this time. UA is not overwhelmingly convincing for urinary tract infection.  Will wait on culture.  Radiation is focused on Lumbar Spine and her SI joints which may account for the soreness she is complaining of in the lower back and pelvic region.  If this worsens, patient instructed to call and let us know.  Also, diarrhea is a side effect of current radiation therapy and this was discussed in detail during their appointment.  Visit Diagnosis 1. Diarrhea, unspecified type   2. Dehydration   3. Nausea without vomiting   4. Weight loss   5. Fever, unspecified fever cause     Patient expressed understanding and was in agreement with this plan. She also understands that She can call clinic at any time with any questions, concerns, or complaints.   Greater than 50% was spent in counseling and coordination of care with this patient including but not limited to discussion of the relevant topics above (See A&P) including, but not limited to diagnosis and management of acute and chronic medical conditions.    Faythe Casa, AGNP-C [facility] [place]- [phone] Pager- [phone] 04/03/2018 12:08 PM

## 2018-04-02 NOTE — Telephone Encounter (Signed)
Patient called reporting that she is having diarrhea and nausea for several days and that she completed radiation therapy Friday and has appointment with D Corcoran Monday which she does not feel she should come for until she feels better. Asking what she can do to feel better

## 2018-04-06 ENCOUNTER — Inpatient Hospital Stay (HOSPITAL_BASED_OUTPATIENT_CLINIC_OR_DEPARTMENT_OTHER): Payer: Medicare Other | Admitting: Hematology and Oncology

## 2018-04-06 ENCOUNTER — Encounter: Payer: Self-pay | Admitting: Hematology and Oncology

## 2018-04-06 ENCOUNTER — Inpatient Hospital Stay: Payer: Medicare Other

## 2018-04-06 VITALS — BP 135/82 | HR 68 | Temp 97.8°F | Resp 18 | Ht 65.0 in | Wt 194.8 lb

## 2018-04-06 DIAGNOSIS — I10 Essential (primary) hypertension: Secondary | ICD-10-CM | POA: Diagnosis not present

## 2018-04-06 DIAGNOSIS — M8580 Other specified disorders of bone density and structure, unspecified site: Secondary | ICD-10-CM

## 2018-04-06 DIAGNOSIS — Z170 Estrogen receptor positive status [ER+]: Secondary | ICD-10-CM | POA: Diagnosis not present

## 2018-04-06 DIAGNOSIS — M85851 Other specified disorders of bone density and structure, right thigh: Secondary | ICD-10-CM

## 2018-04-06 DIAGNOSIS — Z5111 Encounter for antineoplastic chemotherapy: Secondary | ICD-10-CM | POA: Diagnosis not present

## 2018-04-06 DIAGNOSIS — Z85828 Personal history of other malignant neoplasm of skin: Secondary | ICD-10-CM

## 2018-04-06 DIAGNOSIS — C7951 Secondary malignant neoplasm of bone: Secondary | ICD-10-CM

## 2018-04-06 DIAGNOSIS — R110 Nausea: Secondary | ICD-10-CM | POA: Diagnosis not present

## 2018-04-06 DIAGNOSIS — C50412 Malignant neoplasm of upper-outer quadrant of left female breast: Secondary | ICD-10-CM

## 2018-04-06 DIAGNOSIS — E860 Dehydration: Secondary | ICD-10-CM | POA: Diagnosis not present

## 2018-04-06 DIAGNOSIS — Z7189 Other specified counseling: Secondary | ICD-10-CM

## 2018-04-06 LAB — CBC WITH DIFFERENTIAL/PLATELET
Basophils Absolute: 0 10*3/uL (ref 0–0.1)
Basophils Relative: 1 %
Eosinophils Absolute: 0.4 10*3/uL (ref 0–0.7)
Eosinophils Relative: 8 %
HCT: 36.1 % (ref 35.0–47.0)
Hemoglobin: 12.4 g/dL (ref 12.0–16.0)
Lymphocytes Relative: 10 %
Lymphs Abs: 0.5 10*3/uL — ABNORMAL LOW (ref 1.0–3.6)
MCH: 29.9 pg (ref 26.0–34.0)
MCHC: 34.4 g/dL (ref 32.0–36.0)
MCV: 86.9 fL (ref 80.0–100.0)
Monocytes Absolute: 0.6 10*3/uL (ref 0.2–0.9)
Monocytes Relative: 11 %
Neutro Abs: 3.6 10*3/uL (ref 1.4–6.5)
Neutrophils Relative %: 70 %
Platelets: 227 10*3/uL (ref 150–440)
RBC: 4.16 MIL/uL (ref 3.80–5.20)
RDW: 14.5 % (ref 11.5–14.5)
WBC: 5.2 10*3/uL (ref 3.6–11.0)

## 2018-04-06 LAB — COMPREHENSIVE METABOLIC PANEL
ALT: 13 U/L — ABNORMAL LOW (ref 14–54)
AST: 21 U/L (ref 15–41)
Albumin: 3.5 g/dL (ref 3.5–5.0)
Alkaline Phosphatase: 49 U/L (ref 38–126)
Anion gap: 10 (ref 5–15)
BUN: 20 mg/dL (ref 6–20)
CO2: 25 mmol/L (ref 22–32)
Calcium: 9.2 mg/dL (ref 8.9–10.3)
Chloride: 97 mmol/L — ABNORMAL LOW (ref 101–111)
Creatinine, Ser: 0.82 mg/dL (ref 0.44–1.00)
GFR calc Af Amer: >60 mL/min (ref >60)
GFR calc non Af Amer: >60 mL/min (ref >60)
Glucose, Bld: 108 mg/dL — ABNORMAL HIGH (ref 65–99)
Potassium: 3.6 mmol/L (ref 3.5–5.1)
Sodium: 132 mmol/L — ABNORMAL LOW (ref 135–145)
Total Bilirubin: 0.4 mg/dL (ref 0.3–1.2)
Total Protein: 6.3 g/dL — ABNORMAL LOW (ref 6.5–8.1)

## 2018-04-06 MED ORDER — FULVESTRANT 250 MG/5ML IM SOLN
500.0000 mg | Freq: Once | INTRAMUSCULAR | Status: AC
  Administered 2018-04-06: 500 mg via INTRAMUSCULAR
  Filled 2018-04-06: qty 10

## 2018-04-06 MED ORDER — DENOSUMAB 120 MG/1.7ML ~~LOC~~ SOLN
120.0000 mg | Freq: Once | SUBCUTANEOUS | Status: AC
  Administered 2018-04-06: 120 mg via SUBCUTANEOUS
  Filled 2018-04-06: qty 1.7

## 2018-04-06 NOTE — Progress Notes (Signed)
[facility] day:  04/06/2018    Chief Complaint: Kelli Williams is a 84 y.o. female with metastatic breast cancer who is seen for a 1 month assessment and continuation of Faslodex and Xgeva.  HPI:  The patient was last seen in the medical oncology clinic on 03/06/2018.  At that time, she was tolerating her treatment well.  She noted left hip pain (3 out of 10).  Lumbar sacral MRI revealed multi-focal metastatic disease.  We discussed consideration of radiation.  She received radiation to the lumbar spine from 03/04/2018 - 03/27/2018.  She was seen in the symptom management clinic on 04/02/2018 by Kelli Casa, NP for diarrhea x 6 days. Stool frequency was 5x/day.  Stool sample was collected and formed.  She received 1 liter NS, 10 mg Decadron, and 8 mg of ondansetron.  She received an Rx for Lomotil.  Etiology was felt secondary to radiation.  During the interim, she notes that her bowel movements are back to normal.  She did not use any medications to stop her diarrhea.  She notes that her hip hurts.  Knee is "not bad".  She has a "lump" where she had her last injection.   Past Medical History:  Diagnosis Date  . Anxiety   . Arthritis    BOTH FEET AND KNEES  . Breast cancer ([place]) 2000   left breast ca with lumpectomy and rad tx 5 yr tamoxifen/Dr Kelli Williams at [facility]  . Complication of anesthesia    WOKE UP DURING SURGERY FOR DEVIATED SEPTUM, KNEE REPLACEMENT AND BREAST LUMPECTOMY  . Dysrhythmia   . GERD (gastroesophageal reflux disease)   . Hypertension   . Neuropathic pain of both legs   . Neuropathy   . Pneumonia 2016  . Skin cancer of forehead   . Skin cancer of nose     Past Surgical History:  Procedure Laterality Date  . ABDOMINAL HYSTERECTOMY    . AXILLARY LYMPH NODE BIOPSY Left 07/10/2017   INVASIVE MAMMARY CARCINOMA WITH FOCAL LOBULAR FEATURES.   Marland Kitchen BREAST BIOPSY Bilateral 1960   benign  . BREAST LUMPECTOMY Left 2000   breast  ca with rad tx  . BREAST LUMPECTOMY Left 08/20/2017  . BREAST LUMPECTOMY Left 08/20/2017   Procedure: left breast wide excision;  Surgeon: Kelli Bellow, MD;  Location: [facility] ORS;  Service: General;  Laterality: Left;  . CATARACT EXTRACTION     left eye  . CHOLECYSTECTOMY    . JOINT REPLACEMENT    . NASAL SEPTUM SURGERY    . PARTIAL KNEE ARTHROPLASTY     right  . TOTAL VAGINAL HYSTERECTOMY      Family History  Problem Relation Age of Onset  . Heart disease Mother   . Heart attack Paternal Uncle   . Breast cancer Daughter 52       genetic negative  . Cancer Paternal Grandmother     Social History:  reports that she has never smoked. She has never used smokeless tobacco. She reports that she does not drink alcohol or use drugs.  The patient's husband died 2 years ago.  She was born outside of [place].  She previously worked as an Web designer.  She lives at the [facility].  She is independent except for the use of her walker. Patient has a daughter, Kelli Williams.  The patient is alone today.  Allergies:  Allergies  Allergen Reactions  . Amitriptyline Hives  . Amoxicillin  Diarrhea    . Ivp Dye [Iodinated Diagnostic Agents]   . Tape Other (See Comments)    [place] - please use paper tape.  . Sulfa Antibiotics Rash  . Sulfa Antibiotics Rash    Current Medications: Current Outpatient Medications  Medication Sig Dispense Refill  . acetaminophen (TYLENOL) 325 MG tablet Take 650 mg by mouth every 6 (six) hours as needed.    Marland Kitchen aspirin 81 MG tablet Take 1 tablet by mouth daily.    . Calcium-Magnesium 500-250 MG TABS Take 1 tablet by mouth daily.     . Coenzyme Q10 (CO Q-10) 100 MG CAPS Take by mouth daily.    Marland Kitchen gabapentin (NEURONTIN) 300 MG capsule TAKE 3 CAPSULES BY MOUTH  TWO TIMES DAILY 540 capsule 1  . lisinopril-hydrochlorothiazide (PRINZIDE,ZESTORETIC) 20-25 MG tablet TAKE 1 AND 1/2 TABLETS BY  MOUTH DAILY 135 tablet 3  . meloxicam (MOBIC) 15  MG tablet TAKE 1 TABLET BY MOUTH  DAILY 90 tablet 1  . Multiple Vitamins-Minerals (MULTIVITAMIN WITH MINERALS) tablet Take 1 tablet by mouth daily.    Marland Kitchen omeprazole (PRILOSEC) 20 MG capsule TAKE 1 CAPSULE BY MOUTH  DAILY 90 capsule 1  . traMADol (ULTRAM) 50 MG tablet One half to one tablet every 8 hours as needed pain 90 tablet 0  . diphenoxylate-atropine (LOMOTIL) 2.5-0.025 MG tablet Take 1 tablet by mouth 4 (four) times daily as needed for diarrhea or loose stools. (Patient not taking: Reported on 04/06/2018) 30 tablet 0  . nystatin (MYCOSTATIN/NYSTOP) powder Apply topically 4 (four) times daily. (Patient not taking: Reported on 04/06/2018) 15 g 3  . propranolol (INDERAL) 10 MG tablet Take 10 mg by mouth 3 (three) times daily.     No current facility-administered medications for this visit.     Review of Systems:  GENERAL:  Feels better.  No fevers, sweats.  Weight down 10 pounds. PERFORMANCE STATUS (ECOG):  2 HEENT:  No visual changes, runny nose, sore throat, mouth sores or tenderness. Lungs: No shortness of breath or cough.  No hemoptysis. Cardiac:  No chest pain, palpitations, orthopnea, or PND. GI:  Irritable bowel.  Diarrhea, resolved.  No nausea, vomiting, constipation, melena or hematochezia. GU:  No urgency, frequency, dysuria, or hematuria. Musculoskeletal:  Left hip and knee problems (chronic; see HPI).  Arthritis.  No muscle tenderness. Extremities:  No pain or swelling. Skin:  Lump at site of last injection.  No rashes or skin changes. Neuro:  No headache, numbness or weakness, balance or coordination issues. Endocrine:  No diabetes, thyroid issues, hot flashes or night sweats. Psych:  No mood changes, depression or anxiety. Pain:  No focal pain. Review of systems:  All other systems reviewed and found to be negative.   Physical Exam: Blood pressure 135/82, pulse 68, temperature 97.8 F (36.6 C), temperature source Tympanic, resp. rate 18, height '5\' 5"'$  (1.651 m), weight 194  lb 12.8 oz (88.4 kg), SpO2 98 %. GENERAL:  Elderly woman sitting comfortably in the exam room in no acute distress.  She has a rolling walker by her side. MENTAL STATUS:  Alert and oriented to person, place and time. HEAD:  Short styled gray hair.  Normocephalic, atraumatic, face symmetric, no Cushingoid features. EYES:  Hazel eyes.  Pupils equal round and reactive to light and accomodation.  No conjunctivitis or scleral icterus. ENT:  Oropharynx clear without lesion.  Tongue normal. Mucous membranes moist.  RESPIRATORY:  Clear to auscultation without rales, wheezes or rhonchi. CARDIOVASCULAR:  Regular rate and rhythm  without murmur, rub or gallop. BREAST:  Left breast with fibrocystic changes laterally.  No discrete masses.  No skin changes or nipple discharge.  ABDOMEN:  Soft, non-tender, with active bowel sounds, and no hepatosplenomegaly.  No masses. SKIN:  5-10 mm palpable nodule at old injection site (tiny hematoma) without overlying ecchymosis.  No rashes, ulcers or lesions. EXTREMITIES: No edema, no skin discoloration or tenderness.  No palpable cords. LYMPH NODES: Left axillary scarring.  No palpable cervical, supraclavicular, axillary or inguinal adenopathy  NEUROLOGICAL: Unremarkable. PSYCH:  Appropriate.     No visits with results within 3 Day(s) from this visit.  Latest known visit with results is:  Hospital Outpatient Visit on 07/10/2017  Component Date Value Ref Range Status  . SURGICAL PATHOLOGY 07/10/2017    Final-Edited                   Value:Surgical Pathology THIS IS AN ADDENDUM REPORT CASE: [id number] PATIENT: [facility] Surgical Pathology Report Addendum   Reason for Addendum #1:  Additional clinical/test information  SPECIMEN SUBMITTED: A. Axilla, left  CLINICAL HISTORY: Left axillary mass in a patient with history of prior ipsilateral breast cancer  PRE-OPERATIVE DIAGNOSIS: Metastatic disease vs second primary cancer  POST-OPERATIVE  DIAGNOSIS: None provided.     DIAGNOSIS: A.  LEFT AXILLA; ULTRASOUND-GUIDED NEEDLE CORE BIOPSY: - INVASIVE MAMMARY CARCINOMA WITH FOCAL LOBULAR FEATURES.  Size of invasive carcinoma:  1.1 cm in this sample Histologic grade of invasive carcinoma: Grade 2      Glandular/tubular differentiation score: 3      Nuclear pleomorphism score: 2      Mitotic rate score: 1  ER/PR/HER2: Immunohistochemistry will be performed, with reflex to [place] for HER2 2+ (equivocal) results. The results will be reported in an addendum.  The                          diagnosis was called to Kelli Williams of [facility] on 07/11/17 at 227 PM.   GROSS DESCRIPTION:  A. The specimen is received in a formalin-filled container labeled with the patient's name and left axillary mass.  Core pieces: multiple, 2.0 x 0.9 x 0.1 cm Comments: yellow to red lobulated fibrofatty, marked green  Entirely submitted in cassette(s): 1  Time/Date in fixative: placed in formalin at 1:50 PM on 07/10/17 cold ischemic time of less than 5 minutes Total fixation time: 6.5 hours    Final Diagnosis performed by Delorse Lek, MD.  Electronically signed 07/11/2017 2:29:14PM   The electronic signature indicates that the named Attending Pathologist has evaluated the specimen  Technical component performed at [facility], [address] [place], [place] [address] Lab: [phone] Dir: Darrick Penna. Evette Doffing, MD  Professional component performed at [facility], [facility], [place], [place], [place] [address] Lab: [phone]                          Dir: Dellia Nims. Rubinas, MD   Breast Biomarker Reporting Template  BREAST BIOMARKER TESTS Estrogen Receptor (ER) Status: POSITIVE, >90% nuclear staining      Average intensity of staining: Strong Progesterone Receptor (PgR) Status: POSITIVE, >90% nuclear staining      Average intensity of staining: Strong HER2 (by immunohistochemistry): EQUIVOCAL,  2+ Percentage of cells with uniform intense complete membrane staining: 0%  METHODS Cold Ischemia and Fixation Times: Meet requirements specified in latest version of the ASCO/CAP guidelines Testing Performed on  Block Number(s): A1 Fixative: Formalin Estrogen Receptor:  FDA cleared ([place])                    Primary Antibody:  SP1 Progesterone Receptor: FDA cleared (Ventana)                   Primary Antibody: 1E2 HER2 (by immunohistochemistry): FDA approved (DAKO)                             Primary Antibody: HercepTest  Immunohistochemistry controls worked appropriately. Slides were prepared by [facility] for [facility], [place], [place], and interpreted by Dr. Luana Shu.      Addendum #1 performed by Delorse Lek, MD.  Electronically signed 07/15/2017 1:36:03PM    Technical component performed at [facility], [address] [place], [place] [address] Lab: [phone] Dir: Darrick Penna. Evette Doffing, MD  Professional component performed at [facility], [facility], [place], [place], [place] [address] Lab: [phone] Dir: Dellia Nims. Rubinas, MD      Assessment:  Kelli Williams is a 84 y.o. female with metastatic breast cancer s/p left axillary biopsy on 07/10/2017.  Pathology revealed a 1.1 cm grade II invasive mammary carcinoma with focal lobular features.   Wide excision on 08/20/2017 revealed metastatic mammary carcinoma involving 1 of 4 axillary lymph nodes with extracapsular extension.  The superior medial margin was involved.  Tumor was ER + (> 90%), PR + (> 90%), and Her2/neu 2+ (FISH -).   [facility] genetic testing on 09/26/2017 revealed no pathogenic sequence variants or deletions/duplications.  BRCA1/2 testing on 09/26/2017 was negative.  She has a history of left breast cancer s/p wide excision and sentinel lymph node biopsy on 07/18/1999 at [facility].  Pathology revealed a 0.8 x 0.6 x 0.4 cm grade II invasive ductal  adenocarcinoma.  There was no lymphovascular invasion.  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  Margins were negative.  Tumor was ER + (50%) and PR + (90%).  Pathologic stage was T1bN0.  She received 6200 cGy from 08/21/1999 - 10/03/1999.  She completed 5 years of tamoxifen in 06/2004.    PET scan on 09/16/2017 revealed widespread scattered hypermetabolic lytic osseous metastases throughout the axial and proximal appendicular skeleton.  Bilateral proximal femoral skeletal metastases placed the patient at risk for pathologic hip fractures.  There was mild hypermetabolic right hilar lymphadenopathy.  Plain films of the bilateral femurs on 09/23/2017 revealed no pathologic fracture. The LEFT view showed lytic subtrochanteric metastasis that was previously seen on PET scan.   Lumbar spine MRI on 02/11/2018 revealed multifocal lumbosacral vertebral body signal abnormalities consistent with osseous metastatic disease. The largest lesion was at L1.  There was no pathologic compression fracture. There was multilevel moderate-to-severe neural foraminal stenosis, which had progressed since the prior study.  CA27.29 has been followed:  14.7 on 02/05/2017, 19.1 on 02/06/2012, 172.2 on 09/04/2017, 144.9 on 11/06/2017, 73.4 on 02/05/2018, 63.3 on 03/06/2018, and 47.2 on 04/06/2018.  She received a palliative course of radiation (3000 cGy in 10 fractions) to the left hip from 10/14/2017 - 10/27/2017.  She received a course of palliative radiation the the lumbar spine and SI joints from 03/04/2018 - 03/27/2018.  She began Faslodex on 09/26/2017 (last 03/06/2017).  She began monthly Xgeva on 09/26/2017 (  last 03/06/2018).    Bone density on 09/01/2017 revealed osteopenia with a T-score of -1.9 in the right femoral neck and left forearm. She is on calcium and vitamin D.  Symptomatically, she denies pain today.  Weight is down (intentional).  Exam reveals improved left breast and axillary changes.  Plan: 1.   Labs today:  CBC with diff, CMP, CA27.29. 2.  Discuss completion of radiation. 3.  Discuss interval symptom management clinic visit.  Radiation induced diarrhea has resolved. 4.  Discuss nutrition consult for desired weight loss.  Patient feels comfortable with plan. 5.  Discuss declining CA27.29 and improvement in exam.  She is responding to therapy.  Patient wishes to continue Faslodex. 6.  Faslodex today. 7.  Xgeva today. 8.  Continue supplemental calcium 1200 mg and vitamin D 800 IU daily. 9.  RTC in 1 month for MD assessment, labs (CBC with diff, CMP, CA27.29), Faslodex, and Xgeva.   Lequita Asal, MD  04/06/2018, 4:35 PM

## 2018-04-07 ENCOUNTER — Other Ambulatory Visit: Payer: Self-pay | Admitting: *Deleted

## 2018-04-07 ENCOUNTER — Telehealth: Payer: Self-pay | Admitting: *Deleted

## 2018-04-07 DIAGNOSIS — Z853 Personal history of malignant neoplasm of breast: Secondary | ICD-10-CM

## 2018-04-07 LAB — CA 27.29 (SERIAL MONITOR): CA 27.29: 47.2 U/mL — ABNORMAL HIGH (ref 0.0–38.6)

## 2018-04-07 NOTE — Telephone Encounter (Signed)
  Please follow-up again with the patient.  Lip swelling is different from a cold sore/fever blister.  She has received Xgeva monthly for awhile.  Thanks,  M

## 2018-04-07 NOTE — Telephone Encounter (Signed)
Called patient regarding lab results and she stated to me that she had an Xgeva injection yesterday.  She had read on the side effects that it could cause your lips to swell.  States she feels like her lips are swollen today (like a fever blister is coming up).

## 2018-04-07 NOTE — Telephone Encounter (Signed)
-----   Message from Lequita Asal, MD sent at 04/07/2018 11:49 AM EDT ----- Regarding: Please call patient   Tumor marker down again!  M  ----- Message ----- From: Interface, Lab In [place] Sent: 04/06/2018   3:11 PM To: Lequita Asal, MD

## 2018-04-07 NOTE — Telephone Encounter (Signed)
Called patient to inform her that her tumor marker is down again.  Patient very happy with results.

## 2018-04-08 NOTE — Telephone Encounter (Signed)
Patient not that concerned so will not make appointment at this time

## 2018-04-08 NOTE — Telephone Encounter (Signed)
[  04/08/2018 3:49 PM]  Black, Rodena Piety:   Spoke to Dr. Loletha Grayer. about H. Brougham.  She says if the patient is concerned she can come in and see Sonia Baller.  She does not feel it is her medication though.

## 2018-04-08 NOTE — Telephone Encounter (Signed)
Patient states her lips are sore a a little thicker than usual, not bad, but a strange feeling both upper and lower lips. She does report that she had burned her mouth on hot coffee and it could be from that. She is not too concerned about it this morning

## 2018-05-06 ENCOUNTER — Ambulatory Visit: Payer: Medicare Other | Admitting: Radiation Oncology

## 2018-05-07 ENCOUNTER — Inpatient Hospital Stay: Payer: Medicare Other | Attending: Hematology and Oncology

## 2018-05-07 ENCOUNTER — Inpatient Hospital Stay: Payer: Medicare Other

## 2018-05-07 ENCOUNTER — Inpatient Hospital Stay (HOSPITAL_BASED_OUTPATIENT_CLINIC_OR_DEPARTMENT_OTHER): Payer: Medicare Other | Admitting: Hematology and Oncology

## 2018-05-07 ENCOUNTER — Encounter: Payer: Self-pay | Admitting: Hematology and Oncology

## 2018-05-07 VITALS — BP 140/70 | HR 56 | Temp 97.7°F | Resp 18 | Ht 65.0 in | Wt 200.9 lb

## 2018-05-07 DIAGNOSIS — Z7189 Other specified counseling: Secondary | ICD-10-CM

## 2018-05-07 DIAGNOSIS — M8580 Other specified disorders of bone density and structure, unspecified site: Secondary | ICD-10-CM | POA: Diagnosis not present

## 2018-05-07 DIAGNOSIS — C50412 Malignant neoplasm of upper-outer quadrant of left female breast: Secondary | ICD-10-CM | POA: Diagnosis not present

## 2018-05-07 DIAGNOSIS — Z85828 Personal history of other malignant neoplasm of skin: Secondary | ICD-10-CM | POA: Diagnosis not present

## 2018-05-07 DIAGNOSIS — C7951 Secondary malignant neoplasm of bone: Secondary | ICD-10-CM

## 2018-05-07 DIAGNOSIS — M545 Low back pain: Secondary | ICD-10-CM

## 2018-05-07 DIAGNOSIS — M85851 Other specified disorders of bone density and structure, right thigh: Secondary | ICD-10-CM

## 2018-05-07 DIAGNOSIS — Z5111 Encounter for antineoplastic chemotherapy: Secondary | ICD-10-CM | POA: Insufficient documentation

## 2018-05-07 DIAGNOSIS — I10 Essential (primary) hypertension: Secondary | ICD-10-CM | POA: Diagnosis not present

## 2018-05-07 DIAGNOSIS — Z170 Estrogen receptor positive status [ER+]: Secondary | ICD-10-CM

## 2018-05-07 DIAGNOSIS — Z853 Personal history of malignant neoplasm of breast: Secondary | ICD-10-CM

## 2018-05-07 LAB — CBC WITH DIFFERENTIAL/PLATELET
Basophils Absolute: 0.1 10*3/uL (ref 0–0.1)
Basophils Relative: 1 %
Eosinophils Absolute: 0.2 10*3/uL (ref 0–0.7)
Eosinophils Relative: 4 %
HCT: 35.4 % (ref 35.0–47.0)
Hemoglobin: 12.2 g/dL (ref 12.0–16.0)
Lymphocytes Relative: 24 %
Lymphs Abs: 1.3 10*3/uL (ref 1.0–3.6)
MCH: 30.3 pg (ref 26.0–34.0)
MCHC: 34.6 g/dL (ref 32.0–36.0)
MCV: 87.6 fL (ref 80.0–100.0)
Monocytes Absolute: 0.5 10*3/uL (ref 0.2–0.9)
Monocytes Relative: 9 %
Neutro Abs: 3.3 10*3/uL (ref 1.4–6.5)
Neutrophils Relative %: 62 %
Platelets: 218 10*3/uL (ref 150–440)
RBC: 4.04 MIL/uL (ref 3.80–5.20)
RDW: 15.4 % — ABNORMAL HIGH (ref 11.5–14.5)
WBC: 5.3 10*3/uL (ref 3.6–11.0)

## 2018-05-07 LAB — COMPREHENSIVE METABOLIC PANEL
ALT: 14 U/L (ref 14–54)
AST: 24 U/L (ref 15–41)
Albumin: 4 g/dL (ref 3.5–5.0)
Alkaline Phosphatase: 58 U/L (ref 38–126)
Anion gap: 9 (ref 5–15)
BUN: 20 mg/dL (ref 6–20)
CO2: 27 mmol/L (ref 22–32)
Calcium: 9.3 mg/dL (ref 8.9–10.3)
Chloride: 100 mmol/L — ABNORMAL LOW (ref 101–111)
Creatinine, Ser: 0.8 mg/dL (ref 0.44–1.00)
GFR calc Af Amer: >60 mL/min (ref >60)
GFR calc non Af Amer: >60 mL/min (ref >60)
Glucose, Bld: 100 mg/dL — ABNORMAL HIGH (ref 65–99)
Potassium: 3.6 mmol/L (ref 3.5–5.1)
Sodium: 136 mmol/L (ref 135–145)
Total Bilirubin: 0.4 mg/dL (ref 0.3–1.2)
Total Protein: 7.3 g/dL (ref 6.5–8.1)

## 2018-05-07 MED ORDER — FULVESTRANT 250 MG/5ML IM SOLN
500.0000 mg | Freq: Once | INTRAMUSCULAR | Status: AC
  Administered 2018-05-07: 500 mg via INTRAMUSCULAR
  Filled 2018-05-07: qty 10

## 2018-05-07 MED ORDER — DENOSUMAB 120 MG/1.7ML ~~LOC~~ SOLN
120.0000 mg | Freq: Once | SUBCUTANEOUS | Status: AC
  Administered 2018-05-07: 120 mg via SUBCUTANEOUS
  Filled 2018-05-07: qty 1.7

## 2018-05-07 NOTE — Progress Notes (Signed)
[facility] day:  05/07/2018     Chief Complaint: Kelli Williams is a 84 y.o. female with metastatic breast cancer who is seen for a 1 month assessment and continuation of Faslodex and Xgeva.  HPI:  The patient was last seen in the medical oncology clinic on 04/06/2018.  At that time, she denied pain.  Weight was down (intentional).  Exam revealed improved left breast and axillary changes.  CA27.29 was 47.2 (improved).  She received Faslodex and Xgeva.  During the interim, patient is doing well overall. She notes that she has continue pain in her lower back. The pain in her hips has improved. Patient denies B symptoms and interval infections.  Patient does not verbalize any concerns with regards to her breasts today. Patient performs monthly self breast examinations as recommended.   Patient is eating well. Weight is down 3 pounds. Patient notes that she has fluids in her bilateral lower extremities. She denies orthopnea.   Patient denies pain in the clinic today.    Past Medical History:  Diagnosis Date  . Anxiety   . Arthritis    BOTH FEET AND KNEES  . Breast cancer ([place]) 2000   left breast ca with lumpectomy and rad tx 5 yr tamoxifen/Dr Jeannine Kitten at [facility]  . Complication of anesthesia    WOKE UP DURING SURGERY FOR DEVIATED SEPTUM, KNEE REPLACEMENT AND BREAST LUMPECTOMY  . Dysrhythmia   . GERD (gastroesophageal reflux disease)   . Hypertension   . Neuropathic pain of both legs   . Neuropathy   . Pneumonia 2016  . Skin cancer of forehead   . Skin cancer of nose     Past Surgical History:  Procedure Laterality Date  . ABDOMINAL HYSTERECTOMY    . AXILLARY LYMPH NODE BIOPSY Left 07/10/2017   INVASIVE MAMMARY CARCINOMA WITH FOCAL LOBULAR FEATURES.   Marland Kitchen BREAST BIOPSY Bilateral 1960   benign  . BREAST LUMPECTOMY Left 2000   breast ca with rad tx  . BREAST LUMPECTOMY Left 08/20/2017  . BREAST LUMPECTOMY Left 08/20/2017   Procedure: left  breast wide excision;  Surgeon: Robert Bellow, MD;  Location: [facility] ORS;  Service: General;  Laterality: Left;  . CATARACT EXTRACTION     left eye  . CHOLECYSTECTOMY    . JOINT REPLACEMENT    . NASAL SEPTUM SURGERY    . PARTIAL KNEE ARTHROPLASTY     right  . TOTAL VAGINAL HYSTERECTOMY      Family History  Problem Relation Age of Onset  . Heart disease Mother   . Heart attack Paternal Uncle   . Breast cancer Daughter 69       genetic negative  . Cancer Paternal Grandmother     Social History:  reports that she has never smoked. She has never used smokeless tobacco. She reports that she does not drink alcohol or use drugs.  The patient's husband died 2 years ago.  She was born outside of [place].  She previously worked as an Web designer.  She lives at the [facility] at [place].  She is independent except for the use of her walker. Patient has a daughter, Cecilie Heidel.  The patient is alone today.  Allergies:  Allergies  Allergen Reactions  . Amitriptyline Hives  . Amoxicillin     Diarrhea    . Ivp Dye [Iodinated Diagnostic Agents]   . Tape Other (See Comments)    [place] - please use paper tape.  Marland Kitchen  Sulfa Antibiotics Rash  . Sulfa Antibiotics Rash    Current Medications: Current Outpatient Medications  Medication Sig Dispense Refill  . aspirin 81 MG tablet Take 1 tablet by mouth daily.    . Calcium-Magnesium 500-250 MG TABS Take 1 tablet by mouth daily.     . Coenzyme Q10 (CO Q-10) 100 MG CAPS Take by mouth daily.    Marland Kitchen gabapentin (NEURONTIN) 300 MG capsule TAKE 3 CAPSULES BY MOUTH  TWO TIMES DAILY 540 capsule 1  . lisinopril-hydrochlorothiazide (PRINZIDE,ZESTORETIC) 20-25 MG tablet TAKE 1 AND 1/2 TABLETS BY  MOUTH DAILY 135 tablet 3  . meloxicam (MOBIC) 15 MG tablet TAKE 1 TABLET BY MOUTH  DAILY 90 tablet 1  . Multiple Vitamins-Minerals (MULTIVITAMIN WITH MINERALS) tablet Take 1 tablet by mouth daily.    Marland Kitchen omeprazole (PRILOSEC) 20 MG capsule TAKE 1 CAPSULE  BY MOUTH  DAILY 90 capsule 1  . propranolol (INDERAL) 10 MG tablet Take 10 mg by mouth 3 (three) times daily.    . traMADol (ULTRAM) 50 MG tablet One half to one tablet every 8 hours as needed pain 90 tablet 0  . acetaminophen (TYLENOL) 325 MG tablet Take 650 mg by mouth every 6 (six) hours as needed.    . diphenoxylate-atropine (LOMOTIL) 2.5-0.025 MG tablet Take 1 tablet by mouth 4 (four) times daily as needed for diarrhea or loose stools. (Patient not taking: Reported on 05/07/2018) 30 tablet 0  . nystatin (MYCOSTATIN/NYSTOP) powder Apply topically 4 (four) times daily. (Patient not taking: Reported on 04/06/2018) 15 g 3   No current facility-administered medications for this visit.     Review of Systems  Constitutional: Positive for weight loss (down 3 pounds). Negative for diaphoresis, fever and malaise/fatigue.  HENT: Negative.  Negative for congestion, nosebleeds and sore throat.   Eyes: Negative.  Negative for blurred vision, double vision, pain and redness.  Respiratory: Negative for cough, hemoptysis, sputum production and shortness of breath.   Cardiovascular: Negative for chest pain, palpitations, orthopnea, leg swelling and PND.  Gastrointestinal: Negative for abdominal pain, blood in stool, constipation, diarrhea, melena, nausea and vomiting.       Irritable bowel  Genitourinary: Negative for dysuria, frequency, hematuria and urgency.  Musculoskeletal: Positive for back pain and joint pain (LEFT hip pain; bilateral knee pain). Negative for falls and myalgias.  Skin: Negative for itching and rash.  Neurological: Negative for dizziness, tremors, weakness and headaches.  Endo/Heme/Allergies: Does not bruise/bleed easily.  Psychiatric/Behavioral: Negative for depression, memory loss and suicidal ideas. The patient is not nervous/anxious and does not have insomnia.   All other systems reviewed and are negative.  Performance status (ECOG): 2 - Symptomatic, <50% confined to  bed  Physical Exam: Blood pressure 140/70, pulse (!) 56, temperature 97.7 F (36.5 C), temperature source Tympanic, resp. rate 18, height '5\' 5"'$  (1.651 m), weight 200 lb 14.4 oz (91.1 kg), SpO2 99 %. GENERAL:  Elderly woman sitting comfortably in the exam room in no acute distress.  She has a rolling walker at her side. MENTAL STATUS:  Alert and oriented to person, place and time. HEAD:  Short styled gray hair.  Normocephalic, atraumatic, face symmetric, no Cushingoid features. EYES:   Hazel eyes.  Pupils equal round and reactive to light and accomodation.  No conjunctivitis or scleral icterus. ENT:  Oropharynx clear without lesion.  Tongue normal. Mucous membranes moist.  RESPIRATORY:  Clear to auscultation without rales, wheezes or rhonchi. CARDIOVASCULAR:  Regular rate and rhythm without murmur, rub or gallop. ABDOMEN:  Soft, non-tender, with active bowel sounds, and no hepatosplenomegaly.  No masses. SKIN:  No rashes, ulcers or lesions. EXTREMITIES: No edema, no skin discoloration or tenderness.  No palpable cords. LYMPH NODES: Left axillary scarring.  No palpable cervical, supraclavicular, axillary or inguinal adenopathy  NEUROLOGICAL: Unremarkable. PSYCH:  Appropriate.    No visits with results within 3 Day(s) from this visit.  Latest known visit with results is:  Hospital Outpatient Visit on 07/10/2017  Component Date Value Ref Range Status  . SURGICAL PATHOLOGY 07/10/2017    Final-Edited                   Value:Surgical Pathology THIS IS AN ADDENDUM REPORT CASE: [id number] PATIENT: Kelli Williams Surgical Pathology Report Addendum   Reason for Addendum #1:  Additional clinical/test information  SPECIMEN SUBMITTED: A. Axilla, left  CLINICAL HISTORY: Left axillary mass in a patient with history of prior ipsilateral breast cancer  PRE-OPERATIVE DIAGNOSIS: Metastatic disease vs second primary cancer  POST-OPERATIVE DIAGNOSIS: None provided.     DIAGNOSIS: A.   LEFT AXILLA; ULTRASOUND-GUIDED NEEDLE CORE BIOPSY: - INVASIVE MAMMARY CARCINOMA WITH FOCAL LOBULAR FEATURES.  Size of invasive carcinoma:  1.1 cm in this sample Histologic grade of invasive carcinoma: Grade 2      Glandular/tubular differentiation score: 3      Nuclear pleomorphism score: 2      Mitotic rate score: 1  ER/PR/HER2: Immunohistochemistry will be performed, with reflex to [facility] for HER2 2+ (equivocal) results. The results will be reported in an addendum.  The                          diagnosis was called to Eino Farber of [facility] on 07/11/17 at 227 PM.   GROSS DESCRIPTION:  A. The specimen is received in a formalin-filled container labeled with the patient's name and left axillary mass.  Core pieces: multiple, 2.0 x 0.9 x 0.1 cm Comments: yellow to red lobulated fibrofatty, marked green  Entirely submitted in cassette(s): 1  Time/Date in fixative: placed in formalin at 1:50 PM on 07/10/17 cold ischemic time of less than 5 minutes Total fixation time: 6.5 hours    Final Diagnosis performed by Delorse Lek, MD.  Electronically signed 07/11/2017 2:29:14PM   The electronic signature indicates that the named Attending Pathologist has evaluated the specimen  Technical component performed at [facility], [address] [place], [place] [address] Lab: [phone] Dir: Darrick Penna. Evette Doffing, MD  Professional component performed at [facility], [facility], [place], [place], [place] [address] Lab: [phone]                          Dir: Dellia Nims. Rubinas, MD   Breast Biomarker Reporting Template  BREAST BIOMARKER TESTS Estrogen Receptor (ER) Status: POSITIVE, >90% nuclear staining      Average intensity of staining: Strong Progesterone Receptor (PgR) Status: POSITIVE, >90% nuclear staining      Average intensity of staining: Strong HER2 (by immunohistochemistry): EQUIVOCAL, 2+ Percentage of cells with uniform intense  complete membrane staining: 0%  METHODS Cold Ischemia and Fixation Times: Meet requirements specified in latest version of the ASCO/CAP guidelines Testing Performed on Block Number(s): A1 Fixative: Formalin Estrogen Receptor:  FDA cleared ([place])                    Primary Antibody:  SP1 Progesterone Receptor: FDA cleared ([place]entana)  Primary Antibody: 1E2 HER2 (by immunohistochemistry): FDA approved (DAKO)                             Primary Antibody: HercepTest  Immunohistochemistry controls worked appropriately. Slides were prepared by [facility] for [facility], [place], [place], and interpreted by Dr. Luana Shu.      Addendum #1 performed by Delorse Lek, MD.  Electronically signed 07/15/2017 1:36:03PM    Technical component performed at [facility], [address] [place], [place] [address] Lab: [phone] Dir: Darrick Penna. Evette Doffing, MD  Professional component performed at [facility], [facility], [place], [place],  [address] Lab: [phone] Dir: Dellia Nims. Rubinas, MD      Assessment:  Kelli Williams is a 84 y.o. female with metastatic breast cancer s/p left axillary biopsy on 07/10/2017.  Pathology revealed a 1.1 cm grade II invasive mammary carcinoma with focal lobular features.   Wide excision on 08/20/2017 revealed metastatic mammary carcinoma involving 1 of 4 axillary lymph nodes with extracapsular extension.  The superior medial margin was involved.  Tumor was ER + (> 90%), PR + (> 90%), and Her2/neu 2+ (FISH -).   [facility] genetic testing on 09/26/2017 revealed no pathogenic sequence variants or deletions/duplications.  BRCA1/2 testing on 09/26/2017 was negative.  She has a history of left breast cancer s/p wide excision and sentinel lymph node biopsy on 07/18/1999 at [facility].  Pathology revealed a 0.8 x 0.6 x 0.4 cm grade II invasive ductal adenocarcinoma.  There was no lymphovascular  invasion.  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  Margins were negative.  Tumor was ER + (50%) and PR + (90%).  Pathologic stage was T1bN0.  She received 6200 cGy from 08/21/1999 - 10/03/1999.  She completed 5 years of tamoxifen in 06/2004.    PET scan on 09/16/2017 revealed widespread scattered hypermetabolic lytic osseous metastases throughout the axial and proximal appendicular skeleton.  Bilateral proximal femoral skeletal metastases placed the patient at risk for pathologic hip fractures.  There was mild hypermetabolic right hilar lymphadenopathy.  Plain films of the bilateral femurs on 09/23/2017 revealed no pathologic fracture. The LEFT view showed lytic subtrochanteric metastasis that was previously seen on PET scan.   Lumbar spine MRI on 02/11/2018 revealed multifocal lumbosacral vertebral body signal abnormalities consistent with osseous metastatic disease. The largest lesion was at L1.  There was no pathologic compression fracture. There was multi-level moderate to severe neural foraminal stenosis, which had progressed since the prior study.  CA27.29 has been followed:  14.7 on 02/05/2017, 19.1 on 02/06/2012, 172.2 on 09/04/2017, 144.9 on 11/06/2017, 73.4 on 02/05/2018, 63.3 on 03/06/2018, and 47.2 on 04/06/2018.  She received a palliative course of radiation (3000 cGy in 10 fractions) to the left hip from 10/14/2017 - 10/27/2017.  She received a course of palliative radiation the the lumbar spine and SI joints from 03/04/2018 - 03/27/2018.  She began Faslodex on 09/26/2017 (last 04/06/2017).  She began monthly Xgeva on 09/26/2017 (last 04/06/2018).    Bone density on 09/01/2017 revealed osteopenia with a T-score of -1.9 in the right femoral neck and left forearm. She is on calcium and vitamin D.  Symptomatically, she denies pain today.  She notes that her lower back still bothers her.  Weight is down (intentional). She denies B  symptoms and infections.  Exam is stable.    Plan: 1. Labs today:  CBC with diff, CMP, CA27.29. 2. Discuss mammogram. Patient states, "They are uncomfortable. I would rather not have one".  3. Discuss nutrition consult for desired weight loss.  Patient feels comfortable with plan. 4. Discuss declining CA27.29 and improvement in exam.  She is responding to therapy.  Patient wishes to continue Faslodex. 5. Faslodex today. 6. Xgeva today. 7. Continue supplemental calcium 1200 mg and vitamin D 800 IU daily. 8. Schedule PET scan on 06/01/2018. 9. RTC in 1 month for MD assessment, labs (CBC with diff, CMP, CA27.29), review of imaging, Faslodex and Xgeva.   Honor Loh, NP  05/07/2018, 3:46 PM   I saw and evaluated the patient, participating in the key portions of the service and reviewing pertinent diagnostic studies and records.  I reviewed the nurse practitioner's note and agree with the findings and the plan.  The assessment and plan were discussed with the patient.  Additional diagnostic studies of PET scan are needed to assess response to therapy and would change the clinical management. Multiple questions were asked by the patient and answered.   Nolon Stalls, MD 05/07/2018,3:46 PM

## 2018-05-07 NOTE — Progress Notes (Signed)
No new changes noted today 

## 2018-05-08 ENCOUNTER — Telehealth: Payer: Self-pay | Admitting: *Deleted

## 2018-05-08 LAB — CANCER ANTIGEN 27.29: CA 27.29: 42.4 U/mL — ABNORMAL HIGH (ref 0.0–38.6)

## 2018-05-08 NOTE — Telephone Encounter (Signed)
Called patient to inform her that her tumor marker is down.

## 2018-05-08 NOTE — Telephone Encounter (Signed)
-----   Message from Lequita Asal, MD sent at 05/08/2018 12:23 PM EDT ----- Regarding: Please call patient  Tumor marker continues to go down.  M  ----- Message ----- From: Interface, Lab In [place] Sent: 05/07/2018   2:54 PM To: Lequita Asal, MD

## 2018-05-19 ENCOUNTER — Telehealth: Payer: Self-pay | Admitting: *Deleted

## 2018-05-19 NOTE — Telephone Encounter (Signed)
Patient family Kelli Williams called to report that a "cyst on her hand that is attached to the bone has ruptured" asking for a return call to know what to do for it [phone]

## 2018-05-19 NOTE — Telephone Encounter (Signed)
Kelli Williams informed to contact PCP for this issue, She replied, "Oh OK, Thank you"

## 2018-05-19 NOTE — Telephone Encounter (Signed)
I would have her contact her PCP. This issue is unrelated to her breast cancer.

## 2018-05-20 ENCOUNTER — Telehealth: Payer: Self-pay

## 2018-05-20 NOTE — Telephone Encounter (Signed)
Patient called concern of ruptured cyst on her knuckles/ fingers. She was wanting to get abx for infection. Patient contacted her oncologist who advised her to call PCP to get treatment. Spoke with patient who stated she shook someone's hand at her dentist office which broke the cyst. She is had some drainage at that time. She claims she is not having any pain. Advise patient to she may need to be seen. If not she needs to keep her hands cleaned and keep hand cover. She will give [place] a call in a few days.

## 2018-05-20 NOTE — Telephone Encounter (Signed)
Advise is appropriate.  Pt will call to be seen if needed.

## 2018-06-01 ENCOUNTER — Ambulatory Visit
Admission: RE | Admit: 2018-06-01 | Discharge: 2018-06-01 | Disposition: A | Discharge status: Home / Self Care | Payer: Medicare Other | Source: Ambulatory Visit | Attending: Urgent Care | Admitting: Urgent Care

## 2018-06-01 ENCOUNTER — Ambulatory Visit: Payer: Medicare Other

## 2018-06-01 DIAGNOSIS — Z170 Estrogen receptor positive status [ER+]: Secondary | ICD-10-CM | POA: Diagnosis not present

## 2018-06-01 DIAGNOSIS — C7951 Secondary malignant neoplasm of bone: Secondary | ICD-10-CM | POA: Diagnosis not present

## 2018-06-01 DIAGNOSIS — K449 Diaphragmatic hernia without obstruction or gangrene: Secondary | ICD-10-CM | POA: Insufficient documentation

## 2018-06-01 DIAGNOSIS — I517 Cardiomegaly: Secondary | ICD-10-CM | POA: Insufficient documentation

## 2018-06-01 DIAGNOSIS — C50912 Malignant neoplasm of unspecified site of left female breast: Secondary | ICD-10-CM | POA: Diagnosis not present

## 2018-06-01 DIAGNOSIS — C50412 Malignant neoplasm of upper-outer quadrant of left female breast: Secondary | ICD-10-CM | POA: Diagnosis not present

## 2018-06-01 DIAGNOSIS — I2510 Atherosclerotic heart disease of native coronary artery without angina pectoris: Secondary | ICD-10-CM | POA: Insufficient documentation

## 2018-06-01 DIAGNOSIS — K5730 Diverticulosis of large intestine without perforation or abscess without bleeding: Secondary | ICD-10-CM | POA: Insufficient documentation

## 2018-06-01 LAB — GLUCOSE, CAPILLARY: Glucose-Capillary: 103 mg/dL — ABNORMAL HIGH (ref 65–99)

## 2018-06-01 MED ORDER — FLUDEOXYGLUCOSE F - 18 (FDG) INJECTION
10.4000 | Freq: Once | INTRAVENOUS | Status: AC | PRN
Start: 2018-06-01 — End: 2018-06-01
  Administered 2018-06-01: 10.8 via INTRAVENOUS

## 2018-06-07 NOTE — Progress Notes (Signed)
Kelli Williams day:  06/08/2018   Chief Complaint: Kelli Williams is a 84 y.o. female with metastatic breast cancer who is seen for a 1 month assessment and continuation of Faslodex and Xgeva.  HPI:  The patient was last seen in the medical oncology Williams on 05/07/2018.  At that time, patient complained of pain in her lower back.  Pain in her hips had improved.  She denied B symptoms and recent infections.  She had no breast complaints.  Exam was stable. CA27.29 had trended down to 42.4.  Patient's daughter contact the Williams on 05/19/2018 to report that the patient had shaked someone's hand at church, which in turn caused a cyst to her finger to rupture. Daughter was requesting antibiotic coverage to prevent infection.  Patient was referred to her PCP for further evaluation and treatment as needed.  In review of the notes, there is no indication to suggest that treatment was rendered.  PET imaging on 06/01/2018 demonstrated marked improvement in the osseous metastatic disease.  Previously demonstrated areas of abnormally high metabolic activity have resolved, with the exception of a very faint area of residual activity (SUV 2.6; previously 11.5) in the LEFT subtrochanteric region. Previous LEFT axillary fluid collection now shows a 1.8 x 1.2 cm scarlike density with low-grade activity (SUV 2.7). Incidental findings include a small hiatal hernia and diverticulosis of the sigmoid colon.   In the interim, she has done well.  She voices no breast concerns.  She denies any bone pain.  She comments that her knees hurt.  She has discomfort in her right lower back where she received radiation.  She has an appointment with cardiology on 06/30/2018.   Past Medical History:  Diagnosis Date  . Anxiety   . Arthritis    BOTH FEET AND KNEES  . Breast cancer (Kelli Williams) 2000   left breast ca with lumpectomy and rad tx 5 yr tamoxifen/Dr Kelli Williams at [facility]  . Complication of  anesthesia    WOKE UP DURING SURGERY FOR DEVIATED SEPTUM, KNEE REPLACEMENT AND BREAST LUMPECTOMY  . Dysrhythmia   . GERD (gastroesophageal reflux disease)   . Hypertension   . Neuropathic pain of both legs   . Neuropathy   . Pneumonia 2016  . Skin cancer of forehead   . Skin cancer of nose     Past Surgical History:  Procedure Laterality Date  . ABDOMINAL HYSTERECTOMY    . AXILLARY LYMPH NODE BIOPSY Left 07/10/2017   INVASIVE MAMMARY CARCINOMA WITH FOCAL LOBULAR FEATURES.   Marland Kitchen BREAST BIOPSY Bilateral 1960   benign  . BREAST LUMPECTOMY Left 2000   breast ca with rad tx  . BREAST LUMPECTOMY Left 08/20/2017  . BREAST LUMPECTOMY Left 08/20/2017   Procedure: left breast wide excision;  Surgeon: Kelli Bellow, MD;  Location: [facility] ORS;  Service: General;  Laterality: Left;  . CATARACT EXTRACTION     left eye  . CHOLECYSTECTOMY    . JOINT REPLACEMENT    . NASAL SEPTUM SURGERY    . PARTIAL KNEE ARTHROPLASTY     right  . TOTAL VAGINAL HYSTERECTOMY      Family History  Problem Relation Age of Onset  . Heart disease Mother   . Heart attack Paternal Uncle   . Breast cancer Daughter 2       genetic negative  . Cancer Paternal Grandmother     Social History:  reports that she has never smoked. She has never used  smokeless tobacco. She reports that she does not drink alcohol or use drugs.  The patient's husband died 2 years ago.  She was born outside of [place].  She previously worked as an Web designer.  She lives at the [facility] at [place].  She is independent except for the use of her walker. Patient has a daughter, Kelli Williams.  The patient is accompanied by her daughter today.  Allergies:  Allergies  Allergen Reactions  . Amitriptyline Hives  . Amoxicillin     Diarrhea    . Ivp Dye [Iodinated Diagnostic Agents]   . Tape Other (See Comments)    [place]ps - please use paper tape.  . Sulfa Antibiotics Rash  . Sulfa Antibiotics Rash    Current  Medications: Current Outpatient Medications  Medication Sig Dispense Refill  . acetaminophen (TYLENOL) 325 MG tablet Take 650 mg by mouth every 6 (six) hours as needed.    Marland Kitchen aspirin 81 MG tablet Take 1 tablet by mouth daily.    . Calcium-Magnesium 500-250 MG TABS Take 1 tablet by mouth daily.     . Coenzyme Q10 (CO Q-10) 100 MG CAPS Take by mouth daily.    Marland Kitchen gabapentin (NEURONTIN) 300 MG capsule TAKE 3 CAPSULES BY MOUTH  TWO TIMES DAILY 540 capsule 1  . lisinopril-hydrochlorothiazide (PRINZIDE,ZESTORETIC) 20-25 MG tablet TAKE 1 AND 1/2 TABLETS BY  MOUTH DAILY 135 tablet 3  . meloxicam (MOBIC) 15 MG tablet TAKE 1 TABLET BY MOUTH  DAILY 90 tablet 1  . Multiple Vitamins-Minerals (MULTIVITAMIN WITH MINERALS) tablet Take 1 tablet by mouth daily.    Marland Kitchen omeprazole (PRILOSEC) 20 MG capsule TAKE 1 CAPSULE BY MOUTH  DAILY 90 capsule 1  . propranolol (INDERAL) 10 MG tablet Take 10 mg by mouth 3 (three) times daily.    . traMADol (ULTRAM) 50 MG tablet One half to one tablet every 8 hours as needed pain 90 tablet 0  . diphenoxylate-atropine (LOMOTIL) 2.5-0.025 MG tablet Take 1 tablet by mouth 4 (four) times daily as needed for diarrhea or loose stools. (Patient not taking: Reported on 05/07/2018) 30 tablet 0  . nystatin (MYCOSTATIN/NYSTOP) powder Apply topically 4 (four) times daily. (Patient not taking: Reported on 04/06/2018) 15 g 3   No current facility-administered medications for this visit.     Review of Systems  Constitutional: Negative for diaphoresis, fever, malaise/fatigue and weight loss (up 3 pounds).       Feels "ok".  HENT: Negative.  Negative for congestion, ear discharge, ear pain, nosebleeds, sinus pain and sore throat.   Eyes: Negative.  Negative for double vision, pain and discharge.  Respiratory: Negative.  Negative for cough, hemoptysis, sputum production and shortness of breath.   Cardiovascular: Negative.  Negative for chest pain, palpitations, leg swelling and PND.   Gastrointestinal: Negative for abdominal pain, blood in stool, constipation, diarrhea, melena, nausea and vomiting.       Irritable bowel syndrome.  Sigmoid colon diverticulosis. Hiatal hernia.   Genitourinary: Negative.  Negative for dysuria, frequency, hematuria and urgency.  Musculoskeletal: Positive for back pain and joint pain (BILATERAL knee pain). Negative for falls and myalgias.       Right lower back discomfort s/p prior radiation.  Skin: Negative for itching and rash.  Neurological: Negative for dizziness, tremors, weakness and headaches.  Endo/Heme/Allergies: Does not bruise/bleed easily.  Psychiatric/Behavioral: Negative for depression, memory loss and suicidal ideas. The patient is not nervous/anxious and does not have insomnia.   All other systems reviewed and are negative.  Performance status (ECOG): 2 - Symptomatic, <50% confined to bed  Physical Exam: Blood pressure (!) 148/84, pulse 76, temperature 98.3 F (36.8 C), temperature source Tympanic, resp. rate 18, weight 203 lb 2 oz (92.1 kg). GENERAL:  Elderly woman sitting comfortably in the exam room in no acute distress.  She has a rolling walker at her side. MENTAL STATUS:  Alert and oriented to person, place and time. HEAD:  Short styled gray hair.  Normocephalic, atraumatic, face symmetric, no Cushingoid features. EYES:  Hazel eyes.  Pupils equal round and reactive to light and accomodation.  No conjunctivitis or scleral icterus. ENT:  Oropharynx clear without lesion.  Tongue normal. Mucous membranes moist.  RESPIRATORY:  Clear to auscultation without rales, wheezes or rhonchi. CARDIOVASCULAR:  Regular rate and rhythm without murmur, rub or gallop. ABDOMEN:  Soft, non-tender, with active bowel sounds, and no hepatosplenomegaly.  No masses. SKIN:  No rashes, ulcers or lesions. EXTREMITIES: No edema, no skin discoloration or tenderness.  No palpable cords. LYMPH NODES: left axillary scarring.  No palpable cervical,  supraclavicular, axillary or inguinal adenopathy  NEUROLOGICAL: Unremarkable. PSYCH:  Appropriate.    No visits with results within 3 Day(s) from this visit.  Latest known visit with results is:  Hospital Outpatient Visit on 07/10/2017  Component Date Value Ref Range Status  . SURGICAL PATHOLOGY 07/10/2017    Final-Edited                   Value:Surgical Pathology THIS IS AN ADDENDUM REPORT CASE: [id number] PATIENT: [facility] Surgical Pathology Report Addendum   Reason for Addendum #1:  Additional clinical/test information  SPECIMEN SUBMITTED: A. Axilla, left  CLINICAL HISTORY: Left axillary mass in a patient with history of prior ipsilateral breast cancer  PRE-OPERATIVE DIAGNOSIS: Metastatic disease vs second primary cancer  POST-OPERATIVE DIAGNOSIS: None provided.     DIAGNOSIS: A.  LEFT AXILLA; ULTRASOUND-GUIDED NEEDLE CORE BIOPSY: - INVASIVE MAMMARY CARCINOMA WITH FOCAL LOBULAR FEATURES.  Size of invasive carcinoma:  1.1 cm in this sample Histologic grade of invasive carcinoma: Grade 2      Glandular/tubular differentiation score: 3      Nuclear pleomorphism score: 2      Mitotic rate score: 1  ER/PR/HER2: Immunohistochemistry will be performed, with reflex to [place] for HER2 2+ (equivocal) results. The results will be reported in an addendum.  The                          diagnosis was called to Eino Farber of [facility] on 07/11/17 at 227 PM.   GROSS DESCRIPTION:  A. The specimen is received in a formalin-filled container labeled with the patient's name and left axillary mass.  Core pieces: multiple, 2.0 x 0.9 x 0.1 cm Comments: yellow to red lobulated fibrofatty, marked green  Entirely submitted in cassette(s): 1  Time/Date in fixative: placed in formalin at 1:50 PM on 07/10/17 cold ischemic time of less than 5 minutes Total fixation time: 6.5 hours    Final Diagnosis performed by Delorse Lek, MD.  Electronically  signed 07/11/2017 2:29:14PM   The electronic signature indicates that the named Attending Pathologist has evaluated the specimen  Technical component performed at [facility], [address] [place], [place] [address] Lab: [phone] Dir: Darrick Penna. Evette Doffing, MD  Professional component performed at [facility], [facility], [place], [place], [place] [address] Lab: [phone]  Dir: Dellia Nims. Rubinas, MD   Breast Biomarker Reporting Template  BREAST BIOMARKER TESTS Estrogen Receptor (ER) Status: POSITIVE, >90% nuclear staining      Average intensity of staining: Strong Progesterone Receptor (PgR) Status: POSITIVE, >90% nuclear staining      Average intensity of staining: Strong HER2 (by immunohistochemistry): EQUIVOCAL, 2+ Percentage of cells with uniform intense complete membrane staining: 0%  METHODS Cold Ischemia and Fixation Times: Meet requirements specified in latest version of the ASCO/CAP guidelines Testing Performed on Block Number(s): A1 Fixative: Formalin Estrogen Receptor:  FDA cleared (Ventana)                    Primary Antibody:  SP1 Progesterone Receptor: FDA cleared (Ventana)                   Primary Antibody: 1E2 HER2 (by immunohistochemistry): FDA approved (DAKO)                             Primary Antibody: HercepTest  Immunohistochemistry controls worked appropriately. Slides were prepared by [facility] for [facility], [place], [place], and interpreted by Dr. Luana Shu.      Addendum #1 performed by Delorse Lek, MD.  Electronically signed 07/15/2017 1:36:03PM    Technical component performed at [facility], [address] [place], [place] [address] Lab: [phone] Dir: Darrick Penna. Evette Doffing, MD  Professional component performed at [facility], [facility], [place], [place], [place] [address] Lab: [phone] Dir: Dellia Nims. Rubinas, MD       Assessment:  PERL FOLMAR is a 84 y.o. female with metastatic breast cancer s/p left axillary biopsy on 07/10/2017.  Pathology revealed a 1.1 cm grade II invasive mammary carcinoma with focal lobular features.   Wide excision on 08/20/2017 revealed metastatic mammary carcinoma involving 1 of 4 axillary lymph nodes with extracapsular extension.  The superior medial margin was involved.  Tumor was ER + (> 90%), PR + (> 90%), and Her2/neu 2+ (FISH -).   [facility] genetic testing on 09/26/2017 revealed no pathogenic sequence variants or deletions/duplications.  BRCA1/2 testing on 09/26/2017 was negative.  She has a history of left breast cancer s/p wide excision and sentinel lymph node biopsy on 07/18/1999 at [facility].  Pathology revealed a 0.8 x 0.6 x 0.4 cm grade II invasive ductal adenocarcinoma.  There was no lymphovascular invasion.  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  Margins were negative.  Tumor was ER + (50%) and PR + (90%).  Pathologic stage was T1bN0.  She received 6200 cGy from 08/21/1999 - 10/03/1999.  She completed 5 years of tamoxifen in 06/2004.    PET scan on 09/16/2017 revealed widespread scattered hypermetabolic lytic osseous metastases throughout the axial and proximal appendicular skeleton.  Bilateral proximal femoral skeletal metastases placed the patient at risk for pathologic hip fractures.  There was mild hypermetabolic right hilar lymphadenopathy.  Plain films of the bilateral femurs on 09/23/2017 revealed no pathologic fracture. The LEFT view showed lytic subtrochanteric metastasis that was previously seen on PET scan.   PET scan on 06/01/2018 that demonstrated marked improvement in the osseous metastatic disease.  Previously demonstrated areas of abnormally high metabolic activity have resolved, with the exception of a very faint area of residual activity (SUV 2.6; previously 11.5) in the  LEFT subtrochanteric region. Previous LEFT axillary fluid collection now  shows a 1.8 x 1.2 cm scarlike density with low-grade activity (SUV 2.7). Incidental findings include a small hiatal hernia and diverticulosis of the sigmoid colon.   Lumbar spine MRI on 02/11/2018 revealed multifocal lumbosacral vertebral body signal abnormalities consistent with osseous metastatic disease. The largest lesion was at L1.  There was no pathologic compression fracture. There was multi-level moderate to severe neural foraminal stenosis, which had progressed since the prior study.  CA27.29 has been followed:  14.7 on 02/05/2017, 19.1 on 02/06/2012, 172.2 on 09/04/2017, 144.9 on 11/06/2017, 73.4 on 02/05/2018, 63.3 on 03/06/2018, 47.2 on 04/06/2018, 42.4 on 05/07/2018, and 36.6 on 06/08/2018.  She received a palliative course of radiation (3000 cGy in 10 fractions) to the left hip from 10/14/2017 - 10/27/2017.  She received a course of palliative radiation the the lumbar spine and SI joints from 03/04/2018 - 03/27/2018.  She began Faslodex on 09/26/2017 (last 05/07/2017).  She began monthly Xgeva on 09/26/2017 (last 05/07/2018).    Bone density on 09/01/2017 revealed osteopenia with a T-score of -1.9 in the right femoral neck and left forearm. She is on calcium and vitamin D.  Symptomatically, she notes chronic knee pain.  She has discomfort at the site of radiation (lower back on the right side).  Weight is up 3 pounds.  Exam is stable. Calcium is 9.1.  Plan: 1. Labs today:  CBC with diff, CMP, CA27.29. 2. Review PET scan -  marked improvement in the osseous metastatic disease.    Areas of abnormally high metabolic activity have resolved, with the exception of a very faint area of residual activity (SUV 2.6; previously 11.5) in the LEFT subtrochanteric region.   Previous LEFT axillary fluid collection now shows a 1.8 x 1.2 cm scarlike density with low-grade activity (SUV 2.7).   Incidental findings include a small hiatal hernia and diverticulosis of the sigmoid colon.  3. Faslodex  today.  4. Xgeva held today secondary to dental issue.  5. Discuss osteopenia. Continue supplemental calcium 1200 mg and vitamin 800 IU daily.  6. Discuss nutrition consult for desired weight loss.  7. Discuss ongoing decline in CA27.29 and improvement in exam.  She is doing well.   8. RTC in 4 weeks for MD assessment, labs (CBC with diff, CMP, CA27.29), Faslodex and Xgeva.   Honor Loh, NP 06/08/2018, 3:35 PM  I saw and evaluated the patient, participating in the key portions of the service and reviewing pertinent diagnostic studies and records.  I reviewed the nurse practitioner's note and agree with the findings and the plan.  The assessment and plan were discussed with the patient.  Several questions were asked by the patient and answered.   Nolon Stalls, MD 06/08/2018, 3:35 PM

## 2018-06-08 ENCOUNTER — Inpatient Hospital Stay: Payer: Medicare Other

## 2018-06-08 ENCOUNTER — Inpatient Hospital Stay (HOSPITAL_BASED_OUTPATIENT_CLINIC_OR_DEPARTMENT_OTHER): Payer: Medicare Other | Admitting: Hematology and Oncology

## 2018-06-08 ENCOUNTER — Other Ambulatory Visit: Payer: Self-pay

## 2018-06-08 ENCOUNTER — Inpatient Hospital Stay: Payer: Medicare Other | Attending: Hematology and Oncology

## 2018-06-08 ENCOUNTER — Other Ambulatory Visit: Payer: Self-pay | Admitting: *Deleted

## 2018-06-08 ENCOUNTER — Encounter: Payer: Self-pay | Admitting: Hematology and Oncology

## 2018-06-08 VITALS — BP 148/84 | HR 76 | Temp 98.3°F | Resp 18 | Wt 203.1 lb

## 2018-06-08 DIAGNOSIS — K5730 Diverticulosis of large intestine without perforation or abscess without bleeding: Secondary | ICD-10-CM

## 2018-06-08 DIAGNOSIS — G8929 Other chronic pain: Secondary | ICD-10-CM | POA: Diagnosis not present

## 2018-06-08 DIAGNOSIS — Z170 Estrogen receptor positive status [ER+]: Secondary | ICD-10-CM

## 2018-06-08 DIAGNOSIS — M25569 Pain in unspecified knee: Secondary | ICD-10-CM | POA: Diagnosis not present

## 2018-06-08 DIAGNOSIS — C50912 Malignant neoplasm of unspecified site of left female breast: Secondary | ICD-10-CM | POA: Diagnosis not present

## 2018-06-08 DIAGNOSIS — C7951 Secondary malignant neoplasm of bone: Secondary | ICD-10-CM | POA: Diagnosis not present

## 2018-06-08 DIAGNOSIS — K449 Diaphragmatic hernia without obstruction or gangrene: Secondary | ICD-10-CM | POA: Insufficient documentation

## 2018-06-08 DIAGNOSIS — C773 Secondary and unspecified malignant neoplasm of axilla and upper limb lymph nodes: Secondary | ICD-10-CM

## 2018-06-08 DIAGNOSIS — Z9221 Personal history of antineoplastic chemotherapy: Secondary | ICD-10-CM

## 2018-06-08 DIAGNOSIS — Z7189 Other specified counseling: Secondary | ICD-10-CM

## 2018-06-08 DIAGNOSIS — M85851 Other specified disorders of bone density and structure, right thigh: Secondary | ICD-10-CM

## 2018-06-08 DIAGNOSIS — Z923 Personal history of irradiation: Secondary | ICD-10-CM | POA: Insufficient documentation

## 2018-06-08 DIAGNOSIS — Z5111 Encounter for antineoplastic chemotherapy: Secondary | ICD-10-CM | POA: Insufficient documentation

## 2018-06-08 DIAGNOSIS — C50412 Malignant neoplasm of upper-outer quadrant of left female breast: Secondary | ICD-10-CM

## 2018-06-08 DIAGNOSIS — Z853 Personal history of malignant neoplasm of breast: Secondary | ICD-10-CM

## 2018-06-08 LAB — CBC WITH DIFFERENTIAL/PLATELET
Basophils Absolute: 0 10*3/uL (ref 0–0.1)
Basophils Relative: 1 %
Eosinophils Absolute: 0.3 10*3/uL (ref 0–0.7)
Eosinophils Relative: 5 %
HCT: 34.8 % — ABNORMAL LOW (ref 35.0–47.0)
Hemoglobin: 12.1 g/dL (ref 12.0–16.0)
Lymphocytes Relative: 19 %
Lymphs Abs: 1.1 10*3/uL (ref 1.0–3.6)
MCH: 30.7 pg (ref 26.0–34.0)
MCHC: 34.7 g/dL (ref 32.0–36.0)
MCV: 88.6 fL (ref 80.0–100.0)
Monocytes Absolute: 0.5 10*3/uL (ref 0.2–0.9)
Monocytes Relative: 9 %
Neutro Abs: 3.6 10*3/uL (ref 1.4–6.5)
Neutrophils Relative %: 66 %
Platelets: 218 10*3/uL (ref 150–440)
RBC: 3.93 MIL/uL (ref 3.80–5.20)
RDW: 15.4 % — ABNORMAL HIGH (ref 11.5–14.5)
WBC: 5.4 10*3/uL (ref 3.6–11.0)

## 2018-06-08 LAB — COMPREHENSIVE METABOLIC PANEL
ALT: 13 U/L (ref 0–44)
AST: 20 U/L (ref 15–41)
Albumin: 3.9 g/dL (ref 3.5–5.0)
Alkaline Phosphatase: 65 U/L (ref 38–126)
Anion gap: 9 (ref 5–15)
BUN: 25 mg/dL — ABNORMAL HIGH (ref 8–23)
CO2: 27 mmol/L (ref 22–32)
Calcium: 9.1 mg/dL (ref 8.9–10.3)
Chloride: 98 mmol/L (ref 98–111)
Creatinine, Ser: 1.02 mg/dL — ABNORMAL HIGH (ref 0.44–1.00)
GFR calc Af Amer: 54 mL/min — ABNORMAL LOW (ref >60)
GFR calc non Af Amer: 47 mL/min — ABNORMAL LOW (ref >60)
Glucose, Bld: 92 mg/dL (ref 70–99)
Potassium: 3.9 mmol/L (ref 3.5–5.1)
Sodium: 134 mmol/L — ABNORMAL LOW (ref 135–145)
Total Bilirubin: 0.5 mg/dL (ref 0.3–1.2)
Total Protein: 7.2 g/dL (ref 6.5–8.1)

## 2018-06-08 MED ORDER — FULVESTRANT 250 MG/5ML IM SOLN
500.0000 mg | Freq: Once | INTRAMUSCULAR | Status: AC
  Administered 2018-06-08: 500 mg via INTRAMUSCULAR
  Filled 2018-06-08: qty 10

## 2018-06-08 MED ORDER — DENOSUMAB 120 MG/1.7ML ~~LOC~~ SOLN
120.0000 mg | Freq: Once | SUBCUTANEOUS | Status: DC
  Filled 2018-06-08: qty 1.7

## 2018-06-08 NOTE — Progress Notes (Signed)
Pt in for follow up and test results.  Pt denies any concerns.

## 2018-06-08 NOTE — Progress Notes (Signed)
Patient NOT to receive Xgeva per Doran Durand, NP

## 2018-06-08 NOTE — Progress Notes (Signed)
Patient on schedule to xgeva and faslodex.  Per MD, to get faslodex only today

## 2018-06-09 LAB — CANCER ANTIGEN 27.29: CA 27.29: 36.6 U/mL (ref 0.0–38.6)

## 2018-06-28 NOTE — Progress Notes (Signed)
Cardiology Office Note  Date:  07/01/2018   ID:  Kelli Williams, DOB 03/16/1926, MRN [id number]  PCP:  Glean Hess, MD   Chief Complaint  Patient presents with  . Other    12 month follow up. Meds reviewed by the pt. verbally. Pt. c/o LE edema and rapid heart beats at times. Pt. has chemo once montly.     HPI:  Kelli Williams is a very pleasant 84 year old woman with no known coronary artery disease or cardiac issues  Status post radiation treatment for breast cancer in 2000 on the left with 7 weeks of radiation on a daily basis.  She lost her husband in March 2016. 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 Moderate cardiomegaly. On review images with her this shows at least mild to moderate diffuse aortic atherosclerosis extending up to the carotids, some degree of coronary calcification/difficult to determine secondary to motion artifact  On denosumab for cancer  Denies any significant shortness of breath or chest pain on exertion Does have some weakness in her legs, joint discomfort, ore muscles Otherwise feels relatively well  EKG personally reviewed by myself on todays visit Shows normal sinus rhythm rate 58 bpm poor R-wave progression to the anterior precordial leads  She lives at the [facility].  finished PT, was not happy with  their service She has a back brace coming in the mail Having severe pain left knee Was told she was too old for total knee replacement  Otherwise has rare palpitations at nighttime, takes propranolol as needed Scant swelling lower extremities, does not want to wear compression hose Blood pressure well controlled at home No regular exercise, weight stable 196 pounds  Previously reported having Chronic pain in her foot following a  Fracture, walks with a cane  EKG on today's visit shows normal sinus rhythm with rate 59 bpm, no significant ST or T-wave changes  Other past medical history  She did have stress test in 2012 at the recommendation of her cardiologist at [facility] She reports having a previous fracture in her right foot, seen by podiatry and Dr. Marry Guan  Lab work from September 2013 shows total cholesterol 167, LDL 91  PMH:   has a past medical history of Anxiety, Arthritis, Breast cancer ([place]) (2094), Complication of anesthesia, Dysrhythmia, GERD (gastroesophageal reflux disease), Hypertension, Neuropathic pain of both legs, Neuropathy, Pneumonia (2016), Skin cancer of forehead, and Skin cancer of nose.  PSH:    Past Surgical History:  Procedure Laterality Date  . ABDOMINAL HYSTERECTOMY    . AXILLARY LYMPH NODE BIOPSY Left 07/10/2017   INVASIVE MAMMARY CARCINOMA WITH FOCAL LOBULAR FEATURES.   Marland Kitchen BREAST BIOPSY Bilateral 1960   benign  . BREAST LUMPECTOMY Left 2000   breast ca with rad tx  . BREAST LUMPECTOMY Left 08/20/2017  . BREAST LUMPECTOMY Left 08/20/2017   Procedure: left breast wide excision;  Surgeon: Robert Bellow, MD;  Location: [facility] ORS;  Service: General;  Laterality: Left;  . CATARACT EXTRACTION     left eye  . CHOLECYSTECTOMY    . JOINT REPLACEMENT    . NASAL SEPTUM SURGERY    . PARTIAL KNEE ARTHROPLASTY     right  . TOTAL VAGINAL HYSTERECTOMY      Current Outpatient Medications  Medication Sig Dispense Refill  . acetaminophen (TYLENOL) 325 MG  tablet Take 650 mg by mouth every 6 (six) hours as needed.    Marland Kitchen aspirin 81 MG tablet Take 1 tablet by mouth daily.    . Calcium-Magnesium 500-250 MG TABS Take 2 tablets by mouth daily.     . Coenzyme Q10 (CO Q-10) 100 MG CAPS Take by mouth daily.    . diphenoxylate-atropine (LOMOTIL) 2.5-0.025 MG tablet Take 1 tablet by mouth 4 (four) times daily as needed for diarrhea or loose stools. 30 tablet 0  . gabapentin (NEURONTIN) 300 MG capsule TAKE 3 CAPSULES BY MOUTH  TWO TIMES DAILY 540 capsule 1  . lisinopril-hydrochlorothiazide (PRINZIDE,ZESTORETIC) 20-25 MG tablet TAKE 1 AND 1/2 TABLETS BY  MOUTH DAILY 135 tablet 3  . meloxicam (MOBIC) 15 MG tablet TAKE 1 TABLET BY MOUTH  DAILY 90 tablet 1  . Multiple Vitamins-Minerals (MULTIVITAMIN WITH MINERALS) tablet Take 1 tablet by mouth daily.    Marland Kitchen nystatin (MYCOSTATIN/NYSTOP) powder Apply topically 4 (four) times daily. (Patient taking differently: Apply topically 4 (four) times daily as needed. ) 15 g 3  . omeprazole (PRILOSEC) 20 MG capsule TAKE 1 CAPSULE BY MOUTH  DAILY 90 capsule 1  . propranolol (INDERAL) 10 MG tablet Take 10 mg by mouth 3 (three) times daily.    . traMADol (ULTRAM) 50 MG tablet One half to one tablet every 8 hours as needed pain 90 tablet 0   No current facility-administered medications for this visit.      Allergies:   Amitriptyline; Amoxicillin; Ivp dye [iodinated diagnostic agents]; Tape; Sulfa antibiotics; and Sulfa antibiotics   Social History:  The patient  reports that she has never smoked. She has never used smokeless tobacco. She reports that she does not drink alcohol or use drugs.   Family History:   family history includes Breast cancer (age of onset: 43) in her daughter; Cancer in her paternal grandmother; Heart attack in her paternal uncle; Heart disease in her mother.    Review of Systems: Review of Systems  Respiratory: Negative.   Cardiovascular: Negative.   Gastrointestinal: Negative.   Musculoskeletal:  Positive for joint pain.       Difficulty walking  Neurological: Positive for weakness.  Psychiatric/Behavioral: Negative.   All other systems reviewed and are negative.    PHYSICAL EXAM: VS:  BP 132/84 (BP Location: Right Arm, Patient Position: Sitting, Cuff Size: Normal)   Pulse (!) 58   Ht 5\' 4"  (1.626 m)   Wt 196 lb 12 oz (89.2 kg)   BMI 33.77 kg/m  , BMI Body mass index is 33.77 kg/m. Constitutional:  oriented to person, place, and time. No distress.  HENT:  Head: Normocephalic and atraumatic.  Eyes:  no discharge. No scleral icterus.  Neck: Normal range of motion. Neck supple. No JVD present.  Cardiovascular: Normal rate, regular rhythm, normal heart sounds and intact distal pulses. Exam reveals no gallop and no friction rub. No edema No murmur heard. Pulmonary/Chest: Effort normal and breath sounds normal. No stridor. No respiratory distress.  no wheezes.  no rales.  no tenderness.  Abdominal: Soft.  no distension.  no tenderness.  Musculoskeletal: Normal range of motion.  no  tenderness or deformity.  Neurological:  normal muscle tone. Coordination normal. No atrophy Skin: Skin is warm and dry. No rash noted. not diaphoretic.  Psychiatric:  normal mood and affect. behavior is normal. Thought content normal.    Recent Labs: 02/05/2018: TSH 2.321 04/02/2018: Magnesium 1.9 06/08/2018: ALT 13; BUN 25; Creatinine, Ser 1.02; Hemoglobin 12.1; Platelets 218; Potassium 3.9; Sodium 134    Lipid Panel Lab Results  Component Value Date   CHOL 170 06/19/2016   HDL 67 06/19/2016   LDLCALC 86 06/19/2016   TRIG 85 06/19/2016      Wt Readings from Last 3 Encounters:  07/01/18 196 lb 12 oz (89.2 kg)  06/29/18 198 lb 12.8 oz (90.2 kg)  06/08/18 203 lb 2 oz (92.1 kg)       ASSESSMENT AND PLAN:  Essential hypertension - Plan: EKG 12-Lead Blood pressure is well controlled on today's visit. No changes made to the medications. stable  Atrial tachycardia (HCC) - Plan: EKG  12-Lead She'll continue to take propranolol as needed,  He does continue to take propranolol periodically for fast heartbeat In general well controlled  Adjustment disorder with other symptom - Plan: EKG 12-Lead loss of her husband Now dealing with cancer Recommend she try to stay active and engage in social activities  Leg swelling - Plan: EKG 12-Lead Minimal swelling on today's visit, mild venous insufficiency  Breast cancer On chemotherapy medication has completed XRT Improvement in her metastases to bone  Aortic atherosclerosis Mild-to-moderate in nature Recommended more aggressive lipid management  would avoid statins given her Leg discomfort We will add Zetia 10 mg daily  Coronary calcification Seen on CT scan Denies having symptoms of angina. Long discussion concerning symptoms to watch for If she does develop anginal symptoms we would order stress testing For now will continue medical management    Total encounter time more than 45 minutes  Greater than 50% was spent in counseling and coordination of care with the patient   Disposition:   F/U  12 months   Orders Placed This Encounter  Procedures  . EKG 12-Lead     Signed, Esmond Plants, M.D., Ph.D. 07/01/2018  [place], [place]

## 2018-06-29 ENCOUNTER — Ambulatory Visit (INDEPENDENT_AMBULATORY_CARE_PROVIDER_SITE_OTHER): Payer: Medicare Other

## 2018-06-29 VITALS — BP 138/62 | HR 64 | Temp 98.1°F | Resp 12 | Ht 65.0 in | Wt 198.8 lb

## 2018-06-29 DIAGNOSIS — Z0000 Encounter for general adult medical examination without abnormal findings: Secondary | ICD-10-CM

## 2018-06-29 NOTE — Progress Notes (Signed)
Subjective:   Kelli Williams is a 84 y.o. female who presents for Medicare Annual (Subsequent) preventive examination.  Review of Systems:  N/A Cardiac Risk Factors include: advanced age (>55men, >51 women);hypertension;sedentary lifestyle;obesity (BMI >30kg/m2)     Objective:     Vitals: BP 138/62 (BP Location: Right Arm, Patient Position: Sitting, Cuff Size: Normal)   Pulse 64   Temp 98.1 F (36.7 C) (Oral)   Resp 12   Ht 5\' 5"  (1.651 m)   Wt 198 lb 12.8 oz (90.2 kg)   SpO2 92%   BMI 33.08 kg/m   Body mass index is 33.08 kg/m.  Advanced Directives 06/29/2018 06/08/2018 05/07/2018 04/06/2018 03/06/2018 02/17/2018 02/05/2018  Does Patient Have a Medical Advance Directive? Yes Yes No Yes Yes Yes Yes  Type of Paramedic of [place];Living will [place];Living will - Living will;Healthcare Power of [place];Living will [place];Living will -  Copy of [place] in Chart? Yes - No - copy requested Yes - No - copy requested -  Would patient like information on creating a medical advance directive? - - No - Patient declined - - - -    Tobacco Social History   Tobacco Use  Smoking Status Never Smoker  Smokeless Tobacco Never Used  Tobacco Comment   smoking cessation materials not required     Counseling given: No Comment: smoking cessation materials not required  Clinical Intake:  Pre-visit preparation completed: Yes  Pain : No/denies pain   BMI - recorded: 33.08 Nutritional Status: BMI > 30  Obese Nutritional Risks: None Diabetes: No  How often do you need to have someone help you when you read instructions, pamphlets, or other written materials from your doctor or pharmacy?: 1 - Never  Interpreter Needed?: No  Information entered by :: AEversole, LPN  Past Medical History:  Diagnosis Date  . Anxiety   . Arthritis    BOTH FEET AND KNEES  . Breast  cancer ([place]) 2000   left breast ca with lumpectomy and rad tx 5 yr tamoxifen/Dr Jeannine Kitten at [place]  . Complication of anesthesia    WOKE UP DURING SURGERY FOR DEVIATED SEPTUM, KNEE REPLACEMENT AND BREAST LUMPECTOMY  . Dysrhythmia   . GERD (gastroesophageal reflux disease)   . Hypertension   . Neuropathic pain of both legs   . Neuropathy   . Pneumonia 2016  . Skin cancer of forehead   . Skin cancer of nose    Past Surgical History:  Procedure Laterality Date  . ABDOMINAL HYSTERECTOMY    . AXILLARY LYMPH NODE BIOPSY Left 07/10/2017   INVASIVE MAMMARY CARCINOMA WITH FOCAL LOBULAR FEATURES.   Marland Kitchen BREAST BIOPSY Bilateral 1960   benign  . BREAST LUMPECTOMY Left 2000   breast ca with rad tx  . BREAST LUMPECTOMY Left 08/20/2017  . BREAST LUMPECTOMY Left 08/20/2017   Procedure: left breast wide excision;  Surgeon: Robert Bellow, MD;  Location: [facility] ORS;  Service: General;  Laterality: Left;  . CATARACT EXTRACTION     left eye  . CHOLECYSTECTOMY    . JOINT REPLACEMENT    . NASAL SEPTUM SURGERY    . PARTIAL KNEE ARTHROPLASTY     right  . TOTAL VAGINAL HYSTERECTOMY     Family History  Problem Relation Age of Onset  . Heart disease Mother   . Heart attack Paternal Uncle   . Breast cancer Daughter 29  genetic negative  . Cancer Paternal Grandmother    Social History   Socioeconomic History  . Marital status: Widowed    Spouse name: Not on file  . Number of children: 3  . Years of education: some college  . Highest education level: 12th grade  Occupational History  . Occupation: Retired  Scientific laboratory technician  . Financial resource strain: Not hard at all  . Food insecurity:    Worry: Never true    Inability: Never true  . Transportation needs:    Medical: No    Non-medical: No  Tobacco Use  . Smoking status: Never Smoker  . Smokeless tobacco: Never Used  . Tobacco comment: smoking cessation materials not required  Substance and Sexual Activity  . Alcohol use: No     Alcohol/week: 0.0 oz  . Drug use: No  . Sexual activity: Not Currently  Lifestyle  . Physical activity:    Days per week: 0 days    Minutes per session: 0 min  . Stress: Not at all  Relationships  . Social connections:    Talks on phone: Patient refused    Gets together: Patient refused    Attends religious service: Patient refused    Active member of club or organization: Patient refused    Attends meetings of clubs or organizations: Patient refused    Relationship status: Widowed  Other Topics Concern  . Not on file  Social History Narrative   ** Merged History Encounter **        Outpatient Encounter Medications as of 06/29/2018  Medication Sig  . acetaminophen (TYLENOL) 325 MG tablet Take 650 mg by mouth every 6 (six) hours as needed.  Marland Kitchen aspirin 81 MG tablet Take 1 tablet by mouth daily.  . Calcium-Magnesium 500-250 MG TABS Take 1 tablet by mouth daily.   . Coenzyme Q10 (CO Q-10) 100 MG CAPS Take by mouth daily.  . diphenoxylate-atropine (LOMOTIL) 2.5-0.025 MG tablet Take 1 tablet by mouth 4 (four) times daily as needed for diarrhea or loose stools.  . gabapentin (NEURONTIN) 300 MG capsule TAKE 3 CAPSULES BY MOUTH  TWO TIMES DAILY  . lisinopril-hydrochlorothiazide (PRINZIDE,ZESTORETIC) 20-25 MG tablet TAKE 1 AND 1/2 TABLETS BY  MOUTH DAILY  . meloxicam (MOBIC) 15 MG tablet TAKE 1 TABLET BY MOUTH  DAILY  . Multiple Vitamins-Minerals (MULTIVITAMIN WITH MINERALS) tablet Take 1 tablet by mouth daily.  Marland Kitchen nystatin (MYCOSTATIN/NYSTOP) powder Apply topically 4 (four) times daily.  Marland Kitchen omeprazole (PRILOSEC) 20 MG capsule TAKE 1 CAPSULE BY MOUTH  DAILY  . propranolol (INDERAL) 10 MG tablet Take 10 mg by mouth 3 (three) times daily.  . traMADol (ULTRAM) 50 MG tablet One half to one tablet every 8 hours as needed pain   No facility-administered encounter medications on file as of 06/29/2018.     Activities of Daily Living In your present state of health, do you have any difficulty  performing the following activities: 06/29/2018  Hearing? N  Comment denies hearing aids  Vision? N  Comment wears eyeglasses  Difficulty concentrating or making decisions? N  Walking or climbing stairs? Y  Comment avoids stairs d/t joint pain  Dressing or bathing? N  Doing errands, shopping? N  Preparing Food and eating ? N  Comment denies dentures  Using the Toilet? N  In the past six months, have you accidently leaked urine? N  Do you have problems with loss of bowel control? N  Managing your Medications? N  Managing your Finances? N  Housekeeping or managing your Housekeeping? N  Some recent data might be hidden    Patient Care Team: Glean Hess, MD as PCP - General (Internal Medicine) [name], MD as Consulting Physician (Cardiology) Renata Caprice as Physician Assistant (Orthopedic Surgery) Bary Castilla, Forest Gleason, MD (General Surgery) Hessie Knows, MD as Consulting Physician (Orthopedic Surgery) Lequita Asal, MD as Consulting Physician (Hematology and Oncology)    Assessment:   This is a routine wellness examination for Breelle.  Exercise Activities and Dietary recommendations Current Exercise Habits: The patient does not participate in regular exercise at present, Exercise limited by: Other - see comments(joint pain, bone mets)  Goals    . DIET - INCREASE WATER INTAKE     Recommend to drink at least 6-8 8oz glasses of water per day.       Fall Risk Fall Risk  06/29/2018 06/27/2017 10/16/2016 06/19/2016 06/19/2015  Falls in the past year? Yes Yes Yes Yes No  Comment tripping and loss of balance - - - -  Number falls in past yr: 2 or more 2 or more 1 1 -  Injury with Fall? No No Yes No -  Risk Factor Category  High Fall Risk High Fall Risk - - -  Risk for fall due to : Impaired vision;Impaired balance/gait;Medication side effect;History of fall(s) - - Impaired balance/gait -  Risk for fall due to: Comment wears eyeglasses; ambulates with  rolling walker, joint pain - - - -  Follow up Falls evaluation completed;Education provided;Falls prevention discussed - Falls prevention discussed;Follow up appointment Falls prevention discussed -   FALL RISK PREVENTION PERTAINING TO HOME: Is your home free of loose throw rugs in walkways, pet beds, electrical cords, etc? Yes Is there adequate lighting in your home to reduce risk of falls?  Yes Are there stairs in or around your home WITH handrails? No stairs  ASSISTIVE DEVICES UTILIZED TO PREVENT FALLS: Use of a cane, walker or w/c? Yes, rolling walker Grab bars in the bathroom? Yes  Shower chair or a place to sit while bathing? Yes An elevated toilet seat or a handicapped toilet? Yes  Timed Get Up and Go Performed: Yes. Pt ambulated 10 feet within 30 sec. Gait slow, steady and with the use of an assistive device. No intervention required at this time. Fall risk prevention has been discussed.  Community Resource Referral:  [place] Referral not required at this time.   Depression Screen PHQ 2/9 Scores 06/29/2018 06/27/2017 10/16/2016 06/19/2016  PHQ - 2 Score 0 2 0 0  PHQ- 9 Score 0 2 - -     Cognitive Function     6CIT Screen 06/29/2018 06/27/2017  What Year? 0 points 0 points  What month? 0 points 0 points  What time? 0 points 0 points  Count back from 20 0 points 0 points  Months in reverse 0 points 0 points  Repeat phrase 0 points 2 points  Total Score 0 2    Immunization History  Administered Date(s) Administered  . Influenza, High Dose Seasonal PF 09/23/2017  . Influenza,inj,Quad PF,6+ Mos 10/16/2016  . Influenza-Unspecified 09/02/2015  . Pneumococcal Conjugate-13 09/08/2014  . Pneumococcal Polysaccharide-23 09/14/1994, 04/30/1997  . Pneumococcal-Unspecified 09/14/1994  . Tdap 06/09/2011  . Zoster 05/12/2006    Qualifies for Shingles Vaccine? Yes. Zostavax completed 05/12/06. Due for Shingrix. Education has been provided regarding the importance of this  vaccine. Pt has been advised to call insurance company to determine out of pocket expense. Advised  may also receive vaccine at local pharmacy or Health Dept. Verbalized acceptance and understanding.  Screening Tests Health Maintenance  Topic Date Due  . INFLUENZA VACCINE  08/20/2018 (Originally 07/09/2018)  . TETANUS/TDAP  06/08/2021  . DEXA SCAN  Completed  . PNA vac Low Risk Adult  Completed    Cancer Screenings: Lung: Low Dose CT Chest recommended if Age 70-80 years, 30 pack-year currently smoking OR have quit w/in 15years. Patient does not qualify. Breast Screening: No longer required   Bone Density/Dexa: No longer required Colorectal: No longer required  Additional Screenings: Hepatitis C Screening: Does not qualify    Plan:  I have personally reviewed and addressed the Medicare Annual Wellness questionnaire and have noted the following in the patient's chart:  A. Medical and social history B. Use of alcohol, tobacco or illicit drugs  C. Current medications and supplements D. Functional ability and status E.  Nutritional status F.  Physical activity G. Advance directives H. List of other physicians I.  Hospitalizations, surgeries, and ER visits in previous 12 months J.  [place] such as hearing and vision if needed, cognitive and depression L. Referrals and appointments  In addition, I have reviewed and discussed with patient certain preventive protocols, quality metrics, and best practice recommendations. A written personalized care plan for preventive services as well as general preventive health recommendations were provided to patient.  Signed,  Aleatha Borer, LPN Nurse Health Advisor  MD Recommendations: Zostavax completed 05/12/06. Due for Shingrix. Education has been provided regarding the importance of this vaccine. Pt has been advised to call insurance company to determine out of pocket expense. Advised may also receive vaccine at local pharmacy or  Health Dept. Verbalized acceptance and understanding.

## 2018-06-29 NOTE — Patient Instructions (Addendum)
[name] , Thank you for taking time to come for your Medicare Wellness Visit. I appreciate your ongoing commitment to your health goals. Please review the following plan we discussed and let me know if I can assist you in the future.   Screening recommendations/referrals: Colorectal Screening: No longer required Mammogram: No longer required Bone Density: No longer required  Vision and Dental Exams: Recommended annual ophthalmology exams for early detection of glaucoma and other disorders of the eye Recommended annual dental exams for proper oral hygiene  Vaccinations: Influenza vaccine: Up to date Pneumococcal vaccine: Up to date Tdap vaccine: Up to date Shingles vaccine: Please call your insurance company to determine your out of pocket expense for the Shingrix vaccine. You may also receive this vaccine at your local pharmacy or Health Dept.    Advanced directives: Please bring a copy of your POA (Power of Attorney) and/or Living Will to your next appointment.  Goals: Recommend to drink at least 6-8 8oz glasses of water per day.  Next appointment: Please schedule your Annual Wellness Visit with your Nurse Health Advisor in one year.  Preventive Care 51 Years and Older, Female Preventive care refers to lifestyle choices and visits with your health care provider that can promote health and wellness. What does preventive care include?  A yearly physical exam. This is also called an annual well check.  Dental exams once or twice a year.  Routine eye exams. Ask your health care provider how often you should have your eyes checked.  Personal lifestyle choices, including:  Daily care of your teeth and gums.  Regular physical activity.  Eating a healthy diet.  Avoiding tobacco and drug use.  Limiting alcohol use.  Practicing safe sex.  Taking low-dose aspirin every day.  Taking vitamin and mineral supplements as recommended by your health care provider. What happens  during an annual well check? The services and screenings done by your health care provider during your annual well check will depend on your age, overall health, lifestyle risk factors, and family history of disease. Counseling  Your health care provider may ask you questions about your:  Alcohol use.  Tobacco use.  Drug use.  Emotional well-being.  Home and relationship well-being.  Sexual activity.  Eating habits.  History of falls.  Memory and ability to understand (cognition).  Work and work Statistician.  Reproductive health. Screening  You may have the following tests or measurements:  Height, weight, and BMI.  Blood pressure.  Lipid and cholesterol levels. These may be checked every 5 years, or more frequently if you are over 55 years old.  Skin check.  Lung cancer screening. You may have this screening every year starting at age 61 if you have a 30-pack-year history of smoking and currently smoke or have quit within the past 15 years.  Fecal occult blood test (FOBT) of the stool. You may have this test every year starting at age 6.  Flexible sigmoidoscopy or colonoscopy. You may have a sigmoidoscopy every 5 years or a colonoscopy every 10 years starting at age 84.  Hepatitis C blood test.  Hepatitis B blood test.  Sexually transmitted disease (STD) testing.  Diabetes screening. This is done by checking your blood sugar (glucose) after you have not eaten for a while (fasting). You may have this done every 1-3 years.  Bone density scan. This is done to screen for osteoporosis. You may have this done starting at age 60.  Mammogram. This may be done every 1-2  years. Talk to your health care provider about how often you should have regular mammograms. Talk with your health care provider about your test results, treatment options, and if necessary, the need for more tests. Vaccines  Your health care provider may recommend certain vaccines, such  as:  Influenza vaccine. This is recommended every year.  Tetanus, diphtheria, and acellular pertussis (Tdap, Td) vaccine. You may need a Td booster every 10 years.  Zoster vaccine. You may need this after age 32.  Pneumococcal 13-valent conjugate (PCV13) vaccine. One dose is recommended after age 33.  Pneumococcal polysaccharide (PPSV23) vaccine. One dose is recommended after age 9. Talk to your health care provider about which screenings and vaccines you need and how often you need them. This information is not intended to replace advice given to you by your health care provider. Make sure you discuss any questions you have with your health care provider. Document Released: 12/22/2015 Document Revised: 08/14/2016 Document Reviewed: 09/26/2015 Elsevier Interactive Patient Education  2017 [employer] Prevention in the Home Falls can cause injuries. They can happen to people of all ages. There are many things you can do to make your home safe and to help prevent falls. What can I do on the outside of my home?  Regularly fix the edges of walkways and driveways and fix any cracks.  Remove anything that might make you trip as you walk through a door, such as a raised step or threshold.  Trim any bushes or trees on the path to your home.  Use bright outdoor lighting.  Clear any walking paths of anything that might make someone trip, such as rocks or tools.  Regularly check to see if handrails are loose or broken. Make sure that both sides of any steps have handrails.  Any raised decks and porches should have guardrails on the edges.  Have any leaves, snow, or ice cleared regularly.  Use sand or salt on walking paths during winter.  Clean up any spills in your garage right away. This includes oil or grease spills. What can I do in the bathroom?  Use night lights.  Install grab bars by the toilet and in the tub and shower. Do not use towel bars as grab bars.  Use  non-skid mats or decals in the tub or shower.  If you need to sit down in the shower, use a plastic, non-slip stool.  Keep the floor dry. Clean up any water that spills on the floor as soon as it happens.  Remove soap buildup in the tub or shower regularly.  Attach bath mats securely with double-sided non-slip rug tape.  Do not have throw rugs and other things on the floor that can make you trip. What can I do in the bedroom?  Use night lights.  Make sure that you have a light by your bed that is easy to reach.  Do not use any sheets or blankets that are too big for your bed. They should not hang down onto the floor.  Have a firm chair that has side arms. You can use this for support while you get dressed.  Do not have throw rugs and other things on the floor that can make you trip. What can I do in the kitchen?  Clean up any spills right away.  Avoid walking on wet floors.  Keep items that you use a lot in easy-to-reach places.  If you need to reach something above you, use a strong step  stool that has a grab bar.  Keep electrical cords out of the way.  Do not use floor polish or wax that makes floors slippery. If you must use wax, use non-skid floor wax.  Do not have throw rugs and other things on the floor that can make you trip. What can I do with my stairs?  Do not leave any items on the stairs.  Make sure that there are handrails on both sides of the stairs and use them. Fix handrails that are broken or loose. Make sure that handrails are as long as the stairways.  Check any carpeting to make sure that it is firmly attached to the stairs. Fix any carpet that is loose or worn.  Avoid having throw rugs at the top or bottom of the stairs. If you do have throw rugs, attach them to the floor with carpet tape.  Make sure that you have a light switch at the top of the stairs and the bottom of the stairs. If you do not have them, ask someone to add them for you. What  else can I do to help prevent falls?  Wear shoes that:  Do not have high heels.  Have rubber bottoms.  Are comfortable and fit you well.  Are closed at the toe. Do not wear sandals.  If you use a stepladder:  Make sure that it is fully opened. Do not climb a closed stepladder.  Make sure that both sides of the stepladder are locked into place.  Ask someone to hold it for you, if possible.  Clearly mark and make sure that you can see:  Any grab bars or handrails.  First and last steps.  Where the edge of each step is.  Use tools that help you move around (mobility aids) if they are needed. These include:  Canes.  Walkers.  Scooters.  Crutches.  Turn on the lights when you go into a dark area. Replace any light bulbs as soon as they burn out.  Set up your furniture so you have a clear path. Avoid moving your furniture around.  If any of your floors are uneven, fix them.  If there are any pets around you, be aware of where they are.  Review your medicines with your doctor. Some medicines can make you feel dizzy. This can increase your chance of falling. Ask your doctor what other things that you can do to help prevent falls. This information is not intended to replace advice given to you by your health care provider. Make sure you discuss any questions you have with your health care provider. Document Released: 09/21/2009 Document Revised: 05/02/2016 Document Reviewed: 12/30/2014 Elsevier Interactive Patient Education  2017 [employer].

## 2018-07-01 ENCOUNTER — Ambulatory Visit: Payer: Medicare Other | Admitting: Cardiovascular Disease

## 2018-07-01 ENCOUNTER — Encounter: Payer: Self-pay | Admitting: Cardiovascular Disease

## 2018-07-01 VITALS — BP 132/84 | HR 58 | Ht 64.0 in | Wt 196.8 lb

## 2018-07-01 DIAGNOSIS — M7989 Other specified soft tissue disorders: Secondary | ICD-10-CM

## 2018-07-01 DIAGNOSIS — I700 Atherosclerosis of aorta: Secondary | ICD-10-CM

## 2018-07-01 DIAGNOSIS — I739 Peripheral vascular disease, unspecified: Secondary | ICD-10-CM | POA: Diagnosis not present

## 2018-07-01 DIAGNOSIS — I471 Supraventricular tachycardia: Secondary | ICD-10-CM

## 2018-07-01 DIAGNOSIS — I10 Essential (primary) hypertension: Secondary | ICD-10-CM

## 2018-07-01 DIAGNOSIS — I2510 Atherosclerotic heart disease of native coronary artery without angina pectoris: Secondary | ICD-10-CM

## 2018-07-01 DIAGNOSIS — C7951 Secondary malignant neoplasm of bone: Secondary | ICD-10-CM | POA: Diagnosis not present

## 2018-07-01 MED ORDER — EZETIMIBE 10 MG PO TABS: 10.0000 mg | ORAL_TABLET | Freq: Every day | ORAL | 3 refills | Status: DC

## 2018-07-01 NOTE — Patient Instructions (Addendum)
Medication Instructions:   Please start zetia once a day for cholesterol  Propranolol as needed  Labwork:  No new labs needed  Testing/Procedures:  No further testing at this time   Follow-Up: It was a pleasure seeing you in the office today. Please call us if you have new issues that need to be addressed before your next appt.  [phone]  Your physician wants you to follow-up in: 12 months.  You will receive a reminder letter in the mail two months in advance. If you don't receive a letter, please call our office to schedule the follow-up appointment.  If you need a refill on your cardiac medications before your next appointment, please call your pharmacy.  For educational health videos Log in to : [email or web address] Or : [email or web address], password : [email or web address]

## 2018-07-06 ENCOUNTER — Inpatient Hospital Stay: Payer: Medicare Other

## 2018-07-06 ENCOUNTER — Encounter: Payer: Self-pay | Admitting: Hematology and Oncology

## 2018-07-06 ENCOUNTER — Other Ambulatory Visit: Payer: Self-pay

## 2018-07-06 ENCOUNTER — Inpatient Hospital Stay (HOSPITAL_BASED_OUTPATIENT_CLINIC_OR_DEPARTMENT_OTHER): Payer: Medicare Other | Admitting: Hematology and Oncology

## 2018-07-06 VITALS — BP 168/76 | HR 57 | Temp 96.6°F | Wt 201.0 lb

## 2018-07-06 DIAGNOSIS — G8929 Other chronic pain: Secondary | ICD-10-CM | POA: Diagnosis not present

## 2018-07-06 DIAGNOSIS — C50412 Malignant neoplasm of upper-outer quadrant of left female breast: Secondary | ICD-10-CM | POA: Diagnosis not present

## 2018-07-06 DIAGNOSIS — M25569 Pain in unspecified knee: Secondary | ICD-10-CM | POA: Diagnosis not present

## 2018-07-06 DIAGNOSIS — Z5111 Encounter for antineoplastic chemotherapy: Secondary | ICD-10-CM | POA: Diagnosis not present

## 2018-07-06 DIAGNOSIS — K029 Dental caries, unspecified: Secondary | ICD-10-CM

## 2018-07-06 DIAGNOSIS — Z9221 Personal history of antineoplastic chemotherapy: Secondary | ICD-10-CM | POA: Diagnosis not present

## 2018-07-06 DIAGNOSIS — M85851 Other specified disorders of bone density and structure, right thigh: Secondary | ICD-10-CM | POA: Diagnosis not present

## 2018-07-06 DIAGNOSIS — K449 Diaphragmatic hernia without obstruction or gangrene: Secondary | ICD-10-CM | POA: Diagnosis not present

## 2018-07-06 DIAGNOSIS — Z170 Estrogen receptor positive status [ER+]: Secondary | ICD-10-CM

## 2018-07-06 DIAGNOSIS — Z853 Personal history of malignant neoplasm of breast: Secondary | ICD-10-CM

## 2018-07-06 DIAGNOSIS — C773 Secondary and unspecified malignant neoplasm of axilla and upper limb lymph nodes: Secondary | ICD-10-CM | POA: Diagnosis not present

## 2018-07-06 DIAGNOSIS — C7951 Secondary malignant neoplasm of bone: Secondary | ICD-10-CM | POA: Diagnosis not present

## 2018-07-06 DIAGNOSIS — K5730 Diverticulosis of large intestine without perforation or abscess without bleeding: Secondary | ICD-10-CM | POA: Diagnosis not present

## 2018-07-06 DIAGNOSIS — Z923 Personal history of irradiation: Secondary | ICD-10-CM | POA: Diagnosis not present

## 2018-07-06 DIAGNOSIS — C50912 Malignant neoplasm of unspecified site of left female breast: Secondary | ICD-10-CM | POA: Diagnosis not present

## 2018-07-06 LAB — COMPREHENSIVE METABOLIC PANEL
ALT: 12 U/L (ref 0–44)
AST: 23 U/L (ref 15–41)
Albumin: 3.8 g/dL (ref 3.5–5.0)
Alkaline Phosphatase: 59 U/L (ref 38–126)
Anion gap: 10 (ref 5–15)
BUN: 27 mg/dL — ABNORMAL HIGH (ref 8–23)
CO2: 25 mmol/L (ref 22–32)
Calcium: 8.9 mg/dL (ref 8.9–10.3)
Chloride: 99 mmol/L (ref 98–111)
Creatinine, Ser: 0.93 mg/dL (ref 0.44–1.00)
GFR calc Af Amer: >60 mL/min (ref >60)
GFR calc non Af Amer: 53 mL/min — ABNORMAL LOW (ref >60)
Glucose, Bld: 100 mg/dL — ABNORMAL HIGH (ref 70–99)
Potassium: 3.8 mmol/L (ref 3.5–5.1)
Sodium: 134 mmol/L — ABNORMAL LOW (ref 135–145)
Total Bilirubin: 0.4 mg/dL (ref 0.3–1.2)
Total Protein: 7.2 g/dL (ref 6.5–8.1)

## 2018-07-06 LAB — CBC WITH DIFFERENTIAL/PLATELET
Basophils Absolute: 0 10*3/uL (ref 0–0.1)
Basophils Relative: 1 %
Eosinophils Absolute: 0.2 10*3/uL (ref 0–0.7)
Eosinophils Relative: 5 %
HCT: 35.4 % (ref 35.0–47.0)
Hemoglobin: 12 g/dL (ref 12.0–16.0)
Lymphocytes Relative: 21 %
Lymphs Abs: 1.1 10*3/uL (ref 1.0–3.6)
MCH: 30.5 pg (ref 26.0–34.0)
MCHC: 33.9 g/dL (ref 32.0–36.0)
MCV: 89.7 fL (ref 80.0–100.0)
Monocytes Absolute: 0.5 10*3/uL (ref 0.2–0.9)
Monocytes Relative: 9 %
Neutro Abs: 3.2 10*3/uL (ref 1.4–6.5)
Neutrophils Relative %: 64 %
Platelets: 212 10*3/uL (ref 150–440)
RBC: 3.95 MIL/uL (ref 3.80–5.20)
RDW: 14.8 % — ABNORMAL HIGH (ref 11.5–14.5)
WBC: 5 10*3/uL (ref 3.6–11.0)

## 2018-07-06 MED ORDER — FULVESTRANT 250 MG/5ML IM SOLN
500.0000 mg | Freq: Once | INTRAMUSCULAR | Status: AC
  Administered 2018-07-06: 500 mg via INTRAMUSCULAR
  Filled 2018-07-06: qty 10

## 2018-07-06 NOTE — Progress Notes (Signed)
Patient complains of having heartburn.  States she also has been given  a new medication for cholesterol (Zetia).

## 2018-07-06 NOTE — Progress Notes (Signed)
[facility] day:  07/06/2018   Chief Complaint: Kelli Williams is a 84 y.o. female with metastatic breast cancer who is seen for a 1 month assessment and continuation of Faslodex and Xgeva.  HPI:  The patient was last seen in the medical oncology clinic on 06/08/2018.  At that time, she described chronic knee pain.  She had discomfort at the site of radiation (lower back on the right side).  Weight was up 3 pounds.  Exam was stable. Calcium was 9.1. PET scan showed improvement in disease.  CA27.29 was 36.6 (improved).  She received Faslodex.  She did not receive Xgeva secondary to a dental issue.  During the interim, she has been "ok".  She states that she is going to Dr. Shon Baton at [facility].  A crown came off.  Her cavity is bigger.  Dr. Candis Musa "ordered another medication".  She denies any breast concerns.  She denies any bone pain.   Past Medical History:  Diagnosis Date  . Anxiety   . Arthritis    BOTH FEET AND KNEES  . Breast cancer ([place]) 2000   left breast ca with lumpectomy and rad tx 5 yr tamoxifen/Dr Jeannine Kitten at [facility]  . Complication of anesthesia    WOKE UP DURING SURGERY FOR DEVIATED SEPTUM, KNEE REPLACEMENT AND BREAST LUMPECTOMY  . Dysrhythmia   . GERD (gastroesophageal reflux disease)   . Hypertension   . Neuropathic pain of both legs   . Neuropathy   . Pneumonia 2016  . Skin cancer of forehead   . Skin cancer of nose     Past Surgical History:  Procedure Laterality Date  . ABDOMINAL HYSTERECTOMY    . AXILLARY LYMPH NODE BIOPSY Left 07/10/2017   INVASIVE MAMMARY CARCINOMA WITH FOCAL LOBULAR FEATURES.   Marland Kitchen BREAST BIOPSY Bilateral 1960   benign  . BREAST LUMPECTOMY Left 2000   breast ca with rad tx  . BREAST LUMPECTOMY Left 08/20/2017  . BREAST LUMPECTOMY Left 08/20/2017   Procedure: left breast wide excision;  Surgeon: Robert Bellow, MD;  Location: [facility] ORS;  Service: General;  Laterality: Left;  . CATARACT  EXTRACTION     left eye  . CHOLECYSTECTOMY    . JOINT REPLACEMENT    . NASAL SEPTUM SURGERY    . PARTIAL KNEE ARTHROPLASTY     right  . TOTAL VAGINAL HYSTERECTOMY      Family History  Problem Relation Age of Onset  . Heart disease Mother   . Heart attack Paternal Uncle   . Breast cancer Daughter 31       genetic negative  . Cancer Paternal Grandmother     Social History:  reports that she has never smoked. She has never used smokeless tobacco. She reports that she does not drink alcohol or use drugs.  The patient's husband died 2 years ago.  She was born outside of [place].  She previously worked as an Web designer.  She lives at the [facility] at [place].  She is independent except for the use of her walker. Patient has a daughter, Kelli Williams.  The patient is alone today.  Allergies:  Allergies  Allergen Reactions  . Amitriptyline Hives  . Amoxicillin     Diarrhea    . Ivp Dye [Iodinated Diagnostic Agents]   . Tape Other (See Comments)    Whelps - please use paper tape.  . Sulfa Antibiotics Rash  . Sulfa Antibiotics Rash    Current  Medications: Current Outpatient Medications  Medication Sig Dispense Refill  . acetaminophen (TYLENOL) 325 MG tablet Take 650 mg by mouth every 6 (six) hours as needed.    Marland Kitchen aspirin 81 MG tablet Take 1 tablet by mouth daily.    . Calcium-Magnesium 500-250 MG TABS Take 2 tablets by mouth daily.     . Coenzyme Q10 (CO Q-10) 100 MG CAPS Take by mouth daily.    . diphenoxylate-atropine (LOMOTIL) 2.5-0.025 MG tablet Take 1 tablet by mouth 4 (four) times daily as needed for diarrhea or loose stools. 30 tablet 0  . ezetimibe (ZETIA) 10 MG tablet Take 1 tablet (10 mg total) by mouth daily. 90 tablet 3  . Multiple Vitamins-Minerals (MULTIVITAMIN WITH MINERALS) tablet Take 1 tablet by mouth daily.    Marland Kitchen nystatin (MYCOSTATIN/NYSTOP) powder Apply topically 4 (four) times daily. (Patient not taking: Reported on 08/06/2018) 15 g 3  .  omeprazole (PRILOSEC) 20 MG capsule TAKE 1 CAPSULE BY MOUTH  DAILY 90 capsule 1  . propranolol (INDERAL) 10 MG tablet Take 1 tablet (10 mg total) by mouth 3 (three) times daily as needed. 90 tablet 3  . traMADol (ULTRAM) 50 MG tablet One half to one tablet every 8 hours as needed pain 90 tablet 0  . gabapentin (NEURONTIN) 300 MG capsule Take 1 capsule (300 mg total) by mouth 2 (two) times daily. 540 capsule 1  . lisinopril-hydrochlorothiazide (PRINZIDE,ZESTORETIC) 20-25 MG tablet Take 1.5 tablets by mouth daily. 135 tablet 3  . meloxicam (MOBIC) 15 MG tablet Take 1 tablet (15 mg total) by mouth daily. 90 tablet 1   No current facility-administered medications for this visit.     Review of Systems  Constitutional: Positive for weight loss (down 2 pounds). Negative for diaphoresis, fever and malaise/fatigue.       Feels "ok".  HENT: Negative for congestion, ear discharge, ear pain, nosebleeds, sinus pain and sore throat.        Dental work ongoing.  Eyes: Negative.  Negative for blurred vision, double vision, photophobia, pain and discharge.  Respiratory: Negative.  Negative for cough, hemoptysis and shortness of breath.   Cardiovascular: Negative.  Negative for chest pain, palpitations, orthopnea, leg swelling and PND.  Gastrointestinal: Negative for abdominal pain, blood in stool, constipation, diarrhea, melena, nausea and vomiting.       Irritable bowel syndrome.  Sigmoid colon diverticulosis. Hiatal hernia.   Genitourinary: Negative.  Negative for dysuria, frequency, hematuria and urgency.  Musculoskeletal: Positive for back pain and joint pain (BILATERAL knee pain). Negative for falls and myalgias.       Chronic right lower back discomfort s/p prior radiation.  Skin: Negative.  Negative for itching and rash.  Neurological: Negative.  Negative for dizziness, tremors, sensory change, speech change, focal weakness, weakness and headaches.  Endo/Heme/Allergies: Negative.  Does not  bruise/bleed easily.  Psychiatric/Behavioral: Negative for depression, memory loss and suicidal ideas. The patient is not nervous/anxious and does not have insomnia.   All other systems reviewed and are negative.  Performance status (ECOG): 1-2  Physical Exam: Blood pressure (!) 168/76, pulse (!) 57, temperature (!) 96.6 F (35.9 C), temperature source Tympanic, weight 201 lb (91.2 kg). GENERAL:  Elderly woman sitting comfortably in the exam room in no acute distress.  She requires help onto the table.  She has a rolling walker by her side. MENTAL STATUS:  Alert and oriented to person, place and time. HEAD:  Short styled gray hair.  Normocephalic, atraumatic, face symmetric, no Cushingoid features.  EYES:  Hazel eyes.  Pupils equal round and reactive to light and accomodation.  No conjunctivitis or scleral icterus. ENT:  Oropharynx clear without lesion.  Tongue normal. Mucous membranes moist.  RESPIRATORY:  Clear to auscultation without rales, wheezes or rhonchi. CARDIOVASCULAR:  Regular rate and rhythm without murmur, rub or gallop. ABDOMEN:  Soft, non-tender, with active bowel sounds, and no hepatosplenomegaly.  No masses. SKIN:  No rashes, ulcers or lesions. EXTREMITIES: Bilateral lower extremity edema.  No skin discoloration or tenderness.  No palpable cords. LYMPH NODES: No palpable cervical, supraclavicular, axillary or inguinal adenopathy  NEUROLOGICAL: Unremarkable. PSYCH:  Appropriate.    No visits with results within 3 Day(s) from this visit.  Latest known visit with results is:  Hospital Outpatient Visit on 07/10/2017  Component Date Value Ref Range Status  . SURGICAL PATHOLOGY 07/10/2017    Final-Edited                   Value:Surgical Pathology THIS IS AN ADDENDUM REPORT CASE: [id number] PATIENT: [facility] Surgical Pathology Report Addendum   Reason for Addendum #1:  Additional clinical/test information  SPECIMEN SUBMITTED: A. Axilla, left  CLINICAL  HISTORY: Left axillary mass in a patient with history of prior ipsilateral breast cancer  PRE-OPERATIVE DIAGNOSIS: Metastatic disease vs second primary cancer  POST-OPERATIVE DIAGNOSIS: None provided.     DIAGNOSIS: A.  LEFT AXILLA; ULTRASOUND-GUIDED NEEDLE CORE BIOPSY: - INVASIVE MAMMARY CARCINOMA WITH FOCAL LOBULAR FEATURES.  Size of invasive carcinoma:  1.1 cm in this sample Histologic grade of invasive carcinoma: Grade 2      Glandular/tubular differentiation score: 3      Nuclear pleomorphism score: 2      Mitotic rate score: 1  ER/PR/HER2: Immunohistochemistry will be performed, with reflex to [place] for HER2 2+ (equivocal) results. The results will be reported in an addendum.  The                          diagnosis was called to Eino Farber of [facility] on 07/11/17 at 227 PM.   GROSS DESCRIPTION:  A. The specimen is received in a formalin-filled container labeled with the patient's name and left axillary mass.  Core pieces: multiple, 2.0 x 0.9 x 0.1 cm Comments: yellow to red lobulated fibrofatty, marked green  Entirely submitted in cassette(s): 1  Time/Date in fixative: placed in formalin at 1:50 PM on 07/10/17 cold ischemic time of less than 5 minutes Total fixation time: 6.5 hours    Final Diagnosis performed by Delorse Lek, MD.  Electronically signed 07/11/2017 2:29:14PM   The electronic signature indicates that the named Attending Pathologist has evaluated the specimen  Technical component performed at [place], [address] [place], [place] [address] Lab: [phone] Dir: Darrick Penna. Evette Doffing, MD  Professional component performed at [facility], [place], [place], [place] [address] Lab: [phone]                          Dir: Dellia Nims. Rubinas, MD   Breast Biomarker Reporting Template  BREAST BIOMARKER TESTS Estrogen Receptor (ER) Status: POSITIVE, >90% nuclear staining      Average  intensity of staining: Strong Progesterone Receptor (PgR) Status: POSITIVE, >90% nuclear staining      Average intensity of staining: Strong HER2 (by immunohistochemistry): EQUIVOCAL, 2+ Percentage of cells with uniform intense complete membrane staining: 0%  METHODS Cold Ischemia and Fixation Times:  Meet requirements specified in latest version of the ASCO/CAP guidelines Testing Performed on Block Number(s): A1 Fixative: Formalin Estrogen Receptor:  FDA cleared ([place])                    Primary Antibody:  SP1 Progesterone Receptor: FDA cleared ([place]entana)                   Primary Antibody: 1E2 HER2 (by immunohistochemistry): FDA approved (DAKO)                             Primary Antibody: HercepTest  Immunohistochemistry controls worked appropriately. Slides were prepared by [facility] for [facility], [place], [place], and interpreted by Dr. Luana Shu.      Addendum #1 performed by Delorse Lek, MD.  Electronically signed 07/15/2017 1:36:03PM    Technical component performed at [place], [address], [place], [place] [address] Lab: [phone] Dir: Darrick Penna. Evette Doffing, MD  Professional component performed at [facility], [facility], [place], [place],  [address] Lab: [phone] Dir: Dellia Nims. Rubinas, MD      Assessment:  MARLETTE CURVIN is a 84 y.o. female with metastatic breast cancer s/p left axillary biopsy on 07/10/2017.  Pathology revealed a 1.1 cm grade II invasive mammary carcinoma with focal lobular features.   Wide excision on 08/20/2017 revealed metastatic mammary carcinoma involving 1 of 4 axillary lymph nodes with extracapsular extension.  The superior medial margin was involved.  Tumor was ER + (> 90%), PR + (> 90%), and Her2/neu 2+ (FISH -).   [facility] genetic testing on 09/26/2017 revealed no pathogenic sequence variants or deletions/duplications.  BRCA1/2 testing on 09/26/2017 was  negative.  She has a history of left breast cancer s/p wide excision and sentinel lymph node biopsy on 07/18/1999 at [facility].  Pathology revealed a 0.8 x 0.6 x 0.4 cm grade II invasive ductal adenocarcinoma.  There was no lymphovascular invasion.  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  Margins were negative.  Tumor was ER + (50%) and PR + (90%).  Pathologic stage was T1bN0.  She received 6200 cGy from 08/21/1999 - 10/03/1999.  She completed 5 years of tamoxifen in 06/2004.    PET scan on 09/16/2017 revealed widespread scattered hypermetabolic lytic osseous metastases throughout the axial and proximal appendicular skeleton.  Bilateral proximal femoral skeletal metastases placed the patient at risk for pathologic hip fractures.  There was mild hypermetabolic right hilar lymphadenopathy.  Plain films of the bilateral femurs on 09/23/2017 revealed no pathologic fracture. The LEFT view showed lytic subtrochanteric metastasis that was previously seen on PET scan.   PET scan on 06/01/2018 that demonstrated marked improvement in the osseous metastatic disease.  Previously demonstrated areas of abnormally high metabolic activity have resolved, with the exception of a very faint area of residual activity (SUV 2.6; previously 11.5) in the LEFT subtrochanteric region. Previous LEFT axillary fluid collection now shows a 1.8 x 1.2 cm scarlike density with low-grade activity (SUV 2.7). Incidental findings include a small hiatal hernia and diverticulosis of the sigmoid colon.   Lumbar spine MRI on 02/11/2018 revealed multifocal lumbosacral vertebral body signal abnormalities consistent with osseous metastatic disease. The largest lesion was at L1.  There was no pathologic compression fracture. There was multi-level moderate to severe  neural foraminal stenosis, which had progressed since the prior study.  CA27.29 has been followed:  14.7 on 02/05/2017, 19.1 on 02/06/2012, 172.2 on 09/04/2017, 144.9 on 11/06/2017,  73.4 on 02/05/2018, 63.3 on 03/06/2018, 47.2 on 04/06/2018, 42.4 on 05/07/2018, 36.6 on 06/08/2018, and 34.6 on 07/06/2018.  She received a palliative course of radiation (3000 cGy in 10 fractions) to the left hip from 10/14/2017 - 10/27/2017.  She received a course of palliative radiation the the lumbar spine and SI joints from 03/04/2018 - 03/27/2018.  She began Faslodex on 09/26/2017 (last 05/07/2017).  She began monthly Xgeva on 09/26/2017 (last 05/07/2018).    Bone density on 09/01/2017 revealed osteopenia with a T-score of -1.9 in the right femoral neck and left forearm. She is on calcium and vitamin D.  Symptomatically, she is doing well.  She denies any bone pain.  She has ongoing dental issues.  Exam is stable.  Plan: 1.  Labs today:  CBC with diff, CMP, CA27.29. 2.  Metastatic breast cancer:    She is doing well with no pain secondary to bone metastasis and declining CA27.29.  Continue monthly Faslodex. 3.  Bone metastasis:    Xgeva on hold secondary to dental issues. 4.  Osteopenia:    Continue calcium ad vitamin D. 5.  Dental issues:  Contact Dr. Shon Baton regarding dental clearance and plan for further Xgeva once cleared from a dental perspective.  Discuss risk of osteonecrosis of the jaw if dental extraction needed and bone has not healed.  Encourage follow-up with dentistry. 6.  RTC in 1 month for MD assessment, labs (CBC with diff, CMP, CA27.29), Faslodex, and +/- Awilda Bill, MD 07/06/2018, 4:35 PM

## 2018-07-07 LAB — CANCER ANTIGEN 27.29: CA 27.29: 34.6 U/mL (ref 0.0–38.6)

## 2018-08-04 ENCOUNTER — Ambulatory Visit (INDEPENDENT_AMBULATORY_CARE_PROVIDER_SITE_OTHER): Payer: Medicare Other | Admitting: Internal Medicine

## 2018-08-04 ENCOUNTER — Encounter: Payer: Self-pay | Admitting: Internal Medicine

## 2018-08-04 VITALS — BP 118/70 | HR 56 | Ht 64.0 in | Wt 199.0 lb

## 2018-08-04 DIAGNOSIS — Z170 Estrogen receptor positive status [ER+]: Secondary | ICD-10-CM

## 2018-08-04 DIAGNOSIS — G5793 Unspecified mononeuropathy of bilateral lower limbs: Secondary | ICD-10-CM | POA: Diagnosis not present

## 2018-08-04 DIAGNOSIS — I10 Essential (primary) hypertension: Secondary | ICD-10-CM

## 2018-08-04 DIAGNOSIS — I2510 Atherosclerotic heart disease of native coronary artery without angina pectoris: Secondary | ICD-10-CM | POA: Diagnosis not present

## 2018-08-04 DIAGNOSIS — C50412 Malignant neoplasm of upper-outer quadrant of left female breast: Secondary | ICD-10-CM | POA: Diagnosis not present

## 2018-08-04 MED ORDER — MELOXICAM 15 MG PO TABS: 15.0000 mg | ORAL_TABLET | Freq: Every day | ORAL | 1 refills | Status: DC

## 2018-08-04 MED ORDER — LISINOPRIL-HYDROCHLOROTHIAZIDE 20-25 MG PO TABS: 1.5000 | ORAL_TABLET | Freq: Every day | ORAL | 3 refills | Status: DC

## 2018-08-04 MED ORDER — GABAPENTIN 300 MG PO CAPS: 300.0000 mg | ORAL_CAPSULE | Freq: Two times a day (BID) | ORAL | 1 refills | Status: DC

## 2018-08-04 NOTE — Progress Notes (Signed)
Date:  08/04/2018   Name:  Kelli Williams   DOB:  1926-11-09   MRN:  [id number]   Chief Complaint: Hypertension Hypertension  This is a chronic problem. The problem is controlled. Pertinent negatives include no chest pain, headaches, palpitations or shortness of breath. Past treatments include ACE inhibitors and diuretics. Hypertensive end-organ damage includes CAD/MI.   Coronary artery calcification - recently started on Zetia and so far no side effects.  Breast cancer - undergoing therapy with Faslodex and she has had regression of her bony lesions.  She feels well but she had gained weight.  Review of Systems  Constitutional: Positive for unexpected weight change. Negative for chills, fatigue and fever.  HENT: Negative for trouble swallowing.   Respiratory: Negative for cough, chest tightness, shortness of breath and wheezing.   Cardiovascular: Positive for leg swelling. Negative for chest pain and palpitations.  Gastrointestinal: Negative for abdominal pain, constipation and diarrhea.  Genitourinary: Negative for dysuria.  Musculoskeletal: Positive for arthralgias and back pain.  Neurological: Negative for dizziness and headaches.  Psychiatric/Behavioral: Negative for dysphoric mood and sleep disturbance.    Patient Active Problem List   Diagnosis Date Noted  . PAD (peripheral artery disease) ([place]) 07/01/2018  . Aortic atherosclerosis ([place]) 07/01/2018  . Coronary artery calcification seen on CT scan 07/01/2018  . Cancer associated pain 02/05/2018  . Goals of care, counseling/discussion 11/24/2017  . Candida infection 11/24/2017  . Esophageal reflux 10/28/2017  . Postoperative seroma of subcutaneous tissue after non-dermatologic procedure 10/22/2017  . Bone metastases ([place]) 09/23/2017  . Osteopenia 09/08/2017  . Breast cancer of upper-outer quadrant of left female breast ([place]) 07/10/2017  . Irritable bowel syndrome without diarrhea 06/27/2017  . Oral lesion 06/27/2017    . BMI 33.0-33.9,adult 06/27/2017  . OA (osteoarthritis) of knee 10/16/2016  . Leg swelling 10/11/2015  . Neuropathy involving both lower extremities 06/19/2015  . Secondary localized osteoarthrosis, ankle and foot 06/01/2015  . Acne erythematosa 06/01/2015  . Avitaminosis D 06/01/2015  . Atrial tachycardia ([place]) 08/24/2014  . Essential hypertension 08/17/2012  . History of breast cancer 08/17/2012    Allergies  Allergen Reactions  . Amitriptyline Hives  . Amoxicillin     Diarrhea    . Ivp Dye [Iodinated Diagnostic Agents]   . Tape Other (See Comments)    Whelps - please use paper tape.  . Sulfa Antibiotics Rash  . Sulfa Antibiotics Rash    Past Surgical History:  Procedure Laterality Date  . ABDOMINAL HYSTERECTOMY    . AXILLARY LYMPH NODE BIOPSY Left 07/10/2017   INVASIVE MAMMARY CARCINOMA WITH FOCAL LOBULAR FEATURES.   Marland Kitchen BREAST BIOPSY Bilateral 1960   benign  . BREAST LUMPECTOMY Left 2000   breast ca with rad tx  . BREAST LUMPECTOMY Left 08/20/2017  . BREAST LUMPECTOMY Left 08/20/2017   Procedure: left breast wide excision;  Surgeon: Robert Bellow, MD;  Location: [facility] ORS;  Service: General;  Laterality: Left;  . CATARACT EXTRACTION     left eye  . CHOLECYSTECTOMY    . JOINT REPLACEMENT    . NASAL SEPTUM SURGERY    . PARTIAL KNEE ARTHROPLASTY     right  . TOTAL VAGINAL HYSTERECTOMY      Social History   Tobacco Use  . Smoking status: Never Smoker  . Smokeless tobacco: Never Used  . Tobacco comment: smoking cessation materials not required  Substance Use Topics  . Alcohol use: No    Alcohol/week: 0.0 standard drinks  . Drug  use: No     Medication list has been reviewed and updated.  Current Meds  Medication Sig  . acetaminophen (TYLENOL) 325 MG tablet Take 650 mg by mouth every 6 (six) hours as needed.  Marland Kitchen aspirin 81 MG tablet Take 1 tablet by mouth daily.  . Calcium-Magnesium 500-250 MG TABS Take 2 tablets by mouth daily.   . Coenzyme Q10 (CO  Q-10) 100 MG CAPS Take by mouth daily.  . diphenoxylate-atropine (LOMOTIL) 2.5-0.025 MG tablet Take 1 tablet by mouth 4 (four) times daily as needed for diarrhea or loose stools.  . ezetimibe (ZETIA) 10 MG tablet Take 1 tablet (10 mg total) by mouth daily.  Marland Kitchen gabapentin (NEURONTIN) 300 MG capsule TAKE 3 CAPSULES BY MOUTH  TWO TIMES DAILY  . lisinopril-hydrochlorothiazide (PRINZIDE,ZESTORETIC) 20-25 MG tablet TAKE 1 AND 1/2 TABLETS BY  MOUTH DAILY  . meloxicam (MOBIC) 15 MG tablet TAKE 1 TABLET BY MOUTH  DAILY  . Multiple Vitamins-Minerals (MULTIVITAMIN WITH MINERALS) tablet Take 1 tablet by mouth daily.  Marland Kitchen nystatin (MYCOSTATIN/NYSTOP) powder Apply topically 4 (four) times daily. (Patient taking differently: Apply topically 4 (four) times daily as needed. )  . omeprazole (PRILOSEC) 20 MG capsule TAKE 1 CAPSULE BY MOUTH  DAILY  . propranolol (INDERAL) 10 MG tablet Take 1 tablet (10 mg total) by mouth 3 (three) times daily as needed.  . traMADol (ULTRAM) 50 MG tablet One half to one tablet every 8 hours as needed pain    PHQ 2/9 Scores 06/29/2018 06/27/2017 10/16/2016 06/19/2016  PHQ - 2 Score 0 2 0 0  PHQ- 9 Score 0 2 - -    Physical Exam  Constitutional: She is oriented to person, place, and time. She appears well-developed. No distress.  HENT:  Head: Normocephalic and atraumatic.  Neck: Normal range of motion. Neck supple.  Cardiovascular: Normal rate, regular rhythm and normal heart sounds.  Pulmonary/Chest: Effort normal and breath sounds normal. No respiratory distress.  Musculoskeletal: She exhibits edema.  Neurological: She is alert and oriented to person, place, and time.  Skin: Skin is warm and dry. No rash noted.  Psychiatric: She has a normal mood and affect. Her behavior is normal. Thought content normal.  Nursing note and vitals reviewed.   BP 118/70 (BP Location: Right Arm, Patient Position: Sitting, Cuff Size: Normal)   Pulse (!) 56   Ht 5\' 4"  (1.626 m)   Wt 199 lb (90.3  kg)   SpO2 96%   BMI 34.16 kg/m   Assessment and Plan: 1. Neuropathy involving both lower extremities Stable, continue current medication - meloxicam (MOBIC) 15 MG tablet; Take 1 tablet (15 mg total) by mouth daily.  Dispense: 90 tablet; Refill: 1 - gabapentin (NEURONTIN) 300 MG capsule; Take 1 capsule (300 mg total) by mouth 2 (two) times daily.  Dispense: 540 capsule; Refill: 1  2. Essential hypertension controlled - lisinopril-hydrochlorothiazide (PRINZIDE,ZESTORETIC) 20-25 MG tablet; Take 1.5 tablets by mouth daily.  Dispense: 135 tablet; Refill: 3  3. Coronary artery calcification seen on CT scan Now on zetia Follow up with OV and labs in 4-6 months  4. Malignant neoplasm of upper-outer quadrant of left breast in female, estrogen receptor positive ([place]) Followed by Oncology   Meds ordered this encounter  Medications  . meloxicam (MOBIC) 15 MG tablet    Sig: Take 1 tablet (15 mg total) by mouth daily.    Dispense:  90 tablet    Refill:  1  . lisinopril-hydrochlorothiazide (PRINZIDE,ZESTORETIC) 20-25 MG tablet  Sig: Take 1.5 tablets by mouth daily.    Dispense:  135 tablet    Refill:  3  . gabapentin (NEURONTIN) 300 MG capsule    Sig: Take 1 capsule (300 mg total) by mouth 2 (two) times daily.    Dispense:  540 capsule    Refill:  1    Partially dictated using Editor, commissioning. Any errors are unintentional.  Halina Maidens, MD [facility]  08/04/2018

## 2018-08-06 ENCOUNTER — Inpatient Hospital Stay: Payer: Medicare Other

## 2018-08-06 ENCOUNTER — Encounter: Payer: Self-pay | Admitting: Hematology and Oncology

## 2018-08-06 ENCOUNTER — Inpatient Hospital Stay (HOSPITAL_BASED_OUTPATIENT_CLINIC_OR_DEPARTMENT_OTHER): Payer: Medicare Other | Admitting: Hematology and Oncology

## 2018-08-06 ENCOUNTER — Inpatient Hospital Stay: Payer: Medicare Other | Attending: Hematology and Oncology

## 2018-08-06 VITALS — BP 138/80 | HR 76 | Temp 97.8°F | Resp 18 | Wt 199.0 lb

## 2018-08-06 DIAGNOSIS — G629 Polyneuropathy, unspecified: Secondary | ICD-10-CM

## 2018-08-06 DIAGNOSIS — Z85828 Personal history of other malignant neoplasm of skin: Secondary | ICD-10-CM

## 2018-08-06 DIAGNOSIS — Z5111 Encounter for antineoplastic chemotherapy: Secondary | ICD-10-CM | POA: Diagnosis not present

## 2018-08-06 DIAGNOSIS — M85851 Other specified disorders of bone density and structure, right thigh: Secondary | ICD-10-CM | POA: Diagnosis not present

## 2018-08-06 DIAGNOSIS — I10 Essential (primary) hypertension: Secondary | ICD-10-CM

## 2018-08-06 DIAGNOSIS — C7951 Secondary malignant neoplasm of bone: Secondary | ICD-10-CM | POA: Diagnosis not present

## 2018-08-06 DIAGNOSIS — Z170 Estrogen receptor positive status [ER+]: Secondary | ICD-10-CM

## 2018-08-06 DIAGNOSIS — C773 Secondary and unspecified malignant neoplasm of axilla and upper limb lymph nodes: Secondary | ICD-10-CM

## 2018-08-06 DIAGNOSIS — C50412 Malignant neoplasm of upper-outer quadrant of left female breast: Secondary | ICD-10-CM

## 2018-08-06 DIAGNOSIS — C50912 Malignant neoplasm of unspecified site of left female breast: Secondary | ICD-10-CM | POA: Diagnosis not present

## 2018-08-06 LAB — COMPREHENSIVE METABOLIC PANEL
ALT: 13 U/L (ref 0–44)
AST: 20 U/L (ref 15–41)
Albumin: 4 g/dL (ref 3.5–5.0)
Alkaline Phosphatase: 54 U/L (ref 38–126)
Anion gap: 5 (ref 5–15)
BUN: 23 mg/dL (ref 8–23)
CO2: 30 mmol/L (ref 22–32)
Calcium: 9.2 mg/dL (ref 8.9–10.3)
Chloride: 99 mmol/L (ref 98–111)
Creatinine, Ser: 0.83 mg/dL (ref 0.44–1.00)
GFR calc Af Amer: >60 mL/min (ref >60)
GFR calc non Af Amer: >60 mL/min (ref >60)
Glucose, Bld: 101 mg/dL — ABNORMAL HIGH (ref 70–99)
Potassium: 3.8 mmol/L (ref 3.5–5.1)
Sodium: 134 mmol/L — ABNORMAL LOW (ref 135–145)
Total Bilirubin: 0.5 mg/dL (ref 0.3–1.2)
Total Protein: 7.1 g/dL (ref 6.5–8.1)

## 2018-08-06 LAB — CBC WITH DIFFERENTIAL/PLATELET
Basophils Absolute: 0 10*3/uL (ref 0–0.1)
Basophils Relative: 1 %
Eosinophils Absolute: 0.2 10*3/uL (ref 0–0.7)
Eosinophils Relative: 5 %
HCT: 35.3 % (ref 35.0–47.0)
Hemoglobin: 12.1 g/dL (ref 12.0–16.0)
Lymphocytes Relative: 20 %
Lymphs Abs: 1 10*3/uL (ref 1.0–3.6)
MCH: 30.5 pg (ref 26.0–34.0)
MCHC: 34.3 g/dL (ref 32.0–36.0)
MCV: 88.9 fL (ref 80.0–100.0)
Monocytes Absolute: 0.5 10*3/uL (ref 0.2–0.9)
Monocytes Relative: 10 %
Neutro Abs: 3.3 10*3/uL (ref 1.4–6.5)
Neutrophils Relative %: 64 %
Platelets: 198 10*3/uL (ref 150–440)
RBC: 3.97 MIL/uL (ref 3.80–5.20)
RDW: 13.7 % (ref 11.5–14.5)
WBC: 5.1 10*3/uL (ref 3.6–11.0)

## 2018-08-06 MED ORDER — FULVESTRANT 250 MG/5ML IM SOLN
500.0000 mg | Freq: Once | INTRAMUSCULAR | Status: AC
  Administered 2018-08-06: 500 mg via INTRAMUSCULAR

## 2018-08-06 NOTE — Progress Notes (Addendum)
[facility] day:  08/06/2018   Chief Complaint: Kelli Williams is a 84 y.o. female with metastatic breast cancer who is seen for a 1 month assessment and continuation of Faslodex and Xgeva.  HPI:  The patient was last seen in the medical oncology clinic on 07/06/2018.  At that time, she was doing "ok".  She had ongoing dental issues.  She received Faslodex.  Delton See was held.  During the interim, she has felt "good, I guess".  She saw Dr. Army Melia for her yearly check up on 08/04/2018.  Notes reviewed.  She continued on meloxicam for her neuropathy.  Hypertension was controlled.  Discussions were held regarding the influenza vaccine.  She states that she has an appointment with the dentist on 09/18/219.  She states that her gold cap fell off again.  She denies any dental pain.  She does note some residual lower back pain.   Past Medical History:  Diagnosis Date  . Anxiety   . Arthritis    BOTH FEET AND KNEES  . Breast cancer ([place]) 2000   left breast ca with lumpectomy and rad tx 5 yr tamoxifen/Dr Jeannine Kitten at [facility]  . Complication of anesthesia    WOKE UP DURING SURGERY FOR DEVIATED SEPTUM, KNEE REPLACEMENT AND BREAST LUMPECTOMY  . Dysrhythmia   . GERD (gastroesophageal reflux disease)   . Hypertension   . Neuropathic pain of both legs   . Neuropathy   . Pneumonia 2016  . Skin cancer of forehead   . Skin cancer of nose     Past Surgical History:  Procedure Laterality Date  . ABDOMINAL HYSTERECTOMY    . AXILLARY LYMPH NODE BIOPSY Left 07/10/2017   INVASIVE MAMMARY CARCINOMA WITH FOCAL LOBULAR FEATURES.   Marland Kitchen BREAST BIOPSY Bilateral 1960   benign  . BREAST LUMPECTOMY Left 2000   breast ca with rad tx  . BREAST LUMPECTOMY Left 08/20/2017  . BREAST LUMPECTOMY Left 08/20/2017   Procedure: left breast wide excision;  Surgeon: Robert Bellow, MD;  Location: [facility] ORS;  Service: General;  Laterality: Left;  . CATARACT EXTRACTION     left eye   . CHOLECYSTECTOMY    . JOINT REPLACEMENT    . NASAL SEPTUM SURGERY    . PARTIAL KNEE ARTHROPLASTY     right  . TOTAL VAGINAL HYSTERECTOMY      Family History  Problem Relation Age of Onset  . Heart disease Mother   . Heart attack Paternal Uncle   . Breast cancer Daughter 24       genetic negative  . Cancer Paternal Grandmother     Social History:  reports that she has never smoked. She has never used smokeless tobacco. She reports that she does not drink alcohol or use drugs.  The patient's husband died 2 years ago.  She was born outside of [place].  She previously worked as an Web designer.  She lives at the [facility] at [place].  She is independent except for the use of her walker. Patient has a daughter, Jessina Marse.  The patient is alone today.  Allergies:  Allergies  Allergen Reactions  . Amitriptyline Hives  . Amoxicillin     Diarrhea    . Ivp Dye [Iodinated Diagnostic Agents]   . Tape Other (See Comments)    [place] - please use paper tape.  . Sulfa Antibiotics Rash  . Sulfa Antibiotics Rash    Current Medications: Current Outpatient Medications  Medication Sig  Dispense Refill  . acetaminophen (TYLENOL) 325 MG tablet Take 650 mg by mouth every 6 (six) hours as needed.    Marland Kitchen aspirin 81 MG tablet Take 1 tablet by mouth daily.    . Calcium-Magnesium 500-250 MG TABS Take 2 tablets by mouth daily.     . Coenzyme Q10 (CO Q-10) 100 MG CAPS Take by mouth daily.    . diphenoxylate-atropine (LOMOTIL) 2.5-0.025 MG tablet Take 1 tablet by mouth 4 (four) times daily as needed for diarrhea or loose stools. 30 tablet 0  . ezetimibe (ZETIA) 10 MG tablet Take 1 tablet (10 mg total) by mouth daily. 90 tablet 3  . gabapentin (NEURONTIN) 300 MG capsule Take 1 capsule (300 mg total) by mouth 2 (two) times daily. 540 capsule 1  . lisinopril-hydrochlorothiazide (PRINZIDE,ZESTORETIC) 20-25 MG tablet Take 1.5 tablets by mouth daily. 135 tablet 3  . meloxicam (MOBIC) 15 MG  tablet Take 1 tablet (15 mg total) by mouth daily. 90 tablet 1  . Multiple Vitamins-Minerals (MULTIVITAMIN WITH MINERALS) tablet Take 1 tablet by mouth daily.    Marland Kitchen omeprazole (PRILOSEC) 20 MG capsule TAKE 1 CAPSULE BY MOUTH  DAILY 90 capsule 1  . propranolol (INDERAL) 10 MG tablet Take 1 tablet (10 mg total) by mouth 3 (three) times daily as needed. 90 tablet 3  . traMADol (ULTRAM) 50 MG tablet One half to one tablet every 8 hours as needed pain 90 tablet 0  . nystatin (MYCOSTATIN/NYSTOP) powder Apply topically 4 (four) times daily. (Patient not taking: Reported on 08/06/2018) 15 g 3   No current facility-administered medications for this visit.     Review of Systems  Constitutional: Positive for weight loss (2 pounds). Negative for chills, diaphoresis and malaise/fatigue.       Feels "good".  HENT: Negative.  Negative for congestion, ear discharge, ear pain, nosebleeds, sinus pain and sore throat.        Dental issues  Eyes: Negative.  Negative for blurred vision, double vision, photophobia, pain and discharge.  Respiratory: Negative.  Negative for cough, hemoptysis and sputum production.   Cardiovascular: Positive for leg swelling (chronic). Negative for chest pain, palpitations and orthopnea.  Gastrointestinal: Negative for abdominal pain, blood in stool, constipation, diarrhea, melena, nausea and vomiting.       Irritable bowel syndrome (pebbles or loose).  Sigmoid colon diverticulosis. Hiatal hernia.   Genitourinary: Negative.  Negative for dysuria, frequency, hematuria and urgency.  Musculoskeletal: Positive for back pain and joint pain (BILATERAL knee pain). Negative for falls, myalgias and neck pain.       Lower back discomfort, residual, s/p prior radiation.  Skin: Negative.  Negative for itching and rash.  Neurological: Negative for dizziness, tremors, speech change, focal weakness, weakness and headaches.  Endo/Heme/Allergies: Does not bruise/bleed easily.  Psychiatric/Behavioral:  Negative for depression and memory loss. The patient is not nervous/anxious and does not have insomnia.   All other systems reviewed and are negative.  Performance status (ECOG): 2  Physical Exam: Blood pressure 138/80, pulse 76, temperature 97.8 F (36.6 C), temperature source Tympanic, resp. rate 18, weight 199 lb (90.3 kg). GENERAL:  Elderly woman sitting comfortably in the exam room in no acute distress.  She has a rolling walker by her side. MENTAL STATUS:  Alert and oriented to person, place and time. HEAD:  Short styled gray hair.  Normocephalic, atraumatic, face symmetric, no Cushingoid features. EYES:  Hazel eyes.  Pupils equal round and reactive to light and accomodation.  No conjunctivitis or scleral  icterus. ENT:  Oropharynx clear without lesion.  Tongue normal. Mucous membranes moist.  RESPIRATORY:  Clear to auscultation without rales, wheezes or rhonchi. CARDIOVASCULAR:  Regular rate and rhythm without murmur, rub or gallop. ABDOMEN:  Soft, non-tender, with active bowel sounds, and no hepatosplenomegaly.  No masses. SKIN:  No rashes, ulcers or lesions. EXTREMITIES: Bilateral chronic lower extremity edema.  No skin discoloration or tenderness.  No palpable cords. LYMPH NODES: No palpable cervical, supraclavicular, axillary or inguinal adenopathy  NEUROLOGICAL: Unremarkable. PSYCH:  Appropriate.    No visits with results within 3 Day(s) from this visit.  Latest known visit with results is:  Hospital Outpatient Visit on 07/10/2017  Component Date Value Ref Range Status  . SURGICAL PATHOLOGY 07/10/2017    Final-Edited                   Value:Surgical Pathology THIS IS AN ADDENDUM REPORT CASE: [id number] PATIENT: [facility] Surgical Pathology Report Addendum   Reason for Addendum #1:  Additional clinical/test information  SPECIMEN SUBMITTED: A. Axilla, left  CLINICAL HISTORY: Left axillary mass in a patient with history of prior ipsilateral  breast cancer  PRE-OPERATIVE DIAGNOSIS: Metastatic disease vs second primary cancer  POST-OPERATIVE DIAGNOSIS: None provided.     DIAGNOSIS: A.  LEFT AXILLA; ULTRASOUND-GUIDED NEEDLE CORE BIOPSY: - INVASIVE MAMMARY CARCINOMA WITH FOCAL LOBULAR FEATURES.  Size of invasive carcinoma:  1.1 cm in this sample Histologic grade of invasive carcinoma: Grade 2      Glandular/tubular differentiation score: 3      Nuclear pleomorphism score: 2      Mitotic rate score: 1  ER/PR/HER2: Immunohistochemistry will be performed, with reflex to [place] for HER2 2+ (equivocal) results. The results will be reported in an addendum.  The                          diagnosis was called to Eino Farber of [facility] on 07/11/17 at 227 PM.   GROSS DESCRIPTION:  A. The specimen is received in a formalin-filled container labeled with the patient's name and left axillary mass.  Core pieces: multiple, 2.0 x 0.9 x 0.1 cm Comments: yellow to red lobulated fibrofatty, marked green  Entirely submitted in cassette(s): 1  Time/Date in fixative: placed in formalin at 1:50 PM on 07/10/17 cold ischemic time of less than 5 minutes Total fixation time: 6.5 hours    Final Diagnosis performed by Delorse Lek, MD.  Electronically signed 07/11/2017 2:29:14PM   The electronic signature indicates that the named Attending Pathologist has evaluated the specimen  Technical component performed at [facility], [address] [place], [place] [address] Lab: [phone] Dir: Darrick Penna. Evette Doffing, MD  Professional component performed at [facility], [facility], [place], [place], [place] [address] Lab: [phone]                          Dir: Dellia Nims. Rubinas, MD   Breast Biomarker Reporting Template  BREAST BIOMARKER TESTS Estrogen Receptor (ER) Status: POSITIVE, >90% nuclear staining      Average intensity of staining: Strong Progesterone Receptor (PgR) Status: POSITIVE,  >90% nuclear staining      Average intensity of staining: Strong HER2 (by immunohistochemistry): EQUIVOCAL, 2+ Percentage of cells with uniform intense complete membrane staining: 0%  METHODS Cold Ischemia and Fixation Times: Meet requirements specified in latest version of the ASCO/CAP guidelines Testing Performed on Block Number(s): A1 Fixative: Formalin  Estrogen Receptor:  FDA cleared ([place])                    Primary Antibody:  SP1 Progesterone Receptor: FDA cleared ([place])                   Primary Antibody: 1E2 HER2 (by immunohistochemistry): FDA approved (DAKO)                             Primary Antibody: HercepTest  Immunohistochemistry controls worked appropriately. Slides were prepared by [facility] for [facility] Biology and Pathology, [place], [place], and interpreted by Dr. Luana Shu.      Addendum #1 performed by Delorse Lek, MD.  Electronically signed 07/15/2017 1:36:03PM    Technical component performed at [facility], [address] [place], [place] [address] Lab: [phone] Dir: Darrick Penna. Evette Doffing, MD  Professional component performed at [facility], [facility], [place], [place], [place] [address] Lab: [phone] Dir: Dellia Nims. Rubinas, MD      Assessment:  NIKELLE MALATESTA is a 84 y.o. female with metastatic breast cancer s/p left axillary biopsy on 07/10/2017.  Pathology revealed a 1.1 cm grade II invasive mammary carcinoma with focal lobular features.   Wide excision on 08/20/2017 revealed metastatic mammary carcinoma involving 1 of 4 axillary lymph nodes with extracapsular extension.  The superior medial margin was involved.  Tumor was ER + (> 90%), PR + (> 90%), and Her2/neu 2+ (FISH -).   [facility] genetic testing on 09/26/2017 revealed no pathogenic sequence variants or deletions/duplications.  BRCA1/2 testing on 09/26/2017 was negative.  She has a history of left breast cancer s/p wide excision and sentinel  lymph node biopsy on 07/18/1999 at [facility].  Pathology revealed a 0.8 x 0.6 x 0.4 cm grade II invasive ductal adenocarcinoma.  There was no lymphovascular invasion.  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  Margins were negative.  Tumor was ER + (50%) and PR + (90%).  Pathologic stage was T1bN0.  She received 6200 cGy from 08/21/1999 - 10/03/1999.  She completed 5 years of tamoxifen in 06/2004.    PET scan on 09/16/2017 revealed widespread scattered hypermetabolic lytic osseous metastases throughout the axial and proximal appendicular skeleton.  Bilateral proximal femoral skeletal metastases placed the patient at risk for pathologic hip fractures.  There was mild hypermetabolic right hilar lymphadenopathy.  Plain films of the bilateral femurs on 09/23/2017 revealed no pathologic fracture. The LEFT view showed lytic subtrochanteric metastasis that was previously seen on PET scan.   PET scan on 06/01/2018 that demonstrated marked improvement in the osseous metastatic disease.  Previously demonstrated areas of abnormally high metabolic activity have resolved, with the exception of a very faint area of residual activity (SUV 2.6; previously 11.5) in the LEFT subtrochanteric region. Previous LEFT axillary fluid collection now shows a 1.8 x 1.2 cm scarlike density with low-grade activity (SUV 2.7). Incidental findings include a small hiatal hernia and diverticulosis of the sigmoid colon.   Lumbar spine MRI on 02/11/2018 revealed multifocal lumbosacral vertebral body signal abnormalities consistent with osseous metastatic disease. The largest lesion was at L1.  There was no pathologic compression fracture. There was multi-level moderate to severe neural foraminal stenosis, which had progressed since the prior study.  CA27.29 has been followed:  14.7 on  02/05/2017, 19.1 on 02/06/2012, 172.2 on 09/04/2017, 144.9 on 11/06/2017, 73.4 on 02/05/2018, 63.3 on 03/06/2018, 47.2 on 04/06/2018, 42.4 on 05/07/2018,  36.6 on 06/08/2018, 34.6 on 07/06/2018, and 27.1 on 08/06/2018.  She received a palliative course of radiation (3000 cGy in 10 fractions) to the left hip from 10/14/2017 - 10/27/2017.  She received a course of palliative radiation the the lumbar spine and SI joints from 03/04/2018 - 03/27/2018.  She began Faslodex on 09/26/2017 (last 07/06/2018).  She began monthly Xgeva on 09/26/2017 (last 05/07/2018).    Bone density on 09/01/2017 revealed osteopenia with a T-score of -1.9 in the right femoral neck and left forearm. She is on calcium and vitamin D.  Symptomatically, she is doing well.  She has some residual lower back discomfort s/p radiation.  She has an upcoming dental appointment.  Exam is stable.  Plan: 1.   Labs today:  CBC with diff, CMP, CA27.29. 2.   Metastatic breast cancer:  Faslodex today.  Clinically doing well.    Tumor marker continues to decline monthly.  Anticipate follow-up PET scan 12/01/2018. 3.  Bone metastasis:  Dental issues.  Hold Xgeva today.  Continue calcium and vitamin D. 4.  RTC in 4 weeks for MD assessment, labs (CBC with diff, CMP, CA27.29), Faslodex, and +/- Xgeva.   Nolon Stalls, MD 08/06/2018, 3:09 PM

## 2018-08-06 NOTE — Progress Notes (Signed)
Pt in for follow up, denies any difficulties or concerns today. No pain currently but reports intermittent pain in back and left hip daily.

## 2018-08-07 LAB — CANCER ANTIGEN 27.29: CA 27.29: 27.1 U/mL (ref 0.0–38.6)

## 2018-08-10 DIAGNOSIS — K029 Dental caries, unspecified: Secondary | ICD-10-CM | POA: Insufficient documentation

## 2018-09-03 ENCOUNTER — Ambulatory Visit: Payer: Medicare Other | Admitting: Hematology and Oncology

## 2018-09-03 ENCOUNTER — Other Ambulatory Visit: Payer: Medicare Other

## 2018-09-03 ENCOUNTER — Encounter: Payer: Self-pay | Admitting: Hematology and Oncology

## 2018-09-03 ENCOUNTER — Ambulatory Visit: Payer: Medicare Other

## 2018-09-10 ENCOUNTER — Inpatient Hospital Stay: Payer: Medicare Other

## 2018-09-10 ENCOUNTER — Inpatient Hospital Stay: Payer: Medicare Other | Attending: Hematology and Oncology | Admitting: Hematology and Oncology

## 2018-09-10 ENCOUNTER — Other Ambulatory Visit: Payer: Medicare Other

## 2018-09-10 ENCOUNTER — Encounter: Payer: Self-pay | Admitting: Hematology and Oncology

## 2018-09-10 VITALS — BP 132/86 | HR 55 | Temp 97.6°F | Resp 18 | Wt 200.4 lb

## 2018-09-10 DIAGNOSIS — I10 Essential (primary) hypertension: Secondary | ICD-10-CM

## 2018-09-10 DIAGNOSIS — Z170 Estrogen receptor positive status [ER+]: Secondary | ICD-10-CM

## 2018-09-10 DIAGNOSIS — C50412 Malignant neoplasm of upper-outer quadrant of left female breast: Secondary | ICD-10-CM

## 2018-09-10 DIAGNOSIS — Z23 Encounter for immunization: Secondary | ICD-10-CM | POA: Insufficient documentation

## 2018-09-10 DIAGNOSIS — M533 Sacrococcygeal disorders, not elsewhere classified: Secondary | ICD-10-CM

## 2018-09-10 DIAGNOSIS — M85851 Other specified disorders of bone density and structure, right thigh: Secondary | ICD-10-CM

## 2018-09-10 DIAGNOSIS — C7951 Secondary malignant neoplasm of bone: Secondary | ICD-10-CM | POA: Diagnosis not present

## 2018-09-10 DIAGNOSIS — Z5111 Encounter for antineoplastic chemotherapy: Secondary | ICD-10-CM | POA: Diagnosis not present

## 2018-09-10 DIAGNOSIS — Z85828 Personal history of other malignant neoplasm of skin: Secondary | ICD-10-CM

## 2018-09-10 LAB — CBC WITH DIFFERENTIAL/PLATELET
Basophils Absolute: 0 10*3/uL (ref 0–0.1)
Basophils Relative: 1 %
Eosinophils Absolute: 0.3 10*3/uL (ref 0–0.7)
Eosinophils Relative: 5 %
HCT: 35.9 % (ref 35.0–47.0)
Hemoglobin: 12.1 g/dL (ref 12.0–16.0)
Lymphocytes Relative: 17 %
Lymphs Abs: 0.9 10*3/uL — ABNORMAL LOW (ref 1.0–3.6)
MCH: 30.1 pg (ref 26.0–34.0)
MCHC: 33.7 g/dL (ref 32.0–36.0)
MCV: 89.2 fL (ref 80.0–100.0)
Monocytes Absolute: 0.5 10*3/uL (ref 0.2–0.9)
Monocytes Relative: 9 %
Neutro Abs: 3.6 10*3/uL (ref 1.4–6.5)
Neutrophils Relative %: 68 %
Platelets: 189 10*3/uL (ref 150–440)
RBC: 4.02 MIL/uL (ref 3.80–5.20)
RDW: 13.7 % (ref 11.5–14.5)
WBC: 5.3 10*3/uL (ref 3.6–11.0)

## 2018-09-10 LAB — COMPREHENSIVE METABOLIC PANEL
ALT: 13 U/L (ref 0–44)
AST: 21 U/L (ref 15–41)
Albumin: 3.8 g/dL (ref 3.5–5.0)
Alkaline Phosphatase: 54 U/L (ref 38–126)
Anion gap: 8 (ref 5–15)
BUN: 22 mg/dL (ref 8–23)
CO2: 28 mmol/L (ref 22–32)
Calcium: 9.1 mg/dL (ref 8.9–10.3)
Chloride: 99 mmol/L (ref 98–111)
Creatinine, Ser: 0.82 mg/dL (ref 0.44–1.00)
GFR calc Af Amer: >60 mL/min (ref >60)
GFR calc non Af Amer: >60 mL/min (ref >60)
Glucose, Bld: 104 mg/dL — ABNORMAL HIGH (ref 70–99)
Potassium: 4 mmol/L (ref 3.5–5.1)
Sodium: 135 mmol/L (ref 135–145)
Total Bilirubin: 0.5 mg/dL (ref 0.3–1.2)
Total Protein: 7 g/dL (ref 6.5–8.1)

## 2018-09-10 MED ORDER — INFLUENZA VAC SPLIT HIGH-DOSE 0.5 ML IM SUSY
0.5000 mL | PREFILLED_SYRINGE | Freq: Once | INTRAMUSCULAR | Status: AC
  Administered 2018-09-10: 0.5 mL via INTRAMUSCULAR
  Filled 2018-09-10: qty 0.5

## 2018-09-10 MED ORDER — FULVESTRANT 250 MG/5ML IM SOLN
500.0000 mg | Freq: Once | INTRAMUSCULAR | Status: AC
  Administered 2018-09-10: 500 mg via INTRAMUSCULAR
  Filled 2018-09-10: qty 10

## 2018-09-10 NOTE — Progress Notes (Signed)
Pt in for 4 week follow up and injections.

## 2018-09-10 NOTE — Progress Notes (Signed)
[facility] day:  09/10/2018   Chief Complaint: Kelli Williams is a 84 y.o. female with metastatic breast cancer who is seen for a 1 month assessment and continuation of Faslodex and Xgeva.  HPI:  The patient was last seen in the medical oncology clinic on 08/06/2018.  At that time, she was doing well.  She had some residual lower back discomfort s/p radiation.  She had an upcoming dental appointment.  Her crown had fallen off.  Exam was stable.  She received Faslodex.  Delton See was held.  During the interim, she has felt lousy because of "low back" pain.  Pain at times goes into her hip.  She notes a constant ache sensation, although at times she has a "stabbing sensation" in bed.  She has decided not to go back to her dentist, Dr Shon Baton.  She would like to go to another dentist or endodontist.   Past Medical History:  Diagnosis Date  . Anxiety   . Arthritis    BOTH FEET AND KNEES  . Breast cancer ([place]) 2000   left breast ca with lumpectomy and rad tx 5 yr tamoxifen/Dr Jeannine Kitten at [facility]  . Complication of anesthesia    WOKE UP DURING SURGERY FOR DEVIATED SEPTUM, KNEE REPLACEMENT AND BREAST LUMPECTOMY  . Dysrhythmia   . GERD (gastroesophageal reflux disease)   . Hypertension   . Neuropathic pain of both legs   . Neuropathy   . Pneumonia 2016  . Skin cancer of forehead   . Skin cancer of nose     Past Surgical History:  Procedure Laterality Date  . ABDOMINAL HYSTERECTOMY    . AXILLARY LYMPH NODE BIOPSY Left 07/10/2017   INVASIVE MAMMARY CARCINOMA WITH FOCAL LOBULAR FEATURES.   Marland Kitchen BREAST BIOPSY Bilateral 1960   benign  . BREAST LUMPECTOMY Left 2000   breast ca with rad tx  . BREAST LUMPECTOMY Left 08/20/2017  . BREAST LUMPECTOMY Left 08/20/2017   Procedure: left breast wide excision;  Surgeon: Robert Bellow, MD;  Location: [facility] ORS;  Service: General;  Laterality: Left;  . CATARACT EXTRACTION     left eye  . CHOLECYSTECTOMY    .  JOINT REPLACEMENT    . NASAL SEPTUM SURGERY    . PARTIAL KNEE ARTHROPLASTY     right  . TOTAL VAGINAL HYSTERECTOMY      Family History  Problem Relation Age of Onset  . Heart disease Mother   . Heart attack Paternal Uncle   . Breast cancer Daughter 107       genetic negative  . Cancer Paternal Grandmother     Social History:  reports that she has never smoked. She has never used smokeless tobacco. She reports that she does not drink alcohol or use drugs.  The patient's husband died 2 years ago.  She was born outside of [place].  She previously worked as an Web designer.  She lives at the [place] at [place].  She is independent except for the use of her walker. Patient has a daughter, Kelli Williams.  The patient is alone today.  Allergies:  Allergies  Allergen Reactions  . Amitriptyline Hives  . Amoxicillin     Diarrhea    . Ivp Dye [Iodinated Diagnostic Agents]   . Tape Other (See Comments)    [place] - please use paper tape.  . Sulfa Antibiotics Rash  . Sulfa Antibiotics Rash    Current Medications: Current Outpatient Medications  Medication Sig  Dispense Refill  . acetaminophen (TYLENOL) 325 MG tablet Take 650 mg by mouth every 6 (six) hours as needed.    Marland Kitchen aspirin 81 MG tablet Take 1 tablet by mouth daily.    . Calcium-Magnesium 500-250 MG TABS Take 2 tablets by mouth daily.     . Coenzyme Q10 (CO Q-10) 100 MG CAPS Take by mouth daily.    . diphenoxylate-atropine (LOMOTIL) 2.5-0.025 MG tablet Take 1 tablet by mouth 4 (four) times daily as needed for diarrhea or loose stools. 30 tablet 0  . ezetimibe (ZETIA) 10 MG tablet Take 1 tablet (10 mg total) by mouth daily. 90 tablet 3  . gabapentin (NEURONTIN) 300 MG capsule Take 1 capsule (300 mg total) by mouth 2 (two) times daily. 540 capsule 1  . lisinopril-hydrochlorothiazide (PRINZIDE,ZESTORETIC) 20-25 MG tablet Take 1.5 tablets by mouth daily. 135 tablet 3  . meloxicam (MOBIC) 15 MG tablet Take 1 tablet (15 mg  total) by mouth daily. 90 tablet 1  . Multiple Vitamins-Minerals (MULTIVITAMIN WITH MINERALS) tablet Take 1 tablet by mouth daily.    Marland Kitchen omeprazole (PRILOSEC) 20 MG capsule TAKE 1 CAPSULE BY MOUTH  DAILY 90 capsule 1  . propranolol (INDERAL) 10 MG tablet Take 1 tablet (10 mg total) by mouth 3 (three) times daily as needed. 90 tablet 3  . traMADol (ULTRAM) 50 MG tablet One half to one tablet every 8 hours as needed pain 90 tablet 0  . nystatin (MYCOSTATIN/NYSTOP) powder Apply topically 4 (four) times daily. (Patient not taking: Reported on 08/06/2018) 15 g 3   No current facility-administered medications for this visit.     Review of Systems  Constitutional: Negative for chills, diaphoresis, malaise/fatigue and weight loss (1 pound).       Feels "lousy" secondary to back pain.  HENT: Negative.  Negative for congestion, ear discharge, ear pain, nosebleeds, sinus pain and sore throat.        Dental issues  Eyes: Negative.  Negative for blurred vision, double vision, photophobia, pain and discharge.  Respiratory: Negative.  Negative for cough, hemoptysis, sputum production, shortness of breath and wheezing.   Cardiovascular: Positive for leg swelling (chronic). Negative for chest pain, palpitations and orthopnea.  Gastrointestinal: Positive for diarrhea. Negative for abdominal pain, blood in stool, constipation, melena, nausea and vomiting.       Diarrhea once in awhile.  Genitourinary: Negative.  Negative for dysuria, frequency and hematuria.  Musculoskeletal: Positive for back pain (low) and joint pain (Pain from back into hip.  BILATERAL knee pain). Negative for falls, myalgias and neck pain.       Lower back s/p prior radiation, increased.  Skin: Negative.  Negative for itching and rash.  Neurological: Negative for tingling, tremors, sensory change, speech change, focal weakness, weakness and headaches.  Endo/Heme/Allergies: Negative.  Does not bruise/bleed easily.  Psychiatric/Behavioral:  Negative for depression and memory loss. The patient is not nervous/anxious and does not have insomnia.   All other systems reviewed and are negative.  Performance status (ECOG): 2  Physical Exam: Blood pressure 132/86, pulse (!) 55, temperature 97.6 F (36.4 C), temperature source Tympanic, resp. rate 18, weight 200 lb 6 oz (90.9 kg), SpO2 95 %. GENERAL:  Elderly woman sitting comfortably in the exam room in no acute distress.  She has a rolling walker at her side. MENTAL STATUS:  Alert and oriented to person, place and time. HEAD:  Short styled gray hair.  Normocephalic, atraumatic, face symmetric, no Cushingoid features. EYES:  Hazel eyes.  Pupils equal round and reactive to light and accomodation.  No conjunctivitis or scleral icterus. ENT:  Oropharynx clear without lesion.  Tongue normal. Mucous membranes moist.  RESPIRATORY:  Clear to auscultation without rales, wheezes or rhonchi. CARDIOVASCULAR:  Regular rate and rhythm without murmur, rub or gallop. BACK:  Pain on palpation of sacrum and SI joint. ABDOMEN:  Soft, non-tender, with active bowel sounds, and no hepatosplenomegaly.  No masses. SKIN:  No rashes, ulcers or lesions. EXTREMITIES: Chronic lower extremity edema.  No skin discoloration or tenderness.  No palpable cords. LYMPH NODES: No palpable cervical, supraclavicular, axillary or inguinal adenopathy  NEUROLOGICAL: Unremarkable. PSYCH:  Appropriate.    No visits with results within 3 Day(s) from this visit.  Latest known visit with results is:  Hospital Outpatient Visit on 07/10/2017  Component Date Value Ref Range Status  . SURGICAL PATHOLOGY 07/10/2017    Final-Edited                   Value:Surgical Pathology THIS IS AN ADDENDUM REPORT CASE: [id number] PATIENT: [facility] Surgical Pathology Report Addendum   Reason for Addendum #1:  Additional clinical/test information  SPECIMEN SUBMITTED: A. Axilla, left  CLINICAL HISTORY: Left axillary mass in a  patient with history of prior ipsilateral breast cancer  PRE-OPERATIVE DIAGNOSIS: Metastatic disease vs second primary cancer  POST-OPERATIVE DIAGNOSIS: None provided.     DIAGNOSIS: A.  LEFT AXILLA; ULTRASOUND-GUIDED NEEDLE CORE BIOPSY: - INVASIVE MAMMARY CARCINOMA WITH FOCAL LOBULAR FEATURES.  Size of invasive carcinoma:  1.1 cm in this sample Histologic grade of invasive carcinoma: Grade 2      Glandular/tubular differentiation score: 3      Nuclear pleomorphism score: 2      Mitotic rate score: 1  ER/PR/HER2: Immunohistochemistry will be performed, with reflex to [place] for HER2 2+ (equivocal) results. The results will be reported in an addendum.  The                          diagnosis was called to Eino Farber of [facility] on 07/11/17 at 227 PM.   GROSS DESCRIPTION:  A. The specimen is received in a formalin-filled container labeled with the patient's name and left axillary mass.  Core pieces: multiple, 2.0 x 0.9 x 0.1 cm Comments: yellow to red lobulated fibrofatty, marked green  Entirely submitted in cassette(s): 1  Time/Date in fixative: placed in formalin at 1:50 PM on 07/10/17 cold ischemic time of less than 5 minutes Total fixation time: 6.5 hours    Final Diagnosis performed by Delorse Lek, MD.  Electronically signed 07/11/2017 2:29:14PM   The electronic signature indicates that the named Attending Pathologist has evaluated the specimen  Technical component performed at [facility], [address] [place], [place] [address] Lab: [phone] Dir: Darrick Penna. Evette Doffing, MD  Professional component performed at [facility], [facility], [place], [place], [place] [address] Lab: [phone]                          Dir: Dellia Nims. Rubinas, MD   Breast Biomarker Reporting Template  BREAST BIOMARKER TESTS Estrogen Receptor (ER) Status: POSITIVE, >90% nuclear staining      Average intensity of staining:  Strong Progesterone Receptor (PgR) Status: POSITIVE, >90% nuclear staining      Average intensity of staining: Strong HER2 (by immunohistochemistry): EQUIVOCAL, 2+ Percentage of cells with uniform intense complete membrane staining: 0%  METHODS  Cold Ischemia and Fixation Times: Meet requirements specified in latest version of the ASCO/CAP guidelines Testing Performed on Block Number(s): A1 Fixative: Formalin Estrogen Receptor:  FDA cleared ([place])                    Primary Antibody:  SP1 Progesterone Receptor: FDA cleared ([place])                   Primary Antibody: 1E2 HER2 (by immunohistochemistry): FDA approved (DAKO)                             Primary Antibody: HercepTest  Immunohistochemistry controls worked appropriately. Slides were prepared by [facility] for [facility] Pathology, [place], [place], and interpreted by Dr. Luana Shu.      Addendum #1 performed by Delorse Lek, MD.  Electronically signed 07/15/2017 1:36:03PM    Technical component performed at [facility], [address] [place], [place] [address] Lab: [phone] Dir: Darrick Penna. Evette Doffing, MD  Professional component performed at [facility], [facility], [place], [place], [place] [address] Lab: [phone] Dir: Dellia Nims. Rubinas, MD      Assessment:  QUINLAN MCFALL is a 84 y.o. female with metastatic breast cancer s/p left axillary biopsy on 07/10/2017.  Pathology revealed a 1.1 cm grade II invasive mammary carcinoma with focal lobular features.   Wide excision on 08/20/2017 revealed metastatic mammary carcinoma involving 1 of 4 axillary lymph nodes with extracapsular extension.  The superior medial margin was involved.  Tumor was ER + (> 90%), PR + (> 90%), and Her2/neu 2+ (FISH -).   [facility] genetic testing on 09/26/2017 revealed no pathogenic sequence variants or deletions/duplications.  BRCA1/2 testing on 09/26/2017 was negative.  She has a history  of left breast cancer s/p wide excision and sentinel lymph node biopsy on 07/18/1999 at [facility].  Pathology revealed a 0.8 x 0.6 x 0.4 cm grade II invasive ductal adenocarcinoma.  There was no lymphovascular invasion.  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  Margins were negative.  Tumor was ER + (50%) and PR + (90%).  Pathologic stage was T1bN0.  She received 6200 cGy from 08/21/1999 - 10/03/1999.  She completed 5 years of tamoxifen in 06/2004.    PET scan on 09/16/2017 revealed widespread scattered hypermetabolic lytic osseous metastases throughout the axial and proximal appendicular skeleton.  Bilateral proximal femoral skeletal metastases placed the patient at risk for pathologic hip fractures.  There was mild hypermetabolic right hilar lymphadenopathy.  Plain films of the bilateral femurs on 09/23/2017 revealed no pathologic fracture. The LEFT view showed lytic subtrochanteric metastasis that was previously seen on PET scan.   PET scan on 06/01/2018 that demonstrated marked improvement in the osseous metastatic disease.  Previously demonstrated areas of abnormally high metabolic activity have resolved, with the exception of a very faint area of residual activity (SUV 2.6; previously 11.5) in the LEFT subtrochanteric region. Previous LEFT axillary fluid collection now shows a 1.8 x 1.2 cm scarlike density with low-grade activity (SUV 2.7). Incidental findings include a small hiatal hernia and diverticulosis of the sigmoid colon.   Lumbar spine MRI on 02/11/2018 revealed multifocal lumbosacral vertebral body signal abnormalities consistent with osseous metastatic disease. The largest lesion was at L1.  There was no pathologic compression fracture. There  was multi-level moderate to severe neural foraminal stenosis, which had progressed since the prior study.  CA27.29 has been followed:  14.7 on 02/05/2017, 19.1 on 02/06/2012, 172.2 on 09/04/2017, 144.9 on 11/06/2017, 73.4 on 02/05/2018, 63.3 on  03/06/2018, 47.2 on 04/06/2018, 42.4 on 05/07/2018, 36.6 on 06/08/2018, 34.6 on 07/06/2018, 27.1 on 08/06/2018, and 28.2 on 09/10/2018.  She received a palliative course of radiation (3000 cGy in 10 fractions) to the left hip from 10/14/2017 - 10/27/2017.  She received a course of palliative radiation the the lumbar spine and SI joints from 03/04/2018 - 03/27/2018.  She began Faslodex on 09/26/2017 (last 08/06/2018).  She began monthly Xgeva on 09/26/2017 (last 05/07/2018).    Bone density on 09/01/2017 revealed osteopenia with a T-score of -1.9 in the right femoral neck and left forearm. She is on calcium and vitamin D.  Symptomatically, she has worsening low back pain.  She has not seen the dentist.  Exam reveals tenderness in the sacrum and SI joint.  CA27.29 is normal.  Plan: 1.   Labs today:  CBC with diff, CMP, CA27.29. 2.   Metastatic breast cancer:  Faslodex today.  Tumor marker normal.  Discuss plan for PET scan on 12/01/2018. 3.  Bone metastasis:  Ongoing dental issues have not been addressed by dentistry.  Assist patient with obtaining dental follow-up.  Hold Xgeva.  Continue calcium and vitamin D. 4.  Back pain:  Etiology unclear.  Patient s/p radiation with normalization of CA27.29.  Pain localizes to the sacrum.  Sacrum MRI. 5.  RTC in 4 weeks for MD assessment, labs (CBC with diff, CMP, CA27.29), Faslodex, and +/- Xgeva.   Nolon Stalls, MD 09/10/2018, 3:52 PM

## 2018-09-11 LAB — CANCER ANTIGEN 27.29: CA 27.29: 28.2 U/mL (ref 0.0–38.6)

## 2018-09-24 ENCOUNTER — Telehealth: Payer: Self-pay | Admitting: *Deleted

## 2018-09-24 NOTE — Telephone Encounter (Signed)
She needs to call the dentist of her choice. Her schedule is too limited for me to try to do. She is limited when she can come due to transportation. Advise her to call and speak with her dentist.

## 2018-09-24 NOTE — Telephone Encounter (Signed)
Discussed with Dr Mike Gip and she suggests we get her in to see Dr Eugenie Birks and he can refer her to endodontist if needed

## 2018-09-24 NOTE — Telephone Encounter (Addendum)
She called and asked that we refer her and the office note states we will help her get one

## 2018-09-24 NOTE — Telephone Encounter (Signed)
Patient does not want to see [place] either as he is the first one who missed the cavity. Other suggestion mentioned she said would not take her insurance.  And so we discussed that she will call the endodontist to see if they will allow her to make an appointment and if not, she will call me back

## 2018-09-24 NOTE — Telephone Encounter (Signed)
Patient states that she was to hear from [place] regarding endodontist appointment. She states she has heard nothing from [place] or anyone about it. Please advise.  3.  Bone metastasis:             Ongoing dental issues have not been addressed by dentistry.             Assist patient with obtaining dental follow-up.             Hold Xgeva.             Continue calcium and vitamin D.

## 2018-09-24 NOTE — Telephone Encounter (Signed)
Again, she needs to contact her dentist.

## 2018-09-25 NOTE — Telephone Encounter (Signed)
  I sent dental referral to Dr Aleda Grana on 09-11-18.

## 2018-09-26 ENCOUNTER — Ambulatory Visit
Admission: RE | Admit: 2018-09-26 | Discharge: 2018-09-26 | Disposition: A | Discharge status: Home / Self Care | Payer: Medicare Other | Source: Ambulatory Visit | Attending: Hematology and Oncology | Admitting: Hematology and Oncology

## 2018-09-26 DIAGNOSIS — Z170 Estrogen receptor positive status [ER+]: Secondary | ICD-10-CM | POA: Diagnosis not present

## 2018-09-26 DIAGNOSIS — K5790 Diverticulosis of intestine, part unspecified, without perforation or abscess without bleeding: Secondary | ICD-10-CM | POA: Insufficient documentation

## 2018-09-26 DIAGNOSIS — M47816 Spondylosis without myelopathy or radiculopathy, lumbar region: Secondary | ICD-10-CM | POA: Insufficient documentation

## 2018-09-26 DIAGNOSIS — C7951 Secondary malignant neoplasm of bone: Secondary | ICD-10-CM | POA: Insufficient documentation

## 2018-09-26 DIAGNOSIS — M533 Sacrococcygeal disorders, not elsewhere classified: Secondary | ICD-10-CM | POA: Insufficient documentation

## 2018-09-26 DIAGNOSIS — C50412 Malignant neoplasm of upper-outer quadrant of left female breast: Secondary | ICD-10-CM | POA: Insufficient documentation

## 2018-09-26 DIAGNOSIS — K5730 Diverticulosis of large intestine without perforation or abscess without bleeding: Secondary | ICD-10-CM | POA: Diagnosis not present

## 2018-09-29 ENCOUNTER — Telehealth: Payer: Self-pay | Admitting: *Deleted

## 2018-09-29 NOTE — Telephone Encounter (Signed)
Kelli Williams...  Please see what they need. I am unsure how she got to [place] [place].   BG

## 2018-09-29 NOTE — Telephone Encounter (Signed)
Dr Thereasa Parkin, dentist asks for a return call from Dr Mike Gip, Gaspar Bidding can you please call him?  [phone]

## 2018-09-30 ENCOUNTER — Other Ambulatory Visit: Payer: Self-pay | Admitting: Urgent Care

## 2018-09-30 NOTE — Telephone Encounter (Signed)
She has ended up with Dr. Curlene Dolphin, DDS

## 2018-09-30 NOTE — Telephone Encounter (Signed)
  I sent Gaspar Bidding the name of our new dental clinic with [facility].  M

## 2018-09-30 NOTE — Telephone Encounter (Signed)
Call received from Romona Curls at [facility] asking for medical clearance for cleaning and  ANY dental treatments deemed necessary She requests that this be emailed to [email or web address], or fax [phone]

## 2018-09-30 NOTE — Telephone Encounter (Signed)
Fax sent to [facility] 09-30-18.

## 2018-09-30 NOTE — Telephone Encounter (Signed)
Call returned this morning and the person needing to speak with me was with a patient and I was told she will call me back. I have not heard from her at this time

## 2018-10-09 ENCOUNTER — Encounter: Payer: Self-pay | Admitting: Hematology and Oncology

## 2018-10-09 ENCOUNTER — Other Ambulatory Visit: Payer: Self-pay

## 2018-10-09 ENCOUNTER — Inpatient Hospital Stay: Payer: Medicare Other

## 2018-10-09 ENCOUNTER — Inpatient Hospital Stay (HOSPITAL_BASED_OUTPATIENT_CLINIC_OR_DEPARTMENT_OTHER): Payer: Medicare Other | Admitting: Hematology and Oncology

## 2018-10-09 ENCOUNTER — Inpatient Hospital Stay: Payer: Medicare Other | Attending: Hematology and Oncology

## 2018-10-09 VITALS — BP 162/80 | HR 60 | Temp 97.6°F | Resp 18 | Wt 202.4 lb

## 2018-10-09 DIAGNOSIS — C7951 Secondary malignant neoplasm of bone: Secondary | ICD-10-CM

## 2018-10-09 DIAGNOSIS — Z79899 Other long term (current) drug therapy: Secondary | ICD-10-CM

## 2018-10-09 DIAGNOSIS — G893 Neoplasm related pain (acute) (chronic): Secondary | ICD-10-CM | POA: Diagnosis not present

## 2018-10-09 DIAGNOSIS — M85851 Other specified disorders of bone density and structure, right thigh: Secondary | ICD-10-CM

## 2018-10-09 DIAGNOSIS — Z5111 Encounter for antineoplastic chemotherapy: Secondary | ICD-10-CM | POA: Insufficient documentation

## 2018-10-09 DIAGNOSIS — C50412 Malignant neoplasm of upper-outer quadrant of left female breast: Secondary | ICD-10-CM

## 2018-10-09 DIAGNOSIS — Z170 Estrogen receptor positive status [ER+]: Secondary | ICD-10-CM | POA: Diagnosis not present

## 2018-10-09 DIAGNOSIS — R635 Abnormal weight gain: Secondary | ICD-10-CM

## 2018-10-09 DIAGNOSIS — Z7189 Other specified counseling: Secondary | ICD-10-CM

## 2018-10-09 LAB — TSH: TSH: 2.045 u[IU]/mL (ref 0.350–4.500)

## 2018-10-09 LAB — COMPREHENSIVE METABOLIC PANEL
ALT: 14 U/L (ref 0–44)
AST: 19 U/L (ref 15–41)
Albumin: 4.1 g/dL (ref 3.5–5.0)
Alkaline Phosphatase: 60 U/L (ref 38–126)
Anion gap: 7 (ref 5–15)
BUN: 23 mg/dL (ref 8–23)
CO2: 29 mmol/L (ref 22–32)
Calcium: 9.3 mg/dL (ref 8.9–10.3)
Chloride: 99 mmol/L (ref 98–111)
Creatinine, Ser: 0.77 mg/dL (ref 0.44–1.00)
GFR calc Af Amer: >60 mL/min (ref >60)
GFR calc non Af Amer: >60 mL/min (ref >60)
Glucose, Bld: 95 mg/dL (ref 70–99)
Potassium: 4 mmol/L (ref 3.5–5.1)
Sodium: 135 mmol/L (ref 135–145)
Total Bilirubin: 0.4 mg/dL (ref 0.3–1.2)
Total Protein: 7.6 g/dL (ref 6.5–8.1)

## 2018-10-09 LAB — CBC WITH DIFFERENTIAL/PLATELET
Abs Immature Granulocytes: 0.02 10*3/uL (ref 0.00–0.07)
Basophils Absolute: 0 10*3/uL (ref 0.0–0.1)
Basophils Relative: 1 %
Eosinophils Absolute: 0.3 10*3/uL (ref 0.0–0.5)
Eosinophils Relative: 5 %
HCT: 37 % (ref 36.0–46.0)
Hemoglobin: 12.2 g/dL (ref 12.0–15.0)
Immature Granulocytes: 0 %
Lymphocytes Relative: 18 %
Lymphs Abs: 1 10*3/uL (ref 0.7–4.0)
MCH: 29 pg (ref 26.0–34.0)
MCHC: 33 g/dL (ref 30.0–36.0)
MCV: 88.1 fL (ref 80.0–100.0)
Monocytes Absolute: 0.4 10*3/uL (ref 0.1–1.0)
Monocytes Relative: 8 %
Neutro Abs: 3.7 10*3/uL (ref 1.7–7.7)
Neutrophils Relative %: 68 %
Platelets: 218 10*3/uL (ref 150–400)
RBC: 4.2 MIL/uL (ref 3.87–5.11)
RDW: 14 % (ref 11.5–15.5)
WBC: 5.5 10*3/uL (ref 4.0–10.5)
nRBC: 0 % (ref 0.0–0.2)

## 2018-10-09 LAB — T4, FREE: Free T4: 3.06 ng/dL — ABNORMAL HIGH (ref 0.82–1.77)

## 2018-10-09 MED ORDER — FULVESTRANT 250 MG/5ML IM SOLN
500.0000 mg | Freq: Once | INTRAMUSCULAR | Status: AC
  Administered 2018-10-09: 500 mg via INTRAMUSCULAR
  Filled 2018-10-09: qty 10

## 2018-10-09 NOTE — Progress Notes (Signed)
Patient here for one month follow up. Patient is concerned about her weight gain.

## 2018-10-09 NOTE — Progress Notes (Signed)
[facility] day:  10/09/2018   Chief Complaint: Kelli Williams is a 84 y.o. female with metastatic breast cancer who is seen for a review of interval imaging studies and 1 month assessment on Faslodex and Xgeva.  HPI:  The patient was last seen in the medical oncology clinic on 09/10/2018.  At that time, she had worsening low back pain.  She had not seen the dentist.  Exam revealed tenderness in the sacrum and SI joint.  CA27.29 was normal.  She received Faslodex.  Kelli Williams was held secondary to dental issues.  Because of sacral pain, the decision was made to pursue an MRI.  Sacral MRI on 09/26/2018 revealed scattered small marrow lesions c/w metastatic disease. The 0.6 cm lesion in the S1 vertebral body was new since the comparison lumbar spine MRI. Small lesion in the left ilium adjacent to the SI joint was present on the prior MRI. The remaining lesions were not included on the prior MRI.  There was no fracture or other acute abnormality.  There was lower lumbar spondylosis and diverticulosis.  During the interim, patient notes that she is not doing well. She states, "I just don't feel good. I cant do anything anymore because my balance". Patient notes that her LEFT lower leg is "numb" that she attributes to her known hip problems. Patient "catches" herself multiple times a day to keep from falling. She has neuropathy for which she uses 900 mg of gabapentin BID. She has known issues with her LEFT knee and needs surgery. She has persistent pain in her RIGHT lower back. Patient has chronic lower extremity edema.   Patient frustrated with her weight gain since moving to this state. She notes that when she moved here, her weight was 148 pounds. Weight today is 202 lb 7 oz (91.8 kg), which compared to her last visit to the clinic, represents a 2 pound increase. Patient advises that she maintains an adequate appetite. She is eating well. Patient continues to have  dental issues. She states, "I don't trust Kelli Williams or Kelli Williams. I saw Kelli Williams and his daughter, Kelli Williams. They referred me to Kelli Williams".   Patient denies that she has experienced any B symptoms. She denies any interval infections.  Patient does not verbalize any concerns with regards to her breasts today. Patient performs monthly self breast examinations as recommended.   Patient denies pain in the clinic today.   Past Medical History:  Diagnosis Date  . Anxiety   . Arthritis    BOTH FEET AND KNEES  . Breast cancer ([place]) 2000   left breast ca with lumpectomy and rad tx 5 yr tamoxifen/Dr Jeannine Kitten at [place]  . Complication of anesthesia    WOKE UP DURING SURGERY FOR DEVIATED SEPTUM, KNEE REPLACEMENT AND BREAST LUMPECTOMY  . Dysrhythmia   . GERD (gastroesophageal reflux disease)   . Hypertension   . Neuropathic pain of both legs   . Neuropathy   . Pneumonia 2016  . Skin cancer of forehead   . Skin cancer of nose     Past Surgical History:  Procedure Laterality Date  . ABDOMINAL HYSTERECTOMY    . AXILLARY LYMPH NODE BIOPSY Left 07/10/2017   INVASIVE MAMMARY CARCINOMA WITH FOCAL LOBULAR FEATURES.   Marland Kitchen BREAST BIOPSY Bilateral 1960   benign  . BREAST LUMPECTOMY Left 2000   breast ca with rad tx  . BREAST LUMPECTOMY Left 08/20/2017  . BREAST LUMPECTOMY Left 08/20/2017  Procedure: left breast wide excision;  Surgeon: Robert Bellow, MD;  Location: [facility] ORS;  Service: General;  Laterality: Left;  . CATARACT EXTRACTION     left eye  . CHOLECYSTECTOMY    . JOINT REPLACEMENT    . NASAL SEPTUM SURGERY    . PARTIAL KNEE ARTHROPLASTY     right  . TOTAL VAGINAL HYSTERECTOMY      Family History  Problem Relation Age of Onset  . Heart disease Mother   . Heart attack Paternal Uncle   . Breast cancer Daughter 16       genetic negative  . Cancer Paternal Grandmother     Social History:  reports that she has never smoked. She has never used smokeless tobacco. She reports  that she does not drink alcohol or use drugs.  The patient's husband died 2 years ago.  She was born outside of [place].  She previously worked as an Web designer.  She lives at the [facility] [place].  She is independent except for the use of her walker. Patient has a daughter, Kelli Williams.  The patient is alone today.  Allergies:  Allergies  Allergen Reactions  . Amitriptyline Hives  . Amoxicillin     Diarrhea    . Ivp Dye [Iodinated Diagnostic Agents]   . Tape Other (Williams Comments)    [place] - please use paper tape.  . Sulfa Antibiotics Rash  . Sulfa Antibiotics Rash    Current Medications: Current Outpatient Medications  Medication Sig Dispense Refill  . acetaminophen (TYLENOL) 325 MG tablet Take 650 mg by mouth every 6 (six) hours as needed.    Marland Kitchen aspirin 81 MG tablet Take 1 tablet by mouth daily.    . Calcium-Magnesium 500-250 MG TABS Take 2 tablets by mouth daily.     . Coenzyme Q10 (CO Q-10) 100 MG CAPS Take by mouth daily.    . diphenoxylate-atropine (LOMOTIL) 2.5-0.025 MG tablet Take 1 tablet by mouth 4 (four) times daily as needed for diarrhea or loose stools. 30 tablet 0  . ezetimibe (ZETIA) 10 MG tablet Take 1 tablet (10 mg total) by mouth daily. 90 tablet 3  . gabapentin (NEURONTIN) 300 MG capsule Take 1 capsule (300 mg total) by mouth 2 (two) times daily. 540 capsule 1  . lisinopril-hydrochlorothiazide (PRINZIDE,ZESTORETIC) 20-25 MG tablet Take 1.5 tablets by mouth daily. 135 tablet 3  . meloxicam (MOBIC) 15 MG tablet Take 1 tablet (15 mg total) by mouth daily. 90 tablet 1  . Multiple Vitamins-Minerals (MULTIVITAMIN WITH MINERALS) tablet Take 1 tablet by mouth daily.    Marland Kitchen nystatin (MYCOSTATIN/NYSTOP) powder Apply topically 4 (four) times daily. 15 g 3  . omeprazole (PRILOSEC) 20 MG capsule TAKE 1 CAPSULE BY MOUTH  DAILY 90 capsule 1  . propranolol (INDERAL) 10 MG tablet Take 1 tablet (10 mg total) by mouth 3 (three) times daily as needed. 90 tablet 3  .  traMADol (ULTRAM) 50 MG tablet One half to one tablet every 8 hours as needed pain 90 tablet 0  . clindamycin (CLEOCIN) 300 MG capsule Take 300 mg by mouth once. Prior to dental appointment     No current facility-administered medications for this visit.     Review of Systems  Constitutional: Negative for diaphoresis, fever, malaise/fatigue and weight loss (up 2 pounds).       "I just don't feel good".   HENT: Negative for sinus pain and sore throat.        Dental issues; has been  seen by multiple providers.  Eyes: Negative.   Respiratory: Negative for cough, hemoptysis, sputum production and shortness of breath.   Cardiovascular: Positive for leg swelling. Negative for chest pain, palpitations, orthopnea and PND.  Gastrointestinal: Negative for abdominal pain, blood in stool, constipation, diarrhea, melena, nausea and vomiting.  Genitourinary: Negative for dysuria, frequency, hematuria and urgency.  Musculoskeletal: Positive for back pain and joint pain (LEFT knee and hip pain; LLE numbness). Negative for falls and myalgias.  Skin: Negative for itching and rash.  Neurological: Positive for sensory change (neuropathy on gabapentin). Negative for dizziness, tremors, weakness and headaches.       Balance issues  Endo/Heme/Allergies: Does not bruise/bleed easily.  Psychiatric/Behavioral: Negative for depression, memory loss and suicidal ideas. The patient is not nervous/anxious and does not have insomnia.   All other systems reviewed and are negative.  Performance status (ECOG): 2 - Symptomatic, <50% confined to bed  Vital Signs BP (!) 162/80 (BP Location: Right Arm, Patient Position: Sitting)   Pulse 60   Temp 97.6 F (36.4 C) (Tympanic)   Resp 18   Wt 202 lb 7 oz (91.8 kg)   BMI 34.75 kg/m   Physical Exam  Constitutional: She is oriented to person, place, and time and well-developed, well-nourished, and in no distress. No distress.  HENT:  Head: Normocephalic and atraumatic.   Mouth/Throat: Oropharynx is clear and moist and mucous membranes are normal. No oropharyngeal exudate.  Eyes: Pupils are equal, round, and reactive to light. Conjunctivae and EOM are normal. No scleral icterus.  Neck: Normal range of motion. Neck supple. No JVD present.  Cardiovascular: Normal rate, regular rhythm, normal heart sounds and intact distal pulses. Exam reveals no gallop and no friction rub.  No murmur heard. Pulmonary/Chest: Effort normal and breath sounds normal. No respiratory distress. She has no wheezes. She has no rales.  Abdominal: Soft. Bowel sounds are normal. She exhibits no distension and no mass. There is no abdominal tenderness. There is no guarding.  Musculoskeletal: Normal range of motion. She exhibits edema (BLE).       Left hip: She exhibits bony tenderness.       Right knee: Tenderness found.       Left knee: Tenderness found.  Lumbosacral tenderness. (+) pain over SI joint.  Lymphadenopathy:    She has no cervical adenopathy.    She has no axillary adenopathy.       Right: No inguinal and no supraclavicular adenopathy present.       Left: No inguinal and no supraclavicular adenopathy present.  Neurological: She is alert and oriented to person, place, and time.  Skin: Skin is warm and dry. No rash noted. She is not diaphoretic. No erythema.  Psychiatric: Mood, affect and judgment normal.  Nursing note and vitals reviewed.   Results for orders placed or performed during the hospital encounter of 10/15/18 (from the past 336 hour(s))  Glucose, capillary   Collection Time: 10/15/18 11:09 AM  Result Value Ref Range   Glucose-Capillary 91 70 - 99 mg/dL  Results for orders placed or performed in visit on 10/09/18 (from the past 336 hour(s))  T4, free   Collection Time: 10/09/18 12:57 PM  Result Value Ref Range   Free T4 3.06 (H) 0.82 - 1.77 ng/dL  TSH   Collection Time: 10/09/18 12:57 PM  Result Value Ref Range   TSH 2.045 0.350 - 4.500 uIU/mL  Results  for orders placed or performed in visit on 10/09/18 (from the past 336 hour(s))  Cancer antigen 27.29   Collection Time: 10/09/18 12:57 PM  Result Value Ref Range   CA 27.29 26.0 0.0 - 38.6 U/mL  Comprehensive metabolic panel   Collection Time: 10/09/18 12:57 PM  Result Value Ref Range   Sodium 135 135 - 145 mmol/L   Potassium 4.0 3.5 - 5.1 mmol/L   Chloride 99 98 - 111 mmol/L   CO2 29 22 - 32 mmol/L   Glucose, Bld 95 70 - 99 mg/dL   BUN 23 8 - 23 mg/dL   Creatinine, Ser 0.77 0.44 - 1.00 mg/dL   Calcium 9.3 8.9 - 10.3 mg/dL   Total Protein 7.6 6.5 - 8.1 g/dL   Albumin 4.1 3.5 - 5.0 g/dL   AST 19 15 - 41 U/L   ALT 14 0 - 44 U/L   Alkaline Phosphatase 60 38 - 126 U/L   Total Bilirubin 0.4 0.3 - 1.2 mg/dL   GFR calc non Af Amer >60 >60 mL/min   GFR calc Af Amer >60 >60 mL/min   Anion gap 7 5 - 15  CBC with Differential   Collection Time: 10/09/18 12:57 PM  Result Value Ref Range   WBC 5.5 4.0 - 10.5 K/uL   RBC 4.20 3.87 - 5.11 MIL/uL   Hemoglobin 12.2 12.0 - 15.0 g/dL   HCT 37.0 36.0 - 46.0 %   MCV 88.1 80.0 - 100.0 fL   MCH 29.0 26.0 - 34.0 pg   MCHC 33.0 30.0 - 36.0 g/dL   RDW 14.0 11.5 - 15.5 %   Platelets 218 150 - 400 K/uL   nRBC 0.0 0.0 - 0.2 %   Neutrophils Relative % 68 %   Neutro Abs 3.7 1.7 - 7.7 K/uL   Lymphocytes Relative 18 %   Lymphs Abs 1.0 0.7 - 4.0 K/uL   Monocytes Relative 8 %   Monocytes Absolute 0.4 0.1 - 1.0 K/uL   Eosinophils Relative 5 %   Eosinophils Absolute 0.3 0.0 - 0.5 K/uL   Basophils Relative 1 %   Basophils Absolute 0.0 0.0 - 0.1 K/uL   Immature Granulocytes 0 %   Abs Immature Granulocytes 0.02 0.00 - 0.07 K/uL     Assessment:  LATEISHA THURLOW is a 84 y.o. female with metastatic breast cancer s/p left axillary biopsy on 07/10/2017.  Pathology revealed a 1.1 cm grade II invasive mammary carcinoma with focal lobular features.   Wide excision on 08/20/2017 revealed metastatic mammary carcinoma involving 1 of 4 axillary lymph nodes with  extracapsular extension.  The superior medial margin was involved.  Tumor was ER + (> 90%), PR + (> 90%), and Her2/neu 2+ (FISH -).   [facility] genetic testing on 09/26/2017 revealed no pathogenic sequence variants or deletions/duplications.  BRCA1/2 testing on 09/26/2017 was negative.  She has a history of left breast cancer s/p wide excision and sentinel lymph node biopsy on 07/18/1999 at [facility].  Pathology revealed a 0.8 x 0.6 x 0.4 cm grade II invasive ductal adenocarcinoma.  There was no lymphovascular invasion.  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  Margins were negative.  Tumor was ER + (50%) and PR + (90%).  Pathologic stage was T1bN0.  She received 6200 cGy from 08/21/1999 - 10/03/1999.  She completed 5 years of tamoxifen in 06/2004.    PET scan on 09/16/2017 revealed widespread scattered hypermetabolic lytic osseous metastases throughout the axial and proximal appendicular skeleton.  Bilateral proximal femoral skeletal metastases placed the patient at risk for pathologic hip fractures.  There was mild hypermetabolic right hilar lymphadenopathy.  Plain films of the bilateral femurs on 09/23/2017 revealed no pathologic fracture. The LEFT view showed lytic subtrochanteric metastasis that was previously seen on PET scan.   PET scan on 06/01/2018 that demonstrated marked improvement in the osseous metastatic disease.  Previously demonstrated areas of abnormally high metabolic activity have resolved, with the exception of a very faint area of residual activity (SUV 2.6; previously 11.5) in the LEFT subtrochanteric region. Previous LEFT axillary fluid collection now shows a 1.8 x 1.2 cm scarlike density with low-grade activity (SUV 2.7). Incidental findings include a small hiatal hernia and diverticulosis of the sigmoid colon.   Lumbar spine MRI on 02/11/2018 revealed multifocal lumbosacral vertebral body signal abnormalities consistent with osseous metastatic disease. The largest lesion was  at L1.  There was no pathologic compression fracture. There was multi-level moderate to severe neural foraminal stenosis, which had progressed since the prior study.  CA27.29 has been followed:  14.7 on 02/05/2017, 19.1 on 02/06/2012, 172.2 on 09/04/2017, 144.9 on 11/06/2017, 73.4 on 02/05/2018, 63.3 on 03/06/2018, 47.2 on 04/06/2018, 42.4 on 05/07/2018, 36.6 on 06/08/2018, 34.6 on 07/06/2018, 27.1 on 08/06/2018, and 28.2 on 09/10/2018.  She received a palliative course of radiation (3000 cGy in 10 fractions) to the left hip from 10/14/2017 - 10/27/2017.  She received a course of palliative radiation the the lumbar spine and SI joints from 03/04/2018 - 03/27/2018.  She began Faslodex on 09/26/2017 (last 09/10/2018).  She began monthly Xgeva on 09/26/2017 (last 05/07/2018).    Bone density on 09/01/2017 revealed osteopenia with a T-score of -1.9 in the right femoral neck and left forearm. She is on calcium and vitamin D.  Symptomatically, patient is not feeling well. She is upset because of her weight. Patient with chronic pain in her back, hips, and knees. She is having continues dental issues despite being referred to multiple providers. Patient scheduled to Williams Kelli Williams next. Exam reveals lumbosacral tenderness, mainly overly the SI joint.   Plan: 1. Labs today:  CBC with diff, CMP, CA27.29, TSH, free T4 2. Metastatic breast cancer  Doing well overall.  No symptoms.  No breast concerns.  CA27.29 stable at 26.0 U/mL.  Discuss plan for imaging in 11/2018, however given patient's complaints of increased bone pain, we will schedule PET scan now.  If PET scan shows progression, anticipate discontinuation of Faslodex.  If progressive disease, plans are to pursue further treatment with an aromatase inhibitor or tamoxifen + palbociclib.  Faslodex injection today as scheduled. 3. Bone metastasis  Review sacral MRI. Imaging personally reviewed and felt to be consistent with the dictated  radiology report. Imaging reviewed with patient.   Scattered small marrow lesions consistent with metastatic disease  New 0.6 cm lesion in the S1 vertebral body since the previous lumbar spine MRI back in 02/2018.  Continue to hold Xgeva until dental issues resolved.  Continue supplemental calcium and vitamin D daily. 4. Dental issues  Ongoing dental issues.    Patient has been seen by multiple providers, however she has found reasons to delay care/intervention with each one.   Pending consult with endodontist Aleda Williams, DDS) at this time.  5. Weight gain  Check TSH and free T4 today.   Discuss caloric intake and activity level.  6. RTC in 4 weeks for MD assessment, labs (CBC with diff, CMP, CA27.29), Faslodex, and +/- Xgeva.   Kelli Loh, NP 10/09/18, 2:58 PM    I saw and evaluated the patient, participating in  the key portions of the service and reviewing pertinent diagnostic studies and records.  I reviewed the nurse practitioner's note and agree with the findings and the plan.  The assessment and plan were discussed with the patient.  Multiple questions were asked by the patient and answered.   Kelli Stalls, MD 10/09/2018,2:57 PM

## 2018-10-10 LAB — CANCER ANTIGEN 27.29: CA 27.29: 26 U/mL (ref 0.0–38.6)

## 2018-10-15 ENCOUNTER — Encounter
Admission: RE | Admit: 2018-10-15 | Discharge: 2018-10-15 | Disposition: A | Discharge status: Home / Self Care | Payer: Medicare Other | Source: Ambulatory Visit | Attending: Urgent Care | Admitting: Urgent Care

## 2018-10-15 DIAGNOSIS — C50912 Malignant neoplasm of unspecified site of left female breast: Secondary | ICD-10-CM | POA: Diagnosis not present

## 2018-10-15 DIAGNOSIS — C50412 Malignant neoplasm of upper-outer quadrant of left female breast: Secondary | ICD-10-CM | POA: Insufficient documentation

## 2018-10-15 DIAGNOSIS — C7951 Secondary malignant neoplasm of bone: Secondary | ICD-10-CM | POA: Diagnosis not present

## 2018-10-15 DIAGNOSIS — G893 Neoplasm related pain (acute) (chronic): Secondary | ICD-10-CM | POA: Insufficient documentation

## 2018-10-15 DIAGNOSIS — Z170 Estrogen receptor positive status [ER+]: Secondary | ICD-10-CM | POA: Insufficient documentation

## 2018-10-15 LAB — GLUCOSE, CAPILLARY: Glucose-Capillary: 91 mg/dL (ref 70–99)

## 2018-10-15 MED ORDER — FLUDEOXYGLUCOSE F - 18 (FDG) INJECTION
11.5100 | Freq: Once | INTRAVENOUS | Status: AC | PRN
  Administered 2018-10-15: 11.51 via INTRAVENOUS

## 2018-10-17 ENCOUNTER — Other Ambulatory Visit: Payer: Self-pay | Admitting: Urgent Care

## 2018-11-09 ENCOUNTER — Encounter: Payer: Self-pay | Admitting: Hematology and Oncology

## 2018-11-09 ENCOUNTER — Inpatient Hospital Stay: Payer: Medicare Other

## 2018-11-09 ENCOUNTER — Inpatient Hospital Stay (HOSPITAL_BASED_OUTPATIENT_CLINIC_OR_DEPARTMENT_OTHER): Payer: Medicare Other | Admitting: Hematology and Oncology

## 2018-11-09 ENCOUNTER — Inpatient Hospital Stay: Payer: Medicare Other | Attending: Hematology and Oncology

## 2018-11-09 ENCOUNTER — Other Ambulatory Visit: Payer: Self-pay

## 2018-11-09 VITALS — BP 118/72 | HR 84 | Temp 98.4°F | Resp 18 | Wt 199.0 lb

## 2018-11-09 DIAGNOSIS — I10 Essential (primary) hypertension: Secondary | ICD-10-CM | POA: Diagnosis not present

## 2018-11-09 DIAGNOSIS — C773 Secondary and unspecified malignant neoplasm of axilla and upper limb lymph nodes: Secondary | ICD-10-CM | POA: Diagnosis not present

## 2018-11-09 DIAGNOSIS — C50411 Malignant neoplasm of upper-outer quadrant of right female breast: Secondary | ICD-10-CM | POA: Insufficient documentation

## 2018-11-09 DIAGNOSIS — M8580 Other specified disorders of bone density and structure, unspecified site: Secondary | ICD-10-CM

## 2018-11-09 DIAGNOSIS — Z170 Estrogen receptor positive status [ER+]: Secondary | ICD-10-CM

## 2018-11-09 DIAGNOSIS — C7951 Secondary malignant neoplasm of bone: Secondary | ICD-10-CM | POA: Diagnosis not present

## 2018-11-09 DIAGNOSIS — C50412 Malignant neoplasm of upper-outer quadrant of left female breast: Secondary | ICD-10-CM

## 2018-11-09 DIAGNOSIS — Z5111 Encounter for antineoplastic chemotherapy: Secondary | ICD-10-CM | POA: Insufficient documentation

## 2018-11-09 DIAGNOSIS — M85851 Other specified disorders of bone density and structure, right thigh: Secondary | ICD-10-CM

## 2018-11-09 DIAGNOSIS — K0889 Other specified disorders of teeth and supporting structures: Secondary | ICD-10-CM

## 2018-11-09 LAB — CBC WITH DIFFERENTIAL/PLATELET
Abs Immature Granulocytes: 0.02 10*3/uL (ref 0.00–0.07)
Basophils Absolute: 0 10*3/uL (ref 0.0–0.1)
Basophils Relative: 1 %
Eosinophils Absolute: 0.3 10*3/uL (ref 0.0–0.5)
Eosinophils Relative: 6 %
HCT: 35.2 % — ABNORMAL LOW (ref 36.0–46.0)
Hemoglobin: 11.8 g/dL — ABNORMAL LOW (ref 12.0–15.0)
Immature Granulocytes: 0 %
Lymphocytes Relative: 19 %
Lymphs Abs: 1.1 10*3/uL (ref 0.7–4.0)
MCH: 29.2 pg (ref 26.0–34.0)
MCHC: 33.5 g/dL (ref 30.0–36.0)
MCV: 87.1 fL (ref 80.0–100.0)
Monocytes Absolute: 0.5 10*3/uL (ref 0.1–1.0)
Monocytes Relative: 8 %
Neutro Abs: 3.9 10*3/uL (ref 1.7–7.7)
Neutrophils Relative %: 66 %
Platelets: 199 10*3/uL (ref 150–400)
RBC: 4.04 MIL/uL (ref 3.87–5.11)
RDW: 14.2 % (ref 11.5–15.5)
WBC: 5.9 10*3/uL (ref 4.0–10.5)
nRBC: 0 % (ref 0.0–0.2)

## 2018-11-09 LAB — COMPREHENSIVE METABOLIC PANEL
ALT: 14 U/L (ref 0–44)
AST: 21 U/L (ref 15–41)
Albumin: 3.9 g/dL (ref 3.5–5.0)
Alkaline Phosphatase: 57 U/L (ref 38–126)
Anion gap: 8 (ref 5–15)
BUN: 23 mg/dL (ref 8–23)
CO2: 24 mmol/L (ref 22–32)
Calcium: 9 mg/dL (ref 8.9–10.3)
Chloride: 100 mmol/L (ref 98–111)
Creatinine, Ser: 0.83 mg/dL (ref 0.44–1.00)
GFR calc Af Amer: >60 mL/min (ref >60)
GFR calc non Af Amer: >60 mL/min (ref >60)
Glucose, Bld: 124 mg/dL — ABNORMAL HIGH (ref 70–99)
Potassium: 4 mmol/L (ref 3.5–5.1)
Sodium: 132 mmol/L — ABNORMAL LOW (ref 135–145)
Total Bilirubin: 0.4 mg/dL (ref 0.3–1.2)
Total Protein: 7.3 g/dL (ref 6.5–8.1)

## 2018-11-09 MED ORDER — FULVESTRANT 250 MG/5ML IM SOLN
500.0000 mg | Freq: Once | INTRAMUSCULAR | Status: AC
  Administered 2018-11-09: 500 mg via INTRAMUSCULAR

## 2018-11-09 NOTE — Progress Notes (Signed)
[facility] day:  11/09/2018   Chief Complaint: Kelli Williams is a 84 y.o. female with metastatic breast cancer who is seen for a review of interval PET scan and 1 month assessment on Faslodex.  HPI:  The patient was last seen in the medical oncology clinic on 10/09/2018.  At that time, she was not feeling well. She was upset because of her weight.  She had chronic pain in her back, hips, and knees. She was having ongoing dental issues despite being referred to multiple providers. Patient was scheduled to see Dr. Aleda Grana next. Exam revealed lumbosacral tenderness, mainly overly the SI joint. CA27.29 was normal.  TSH was 2.045 (normal) and free T4 3.06 (0.82 - 1.77).  PET scan on 10/15/2018 revealed no evidence of active breast cancer metastasis on FDG PET scan.  There were stable lytic lesions in the spine without evidence of metabolic activity consistent treated metastasis.  During the interim, she has felt "ok".  She notes some fatigue.  She had multiple relatives (7) in town for Thanksgiving.  She denies any breast concerns.  She denies any bone pain.  She states that she took biotin for her nails and hair.    Past Medical History:  Diagnosis Date  . Anxiety   . Arthritis    BOTH FEET AND KNEES  . Breast cancer ([place]) 2000   left breast ca with lumpectomy and rad tx 5 yr tamoxifen/Dr Jeannine Kitten at [place]  . Complication of anesthesia    WOKE UP DURING SURGERY FOR DEVIATED SEPTUM, KNEE REPLACEMENT AND BREAST LUMPECTOMY  . Dysrhythmia   . GERD (gastroesophageal reflux disease)   . Hypertension   . Neuropathic pain of both legs   . Neuropathy   . Pneumonia 2016  . Skin cancer of forehead   . Skin cancer of nose     Past Surgical History:  Procedure Laterality Date  . ABDOMINAL HYSTERECTOMY    . AXILLARY LYMPH NODE BIOPSY Left 07/10/2017   INVASIVE MAMMARY CARCINOMA WITH FOCAL LOBULAR FEATURES.   Marland Kitchen BREAST BIOPSY Bilateral 1960   benign  .  BREAST LUMPECTOMY Left 2000   breast ca with rad tx  . BREAST LUMPECTOMY Left 08/20/2017  . BREAST LUMPECTOMY Left 08/20/2017   Procedure: left breast wide excision;  Surgeon: Robert Bellow, MD;  Location: [facility] ORS;  Service: General;  Laterality: Left;  . CATARACT EXTRACTION     left eye  . CHOLECYSTECTOMY    . JOINT REPLACEMENT    . NASAL SEPTUM SURGERY    . PARTIAL KNEE ARTHROPLASTY     right  . TOTAL VAGINAL HYSTERECTOMY      Family History  Problem Relation Age of Onset  . Heart disease Mother   . Heart attack Paternal Uncle   . Breast cancer Daughter 57       genetic negative  . Cancer Paternal Grandmother     Social History:  reports that she has never smoked. She has never used smokeless tobacco. She reports that she does not drink alcohol or use drugs.  The patient's husband died 2 years ago.  She was born outside of [place].  She previously worked as an Web designer.  She lives at the [facility] [place].  She is independent except for the use of her walker. Patient has a daughter, Giannah Zavadil.  The patient is alone today.  Allergies:  Allergies  Allergen Reactions  . Amitriptyline Hives  . Amoxicillin  Diarrhea    . Ivp Dye [Iodinated Diagnostic Agents]   . Tape Other (See Comments)    [place] - please use paper tape.  . Sulfa Antibiotics Rash  . Sulfa Antibiotics Rash    Current Medications: Current Outpatient Medications  Medication Sig Dispense Refill  . acetaminophen (TYLENOL) 325 MG tablet Take 650 mg by mouth every 6 (six) hours as needed.    Marland Kitchen aspirin 81 MG tablet Take 1 tablet by mouth daily.    . Calcium-Magnesium 500-250 MG TABS Take 2 tablets by mouth daily.     . Coenzyme Q10 (CO Q-10) 100 MG CAPS Take by mouth daily.    . diphenoxylate-atropine (LOMOTIL) 2.5-0.025 MG tablet Take 1 tablet by mouth 4 (four) times daily as needed for diarrhea or loose stools. 30 tablet 0  . ezetimibe (ZETIA) 10 MG tablet Take 1 tablet (10 mg  total) by mouth daily. 90 tablet 3  . gabapentin (NEURONTIN) 300 MG capsule Take 1 capsule (300 mg total) by mouth 2 (two) times daily. 540 capsule 1  . lisinopril-hydrochlorothiazide (PRINZIDE,ZESTORETIC) 20-25 MG tablet Take 1.5 tablets by mouth daily. 135 tablet 3  . meloxicam (MOBIC) 15 MG tablet Take 1 tablet (15 mg total) by mouth daily. 90 tablet 1  . Multiple Vitamins-Minerals (MULTIVITAMIN WITH MINERALS) tablet Take 1 tablet by mouth daily.    Marland Kitchen nystatin (MYCOSTATIN/NYSTOP) powder Apply topically 4 (four) times daily. 15 g 3  . omeprazole (PRILOSEC) 20 MG capsule TAKE 1 CAPSULE BY MOUTH  DAILY 90 capsule 1  . propranolol (INDERAL) 10 MG tablet Take 1 tablet (10 mg total) by mouth 3 (three) times daily as needed. 90 tablet 3  . traMADol (ULTRAM) 50 MG tablet TAKE 1/2 TO 1 TABLET BY  MOUTH EVERY 8 HOURS AS  NEEDED FOR PAIN 90 tablet 0  . clindamycin (CLEOCIN) 300 MG capsule Take 300 mg by mouth once. Prior to dental appointment     No current facility-administered medications for this visit.     Review of Systems  Constitutional: Positive for weight loss (down 3 pounds). Negative for chills, diaphoresis, fever and malaise/fatigue.       Feels "ok".  Kind of tired with recent family in town for the holidays.  HENT: Negative for congestion, ear discharge, ear pain, nosebleeds, sinus pain and sore throat.        Dental issues; has been seen by multiple providers.  Eyes: Negative.  Negative for double vision, photophobia and pain.  Respiratory: Negative.  Negative for cough, hemoptysis, sputum production and shortness of breath.   Cardiovascular: Positive for leg swelling. Negative for chest pain, palpitations, orthopnea and PND.  Gastrointestinal: Negative.  Negative for abdominal pain, blood in stool, constipation, diarrhea, melena, nausea and vomiting.  Genitourinary: Negative.  Negative for dysuria, frequency, hematuria and urgency.  Musculoskeletal: Positive for back pain and joint  pain (LEFT knee and hip pain). Negative for falls, myalgias and neck pain.  Skin: Negative.  Negative for itching and rash.  Neurological: Positive for sensory change (neuropathy on gabapentin). Negative for dizziness, tremors, focal weakness, weakness and headaches.       Balance issues.  Endo/Heme/Allergies: Negative.  Does not bruise/bleed easily.  Psychiatric/Behavioral: Negative for depression, memory loss and suicidal ideas. The patient is not nervous/anxious and does not have insomnia.   All other systems reviewed and are negative.  Performance status (ECOG): 2  Vital Signs BP 118/72 (BP Location: Right Arm, Patient Position: Sitting)   Pulse 84   Temp  98.4 F (36.9 C) (Tympanic)   Resp 18   Wt 199 lb (90.3 kg)   BMI 34.16 kg/m   Physical Exam  Constitutional: She is oriented to person, place, and time.  Elderly woman sitting in the exam room in no acute distress.  She requires assistance onto the exam table.  She has a rolling walker at her side.  HENT:  Head: Normocephalic and atraumatic.  Mouth/Throat: Oropharynx is clear and moist and mucous membranes are normal. No oropharyngeal exudate.  Shoulder length gray hair.  Eyes: Pupils are equal, round, and reactive to light. Conjunctivae and EOM are normal. No scleral icterus.  Neck: Normal range of motion. Neck supple. No JVD present.  Cardiovascular: Normal rate, regular rhythm, normal heart sounds and intact distal pulses. Exam reveals no gallop and no friction rub.  No murmur heard. Pulmonary/Chest: Effort normal and breath sounds normal. No respiratory distress. She has no wheezes. She has no rales. Left breast exhibits tenderness. Breast asymmetry: left breast post operative changes.  Abdominal: Soft. Bowel sounds are normal. She exhibits no distension and no mass. There is no abdominal tenderness. There is no rebound and no guarding.  Musculoskeletal: Normal range of motion.        General: Edema (2+ bilateral lower  extremity edema) present.     Left hip: She exhibits bony tenderness.     Comments: Right paraspinal tenderness.  Lymphadenopathy:    She has no cervical adenopathy.    She has no axillary adenopathy.       Right: No inguinal and no supraclavicular adenopathy present.       Left: No inguinal and no supraclavicular adenopathy present.  Neurological: She is alert and oriented to person, place, and time.  Skin: Skin is warm and dry. No rash noted. No erythema. No pallor.  Psychiatric: Mood, affect and judgment normal.  Nursing note and vitals reviewed.   Results for orders placed or performed in visit on 11/09/18 (from the past 336 hour(s))  Comprehensive metabolic panel   Collection Time: 11/09/18 12:48 PM  Result Value Ref Range   Sodium 132 (L) 135 - 145 mmol/L   Potassium 4.0 3.5 - 5.1 mmol/L   Chloride 100 98 - 111 mmol/L   CO2 24 22 - 32 mmol/L   Glucose, Bld 124 (H) 70 - 99 mg/dL   BUN 23 8 - 23 mg/dL   Creatinine, Ser 0.83 0.44 - 1.00 mg/dL   Calcium 9.0 8.9 - 10.3 mg/dL   Total Protein 7.3 6.5 - 8.1 g/dL   Albumin 3.9 3.5 - 5.0 g/dL   AST 21 15 - 41 U/L   ALT 14 0 - 44 U/L   Alkaline Phosphatase 57 38 - 126 U/L   Total Bilirubin 0.4 0.3 - 1.2 mg/dL   GFR calc non Af Amer >60 >60 mL/min   GFR calc Af Amer >60 >60 mL/min   Anion gap 8 5 - 15  CBC with Differential   Collection Time: 11/09/18 12:48 PM  Result Value Ref Range   WBC 5.9 4.0 - 10.5 K/uL   RBC 4.04 3.87 - 5.11 MIL/uL   Hemoglobin 11.8 (L) 12.0 - 15.0 g/dL   HCT 35.2 (L) 36.0 - 46.0 %   MCV 87.1 80.0 - 100.0 fL   MCH 29.2 26.0 - 34.0 pg   MCHC 33.5 30.0 - 36.0 g/dL   RDW 14.2 11.5 - 15.5 %   Platelets 199 150 - 400 K/uL   nRBC 0.0 0.0 -  0.2 %   Neutrophils Relative % 66 %   Neutro Abs 3.9 1.7 - 7.7 K/uL   Lymphocytes Relative 19 %   Lymphs Abs 1.1 0.7 - 4.0 K/uL   Monocytes Relative 8 %   Monocytes Absolute 0.5 0.1 - 1.0 K/uL   Eosinophils Relative 6 %   Eosinophils Absolute 0.3 0.0 - 0.5 K/uL    Basophils Relative 1 %   Basophils Absolute 0.0 0.0 - 0.1 K/uL   Immature Granulocytes 0 %   Abs Immature Granulocytes 0.02 0.00 - 0.07 K/uL      Imaging studies: 09/16/2017:  PET scan revealed widespread scattered hypermetabolic lytic osseous metastases throughout the axial and proximal appendicular skeleton.  Bilateral proximal femoral skeletal metastases placed the patient at risk for pathologic hip fractures.  There was mild hypermetabolic right hilar lymphadenopathy.   09/23/2017:  Plain films of the bilateral femurs revealed no pathologic fracture. The LEFT view showed lytic subtrochanteric metastasis that was previously seen on PET scan.  06/01/2018:  PET scan demonstrated marked improvement in the osseous metastatic disease.  Previously demonstrated areas of abnormally high metabolic activity have resolved, with the exception of a very faint area of residual activity (SUV 2.6; previously 11.5) in the LEFT subtrochanteric region. Previous LEFT axillary fluid collection now shows a 1.8 x 1.2 cm scarlike density with low-grade activity (SUV 2.7). Incidental findings include a small hiatal hernia and diverticulosis of the sigmoid colon.  02/11/2018:  Lumbar spine MRI revealed multifocal lumbosacral vertebral body signal abnormalities consistent with osseous metastatic disease. The largest lesion was at L1.  There was no pathologic compression fracture. There was multi-level moderate to severe neural foraminal stenosis, which had progressed since the prior study. 09/26/2018:  Sacral MRI revealed scattered small marrow lesions c/w metastatic disease. The 0.6 cm lesion in the S1 vertebral body was new since the comparison lumbar spine MRI. Small lesion in the left ilium adjacent to the SI joint was present on the prior MRI. The remaining lesions were not included on the prior MRI.  There was no fracture or other acute abnormality.  There was lower lumbar spondylosis and diverticulosis. 10/15/2018:  PET  scan revealed no evidence of active breast cancer metastasis on FDG PET scan.  There were stable lytic lesions in the spine without evidence of metabolic activity consistent treated metastasis.   Assessment:  Kelli Williams is a 84 y.o. female with metastatic breast cancer s/p left axillary biopsy on 07/10/2017.  Pathology revealed a 1.1 cm grade II invasive mammary carcinoma with focal lobular features.   Wide excision on 08/20/2017 revealed metastatic mammary carcinoma involving 1 of 4 axillary lymph nodes with extracapsular extension.  The superior medial margin was involved.  Tumor was ER + (> 90%), PR + (> 90%), and Her2/neu 2+ (FISH -).   [facility] genetic testing on 09/26/2017 revealed no pathogenic sequence variants or deletions/duplications.  BRCA1/2 testing on 09/26/2017 was negative.  She has a history of left breast cancer s/p wide excision and sentinel lymph node biopsy on 07/18/1999 at [facility].  Pathology revealed a 0.8 x 0.6 x 0.4 cm grade II invasive ductal adenocarcinoma.  There was no lymphovascular invasion.  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  Margins were negative.  Tumor was ER + (50%) and PR + (90%).  Pathologic stage was T1bN0.  She received 6200 cGy from 08/21/1999 - 10/03/1999.  She completed 5 years of tamoxifen in 06/2004.    PET scan on 09/16/2017 revealed widespread scattered  hypermetabolic lytic osseous metastases throughout the axial and proximal appendicular skeleton.  Bilateral proximal femoral skeletal metastases placed the patient at risk for pathologic hip fractures.  There was mild hypermetabolic right hilar lymphadenopathy.  Plain films of the bilateral femurs on 09/23/2017 revealed no pathologic fracture. The LEFT view showed lytic subtrochanteric metastasis that was previously seen on PET scan.   PET scan on 10/15/2018 revealed no evidence of active breast cancer metastasis on FDG PET scan.  There were stable lytic lesions in the spine without evidence  of metabolic activity consistent treated metastasis.  CA27.29 has been followed:  14.7 on 02/05/2017, 19.1 on 02/06/2012, 172.2 on 09/04/2017, 144.9 on 11/06/2017, 73.4 on 02/05/2018, 63.3 on 03/06/2018, 47.2 on 04/06/2018, 42.4 on 05/07/2018, 36.6 on 06/08/2018, 34.6 on 07/06/2018, 27.1 on 08/06/2018, 28.2 on 09/10/2018,  26.0 on 10/09/2018, and 30.9 on 11/09/2018.  She received a palliative course of radiation (3000 cGy in 10 fractions) to the left hip from 10/14/2017 - 10/27/2017.  She received a course of palliative radiation the the lumbar spine and SI joints from 03/04/2018 - 03/27/2018.  She began Faslodex on 09/26/2017 (last 10/09/2018).  She began monthly Xgeva on 09/26/2017 (last 05/07/2018).  She has dental issues.  Bone density on 09/01/2017 revealed osteopenia with a T-score of -1.9 in the right femoral neck and left forearm. She is on calcium and vitamin D.  Symptomatically, she is doing well.  She denies any breast concerns.  Exam is stable.  Plan: 1. Labs today:  CBC with diff, CMP, CA27.29. 2. Metastatic breast cancer Clinically doing well. CA27.29 is normal. PET scan on 10/15/2018 personally reviewed.  No evidence of active bone disease. Continue Faslodex (due today). 3. Bone metastasis  Sacral MRI revealed scattered small marrow lesions consistent with metastatic disease Continue to hold Xgeva until dental issues resolved. Continue supplemental calcium and vitamin D daily. 4. Dental issues Ongoing dental issues.   Patient has been seen by multiple providers She has been referred to [place] [facility] for extraction.  5. Discuss transition to [facility].  Patient wishes to go to [facility].  Would like van tranaportation. 6. RTC in 4 weeks for MD assessment in [place], labs (CBC with diff, CMP, CA27.29), and Faslodex. 7. RTC in 8 weeks for MD assessment in [place], labs (CBC with diff, CMP, CA27.29), and Faslodex.   Lequita Asal, MD 11/09/18, 1:51 PM

## 2018-11-09 NOTE — Progress Notes (Signed)
Pt in for follow up, has concerns regarding elevated thyroid level from last visit.  Pt reports stop taking biotin due to elevation.

## 2018-11-10 LAB — CANCER ANTIGEN 27.29: CA 27.29: 30.9 U/mL (ref 0.0–38.6)

## 2018-11-24 ENCOUNTER — Other Ambulatory Visit: Payer: Self-pay | Admitting: Hematology and Oncology

## 2018-11-24 ENCOUNTER — Other Ambulatory Visit: Payer: Self-pay | Admitting: Internal Medicine

## 2018-11-24 DIAGNOSIS — K589 Irritable bowel syndrome without diarrhea: Secondary | ICD-10-CM

## 2018-11-26 ENCOUNTER — Other Ambulatory Visit: Payer: Self-pay | Admitting: Internal Medicine

## 2018-11-26 ENCOUNTER — Telehealth: Payer: Self-pay

## 2018-11-26 DIAGNOSIS — G5793 Unspecified mononeuropathy of bilateral lower limbs: Secondary | ICD-10-CM

## 2018-11-26 DIAGNOSIS — E782 Mixed hyperlipidemia: Secondary | ICD-10-CM

## 2018-11-26 MED ORDER — GABAPENTIN 300 MG PO CAPS: 900.0000 mg | ORAL_CAPSULE | Freq: Two times a day (BID) | ORAL | 1 refills | Status: DC

## 2018-11-26 NOTE — Telephone Encounter (Signed)
Patient called stating she noticed she has two appts in the same day. On with Dr Army Melia and one for oncology/chemo treatment on 12/07/2018. Called to ask if she can have her lipid panel done that morning since she is having her oncology labs done too.  Also, she called to asked why the instructions on her gabapentin were changed from taking 3 caps TID to 1 capsule BID.   Spoke with Dr Army Melia and called and spoke with patient. Dr Army Melia ordered a future order for her to have her lipid panel done with her oncology labs. Patient said she will not be feeling up to coming to [facility] after chemo treatment so she wants to reschedule appt. Informed her the instructions on her gabapentin was a computer error and Dr B sent in new Rx with correct instructions since she was out currently. Sent patient to the front desk to reschedule her upcoming appt with [place] and told her I will call her with her lipid lab results.

## 2018-12-07 ENCOUNTER — Inpatient Hospital Stay: Payer: Medicare Other

## 2018-12-07 ENCOUNTER — Inpatient Hospital Stay: Payer: Medicare Other | Admitting: Oncology

## 2018-12-07 ENCOUNTER — Other Ambulatory Visit: Payer: Self-pay

## 2018-12-07 ENCOUNTER — Ambulatory Visit: Payer: Medicare Other | Admitting: Internal Medicine

## 2018-12-07 ENCOUNTER — Encounter: Payer: Self-pay | Admitting: Oncology

## 2018-12-07 VITALS — BP 150/74 | HR 61 | Temp 96.9°F | Resp 18 | Wt 197.8 lb

## 2018-12-07 DIAGNOSIS — E782 Mixed hyperlipidemia: Secondary | ICD-10-CM

## 2018-12-07 DIAGNOSIS — M8580 Other specified disorders of bone density and structure, unspecified site: Secondary | ICD-10-CM | POA: Diagnosis not present

## 2018-12-07 DIAGNOSIS — C50412 Malignant neoplasm of upper-outer quadrant of left female breast: Secondary | ICD-10-CM

## 2018-12-07 DIAGNOSIS — C7951 Secondary malignant neoplasm of bone: Secondary | ICD-10-CM

## 2018-12-07 DIAGNOSIS — K0889 Other specified disorders of teeth and supporting structures: Secondary | ICD-10-CM | POA: Diagnosis not present

## 2018-12-07 DIAGNOSIS — C50411 Malignant neoplasm of upper-outer quadrant of right female breast: Secondary | ICD-10-CM | POA: Diagnosis not present

## 2018-12-07 DIAGNOSIS — K029 Dental caries, unspecified: Secondary | ICD-10-CM

## 2018-12-07 DIAGNOSIS — Z170 Estrogen receptor positive status [ER+]: Secondary | ICD-10-CM

## 2018-12-07 DIAGNOSIS — C773 Secondary and unspecified malignant neoplasm of axilla and upper limb lymph nodes: Secondary | ICD-10-CM

## 2018-12-07 DIAGNOSIS — I10 Essential (primary) hypertension: Secondary | ICD-10-CM

## 2018-12-07 DIAGNOSIS — M85851 Other specified disorders of bone density and structure, right thigh: Secondary | ICD-10-CM

## 2018-12-07 DIAGNOSIS — Z5111 Encounter for antineoplastic chemotherapy: Secondary | ICD-10-CM | POA: Diagnosis not present

## 2018-12-07 LAB — CBC WITH DIFFERENTIAL/PLATELET
Abs Immature Granulocytes: 0.03 10*3/uL (ref 0.00–0.07)
Basophils Absolute: 0.1 10*3/uL (ref 0.0–0.1)
Basophils Relative: 1 %
Eosinophils Absolute: 0.4 10*3/uL (ref 0.0–0.5)
Eosinophils Relative: 8 %
HCT: 36.1 % (ref 36.0–46.0)
Hemoglobin: 11.8 g/dL — ABNORMAL LOW (ref 12.0–15.0)
Immature Granulocytes: 1 %
Lymphocytes Relative: 18 %
Lymphs Abs: 0.9 10*3/uL (ref 0.7–4.0)
MCH: 29.1 pg (ref 26.0–34.0)
MCHC: 32.7 g/dL (ref 30.0–36.0)
MCV: 88.9 fL (ref 80.0–100.0)
Monocytes Absolute: 0.5 10*3/uL (ref 0.1–1.0)
Monocytes Relative: 9 %
Neutro Abs: 3.3 10*3/uL (ref 1.7–7.7)
Neutrophils Relative %: 63 %
Platelets: 202 10*3/uL (ref 150–400)
RBC: 4.06 MIL/uL (ref 3.87–5.11)
RDW: 14.5 % (ref 11.5–15.5)
WBC: 5.1 10*3/uL (ref 4.0–10.5)
nRBC: 0 % (ref 0.0–0.2)

## 2018-12-07 LAB — COMPREHENSIVE METABOLIC PANEL
ALT: 13 U/L (ref 0–44)
AST: 20 U/L (ref 15–41)
Albumin: 3.9 g/dL (ref 3.5–5.0)
Alkaline Phosphatase: 54 U/L (ref 38–126)
Anion gap: 8 (ref 5–15)
BUN: 25 mg/dL — ABNORMAL HIGH (ref 8–23)
CO2: 32 mmol/L (ref 22–32)
Calcium: 9.2 mg/dL (ref 8.9–10.3)
Chloride: 99 mmol/L (ref 98–111)
Creatinine, Ser: 0.91 mg/dL (ref 0.44–1.00)
GFR calc Af Amer: >60 mL/min (ref >60)
GFR calc non Af Amer: 56 mL/min — ABNORMAL LOW (ref >60)
Glucose, Bld: 103 mg/dL — ABNORMAL HIGH (ref 70–99)
Potassium: 3.8 mmol/L (ref 3.5–5.1)
Sodium: 139 mmol/L (ref 135–145)
Total Bilirubin: 0.6 mg/dL (ref 0.3–1.2)
Total Protein: 7.2 g/dL (ref 6.5–8.1)

## 2018-12-07 LAB — LIPID PANEL
Cholesterol: 140 mg/dL (ref 0–200)
HDL: 50 mg/dL (ref >40)
LDL Cholesterol: 71 mg/dL (ref 0–99)
Total CHOL/HDL Ratio: 2.8 RATIO
Triglycerides: 93 mg/dL (ref <150)
VLDL: 19 mg/dL (ref 0–40)

## 2018-12-07 MED ORDER — FULVESTRANT 250 MG/5ML IM SOLN
500.0000 mg | Freq: Once | INTRAMUSCULAR | Status: AC
  Administered 2018-12-07: 500 mg via INTRAMUSCULAR
  Filled 2018-12-07: qty 10

## 2018-12-07 NOTE — Progress Notes (Signed)
Patient here for follow up. Pt concerned about T4 level and tumor marker reading.

## 2018-12-07 NOTE — Progress Notes (Signed)
[facility] day:  12/07/2018   Chief Complaint: Kelli Williams is a 84 y.o. female with metastatic breast cancer who is seen for a review of interval PET scan and 1 month assessment on Faslodex.  HPI:  The patient was last seen in the medical oncology clinic on 10/09/2018.  At that time, she was not feeling well. She was upset because of her weight.  She had chronic pain in her back, hips, and knees. She was having ongoing dental issues despite being referred to multiple providers. Patient was scheduled to see Dr. Aleda Grana next. Exam revealed lumbosacral tenderness, mainly overly the SI joint. CA27.29 was normal.  TSH was 2.045 (normal) and free T4 3.06 (0.82 - 1.77).  PET scan on 10/15/2018 revealed no evidence of active breast cancer metastasis on FDG PET scan.  There were stable lytic lesions in the spine without evidence of metabolic activity consistent treated metastasis.    INTERVAL HISTORY Kelli Williams is a 84 y.o. female who has above history reviewed by me today presents for follow up visit for management of metastatic breast cancer.  She is on Faslodex treatments. Delton See has been held due to dental issues. Patient follows up with Dr. Mike Gip who is away this week.  I am covering Dr. Susy Manor to see this patient today. Patient tells me that she does not want to see Dr. Harlon Flor nurse practitioner Gaspar Bidding. She says " he does not look at me".  She saw the CA 2 7.29 level that was done at last visit has increased.  She is very concerned and feels anxious.  Per Dr. Kristopher Oppenheim note, she was advised to see endodontist for evaluation of her dental problem.  Reports being seen by endodontist and she was given a few options including tooth extraction, root canal, bridging, " but there is no guarantee". She has not decided what she needs to do.  She complains not finding any good dentist locally. " I have been so many places and I am always happy with  my dentist except the ones here:"  Continue to have upper and lower tooth pain.   Past Medical History:  Diagnosis Date  . Anxiety   . Arthritis    BOTH FEET AND KNEES  . Breast cancer ([place]) 2000   left breast ca with lumpectomy and rad tx 5 yr tamoxifen/Dr Jeannine Kitten at [place]  . Complication of anesthesia    WOKE UP DURING SURGERY FOR DEVIATED SEPTUM, KNEE REPLACEMENT AND BREAST LUMPECTOMY  . Dysrhythmia   . GERD (gastroesophageal reflux disease)   . Hypertension   . Neuropathic pain of both legs   . Neuropathy   . Pneumonia 2016  . Skin cancer of forehead   . Skin cancer of nose     Past Surgical History:  Procedure Laterality Date  . ABDOMINAL HYSTERECTOMY    . AXILLARY LYMPH NODE BIOPSY Left 07/10/2017   INVASIVE MAMMARY CARCINOMA WITH FOCAL LOBULAR FEATURES.   Marland Kitchen BREAST BIOPSY Bilateral 1960   benign  . BREAST LUMPECTOMY Left 2000   breast ca with rad tx  . BREAST LUMPECTOMY Left 08/20/2017  . BREAST LUMPECTOMY Left 08/20/2017   Procedure: left breast wide excision;  Surgeon: Robert Bellow, MD;  Location: [facility] ORS;  Service: General;  Laterality: Left;  . CATARACT EXTRACTION     left eye  . CHOLECYSTECTOMY    . JOINT REPLACEMENT    . NASAL SEPTUM SURGERY    . PARTIAL  KNEE ARTHROPLASTY     right  . TOTAL VAGINAL HYSTERECTOMY      Family History  Problem Relation Age of Onset  . Heart disease Mother   . Heart attack Paternal Uncle   . Breast cancer Daughter 24       genetic negative  . Cancer Paternal Grandmother     Social History:  reports that she has never smoked. She has never used smokeless tobacco. She reports that she does not drink alcohol or use drugs.  The patient's husband died 2 years ago.  She was born outside of [place].  She previously worked as an Web designer.  She lives at the [facility] [place].  She is independent except for the use of her walker. Patient has a daughter, Kelli Williams.  The patient is alone today.  Allergies:   Allergies  Allergen Reactions  . Amitriptyline Hives  . Amoxicillin     Diarrhea    . Ivp Dye [Iodinated Diagnostic Agents]   . Tape Other (See Comments)    [place] - please use paper tape.  . Sulfa Antibiotics Rash  . Sulfa Antibiotics Rash    Current Medications: Current Outpatient Medications  Medication Sig Dispense Refill  . acetaminophen (TYLENOL) 325 MG tablet Take 650 mg by mouth every 6 (six) hours as needed.    Marland Kitchen aspirin 81 MG tablet Take 1 tablet by mouth daily.    . Calcium-Magnesium 500-250 MG TABS Take 2 tablets by mouth daily.     . clindamycin (CLEOCIN) 300 MG capsule Take 300 mg by mouth once. Prior to dental appointment    . Coenzyme Q10 (CO Q-10) 100 MG CAPS Take by mouth daily.    . diphenoxylate-atropine (LOMOTIL) 2.5-0.025 MG tablet Take 1 tablet by mouth 4 (four) times daily as needed for diarrhea or loose stools. 30 tablet 0  . ezetimibe (ZETIA) 10 MG tablet Take 1 tablet (10 mg total) by mouth daily. 90 tablet 3  . gabapentin (NEURONTIN) 300 MG capsule Take 3 capsules (900 mg total) by mouth 2 (two) times daily. 540 capsule 1  . lisinopril-hydrochlorothiazide (PRINZIDE,ZESTORETIC) 20-25 MG tablet Take 1.5 tablets by mouth daily. 135 tablet 3  . meloxicam (MOBIC) 15 MG tablet Take 1 tablet (15 mg total) by mouth daily. 90 tablet 1  . Multiple Vitamins-Minerals (MULTIVITAMIN WITH MINERALS) tablet Take 1 tablet by mouth daily.    Marland Kitchen nystatin (MYCOSTATIN/NYSTOP) powder Apply topically 4 (four) times daily. 15 g 3  . omeprazole (PRILOSEC) 20 MG capsule TAKE 1 CAPSULE BY MOUTH  DAILY 90 capsule 1  . propranolol (INDERAL) 10 MG tablet Take 1 tablet (10 mg total) by mouth 3 (three) times daily as needed. 90 tablet 3  . traMADol (ULTRAM) 50 MG tablet TAKE 1/2 TO 1 TABLET BY  MOUTH EVERY 8 HOURS AS  NEEDED FOR PAIN 90 tablet 0   No current facility-administered medications for this visit.     Review of Systems  Constitutional: Negative for chills, diaphoresis,  fever, malaise/fatigue and weight loss (up 2 pounds).       "I just don't feel good".   HENT: Negative for sinus pain and sore throat.        Dental issues; has been seen by multiple providers.  Eyes: Negative.  Negative for redness.  Respiratory: Negative for cough, hemoptysis, sputum production, shortness of breath and wheezing.   Cardiovascular: Positive for leg swelling. Negative for chest pain, palpitations, orthopnea and PND.  Gastrointestinal: Negative for abdominal pain, blood  in stool, constipation, diarrhea, melena, nausea and vomiting.  Genitourinary: Negative for dysuria, frequency, hematuria and urgency.  Musculoskeletal: Positive for back pain and joint pain (LEFT knee and hip pain; LLE numbness). Negative for falls and myalgias.  Skin: Negative for itching and rash.  Neurological: Positive for sensory change (neuropathy on gabapentin). Negative for dizziness, tingling, tremors, weakness and headaches.       Balance issues  Endo/Heme/Allergies: Does not bruise/bleed easily.  Psychiatric/Behavioral: Negative for depression, hallucinations, memory loss and suicidal ideas. The patient is not nervous/anxious and does not have insomnia.   All other systems reviewed and are negative.  Performance status (ECOG): 2 - Symptomatic, <50% confined to bed  Vital Signs BP (!) 150/74 (BP Location: Right Arm)   Pulse 61   Temp (!) 96.9 F (36.1 C) (Tympanic)   Resp 18   Wt 197 lb 12.8 oz (89.7 kg)   BMI 33.95 kg/m   Physical Exam  Constitutional: She is oriented to person, place, and time and well-developed, well-nourished, and in no distress. No distress.  HENT:  Head: Normocephalic and atraumatic.  Mouth/Throat: Mucous membranes are normal. No oropharyngeal exudate.  Eyes: Pupils are equal, round, and reactive to light. EOM are normal. No scleral icterus.  Neck: Normal range of motion. Neck supple. No tracheal deviation present. No thyromegaly present.  Cardiovascular: Normal  rate, regular rhythm and intact distal pulses. Exam reveals no gallop and no friction rub.  No murmur heard. Pulmonary/Chest: Effort normal and breath sounds normal. No respiratory distress. She has no wheezes. She has no rales. She exhibits no tenderness.  Abdominal: Soft. Bowel sounds are normal. She exhibits no distension. There is no abdominal tenderness.  Musculoskeletal: Normal range of motion.        General: Edema (BLE) present.     Left hip: She exhibits bony tenderness.     Right knee: Tenderness found.     Left knee: Tenderness found.     Comments: Lumbosacral tenderness. (+) pain over SI joint.  Lymphadenopathy:    She has no cervical adenopathy.    She has no axillary adenopathy.       Right: No supraclavicular adenopathy present.       Left: No supraclavicular adenopathy present.  Neurological: She is alert and oriented to person, place, and time. No cranial nerve deficit. She exhibits normal muscle tone. Coordination normal.  Skin: Skin is warm and dry. No rash noted. She is not diaphoretic. No erythema.  Psychiatric: Mood, affect and judgment normal.  Nursing note and vitals reviewed.   Results for orders placed or performed in visit on 12/07/18 (from the past 336 hour(s))  Comprehensive metabolic panel   Collection Time: 12/07/18  9:35 AM  Result Value Ref Range   Sodium 139 135 - 145 mmol/L   Potassium 3.8 3.5 - 5.1 mmol/L   Chloride 99 98 - 111 mmol/L   CO2 32 22 - 32 mmol/L   Glucose, Bld 103 (H) 70 - 99 mg/dL   BUN 25 (H) 8 - 23 mg/dL   Creatinine, Ser 0.91 0.44 - 1.00 mg/dL   Calcium 9.2 8.9 - 10.3 mg/dL   Total Protein 7.2 6.5 - 8.1 g/dL   Albumin 3.9 3.5 - 5.0 g/dL   AST 20 15 - 41 U/L   ALT 13 0 - 44 U/L   Alkaline Phosphatase 54 38 - 126 U/L   Total Bilirubin 0.6 0.3 - 1.2 mg/dL   GFR calc non Af Amer 56 (L) >60 mL/min  GFR calc Af Amer >60 >60 mL/min   Anion gap 8 5 - 15  CBC with Differential/Platelet   Collection Time: 12/07/18  9:35 AM   Result Value Ref Range   WBC 5.1 4.0 - 10.5 K/uL   RBC 4.06 3.87 - 5.11 MIL/uL   Hemoglobin 11.8 (L) 12.0 - 15.0 g/dL   HCT 36.1 36.0 - 46.0 %   MCV 88.9 80.0 - 100.0 fL   MCH 29.1 26.0 - 34.0 pg   MCHC 32.7 30.0 - 36.0 g/dL   RDW 14.5 11.5 - 15.5 %   Platelets 202 150 - 400 K/uL   nRBC 0.0 0.0 - 0.2 %   Neutrophils Relative % 63 %   Neutro Abs 3.3 1.7 - 7.7 K/uL   Lymphocytes Relative 18 %   Lymphs Abs 0.9 0.7 - 4.0 K/uL   Monocytes Relative 9 %   Monocytes Absolute 0.5 0.1 - 1.0 K/uL   Eosinophils Relative 8 %   Eosinophils Absolute 0.4 0.0 - 0.5 K/uL   Basophils Relative 1 %   Basophils Absolute 0.1 0.0 - 0.1 K/uL   Immature Granulocytes 1 %   Abs Immature Granulocytes 0.03 0.00 - 0.07 K/uL  Lipid panel   Collection Time: 12/07/18  9:45 AM  Result Value Ref Range   Cholesterol 140 0 - 200 mg/dL   Triglycerides 93 <150 mg/dL   HDL 50 >40 mg/dL   Total CHOL/HDL Ratio 2.8 RATIO   VLDL 19 0 - 40 mg/dL   LDL Cholesterol 71 0 - 99 mg/dL      Imaging studies: 09/16/2017:  PET scan revealed widespread scattered hypermetabolic lytic osseous metastases throughout the axial and proximal appendicular skeleton.  Bilateral proximal femoral skeletal metastases placed the patient at risk for pathologic hip fractures.  There was mild hypermetabolic right hilar lymphadenopathy.   09/23/2017:  Plain films of the bilateral femurs revealed no pathologic fracture. The LEFT view showed lytic subtrochanteric metastasis that was previously seen on PET scan.  06/01/2018:  PET scan demonstrated marked improvement in the osseous metastatic disease.  Previously demonstrated areas of abnormally high metabolic activity have resolved, with the exception of a very faint area of residual activity (SUV 2.6; previously 11.5) in the LEFT subtrochanteric region. Previous LEFT axillary fluid collection now shows a 1.8 x 1.2 cm scarlike density with low-grade activity (SUV 2.7). Incidental findings include a  small hiatal hernia and diverticulosis of the sigmoid colon.  02/11/2018:  Lumbar spine MRI revealed multifocal lumbosacral vertebral body signal abnormalities consistent with osseous metastatic disease. The largest lesion was at L1.  There was no pathologic compression fracture. There was multi-level moderate to severe neural foraminal stenosis, which had progressed since the prior study. 09/26/2018:  Sacral MRI revealed scattered small marrow lesions c/w metastatic disease. The 0.6 cm lesion in the S1 vertebral body was new since the comparison lumbar spine MRI. Small lesion in the left ilium adjacent to the SI joint was present on the prior MRI. The remaining lesions were not included on the prior MRI.  There was no fracture or other acute abnormality.  There was lower lumbar spondylosis and diverticulosis. 10/15/2018:  PET scan revealed no evidence of active breast cancer metastasis on FDG PET scan.  There were stable lytic lesions in the spine without evidence of metabolic activity consistent treated metastasis.   Assessment:  Kelli Williams is a 85 y.o. female with metastatic breast cancer s/p left axillary biopsy on 07/10/2017.  Pathology revealed a 1.1 cm  grade II invasive mammary carcinoma with focal lobular features.   Wide excision on 08/20/2017 revealed metastatic mammary carcinoma involving 1 of 4 axillary lymph nodes with extracapsular extension.  The superior medial margin was involved.  Tumor was ER + (> 90%), PR + (> 90%), and Her2/neu 2+ (FISH -).   [facility] genetic testing on 09/26/2017 revealed no pathogenic sequence variants or deletions/duplications.  BRCA1/2 testing on 09/26/2017 was negative.  She has a history of left breast cancer s/p wide excision and sentinel lymph node biopsy on 07/18/1999 at [facility].  Pathology revealed a 0.8 x 0.6 x 0.4 cm grade II invasive ductal adenocarcinoma.  There was no lymphovascular invasion.  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  Margins  were negative.  Tumor was ER + (50%) and PR + (90%).  Pathologic stage was T1bN0.  She received 6200 cGy from 08/21/1999 - 10/03/1999.  She completed 5 years of tamoxifen in 06/2004.    PET scan on 09/16/2017 revealed widespread scattered hypermetabolic lytic osseous metastases throughout the axial and proximal appendicular skeleton.  Bilateral proximal femoral skeletal metastases placed the patient at risk for pathologic hip fractures.  There was mild hypermetabolic right hilar lymphadenopathy.  Plain films of the bilateral femurs on 09/23/2017 revealed no pathologic fracture. The LEFT view showed lytic subtrochanteric metastasis that was previously seen on PET scan.   PET scan on 10/15/2018 revealed no evidence of active breast cancer metastasis on FDG PET scan.  There were stable lytic lesions in the spine without evidence of metabolic activity consistent treated metastasis.  CA27.29 has been followed:  14.7 on 02/05/2017, 19.1 on 02/06/2012, 172.2 on 09/04/2017, 144.9 on 11/06/2017, 73.4 on 02/05/2018, 63.3 on 03/06/2018, 47.2 on 04/06/2018, 42.4 on 05/07/2018, 36.6 on 06/08/2018, 34.6 on 07/06/2018, 27.1 on 08/06/2018, 28.2 on 09/10/2018, and 26.0 on 10/09/2018.  She received a palliative course of radiation (3000 cGy in 10 fractions) to the left hip from 10/14/2017 - 10/27/2017.  She received a course of palliative radiation the the lumbar spine and SI joints from 03/04/2018 - 03/27/2018.  She began Faslodex on 09/26/2017 (last 10/09/2018).  She began monthly Xgeva on 09/26/2017 (last 05/07/2018).  She has dental issues.  Bone density on 09/01/2017 revealed osteopenia with a T-score of -1.9 in the right femoral neck and left forearm. She is on calcium and vitamin D.  Symptomatically,   Plan: 1. Labs today:  CBC with diff, CMP, CA27.29.   2. Metastatic breast cancer  Reports doing well.  No breast concerns.  CA27.29 slightly trended up at 30.9 u/mL at last visit.  Today's levels are  pending.  10/15/2018 PET scan was independent reviewed and discussed with patient.  No evidence of active breast cancer metastasis.  Stable lytic lesion in the spine without evidence of metastatic activity consistent with treated metastasis.  Discussed with patient that her CA-27-29 is still relatively stable, comparing to her previous elevated levels.  Continue with current treatment with Faslodex.  3. Bone metastasis  Review sacral MRI. Imaging personally reviewed and felt to be consistent with the dictated radiology report. Imaging reviewed with patient.   Scattered small marrow lesions consistent with metastatic disease  New 0.6 cm lesion in the S1 vertebral body since the previous lumbar spine MRI back in 02/2018.  Patient has ongoing dental problems.  Continue holding Xgeva.  Continue calcium and vitamin D daily. 4. Dental issues  Ongoing dental issues.    Patient has been seen by multiple providers, however she has found reasons  to delay care/intervention with each one.   Discussed with patient that Delton See has been held due to pending dental evaluation and her decision.  Prescription to have discussions with dentist and proceed with the options that she was offered.  5. RTC in 4 weeks for MD assessment, labs (CBC with diff, CMP, CA27.29), Faslodex, and +/- Xgeva.  Dr. Susy Manor is moving to [facility].  Patient decides to follow-up with Dr. Mike Gip in 4 weeks and see if it will work out for her.  We spent sufficient time to discuss many aspect of care, questions were answered to patient's satisfaction. Total face to face encounter time for this patient visit was 25 min. >50% of the time was  spent in counseling and coordination of care.   Earlie Server, MD, PhD Hematology Oncology [facility] at [facility] Pager- [phone] 12/07/2018

## 2018-12-08 LAB — CANCER ANTIGEN 27.29: CA 27.29: 27.7 U/mL (ref 0.0–38.6)

## 2018-12-16 ENCOUNTER — Ambulatory Visit (INDEPENDENT_AMBULATORY_CARE_PROVIDER_SITE_OTHER): Payer: Medicare Other | Admitting: Internal Medicine

## 2018-12-16 ENCOUNTER — Encounter: Payer: Self-pay | Admitting: Internal Medicine

## 2018-12-16 VITALS — BP 118/70 | HR 63 | Ht 64.0 in | Wt 199.0 lb

## 2018-12-16 DIAGNOSIS — C7951 Secondary malignant neoplasm of bone: Secondary | ICD-10-CM

## 2018-12-16 DIAGNOSIS — I739 Peripheral vascular disease, unspecified: Secondary | ICD-10-CM

## 2018-12-16 DIAGNOSIS — K089 Disorder of teeth and supporting structures, unspecified: Secondary | ICD-10-CM

## 2018-12-16 DIAGNOSIS — I10 Essential (primary) hypertension: Secondary | ICD-10-CM

## 2018-12-16 DIAGNOSIS — M170 Bilateral primary osteoarthritis of knee: Secondary | ICD-10-CM

## 2018-12-16 DIAGNOSIS — I2510 Atherosclerotic heart disease of native coronary artery without angina pectoris: Secondary | ICD-10-CM

## 2018-12-16 NOTE — Progress Notes (Signed)
Date:  12/16/2018   Name:  Kelli Williams   DOB:  1926-10-04   MRN:  [id number]   Chief Complaint: Hypertension (Follow up. )  Hypertension  This is a chronic problem. The problem is controlled. Associated symptoms include palpitations. Pertinent negatives include no chest pain, headaches or shortness of breath. Risk factors for coronary artery disease include dyslipidemia. Past treatments include ACE inhibitors and diuretics. The current treatment provides significant improvement.  Hyperlipidemia  The problem is controlled. Recent lipid tests were reviewed and are normal. Associated symptoms include myalgias. Pertinent negatives include no chest pain or shortness of breath. Current antihyperlipidemic treatment includes diet change and ezetimibe. The current treatment provides moderate improvement of lipids.  Dental issue - she has not been getting Xgeva for bone mets due to a bad tooth.  It needs to be reconstructed or pulled and she has not decided yet what to do.  She has a filling but there is concern over residual decay and possible infection. Knee instability - left knee feels unstable when walking.  She has severe DJD but can not get injections or TKR. She has seen Dr. Rudene Christians who recommended a knee brace, however it is bulky and won't stay up when she is walking.  Metastatic breast cancer - she completed XRT to hip and back but still has some mild right flank stiffness.  She is on Faslodex but holding Xgeva as above.  She is trying to let her hair grow out so she can donate to [facility].  Lab Results  Component Value Date   CHOL 140 12/07/2018   HDL 50 12/07/2018   LDLCALC 71 12/07/2018   TRIG 93 12/07/2018   CHOLHDL 2.8 12/07/2018   Lab Results  Component Value Date   CREATININE 0.91 12/07/2018   BUN 25 (H) 12/07/2018   NA 139 12/07/2018   K 3.8 12/07/2018   CL 99 12/07/2018   CO2 32 12/07/2018      Review of Systems  Constitutional: Negative for chills, fatigue and  fever.  HENT: Negative for trouble swallowing.   Eyes: Negative for visual disturbance.  Respiratory: Negative for cough, chest tightness, shortness of breath and wheezing.   Cardiovascular: Positive for palpitations. Negative for chest pain and leg swelling.  Gastrointestinal: Negative for constipation, diarrhea, nausea and vomiting.  Endocrine: Negative for polydipsia and polyuria.  Musculoskeletal: Positive for arthralgias, gait problem and myalgias.  Skin: Negative for color change and rash.  Neurological: Negative for dizziness, light-headedness and headaches.  Hematological: Negative for adenopathy.    Patient Active Problem List   Diagnosis Date Noted  . Dental cavity 08/10/2018  . PAD (peripheral artery disease) ([place]) 07/01/2018  . Aortic atherosclerosis ([place]) 07/01/2018  . Coronary artery calcification seen on CT scan 07/01/2018  . Cancer associated pain 02/05/2018  . Goals of care, counseling/discussion 11/24/2017  . Candida infection 11/24/2017  . Esophageal reflux 10/28/2017  . Postoperative seroma of subcutaneous tissue after non-dermatologic procedure 10/22/2017  . Bone metastases ([place]) 09/23/2017  . Osteopenia 09/08/2017  . Breast cancer of upper-outer quadrant of left female breast ([place]) 07/10/2017  . Irritable bowel syndrome without diarrhea 06/27/2017  . Oral lesion 06/27/2017  . BMI 33.0-33.9,adult 06/27/2017  . OA (osteoarthritis) of knee 10/16/2016  . Leg swelling 10/11/2015  . Neuropathy involving both lower extremities 06/19/2015  . Secondary localized osteoarthrosis, ankle and foot 06/01/2015  . Acne erythematosa 06/01/2015  . Avitaminosis D 06/01/2015  . Atrial tachycardia ([place]) 08/24/2014  . Essential  hypertension 08/17/2012  . History of breast cancer 08/17/2012    Allergies  Allergen Reactions  . Amitriptyline Hives  . Amoxicillin     Diarrhea    . Ivp Dye [Iodinated Diagnostic Agents]   . Tape Other (See Comments)    Whelps - please use  paper tape.  . Sulfa Antibiotics Rash  . Sulfa Antibiotics Rash    Past Surgical History:  Procedure Laterality Date  . ABDOMINAL HYSTERECTOMY    . AXILLARY LYMPH NODE BIOPSY Left 07/10/2017   INVASIVE MAMMARY CARCINOMA WITH FOCAL LOBULAR FEATURES.   Marland Kitchen BREAST BIOPSY Bilateral 1960   benign  . BREAST LUMPECTOMY Left 2000   breast ca with rad tx  . BREAST LUMPECTOMY Left 08/20/2017  . BREAST LUMPECTOMY Left 08/20/2017   Procedure: left breast wide excision;  Surgeon: Robert Bellow, MD;  Location: [facility] ORS;  Service: General;  Laterality: Left;  . CATARACT EXTRACTION     left eye  . CHOLECYSTECTOMY    . JOINT REPLACEMENT    . NASAL SEPTUM SURGERY    . PARTIAL KNEE ARTHROPLASTY     right  . TOTAL VAGINAL HYSTERECTOMY      Social History   Tobacco Use  . Smoking status: Never Smoker  . Smokeless tobacco: Never Used  . Tobacco comment: smoking cessation materials not required  Substance Use Topics  . Alcohol use: No    Alcohol/week: 0.0 standard drinks  . Drug use: No     Medication list has been reviewed and updated.  Current Meds  Medication Sig  . acetaminophen (TYLENOL) 325 MG tablet Take 650 mg by mouth every 6 (six) hours as needed.  Marland Kitchen aspirin 81 MG tablet Take 1 tablet by mouth daily.  . Calcium-Magnesium 500-250 MG TABS Take 2 tablets by mouth daily.   . clindamycin (CLEOCIN) 300 MG capsule Take 300 mg by mouth once. Prior to dental appointment  . Coenzyme Q10 (CO Q-10) 100 MG CAPS Take by mouth daily.  . diphenoxylate-atropine (LOMOTIL) 2.5-0.025 MG tablet Take 1 tablet by mouth 4 (four) times daily as needed for diarrhea or loose stools.  . ezetimibe (ZETIA) 10 MG tablet Take 1 tablet (10 mg total) by mouth daily.  Marland Kitchen gabapentin (NEURONTIN) 300 MG capsule Take 3 capsules (900 mg total) by mouth 2 (two) times daily.  Marland Kitchen lisinopril-hydrochlorothiazide (PRINZIDE,ZESTORETIC) 20-25 MG tablet Take 1.5 tablets by mouth daily.  . meloxicam (MOBIC) 15 MG tablet Take  1 tablet (15 mg total) by mouth daily.  . Multiple Vitamins-Minerals (MULTIVITAMIN WITH MINERALS) tablet Take 1 tablet by mouth daily.  Marland Kitchen nystatin (MYCOSTATIN/NYSTOP) powder Apply topically 4 (four) times daily.  Marland Kitchen omeprazole (PRILOSEC) 20 MG capsule TAKE 1 CAPSULE BY MOUTH  DAILY  . propranolol (INDERAL) 10 MG tablet Take 1 tablet (10 mg total) by mouth 3 (three) times daily as needed.  . traMADol (ULTRAM) 50 MG tablet TAKE 1/2 TO 1 TABLET BY  MOUTH EVERY 8 HOURS AS  NEEDED FOR PAIN    PHQ 2/9 Scores 12/16/2018 06/29/2018 06/27/2017 10/16/2016  PHQ - 2 Score 0 0 2 0  PHQ- 9 Score - 0 2 -    Physical Exam Vitals signs and nursing note reviewed.  Constitutional:      General: She is not in acute distress.    Appearance: She is well-developed.  HENT:     Head: Normocephalic and atraumatic.     Mouth/Throat:     Mouth: Mucous membranes are moist.  Neck:  Musculoskeletal: Normal range of motion and neck supple.  Cardiovascular:     Rate and Rhythm: Normal rate and regular rhythm.     Pulses:          Popliteal pulses are 1+ on the right side and 1+ on the left side.       Posterior tibial pulses are 1+ on the right side and 1+ on the left side.  Pulmonary:     Effort: Pulmonary effort is normal. No respiratory distress.     Breath sounds: Normal breath sounds.  Musculoskeletal:     Left knee: She exhibits decreased range of motion and swelling (posterior knee). She exhibits no effusion. No tenderness found.  Skin:    General: Skin is warm and dry.     Findings: No rash.  Neurological:     Mental Status: She is alert and oriented to person, place, and time.  Psychiatric:        Behavior: Behavior normal.        Thought Content: Thought content normal.    Wt Readings from Last 3 Encounters:  12/16/18 199 lb (90.3 kg)  12/07/18 197 lb 12.8 oz (89.7 kg)  11/09/18 199 lb (90.3 kg)    BP 118/70 (BP Location: Right Arm, Patient Position: Sitting, Cuff Size: Large)   Pulse 63    Ht 5\' 4"  (1.626 m)   Wt 199 lb (90.3 kg)   SpO2 97%   BMI 34.16 kg/m   Assessment and Plan: 1. Essential hypertension Controlled on current medication   2. PAD (peripheral artery disease) (HCC) stable  3. Bone metastases ([place]) Holding Xgeva due to dental issues Continue Faslodex  4. Primary osteoarthritis of both knees Recommend soft support to left knee  5. Coronary artery calcification seen on CT scan Continue Zetia Lipids controlled  6. Dental disorder Suggest a second endodontist opinion to get the tooth addressed in the near future   Partially dictated using Dragon software. Any errors are unintentional.  Halina Maidens, MD [facility]  12/16/2018

## 2019-01-03 NOTE — Progress Notes (Signed)
[facility] day:  01/04/2019   Chief Complaint: Kelli Williams is a 85 y.o. female with metastatic breast cancer who is seen for 1 month assessment on Faslodex.  HPI:  The patient was last seen in the medical oncology clinic by me on 11/09/2018.  At that time, she was doing well.  She denied any breast concerns.  Exam was stable.  CA27.29 was 30.9.  PET scan revealed no evidence of active disease.  She was seen by Dr. Tasia Catchings on 12/07/2018.  Notes reviewed.  CA27.29 was 27.7.  She received Faslodex.  During the interim, she has felt "alright".   She denies any breast concerns.  She states her back still hurts in one spot.  Left knee is "bone-on-bone". She denies any change in hip pain.  Her balance is "terrible".    Past Medical History:  Diagnosis Date  . Anxiety   . Arthritis    BOTH FEET AND KNEES  . Breast cancer ([place]) 2000   left breast ca with lumpectomy and rad tx 5 yr tamoxifen/Dr Jeannine Kitten at [place]  . Complication of anesthesia    WOKE UP DURING SURGERY FOR DEVIATED SEPTUM, KNEE REPLACEMENT AND BREAST LUMPECTOMY  . Dysrhythmia   . GERD (gastroesophageal reflux disease)   . Hypertension   . Neuropathic pain of both legs   . Neuropathy   . Pneumonia 2016  . Skin cancer of forehead   . Skin cancer of nose     Past Surgical History:  Procedure Laterality Date  . ABDOMINAL HYSTERECTOMY    . AXILLARY LYMPH NODE BIOPSY Left 07/10/2017   INVASIVE MAMMARY CARCINOMA WITH FOCAL LOBULAR FEATURES.   Marland Kitchen BREAST BIOPSY Bilateral 1960   benign  . BREAST LUMPECTOMY Left 2000   breast ca with rad tx  . BREAST LUMPECTOMY Left 08/20/2017  . BREAST LUMPECTOMY Left 08/20/2017   Procedure: left breast wide excision;  Surgeon: Robert Bellow, MD;  Location: [facility] ORS;  Service: General;  Laterality: Left;  . CATARACT EXTRACTION     left eye  . CHOLECYSTECTOMY    . JOINT REPLACEMENT    . NASAL SEPTUM SURGERY    . PARTIAL KNEE ARTHROPLASTY     right  . TOTAL VAGINAL HYSTERECTOMY      Family History  Problem Relation Age of Onset  . Heart disease Mother   . Heart attack Paternal Uncle   . Breast cancer Daughter 80       genetic negative  . Cancer Paternal Grandmother     Social History:  reports that she has never smoked. She has never used smokeless tobacco. She reports that she does not drink alcohol or use drugs.  The patient's husband died 2 years ago.  She was born outside of [place].  She previously worked as an Web designer.  She lives at the [facility] at [place].  She is independent except for the use of her walker. Patient has a daughter, Anae Hams.  The patient is alone today.  Allergies:  Allergies  Allergen Reactions  . Amitriptyline Hives  . Amoxicillin     Diarrhea    . Ivp Dye [Iodinated Diagnostic Agents]   . Tape Other (See Comments)    Whelps - please use paper tape.  . Sulfa Antibiotics Rash  . Sulfa Antibiotics Rash    Current Medications: Current Outpatient Medications  Medication Sig Dispense Refill  . aspirin 81 MG tablet Take 1 tablet by mouth daily.    Marland Kitchen  Calcium-Magnesium 500-250 MG TABS Take 2 tablets by mouth daily.     . Coenzyme Q10 (CO Q-10) 100 MG CAPS Take by mouth daily.    Marland Kitchen ezetimibe (ZETIA) 10 MG tablet Take 1 tablet (10 mg total) by mouth daily. 90 tablet 3  . gabapentin (NEURONTIN) 300 MG capsule Take 3 capsules (900 mg total) by mouth 2 (two) times daily. 540 capsule 1  . lisinopril-hydrochlorothiazide (PRINZIDE,ZESTORETIC) 20-25 MG tablet Take 1.5 tablets by mouth daily. 135 tablet 3  . meloxicam (MOBIC) 15 MG tablet Take 1 tablet (15 mg total) by mouth daily. 90 tablet 1  . Multiple Vitamins-Minerals (MULTIVITAMIN WITH MINERALS) tablet Take 1 tablet by mouth daily.    Marland Kitchen nystatin (MYCOSTATIN/NYSTOP) powder Apply topically 4 (four) times daily. 15 g 3  . omeprazole (PRILOSEC) 20 MG capsule TAKE 1 CAPSULE BY MOUTH  DAILY 90 capsule 1  . propranolol (INDERAL)  10 MG tablet Take 1 tablet (10 mg total) by mouth 3 (three) times daily as needed. 90 tablet 3  . traMADol (ULTRAM) 50 MG tablet TAKE 1/2 TO 1 TABLET BY  MOUTH EVERY 8 HOURS AS  NEEDED FOR PAIN 90 tablet 0  . acetaminophen (TYLENOL) 325 MG tablet Take 650 mg by mouth every 6 (six) hours as needed.    . clindamycin (CLEOCIN) 300 MG capsule Take 300 mg by mouth once. Prior to dental appointment    . diphenoxylate-atropine (LOMOTIL) 2.5-0.025 MG tablet Take 1 tablet by mouth 4 (four) times daily as needed for diarrhea or loose stools. (Patient not taking: Reported on 01/04/2019) 30 tablet 0   No current facility-administered medications for this visit.     Review of Systems  Constitutional: Negative for chills, diaphoresis, fever, malaise/fatigue and weight loss ( up 2 pounds).       Feels "alright".  HENT: Negative for congestion, ear pain, nosebleeds, sinus pain and sore throat.        Dental issues; has been seen by multiple providers.  Eyes: Negative.  Negative for double vision, photophobia and pain.  Respiratory: Negative.  Negative for cough, hemoptysis, sputum production and shortness of breath.   Cardiovascular: Positive for leg swelling. Negative for chest pain, palpitations, orthopnea and PND.  Gastrointestinal: Negative.  Negative for abdominal pain, blood in stool, constipation, diarrhea, melena, nausea and vomiting.  Genitourinary: Negative.  Negative for dysuria, frequency, hematuria and urgency.  Musculoskeletal: Positive for back pain (hurts in 1 spot) and joint pain (LEFT knee "bone on bone" and chronic hip pain). Negative for falls, myalgias and neck pain.  Skin: Negative.  Negative for itching and rash.  Neurological: Positive for sensory change (neuropathy on gabapentin). Negative for tremors, speech change, focal weakness, weakness and headaches.       Balance "terrible" - chronic.  Endo/Heme/Allergies: Negative.  Does not bruise/bleed easily.  Psychiatric/Behavioral:  Negative.  Negative for depression and memory loss. The patient is not nervous/anxious and does not have insomnia.   All other systems reviewed and are negative.  Performance status (ECOG):  2  Vital Signs BP 120/79 (BP Location: Right Arm, Patient Position: Sitting)   Pulse (!) 53   Resp 18   Ht '5\' 4"'$  (1.626 m)   Wt 199 lb 4.7 oz (90.4 kg)   SpO2 100%   BMI 34.21 kg/m   Physical Exam  Constitutional: She is oriented to person, place, and time. No distress.  Elderly woman sitting in the exam room in no acute distress.  She requires assistance onto the exam  table.  She has a rolling walker at her side.  HENT:  Head: Normocephalic and atraumatic.  Mouth/Throat: Oropharynx is clear and moist and mucous membranes are normal. No oropharyngeal exudate.  Shoulder length gray hair.  Eyes: Pupils are equal, round, and reactive to light. Conjunctivae and EOM are normal. No scleral icterus.  Neck: Normal range of motion. Neck supple. No JVD present.  Cardiovascular: Normal rate, regular rhythm, normal heart sounds and intact distal pulses. Exam reveals no gallop and no friction rub.  No murmur heard. Pulmonary/Chest: Effort normal and breath sounds normal. No respiratory distress. She has no wheezes. She has no rales. Left breast exhibits tenderness. Breast asymmetry: left breast post operative changes.  Abdominal: Soft. Bowel sounds are normal. She exhibits no distension and no mass. There is no abdominal tenderness. There is no rebound.  Musculoskeletal: Normal range of motion.        General: Edema (1-2+ bilateral lower extremity edema) present.  Lymphadenopathy:    She has no cervical adenopathy.    She has no axillary adenopathy.       Right: No supraclavicular adenopathy present.       Left: No supraclavicular adenopathy present.  Neurological: She is alert and oriented to person, place, and time.  Skin: Skin is warm and dry. No rash noted. She is not diaphoretic. No erythema. No  pallor.  Psychiatric: Mood, affect and judgment normal.  Nursing note and vitals reviewed.   Results for orders placed or performed in visit on 01/04/19 (from the past 336 hour(s))  Comprehensive metabolic panel   Collection Time: 01/04/19  2:41 PM  Result Value Ref Range   Sodium 136 135 - 145 mmol/L   Potassium 3.8 3.5 - 5.1 mmol/L   Chloride 97 (L) 98 - 111 mmol/L   CO2 29 22 - 32 mmol/L   Glucose, Bld 102 (H) 70 - 99 mg/dL   BUN 23 8 - 23 mg/dL   Creatinine, Ser 0.81 0.44 - 1.00 mg/dL   Calcium 9.1 8.9 - 10.3 mg/dL   Total Protein 7.6 6.5 - 8.1 g/dL   Albumin 4.1 3.5 - 5.0 g/dL   AST 21 15 - 41 U/L   ALT 12 0 - 44 U/L   Alkaline Phosphatase 69 38 - 126 U/L   Total Bilirubin 0.4 0.3 - 1.2 mg/dL   GFR calc non Af Amer >60 >60 mL/min   GFR calc Af Amer >60 >60 mL/min   Anion gap 10 5 - 15  CBC with Differential/Platelet   Collection Time: 01/04/19  2:41 PM  Result Value Ref Range   WBC 6.7 4.0 - 10.5 K/uL   RBC 4.01 3.87 - 5.11 MIL/uL   Hemoglobin 12.1 12.0 - 15.0 g/dL   HCT 36.1 36.0 - 46.0 %   MCV 90.0 80.0 - 100.0 fL   MCH 30.2 26.0 - 34.0 pg   MCHC 33.5 30.0 - 36.0 g/dL   RDW 14.3 11.5 - 15.5 %   Platelets 228 150 - 400 K/uL   nRBC 0.0 0.0 - 0.2 %   Neutrophils Relative % 70 %   Neutro Abs 4.7 1.7 - 7.7 K/uL   Lymphocytes Relative 18 %   Lymphs Abs 1.2 0.7 - 4.0 K/uL   Monocytes Relative 6 %   Monocytes Absolute 0.4 0.1 - 1.0 K/uL   Eosinophils Relative 4 %   Eosinophils Absolute 0.3 0.0 - 0.5 K/uL   Basophils Relative 1 %   Basophils Absolute 0.1 0.0 - 0.1  K/uL   Immature Granulocytes 1 %   Abs Immature Granulocytes 0.05 0.00 - 0.07 K/uL      Imaging studies: 09/16/2017:  PET scan revealed widespread scattered hypermetabolic lytic osseous metastases throughout the axial and proximal appendicular skeleton.  Bilateral proximal femoral skeletal metastases placed the patient at risk for pathologic hip fractures.  There was mild hypermetabolic right hilar  lymphadenopathy.   09/23/2017:  Plain films of the bilateral femurs revealed no pathologic fracture. The LEFT view showed lytic subtrochanteric metastasis that was previously seen on PET scan.  06/01/2018:  PET scan demonstrated marked improvement in the osseous metastatic disease.  Previously demonstrated areas of abnormally high metabolic activity have resolved, with the exception of a very faint area of residual activity (SUV 2.6; previously 11.5) in the LEFT subtrochanteric region. Previous LEFT axillary fluid collection now shows a 1.8 x 1.2 cm scarlike density with low-grade activity (SUV 2.7). Incidental findings include a small hiatal hernia and diverticulosis of the sigmoid colon.  02/11/2018:  Lumbar spine MRI revealed multifocal lumbosacral vertebral body signal abnormalities consistent with osseous metastatic disease. The largest lesion was at L1.  There was no pathologic compression fracture. There was multi-level moderate to severe neural foraminal stenosis, which had progressed since the prior study. 09/26/2018:  Sacral MRI revealed scattered small marrow lesions c/w metastatic disease. The 0.6 cm lesion in the S1 vertebral body was new since the comparison lumbar spine MRI. Small lesion in the left ilium adjacent to the SI joint was present on the prior MRI. The remaining lesions were not included on the prior MRI.  There was no fracture or other acute abnormality.  There was lower lumbar spondylosis and diverticulosis. 10/15/2018:  PET scan revealed no evidence of active breast cancer metastasis on FDG PET scan.  There were stable lytic lesions in the spine without evidence of metabolic activity consistent treated metastasis.   Assessment:  DEBIE ASHLINE is a 85 y.o. female with metastatic breast cancer s/p left axillary biopsy on 07/10/2017.  Pathology revealed a 1.1 cm grade II invasive mammary carcinoma with focal lobular features.   Wide excision on 08/20/2017 revealed metastatic  mammary carcinoma involving 1 of 4 axillary lymph nodes with extracapsular extension.  The superior medial margin was involved.  Tumor was ER + (> 90%), PR + (> 90%), and Her2/neu 2+ (FISH -).   [facility] genetic testing on 09/26/2017 revealed no pathogenic sequence variants or deletions/duplications.  BRCA1/2 testing on 09/26/2017 was negative.  She has a history of left breast cancer s/p wide excision and sentinel lymph node biopsy on 07/18/1999 at [facility].  Pathology revealed a 0.8 x 0.6 x 0.4 cm grade II invasive ductal adenocarcinoma.  There was no lymphovascular invasion.  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  Margins were negative.  Tumor was ER + (50%) and PR + (90%).  Pathologic stage was T1bN0.  She received 6200 cGy from 08/21/1999 - 10/03/1999.  She completed 5 years of tamoxifen in 06/2004.    PET scan on 09/16/2017 revealed widespread scattered hypermetabolic lytic osseous metastases throughout the axial and proximal appendicular skeleton.  Bilateral proximal femoral skeletal metastases placed the patient at risk for pathologic hip fractures.  There was mild hypermetabolic right hilar lymphadenopathy.  Plain films of the bilateral femurs on 09/23/2017 revealed no pathologic fracture. The LEFT view showed lytic subtrochanteric metastasis that was previously seen on PET scan.   PET scan on 10/15/2018 revealed no evidence of active breast cancer metastasis on FDG  PET scan.  There were stable lytic lesions in the spine without evidence of metabolic activity consistent treated metastasis.  CA27.29 has been followed:  14.7 on 02/05/2017, 19.1 on 02/06/2012, 172.2 on 09/04/2017, 144.9 on 11/06/2017, 73.4 on 02/05/2018, 63.3 on 03/06/2018, 47.2 on 04/06/2018, 42.4 on 05/07/2018, 36.6 on 06/08/2018, 34.6 on 07/06/2018, 27.1 on 08/06/2018, 28.2 on 09/10/2018,  26.0 on 10/09/2018, 30.9 on 11/09/2018, 27.7 on 12/07/2018, 35.8 on 01/04/2019, and 24.8 on 02/01/2019.  She received a palliative  course of radiation (3000 cGy in 10 fractions) to the left hip from 10/14/2017 - 10/27/2017.  She received a course of palliative radiation the the lumbar spine and SI joints from 03/04/2018 - 03/27/2018.  She began Faslodex on 09/26/2017 (last 12/07/2018).  She began monthly Xgeva on 09/26/2017 (last 05/07/2018).  She has dental issues.  Bone density on 09/01/2017 revealed osteopenia with a T-score of -1.9 in the right femoral neck and left forearm. She is on calcium and vitamin D.  Symptomatically, she has chronic knee, hip and back pain.  Exam is stable.  CA27.29 is 24.8 (normal).  Plan: 1.   Labs today:  CBC with diff, CMP, CA27.29. 2.   Metastatic breast cancer Clinically she continue to do well. CA27.29 remains normal. Faslodex today. Follow-up imaging every 6 months (due 04/15/2019). 3.   Bone metastasis  PET scan revealed no active disease on 10/15/2018. Delton See remains on hold secondary to dental issues (needs dentist). Continue calcium and vitamin D. 4.   Dental issues Await dental clearance. 5.   RTC in 1 month for labs (CBC with diff, CMP) and Faslodex. 6.   RTC in 2 moths for MD assessment, labs (CBC with diff, CMP, CA27.29) and Faslodex.   Lequita Asal, MD 01/04/19, 3:36 PM

## 2019-01-04 ENCOUNTER — Ambulatory Visit: Payer: Medicare Other

## 2019-01-04 ENCOUNTER — Encounter: Payer: Self-pay | Admitting: Hematology and Oncology

## 2019-01-04 ENCOUNTER — Inpatient Hospital Stay: Payer: Medicare Other | Attending: Hematology and Oncology

## 2019-01-04 ENCOUNTER — Inpatient Hospital Stay: Payer: Medicare Other | Admitting: Hematology and Oncology

## 2019-01-04 VITALS — BP 120/79 | HR 53 | Resp 18 | Ht 64.0 in | Wt 199.3 lb

## 2019-01-04 DIAGNOSIS — Z7189 Other specified counseling: Secondary | ICD-10-CM

## 2019-01-04 DIAGNOSIS — C50412 Malignant neoplasm of upper-outer quadrant of left female breast: Secondary | ICD-10-CM | POA: Diagnosis not present

## 2019-01-04 DIAGNOSIS — C773 Secondary and unspecified malignant neoplasm of axilla and upper limb lymph nodes: Secondary | ICD-10-CM | POA: Insufficient documentation

## 2019-01-04 DIAGNOSIS — Z170 Estrogen receptor positive status [ER+]: Secondary | ICD-10-CM | POA: Insufficient documentation

## 2019-01-04 DIAGNOSIS — C7951 Secondary malignant neoplasm of bone: Secondary | ICD-10-CM

## 2019-01-04 DIAGNOSIS — Z5111 Encounter for antineoplastic chemotherapy: Secondary | ICD-10-CM | POA: Insufficient documentation

## 2019-01-04 LAB — CBC WITH DIFFERENTIAL/PLATELET
Abs Immature Granulocytes: 0.05 10*3/uL (ref 0.00–0.07)
Basophils Absolute: 0.1 10*3/uL (ref 0.0–0.1)
Basophils Relative: 1 %
Eosinophils Absolute: 0.3 10*3/uL (ref 0.0–0.5)
Eosinophils Relative: 4 %
HCT: 36.1 % (ref 36.0–46.0)
Hemoglobin: 12.1 g/dL (ref 12.0–15.0)
Immature Granulocytes: 1 %
Lymphocytes Relative: 18 %
Lymphs Abs: 1.2 10*3/uL (ref 0.7–4.0)
MCH: 30.2 pg (ref 26.0–34.0)
MCHC: 33.5 g/dL (ref 30.0–36.0)
MCV: 90 fL (ref 80.0–100.0)
Monocytes Absolute: 0.4 10*3/uL (ref 0.1–1.0)
Monocytes Relative: 6 %
Neutro Abs: 4.7 10*3/uL (ref 1.7–7.7)
Neutrophils Relative %: 70 %
Platelets: 228 10*3/uL (ref 150–400)
RBC: 4.01 MIL/uL (ref 3.87–5.11)
RDW: 14.3 % (ref 11.5–15.5)
WBC: 6.7 10*3/uL (ref 4.0–10.5)
nRBC: 0 % (ref 0.0–0.2)

## 2019-01-04 LAB — COMPREHENSIVE METABOLIC PANEL
ALT: 12 U/L (ref 0–44)
AST: 21 U/L (ref 15–41)
Albumin: 4.1 g/dL (ref 3.5–5.0)
Alkaline Phosphatase: 69 U/L (ref 38–126)
Anion gap: 10 (ref 5–15)
BUN: 23 mg/dL (ref 8–23)
CO2: 29 mmol/L (ref 22–32)
Calcium: 9.1 mg/dL (ref 8.9–10.3)
Chloride: 97 mmol/L — ABNORMAL LOW (ref 98–111)
Creatinine, Ser: 0.81 mg/dL (ref 0.44–1.00)
GFR calc Af Amer: >60 mL/min (ref >60)
GFR calc non Af Amer: >60 mL/min (ref >60)
Glucose, Bld: 102 mg/dL — ABNORMAL HIGH (ref 70–99)
Potassium: 3.8 mmol/L (ref 3.5–5.1)
Sodium: 136 mmol/L (ref 135–145)
Total Bilirubin: 0.4 mg/dL (ref 0.3–1.2)
Total Protein: 7.6 g/dL (ref 6.5–8.1)

## 2019-01-04 MED ORDER — FULVESTRANT 250 MG/5ML IM SOLN
500.0000 mg | Freq: Once | INTRAMUSCULAR | Status: AC
  Administered 2019-01-04: 500 mg via INTRAMUSCULAR

## 2019-01-04 NOTE — Patient Instructions (Signed)

## 2019-01-04 NOTE — Progress Notes (Signed)
No new changes noted today 

## 2019-01-05 LAB — CANCER ANTIGEN 27.29: CA 27.29: 35.8 U/mL (ref 0.0–38.6)

## 2019-01-13 IMAGING — CT NM PET TUM IMG RESTAG (PS) SKULL BASE T - THIGH
1 of 11 series · 1 of 25 positions shown · non-contrast
Comparison: 11/21/2009 chest CT.

CLINICAL DATA: Subsequent treatment strategy for left breast
cancer, initially diagnosed as stage I in 6111, with metastatic left
axillary mammary carcinoma diagnosed on July 2017
ultrasound-guided biopsy and confirmed on 08/20/2017 surgical
excision.

EXAM:
NUCLEAR MEDICINE PET SKULL BASE TO THIGH
TECHNIQUE: 12.3 mCi F-18 FDG was injected intravenously. Full-ring PET imaging
was performed from the skull base to thigh after the radiotracer. CT
data was obtained and used for attenuation correction and anatomic
localization.
FASTING BLOOD GLUCOSE:  Value: 92 mg/dl

[Series 3: ct wb 5.0 b30f · axial · 5.0mm · 0.98mm/px · 1 of 288 slices shown]
[im 288/288  brain]
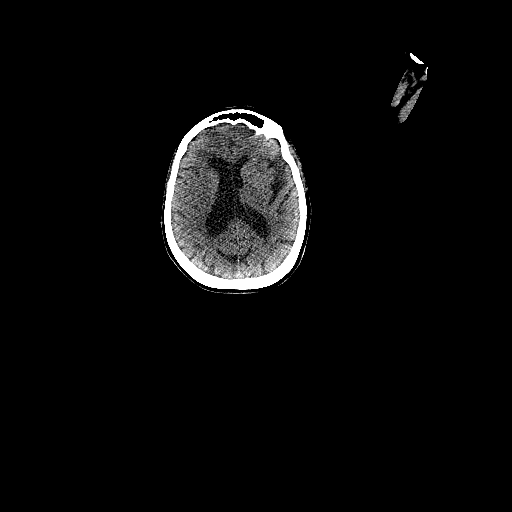

[1 of 25 positions shown; findings below may reference images not displayed]

FINDINGS: NECK: No hypermetabolic lymph nodes in the neck.

CHEST:

Mildly hypermetabolic right hilar lymph node with max SUV 3.8.

No additional hypermetabolic axillary, mediastinal or hilar lymph
nodes.

Postoperative seromas in the left axilla measuring up to 5.2 x
cm (series 3/image 82). Mild cardiomegaly. Atherosclerotic
nonaneurysmal thoracic aorta. Coronary atherosclerosis. No
pneumothorax. No pleural effusions. No acute consolidative airspace
disease, lung masses or significant pulmonary nodules.

ABDOMEN/PELVIS:

No abnormal hypermetabolic activity within the liver, pancreas,
adrenal glands, or spleen. No hypermetabolic lymph nodes in the
abdomen or pelvis.

Cholecystectomy. Atherosclerotic nonaneurysmal abdominal aorta.
Small hiatal hernia. Moderate diffuse colonic diverticulosis.
Hysterectomy.

SKELETON:

Widespread scattered hypermetabolic lytic osseous lesions throughout
the axial and proximal appendicular skeleton, with representative
lesions as follows:

- right T2 vertebral body 2.1 cm lytic lesion with max SUV
(series 3/image 60)

- posterior L1 vertebral body 3.4 cm lytic lesion with max SUV
(series 3/image 154)

- left upper sacral 1.4 cm lytic lesion with max SUV 6.7 (series
3/image 204)

- left proximal femoral metaphysis lytic 2.3 cm lesion with max SUV
11.5 (series 3/image 255)

- right femoral neck lytic 1.1 cm lesion with max SUV 5.1 (series
3/image 249)
IMPRESSION: 1. Widespread scattered hypermetabolic lytic osseous metastases
throughout the axial and proximal appendicular skeleton. Bilateral
proximal femoral skeletal metastases place the patient at risk for
pathologic hip fractures.
2. Mild hypermetabolic right hilar lymphadenopathy.
3. Postoperative seromas in the left axilla.
4. Chronic findings include Aortic Atherosclerosis (DOUCH-CB0.0),
coronary atherosclerosis, mild cardiomegaly, small hiatal hernia and
moderate colonic diverticulosis.

## 2019-01-25 ENCOUNTER — Other Ambulatory Visit: Payer: Self-pay | Admitting: Internal Medicine

## 2019-01-25 DIAGNOSIS — G5793 Unspecified mononeuropathy of bilateral lower limbs: Secondary | ICD-10-CM

## 2019-01-28 ENCOUNTER — Telehealth: Payer: Self-pay

## 2019-01-28 NOTE — Telephone Encounter (Signed)
-----   Message from Karen Kitchens, NP sent at 01/28/2019  2:58 PM EST ----- Regarding: FW: therapy orders Contact: [phone] This will need to come from PCP. Please make them aware.  BG ----- Message ----- From: Secundino Ginger Sent: 01/28/2019   2:39 PM EST To: Karen Kitchens, NP, Lequita Asal, MD Subject: therapy orders                                 Arrie Aran called from the [facility], he is the case mngr. He said he wanted therapy order s for this pt. He would like a call to see if she would be able to get therapy and if so he would need orders. Please call Arrie Aran.

## 2019-01-28 NOTE — Telephone Encounter (Signed)
-----   Message from Karen Kitchens, NP sent at 01/28/2019  3:34 PM EST ----- Regarding: RE: lots of Questions Contact: [phone] We do not manage her dental issues. There is always risk of infection, but that is something that she and her dental provider will have to decide. She can rope in her PCP as well for further advice.   BG ----- Message ----- From: Secundino Ginger Sent: 01/28/2019   3:09 PM EST To: Karen Kitchens, NP, Lequita Asal, MD Subject: lots of Questions                              This pt called and has questions about some dental issues. Root canal vs tooth pulled. The one dr said he would not pull the tooth because he is afraid of infection. Can someone called her and figure out what she needs to do. She said her only option now is a root canal so she wants to know if it is a good idea with her condition. She also had a fall but didn't get hurt.

## 2019-01-28 NOTE — Telephone Encounter (Signed)
poke with Ms Kelli Williams to inform her that with her dental issue she will need to got through her [facility] office to get a root canal or if they decide to pull the tooth that will be for her dentist to make that [place]. The patient was understanding and agreeable to get information from her current Dentist office.

## 2019-01-28 NOTE — Telephone Encounter (Signed)
Spoke with Jeneen Rinks at PT which he has requested orders for PT. I have informed him, per [facility] NP she will need to get the orders from her PCP. Mr Jeneen Rinks was understanding and agreeable to go through her PCP to get her orders for PT.

## 2019-01-29 ENCOUNTER — Telehealth: Payer: Self-pay

## 2019-01-29 NOTE — Telephone Encounter (Signed)
Spoke with [facility] Audie Pinto ). She took the verbal order but they do ask that we send a letter or fax Rx stating we approve PT for the patient for falls and knee collapse.   Attn: Audie Pinto Fax: [phone]

## 2019-01-29 NOTE — Telephone Encounter (Signed)
Okay for PT

## 2019-01-29 NOTE — Telephone Encounter (Signed)
Arrie Aran from [facility] manager) called saying pt is having troubles with Left knee collapse. She has had several falls.  Requesting orders for PT to come and help her with knee weakness for safety.  Please advise.  CB# [phone]

## 2019-02-01 ENCOUNTER — Inpatient Hospital Stay: Payer: Medicare Other | Attending: Hematology and Oncology

## 2019-02-01 ENCOUNTER — Inpatient Hospital Stay: Payer: Medicare Other

## 2019-02-01 DIAGNOSIS — C7951 Secondary malignant neoplasm of bone: Secondary | ICD-10-CM | POA: Diagnosis not present

## 2019-02-01 DIAGNOSIS — C50412 Malignant neoplasm of upper-outer quadrant of left female breast: Secondary | ICD-10-CM | POA: Diagnosis not present

## 2019-02-01 DIAGNOSIS — C773 Secondary and unspecified malignant neoplasm of axilla and upper limb lymph nodes: Secondary | ICD-10-CM | POA: Diagnosis not present

## 2019-02-01 DIAGNOSIS — M85851 Other specified disorders of bone density and structure, right thigh: Secondary | ICD-10-CM

## 2019-02-01 DIAGNOSIS — Z170 Estrogen receptor positive status [ER+]: Secondary | ICD-10-CM | POA: Insufficient documentation

## 2019-02-01 LAB — CBC WITH DIFFERENTIAL/PLATELET
Abs Immature Granulocytes: 0.03 10*3/uL (ref 0.00–0.07)
Basophils Absolute: 0.1 10*3/uL (ref 0.0–0.1)
Basophils Relative: 1 %
Eosinophils Absolute: 0.4 10*3/uL (ref 0.0–0.5)
Eosinophils Relative: 6 %
HCT: 35.9 % — ABNORMAL LOW (ref 36.0–46.0)
Hemoglobin: 12 g/dL (ref 12.0–15.0)
Immature Granulocytes: 0 %
Lymphocytes Relative: 18 %
Lymphs Abs: 1.3 10*3/uL (ref 0.7–4.0)
MCH: 29.6 pg (ref 26.0–34.0)
MCHC: 33.4 g/dL (ref 30.0–36.0)
MCV: 88.6 fL (ref 80.0–100.0)
Monocytes Absolute: 0.5 10*3/uL (ref 0.1–1.0)
Monocytes Relative: 7 %
Neutro Abs: 4.6 10*3/uL (ref 1.7–7.7)
Neutrophils Relative %: 68 %
Platelets: 210 10*3/uL (ref 150–400)
RBC: 4.05 MIL/uL (ref 3.87–5.11)
RDW: 13.9 % (ref 11.5–15.5)
WBC: 6.9 10*3/uL (ref 4.0–10.5)
nRBC: 0 % (ref 0.0–0.2)

## 2019-02-01 LAB — COMPREHENSIVE METABOLIC PANEL
ALT: 15 U/L (ref 0–44)
AST: 23 U/L (ref 15–41)
Albumin: 3.9 g/dL (ref 3.5–5.0)
Alkaline Phosphatase: 64 U/L (ref 38–126)
Anion gap: 8 (ref 5–15)
BUN: 21 mg/dL (ref 8–23)
CO2: 27 mmol/L (ref 22–32)
Calcium: 9 mg/dL (ref 8.9–10.3)
Chloride: 101 mmol/L (ref 98–111)
Creatinine, Ser: 0.79 mg/dL (ref 0.44–1.00)
GFR calc Af Amer: >60 mL/min (ref >60)
GFR calc non Af Amer: >60 mL/min (ref >60)
Glucose, Bld: 102 mg/dL — ABNORMAL HIGH (ref 70–99)
Potassium: 3.7 mmol/L (ref 3.5–5.1)
Sodium: 136 mmol/L (ref 135–145)
Total Bilirubin: 0.5 mg/dL (ref 0.3–1.2)
Total Protein: 7.3 g/dL (ref 6.5–8.1)

## 2019-02-01 MED ORDER — FULVESTRANT 250 MG/5ML IM SOLN
500.0000 mg | Freq: Once | INTRAMUSCULAR | Status: AC
  Administered 2019-02-01: 500 mg via INTRAMUSCULAR

## 2019-02-02 LAB — CANCER ANTIGEN 27.29: CA 27.29: 24.8 U/mL (ref 0.0–38.6)

## 2019-02-25 ENCOUNTER — Encounter: Payer: Self-pay | Admitting: Hematology and Oncology

## 2019-02-26 ENCOUNTER — Other Ambulatory Visit: Payer: Self-pay

## 2019-02-28 NOTE — Progress Notes (Signed)
[facility] [address] [place], [place] [address] Phone: [phone]  Fax: [phone]   Clinic day:  03/01/2019   Chief Complaint: Kelli Williams is a 85 y.o. female with metastatic breast cancer who is seen for 2 month assessment and continuation of monthly Faslodex.  HPI:  The patient was last seen in the medical oncology clinic by me on 01/04/2019.  At that time, she had chronic knee, hip and back pain.  Exam was stable.  CA27.29 was 24.8 (normal).  She received Faslodex.  She was still struggling regarding her dental issues.  Delton See was on hold.  She received Faslodex on 02/01/2019.  During the interim, patient continues to have chronic pain in her knees, hips, and back. She has been seen in the interim by several dentists, with the last being an endodontist who advised that root canal may not be beneficial. Oral surgeon has advised patient that extraction could result in a life-threatening infection.   Patient denies that she has experienced any B symptoms. She denies any interval infections.  Patient does not verbalize any concerns with regards to her breasts today. Patient complains of pain in her LEFT axilla under her original surgical scar. Patient performs monthly self breast examinations as recommended.   Patient advises that she maintains an adequate appetite. She is eating well. Weight today is 198 lb 11.9 oz (90.2 kg), which compared to her last visit to the clinic, represents a 1 pound decrease.   Patient denies pain in the clinic today.   Past Medical History:  Diagnosis Date   Anxiety    Arthritis    BOTH FEET AND KNEES   Breast cancer ([place]) 2000   left breast ca with lumpectomy and rad tx 5 yr tamoxifen/Dr Jeannine Kitten at [facility]   Complication of anesthesia    WOKE UP DURING SURGERY FOR DEVIATED SEPTUM, KNEE REPLACEMENT AND BREAST LUMPECTOMY   Dysrhythmia    GERD (gastroesophageal reflux disease)    Hypertension     Neuropathic pain of both legs    Neuropathy    Pneumonia 2016   Skin cancer of forehead    Skin cancer of nose     Past Surgical History:  Procedure Laterality Date   ABDOMINAL HYSTERECTOMY     AXILLARY LYMPH NODE BIOPSY Left 07/10/2017   INVASIVE MAMMARY CARCINOMA WITH FOCAL LOBULAR FEATURES.    BREAST BIOPSY Bilateral 1960   benign   BREAST LUMPECTOMY Left 2000   breast ca with rad tx   BREAST LUMPECTOMY Left 08/20/2017   BREAST LUMPECTOMY Left 08/20/2017   Procedure: left breast wide excision;  Surgeon: Robert Bellow, MD;  Location: [facility] ORS;  Service: General;  Laterality: Left;   CATARACT EXTRACTION     left eye   CHOLECYSTECTOMY     JOINT REPLACEMENT     NASAL SEPTUM SURGERY     PARTIAL KNEE ARTHROPLASTY     right   TOTAL VAGINAL HYSTERECTOMY      Family History  Problem Relation Age of Onset   Heart disease Mother    Heart attack Paternal Uncle    Breast cancer Daughter 77       genetic negative   Cancer Paternal Grandmother     Social History:  reports that she has never smoked. She has never used smokeless tobacco. She reports that she does not drink alcohol or use drugs.  The patient's husband died 2 years ago.  She was born outside of [place].  She  previously worked as an Web designer.  She lives at the [facility].  She is independent except for the use of her walker. Patient has a daughter, Kelli Williams.  The patient is alone today.  Allergies:  Allergies  Allergen Reactions   Amitriptyline Hives   Amoxicillin     Diarrhea     Ivp Dye [Iodinated Diagnostic Agents]    Tape Other (See Comments)    Whelps - please use paper tape.   Sulfa Antibiotics Rash   Sulfa Antibiotics Rash    Current Medications: Current Outpatient Medications  Medication Sig Dispense Refill   aspirin 81 MG tablet Take 1 tablet by mouth daily.     Calcium-Magnesium 500-250 MG TABS Take 2 tablets by mouth daily.       chlorhexidine gluconate, MEDLINE KIT, (PERIDEX) 0.12 % solution      clindamycin (CLEOCIN) 300 MG capsule Take 300 mg by mouth once. Prior to dental appointment     Coenzyme Q10 (CO Q-10) 100 MG CAPS Take by mouth daily.     ezetimibe (ZETIA) 10 MG tablet Take 1 tablet (10 mg total) by mouth daily. 90 tablet 3   gabapentin (NEURONTIN) 300 MG capsule Take 3 capsules (900 mg total) by mouth 2 (two) times daily. 540 capsule 1   lisinopril-hydrochlorothiazide (PRINZIDE,ZESTORETIC) 20-25 MG tablet Take 1.5 tablets by mouth daily. 135 tablet 3   meloxicam (MOBIC) 15 MG tablet TAKE 1 TABLET BY MOUTH  DAILY 90 tablet 1   Multiple Vitamins-Minerals (MULTIVITAMIN WITH MINERALS) tablet Take 1 tablet by mouth daily.     omeprazole (PRILOSEC) 20 MG capsule TAKE 1 CAPSULE BY MOUTH  DAILY 90 capsule 1   propranolol (INDERAL) 10 MG tablet Take 1 tablet (10 mg total) by mouth 3 (three) times daily as needed. 90 tablet 3   traMADol (ULTRAM) 50 MG tablet TAKE 1/2 TO 1 TABLET BY  MOUTH EVERY 8 HOURS AS  NEEDED FOR PAIN 90 tablet 0   acetaminophen (TYLENOL) 325 MG tablet Take 650 mg by mouth every 6 (six) hours as needed.     diphenoxylate-atropine (LOMOTIL) 2.5-0.025 MG tablet Take 1 tablet by mouth 4 (four) times daily as needed for diarrhea or loose stools. (Patient not taking: Reported on 01/04/2019) 30 tablet 0   nystatin (MYCOSTATIN/NYSTOP) powder Apply topically 4 (four) times daily. (Patient not taking: Reported on 03/01/2019) 15 g 3   No current facility-administered medications for this visit.     Review of Systems  Constitutional: Positive for weight loss (down 1 pound). Negative for chills, diaphoresis, fever and malaise/fatigue.       "I am doing alright".   HENT: Negative for congestion, ear pain, nosebleeds, sinus pain and sore throat.        Dental issues; has been seen by multiple providers.  Eyes: Negative.  Negative for double vision, photophobia and pain.  Respiratory: Negative.   Negative for cough, hemoptysis, sputum production and shortness of breath.   Cardiovascular: Positive for leg swelling (chronic). Negative for chest pain, palpitations, orthopnea and PND.  Gastrointestinal: Negative.  Negative for abdominal pain, blood in stool, constipation, diarrhea, melena, nausea and vomiting.  Genitourinary: Negative.  Negative for dysuria, frequency, hematuria and urgency.  Musculoskeletal: Positive for back pain (chronic) and joint pain (LEFT knee "bone on bone" and chronic BILATERAL hip pain). Negative for falls, myalgias and neck pain.  Skin: Negative.  Negative for itching and rash.  Neurological: Positive for sensory change (stable neuropathy; on gabapentin). Negative for tremors,  speech change, focal weakness, weakness and headaches.       Chronic balance issues  Endo/Heme/Allergies: Negative.  Does not bruise/bleed easily.  Psychiatric/Behavioral: Negative.  Negative for depression and memory loss. The patient is not nervous/anxious and does not have insomnia.   All other systems reviewed and are negative.  Performance status (ECOG): 2 - Symptomatic, <50% confined to bed  Vital Signs BP (!) 140/57 (BP Location: Right Arm, Patient Position: Sitting)    Pulse 66    Temp 98.4 F (36.9 C) (Tympanic)    Resp 18    Ht '5\' 4"'$  (1.626 m)    Wt 198 lb 11.9 oz (90.2 kg)    SpO2 99%    BMI 34.11 kg/m   Physical Exam  Constitutional: She is oriented to person, place, and time and well-developed, well-nourished, and in no distress. No distress.  She has a rolling walker by her side.  HENT:  Head: Normocephalic and atraumatic.  Mouth/Throat: Oropharynx is clear and moist and mucous membranes are normal. No oropharyngeal exudate.  Long gray hair.  Thin face.  Eyes: Pupils are equal, round, and reactive to light. Conjunctivae and EOM are normal. No scleral icterus.  Neck: Normal range of motion. Neck supple.  Cardiovascular: Normal rate, regular rhythm, normal heart sounds and  intact distal pulses. Exam reveals no gallop and no friction rub.  No murmur heard. Pulmonary/Chest: Effort normal and breath sounds normal. No respiratory distress. She has no wheezes. She has no rales. Left breast exhibits tenderness (left axillary tenderness associated with scar tissue).  Abdominal: Soft. Bowel sounds are normal. She exhibits no distension and no mass. There is no abdominal tenderness.  Musculoskeletal: Normal range of motion.        General: Edema (chronic bilateral lower extremity) present. No tenderness.  Lymphadenopathy:    She has no cervical adenopathy.    She has no axillary adenopathy.       Right: No inguinal and no supraclavicular adenopathy present.       Left: No inguinal and no supraclavicular adenopathy present.  Neurological: She is alert and oriented to person, place, and time.  Skin: Skin is warm and dry. No rash noted. She is not diaphoretic. No erythema. No pallor.  Psychiatric: Mood, affect and judgment normal.  Nursing note and vitals reviewed.   Results for orders placed or performed in visit on 03/01/19 (from the past 336 hour(s))  TSH   Collection Time: 03/01/19  1:56 PM  Result Value Ref Range   TSH 1.606 0.350 - 4.500 uIU/mL  T4, free   Collection Time: 03/01/19  1:56 PM  Result Value Ref Range   Free T4 1.10 0.82 - 1.77 ng/dL  Results for orders placed or performed in visit on 03/01/19 (from the past 336 hour(s))  CBC with Differential   Collection Time: 03/01/19  1:04 PM  Result Value Ref Range   WBC 6.2 4.0 - 10.5 K/uL   RBC 3.99 3.87 - 5.11 MIL/uL   Hemoglobin 12.1 12.0 - 15.0 g/dL   HCT 36.4 36.0 - 46.0 %   MCV 91.2 80.0 - 100.0 fL   MCH 30.3 26.0 - 34.0 pg   MCHC 33.2 30.0 - 36.0 g/dL   RDW 13.9 11.5 - 15.5 %   Platelets 216 150 - 400 K/uL   nRBC 0.0 0.0 - 0.2 %   Neutrophils Relative % 66 %   Neutro Abs 4.1 1.7 - 7.7 K/uL   Lymphocytes Relative 19 %   Lymphs  Abs 1.2 0.7 - 4.0 K/uL   Monocytes Relative 8 %   Monocytes  Absolute 0.5 0.1 - 1.0 K/uL   Eosinophils Relative 6 %   Eosinophils Absolute 0.3 0.0 - 0.5 K/uL   Basophils Relative 1 %   Basophils Absolute 0.1 0.0 - 0.1 K/uL   Immature Granulocytes 0 %   Abs Immature Granulocytes 0.02 0.00 - 0.07 K/uL  Comprehensive metabolic panel   Collection Time: 03/01/19  1:04 PM  Result Value Ref Range   Sodium 135 135 - 145 mmol/L   Potassium 4.1 3.5 - 5.1 mmol/L   Chloride 98 98 - 111 mmol/L   CO2 27 22 - 32 mmol/L   Glucose, Bld 109 (H) 70 - 99 mg/dL   BUN 27 (H) 8 - 23 mg/dL   Creatinine, Ser 0.90 0.44 - 1.00 mg/dL   Calcium 9.1 8.9 - 10.3 mg/dL   Total Protein 7.6 6.5 - 8.1 g/dL   Albumin 4.0 3.5 - 5.0 g/dL   AST 21 15 - 41 U/L   ALT 12 0 - 44 U/L   Alkaline Phosphatase 73 38 - 126 U/L   Total Bilirubin 0.4 0.3 - 1.2 mg/dL   GFR calc non Af Amer 56 (L) >60 mL/min   GFR calc Af Amer >60 >60 mL/min   Anion gap 10 5 - 15      Imaging studies: 09/16/2017:  PET scan revealed widespread scattered hypermetabolic lytic osseous metastases throughout the axial and proximal appendicular skeleton.  Bilateral proximal femoral skeletal metastases placed the patient at risk for pathologic hip fractures.  There was mild hypermetabolic right hilar lymphadenopathy.   09/23/2017:  Plain films of the bilateral femurs revealed no pathologic fracture. The LEFT view showed lytic subtrochanteric metastasis that was previously seen on PET scan.  06/01/2018:  PET scan demonstrated marked improvement in the osseous metastatic disease.  Previously demonstrated areas of abnormally high metabolic activity have resolved, with the exception of a very faint area of residual activity (SUV 2.6; previously 11.5) in the LEFT subtrochanteric region. Previous LEFT axillary fluid collection now shows a 1.8 x 1.2 cm scarlike density with low-grade activity (SUV 2.7). Incidental findings include a small hiatal hernia and diverticulosis of the sigmoid colon.  02/11/2018:  Lumbar spine MRI  revealed multifocal lumbosacral vertebral body signal abnormalities consistent with osseous metastatic disease. The largest lesion was at L1.  There was no pathologic compression fracture. There was multi-level moderate to severe neural foraminal stenosis, which had progressed since the prior study. 09/26/2018:  Sacral MRI revealed scattered small marrow lesions c/w metastatic disease. The 0.6 cm lesion in the S1 vertebral body was new since the comparison lumbar spine MRI. Small lesion in the left ilium adjacent to the SI joint was present on the prior MRI. The remaining lesions were not included on the prior MRI.  There was no fracture or other acute abnormality.  There was lower lumbar spondylosis and diverticulosis. 10/15/2018:  PET scan revealed no evidence of active breast cancer metastasis on FDG PET scan.  There were stable lytic lesions in the spine without evidence of metabolic activity consistent treated metastasis.   Assessment:  Kelli Williams is a 84 y.o. female with metastatic breast cancer s/p left axillary biopsy on 07/10/2017.  Pathology revealed a 1.1 cm grade II invasive mammary carcinoma with focal lobular features.   Wide excision on 08/20/2017 revealed metastatic mammary carcinoma involving 1 of 4 axillary lymph nodes with extracapsular extension.  The superior medial margin  was involved.  Tumor was ER + (> 90%), PR + (> 90%), and Her2/neu 2+ (FISH -).   [facility] genetic testing on 09/26/2017 revealed no pathogenic sequence variants or deletions/duplications.  BRCA1/2 testing on 09/26/2017 was negative.  She has a history of left breast cancer s/p wide excision and sentinel lymph node biopsy on 07/18/1999 at [facility].  Pathology revealed a 0.8 x 0.6 x 0.4 cm grade II invasive ductal adenocarcinoma.  There was no lymphovascular invasion.  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  Margins were negative.  Tumor was ER + (50%) and PR + (90%).  Pathologic stage was T1bN0.  She  received 6200 cGy from 08/21/1999 - 10/03/1999.  She completed 5 years of tamoxifen in 06/2004.    PET scan on 09/16/2017 revealed widespread scattered hypermetabolic lytic osseous metastases throughout the axial and proximal appendicular skeleton.  Bilateral proximal femoral skeletal metastases placed the patient at risk for pathologic hip fractures.  There was mild hypermetabolic right hilar lymphadenopathy.  Plain films of the bilateral femurs on 09/23/2017 revealed no pathologic fracture. The LEFT view showed lytic subtrochanteric metastasis that was previously seen on PET scan.   PET scan on 10/15/2018 revealed no evidence of active breast cancer metastasis on FDG PET scan.  There were stable lytic lesions in the spine without evidence of metabolic activity consistent treated metastasis.  CA27.29 has been followed:  14.7 on 02/05/2017, 19.1 on 02/06/2012, 172.2 on 09/04/2017, 144.9 on 11/06/2017, 73.4 on 02/05/2018, 63.3 on 03/06/2018, 47.2 on 04/06/2018, 42.4 on 05/07/2018, 36.6 on 06/08/2018, 34.6 on 07/06/2018, 27.1 on 08/06/2018, 28.2 on 09/10/2018,  26.0 on 10/09/2018, 30.9 on 11/09/2018, 27.7 on 12/07/2018, 35.8 on 01/04/2019, and 24.8 on 02/01/2019.  She received a palliative course of radiation (3000 cGy in 10 fractions) to the left hip from 10/14/2017 - 10/27/2017.  She received a course of palliative radiation the the lumbar spine and SI joints from 03/04/2018 - 03/27/2018.  She began Faslodex on 09/26/2017 (last 02/01/2019).  She began monthly Xgeva on 09/26/2017 (last 05/07/2018).  She has dental issues.  Bone density on 09/01/2017 revealed osteopenia with a T-score of -1.9 in the right femoral neck and left forearm. She is on calcium and vitamin D.  Symptomatically, she is doing well overall. She denies any acute concerns today. Continues to have dental issues despite seeing multiple providers. She notes that no one (dentist) can agree on a plan of care. She has chronic back and joint  pain. She complains of pain in her LEFT axilla beneath her surgical scar.  Exam is grossly unremarkable.  WBC 6200 ([place] 4100).  Plan: 1. Labs today:  CBC with diff, CMP, CA27.29, TSH, free T4. 2. Metastatic breast cancer  Clinically doing well.  No breast concerns.  Chronic LEFT axillary tenderness beneath her scar - normal finding.  Continues on monthly fulvestrant injections (last 02/01/2019) - due today.   Review plans for follow-up imaging every 6 months (due 04/15/2019). 3. Bone metastasis  Last PET scan revealed no active disease (10/15/2018).  Denosumab remains on hold secondary to chronic dental issues.  Continue supplemental calcium and vitamin D daily. 4. Dental issues  Has seen several dental providers to discuss root canal vs extraction.  No definitive plan of care at this time.  Awaiting dental clearance prior to resumption of denosumab therapy. 5. Elevated free T4  TSH was 2.045 (normal) and free T4 was 3.06 (0.82 - 1.77) on 10/09/2018.  Patient requests re-evaluation. 6. RTC in 1 month for  labs (CBC with diff, CMP) and fulvestrant injection. 7. RTC in 2 months for MD assessment, labs (CBC with diff, CMP, CA27.29), and fulvestrant injection.   Honor Loh, NP 03/01/19, 1:42 PM    I saw and evaluated the patient, participating in the key portions of the service and reviewing pertinent diagnostic studies and records.  I reviewed the nurse practitioner's note and agree with the findings and the plan.  The assessment and plan were discussed with the patient.  Multiple questions were asked by the patient and answered.   Nolon Stalls, MD 03/01/2019,1:42 PM

## 2019-03-01 ENCOUNTER — Ambulatory Visit: Payer: Medicare Other

## 2019-03-01 ENCOUNTER — Inpatient Hospital Stay (HOSPITAL_BASED_OUTPATIENT_CLINIC_OR_DEPARTMENT_OTHER): Payer: Medicare Other | Admitting: Hematology and Oncology

## 2019-03-01 ENCOUNTER — Inpatient Hospital Stay: Payer: Medicare Other

## 2019-03-01 ENCOUNTER — Other Ambulatory Visit: Payer: Medicare Other

## 2019-03-01 ENCOUNTER — Encounter: Payer: Self-pay | Admitting: Hematology and Oncology

## 2019-03-01 ENCOUNTER — Other Ambulatory Visit: Payer: Self-pay

## 2019-03-01 ENCOUNTER — Inpatient Hospital Stay: Payer: Medicare Other | Attending: Hematology and Oncology

## 2019-03-01 ENCOUNTER — Ambulatory Visit: Payer: Medicare Other | Admitting: Hematology and Oncology

## 2019-03-01 VITALS — BP 140/57 | HR 66 | Temp 98.4°F | Resp 18 | Ht 64.0 in | Wt 198.7 lb

## 2019-03-01 DIAGNOSIS — R7989 Other specified abnormal findings of blood chemistry: Secondary | ICD-10-CM

## 2019-03-01 DIAGNOSIS — M85832 Other specified disorders of bone density and structure, left forearm: Secondary | ICD-10-CM

## 2019-03-01 DIAGNOSIS — C50412 Malignant neoplasm of upper-outer quadrant of left female breast: Secondary | ICD-10-CM

## 2019-03-01 DIAGNOSIS — Z923 Personal history of irradiation: Secondary | ICD-10-CM | POA: Insufficient documentation

## 2019-03-01 DIAGNOSIS — C7951 Secondary malignant neoplasm of bone: Secondary | ICD-10-CM | POA: Insufficient documentation

## 2019-03-01 DIAGNOSIS — Z170 Estrogen receptor positive status [ER+]: Secondary | ICD-10-CM

## 2019-03-01 DIAGNOSIS — R946 Abnormal results of thyroid function studies: Secondary | ICD-10-CM | POA: Diagnosis not present

## 2019-03-01 DIAGNOSIS — M85851 Other specified disorders of bone density and structure, right thigh: Secondary | ICD-10-CM | POA: Insufficient documentation

## 2019-03-01 DIAGNOSIS — Z5111 Encounter for antineoplastic chemotherapy: Secondary | ICD-10-CM | POA: Insufficient documentation

## 2019-03-01 LAB — COMPREHENSIVE METABOLIC PANEL
ALT: 12 U/L (ref 0–44)
AST: 21 U/L (ref 15–41)
Albumin: 4 g/dL (ref 3.5–5.0)
Alkaline Phosphatase: 73 U/L (ref 38–126)
Anion gap: 10 (ref 5–15)
BUN: 27 mg/dL — ABNORMAL HIGH (ref 8–23)
CO2: 27 mmol/L (ref 22–32)
Calcium: 9.1 mg/dL (ref 8.9–10.3)
Chloride: 98 mmol/L (ref 98–111)
Creatinine, Ser: 0.9 mg/dL (ref 0.44–1.00)
GFR calc Af Amer: >60 mL/min (ref >60)
GFR calc non Af Amer: 56 mL/min — ABNORMAL LOW (ref >60)
Glucose, Bld: 109 mg/dL — ABNORMAL HIGH (ref 70–99)
Potassium: 4.1 mmol/L (ref 3.5–5.1)
Sodium: 135 mmol/L (ref 135–145)
Total Bilirubin: 0.4 mg/dL (ref 0.3–1.2)
Total Protein: 7.6 g/dL (ref 6.5–8.1)

## 2019-03-01 LAB — CBC WITH DIFFERENTIAL/PLATELET
Abs Immature Granulocytes: 0.02 10*3/uL (ref 0.00–0.07)
Basophils Absolute: 0.1 10*3/uL (ref 0.0–0.1)
Basophils Relative: 1 %
Eosinophils Absolute: 0.3 10*3/uL (ref 0.0–0.5)
Eosinophils Relative: 6 %
HCT: 36.4 % (ref 36.0–46.0)
Hemoglobin: 12.1 g/dL (ref 12.0–15.0)
Immature Granulocytes: 0 %
Lymphocytes Relative: 19 %
Lymphs Abs: 1.2 10*3/uL (ref 0.7–4.0)
MCH: 30.3 pg (ref 26.0–34.0)
MCHC: 33.2 g/dL (ref 30.0–36.0)
MCV: 91.2 fL (ref 80.0–100.0)
Monocytes Absolute: 0.5 10*3/uL (ref 0.1–1.0)
Monocytes Relative: 8 %
Neutro Abs: 4.1 10*3/uL (ref 1.7–7.7)
Neutrophils Relative %: 66 %
Platelets: 216 10*3/uL (ref 150–400)
RBC: 3.99 MIL/uL (ref 3.87–5.11)
RDW: 13.9 % (ref 11.5–15.5)
WBC: 6.2 10*3/uL (ref 4.0–10.5)
nRBC: 0 % (ref 0.0–0.2)

## 2019-03-01 LAB — T4, FREE: Free T4: 1.1 ng/dL (ref 0.82–1.77)

## 2019-03-01 LAB — TSH: TSH: 1.606 u[IU]/mL (ref 0.350–4.500)

## 2019-03-01 MED ORDER — FULVESTRANT 250 MG/5ML IM SOLN
500.0000 mg | Freq: Once | INTRAMUSCULAR | Status: AC
  Administered 2019-03-01: 500 mg via INTRAMUSCULAR

## 2019-03-01 NOTE — Progress Notes (Signed)
No new changes noted today 

## 2019-03-01 NOTE — Patient Instructions (Signed)

## 2019-03-02 ENCOUNTER — Telehealth: Payer: Self-pay

## 2019-03-02 NOTE — Telephone Encounter (Signed)
Spoke with Kelli Williams to inform her that her TSH and Free T 4 was normal. The patient was understanding.

## 2019-03-02 NOTE — Telephone Encounter (Signed)
-----   Message from Lequita Asal, MD sent at 03/01/2019  4:54 PM EDT ----- Regarding: Please call patient  Please notify her that her TSH and free T4 are normal.  M ----- Message ----- From: Buel Ream, Lab [facility] [place] Sent: 03/01/2019   4:41 PM EDT To: Lequita Asal, MD

## 2019-03-23 ENCOUNTER — Other Ambulatory Visit: Payer: Self-pay | Admitting: Internal Medicine

## 2019-03-23 DIAGNOSIS — G5793 Unspecified mononeuropathy of bilateral lower limbs: Secondary | ICD-10-CM

## 2019-03-29 ENCOUNTER — Other Ambulatory Visit: Payer: Self-pay

## 2019-03-29 ENCOUNTER — Inpatient Hospital Stay: Payer: Medicare Other | Attending: Hematology and Oncology

## 2019-03-29 ENCOUNTER — Inpatient Hospital Stay: Payer: Medicare Other

## 2019-03-29 ENCOUNTER — Ambulatory Visit: Payer: Medicare Other

## 2019-03-29 ENCOUNTER — Other Ambulatory Visit: Payer: Medicare Other

## 2019-03-29 VITALS — BP 157/76 | HR 73 | Temp 97.4°F

## 2019-03-29 DIAGNOSIS — Z170 Estrogen receptor positive status [ER+]: Secondary | ICD-10-CM | POA: Insufficient documentation

## 2019-03-29 DIAGNOSIS — M85851 Other specified disorders of bone density and structure, right thigh: Secondary | ICD-10-CM

## 2019-03-29 DIAGNOSIS — C50412 Malignant neoplasm of upper-outer quadrant of left female breast: Secondary | ICD-10-CM | POA: Diagnosis not present

## 2019-03-29 DIAGNOSIS — C7951 Secondary malignant neoplasm of bone: Secondary | ICD-10-CM

## 2019-03-29 LAB — CBC WITH DIFFERENTIAL/PLATELET
Abs Immature Granulocytes: 0.01 10*3/uL (ref 0.00–0.07)
Basophils Absolute: 0 10*3/uL (ref 0.0–0.1)
Basophils Relative: 1 %
Eosinophils Absolute: 0.3 10*3/uL (ref 0.0–0.5)
Eosinophils Relative: 5 %
HCT: 36.7 % (ref 36.0–46.0)
Hemoglobin: 12 g/dL (ref 12.0–15.0)
Immature Granulocytes: 0 %
Lymphocytes Relative: 20 %
Lymphs Abs: 1.4 10*3/uL (ref 0.7–4.0)
MCH: 28.9 pg (ref 26.0–34.0)
MCHC: 32.7 g/dL (ref 30.0–36.0)
MCV: 88.4 fL (ref 80.0–100.0)
Monocytes Absolute: 0.6 10*3/uL (ref 0.1–1.0)
Monocytes Relative: 8 %
Neutro Abs: 4.6 10*3/uL (ref 1.7–7.7)
Neutrophils Relative %: 66 %
Platelets: 216 10*3/uL (ref 150–400)
RBC: 4.15 MIL/uL (ref 3.87–5.11)
RDW: 14.1 % (ref 11.5–15.5)
WBC: 7 10*3/uL (ref 4.0–10.5)
nRBC: 0 % (ref 0.0–0.2)

## 2019-03-29 LAB — COMPREHENSIVE METABOLIC PANEL
ALT: 14 U/L (ref 0–44)
AST: 22 U/L (ref 15–41)
Albumin: 4 g/dL (ref 3.5–5.0)
Alkaline Phosphatase: 66 U/L (ref 38–126)
Anion gap: 9 (ref 5–15)
BUN: 26 mg/dL — ABNORMAL HIGH (ref 8–23)
CO2: 29 mmol/L (ref 22–32)
Calcium: 9.3 mg/dL (ref 8.9–10.3)
Chloride: 101 mmol/L (ref 98–111)
Creatinine, Ser: 0.87 mg/dL (ref 0.44–1.00)
GFR calc Af Amer: >60 mL/min (ref >60)
GFR calc non Af Amer: 58 mL/min — ABNORMAL LOW (ref >60)
Glucose, Bld: 101 mg/dL — ABNORMAL HIGH (ref 70–99)
Potassium: 3.9 mmol/L (ref 3.5–5.1)
Sodium: 139 mmol/L (ref 135–145)
Total Bilirubin: 0.4 mg/dL (ref 0.3–1.2)
Total Protein: 7.7 g/dL (ref 6.5–8.1)

## 2019-03-29 MED ORDER — FULVESTRANT 250 MG/5ML IM SOLN
500.0000 mg | Freq: Once | INTRAMUSCULAR | Status: AC
  Administered 2019-03-29: 500 mg via INTRAMUSCULAR

## 2019-04-22 NOTE — Progress Notes (Signed)
[facility]  [address], [address] [place], [place] [address] Phone: [phone]  Fax: [phone]   Clinic Day:  04/26/2019  Referring physician: Glean Hess, MD  Chief Complaint: Kelli Williams is a 85 y.o. female with metastatic breast cancer who is seen for 2 month assessment and continuation of monthly Faslodex.  HPI: The patient was last seen in the medical oncology clinic on 03/01/2019.  At that time, she was doing well overall. She denied any acute concerns.  She continued to have dental issues despite seeing multiple providers. She notes that no one (dentist) could agree on a plan of care. She had chronic back and joint pain. She complained of pain in her LEFT axilla beneath her surgical scar. Exam was grossly unremarkable. WBC 6200 ([place] 4100). TSH was 1.606 and free T4 1.10 (normal). She received Faslodex.   CBC on 03/29/2019: WBC 7,000, hemoglobin 12.0, platelets 216,000. BUN was 26 and creatinine 0.87.  Liver function studies were normal. She received Faslodex.   During the interim, she reports she is "okay." She has increased lower back and bilateral knee pain.  Knee pain is due to "bone on bone" in the left knee.  She denies any trauma or fall to cause her back pain.  She has chronic edema of her bilateral ankles.   She saw Dr. Laurena Spies, dentist, who proposed a solution for her dental issues.  She had previously been told her tooth could not be pulled due to concerns about an infection if it could not heal.  She has not been back to the dentist secondary to COVID-19 limitations.   Past Medical History:  Diagnosis Date  . Anxiety   . Arthritis    BOTH FEET AND KNEES  . Breast cancer ([place]) 2000   left breast ca with lumpectomy and rad tx 5 yr tamoxifen/Dr Jeannine Kitten at [place]  . Complication of anesthesia    WOKE UP DURING SURGERY FOR DEVIATED SEPTUM, KNEE REPLACEMENT AND BREAST LUMPECTOMY  . Dysrhythmia   . GERD (gastroesophageal reflux disease)    . Hypertension   . Neuropathic pain of both legs   . Neuropathy   . Pneumonia 2016  . Skin cancer of forehead   . Skin cancer of nose     Past Surgical History:  Procedure Laterality Date  . ABDOMINAL HYSTERECTOMY    . AXILLARY LYMPH NODE BIOPSY Left 07/10/2017   INVASIVE MAMMARY CARCINOMA WITH FOCAL LOBULAR FEATURES.   Marland Kitchen BREAST BIOPSY Bilateral 1960   benign  . BREAST LUMPECTOMY Left 2000   breast ca with rad tx  . BREAST LUMPECTOMY Left 08/20/2017  . BREAST LUMPECTOMY Left 08/20/2017   Procedure: left breast wide excision;  Surgeon: Robert Bellow, MD;  Location: [facility] ORS;  Service: General;  Laterality: Left;  . CATARACT EXTRACTION     left eye  . CHOLECYSTECTOMY    . JOINT REPLACEMENT    . NASAL SEPTUM SURGERY    . PARTIAL KNEE ARTHROPLASTY     right  . TOTAL VAGINAL HYSTERECTOMY      Family History  Problem Relation Age of Onset  . Heart disease Mother   . Heart attack Paternal Uncle   . Breast cancer Daughter 32       genetic negative  . Cancer Paternal Grandmother     Social History:  reports that she has never smoked. She has never used smokeless tobacco. She reports that she does not drink alcohol or use drugs. The patient's  husband died 2 years ago.  She was born outside of [place].  She previously worked as an Web designer.  She lives at the [place] at [place].  She is independent except for the use of her walker. Patient has a daughter, Kelli Williams.  The patient is alone today.  Allergies:  Allergies  Allergen Reactions  . Amitriptyline Hives  . Amoxicillin     Diarrhea    . Ivp Dye [Iodinated Diagnostic Agents]   . Tape Other (See Comments)    [place] - please use paper tape.  . Sulfa Antibiotics Rash  . Sulfa Antibiotics Rash    Current Medications: Current Outpatient Medications  Medication Sig Dispense Refill  . acetaminophen (TYLENOL) 325 MG tablet Take 650 mg by mouth every 6 (six) hours as needed.    Marland Kitchen aspirin 81 MG  tablet Take 1 tablet by mouth daily.    . Calcium-Magnesium 500-250 MG TABS Take 2 tablets by mouth daily.     . clindamycin (CLEOCIN) 300 MG capsule Take 300 mg by mouth once. Prior to dental appointment    . Coenzyme Q10 (CO Q-10) 100 MG CAPS Take by mouth daily.    . diphenoxylate-atropine (LOMOTIL) 2.5-0.025 MG tablet Take 1 tablet by mouth 4 (four) times daily as needed for diarrhea or loose stools. 30 tablet 0  . ezetimibe (ZETIA) 10 MG tablet Take 1 tablet (10 mg total) by mouth daily. 90 tablet 3  . gabapentin (NEURONTIN) 300 MG capsule TAKE 3 CAPSULES BY MOUTH  TWO TIMES DAILY 540 capsule 1  . lisinopril-hydrochlorothiazide (PRINZIDE,ZESTORETIC) 20-25 MG tablet Take 1.5 tablets by mouth daily. 135 tablet 3  . meloxicam (MOBIC) 15 MG tablet TAKE 1 TABLET BY MOUTH  DAILY 90 tablet 1  . Multiple Vitamins-Minerals (MULTIVITAMIN WITH MINERALS) tablet Take 1 tablet by mouth daily.    Marland Kitchen omeprazole (PRILOSEC) 20 MG capsule TAKE 1 CAPSULE BY MOUTH  DAILY 90 capsule 1  . propranolol (INDERAL) 10 MG tablet Take 1 tablet (10 mg total) by mouth 3 (three) times daily as needed. 90 tablet 3  . traMADol (ULTRAM) 50 MG tablet TAKE 1/2 TO 1 TABLET BY  MOUTH EVERY 8 HOURS AS  NEEDED FOR PAIN 90 tablet 0  . chlorhexidine gluconate, MEDLINE KIT, (PERIDEX) 0.12 % solution     . thiamine 100 MG tablet Take 1 tablet by mouth daily.     No current facility-administered medications for this visit.     Review of Systems  Constitutional: Negative.  Negative for chills, diaphoresis, fever, malaise/fatigue and weight loss (up 4 pounds).       Doing "okay."  HENT: Negative for congestion, ear pain, nosebleeds, sinus pain and sore throat.        Dental issues; has seen multiple providers.  Eyes: Negative.  Negative for double vision, photophobia and pain.  Respiratory: Negative.  Negative for cough, hemoptysis, sputum production and shortness of breath.   Cardiovascular: Positive for leg swelling (chronic).  Negative for chest pain, palpitations, orthopnea and PND.  Gastrointestinal: Negative.  Negative for abdominal pain, blood in stool, constipation, diarrhea, melena, nausea and vomiting.  Genitourinary: Negative.  Negative for dysuria, frequency, hematuria and urgency.  Musculoskeletal: Positive for back pain (worsening) and joint pain (chronic LEFT knee "bone on bone" and BILATERAL hip pain). Negative for falls, myalgias and neck pain.  Skin: Negative.  Negative for itching and rash.  Neurological: Positive for sensory change (stable neuropathy; on gabapentin). Negative for tremors, speech change, focal weakness, weakness and  headaches.       Chronic balance issues  Endo/Heme/Allergies: Negative.  Does not bruise/bleed easily.  Psychiatric/Behavioral: Negative.  Negative for depression and memory loss. The patient is not nervous/anxious and does not have insomnia.   All other systems reviewed and are negative.  Performance status (ECOG):  2  Physical Exam  Constitutional: She is oriented to person, place, and time. Vital signs are normal.  Elderly woman sitting comfortably in the exam room in no acute distress.  She has a rolling walker at her side.  She needs assistance onto the exam table.  HENT:  Head: Normocephalic and atraumatic.  Mouth/Throat: Oropharynx is clear and moist. No oropharyngeal exudate.  Gray hair.  Wearing mask.  Eyes: Pupils are equal, round, and reactive to light. Conjunctivae and EOM are normal. No scleral icterus.  Neck: Normal range of motion. Neck supple. No JVD present.  Cardiovascular: Normal rate, regular rhythm and normal heart sounds. Exam reveals no gallop.  No murmur heard. Pulmonary/Chest: Effort normal and breath sounds normal. No respiratory distress. She has no wheezes. She has no rales. She exhibits no tenderness.  Abdominal: Soft. Normal appearance and bowel sounds are normal. She exhibits no distension and no mass. There is no abdominal tenderness.   Musculoskeletal:        General: Tenderness (bilateral ankles) and edema (bilateral ankles) present.     Thoracic back: She exhibits tenderness and bony tenderness.     Lumbar back: She exhibits tenderness and bony tenderness.     Comments: Pain on palpation in lumbar and sacral regions.  Lymphadenopathy:    She has no cervical adenopathy.    She has no axillary adenopathy.       Right: No supraclavicular adenopathy present.       Left: No supraclavicular adenopathy present.  Neurological: She is alert and oriented to person, place, and time.  Skin: Skin is warm and dry. No rash noted. She is not diaphoretic. There is pallor.  Psychiatric: She has a normal mood and affect. Her behavior is normal. Judgment and thought content normal.  Nursing note and vitals reviewed.   Imaging studies: 09/16/2017:  PET scan revealed widespread scattered hypermetabolic lytic osseous metastases throughout the axial and proximal appendicular skeleton.  Bilateral proximal femoral skeletal metastases placed the patient at risk for pathologic hip fractures.  There was mild hypermetabolic right hilar lymphadenopathy.   09/23/2017:  Plain films of the bilateral femurs revealed no pathologic fracture. The LEFT view showed lytic subtrochanteric metastasis that was previously seen on PET scan.  06/01/2018:  PET scan demonstrated marked improvement in the osseous metastatic disease.  Previously demonstrated areas of abnormally high metabolic activity have resolved, with the exception of a very faint area of residual activity (SUV 2.6; previously 11.5) in the LEFT subtrochanteric region. Previous LEFT axillary fluid collection now shows a 1.8 x 1.2 cm scarlike density with low-grade activity (SUV 2.7). Incidental findings include a small hiatal hernia and diverticulosis of the sigmoid colon.  02/11/2018:  Lumbar spine MRI revealed multifocal lumbosacral vertebral body signal abnormalities consistent with osseous metastatic  disease. The largest lesion was at L1.  There was no pathologic compression fracture. There was multi-level moderate to severe neural foraminal stenosis, which had progressed since the prior study. 09/26/2018:  Sacral MRI revealed scattered small marrow lesionsc/wmetastatic disease.The0.6 cm lesion in the S1 vertebral bodywasnew since the comparison lumbar spine MRI. Small lesion in the left ilium adjacent to the SI joint was present on the prior MRI.  The remaining lesions werenot included on the prior MRI. There was nofracture or other acute abnormality.There was lower lumbar spondylosisand diverticulosis. 10/15/2018:  PET scan revealed no evidence of active breast cancer metastasis on FDG PET scan.  There were stable lytic lesions in the spine without evidence of metabolic activity consistent treated metastasis.   Appointment on 04/26/2019  Component Date Value Ref Range Status  . Sodium 04/26/2019 135  135 - 145 mmol/L Final  . Potassium 04/26/2019 4.1  3.5 - 5.1 mmol/L Final  . Chloride 04/26/2019 99  98 - 111 mmol/L Final  . CO2 04/26/2019 27  22 - 32 mmol/L Final  . Glucose, Bld 04/26/2019 99  70 - 99 mg/dL Final  . BUN 04/26/2019 31* 8 - 23 mg/dL Final  . Creatinine, Ser 04/26/2019 0.86  0.44 - 1.00 mg/dL Final  . Calcium 04/26/2019 9.2  8.9 - 10.3 mg/dL Final  . Total Protein 04/26/2019 7.2  6.5 - 8.1 g/dL Final  . Albumin 04/26/2019 3.9  3.5 - 5.0 g/dL Final  . AST 04/26/2019 22  15 - 41 U/L Final  . ALT 04/26/2019 12  0 - 44 U/L Final  . Alkaline Phosphatase 04/26/2019 69  38 - 126 U/L Final  . Total Bilirubin 04/26/2019 0.5  0.3 - 1.2 mg/dL Final  . GFR calc non Af Amer 04/26/2019 59* >60 mL/min Final  . GFR calc Af Amer 04/26/2019 >60  >60 mL/min Final  . Anion gap 04/26/2019 9  5 - 15 Final   Performed at [facility], [address], [place], [place] [address]  . WBC 04/26/2019 5.8  4.0 - 10.5 K/uL Final  . RBC 04/26/2019 4.09  3.87 - 5.11 MIL/uL Final   . Hemoglobin 04/26/2019 11.9* 12.0 - 15.0 g/dL Final  . HCT 04/26/2019 35.8* 36.0 - 46.0 % Final  . MCV 04/26/2019 87.5  80.0 - 100.0 fL Final  . MCH 04/26/2019 29.1  26.0 - 34.0 pg Final  . MCHC 04/26/2019 33.2  30.0 - 36.0 g/dL Final  . RDW 04/26/2019 14.6  11.5 - 15.5 % Final  . Platelets 04/26/2019 209  150 - 400 K/uL Final  . nRBC 04/26/2019 0.0  0.0 - 0.2 % Final  . Neutrophils Relative % 04/26/2019 64  % Final  . Neutro Abs 04/26/2019 3.8  1.7 - 7.7 K/uL Final  . Lymphocytes Relative 04/26/2019 18  % Final  . Lymphs Abs 04/26/2019 1.0  0.7 - 4.0 K/uL Final  . Monocytes Relative 04/26/2019 9  % Final  . Monocytes Absolute 04/26/2019 0.5  0.1 - 1.0 K/uL Final  . Eosinophils Relative 04/26/2019 8  % Final  . Eosinophils Absolute 04/26/2019 0.4  0.0 - 0.5 K/uL Final  . Basophils Relative 04/26/2019 1  % Final  . Basophils Absolute 04/26/2019 0.1  0.0 - 0.1 K/uL Final  . Immature Granulocytes 04/26/2019 0  % Final  . Abs Immature Granulocytes 04/26/2019 0.02  0.00 - 0.07 K/uL Final   Performed at [facility], [address], [place], [place] [address]    Assessment:  Kelli Williams is a 85 y.o. female with metastatic breast cancer s/p left axillary biopsy on 07/10/2017.  Pathology revealed a 1.1 cm grade II invasive mammary carcinoma with focal lobular features.   Wide excision on 08/20/2017 revealed metastatic mammary carcinoma involving 1 of 4 axillary lymph nodes with extracapsular extension.  The superior medial margin was involved.  Tumor was ER + (> 90%), PR + (>  90%), and Her2/neu 2+ (FISH -).   [facility] genetic testing on 09/26/2017 revealed no pathogenic sequence variants or deletions/duplications.  BRCA1/2 testing on 09/26/2017 was negative.  She has a history of left breast cancer s/p wide excision and sentinel lymph node biopsy on 07/18/1999 at [facility].  Pathology revealed a 0.8 x 0.6 x 0.4 cm grade II invasive ductal adenocarcinoma.  There was no  lymphovascular invasion.  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  Margins were negative.  Tumor was ER + (50%) and PR + (90%).  Pathologic stage was T1bN0.  She received 6200 cGy from 08/21/1999 - 10/03/1999.  She completed 5 years of tamoxifen in 06/2004.    PET scan on 09/16/2017 revealed widespread scattered hypermetabolic lytic osseous metastases throughout the axial and proximal appendicular skeleton.  Bilateral proximal femoral skeletal metastases placed the patient at risk for pathologic hip fractures.  There was mild hypermetabolic right hilar lymphadenopathy.  Plain films of the bilateral femurs on 09/23/2017 revealed no pathologic fracture. The LEFT view showed lytic subtrochanteric metastasis that was previously seen on PET scan.   PET scan on 10/15/2018 revealed no evidence of active breast cancer metastasis on FDG PET scan.  There were stable lytic lesions in the spine without evidence of metabolic activity consistent treated metastasis.  CA27.29 has been followed:  14.7 on 02/05/2017, 19.1 on 02/06/2012, 172.2 on 09/04/2017, 144.9 on 11/06/2017, 73.4 on 02/05/2018, 63.3 on 03/06/2018, 47.2 on 04/06/2018, 42.4 on 05/07/2018, 36.6 on 06/08/2018, 34.6 on 07/06/2018, 27.1 on 08/06/2018, 28.2 on 09/10/2018,  26.0 on 10/09/2018, 30.9 on 11/09/2018, 27.7 on 12/07/2018, 35.8 on 01/04/2019, and 24.8 on 02/01/2019.  She received a palliative course of radiation (3000 cGy in 10 fractions) to the left hip from 10/14/2017 - 10/27/2017.  She received a course of palliative radiation the the lumbar spine and SI joints from 03/04/2018 - 03/27/2018.  She began Faslodex on 09/26/2017 (last 03/29/2019).  She began monthly Xgeva on 09/26/2017 (last 05/07/2018).  She has dental issues.  Bone density on 09/01/2017 revealed osteopenia with a T-score of -1.9 in the right femoral neck and left forearm. She is on calcium and vitamin D.  Symptomatically, she notes increased back pain without  trauma.  She has chronic knee pain.    Plan: 1. Labs today:  CBC with diff, CMP, CA27.29. 2. Metastatic breast cancer Clinically she was doing well until recently. She has increased lumbar/sacral pain in site of old metastasis. Compared PET images from 09/16/2017 and 10/15/2018 with patient today.  She had marked improvement of metastatic disease to several sites in axial skeleton in 2019. Unclear if current increase in pain represents increased metastasis and failure of Faslodex.  She has been on Faslodex for 19 months.  Prior plan was for restaging studies in 04/2019.  Faslodex due today. Discuss plan for follow-up PET scan to assess disease activity. Discuss plan for change in therapy if disease has progressed. 3. Bone metastasis PET scan on 10/15/2018 revealed no significant active disease. Discuss plan for repeat PET scan as above. Delton See remains on hold secondary to dental issues. Continue calcium and vitamin D. 4. Dental issues Dental issues remains. Patient plans to follow-up with Dr. Laurena Spies once COVID-19 restrictions lifted. 5. Elevated free T4 Review last TSH and free T4 (normal) with patient today. 6.   PET scan next week for restaging and assessment of increased back pain. 7.   RTC in 1 month for MD assessment, labs (CBC with diff, CMP, CA27.29), review of PET scan,  and +/- Faslodex.  I discussed the assessment and treatment plan with the patient.  The patient was provided an opportunity to ask questions and all were answered.  The patient agreed with the plan and demonstrated an understanding of the instructions.  The patient was advised to call back if the symptoms worsen or if the condition fails to improve as anticipated.  I provided 25 minutes of face-to-face time during this this encounter and > 50% was spent counseling as documented under my assessment and plan.    Lequita Asal, MD, PhD    04/26/2019, 10:33 AM  I, Molly Dorshimer, am acting as Education administrator for [employer]. Mike Gip, MD, PhD.  I, Melissa C. Mike Gip, MD, have reviewed the above documentation for accuracy and completeness, and I agree with the above.

## 2019-04-26 ENCOUNTER — Inpatient Hospital Stay: Payer: Medicare Other

## 2019-04-26 ENCOUNTER — Other Ambulatory Visit: Payer: Self-pay

## 2019-04-26 ENCOUNTER — Inpatient Hospital Stay: Payer: Medicare Other | Attending: Hematology and Oncology | Admitting: Hematology and Oncology

## 2019-04-26 ENCOUNTER — Encounter: Payer: Self-pay | Admitting: Hematology and Oncology

## 2019-04-26 VITALS — BP 162/80 | HR 58 | Temp 97.8°F | Resp 18 | Wt 202.2 lb

## 2019-04-26 DIAGNOSIS — Z5111 Encounter for antineoplastic chemotherapy: Secondary | ICD-10-CM | POA: Diagnosis not present

## 2019-04-26 DIAGNOSIS — M545 Low back pain, unspecified: Secondary | ICD-10-CM | POA: Insufficient documentation

## 2019-04-26 DIAGNOSIS — R600 Localized edema: Secondary | ICD-10-CM

## 2019-04-26 DIAGNOSIS — M25561 Pain in right knee: Secondary | ICD-10-CM

## 2019-04-26 DIAGNOSIS — M25562 Pain in left knee: Secondary | ICD-10-CM

## 2019-04-26 DIAGNOSIS — Z170 Estrogen receptor positive status [ER+]: Secondary | ICD-10-CM

## 2019-04-26 DIAGNOSIS — C7951 Secondary malignant neoplasm of bone: Secondary | ICD-10-CM | POA: Diagnosis not present

## 2019-04-26 DIAGNOSIS — C50412 Malignant neoplasm of upper-outer quadrant of left female breast: Secondary | ICD-10-CM

## 2019-04-26 DIAGNOSIS — Z923 Personal history of irradiation: Secondary | ICD-10-CM

## 2019-04-26 DIAGNOSIS — M8580 Other specified disorders of bone density and structure, unspecified site: Secondary | ICD-10-CM

## 2019-04-26 LAB — CBC WITH DIFFERENTIAL/PLATELET
Abs Immature Granulocytes: 0.02 10*3/uL (ref 0.00–0.07)
Basophils Absolute: 0.1 10*3/uL (ref 0.0–0.1)
Basophils Relative: 1 %
Eosinophils Absolute: 0.4 10*3/uL (ref 0.0–0.5)
Eosinophils Relative: 8 %
HCT: 35.8 % — ABNORMAL LOW (ref 36.0–46.0)
Hemoglobin: 11.9 g/dL — ABNORMAL LOW (ref 12.0–15.0)
Immature Granulocytes: 0 %
Lymphocytes Relative: 18 %
Lymphs Abs: 1 10*3/uL (ref 0.7–4.0)
MCH: 29.1 pg (ref 26.0–34.0)
MCHC: 33.2 g/dL (ref 30.0–36.0)
MCV: 87.5 fL (ref 80.0–100.0)
Monocytes Absolute: 0.5 10*3/uL (ref 0.1–1.0)
Monocytes Relative: 9 %
Neutro Abs: 3.8 10*3/uL (ref 1.7–7.7)
Neutrophils Relative %: 64 %
Platelets: 209 10*3/uL (ref 150–400)
RBC: 4.09 MIL/uL (ref 3.87–5.11)
RDW: 14.6 % (ref 11.5–15.5)
WBC: 5.8 10*3/uL (ref 4.0–10.5)
nRBC: 0 % (ref 0.0–0.2)

## 2019-04-26 LAB — COMPREHENSIVE METABOLIC PANEL
ALT: 12 U/L (ref 0–44)
AST: 22 U/L (ref 15–41)
Albumin: 3.9 g/dL (ref 3.5–5.0)
Alkaline Phosphatase: 69 U/L (ref 38–126)
Anion gap: 9 (ref 5–15)
BUN: 31 mg/dL — ABNORMAL HIGH (ref 8–23)
CO2: 27 mmol/L (ref 22–32)
Calcium: 9.2 mg/dL (ref 8.9–10.3)
Chloride: 99 mmol/L (ref 98–111)
Creatinine, Ser: 0.86 mg/dL (ref 0.44–1.00)
GFR calc Af Amer: >60 mL/min (ref >60)
GFR calc non Af Amer: 59 mL/min — ABNORMAL LOW (ref >60)
Glucose, Bld: 99 mg/dL (ref 70–99)
Potassium: 4.1 mmol/L (ref 3.5–5.1)
Sodium: 135 mmol/L (ref 135–145)
Total Bilirubin: 0.5 mg/dL (ref 0.3–1.2)
Total Protein: 7.2 g/dL (ref 6.5–8.1)

## 2019-04-26 MED ORDER — FULVESTRANT 250 MG/5ML IM SOLN
500.0000 mg | Freq: Once | INTRAMUSCULAR | Status: AC
  Administered 2019-04-26: 500 mg via INTRAMUSCULAR

## 2019-04-26 NOTE — Patient Instructions (Signed)

## 2019-04-26 NOTE — Progress Notes (Signed)
Pt here for follow up. Patient is complaining of increased lower back pain and bilateral knee pain. Pain has been present for awhile but patient reports pain is worse.

## 2019-04-27 LAB — CANCER ANTIGEN 27.29: CA 27.29: 24.4 U/mL (ref 0.0–38.6)

## 2019-05-13 ENCOUNTER — Ambulatory Visit: Payer: Medicare Other

## 2019-05-20 ENCOUNTER — Ambulatory Visit
Admission: RE | Admit: 2019-05-20 | Discharge: 2019-05-20 | Disposition: A | Discharge status: Home / Self Care | Payer: Medicare Other | Source: Ambulatory Visit | Attending: Hematology and Oncology | Admitting: Hematology and Oncology

## 2019-05-20 ENCOUNTER — Other Ambulatory Visit: Payer: Self-pay

## 2019-05-20 DIAGNOSIS — Z79899 Other long term (current) drug therapy: Secondary | ICD-10-CM | POA: Diagnosis not present

## 2019-05-20 DIAGNOSIS — C50412 Malignant neoplasm of upper-outer quadrant of left female breast: Secondary | ICD-10-CM | POA: Diagnosis not present

## 2019-05-20 DIAGNOSIS — C7951 Secondary malignant neoplasm of bone: Secondary | ICD-10-CM | POA: Diagnosis not present

## 2019-05-20 DIAGNOSIS — Z170 Estrogen receptor positive status [ER+]: Secondary | ICD-10-CM | POA: Diagnosis not present

## 2019-05-20 DIAGNOSIS — C50912 Malignant neoplasm of unspecified site of left female breast: Secondary | ICD-10-CM | POA: Diagnosis not present

## 2019-05-20 LAB — GLUCOSE, CAPILLARY: Glucose-Capillary: 100 mg/dL — ABNORMAL HIGH (ref 70–99)

## 2019-05-20 MED ORDER — FLUDEOXYGLUCOSE F - 18 (FDG) INJECTION
10.7700 | Freq: Once | INTRAVENOUS | Status: AC | PRN
  Administered 2019-05-20: 10.77 via INTRAVENOUS

## 2019-05-20 NOTE — Progress Notes (Signed)
Kelli Williams  [address], [address] [place], [place] [address] Phone: [phone]  Fax: [phone]   Clinic Day:  05/24/2019  Referring physician: Glean Hess, MD  Chief Complaint: Kelli Williams is a 85 y.Kelli. female with metastatic breast cancer who is seen for61monthassessment, review of interval PET scan, and continuation of monthlyFaslodex.  HPI: The patient was last seen in the medical oncology clinic on 04/26/2019.  At that time, she noted increased back pain without trauma. She had chronic knee pain. Hemoglobin was 11.9, hematocrit 35.8, CA27.29 24.4.  She received Faslodex.   PET scan on 05/20/2019 revealed no evidence of breast cancer recurrence or progression. There were stable lytic lesions in the spine without associated metabolic activity.  CT data was degraded by patient motion.  During the interim, she has felt "fine".  She notes an allergy to pepper on foods served at the [employer]  She describes getting "hives" on her mouth and tongue after eating peppery foods.  She continues to have some difficulty with ambulation.  She denies any breast concerns.   Past Medical History:  Diagnosis Date  . Anxiety   . Arthritis    BOTH FEET AND KNEES  . Breast [place]   left breast ca with lumpectomy and rad tx 5 yr tamoxifen/Dr OJeannine Kittenat Williams . Complication of anesthesia    WOKE UP DURING SURGERY FOR DEVIATED SEPTUM, KNEE REPLACEMENT AND BREAST LUMPECTOMY  . Dysrhythmia   . GERD (gastroesophageal reflux disease)   . Hypertension   . Neuropathic pain of both legs   . Neuropathy   . Pneumonia 2016  . Skin cancer of forehead   . Skin cancer of nose     Past Surgical History:  Procedure Laterality Date  . ABDOMINAL HYSTERECTOMY    . AXILLARY LYMPH NODE BIOPSY Left 07/10/2017   INVASIVE MAMMARY CARCINOMA WITH FOCAL LOBULAR FEATURES.   .Marland KitchenBREAST BIOPSY Bilateral 1960   benign  . BREAST LUMPECTOMY Left 2000   breast  ca with rad tx  . BREAST LUMPECTOMY Left 08/20/2017  . BREAST LUMPECTOMY Left 08/20/2017   Procedure: left breast wide excision;  Surgeon: BRobert Bellow MD;  Location: [facility] ORS;  Service: General;  Laterality: Left;  . CATARACT EXTRACTION     left eye  . CHOLECYSTECTOMY    . JOINT REPLACEMENT    . NASAL SEPTUM SURGERY    . PARTIAL KNEE ARTHROPLASTY     right  . TOTAL VAGINAL HYSTERECTOMY      Family History  Problem Relation Age of Onset  . Heart disease Mother   . Heart attack Paternal Uncle   . Breast cancer Daughter 464      genetic negative  . Cancer Paternal Grandmother     Social History:  reports that she has never smoked. She has never used smokeless tobacco. She reports that she does not drink alcohol or use drugs. The patient's husband died 2 years ago. 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 Patient has a daughter, MBerdene Williams The patient is alone today.  Allergies:  Allergies  Allergen Reactions  . Amitriptyline Hives  . Amoxicillin     Diarrhea    . Black Pepper [[place]]   . Ivp Dye [Iodinated Diagnostic Agents]   . Tape Other (See Comments)  [place]ps - please use paper tape.  . Sulfa Antibiotics Rash  . Sulfa Antibiotics Rash    Current Medications: Current Outpatient Medications  Medication Sig Dispense Refill  . acetaminophen (TYLENOL) 325 MG tablet Take 650 mg by mouth every 6 (six) hours as needed.    Marland Kitchen aspirin 81 MG tablet Take 1 tablet by mouth daily.    . Calcium-Magnesium 500-250 MG TABS Take 1 tablet by mouth daily.     . chlorhexidine gluconate, MEDLINE KIT, (PERIDEX) 0.12 % solution Use as directed 5 mLs in the mouth or throat 2 (two) times daily.     . clindamycin (CLEOCIN) 300 MG capsule Take 300 mg by mouth once. Prior to dental appointment    . Coenzyme Q10 (CO Q-10) 100 MG CAPS Take by  mouth daily.    . diphenoxylate-atropine (LOMOTIL) 2.5-0.025 MG tablet Take 1 tablet by mouth 4 (four) times daily as needed for diarrhea or loose stools. 30 tablet 0  . ezetimibe (ZETIA) 10 MG tablet Take 1 tablet (10 mg total) by mouth daily. 90 tablet 3  . gabapentin (NEURONTIN) 300 MG capsule TAKE 3 CAPSULES BY MOUTH  TWO TIMES DAILY 540 capsule 1  . lisinopril-hydrochlorothiazide (PRINZIDE,ZESTORETIC) 20-25 MG tablet Take 1.5 tablets by mouth daily. 135 tablet 3  . meloxicam (MOBIC) 15 MG tablet TAKE 1 TABLET BY MOUTH  DAILY 90 tablet 1  . Multiple Vitamins-Minerals (MULTIVITAMIN WITH MINERALS) tablet Take 1 tablet by mouth daily.    Marland Kitchen omeprazole (PRILOSEC) 20 MG capsule TAKE 1 CAPSULE BY MOUTH  DAILY 90 capsule 1  . propranolol (INDERAL) 10 MG tablet Take 1 tablet (10 mg total) by mouth 3 (three) times daily as needed. 90 tablet 3  . thiamine 100 MG tablet Take 1 tablet by mouth daily.    . traMADol (ULTRAM) 50 MG tablet TAKE 1/2 TO 1 TABLET BY  MOUTH EVERY 8 HOURS AS  NEEDED FOR PAIN 90 tablet 0   No current facility-administered medications for this visit.     Review of Systems  Constitutional: Positive for weight loss (3 pounds). Negative for chills, diaphoresis, fever and malaise/fatigue.       Feels "fine".  HENT: Negative for congestion, ear pain, nosebleeds, sinus pain and sore throat.        Dental issues (chronic).  Eyes: Negative.  Negative for blurred vision, double vision, photophobia and pain.  Respiratory: Negative.  Negative for cough, hemoptysis, sputum production and shortness of breath.   Cardiovascular: Positive for leg swelling (chronic). Negative for chest pain, palpitations and PND.       Takes metoprolol if heart racing.  Gastrointestinal: Negative.  Negative for abdominal pain, blood in stool, constipation, diarrhea, melena, nausea and vomiting.  Genitourinary: Negative.  Negative for dysuria, frequency, hematuria and urgency.  Musculoskeletal: Positive for  back pain (chronic) and joint pain (chronic LEFT knee "bone on bone" and BILATERAL hip pain). Negative for falls, myalgias and neck pain.  Skin: Negative.  Negative for itching and rash.  Neurological: Positive for sensory change (stable neuropathy on gabapentin). Negative for tremors, speech change, focal weakness, weakness and headaches.       Chronic balance issues.  Endo/Heme/Allergies: Does not bruise/bleed easily.       Allergy to pepper.  Psychiatric/Behavioral: Negative.  Negative for depression and memory loss. The patient is not nervous/anxious and does not have insomnia.   All other systems reviewed and are negative.  Performance status (ECOG): 2  Physical Exam  Constitutional: She is oriented  to person, place, and time. Vital signs are normal. No distress.  Elderly woman sitting comfortably in the exam room in no acute distress.  She has a rolling walker at her side.  She is examined in her chair.  HENT:  Head: Normocephalic and atraumatic.  Mouth/Throat: Oropharynx is clear and moist. No oropharyngeal exudate.  Gray hair pulled back.  Wearing mask.  Eyes: Pupils are equal, round, and reactive to light. Conjunctivae and EOM are normal. No scleral icterus.  Neck: Normal range of motion. Neck supple. No JVD present.  Cardiovascular: Normal rate, regular rhythm and normal heart sounds. Exam reveals no gallop.  No murmur heard. Pulmonary/Chest: Effort normal and breath sounds normal. No respiratory distress. She has no wheezes. She has no rales. She exhibits no tenderness.  Abdominal: Soft. Normal appearance and bowel sounds are normal. She exhibits no distension and no mass. There is no abdominal tenderness. There is no rebound and no guarding.  Musculoskeletal:        General: Edema (chronic lower extremity; left > right) present. No tenderness (bilateral ankles).  Lymphadenopathy:    She has no cervical adenopathy.    She has no axillary adenopathy.       Right: No  supraclavicular adenopathy present.       Left: No supraclavicular adenopathy present.  Neurological: She is alert and oriented to person, place, and time.  Skin: Skin is warm and dry. No rash noted. She is not diaphoretic. No erythema. There is pallor.  Psychiatric: She has a normal mood and affect. Her behavior is normal. Judgment and thought content normal.  Nursing note and vitals reviewed.   Imaging studies: 09/16/2017: PET scanrevealed widespread scattered hypermetabolic lytic osseous metastasesthroughout the axial and proximal appendicular skeleton. Bilateral proximal femoral skeletal metastases placed the patient at risk for pathologic hip fractures. There was mild hypermetabolic right hilar lymphadenopathy.  09/23/2017: Plain films of the bilateral femursrevealed no pathologic fracture. The LEFT view showed lytic subtrochanteric metastasis that was previously seen on PET scan.  06/01/2018: PET scandemonstrated marked improvementin the osseous metastatic disease. Previously demonstrated areas of abnormally high metabolic activity have resolved, with the exception of a very faint area of residual activity (SUV 2.6; previously 11.5) in the LEFT subtrochanteric region. Previous LEFT axillary fluid collection now shows a 1.8 x 1.2 cm scarlike density with low-grade activity (SUV 2.7). Incidental findings include a small hiatal hernia and diverticulosis of the sigmoid colon.  02/11/2018: Lumbar spine MRI revealed multifocal lumbosacral vertebral body signal abnormalities consistent with osseous metastatic disease. The largest lesion was at L1. There was no pathologic compression fracture. There was multi-level moderate to severe neural foraminal stenosis, which had progressed since the prior study. 09/26/2018: Sacral MRI revealed scattered small marrow lesionsc/wmetastatic disease.The0.6 cm lesion in the S1 vertebral bodywasnew since the comparison lumbar spine MRI. Small lesion  in the left ilium adjacent to the SI joint was present on the prior MRI. The remaining lesions werenot included on the prior MRI. There was nofracture or other acute abnormality.There was lower lumbar spondylosisand diverticulosis. 10/15/2018: PET scanrevealed no evidence of active breast cancer metastasis on FDG PET scan. There were stable lytic lesions in the spine without evidence of metabolic activity consistent treated metastasis. 05/20/2019:  PET scan revealed no evidence of breast cancer recurrence or progression. There were stable lytic lesions in the spine without associated metabolic activity.  CT data was degraded by patient motion.   Appointment on 05/24/2019  Component Date Value Ref Range Status  .  Sodium 05/24/2019 135  135 - 145 mmol/L Final  . Potassium 05/24/2019 3.8  3.5 - 5.1 mmol/L Final  . Chloride 05/24/2019 100  98 - 111 mmol/L Final  . CO2 05/24/2019 26  22 - 32 mmol/L Final  . Glucose, Bld 05/24/2019 93  70 - 99 mg/dL Final  . BUN 05/24/2019 26* 8 - 23 mg/dL Final  . Creatinine, Ser 05/24/2019 0.99  0.44 - 1.00 mg/dL Final  . Calcium 05/24/2019 9.2  8.9 - 10.3 mg/dL Final  . Total Protein 05/24/2019 7.4  6.5 - 8.1 g/dL Final  . Albumin 05/24/2019 4.1  3.5 - 5.0 g/dL Final  . AST 05/24/2019 21  15 - 41 U/L Final  . ALT 05/24/2019 13  0 - 44 U/L Final  . Alkaline Phosphatase 05/24/2019 75  38 - 126 U/L Final  . Total Bilirubin 05/24/2019 0.7  0.3 - 1.2 mg/dL Final  . GFR calc non Af Amer 05/24/2019 50* >60 mL/min Final  . GFR calc Af Amer 05/24/2019 58* >60 mL/min Final  . Anion gap 05/24/2019 9  5 - 15 Final   Performed at [facility], [address], [place], [place] [address]  . WBC 05/24/2019 5.6  4.0 - 10.5 K/uL Final  . RBC 05/24/2019 4.09  3.87 - 5.11 MIL/uL Final  . Hemoglobin 05/24/2019 11.9* 12.0 - 15.0 g/dL Final  . HCT 05/24/2019 35.9* 36.0 - 46.0 % Final  . MCV 05/24/2019 87.8  80.0 - 100.0 fL Final  . MCH 05/24/2019 29.1  26.0  - 34.0 pg Final  . MCHC 05/24/2019 33.1  30.0 - 36.0 g/dL Final  . RDW 05/24/2019 14.8  11.5 - 15.5 % Final  . Platelets 05/24/2019 218  150 - 400 K/uL Final  . nRBC 05/24/2019 0.0  0.0 - 0.2 % Final  . Neutrophils Relative % 05/24/2019 63  % Final  . Neutro Abs 05/24/2019 3.6  1.7 - 7.7 K/uL Final  . Lymphocytes Relative 05/24/2019 19  % Final  . Lymphs Abs 05/24/2019 1.0  0.7 - 4.0 K/uL Final  . Monocytes Relative 05/24/2019 9  % Final  . Monocytes Absolute 05/24/2019 0.5  0.1 - 1.0 K/uL Final  . Eosinophils Relative 05/24/2019 7  % Final  . Eosinophils Absolute 05/24/2019 0.4  0.0 - 0.5 K/uL Final  . Basophils Relative 05/24/2019 1  % Final  . Basophils Absolute 05/24/2019 0.0  0.0 - 0.1 K/uL Final  . Immature Granulocytes 05/24/2019 1  % Final  . Abs Immature Granulocytes 05/24/2019 0.03  0.00 - 0.07 K/uL Final   Performed at [facility], [address] [place], [place] [address]    Assessment:  Kelli Williams is a 85 y.Kelli. female with metastatic breast cancer s/p left axillary biopsy on 07/10/2017. Pathology revealed a 1.1 cm grade II invasive mammary carcinoma with focal lobular features. Wide excisionon 08/20/2017 revealed metastatic mammary carcinoma involving 1 of 4 axillary lymph nodes with extracapsular extension. The superior medial margin was involved. Tumor was ER + (>90%), PR + (>90%), and Her2/neu 2+ (FISH -).   Inv[facility]ae genetic testingon 09/26/2017 revealed no pathogenic sequence variants or deletions/duplications. BRCA1/2 testing on 09/26/2017 was negative.  She has a history of left breast cancers/p wide excisionand sentinel lymph node biopsy on 07/18/1999 at [facility]. Pathology revealed a 0.8 x 0.6 x 0.4 cm grade II invasive ductal adenocarcinoma. There was no lymphovascular invasion. 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 Margins were negative. Tumor was  ER + (50%) and PR + (90%). Pathologic stagewas T1bN0. She received 6200  cGyfrom 08/21/1999 - 10/03/1999. She completed 5 years of tamoxifenin 06/2004.   PET scanon 09/16/2017 revealed widespread scattered hypermetabolic lytic osseous metastases throughout the axial and proximal appendicular skeleton. Bilateral proximal femoral skeletal metastases placed the patient at risk for pathologic hip fractures. There was mild hypermetabolic right hilar lymphadenopathy. Plain films of the bilateral femurson 09/23/2017 revealed no pathologic fracture. The LEFT view showed lytic subtrochanteric metastasis that was previously seen on PET scan.   PET scan on 05/20/2019 revealed no evidence of breast cancer recurrence or progression. There were stable lytic lesions in the spine without associated metabolic activity.  CT data was degraded by patient motion.  CA27.29has been followed: 14.7 on 02/05/2017, 19.1 on 02/06/2012, 172.2 on 09/04/2017, 144.9 on 11/06/2017, 73.4 on 02/05/2018, 63.3 on 03/06/2018, 47.2 on 04/06/2018, 42.4 on 05/07/2018, 36.6 on 06/08/2018, 34.6 on 07/06/2018, 27.1 on 08/06/2018, 28.2 on 09/10/2018, 26.0 on 10/09/2018, 30.9 on 11/09/2018, 27.7 on 12/07/2018, 35.8 on 01/04/2019, 24.8 on 02/01/2019, 24.2 on 04/26/2019, and 21.6 on 05/24/2019.  She received a palliative course of radiation(3000 cGy in 10 fractions) to the left hip from 10/14/2017 - 10/27/2017. She received a course of palliative radiationthe the lumbar spine and SI joints from 03/04/2018 - 03/27/2018.  She began Faslodexon 09/26/2017 (last 03/29/2019). She began monthly Xgevaon 09/26/2017 (last 05/07/2018). She has dental issues.  Bone densityon 09/01/2017 revealed osteopeniawith a T-score of -1.9 in the right femoral neck and left forearm. She is on calcium and vitamin D.  Symptomatically, she denies any breast concerns.  Exam is stable.  Plan: 1.   Labs today:CBC with diff, CMP,CA27.29. 2.   Metastatic breast cancer Clinically, she continues to do well. Exam reveals  no evidence of recurrence. PET scan on 05/20/2019 was personally reviewed.  There is no evidence of progression.  There was some motion artifact. CA27.29 is normal. Discuss continuation of Faslodex. 3.   Bone metastasis No evidence of active disease on recent PET scan. Delton See remains on permanent hold secondary to dental issues. Continue calcium and vitamin D.  4.   Dental issues Patient to follow-up with Dr. Laurena Spies once COVID-19 restrictions lifted. 5.   Pepper allergy Patient notes significant allergy to recent ingredient (? pepper) at the [facility] [place]. Discuss importance of addressing dietary restriction with staff. 6.   Faslodex today. 7.   RTC in 1 month for Faslodex. 8.   RTC in 2 months for MD assessment, labs (CBC with diff, CMP, CA27.29), and Faslodex.  I discussed the assessment and treatment plan with the patient.  The patient was provided an opportunity to ask questions and all were answered.  The patient agreed with the plan and demonstrated an understanding of the instructions.  The patient was advised to call back if the symptoms worsen or if the condition fails to improve as anticipated.   Lequita Asal, MD, PhD    05/24/2019, 11:21 AM

## 2019-05-21 ENCOUNTER — Other Ambulatory Visit: Payer: Self-pay

## 2019-05-24 ENCOUNTER — Telehealth: Payer: Self-pay | Admitting: Cardiovascular Disease

## 2019-05-24 ENCOUNTER — Inpatient Hospital Stay: Payer: Medicare Other

## 2019-05-24 ENCOUNTER — Telehealth: Payer: Self-pay

## 2019-05-24 ENCOUNTER — Other Ambulatory Visit: Payer: Self-pay

## 2019-05-24 ENCOUNTER — Encounter: Payer: Self-pay | Admitting: Hematology and Oncology

## 2019-05-24 ENCOUNTER — Inpatient Hospital Stay: Payer: Medicare Other | Attending: Hematology and Oncology | Admitting: Hematology and Oncology

## 2019-05-24 VITALS — BP 142/70 | HR 50 | Temp 97.8°F | Resp 18 | Wt 199.4 lb

## 2019-05-24 DIAGNOSIS — Z5111 Encounter for antineoplastic chemotherapy: Secondary | ICD-10-CM | POA: Insufficient documentation

## 2019-05-24 DIAGNOSIS — C50412 Malignant neoplasm of upper-outer quadrant of left female breast: Secondary | ICD-10-CM | POA: Diagnosis not present

## 2019-05-24 DIAGNOSIS — Z170 Estrogen receptor positive status [ER+]: Secondary | ICD-10-CM

## 2019-05-24 DIAGNOSIS — M8580 Other specified disorders of bone density and structure, unspecified site: Secondary | ICD-10-CM | POA: Insufficient documentation

## 2019-05-24 DIAGNOSIS — M85851 Other specified disorders of bone density and structure, right thigh: Secondary | ICD-10-CM | POA: Diagnosis not present

## 2019-05-24 DIAGNOSIS — Z803 Family history of malignant neoplasm of breast: Secondary | ICD-10-CM

## 2019-05-24 DIAGNOSIS — C7951 Secondary malignant neoplasm of bone: Secondary | ICD-10-CM | POA: Diagnosis not present

## 2019-05-24 DIAGNOSIS — Z923 Personal history of irradiation: Secondary | ICD-10-CM | POA: Diagnosis not present

## 2019-05-24 DIAGNOSIS — Z79899 Other long term (current) drug therapy: Secondary | ICD-10-CM

## 2019-05-24 DIAGNOSIS — Z809 Family history of malignant neoplasm, unspecified: Secondary | ICD-10-CM

## 2019-05-24 LAB — COMPREHENSIVE METABOLIC PANEL
ALT: 13 U/L (ref 0–44)
AST: 21 U/L (ref 15–41)
Albumin: 4.1 g/dL (ref 3.5–5.0)
Alkaline Phosphatase: 75 U/L (ref 38–126)
Anion gap: 9 (ref 5–15)
BUN: 26 mg/dL — ABNORMAL HIGH (ref 8–23)
CO2: 26 mmol/L (ref 22–32)
Calcium: 9.2 mg/dL (ref 8.9–10.3)
Chloride: 100 mmol/L (ref 98–111)
Creatinine, Ser: 0.99 mg/dL (ref 0.44–1.00)
GFR calc Af Amer: 58 mL/min — ABNORMAL LOW (ref >60)
GFR calc non Af Amer: 50 mL/min — ABNORMAL LOW (ref >60)
Glucose, Bld: 93 mg/dL (ref 70–99)
Potassium: 3.8 mmol/L (ref 3.5–5.1)
Sodium: 135 mmol/L (ref 135–145)
Total Bilirubin: 0.7 mg/dL (ref 0.3–1.2)
Total Protein: 7.4 g/dL (ref 6.5–8.1)

## 2019-05-24 LAB — CBC WITH DIFFERENTIAL/PLATELET
Abs Immature Granulocytes: 0.03 10*3/uL (ref 0.00–0.07)
Basophils Absolute: 0 10*3/uL (ref 0.0–0.1)
Basophils Relative: 1 %
Eosinophils Absolute: 0.4 10*3/uL (ref 0.0–0.5)
Eosinophils Relative: 7 %
HCT: 35.9 % — ABNORMAL LOW (ref 36.0–46.0)
Hemoglobin: 11.9 g/dL — ABNORMAL LOW (ref 12.0–15.0)
Immature Granulocytes: 1 %
Lymphocytes Relative: 19 %
Lymphs Abs: 1 10*3/uL (ref 0.7–4.0)
MCH: 29.1 pg (ref 26.0–34.0)
MCHC: 33.1 g/dL (ref 30.0–36.0)
MCV: 87.8 fL (ref 80.0–100.0)
Monocytes Absolute: 0.5 10*3/uL (ref 0.1–1.0)
Monocytes Relative: 9 %
Neutro Abs: 3.6 10*3/uL (ref 1.7–7.7)
Neutrophils Relative %: 63 %
Platelets: 218 10*3/uL (ref 150–400)
RBC: 4.09 MIL/uL (ref 3.87–5.11)
RDW: 14.8 % (ref 11.5–15.5)
WBC: 5.6 10*3/uL (ref 4.0–10.5)
nRBC: 0 % (ref 0.0–0.2)

## 2019-05-24 MED ORDER — FULVESTRANT 250 MG/5ML IM SOLN
500.0000 mg | Freq: Once | INTRAMUSCULAR | Status: AC
  Administered 2019-05-24: 12:00:00 500 mg via INTRAMUSCULAR

## 2019-05-24 NOTE — Telephone Encounter (Signed)
As she is asymptomatic would recommend just cautious use of the propranolol In the past blood pressure has been reasonable and if she is asymptomatic from bradycardia no intervention needed at this time

## 2019-05-24 NOTE — Progress Notes (Signed)
Pt here for follow up. Denies any concerns.  

## 2019-05-24 NOTE — Telephone Encounter (Signed)
I spoke with Cesalea from Dr. Kem Parkinson office. She states the patient was in today for an appointment and her HR was 50 bpm. Cesalea did confirm the patient is taking her propranolol PRN and reported her last dose was the night before last. The patient was asymptomatic, but Dr. Mike Gip wanted our office to be aware in case Dr. Rockey Situ wanted to change anything with her propranolol. I advised Cesalea I would call the patient directly to follow up on her HR's.  I spoke with the patient. She states "my heart rates always run low and have for some time." She has been on propranolol 10 mg TID PRN since 08/2014. She states she does not use this very often. She will typically take it at night when she feels her heart "pounding." She took it "one night last week," for her last dose.   She denies symptoms aside from very occasional lightheadedness. Her EKG last year showed a HR of 58 bpm with some PAC's. Looking back at graphs of her HR's she typically will run anywhere from 55-65 bpm on average.   I have advised the patient she may have been having some early beats at the time her HR was checked- they did check this automatically and manually per her report.    I have advised I do not feel that anything acute is going on due to her lack of symptoms, but will forward to Dr. Rockey Situ to review.  She is due for her 1 year appt in July with Dr. Rockey Situ- no appt made yet.   The patient voices understanding of the above and is agreeable.

## 2019-05-24 NOTE — Patient Instructions (Signed)

## 2019-05-24 NOTE — Telephone Encounter (Signed)
Informed receptionist of episode of Bradycardia while in office and last time Propranolol was taken per patient. Receptionist reports she will send nurse a message to have patient scheduled to see Dr. Rockey Situ.

## 2019-05-24 NOTE — Telephone Encounter (Signed)
Attempted to call Cesalea back. Per another staff member, Harriette Bouillon is at lunch.   I left a message for Cesalea to call back.

## 2019-05-24 NOTE — Telephone Encounter (Signed)
Cesalea from [place] calling States that patient was in for visit today and pulse was 50 Would like to know if patient should make changes to propranolol medication or if patient should be scheduled to be seen Please call Cesalea at [phone]

## 2019-05-25 LAB — CANCER ANTIGEN 27.29: CA 27.29: 21.6 U/mL (ref 0.0–38.6)

## 2019-05-25 NOTE — Telephone Encounter (Signed)
Spoke with patient and reviewed recommendations by provider and to use caution with spacing this out and she has always had low heart rates for years despite the propranolol. She states that she has had some swelling to lower legs and reviewed importance of compression hose/socks and leg elevation to try and help reduce that. Recommended that she monitor her symptoms and to please give [place] a call if they persist or worsen. She verbalized understanding with no further questions at this time.

## 2019-06-21 ENCOUNTER — Ambulatory Visit (INDEPENDENT_AMBULATORY_CARE_PROVIDER_SITE_OTHER): Payer: Medicare Other | Admitting: Internal Medicine

## 2019-06-21 ENCOUNTER — Other Ambulatory Visit: Payer: Self-pay

## 2019-06-21 ENCOUNTER — Inpatient Hospital Stay: Payer: Medicare Other | Attending: Hematology and Oncology

## 2019-06-21 ENCOUNTER — Encounter: Payer: Self-pay | Admitting: Internal Medicine

## 2019-06-21 VITALS — BP 170/74 | HR 67 | Temp 96.6°F | Resp 20

## 2019-06-21 VITALS — BP 122/84 | HR 57 | Ht 64.0 in | Wt 201.0 lb

## 2019-06-21 DIAGNOSIS — C7951 Secondary malignant neoplasm of bone: Secondary | ICD-10-CM | POA: Diagnosis not present

## 2019-06-21 DIAGNOSIS — C50412 Malignant neoplasm of upper-outer quadrant of left female breast: Secondary | ICD-10-CM | POA: Diagnosis not present

## 2019-06-21 DIAGNOSIS — Z170 Estrogen receptor positive status [ER+]: Secondary | ICD-10-CM

## 2019-06-21 DIAGNOSIS — E782 Mixed hyperlipidemia: Secondary | ICD-10-CM

## 2019-06-21 DIAGNOSIS — I10 Essential (primary) hypertension: Secondary | ICD-10-CM | POA: Diagnosis not present

## 2019-06-21 DIAGNOSIS — Z5111 Encounter for antineoplastic chemotherapy: Secondary | ICD-10-CM | POA: Insufficient documentation

## 2019-06-21 DIAGNOSIS — I471 Supraventricular tachycardia: Secondary | ICD-10-CM | POA: Diagnosis not present

## 2019-06-21 MED ORDER — FULVESTRANT 250 MG/5ML IM SOLN
500.0000 mg | Freq: Once | INTRAMUSCULAR | Status: AC
  Administered 2019-06-21: 500 mg via INTRAMUSCULAR

## 2019-06-21 NOTE — Patient Instructions (Signed)
Fulvestrant injection What is this medicine? FULVESTRANT (ful VES trant) blocks the effects of estrogen. It is used to treat breast cancer. This medicine may be used for other purposes; ask your health care provider or pharmacist if you have questions. COMMON BRAND NAME(S): FASLODEX What should I tell my health care provider before I take this medicine? They need to know if you have any of these conditions:  bleeding disorders  liver disease  low blood counts, like low white cell, platelet, or red cell counts  an unusual or allergic reaction to fulvestrant, other medicines, foods, dyes, or preservatives  pregnant or trying to get pregnant  breast-feeding How should I use this medicine? This medicine is for injection into a muscle. It is usually given by a health care professional in a hospital or clinic setting. Talk to your pediatrician regarding the use of this medicine in children. Special care may be needed. Overdosage: If you think you have taken too much of this medicine contact a poison control center or emergency room at once. NOTE: This medicine is only for you. Do not share this medicine with others. What if I miss a dose? It is important not to miss your dose. Call your doctor or health care professional if you are unable to keep an appointment. What may interact with this medicine?  medicines that treat or prevent blood clots like warfarin, enoxaparin, dalteparin, apixaban, dabigatran, and rivaroxaban This list may not describe all possible interactions. Give your health care provider a list of all the medicines, herbs, non-prescription drugs, or dietary supplements you use. Also tell them if you smoke, drink alcohol, or use illegal drugs. Some items may interact with your medicine. What should I watch for while using this medicine? Your condition will be monitored carefully while you are receiving this medicine. You will need important blood work done while you are taking  this medicine. Do not become pregnant while taking this medicine or for at least 1 year after stopping it. Women of child-bearing potential will need to have a negative pregnancy test before starting this medicine. Women should inform their doctor if they wish to become pregnant or think they might be pregnant. There is a potential for serious side effects to an unborn child. Men should inform their doctors if they wish to father a child. This medicine may lower sperm counts. Talk to your health care professional or pharmacist for more information. Do not breast-feed an infant while taking this medicine or for 1 year after the last dose. What side effects may I notice from receiving this medicine? Side effects that you should report to your doctor or health care professional as soon as possible:  allergic reactions like skin rash, itching or hives, swelling of the face, lips, or tongue  feeling faint or lightheaded, falls  pain, tingling, numbness, or weakness in the legs  signs and symptoms of infection like fever or chills; cough; flu-like symptoms; sore throat  vaginal bleeding Side effects that usually do not require medical attention (report to your doctor or health care professional if they continue or are bothersome):  aches, pains  constipation  diarrhea  headache  hot flashes  nausea, vomiting  pain at site where injected  stomach pain This list may not describe all possible side effects. Call your doctor for medical advice about side effects. You may report side effects to FDA at 1-800-FDA-1088. Where should I keep my medicine? This drug is given in a hospital or clinic and will   not be stored at home. NOTE: This sheet is a summary. It may not cover all possible information. If you have questions about this medicine, talk to your doctor, pharmacist, or health care provider.  2020 Elsevier/Gold Standard (2018-03-05 11:34:41)  

## 2019-06-21 NOTE — Progress Notes (Signed)
Date:  06/21/2019   Name:  Kelli Williams   DOB:  1926/01/05   MRN:  [id number]   Chief Complaint: Hypertension and Hyperlipidemia Tachycardia - intermittent and has propranolol to take as needed.  Since she has been lightheaded recently, she has stopped it.  HR remains in the high 50's. Hypertension This is a chronic problem. Associated symptoms include headaches and palpitations. Pertinent negatives include no chest pain or shortness of breath. Past treatments include ACE inhibitors and diuretics. The current treatment provides significant improvement.  Hyperlipidemia This is a chronic problem. Pertinent negatives include no chest pain or shortness of breath. Current antihyperlipidemic treatment includes ezetimibe. The current treatment provides mild improvement of lipids.  Metastatic breast cancer Clinically, she continues to do well. Exam reveals no evidence of recurrence. PET scan on 05/20/2019 was personally reviewed.  There is no evidence of progression.  There was some motion artifact. CA27.29 is normal.             Discuss continuation of Faslodex.  Bone metastasis No evidence of active disease on recent PET scan. Delton See remains on permanent hold secondary to dental issues. Continue calcium and vitamin D.   Review of Systems  Constitutional: Positive for fatigue. Negative for chills and fever.  HENT: Positive for dental problem (bad tooth that needs surgery). Negative for trouble swallowing.   Respiratory: Negative for cough, chest tightness, shortness of breath and wheezing.   Cardiovascular: Positive for palpitations and leg swelling. Negative for chest pain.  Gastrointestinal: Negative for abdominal pain, constipation and diarrhea.  Genitourinary: Negative for dysuria and frequency.  Musculoskeletal: Positive for arthralgias and gait problem.  Skin: Negative for rash and wound.  Neurological: Positive for dizziness, light-headedness (at times upon standing) and  headaches. Negative for syncope.  Hematological: Negative for adenopathy.  Psychiatric/Behavioral: Negative for sleep disturbance. The patient is not nervous/anxious.     Patient Active Problem List   Diagnosis Date Noted  . Lumbar pain 04/26/2019  . Dental cavity 08/10/2018  . PAD (peripheral artery disease) ([place]) 07/01/2018  . Aortic atherosclerosis ([place]) 07/01/2018  . Coronary artery calcification seen on CT scan 07/01/2018  . Cancer associated pain 02/05/2018  . Goals of care, counseling/discussion 11/24/2017  . Candida infection 11/24/2017  . Esophageal reflux 10/28/2017  . Postoperative seroma of subcutaneous tissue after non-dermatologic procedure 10/22/2017  . Bone metastases ([place]) 09/23/2017  . Osteopenia 09/08/2017  . Breast cancer of upper-outer quadrant of left female breast ([place]) 07/10/2017  . Irritable bowel syndrome without diarrhea 06/27/2017  . Oral lesion 06/27/2017  . BMI 33.0-33.9,adult 06/27/2017  . OA (osteoarthritis) of knee 10/16/2016  . Leg swelling 10/11/2015  . Neuropathy involving both lower extremities 06/19/2015  . Secondary localized osteoarthrosis, ankle and foot 06/01/2015  . Acne erythematosa 06/01/2015  . Avitaminosis D 06/01/2015  . Atrial tachycardia ([place]) 08/24/2014  . Essential hypertension 08/17/2012  . History of breast cancer 08/17/2012    Allergies  Allergen Reactions  . Amitriptyline Hives  . Amoxicillin     Diarrhea    . Black Pepper [[place]]   . Ivp Dye [Iodinated Diagnostic Agents]   . Tape Other (See Comments)    [place] - please use paper tape.  . Sulfa Antibiotics Rash  . Sulfa Antibiotics Rash    Past Surgical History:  Procedure Laterality Date  . ABDOMINAL HYSTERECTOMY    . AXILLARY LYMPH NODE BIOPSY Left 07/10/2017   INVASIVE MAMMARY CARCINOMA WITH FOCAL LOBULAR FEATURES.   Marland Kitchen BREAST BIOPSY Bilateral  1960   benign  . BREAST LUMPECTOMY Left 2000   breast ca with rad tx  . BREAST LUMPECTOMY Left 08/20/2017  .  BREAST LUMPECTOMY Left 08/20/2017   Procedure: left breast wide excision;  Surgeon: Robert Bellow, MD;  Location: [facility] ORS;  Service: General;  Laterality: Left;  . CATARACT EXTRACTION     left eye  . CHOLECYSTECTOMY    . JOINT REPLACEMENT    . NASAL SEPTUM SURGERY    . PARTIAL KNEE ARTHROPLASTY     right  . TOTAL VAGINAL HYSTERECTOMY      Social History   Tobacco Use  . Smoking status: Never Smoker  . Smokeless tobacco: Never Used  . Tobacco comment: smoking cessation materials not required  Substance Use Topics  . Alcohol use: No    Alcohol/week: 0.0 standard drinks  . Drug use: No     Medication list has been reviewed and updated.  Current Meds  Medication Sig  . acetaminophen (TYLENOL) 325 MG tablet Take 650 mg by mouth every 6 (six) hours as needed.  Marland Kitchen aspirin 81 MG tablet Take 1 tablet by mouth daily.  . Calcium-Magnesium 500-250 MG TABS Take 1 tablet by mouth daily.   . chlorhexidine gluconate, MEDLINE KIT, (PERIDEX) 0.12 % solution Use as directed 5 mLs in the mouth or throat 2 (two) times daily.   . clindamycin (CLEOCIN) 300 MG capsule Take 300 mg by mouth once. Prior to dental appointment  . Coenzyme Q10 (CO Q-10) 100 MG CAPS Take by mouth daily.  . diphenoxylate-atropine (LOMOTIL) 2.5-0.025 MG tablet Take 1 tablet by mouth 4 (four) times daily as needed for diarrhea or loose stools.  . ezetimibe (ZETIA) 10 MG tablet Take 1 tablet (10 mg total) by mouth daily.  Marland Kitchen gabapentin (NEURONTIN) 300 MG capsule TAKE 3 CAPSULES BY MOUTH  TWO TIMES DAILY  . lisinopril-hydrochlorothiazide (PRINZIDE,ZESTORETIC) 20-25 MG tablet Take 1.5 tablets by mouth daily.  . meloxicam (MOBIC) 15 MG tablet TAKE 1 TABLET BY MOUTH  DAILY  . Multiple Vitamins-Minerals (MULTIVITAMIN WITH MINERALS) tablet Take 1 tablet by mouth daily.  Marland Kitchen omeprazole (PRILOSEC) 20 MG capsule TAKE 1 CAPSULE BY MOUTH  DAILY  . propranolol (INDERAL) 10 MG tablet Take 1 tablet (10 mg total) by mouth 3 (three) times  daily as needed.  . thiamine 100 MG tablet Take 1 tablet by mouth daily.  . traMADol (ULTRAM) 50 MG tablet TAKE 1/2 TO 1 TABLET BY  MOUTH EVERY 8 HOURS AS  NEEDED FOR PAIN    PHQ 2/9 Scores 06/21/2019 12/16/2018 06/29/2018 06/27/2017  PHQ - 2 Score 0 0 0 2  PHQ- 9 Score 0 - 0 2    BP Readings from Last 3 Encounters:  06/21/19 122/84  06/21/19 (!) 170/74  05/24/19 (!) 142/70    Physical Exam Constitutional:      Appearance: Normal appearance.  Neck:     Musculoskeletal: Normal range of motion.     Vascular: No carotid bruit.  Cardiovascular:     Rate and Rhythm: Regular rhythm. Bradycardia present.     Pulses: Normal pulses.     Heart sounds: No murmur.  Pulmonary:     Effort: Pulmonary effort is normal.     Breath sounds: No wheezing or rhonchi.  Musculoskeletal:     Right lower leg: Edema present.     Left lower leg: Edema present.  Lymphadenopathy:     Cervical: No cervical adenopathy.  Skin:    General: Skin is warm.  Capillary Refill: Capillary refill takes less than 2 seconds.     Findings: No rash.  Neurological:     Mental Status: She is alert.     Gait: Gait abnormal.  Psychiatric:        Mood and Affect: Mood normal.     Wt Readings from Last 3 Encounters:  06/21/19 201 lb (91.2 kg)  05/24/19 199 lb 6.5 oz (90.5 kg)  04/26/19 202 lb 2.6 oz (91.7 kg)    BP 122/84   Pulse (!) 57   Ht '5\' 4"'$  (1.626 m)   Wt 201 lb (91.2 kg)   SpO2 96%   BMI 34.50 kg/m   Assessment and Plan: 1. Essential hypertension Controlled but mildly sx upon standing - more likely due to insufficient fluid intake, possibly bradycardia Continue ACE/hctz Increase fluid intake Follow up with Cardiology to discuss medication/testing/etc  2. Atrial tachycardia (HCC) Discontinue use of propranolol PRN  3. Malignant neoplasm of upper-outer quadrant of left breast in female, estrogen receptor positive ([place]) Stable, recent PET scan reassuring Unable to get dental work done - need  to consider resuming Xgeva  4. Mixed hyperlipidemia Continue zetia and fish oil - Lipid panel; Future   Partially dictated using Editor, commissioning. Any errors are unintentional.  Halina Maidens, MD [facility]  06/21/2019

## 2019-07-05 ENCOUNTER — Ambulatory Visit: Payer: Medicare Other

## 2019-07-16 NOTE — Progress Notes (Signed)
[facility]  [address] [place], [place] [address] Phone: [phone]  Fax: [phone]   Clinic Day:  07/19/2019  Referring physician: Glean Hess, [facility]  Chief Complaint: Kelli Williams is a 85 y.o. female with metastatic breast cancer who is seen for71monthassessment and continuation of monthlyFaslodex.  HPI: The patient was last seen in the medical oncology clinic on 05/24/2019. At that time, she denied any breast concerns.  Exam was stable.  Pulse was 50.  PET scan revealed no evidence of progression. CA27.29 was normal.  She received Faslodex.  She was seen by her PCP Kelli. BArmy Williams 06/21/2019.  Propranolol was discontinued. She received Faslodex on 06/21/2019.  During the interim, she is doing "okay." Her neuropathy is stable. She continues to have chronic lower extremity edema. She continues to have chronic back and hip pain. Weight is up 16 lbs in the clinic today since 05/24/2019.    Past Medical History:  Diagnosis Date   Anxiety    Arthritis    BOTH FEET AND KNEES   [place] [place]   left breast ca with lumpectomy and rad tx 5 yr tamoxifen/Kelli [facility]  Complication of anesthesia    WOKE UP DURING SURGERY FOR DEVIATED SEPTUM, KNEE REPLACEMENT AND BREAST LUMPECTOMY   Dysrhythmia    GERD (gastroesophageal reflux disease)    Hypertension    Neuropathic pain of both legs    Neuropathy    Pneumonia 2016   Skin cancer of forehead    Skin cancer of nose     Past Surgical History:  Procedure Laterality Date   ABDOMINAL HYSTERECTOMY     AXILLARY LYMPH NODE BIOPSY Left 07/10/2017   INVASIVE MAMMARY CARCINOMA WITH FOCAL LOBULAR FEATURES.    BREAST BIOPSY Bilateral 1960   benign   BREAST LUMPECTOMY Left 2000   breast ca with rad tx   BREAST LUMPECTOMY Left 08/20/2017   BREAST LUMPECTOMY Left 08/20/2017   Procedure: left breast wide excision;  Surgeon: K[facility];  Location:  [facility] ORS;  Service: General;  Laterality: Left;   CATARACT EXTRACTION     left eye   CHOLECYSTECTOMY     JOINT REPLACEMENT     NASAL SEPTUM SURGERY     PARTIAL KNEE ARTHROPLASTY     right   TOTAL VAGINAL HYSTERECTOMY      Family History  Problem Relation Age of Onset   Heart disease Mother    Heart attack Paternal Uncle    Breast cancer Daughter 495      genetic negative   Cancer Paternal Grandmother     Social History:  reports that she has never smoked. She has never used smokeless tobacco. She reports that she does not drink alcohol or use drugs.The patient's husband died 2 years ago.[place] She is independent except for the use of her walker. Patient has a daughter, Kelli [facility] The patient is alone today.  Allergies:  Allergies  Allergen Reactions   Amitriptyline Hives   Amoxicillin     Diarrhea     Black Pepper [[place]]    Ivp Dye [Iodinated Diagnostic Agents]    Tape Other (See Comments)    Whelps - please use paper tape.   Sulfa Antibiotics Rash   Sulfa Antibiotics Rash    Current Medications: Current Outpatient Medications  Medication Sig Dispense Refill  acetaminophen (TYLENOL) 325 MG tablet Take 650 mg by mouth every 6 (six) hours as needed.     aspirin 81 MG tablet Take 1 tablet by mouth daily.     Calcium-Magnesium 500-250 MG TABS Take 1 tablet by mouth daily.      chlorhexidine gluconate, MEDLINE KIT, (PERIDEX) 0.12 % solution Use as directed 5 mLs in the mouth or throat 2 (two) times daily.      clindamycin (CLEOCIN) 300 MG capsule Take 300 mg by mouth once. Prior to dental appointment     Coenzyme Q10 (CO Q-10) 100 MG CAPS Take 1 tablet by mouth daily.      diphenoxylate-atropine (LOMOTIL) 2.5-0.025 MG tablet Take 1 tablet by mouth 4 (four) times daily as needed for diarrhea or loose stools. 30 tablet 0    ezetimibe (ZETIA) 10 MG tablet Take 1 tablet (10 mg total) by mouth daily. 90 tablet 3   gabapentin (NEURONTIN) 300 MG capsule TAKE 3 CAPSULES BY MOUTH  TWO TIMES DAILY 540 capsule 1   lisinopril-hydrochlorothiazide (PRINZIDE,ZESTORETIC) 20-25 MG tablet Take 1.5 tablets by mouth daily. 135 tablet 3   meloxicam (MOBIC) 15 MG tablet TAKE 1 TABLET BY MOUTH  DAILY 90 tablet 1   Multiple Vitamins-Minerals (MULTIVITAMIN WITH MINERALS) tablet Take 1 tablet by mouth daily.     omeprazole (PRILOSEC) 20 MG capsule TAKE 1 CAPSULE BY MOUTH  DAILY 90 capsule 1   traMADol (ULTRAM) 50 MG tablet TAKE 1/2 TO 1 TABLET BY  MOUTH EVERY 8 HOURS AS  NEEDED FOR PAIN 90 tablet 0   No current facility-administered medications for this visit.     Review of Systems  Constitutional: Negative for chills, diaphoresis, fever, malaise/fatigue and weight loss (up 16lbs).       Feels "fine".  HENT: Negative for congestion, ear pain, nosebleeds, sinus pain and sore throat.        Dental issues (chronic).  Eyes: Negative.  Negative for blurred vision, double vision, photophobia and pain.  Respiratory: Negative.  Negative for cough, hemoptysis, sputum production and shortness of breath.   Cardiovascular: Positive for leg swelling (chronic). Negative for chest pain, palpitations and PND.  Gastrointestinal: Negative.  Negative for abdominal pain, blood in stool, constipation, diarrhea, melena, nausea and vomiting.  Genitourinary: Negative.  Negative for dysuria, frequency, hematuria and urgency.  Musculoskeletal: Positive for back pain (chronic) and joint pain (chronic LEFT knee "bone on bone" and BILATERAL hip pain). Negative for falls, myalgias and neck pain.  Skin: Negative.  Negative for itching and rash.  Neurological: Positive for sensory change (stable neuropathy on gabapentin). Negative for tremors, speech change, focal weakness, weakness and headaches.       Chronic balance issues.  Endo/Heme/Allergies: Does not  bruise/bleed easily.       Allergy to pepper.  Psychiatric/Behavioral: Negative.  Negative for depression and memory loss. The patient is not nervous/anxious and does not have insomnia.   All other systems reviewed and are negative.  Performance status (ECOG):  2  Vitals Blood pressure (!) 147/71, pulse (!) 57, temperature 97.8 F (36.6 C), temperature source Tympanic, resp. rate 18, weight 215 lb 15.1 oz (98 kg), SpO2 98 %.   Physical Exam  Constitutional: She is oriented to person, place, and time. Vital signs are normal. She appears well-developed and well-nourished. No distress.  Elderly woman sitting comfortably in the exam room in no acute distress.  She has a rolling walker at her side.  She is examined in her chair.  HENT:  Head: Normocephalic and atraumatic.  Mouth/Throat: Oropharynx is clear and moist. No oropharyngeal exudate.  Long gray hair.  Wearing mask.  Eyes: Pupils are equal, round, and reactive to light. Conjunctivae and EOM are normal. No scleral icterus.  Glasses.   Neck: Normal range of motion. Neck supple. No JVD present.  Cardiovascular: Normal rate, regular rhythm and normal heart sounds. Exam reveals no gallop.  No murmur heard. Pulmonary/Chest: Effort normal and breath sounds normal. No respiratory distress. She has no wheezes. She has no rales. She exhibits no tenderness.  Abdominal: Soft. Normal appearance and bowel sounds are normal. She exhibits no distension and no mass. There is no abdominal tenderness. There is no rebound and no guarding.  Musculoskeletal:        General: Edema (chronic lower extremity; left > right) present.     Thoracic back: She exhibits tenderness and bony tenderness.     Lumbar back: She exhibits tenderness and bony tenderness.  Lymphadenopathy:    She has no cervical adenopathy.    She has no axillary adenopathy.       Right: No supraclavicular adenopathy present.       Left: No supraclavicular adenopathy present.    Neurological: She is alert and oriented to person, place, and time.  Skin: Skin is warm and dry. No rash noted. She is not diaphoretic. No erythema. There is pallor.  Psychiatric: She has a normal mood and affect. Her behavior is normal. Judgment and thought content normal.  Nursing note and vitals reviewed.   Imaging studies: 09/16/2017: PET scanrevealed widespread scattered hypermetabolic lytic osseous metastasesthroughout the axial and proximal appendicular skeleton. Bilateral proximal femoral skeletal metastases placed the patient at risk for pathologic hip fractures. There was mild hypermetabolic right hilar lymphadenopathy.  09/23/2017: Plain films of the bilateral femursrevealed no pathologic fracture. The LEFT view showed lytic subtrochanteric metastasis that was previously seen on PET scan.  06/01/2018: PET scandemonstrated marked improvementin the osseous metastatic disease. Previously demonstrated areas of abnormally high metabolic activity have resolved, with the exception of a very faint area of residual activity (SUV 2.6; previously 11.5) in the LEFT subtrochanteric region. Previous LEFT axillary fluid collection now shows a 1.8 x 1.2 cm scarlike density with low-grade activity (SUV 2.7). Incidental findings include a small hiatal hernia and diverticulosis of the sigmoid colon.  02/11/2018: Lumbar spine MRI revealed multifocal lumbosacral vertebral body signal abnormalities consistent with osseous metastatic disease. The largest lesion was at L1. There was no pathologic compression fracture. There was multi-level moderate to severe neural foraminal stenosis, which had progressed since the prior study. 09/26/2018: Sacral MRI revealed scattered small marrow lesionsc/wmetastatic disease.The0.6 cm lesion in the S1 vertebral bodywasnew since the comparison lumbar spine MRI. Small lesion in the left ilium adjacent to the SI joint was present on the prior MRI. The remaining  lesions werenot included on the prior MRI. There was nofracture or other acute abnormality.There was lower lumbar spondylosisand diverticulosis. 10/15/2018: PET scanrevealed no evidence of active breast cancer metastasis on FDG PET scan. There were stable lytic lesions in the spine without evidence of metabolic activity consistent treated metastasis. 05/20/2019:  PET scan revealed no evidence of breast cancer recurrence or progression. There were stable lytic lesions in the spine without associated metabolic activity.  CT data was degraded by patient motion.   Appointment on 07/19/2019  Component Date Value Ref Range Status   Sodium 07/19/2019 137  135 - 145 mmol/L Final   Potassium 07/19/2019 3.8  3.5 - 5.1 mmol/L  Final   Chloride 07/19/2019 101  98 - 111 mmol/L Final   CO2 07/19/2019 26  22 - 32 mmol/L Final   Glucose, Bld 07/19/2019 102* 70 - 99 mg/dL Final   BUN 07/19/2019 26* 8 - 23 mg/dL Final   Creatinine, Ser 07/19/2019 0.92  0.44 - 1.00 mg/dL Final   Calcium 07/19/2019 9.7  8.9 - 10.3 mg/dL Final   Total Protein 07/19/2019 7.1  6.5 - 8.1 g/dL Final   Albumin 07/19/2019 3.9  3.5 - 5.0 g/dL Final   AST 07/19/2019 20  15 - 41 U/L Final   ALT 07/19/2019 13  0 - 44 U/L Final   Alkaline Phosphatase 07/19/2019 81  38 - 126 U/L Final   Total Bilirubin 07/19/2019 0.9  0.3 - 1.2 mg/dL Final   GFR calc non Af Amer 07/19/2019 54* >60 mL/min Final   GFR calc Af Amer 07/19/2019 >60  >60 mL/min Final   Anion gap 07/19/2019 10  5 - 15 Final   Performed at [facility], [address], [place], [place] [address]   WBC 07/19/2019 5.5  4.0 - 10.5 K/uL Final   RBC 07/19/2019 4.17  3.87 - 5.11 MIL/uL Final   Hemoglobin 07/19/2019 12.2  12.0 - 15.0 g/dL Final   HCT 07/19/2019 36.2  36.0 - 46.0 % Final   MCV 07/19/2019 86.8  80.0 - 100.0 fL Final   MCH 07/19/2019 29.3  26.0 - 34.0 pg Final   MCHC 07/19/2019 33.7  30.0 - 36.0 g/dL Final   RDW 07/19/2019  14.7  11.5 - 15.5 % Final   Platelets 07/19/2019 203  150 - 400 K/uL Final   nRBC 07/19/2019 0.0  0.0 - 0.2 % Final   Neutrophils Relative % 07/19/2019 62  % Final   Neutro Abs 07/19/2019 3.3  1.7 - 7.7 K/uL Final   Lymphocytes Relative 07/19/2019 20  % Final   Lymphs Abs 07/19/2019 1.1  0.7 - 4.0 K/uL Final   Monocytes Relative 07/19/2019 9  % Final   Monocytes Absolute 07/19/2019 0.5  0.1 - 1.0 K/uL Final   Eosinophils Relative 07/19/2019 8  % Final   Eosinophils Absolute 07/19/2019 0.5  0.0 - 0.5 K/uL Final   Basophils Relative 07/19/2019 1  % Final   Basophils Absolute 07/19/2019 0.1  0.0 - 0.1 K/uL Final   Immature Granulocytes 07/19/2019 0  % Final   Abs Immature Granulocytes 07/19/2019 0.02  0.00 - 0.07 K/uL Final   Performed at [facility], [address] [place], [place] [address]    Assessment:  Kelli [facility] is a 85 y.o. female with metastatic breast cancer s/p left axillary biopsy on 07/10/2017. Pathology revealed a 1.1 cm grade II invasive mammary carcinoma with focal lobular features. Wide excisionon 08/20/2017 revealed metastatic mammary carcinoma involving 1 of 4 axillary lymph nodes with extracapsular extension. The superior medial margin was involved. Tumor was ER + (>90%), PR + (>90%), and Her2/neu 2+ (FISH -).   Inv[facility]ae genetic testingon 09/26/2017 revealed no pathogenic sequence variants or deletions/duplications. BRCA1/2 testing on 09/26/2017 was negative.  She has a history of left breast cancers/p wide excisionand sentinel lymph node biopsy on 07/18/1999 at [facility]. Pathology revealed a 0.8 x 0.6 x 0.4 cm grade II invasive ductal adenocarcinoma. There was no lymphovascular invasion. 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 Margins were negative. Tumor was ER + (50%) and PR + (90%). Pathologic stagewas T1bN0. She received 6200 cGyfrom 08/21/1999 - 10/03/1999.  She completed 5 years of tamoxifenin 06/2004.    PET scanon 09/16/2017 revealed widespread scattered hypermetabolic lytic osseous metastases throughout the axial and proximal appendicular skeleton. Bilateral proximal femoral skeletal metastases placed the patient at risk for pathologic hip fractures. There was mild hypermetabolic right hilar lymphadenopathy. Plain films of the bilateral femurson 09/23/2017 revealed no pathologic fracture. The LEFT view showed lytic subtrochanteric metastasis that was previously seen on PET scan.   PET scan on 05/20/2019 revealed no evidence of breast cancer recurrence or progression. There were stable lytic lesions in the spine without associated metabolic activity.  CT data was degraded by patient motion.  CA27.29has been followed: 14.7 on 02/05/2017, 19.1 on 02/06/2012, 172.2 on 09/04/2017, 144.9 on 11/06/2017, 73.4 on 02/05/2018, 63.3 on 03/06/2018, 47.2 on 04/06/2018, 42.4 on 05/07/2018, 36.6 on 06/08/2018, 34.6 on 07/06/2018, 27.1 on 08/06/2018, 28.2 on 09/10/2018, 26.0 on 10/09/2018, 30.9 on 11/09/2018, 27.7 on 12/07/2018, 35.8 on 01/04/2019, 24.8 on 02/01/2019, 24.2 on 04/26/2019, 21.6 on 05/24/2019, and 24.5 on 07/19/2019.  She received a palliative course of radiation(3000 cGy in 10 fractions) to the left hip from 10/14/2017 - 10/27/2017. She received a course of palliative radiationthe the lumbar spine and SI joints from 03/04/2018 - 03/27/2018.  She began Faslodexon 09/26/2017 (last 06/21/2019). She began monthly Xgevaon 09/26/2017 (last 05/07/2018). She has dental issues.  Bone densityon 09/01/2017 revealed osteopeniawith a T-score of -1.9 in the right femoral neck and left forearm. She is on calcium and vitamin D.  Symptomatically, she is doing well.  She notes no concerns except for weight gain.  Exam is stable.  Plan: 1.   Labs today:CBC with diff, CMP, CA 27.29.  2.   Metastatic breast cancer Clinically, she is doing well.   Exam is stable.   CA 27.29 is normal.    PET scan on 05/20/2019 revealed no evidence of progression. Continue monthly Faslodex. 3.   Bone metastasis Patient has no evidence of active disease on PET scan from 05/20/2019.   Delton See remains on permanent hold secondary to chronic dental issues.   4.   Osteopenia Next bone density due 09/02/2019 (coordinate with Faslodex injection). Continue calcium and vitamin D. 5.   Dental issues Patient has not followed up with dentistry secondary to concerns about COVID-19.   Patient to follow-up with dentist once COVID-19 restrictions are lifted. 6.   Pepper allergy She notes a significant allergy to pepper at the [place].   She states her daughter brings and frozen food for her to eat. 7.   Faslodex today.   8.   RTC monthly x2 for Faslodex.   9.   RTC in 3 months for Williams assessment, labs (CBC with diff, CMP, CA 27.29) and Faslodex.  I discussed the assessment and treatment plan with the patient.  The patient was provided an opportunity to ask questions and all were answered.  The patient agreed with the plan and demonstrated an understanding of the instructions.  The patient was advised to call back if the symptoms worsen or if the condition fails to improve as anticipated.   Lequita Asal, MD, PhD    07/19/2019, 10:46 AM  I, Molly Dorshimer, am acting as Education administrator for [employer]. Mike Gip, MD, PhD.  I, Tangala Wiegert C. Mike Gip, [facility], have reviewed the above documentation for accuracy and completeness, and I agree with the above.

## 2019-07-19 ENCOUNTER — Inpatient Hospital Stay: Payer: Medicare Other

## 2019-07-19 ENCOUNTER — Inpatient Hospital Stay: Payer: Medicare Other | Attending: Hematology and Oncology | Admitting: Hematology and Oncology

## 2019-07-19 ENCOUNTER — Other Ambulatory Visit: Payer: Self-pay

## 2019-07-19 ENCOUNTER — Encounter: Payer: Self-pay | Admitting: Hematology and Oncology

## 2019-07-19 VITALS — BP 147/71 | HR 57 | Temp 97.8°F | Resp 18 | Wt 215.9 lb

## 2019-07-19 DIAGNOSIS — E782 Mixed hyperlipidemia: Secondary | ICD-10-CM

## 2019-07-19 DIAGNOSIS — Z85828 Personal history of other malignant neoplasm of skin: Secondary | ICD-10-CM | POA: Insufficient documentation

## 2019-07-19 DIAGNOSIS — F419 Anxiety disorder, unspecified: Secondary | ICD-10-CM | POA: Diagnosis not present

## 2019-07-19 DIAGNOSIS — Z90710 Acquired absence of both cervix and uterus: Secondary | ICD-10-CM | POA: Insufficient documentation

## 2019-07-19 DIAGNOSIS — Z79899 Other long term (current) drug therapy: Secondary | ICD-10-CM | POA: Diagnosis not present

## 2019-07-19 DIAGNOSIS — C50912 Malignant neoplasm of unspecified site of left female breast: Secondary | ICD-10-CM | POA: Diagnosis not present

## 2019-07-19 DIAGNOSIS — M8580 Other specified disorders of bone density and structure, unspecified site: Secondary | ICD-10-CM | POA: Insufficient documentation

## 2019-07-19 DIAGNOSIS — M85851 Other specified disorders of bone density and structure, right thigh: Secondary | ICD-10-CM

## 2019-07-19 DIAGNOSIS — I10 Essential (primary) hypertension: Secondary | ICD-10-CM | POA: Insufficient documentation

## 2019-07-19 DIAGNOSIS — G629 Polyneuropathy, unspecified: Secondary | ICD-10-CM | POA: Diagnosis not present

## 2019-07-19 DIAGNOSIS — Z803 Family history of malignant neoplasm of breast: Secondary | ICD-10-CM | POA: Insufficient documentation

## 2019-07-19 DIAGNOSIS — Z791 Long term (current) use of non-steroidal anti-inflammatories (NSAID): Secondary | ICD-10-CM | POA: Diagnosis not present

## 2019-07-19 DIAGNOSIS — C50412 Malignant neoplasm of upper-outer quadrant of left female breast: Secondary | ICD-10-CM

## 2019-07-19 DIAGNOSIS — C773 Secondary and unspecified malignant neoplasm of axilla and upper limb lymph nodes: Secondary | ICD-10-CM | POA: Diagnosis not present

## 2019-07-19 DIAGNOSIS — C7951 Secondary malignant neoplasm of bone: Secondary | ICD-10-CM | POA: Diagnosis not present

## 2019-07-19 DIAGNOSIS — Z170 Estrogen receptor positive status [ER+]: Secondary | ICD-10-CM

## 2019-07-19 DIAGNOSIS — Z7982 Long term (current) use of aspirin: Secondary | ICD-10-CM | POA: Diagnosis not present

## 2019-07-19 LAB — CBC WITH DIFFERENTIAL/PLATELET
Abs Immature Granulocytes: 0.02 10*3/uL (ref 0.00–0.07)
Basophils Absolute: 0.1 10*3/uL (ref 0.0–0.1)
Basophils Relative: 1 %
Eosinophils Absolute: 0.5 10*3/uL (ref 0.0–0.5)
Eosinophils Relative: 8 %
HCT: 36.2 % (ref 36.0–46.0)
Hemoglobin: 12.2 g/dL (ref 12.0–15.0)
Immature Granulocytes: 0 %
Lymphocytes Relative: 20 %
Lymphs Abs: 1.1 10*3/uL (ref 0.7–4.0)
MCH: 29.3 pg (ref 26.0–34.0)
MCHC: 33.7 g/dL (ref 30.0–36.0)
MCV: 86.8 fL (ref 80.0–100.0)
Monocytes Absolute: 0.5 10*3/uL (ref 0.1–1.0)
Monocytes Relative: 9 %
Neutro Abs: 3.3 10*3/uL (ref 1.7–7.7)
Neutrophils Relative %: 62 %
Platelets: 203 10*3/uL (ref 150–400)
RBC: 4.17 MIL/uL (ref 3.87–5.11)
RDW: 14.7 % (ref 11.5–15.5)
WBC: 5.5 10*3/uL (ref 4.0–10.5)
nRBC: 0 % (ref 0.0–0.2)

## 2019-07-19 LAB — COMPREHENSIVE METABOLIC PANEL
ALT: 13 U/L (ref 0–44)
AST: 20 U/L (ref 15–41)
Albumin: 3.9 g/dL (ref 3.5–5.0)
Alkaline Phosphatase: 81 U/L (ref 38–126)
Anion gap: 10 (ref 5–15)
BUN: 26 mg/dL — ABNORMAL HIGH (ref 8–23)
CO2: 26 mmol/L (ref 22–32)
Calcium: 9.7 mg/dL (ref 8.9–10.3)
Chloride: 101 mmol/L (ref 98–111)
Creatinine, Ser: 0.92 mg/dL (ref 0.44–1.00)
GFR calc Af Amer: >60 mL/min (ref >60)
GFR calc non Af Amer: 54 mL/min — ABNORMAL LOW (ref >60)
Glucose, Bld: 102 mg/dL — ABNORMAL HIGH (ref 70–99)
Potassium: 3.8 mmol/L (ref 3.5–5.1)
Sodium: 137 mmol/L (ref 135–145)
Total Bilirubin: 0.9 mg/dL (ref 0.3–1.2)
Total Protein: 7.1 g/dL (ref 6.5–8.1)

## 2019-07-19 MED ORDER — FULVESTRANT 250 MG/5ML IM SOLN
500.0000 mg | Freq: Once | INTRAMUSCULAR | Status: AC
  Administered 2019-07-19: 500 mg via INTRAMUSCULAR

## 2019-07-19 NOTE — Progress Notes (Signed)
Pt here for follow up. Denies any concerns at this time.  

## 2019-07-20 LAB — CANCER ANTIGEN 27.29: CA 27.29: 24.5 U/mL (ref 0.0–38.6)

## 2019-07-21 ENCOUNTER — Other Ambulatory Visit: Payer: Self-pay | Admitting: Cardiovascular Disease

## 2019-07-21 ENCOUNTER — Other Ambulatory Visit: Payer: Self-pay | Admitting: Internal Medicine

## 2019-07-21 DIAGNOSIS — I10 Essential (primary) hypertension: Secondary | ICD-10-CM

## 2019-07-21 DIAGNOSIS — G5793 Unspecified mononeuropathy of bilateral lower limbs: Secondary | ICD-10-CM

## 2019-07-21 DIAGNOSIS — K589 Irritable bowel syndrome without diarrhea: Secondary | ICD-10-CM

## 2019-07-21 NOTE — Telephone Encounter (Signed)
Patient needs an appointment for further refills. If patient does not want to schedule an appointment please make them aware to contact PCP for refills. Thanks Ladies!  

## 2019-08-02 ENCOUNTER — Other Ambulatory Visit: Payer: Self-pay

## 2019-08-02 DIAGNOSIS — M170 Bilateral primary osteoarthritis of knee: Secondary | ICD-10-CM

## 2019-08-02 DIAGNOSIS — E785 Hyperlipidemia, unspecified: Secondary | ICD-10-CM

## 2019-08-02 DIAGNOSIS — I2510 Atherosclerotic heart disease of native coronary artery without angina pectoris: Secondary | ICD-10-CM

## 2019-08-02 NOTE — Progress Notes (Signed)
lipi

## 2019-08-03 ENCOUNTER — Telehealth: Payer: Self-pay

## 2019-08-03 NOTE — Telephone Encounter (Signed)
Returned patient's call regarding clearance letter for dental work. Informed her , per Dr. Mike Gip, she does not need a clearance letter because she is not getting Xgeva. Patient then requested for refill on antibiotic prior to getting dental work. Informed her she would need to reach out to PCP for this. Patient verbalizes understanding and denies any further questions or concerns.

## 2019-08-03 NOTE — Telephone Encounter (Signed)
-----   Message from Lequita Asal, MD sent at 08/02/2019  5:38 PM EDT ----- Regarding: RE: tooth pain Contact: [phone]   Please call the patient.  I am unsure about a "clearance letter". We have not given her Delton See because she has had ongoing dental issues.  M ----- Message ----- From: Secundino Ginger Sent: 08/02/2019   4:20 PM EDT To: Arlan Organ, RN, # Subject: tooth pain                                     This patient called and has terrible tooth pain and Dentist said she needs a clearance letter. Please call patient

## 2019-08-13 ENCOUNTER — Other Ambulatory Visit: Payer: Self-pay

## 2019-08-17 ENCOUNTER — Inpatient Hospital Stay: Payer: Medicare Other | Attending: Hematology and Oncology

## 2019-08-17 ENCOUNTER — Other Ambulatory Visit: Payer: Self-pay

## 2019-08-17 DIAGNOSIS — Z170 Estrogen receptor positive status [ER+]: Secondary | ICD-10-CM | POA: Insufficient documentation

## 2019-08-17 DIAGNOSIS — C50412 Malignant neoplasm of upper-outer quadrant of left female breast: Secondary | ICD-10-CM | POA: Insufficient documentation

## 2019-08-17 DIAGNOSIS — C7951 Secondary malignant neoplasm of bone: Secondary | ICD-10-CM | POA: Diagnosis not present

## 2019-08-17 DIAGNOSIS — Z5111 Encounter for antineoplastic chemotherapy: Secondary | ICD-10-CM | POA: Insufficient documentation

## 2019-08-17 MED ORDER — FULVESTRANT 250 MG/5ML IM SOLN
500.0000 mg | Freq: Once | INTRAMUSCULAR | Status: AC
  Administered 2019-08-17: 500 mg via INTRAMUSCULAR

## 2019-09-04 NOTE — Progress Notes (Signed)
Cardiology Office Note  Date:  09/07/2019   ID:  Kelli Williams, DOB 1926/09/22, MRN [id number]  PCP:  Glean Hess, MD   Chief Complaint  Patient presents with  . Other    12 month follow up. Patient c/o swelling in feet and ankles.  Patient denies chest pain and SOB. Meds reviewed verbally with patient.     HPI:  [name] is a very pleasant 85 year old woman  with no known coronary artery disease or cardiac issues  Status post radiation treatment for breast cancer in 2000 on the left with 7 weeks of radiation on a daily basis.  She lost her husband in March 2016. Still with significant adjustment disorder/depression last echo 2011: normal EF 60%, who presents for routine followup of her tachycardia/palpitations, breast cancer,   Feels well, Rare palpitations Off propranolol for low heart rate At home rate low 50s, chronic issue  Recent pet scan No cancer recurrence  Labs reviewed BUN 26  Leg swelling, chronic stable  Denies any significant shortness of breath or chest pain on exertion Does have some weakness in her legs, joint discomfort, ore muscles Otherwise feels relatively well  EKG personally reviewed by myself on todays visit Shows normal sinus rhythm rate 51 bpm    Other past medical hx reviewed  lymph node under left arm resected, tested positive for breast cancer 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 Moderate cardiomegaly.  at least mild to moderate diffuse aortic atherosclerosis extending up to the carotids, some degree of coronary calcification/difficult to determine secondary to motion artifact  On denosumab for cancer   PMH:   has a past medical history of Anxiety, Arthritis, Breast cancer ([place])  (7035), Complication of anesthesia, Dysrhythmia, GERD (gastroesophageal reflux disease), Hypertension, Neuropathic pain of both legs, Neuropathy, Pneumonia (2016), Skin cancer of forehead, and Skin cancer of nose.  PSH:    Past Surgical History:  Procedure Laterality Date  . ABDOMINAL HYSTERECTOMY    . AXILLARY LYMPH NODE BIOPSY Left 07/10/2017   INVASIVE MAMMARY CARCINOMA WITH FOCAL LOBULAR FEATURES.   Marland Kitchen BREAST BIOPSY Bilateral 1960   benign  . BREAST LUMPECTOMY Left 2000   breast ca with rad tx  . BREAST LUMPECTOMY Left 08/20/2017  . BREAST LUMPECTOMY Left 08/20/2017   Procedure: left breast wide excision;  Surgeon: Robert Bellow, MD;  Location: [facility] ORS;  Service: General;  Laterality: Left;  . CATARACT EXTRACTION     left eye  . CHOLECYSTECTOMY    . JOINT REPLACEMENT    . NASAL SEPTUM SURGERY    . PARTIAL KNEE ARTHROPLASTY     right  . TOTAL VAGINAL HYSTERECTOMY      Current Outpatient Medications  Medication Sig Dispense Refill  . acetaminophen (TYLENOL) 325 MG tablet Take 650 mg by mouth every 6 (six) hours as needed.    Marland Kitchen aspirin 81 MG tablet Take 1 tablet by mouth daily.    . Calcium-Magnesium 500-250 MG TABS Take 1 tablet by mouth daily.     . chlorhexidine gluconate, MEDLINE KIT, (PERIDEX) 0.12 % solution Use as directed 5 mLs in the mouth or throat 2 (two) times daily.     . clindamycin (CLEOCIN) 300 MG capsule Take 300 mg by mouth once. Prior to dental appointment    .  Coenzyme Q10 (CO Q-10) 100 MG CAPS Take 1 tablet by mouth daily.     . diphenoxylate-atropine (LOMOTIL) 2.5-0.025 MG tablet Take 1 tablet by mouth 4 (four) times daily as needed for diarrhea or loose stools. 30 tablet 0  . ezetimibe (ZETIA) 10 MG tablet TAKE 1 TABLET BY MOUTH  DAILY 90 tablet 0  . gabapentin (NEURONTIN) 300 MG capsule TAKE 3 CAPSULES BY MOUTH  TWO TIMES DAILY 540 capsule 1  . lisinopril-hydrochlorothiazide (ZESTORETIC) 20-25 MG tablet TAKE 1 AND 1/2 TABLETS BY  MOUTH DAILY 135 tablet  3  . meloxicam (MOBIC) 15 MG tablet TAKE 1 TABLET BY MOUTH  DAILY 90 tablet 3  . Multiple Vitamins-Minerals (MULTIVITAMIN WITH MINERALS) tablet Take 1 tablet by mouth daily.    Marland Kitchen omeprazole (PRILOSEC) 20 MG capsule TAKE 1 CAPSULE BY MOUTH  DAILY 90 capsule 3  . traMADol (ULTRAM) 50 MG tablet TAKE 1/2 TO 1 TABLET BY  MOUTH EVERY 8 HOURS AS  NEEDED FOR PAIN 90 tablet 0   No current facility-administered medications for this visit.      Allergies:   Amitriptyline, Amoxicillin, Black pepper [piper], Ivp dye [iodinated diagnostic agents], Tape, Sulfa antibiotics, and Sulfa antibiotics   Social History:  The patient  reports that she has never smoked. She has never used smokeless tobacco. She reports that she does not drink alcohol or use drugs.   Family History:   family history includes Breast cancer (age of onset: 75) in her daughter; Cancer in her paternal grandmother; Heart attack in her paternal uncle; Heart disease in her mother.    Review of Systems: Review of Systems  HENT: Negative.   Respiratory: Negative.   Cardiovascular: Positive for leg swelling.  Gastrointestinal: Negative.   Musculoskeletal: Positive for joint pain.       Difficulty walking  Neurological: Negative.   Psychiatric/Behavioral: Negative.   All other systems reviewed and are negative.   PHYSICAL EXAM: VS:  BP (!) 144/66 (BP Location: Right Arm, Patient Position: Sitting, Cuff Size: Normal)   Pulse (!) 51   Ht '5\' 4"'$  (1.626 m)   Wt 200 lb 12 oz (91.1 kg)   BMI 34.46 kg/m  , BMI Body mass index is 34.46 kg/m. Constitutional:  oriented to person, place, and time. No distress.  HENT:  Head: Grossly normal Eyes:  no discharge. No scleral icterus.  Neck: No JVD, no carotid bruits  Cardiovascular: Regular rate and rhythm, no murmurs appreciated Trace ankle swelling,minimal pitting Pulmonary/Chest: Clear to auscultation bilaterally, no wheezes or rails Abdominal: Soft.  no distension.  no tenderness.   Musculoskeletal: Normal range of motion Neurological:  normal muscle tone. Coordination normal. No atrophy Skin: Skin warm and dry Psychiatric: normal affect, pleasant   Recent Labs: 03/01/2019: TSH 1.606 07/19/2019: ALT 13; BUN 26; Creatinine, Ser 0.92; Hemoglobin 12.2; Platelets 203; Potassium 3.8; Sodium 137    Lipid Panel Lab Results  Component Value Date   CHOL 140 12/07/2018   HDL 50 12/07/2018   LDLCALC 71 12/07/2018   TRIG 93 12/07/2018      Wt Readings from Last 3 Encounters:  09/07/19 200 lb 12 oz (91.1 kg)  07/19/19 215 lb 15.1 oz (98 kg)  06/21/19 201 lb (91.2 kg)      ASSESSMENT AND PLAN:  Essential hypertension - Plan: EKG 12-Lead Blood pressure is well controlled on today's visit. No changes made to the medications. stable  Atrial tachycardia ([place]) - Plan: EKG 12-Lead Denies sx, off b-blocker secondary to bradycrdia  Adjustment disorder with other symptom - Plan: EKG 12-Lead loss of her husband Over cancer, Doing better, stable  Leg swelling - Plan: EKG 12-Lead Minimal swelling on today's visit, mild venous insufficiency  Breast cancer On chemotherapy medication has completed XRT Improvement in her metastases to bone Recent pet scan reviewed  Aortic atherosclerosis Mild-to-moderate in nature  lipid management  would avoid statins given her Leg discomfort Continue  Zetia 10 mg daily  Coronary calcification Seen on CT scan Also aortic athero LDL at goal 11/2018 Lipids today    Total encounter time more than 25 minutes  Greater than 50% was spent in counseling and coordination of care with the patient  Disposition:   F/U  12 months   Orders Placed This Encounter  Procedures  . EKG 12-Lead     Signed, Esmond Plants, M.D., Ph.D. 09/07/2019  [place], [place]

## 2019-09-07 ENCOUNTER — Encounter: Payer: Self-pay | Admitting: Cardiovascular Disease

## 2019-09-07 ENCOUNTER — Other Ambulatory Visit: Payer: Self-pay

## 2019-09-07 ENCOUNTER — Ambulatory Visit (INDEPENDENT_AMBULATORY_CARE_PROVIDER_SITE_OTHER): Payer: Medicare Other | Admitting: Cardiovascular Disease

## 2019-09-07 VITALS — BP 144/66 | HR 51 | Ht 64.0 in | Wt 200.8 lb

## 2019-09-07 DIAGNOSIS — I471 Supraventricular tachycardia: Secondary | ICD-10-CM

## 2019-09-07 DIAGNOSIS — C7951 Secondary malignant neoplasm of bone: Secondary | ICD-10-CM

## 2019-09-07 DIAGNOSIS — I739 Peripheral vascular disease, unspecified: Secondary | ICD-10-CM

## 2019-09-07 DIAGNOSIS — I10 Essential (primary) hypertension: Secondary | ICD-10-CM

## 2019-09-07 DIAGNOSIS — I2510 Atherosclerotic heart disease of native coronary artery without angina pectoris: Secondary | ICD-10-CM | POA: Diagnosis not present

## 2019-09-07 DIAGNOSIS — Z13220 Encounter for screening for lipoid disorders: Secondary | ICD-10-CM

## 2019-09-07 DIAGNOSIS — M7989 Other specified soft tissue disorders: Secondary | ICD-10-CM

## 2019-09-07 DIAGNOSIS — I700 Atherosclerosis of aorta: Secondary | ICD-10-CM

## 2019-09-07 NOTE — Patient Instructions (Signed)
Lipid panel today   Medication Instructions:  No changes  If you need a refill on your cardiac medications before your next appointment, please call your pharmacy.    Lab work: No new labs needed   If you have labs (blood work) drawn today and your tests are completely normal, you will receive your results only by: Marland Kitchen MyChart Message (if you have MyChart) OR . A paper copy in the mail If you have any lab test that is abnormal or we need to change your treatment, we will call you to review the results.   Testing/Procedures: No new testing needed   Follow-Up: At [facility], you and your health needs are our priority.  As part of our continuing mission to provide you with exceptional heart care, we have created designated Provider Care Teams.  These Care Teams include your primary Cardiologist (physician) and Advanced Practice Providers (APPs -  Physician Assistants and Nurse Practitioners) who all work together to provide you with the care you need, when you need it.  . You will need a follow up appointment in 12 months .   Please call our office 2 months in advance to schedule this appointment.    . Providers on your designated Care Team:   . Murray Hodgkins, NP . Christell Faith, PA-C . Marrianne Mood, PA-C  Any Other Special Instructions Will Be Listed Below (If Applicable).  For educational health videos Log in to : [email or web address] Or : [email or web address], password : [email or web address]

## 2019-09-08 LAB — LIPID PANEL
Chol/HDL Ratio: 2.7 ratio (ref 0.0–4.4)
Cholesterol, Total: 154 mg/dL (ref 100–199)
HDL: 57 mg/dL (ref >39)
LDL Chol Calc (NIH): 82 mg/dL (ref 0–99)
Triglycerides: 78 mg/dL (ref 0–149)
VLDL Cholesterol Cal: 15 mg/dL (ref 5–40)

## 2019-09-14 ENCOUNTER — Inpatient Hospital Stay: Payer: Medicare Other | Attending: Hematology and Oncology

## 2019-09-14 ENCOUNTER — Other Ambulatory Visit: Payer: Self-pay

## 2019-09-14 ENCOUNTER — Other Ambulatory Visit: Payer: Medicare Other

## 2019-09-14 DIAGNOSIS — C50412 Malignant neoplasm of upper-outer quadrant of left female breast: Secondary | ICD-10-CM | POA: Diagnosis not present

## 2019-09-14 DIAGNOSIS — Z170 Estrogen receptor positive status [ER+]: Secondary | ICD-10-CM

## 2019-09-14 DIAGNOSIS — M85851 Other specified disorders of bone density and structure, right thigh: Secondary | ICD-10-CM

## 2019-09-14 DIAGNOSIS — Z5111 Encounter for antineoplastic chemotherapy: Secondary | ICD-10-CM | POA: Insufficient documentation

## 2019-09-14 MED ORDER — FULVESTRANT 250 MG/5ML IM SOLN
500.0000 mg | Freq: Once | INTRAMUSCULAR | Status: AC
  Administered 2019-09-14: 500 mg via INTRAMUSCULAR

## 2019-09-16 ENCOUNTER — Ambulatory Visit
Admission: RE | Admit: 2019-09-16 | Discharge: 2019-09-16 | Disposition: A | Discharge status: Home / Self Care | Payer: Medicare Other | Source: Ambulatory Visit | Attending: Hematology and Oncology | Admitting: Hematology and Oncology

## 2019-09-16 DIAGNOSIS — M85851 Other specified disorders of bone density and structure, right thigh: Secondary | ICD-10-CM | POA: Diagnosis not present

## 2019-09-16 DIAGNOSIS — M8589 Other specified disorders of bone density and structure, multiple sites: Secondary | ICD-10-CM | POA: Diagnosis not present

## 2019-09-16 DIAGNOSIS — Z780 Asymptomatic menopausal state: Secondary | ICD-10-CM | POA: Diagnosis not present

## 2019-09-16 HISTORY — DX: Personal history of irradiation: Z92.3

## 2019-09-16 HISTORY — DX: Personal history of antineoplastic chemotherapy: Z92.21

## 2019-10-06 ENCOUNTER — Other Ambulatory Visit: Payer: Self-pay | Admitting: Cardiovascular Disease

## 2019-10-06 ENCOUNTER — Other Ambulatory Visit: Payer: Self-pay | Admitting: Internal Medicine

## 2019-10-06 DIAGNOSIS — G5793 Unspecified mononeuropathy of bilateral lower limbs: Secondary | ICD-10-CM

## 2019-10-06 NOTE — Progress Notes (Signed)
[facility]  [address], [address] [place], [place] [address] Phone: [phone]  Fax: [phone]   Clinic Day:  10/12/2019  Referring physician: Glean Hess, MD  Chief Complaint: Kelli Williams is a 85 y.o. female with metastatic breast cancer who is seen for 3 month assessment on monthlyFaslodex.   HPI: The patient was last seen in the medical oncology clinic on 07/19/2019. At that time, she was doing well.  She noted no concerns except for weight gain. Exam was stable. CA 27.29 was normal. She received Faslodex 500 mg.   She received Faslodex 500 mg monthly on 08/17/2019 and 09/14/2019.   Bone density on 09/16/2019 revealed osteopenia with a T-score of -1.9 at the forearm radius.   During the interim, she has seen Dr. Rockey Situ for continued management of heart issues. She continues to have chronic dental issues and has seen multiple dentists. She has chronic ongoing fatigue. Denies nausea, vomiting, diarrhea, abdominal pain or weight changes. She has chronic pain primarily at shoulders, rates 6/10. Says she normally takes tylenol for this pain which helps but she didn't take any today. She has previously been offered radiation for this and declined and says she wouldn't want radiation. She continues to use a walker.    Past Medical History:  Diagnosis Date  . Anxiety   . Arthritis    BOTH FEET AND KNEES  . Breast cancer ([place]) 2000   left breast ca with lumpectomy and rad tx 5 yr tamoxifen/Dr Jeannine Kitten at [place]  . Complication of anesthesia    WOKE UP DURING SURGERY FOR DEVIATED SEPTUM, KNEE REPLACEMENT AND BREAST LUMPECTOMY  . Dysrhythmia   . GERD (gastroesophageal reflux disease)   . Hypertension   . Neuropathic pain of both legs   . Neuropathy   . Personal history of chemotherapy   . Personal history of radiation therapy   . Pneumonia 2016  . Skin cancer of forehead   . Skin cancer of nose     Past Surgical History:  Procedure Laterality  Date  . ABDOMINAL HYSTERECTOMY    . AXILLARY LYMPH NODE BIOPSY Left 07/10/2017   INVASIVE MAMMARY CARCINOMA WITH FOCAL LOBULAR FEATURES.   Marland Kitchen BREAST BIOPSY Bilateral 1960   benign  . BREAST LUMPECTOMY Left 2000   breast ca with rad tx  . BREAST LUMPECTOMY Left 08/20/2017  . BREAST LUMPECTOMY Left 08/20/2017   Procedure: left breast wide excision;  Surgeon: Robert Bellow, MD;  Location: [facility] ORS;  Service: General;  Laterality: Left;  . CATARACT EXTRACTION     left eye  . CHOLECYSTECTOMY    . JOINT REPLACEMENT    . NASAL SEPTUM SURGERY    . PARTIAL KNEE ARTHROPLASTY     right  . TOTAL VAGINAL HYSTERECTOMY      Family History  Problem Relation Age of Onset  . Heart disease Mother   . Heart attack Paternal Uncle   . Breast cancer Daughter 109       genetic negative  . Cancer Paternal Grandmother     Social History:  reports that she has never smoked. She has never used smokeless tobacco. She reports that she does not drink alcohol or use drugs. The patient's husband died 2 years ago.She was born outside of [place].She previously worked as an Web designer. She lives at the [facility]. She is independent except for the use of her walker. Patient has a daughter, Feleica Fulmore. The patient is unaccompanied today.  Allergies:  Allergies  Allergen Reactions  . Amitriptyline Hives  . Amoxicillin     Diarrhea    . Black Pepper [[place]]   . Ivp Dye [Iodinated Diagnostic Agents]   . Tape Other (See Comments)    [place] - please use paper tape.  . Sulfa Antibiotics Rash  . Sulfa Antibiotics Rash    Current Medications: Current Outpatient Medications  Medication Sig Dispense Refill  . acetaminophen (TYLENOL) 325 MG tablet Take 650 mg by mouth every 6 (six) hours as needed.    Marland Kitchen aspirin 81 MG tablet Take 1 tablet by mouth daily.    . Calcium-Magnesium 500-250 MG TABS Take 1 tablet by mouth daily.     . chlorhexidine gluconate, MEDLINE KIT, (PERIDEX)  0.12 % solution Use as directed 5 mLs in the mouth or throat 2 (two) times daily.     . clindamycin (CLEOCIN) 300 MG capsule Take 300 mg by mouth once. Prior to dental appointment    . Coenzyme Q10 (CO Q-10) 100 MG CAPS Take 1 tablet by mouth daily.     . diphenoxylate-atropine (LOMOTIL) 2.5-0.025 MG tablet Take 1 tablet by mouth 4 (four) times daily as needed for diarrhea or loose stools. 30 tablet 0  . ezetimibe (ZETIA) 10 MG tablet TAKE 1 TABLET BY MOUTH  DAILY 90 tablet 3  . gabapentin (NEURONTIN) 300 MG capsule TAKE 3 CAPSULES BY MOUTH  TWICE DAILY 540 capsule 3  . lisinopril-hydrochlorothiazide (ZESTORETIC) 20-25 MG tablet TAKE 1 AND 1/2 TABLETS BY  MOUTH DAILY 135 tablet 3  . meloxicam (MOBIC) 15 MG tablet TAKE 1 TABLET BY MOUTH  DAILY 90 tablet 3  . Multiple Vitamins-Minerals (MULTIVITAMIN WITH MINERALS) tablet Take 1 tablet by mouth daily.    Marland Kitchen omeprazole (PRILOSEC) 20 MG capsule TAKE 1 CAPSULE BY MOUTH  DAILY 90 capsule 3  . traMADol (ULTRAM) 50 MG tablet TAKE 1/2 TO 1 TABLET BY  MOUTH EVERY 8 HOURS AS  NEEDED FOR PAIN 90 tablet 0   No current facility-administered medications for this visit.     Review of Systems  Constitutional: Positive for malaise/fatigue. Negative for chills, diaphoresis, fever and weight loss (up 16lbs).       Feels "fine".  HENT: Negative for congestion, ear pain, nosebleeds, sinus pain and sore throat.        Dental issues (chronic).  Eyes: Negative.  Negative for blurred vision, double vision, photophobia and pain.  Respiratory: Negative.  Negative for cough, hemoptysis, sputum production and shortness of breath.   Cardiovascular: Positive for leg swelling (chronic). Negative for chest pain, palpitations and PND.  Gastrointestinal: Negative.  Negative for abdominal pain, blood in stool, constipation, diarrhea, melena, nausea and vomiting.  Genitourinary: Negative.  Negative for dysuria, frequency, hematuria and urgency.  Musculoskeletal: Positive for back  pain (chronic), joint pain (chronic LEFT knee "bone on bone" and BILATERAL hip pain) and neck pain (shoulders ). Negative for falls and myalgias.       4 wheel walker  Skin: Negative.  Negative for itching and rash.  Neurological: Positive for sensory change (stable neuropathy on gabapentin). Negative for tremors, speech change, focal weakness, weakness and headaches.       Chronic balance issues.  Endo/Heme/Allergies: Does not bruise/bleed easily.       Allergy to pepper.  Psychiatric/Behavioral: Negative.  Negative for depression and memory loss. The patient is not nervous/anxious and does not have insomnia.   All other systems reviewed and are negative.   Performance status (ECOG):  2  Vitals Blood pressure (!) 142/66, pulse 65, temperature (!) 97.1 F (36.2 C), temperature source Tympanic, resp. rate 18, height 5' 3.5" (1.613 m), weight 199 lb 13.9 oz (90.7 kg), SpO2 97 %.   Physical Exam  Constitutional: She is oriented to person, place, and time. Vital signs are normal. She appears well-developed and well-nourished. No distress.  Elderly woman sitting comfortably in the exam room in no acute distress.  She has a rolling walker at her side.  She is examined in her chair.  HENT:  Head: Normocephalic and atraumatic.  Mouth/Throat: Oropharynx is clear and moist. No oropharyngeal exudate.  Long gray hair.  Wearing mask.  Eyes: Pupils are equal, round, and reactive to light. Conjunctivae and EOM are normal. No scleral icterus.  Glasses.   Neck: Normal range of motion. Neck supple. No JVD present.  Cardiovascular: Normal rate, regular rhythm and normal heart sounds. Exam reveals no gallop.  No murmur heard. Pulmonary/Chest: Effort normal and breath sounds normal. No respiratory distress. She has no wheezes. She has no rales. She exhibits no tenderness.  Abdominal: Soft. Normal appearance and bowel sounds are normal. She exhibits no distension and no mass. There is no abdominal tenderness.  There is no rebound and no guarding.  Musculoskeletal:        General: Edema (chronic lower extremity; left > right) present.     Thoracic back: She exhibits tenderness and bony tenderness.     Lumbar back: She exhibits tenderness and bony tenderness.  Lymphadenopathy:    She has no cervical adenopathy.    She has no axillary adenopathy.       Right: No supraclavicular adenopathy present.       Left: No supraclavicular adenopathy present.  Neurological: She is alert and oriented to person, place, and time.  Skin: Skin is warm and dry. No rash noted. She is not diaphoretic. No erythema. There is pallor.  Psychiatric: She has a normal mood and affect. Her behavior is normal. Judgment and thought content normal.  Nursing note and vitals reviewed.  Imaging studies: 09/16/2017: PET scanrevealed widespread scattered hypermetabolic lytic osseous metastasesthroughout the axial and proximal appendicular skeleton. Bilateral proximal femoral skeletal metastases placed the patient at risk for pathologic hip fractures. There was mild hypermetabolic right hilar lymphadenopathy.  09/23/2017: Plain films of the bilateral femursrevealed no pathologic fracture. The LEFT view showed lytic subtrochanteric metastasis that was previously seen on PET scan.  06/01/2018: PET scandemonstrated marked improvementin the osseous metastatic disease. Previously demonstrated areas of abnormally high metabolic activity have resolved, with the exception of a very faint area of residual activity (SUV 2.6; previously 11.5) in the LEFT subtrochanteric region. Previous LEFT axillary fluid collection now shows a 1.8 x 1.2 cm scarlike density with low-grade activity (SUV 2.7). Incidental findings include a small hiatal hernia and diverticulosis of the sigmoid colon.  02/11/2018: Lumbar spine MRI revealed multifocal lumbosacral vertebral body signal abnormalities consistent with osseous metastatic disease. The largest lesion  was at L1. There was no pathologic compression fracture. There was multi-level moderate to severe neural foraminal stenosis, which had progressed since the prior study. 09/26/2018: Sacral MRI revealed scattered small marrow lesionsc/wmetastatic disease.The0.6 cm lesion in the S1 vertebral bodywasnew since the comparison lumbar spine MRI. Small lesion in the left ilium adjacent to the SI joint was present on the prior MRI. The remaining lesions werenot included on the prior MRI. There was nofracture or other acute abnormality.There was lower lumbar spondylosisand diverticulosis. 10/15/2018: PET scanrevealed no evidence  of active breast cancer metastasis on FDG PET scan. There were stable lytic lesions in the spine without evidence of metabolic activity consistent treated metastasis. 05/20/2019:PET scanrevealed no evidence of breast cancer recurrence or progression.There were stable lytic lesions in the spine without associated metabolic activity.CT datawasdegraded by patient motion. 09/17/2019: Bone Density: T score -1.9 at forearm radium consistent osteopenia   Appointment on 10/12/2019  Component Date Value Ref Range Status  . Sodium 10/12/2019 136  135 - 145 mmol/L Final  . Potassium 10/12/2019 3.6  3.5 - 5.1 mmol/L Final  . Chloride 10/12/2019 99  98 - 111 mmol/L Final  . CO2 10/12/2019 27  22 - 32 mmol/L Final  . Glucose, Bld 10/12/2019 104* 70 - 99 mg/dL Final  . BUN 10/12/2019 24* 8 - 23 mg/dL Final  . Creatinine, Ser 10/12/2019 0.89  0.44 - 1.00 mg/dL Final  . Calcium 10/12/2019 9.5  8.9 - 10.3 mg/dL Final  . Total Protein 10/12/2019 7.4  6.5 - 8.1 g/dL Final  . Albumin 10/12/2019 4.0  3.5 - 5.0 g/dL Final  . AST 10/12/2019 21  15 - 41 U/L Final  . ALT 10/12/2019 13  0 - 44 U/L Final  . Alkaline Phosphatase 10/12/2019 83  38 - 126 U/L Final  . Total Bilirubin 10/12/2019 0.2* 0.3 - 1.2 mg/dL Final  . GFR calc non Af Amer 10/12/2019 57* >60 mL/min Final  . GFR  calc Af Amer 10/12/2019 >60  >60 mL/min Final  . Anion gap 10/12/2019 10  5 - 15 Final   Performed at [facility], [address] [place], [place] [address]  . CA 27.29 10/12/2019 20.6  0.0 - 38.6 U/mL Final   Comment: (NOTE) Siemens Centaur Immunochemiluminometric Methodology [facility]) Values obtained with different assay methods or kits cannot be used interchangeably. Results cannot be interpreted as absolute evidence of the presence or absence of malignant disease. Performed At: [facility] [place], [place] [id number] Rush Farmer MD TD:[id number]   . WBC 10/12/2019 5.5  4.0 - 10.5 K/uL Final  . RBC 10/12/2019 4.30  3.87 - 5.11 MIL/uL Final  . Hemoglobin 10/12/2019 12.5  12.0 - 15.0 g/dL Final  . HCT 10/12/2019 37.3  36.0 - 46.0 % Final  . MCV 10/12/2019 86.7  80.0 - 100.0 fL Final  . MCH 10/12/2019 29.1  26.0 - 34.0 pg Final  . MCHC 10/12/2019 33.5  30.0 - 36.0 g/dL Final  . RDW 10/12/2019 14.6  11.5 - 15.5 % Final  . Platelets 10/12/2019 218  150 - 400 K/uL Final  . nRBC 10/12/2019 0.0  0.0 - 0.2 % Final  . Neutrophils Relative % 10/12/2019 64  % Final  . Neutro Abs 10/12/2019 3.5  1.7 - 7.7 K/uL Final  . Lymphocytes Relative 10/12/2019 21  % Final  . Lymphs Abs 10/12/2019 1.1  0.7 - 4.0 K/uL Final  . Monocytes Relative 10/12/2019 9  % Final  . Monocytes Absolute 10/12/2019 0.5  0.1 - 1.0 K/uL Final  . Eosinophils Relative 10/12/2019 5  % Final  . Eosinophils Absolute 10/12/2019 0.3  0.0 - 0.5 K/uL Final  . Basophils Relative 10/12/2019 1  % Final  . Basophils Absolute 10/12/2019 0.0  0.0 - 0.1 K/uL Final  . Immature Granulocytes 10/12/2019 0  % Final  . Abs Immature Granulocytes 10/12/2019 0.02  0.00 - 0.07 K/uL Final   Performed at [facility], [address] [place], [place] [address]  Assessment:  SHADEN HIGLEY is a 85 y.o. female with metastatic breast cancer s/p left axillary biopsy on 07/10/2017. Pathology  revealed a 1.1 cm grade II invasive mammary carcinoma with focal lobular features. Wide excisionon 08/20/2017 revealed metastatic mammary carcinoma involving 1 of 4 axillary lymph nodes with extracapsular extension. The superior medial margin was involved. Tumor was ER + (>90%), PR + (>90%), and Her2/neu 2+ (FISH -).   Inv[facility]ae genetic testingon 09/26/2017 revealed no pathogenic sequence variants or deletions/duplications. BRCA1/2 testing on 09/26/2017 was negative.  She has a history of left breast cancers/p wide excisionand sentinel lymph node biopsy on 07/18/1999 at [facility]. Pathology revealed a 0.8 x 0.6 x 0.4 cm grade II invasive ductal adenocarcinoma. There was no lymphovascular invasion. 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 Margins were negative. Tumor was ER + (50%) and PR + (90%). Pathologic stagewas T1bN0. She received 6200 cGyfrom 08/21/1999 - 10/03/1999. She completed 5 years of tamoxifenin 06/2004.   PET scanon 09/16/2017 revealed widespread scattered hypermetabolic lytic osseous metastases throughout the axial and proximal appendicular skeleton. Bilateral proximal femoral skeletal metastases placed the patient at risk for pathologic hip fractures. There was mild hypermetabolic right hilar lymphadenopathy. Plain films of the bilateral femurson 09/23/2017 revealed no pathologic fracture. The LEFT view showed lytic subtrochanteric metastasis that was previously seen on PET scan.   PET scan on 05/20/2019 revealedno evidence of breast cancer recurrence or progression.There were stable lytic lesions in the spine without associated metabolic activity.CT datawasdegraded by patient motion.  CA27.29has been followed: 14.7 on 02/05/2017, 19.1 on 02/06/2012, 172.2 on 09/04/2017, 144.9 on 11/06/2017, 73.4 on 02/05/2018, 63.3 on 03/06/2018, 47.2 on 04/06/2018, 42.4 on 05/07/2018, 36.6 on 06/08/2018, 34.6 on 07/06/2018, 27.1 on 08/06/2018, 28.2 on  09/10/2018, 26.0 on 10/09/2018, 30.9 on 11/09/2018, 27.7 on 12/07/2018, 35.8 on 01/04/2019, 24.8 on 02/01/2019, 24.2 on 04/26/2019, 21.6 on 05/24/2019, and 24.5 on 07/19/2019.  She received a palliative course of radiation(3000 cGy in 10 fractions) to the left hip from 10/14/2017 - 10/27/2017. She received a course of palliative radiationthe the lumbar spine and SI joints from 03/04/2018 - 03/27/2018.  She began Faslodexon 09/26/2017 (last 06/21/2019). She began monthly Xgevaon 09/26/2017 (last 05/07/2018). She has ongoing dental issues.  Bone densityon 09/01/2017 revealed osteopeniawith a T-score of -1.9 in the right femoral neck and left forearm. Repeat bone density consistent with osteopenia. She is on calcium and vitamin D.  Symptomatically, she is stable. She continues to have chronic pain secondary to bone metastasis. Declines radiation. Delton See held d/t chronic dental issues.   Plan: 1.   Labs today: CBC with diff, CMP, CA 27.29.  2.Metastatic breast cancer Clinically stable.  Exam is stable.  CA27.29 is stable and normal.  PET scan from 05/20/2019 revealed no evidence of progression Plan to repeat pet scan prior to next visit.  Labs today reviewed and ok to proceed with Faslodex Plan to repeat monthly faslodex 3.Bone metastasis Shoulder pain questionable for progressive disease.  Pet Scan from 05/20/2019 showed no evidence of active disease however.  Xgeva on permanent hold secondary to dental issues Plan for pet for restaging.  4.Osteopenia Repeat bone density scan showed t score -1.9 consistent with osteopenia Currently on calcium and vitamin d.  Plan to repeat bone density in 08/2021 Continue calcium, vitamin d and weight bearing exercise as tolerated.  5.   Dental issues Continue to follow up with dentist and endodontist regarding ongoing dental issues and extractions Hx of xgeva use however, on permanent hold secondary to  dental issues 6. Pepper allergy  Significant pepper allergy per patient. She resides at [facility]. Meals provided by her daughter & frozen meals.  7.   Vaccination for influenza  Patient to receive influenza vaccination today 8. Chronic neuropathic pain  On gabapentin  Patient reports poor control  Will refer to palliative care in [place] (patient's request) to advanced care planning  and ongoing symptom management  Disposition Faslodex today.   PET scan this month Referral to palliative care RTC monthly x2 for Faslodex.   RTC for MD evaluation and results of pet following pet scan with labs at tha ttime (CBC with diff, CMP, CA 27.29)  I discussed the assessment and treatment plan with the patient.  The patient was provided an opportunity to ask questions and all were answered.  The patient agreed with the plan and demonstrated an understanding of the instructions.  The patient was advised to call back if the symptoms worsen or if the condition fails to improve as anticipated.  I provided 35 minutes of face-to-face time during this this encounter and > 50% was spent counseling as documented under my assessment and plan.   Beckey Rutter, DNP, AGNP-C [place] at [facility] [phone] (clinic)  CC: Dr. Mike Gip

## 2019-10-12 ENCOUNTER — Inpatient Hospital Stay: Payer: Medicare Other

## 2019-10-12 ENCOUNTER — Inpatient Hospital Stay: Payer: Medicare Other | Attending: Hematology and Oncology | Admitting: Nurse Practitioner

## 2019-10-12 ENCOUNTER — Encounter: Payer: Self-pay | Admitting: Nurse Practitioner

## 2019-10-12 ENCOUNTER — Other Ambulatory Visit: Payer: Self-pay

## 2019-10-12 VITALS — BP 142/66 | HR 65 | Temp 97.1°F | Resp 18 | Ht 63.5 in | Wt 199.9 lb

## 2019-10-12 DIAGNOSIS — Z23 Encounter for immunization: Secondary | ICD-10-CM | POA: Diagnosis not present

## 2019-10-12 DIAGNOSIS — C7951 Secondary malignant neoplasm of bone: Secondary | ICD-10-CM

## 2019-10-12 DIAGNOSIS — Z5111 Encounter for antineoplastic chemotherapy: Secondary | ICD-10-CM | POA: Insufficient documentation

## 2019-10-12 DIAGNOSIS — C773 Secondary and unspecified malignant neoplasm of axilla and upper limb lymph nodes: Secondary | ICD-10-CM | POA: Diagnosis not present

## 2019-10-12 DIAGNOSIS — C50412 Malignant neoplasm of upper-outer quadrant of left female breast: Secondary | ICD-10-CM

## 2019-10-12 DIAGNOSIS — Z170 Estrogen receptor positive status [ER+]: Secondary | ICD-10-CM

## 2019-10-12 DIAGNOSIS — K089 Disorder of teeth and supporting structures, unspecified: Secondary | ICD-10-CM

## 2019-10-12 DIAGNOSIS — Z299 Encounter for prophylactic measures, unspecified: Secondary | ICD-10-CM | POA: Diagnosis not present

## 2019-10-12 DIAGNOSIS — M8580 Other specified disorders of bone density and structure, unspecified site: Secondary | ICD-10-CM | POA: Diagnosis not present

## 2019-10-12 LAB — CBC WITH DIFFERENTIAL/PLATELET
Abs Immature Granulocytes: 0.02 10*3/uL (ref 0.00–0.07)
Basophils Absolute: 0 10*3/uL (ref 0.0–0.1)
Basophils Relative: 1 %
Eosinophils Absolute: 0.3 10*3/uL (ref 0.0–0.5)
Eosinophils Relative: 5 %
HCT: 37.3 % (ref 36.0–46.0)
Hemoglobin: 12.5 g/dL (ref 12.0–15.0)
Immature Granulocytes: 0 %
Lymphocytes Relative: 21 %
Lymphs Abs: 1.1 10*3/uL (ref 0.7–4.0)
MCH: 29.1 pg (ref 26.0–34.0)
MCHC: 33.5 g/dL (ref 30.0–36.0)
MCV: 86.7 fL (ref 80.0–100.0)
Monocytes Absolute: 0.5 10*3/uL (ref 0.1–1.0)
Monocytes Relative: 9 %
Neutro Abs: 3.5 10*3/uL (ref 1.7–7.7)
Neutrophils Relative %: 64 %
Platelets: 218 10*3/uL (ref 150–400)
RBC: 4.3 MIL/uL (ref 3.87–5.11)
RDW: 14.6 % (ref 11.5–15.5)
WBC: 5.5 10*3/uL (ref 4.0–10.5)
nRBC: 0 % (ref 0.0–0.2)

## 2019-10-12 LAB — COMPREHENSIVE METABOLIC PANEL
ALT: 13 U/L (ref 0–44)
AST: 21 U/L (ref 15–41)
Albumin: 4 g/dL (ref 3.5–5.0)
Alkaline Phosphatase: 83 U/L (ref 38–126)
Anion gap: 10 (ref 5–15)
BUN: 24 mg/dL — ABNORMAL HIGH (ref 8–23)
CO2: 27 mmol/L (ref 22–32)
Calcium: 9.5 mg/dL (ref 8.9–10.3)
Chloride: 99 mmol/L (ref 98–111)
Creatinine, Ser: 0.89 mg/dL (ref 0.44–1.00)
GFR calc Af Amer: >60 mL/min (ref >60)
GFR calc non Af Amer: 57 mL/min — ABNORMAL LOW (ref >60)
Glucose, Bld: 104 mg/dL — ABNORMAL HIGH (ref 70–99)
Potassium: 3.6 mmol/L (ref 3.5–5.1)
Sodium: 136 mmol/L (ref 135–145)
Total Bilirubin: 0.2 mg/dL — ABNORMAL LOW (ref 0.3–1.2)
Total Protein: 7.4 g/dL (ref 6.5–8.1)

## 2019-10-12 MED ORDER — FULVESTRANT 250 MG/5ML IM SOLN
500.0000 mg | Freq: Once | INTRAMUSCULAR | Status: AC
  Administered 2019-10-12: 500 mg via INTRAMUSCULAR

## 2019-10-12 MED ORDER — INFLUENZA VAC A&B SA ADJ QUAD 0.5 ML IM PRSY
0.5000 mL | PREFILLED_SYRINGE | Freq: Once | INTRAMUSCULAR | Status: AC
  Administered 2019-10-12: 12:00:00 0.5 mL via INTRAMUSCULAR

## 2019-10-12 MED ORDER — INFLUENZA VAC A&B SA ADJ QUAD 0.5 ML IM PRSY
PREFILLED_SYRINGE | INTRAMUSCULAR | Status: AC
  Filled 2019-10-12: qty 0.5

## 2019-10-12 NOTE — Patient Instructions (Signed)
Preventing Influenza, Adult Influenza, more commonly known as "the flu," is a viral infection that mainly affects the respiratory tract. The respiratory tract includes structures that help you breathe, such as the lungs, nose, and throat. The flu causes many common cold symptoms, as well as a high fever and body aches. The flu spreads easily from person to person (is contagious). The flu is most common from December through March. This is called flu season.You can catch the flu virus by:  Breathing in droplets from an infected person's cough or sneeze.  Touching something that was recently contaminated with the virus and then touching your mouth, nose, or eyes. What can I do to lower my risk?        You can decrease your risk of getting the flu by:  Getting a flu shot (influenza vaccination) every year. This is the best way to prevent the flu. A flu shot is recommended for everyone age 6 months and older. ? It is best to get a flu shot in the fall, as soon as it is available. Getting a flu shot during winter or spring instead is still a good idea. Flu season can last into early spring. ? Preventing the flu through vaccination requires getting a new flu shot every year. This is because the flu virus changes slightly (mutates) from one year to the next. Even if a flu shot does not completely protect you from all flu virus mutations, it can reduce the severity of your illness and prevent dangerous complications of the flu. ? If you are pregnant, you can and should get a flu shot. ? If you have had a reaction to the shot in the past or if you are allergic to eggs, check with your health care provider before getting a flu shot. ? Sometimes the vaccine is available as a nasal spray. In some years, the nasal spray has not been as effective against the flu virus. Check with your health care provider if you have questions about this.  Practicing good health habits. This is especially important during  flu season. ? Avoid contact with people who are sick with flu or cold symptoms. ? Wash your hands with soap and water often. If soap and water are not available, use alcohol-based hand sanitizer. ? Avoid touching your hands to your face, especially when you have not washed your hands recently. ? Use a disinfectant to clean surfaces at home and at work that may be contaminated with the flu virus. ? Keep your body's disease-fighting system (immune system) in good shape by eating a healthy diet, drinking plenty of fluids, getting enough sleep, and exercising regularly. If you do get the flu, avoid spreading it to others by:  Staying home until your symptoms have been gone for at least one day.  Covering your mouth and nose when you cough or sneeze.  Avoiding close contact with others, especially babies and elderly people. Why are these changes important? Getting a flu shot and practicing good health habits protects you as well as other people. If you get the flu, your friends, family, and co-workers are also at risk of getting it, because it spreads so easily to others. Each year, about 2 out of every 10 people get the flu. Having the flu can lead to complications, such as pneumonia, ear infection, and sinus infection. The flu also can be deadly, especially for babies, people older than age 65, and people who have serious long-term diseases. How is this treated? Most   people recover from the flu by resting at home and drinking plenty of fluids. However, a prescription antiviral medicine may reduce your flu symptoms and may make your flu go away sooner. This medicine must be started within a few days of getting flu symptoms. You can talk with your health care provider about whether you need an antiviral medicine. Antiviral medicine may be prescribed for people who are at risk for more serious flu symptoms. This includes people who:  Are older than age 69.  Are pregnant.  Have a condition that  makes the flu worse or more dangerous. Where to find more information  Centers for Disease Control and Prevention: http://www.smith-bell.org/  [facility].com.ee: azureicus.com  American Academy of Family Physicians: familydoctor.org/familydoctor/en/kids/vaccines/preventing-the-flu.html Contact a health care provider if:  You have influenza and you develop new symptoms.  You have: ? Chest pain. ? Diarrhea. ? A fever.  Your cough gets worse, or you produce more mucus. Summary  The best way to prevent the flu is to get a flu shot every year in the fall.  Even if you get the flu after you have received the yearly vaccine, your flu may be milder and go away sooner because of your flu shot.  If you get the flu, antiviral medicines that are started with a few days of symptoms may reduce your flu symptoms and may make your flu go away sooner.  You can also help prevent the flu by practicing good health habits. This information is not intended to replace advice given to you by your health care provider. Make sure you discuss any questions you have with your health care provider. Document Released: 12/10/2015 Document Revised: 11/07/2017 Document Reviewed: 08/03/2016 Elsevier Patient Education  [place]. Fulvestrant injection What is this medicine? FULVESTRANT (ful VES trant) blocks the effects of estrogen. It is used to treat breast cancer. This medicine may be used for other purposes; ask your health care provider or pharmacist if you have questions. COMMON BRAND NAME(S): FASLODEX What should I tell my health care provider before I take this medicine? They need to know if you have any of these conditions:  bleeding disorders  liver disease  low blood counts, like low white cell, platelet, or red cell counts  an unusual or allergic reaction to fulvestrant, other medicines, foods, dyes, or preservatives  pregnant or trying to get pregnant  breast-feeding How  should I use this medicine? This medicine is for injection into a muscle. It is usually given by a health care professional in a hospital or clinic setting. Talk to your pediatrician regarding the use of this medicine in children. Special care may be needed. Overdosage: If you think you have taken too much of this medicine contact a poison control center or emergency room at once. NOTE: This medicine is only for you. Do not share this medicine with others. What if I miss a dose? It is important not to miss your dose. Call your doctor or health care professional if you are unable to keep an appointment. What may interact with this medicine?  medicines that treat or prevent blood clots like warfarin, enoxaparin, dalteparin, apixaban, dabigatran, and rivaroxaban This list may not describe all possible interactions. Give your health care provider a list of all the medicines, herbs, non-prescription drugs, or dietary supplements you use. Also tell them if you smoke, drink alcohol, or use illegal drugs. Some items may interact with your medicine. What should I watch for while using this medicine? Your condition will  be monitored carefully while you are receiving this medicine. You will need important blood work done while you are taking this medicine. Do not become pregnant while taking this medicine or for at least 1 year after stopping it. Women of child-bearing potential will need to have a negative pregnancy test before starting this medicine. Women should inform their doctor if they wish to become pregnant or think they might be pregnant. There is a potential for serious side effects to an unborn child. Men should inform their doctors if they wish to father a child. This medicine may lower sperm counts. Talk to your health care professional or pharmacist for more information. Do not breast-feed an infant while taking this medicine or for 1 year after the last dose. What side effects may I notice from  receiving this medicine? Side effects that you should report to your doctor or health care professional as soon as possible:  allergic reactions like skin rash, itching or hives, swelling of the face, lips, or tongue  feeling faint or lightheaded, falls  pain, tingling, numbness, or weakness in the legs  signs and symptoms of infection like fever or chills; cough; flu-like symptoms; sore throat  vaginal bleeding Side effects that usually do not require medical attention (report to your doctor or health care professional if they continue or are bothersome):  aches, pains  constipation  diarrhea  headache  hot flashes  nausea, vomiting  pain at site where injected  stomach pain This list may not describe all possible side effects. Call your doctor for medical advice about side effects. You may report side effects to FDA at 1-800-FDA-1088. Where should I keep my medicine? This drug is given in a hospital or clinic and will not be stored at home. NOTE: This sheet is a summary. It may not cover all possible information. If you have questions about this medicine, talk to your doctor, pharmacist, or health care provider.  2020 Elsevier/Gold Standard (2018-03-05 11:34:41)

## 2019-10-12 NOTE — Progress Notes (Signed)
The patient c/o lower back pain ( today pain level 6) constantly

## 2019-10-13 ENCOUNTER — Ambulatory Visit (INDEPENDENT_AMBULATORY_CARE_PROVIDER_SITE_OTHER): Payer: Medicare Other

## 2019-10-13 VITALS — BP 134/89 | Temp 97.0°F | Ht 64.0 in | Wt 196.0 lb

## 2019-10-13 DIAGNOSIS — K089 Disorder of teeth and supporting structures, unspecified: Secondary | ICD-10-CM | POA: Insufficient documentation

## 2019-10-13 DIAGNOSIS — Z0000 Encounter for general adult medical examination without abnormal findings: Secondary | ICD-10-CM

## 2019-10-13 LAB — CANCER ANTIGEN 27.29: CA 27.29: 20.6 U/mL (ref 0.0–38.6)

## 2019-10-13 NOTE — Progress Notes (Addendum)
Subjective:   Kelli Williams is a 85 y.o. female who presents for Medicare Annual (Subsequent) preventive examination.  Virtual Visit via Telephone Note  I connected with Kelli Williams on 10/13/19 at 10:00 AM EST by telephone and verified that I am speaking with the correct person using two identifiers.  Medicare Annual Wellness visit completed telephonically due to Covid-19 pandemic.   Location: Patient: home Provider: office   I discussed the limitations, risks, security and privacy concerns of performing an evaluation and management service by telephone and the availability of in person appointments. The patient expressed understanding and agreed to proceed.  Some vital signs may be absent or patient reported.   Clemetine Marker, LPN   Review of Systems:   Cardiac Risk Factors include: advanced age (>50mn, >>59women);hypertension;dyslipidemia;obesity (BMI >30kg/m2)     Objective:     Vitals: BP 134/89    Temp (!) 97 F (36.1 C)    Ht _0  (1.626 m)    Wt 196 lb (88.9 kg)    BMI 33.64 kg/m   Body mass index is 33.64 kg/m.  Advanced Directives 10/13/2019 10/12/2019 07/19/2019 05/24/2019 04/26/2019 03/01/2019 01/04/2019  Does Patient Have a Medical Advance Directive? _1  Yes Yes  Type of Advance Directive - [place] will [place] will [place] will [place]Living will [place]Living will [place] will  Does patient want to make changes to medical advance directive? - - - - - No - Patient declined No - Patient declined  Copy of [place] Chart? Yes - validated most recent copy scanned in chart (See row information) Yes - validated most recent copy scanned in chart (See row information) No - copy requested No - copy requested No - copy requested Yes - validated most recent copy scanned in chart  (See row information) Yes - validated most recent copy scanned in chart (See row information)  Would patient like information on creating a medical advance directive? - No - Patient declined - - - No - Patient declined No - Patient declined    Tobacco Social History   Tobacco Use  Smoking Status Never Smoker  Smokeless Tobacco Never Used  Tobacco Comment   smoking cessation materials not required     Counseling given: Not Answered Comment: smoking cessation materials not required   Clinical Intake:  Pre-visit preparation completed: Yes  Pain : No/denies pain     BMI - recorded: 33.64 Nutritional Status: BMI > 30  Obese Nutritional Risks: None Diabetes: No  How often do you need to have someone help you when you read instructions, pamphlets, or other written materials from your doctor or pharmacy?: 1 - Never  Interpreter Needed?: No  Information entered by :: KClemetine MarkerLPN  Past Medical History:  Diagnosis Date   Anxiety    Arthritis    BOTH FEET AND KNEES   Breast cancer ([facility] 2000   left breast ca with lumpectomy and rad tx 5 yr [facility]  Complication of anesthesia    WOKE UP DURING SURGERY FOR DEVIATED SEPTUM, KNEE REPLACEMENT AND BREAST LUMPECTOMY   Dysrhythmia    GERD (gastroesophageal reflux disease)    Hypertension    Neuropathic pain of both legs    Neuropathy    Personal history of chemotherapy    Personal history of radiation therapy    Pneumonia 2016  Skin cancer of forehead    Skin cancer of nose    Past Surgical History:  Procedure Laterality Date   ABDOMINAL HYSTERECTOMY     AXILLARY LYMPH NODE BIOPSY Left 07/10/2017   INVASIVE MAMMARY CARCINOMA WITH FOCAL LOBULAR FEATURES.    BREAST BIOPSY Bilateral 1960   benign   BREAST LUMPECTOMY Left 2000   breast ca with rad tx   BREAST LUMPECTOMY Left 08/20/2017   BREAST LUMPECTOMY Left 08/20/2017   Procedure: left breast wide excision;  Surgeon: Robert Bellow, MD;  Location: [facility] ORS;  Service: General;  Laterality: Left;   CATARACT EXTRACTION     left eye   CHOLECYSTECTOMY     JOINT REPLACEMENT     NASAL SEPTUM SURGERY     PARTIAL KNEE ARTHROPLASTY     right   TOTAL VAGINAL HYSTERECTOMY     Family History  Problem Relation Age of Onset   Heart disease Mother    Heart attack Paternal Uncle    Breast cancer Daughter 64       genetic negative   Cancer Paternal Grandmother    Social History   Socioeconomic History   Marital status: Widowed    Spouse name: Not on file   Number of children: 3   Years of education: some college   Highest education level: 12th grade  Occupational History   Occupation: Retired  Scientist, product/process development strain: Not hard at [employer]    Worry: Never true    Inability: Never true   Transportation needs    Medical: No    Non-medical: No  Tobacco Use   Smoking status: Never Smoker   Smokeless tobacco: Never Used   Tobacco comment: smoking cessation materials not required  Substance and Sexual Activity   Alcohol use: No    Alcohol/week: 0.0 standard drinks   Drug use: No   Sexual activity: Not Currently  Lifestyle   Physical activity    Days per week: 0 days    Minutes per session: 0 min   Stress: Only a little  Relationships   Social connections    Talks on phone: Patient refused    Gets together: Patient refused    Attends religious service: Patient refused    Active member of club or organization: Patient refused    Attends meetings of clubs or organizations: Patient refused    Relationship status: Widowed  Other Topics Concern   Not on file  Social History Narrative   ** Merged History Encounter **        Outpatient Encounter Medications as of 10/13/2019  Medication Sig   acetaminophen (TYLENOL) 325 MG tablet Take 650 mg by mouth every 6 (six) hours as needed.   aspirin 81 MG tablet Take 1 tablet by mouth daily.    Calcium-Magnesium 500-250 MG TABS Take 1 tablet by mouth daily.    Coenzyme Q10 (CO Q-10) 100 MG CAPS Take 1 tablet by mouth daily.    ezetimibe (ZETIA) 10 MG tablet TAKE 1 TABLET BY MOUTH  DAILY   gabapentin (NEURONTIN) 300 MG capsule TAKE 3 CAPSULES BY MOUTH  TWICE DAILY   lisinopril-hydrochlorothiazide (ZESTORETIC) 20-25 MG tablet TAKE 1 AND 1/2 TABLETS BY  MOUTH DAILY   meloxicam (MOBIC) 15 MG tablet TAKE 1 TABLET BY MOUTH  DAILY   Multiple Vitamins-Minerals (MULTIVITAMIN WITH MINERALS) tablet Take 1 tablet by mouth daily.   omeprazole (PRILOSEC) 20 MG capsule TAKE 1 CAPSULE BY MOUTH  DAILY   traMADol (ULTRAM) 50 MG tablet TAKE 1/2 TO 1 TABLET BY  MOUTH EVERY 8 HOURS AS  NEEDED FOR PAIN   clindamycin (CLEOCIN) 300 MG capsule Take 300 mg by mouth once. Prior to dental appointment   [DISCONTINUED] chlorhexidine gluconate, MEDLINE KIT, (PERIDEX) 0.12 % solution Use as directed 5 mLs in the mouth or throat 2 (two) times daily.    [DISCONTINUED] diphenoxylate-atropine (LOMOTIL) 2.5-0.025 MG tablet Take 1 tablet by mouth 4 (four) times daily as needed for diarrhea or loose stools. (Patient not taking: Reported on 10/13/2019)   No facility-administered encounter medications on file as of 10/13/2019.     Activities of Daily Living In your present state of health, do you have any difficulty performing the following activities: 10/13/2019  Hearing? N  Comment declines hearing aids  Vision? N  Difficulty concentrating or making decisions? N  Walking or climbing stairs? Y  Dressing or bathing? N  Doing errands, shopping? Y  Comment no longer drives  Preparing Food and eating ? N  Using the Toilet? N  In the past six months, have you accidently leaked urine? Y  Comment wears pads for protection  Do you have problems with loss of bowel control? N  Managing your Medications? N  Managing your Finances? N  Housekeeping or managing your Housekeeping? N  Some recent data might be hidden     Patient Care Team: Glean Hess, MD as PCP - General (Internal Medicine) [name], MD as Consulting Physician (Cardiology) Renata Caprice as Physician Assistant (Orthopedic Surgery) Bary Castilla, Forest Gleason, MD (General Surgery) Hessie Knows, MD as Consulting Physician (Orthopedic Surgery) Lequita Asal, MD as Consulting Physician (Hematology and Oncology)    Assessment:   This is a routine wellness examination for Nidhi.  Exercise Activities and Dietary recommendations Current Exercise Habits: The patient does not participate in regular exercise at present, Exercise limited by: orthopedic condition(s)  Goals     DIET - INCREASE WATER INTAKE     Recommend to drink at least 6-8 8oz glasses of water per day.       Fall Risk Fall Risk  10/13/2019 06/21/2019 12/16/2018 06/29/2018 06/27/2017  Falls in the past year? 0 0 1 Yes Yes  Comment - - - tripping and loss of balance -  Number falls in past yr: 0 _0 or more 2 or more  Injury with Fall? 0 0 0 No No  Risk Factor Category  - - - High Fall Risk High Fall Risk  Risk for fall due to : Impaired balance/gait;Impaired mobility History of fall(s);Impaired balance/gait;Impaired mobility;Impaired vision History of fall(s);Impaired balance/gait;Impaired mobility;Medication side effect Impaired vision;Impaired balance/gait;Medication side effect;History of fall(s) -  Risk for fall due to: Comment - - - wears eyeglasses; ambulates with rolling walker, joint pain -  Follow up Falls prevention discussed Falls evaluation completed;Falls prevention discussed Falls evaluation completed Falls evaluation completed;Education provided;Falls prevention discussed -   FALL RISK PREVENTION PERTAINING TO THE HOME:  Any stairs in or around the home? Yes  If so, do they handrails? Yes   Home free of loose throw rugs in walkways, pet beds, electrical cords, etc? Yes  Adequate lighting in your home to reduce risk of falls? Yes    ASSISTIVE DEVICES UTILIZED TO PREVENT FALLS:  Life alert? Yes Use of a cane, walker or w/c? Yes  Grab bars in the bathroom? Yes  Shower chair or bench in shower? Yes  Elevated toilet seat or  a handicapped toilet? No   DME ORDERS:  DME order needed?  No   TIMED UP AND GO:  Was the test performed? No . Telephonic visit.   Education: Fall risk prevention has been discussed.  Intervention(s) required? No   Depression Screen PHQ 2/9 Scores 10/13/2019 06/21/2019 12/16/2018 06/29/2018  PHQ - 2 Score 0 0 0 0  PHQ- 9 Score - 0 - 0     Cognitive Function     6CIT Screen 10/13/2019 06/29/2018 06/27/2017  What Year? 0 points 0 points 0 points  What month? 0 points 0 points 0 points  What time? 0 points 0 points 0 points  Count back from 20 0 points 0 points 0 points  Months in reverse 0 points 0 points 0 points  Repeat phrase 0 points 0 points 2 points  Total Score 0 0 2    Immunization History  Administered Date(s) Administered   Fluad Quad(high Dose 65+) 10/12/2019   Influenza, High Dose Seasonal PF 09/23/2017, 09/10/2018   Influenza,inj,Quad PF,6+ Mos 10/16/2016   Influenza-Unspecified 09/02/2015   Pneumococcal Conjugate-13 09/08/2014   Pneumococcal Polysaccharide-23 09/14/1994, 04/30/1997   Pneumococcal-Unspecified 09/14/1994   Tdap 06/09/2011   Zoster 05/12/2006    Qualifies for Shingles Vaccine? Yes  Zostavax completed 2007. Due for Shingrix. Education has been provided regarding the importance of this vaccine. Pt has been advised to call insurance company to determine out of pocket expense. Advised may also receive vaccine at local pharmacy or Health Dept. Verbalized acceptance and understanding.  Tdap: Up to date  Flu Vaccine: Up to date  Pneumococcal Vaccine: Up to date   Screening Tests Health Maintenance  Topic Date Due   TETANUS/TDAP  06/08/2021   INFLUENZA VACCINE  Completed   DEXA SCAN  Completed   PNA vac Low Risk Adult  Completed     Cancer Screenings:  Colorectal Screening: No longer required.   Mammogram: Completed 06/19/17. Current breast cancer patient.  Bone Density: Completed 09/16/19. Results reflect OSTEOPENIA. Repeat every 2 years.   Lung Cancer Screening: (Low Dose CT Chest recommended if Age 63-80 years, 30 pack-year currently smoking OR have quit w/in 15years.) does not qualify.    Additional Screening:  Hepatitis C Screening: no longer required  Vision Screening: Recommended annual ophthalmology exams for early detection of glaucoma and other disorders of the eye. Is the patient up to date with their annual eye exam?  Yes  Who is the provider or what is the name of the office in which the pt attends annual eye exams? [place] Screening: Recommended annual dental exams for proper oral hygiene  Community Resource Referral:  CRR required this visit?  No      Plan:     I have personally reviewed and addressed the Medicare Annual Wellness questionnaire and have noted the following in the patients chart:  A. Medical and social history B. Use of alcohol, tobacco or illicit drugs  C. Current medications and supplements D. Functional ability and status E.  Nutritional status F.  Physical activity G. Advance directives H. List of other physicians I.  Hospitalizations, surgeries, and ER visits in previous 12 months J.  [place] such as hearing and vision if needed, cognitive and depression L. Referrals and appointments   In addition, I have reviewed and discussed with patient certain preventive protocols, quality metrics, and best practice recommendations. A written personalized care plan for preventive services as well as general preventive health recommendations were provided  to patient.   Signed,  Clemetine Marker, LPN Nurse Health Advisor   Nurse Notes: none

## 2019-10-13 NOTE — Patient Instructions (Signed)
Kelli Williams , Thank you for taking time to come for your Medicare Wellness Visit. I appreciate your ongoing commitment to your health goals. Please review the following plan we discussed and let me know if I can assist you in the future.   Screening recommendations/referrals: Colonoscopy: no longer required  Mammogram: done 06/19/17. No longer required.  Bone Density: done 09/16/19 Recommended yearly ophthalmology/optometry visit for glaucoma screening and checkup Recommended yearly dental visit for hygiene and checkup  Vaccinations: Influenza vaccine: done 10/12/19 Pneumococcal vaccine: done 09/08/14 Tdap vaccine: done 06/09/11 Shingles vaccine: Shingrix discussed. Please contact your pharmacy for coverage information.   Conditions/risks identified: Keep up the great work!  Next appointment: Please follow up in one year for your Medicare Annual Wellness visit.    Preventive Care 18 Years and Older, Female Preventive care refers to lifestyle choices and visits with your health care provider that can promote health and wellness. What does preventive care include?  A yearly physical exam. This is also called an annual well check.  Dental exams once or twice a year.  Routine eye exams. Ask your health care provider how often you should have your eyes checked.  Personal lifestyle choices, including:  Daily care of your teeth and gums.  Regular physical activity.  Eating a healthy diet.  Avoiding tobacco and drug use.  Limiting alcohol use.  Practicing safe sex.  Taking low-dose aspirin every day.  Taking vitamin and mineral supplements as recommended by your health care provider. What happens during an annual well check? The services and screenings done by your health care provider during your annual well check will depend on your age, overall health, lifestyle risk factors, and family history of disease. Counseling  Your health care provider may ask you questions about your:   Alcohol use.  Tobacco use.  Drug use.  Emotional well-being.  Home and relationship well-being.  Sexual activity.  Eating habits.  History of falls.  Memory and ability to understand (cognition).  Work and work Statistician.  Reproductive health. Screening  You may have the following tests or measurements:  Height, weight, and BMI.  Blood pressure.  Lipid and cholesterol levels. These may be checked every 5 years, or more frequently if you are over 8 years old.  Skin check.  Lung cancer screening. You may have this screening every year starting at age 92 if you have a 30-pack-year history of smoking and currently smoke or have quit within the past 15 years.  Fecal occult blood test (FOBT) of the stool. You may have this test every year starting at age 71.  Flexible sigmoidoscopy or colonoscopy. You may have a sigmoidoscopy every 5 years or a colonoscopy every 10 years starting at age 86.  Hepatitis C blood test.  Hepatitis B blood test.  Sexually transmitted disease (STD) testing.  Diabetes screening. This is done by checking your blood sugar (glucose) after you have not eaten for a while (fasting). You may have this done every 1-3 years.  Bone density scan. This is done to screen for osteoporosis. You may have this done starting at age 45.  Mammogram. This may be done every 1-2 years. Talk to your health care provider about how often you should have regular mammograms. Talk with your health care provider about your test results, treatment options, and if necessary, the need for more tests. Vaccines  Your health care provider may recommend certain vaccines, such as:  Influenza vaccine. This is recommended every year.  Tetanus, diphtheria, and  acellular pertussis (Tdap, Td) vaccine. You may need a Td booster every 10 years.  Zoster vaccine. You may need this after age 6.  Pneumococcal 13-valent conjugate (PCV13) vaccine. One dose is recommended after age  26.  Pneumococcal polysaccharide (PPSV23) vaccine. One dose is recommended after age 52. Talk to your health care provider about which screenings and vaccines you need and how often you need them. This information is not intended to replace advice given to you by your health care provider. Make sure you discuss any questions you have with your health care provider. Document Released: 12/22/2015 Document Revised: 08/14/2016 Document Reviewed: 09/26/2015 Elsevier Interactive Patient Education  2017 [place] Prevention in the Home Falls can cause injuries. They can happen to people of all ages. There are many things you can do to make your home safe and to help prevent falls. What can I do on the outside of my home?  Regularly fix the edges of walkways and driveways and fix any cracks.  Remove anything that might make you trip as you walk through a door, such as a raised step or threshold.  Trim any bushes or trees on the path to your home.  Use bright outdoor lighting.  Clear any walking paths of anything that might make someone trip, such as rocks or tools.  Regularly check to see if handrails are loose or broken. Make sure that both sides of any steps have handrails.  Any raised decks and porches should have guardrails on the edges.  Have any leaves, snow, or ice cleared regularly.  Use sand or salt on walking paths during winter.  Clean up any spills in your garage right away. This includes oil or grease spills. What can I do in the bathroom?  Use night lights.  Install grab bars by the toilet and in the tub and shower. Do not use towel bars as grab bars.  Use non-skid mats or decals in the tub or shower.  If you need to sit down in the shower, use a plastic, non-slip stool.  Keep the floor dry. Clean up any water that spills on the floor as soon as it happens.  Remove soap buildup in the tub or shower regularly.  Attach bath mats securely with double-sided  non-slip rug tape.  Do not have throw rugs and other things on the floor that can make you trip. What can I do in the bedroom?  Use night lights.  Make sure that you have a light by your bed that is easy to reach.  Do not use any sheets or blankets that are too big for your bed. They should not hang down onto the floor.  Have a firm chair that has side arms. You can use this for support while you get dressed.  Do not have throw rugs and other things on the floor that can make you trip. What can I do in the kitchen?  Clean up any spills right away.  Avoid walking on wet floors.  Keep items that you use a lot in easy-to-reach places.  If you need to reach something above you, use a strong step stool that has a grab bar.  Keep electrical cords out of the way.  Do not use floor polish or wax that makes floors slippery. If you must use wax, use non-skid floor wax.  Do not have throw rugs and other things on the floor that can make you trip. What can I do with my stairs?  Do not leave any items on the stairs.  Make sure that there are handrails on both sides of the stairs and use them. Fix handrails that are broken or loose. Make sure that handrails are as long as the stairways.  Check any carpeting to make sure that it is firmly attached to the stairs. Fix any carpet that is loose or worn.  Avoid having throw rugs at the top or bottom of the stairs. If you do have throw rugs, attach them to the floor with carpet tape.  Make sure that you have a light switch at the top of the stairs and the bottom of the stairs. If you do not have them, ask someone to add them for you. What else can I do to help prevent falls?  Wear shoes that:  Do not have high heels.  Have rubber bottoms.  Are comfortable and fit you well.  Are closed at the toe. Do not wear sandals.  If you use a stepladder:  Make sure that it is fully opened. Do not climb a closed stepladder.  Make sure that both  sides of the stepladder are locked into place.  Ask someone to hold it for you, if possible.  Clearly mark and make sure that you can see:  Any grab bars or handrails.  First and last steps.  Where the edge of each step is.  Use tools that help you move around (mobility aids) if they are needed. These include:  Canes.  Walkers.  Scooters.  Crutches.  Turn on the lights when you go into a dark area. Replace any light bulbs as soon as they burn out.  Set up your furniture so you have a clear path. Avoid moving your furniture around.  If any of your floors are uneven, fix them.  If there are any pets around you, be aware of where they are.  Review your medicines with your doctor. Some medicines can make you feel dizzy. This can increase your chance of falling. Ask your doctor what other things that you can do to help prevent falls. This information is not intended to replace advice given to you by your health care provider. Make sure you discuss any questions you have with your health care provider. Document Released: 09/21/2009 Document Revised: 05/02/2016 Document Reviewed: 12/30/2014 Elsevier Interactive Patient Education  2017 [employer].

## 2019-10-17 ENCOUNTER — Encounter: Payer: Self-pay | Admitting: Hematology and Oncology

## 2019-10-20 ENCOUNTER — Encounter: Payer: Medicare Other | Admitting: Hospice and Palliative Medicine

## 2019-11-02 ENCOUNTER — Ambulatory Visit: Payer: Medicare Other

## 2019-11-09 ENCOUNTER — Other Ambulatory Visit: Payer: Self-pay | Admitting: Hematology and Oncology

## 2019-11-09 DIAGNOSIS — C7951 Secondary malignant neoplasm of bone: Secondary | ICD-10-CM

## 2019-11-09 DIAGNOSIS — C50412 Malignant neoplasm of upper-outer quadrant of left female breast: Secondary | ICD-10-CM

## 2019-11-09 DIAGNOSIS — Z170 Estrogen receptor positive status [ER+]: Secondary | ICD-10-CM

## 2019-11-09 NOTE — Progress Notes (Signed)
[facility]  [address], [address] [place], [place] [address] Phone: [phone]  Fax: [phone]   Clinic Day:  11/10/2019  Referring physician: Glean Hess, MD  Chief Complaint: Kelli Williams is a 85 y.o. female with metastatic breast cancer who is seen for 2 month assessment and continuation of monthlyFaslodex.  HPI: The patient was last seen in the medical oncology clinic on 07/19/2019. At that time, she was doing well.  She noted no concerns except for weight gain.  Exam was stable. Her Xgeva remained on permanent hold secondary to chronic dental issues. CA 27.29 was normal. She received Faslodex. She continued calcium and vitamin D.   Patient received monthly Faslodex (08/17/2019 - 10/12/2019).   Patient was seen in cardiology with Dr. Rockey Situ on 09/07/2019. She felt well. She had rare palpitations. She was off propranolol for low heart rate. Her BP was well controlled. She continued Zetia 10 mg daily.   Bone density on 09/16/2019 revealed osteopenia with a T-score of -1.9 at the forearm radius.   She was seen by Beckey Rutter, NP on 10/12/2019.  At that time, she was stable. She continued to have chronic pain primarily in her shoulders. Delton See was held secondary to chronic dental issues. CBC revealed a hematocrit of 37.3, hemoglobin 12.5, MCV 86.7, platelets 218,000, and white count 5500. CA 27.29 was 20.6. She received Faslodex.  PET scan was ordered secondary to chronic pain.  PET scan was denied.  During the interim, she has felt "ok".  She notes having several dentist appointments at [facility] that consisted of a dental extraction and a teeth cleaning. She has had some leg swelling. She notes some back pain.  She has left knee pain and bilateral hip pain.    Past Medical History:  Diagnosis Date  . Anxiety   . Arthritis    BOTH FEET AND KNEES  . Breast cancer ([place]) 2000   left breast ca with lumpectomy and rad tx 5 yr tamoxifen/Dr Jeannine Kitten at  [place]  . Complication of anesthesia    WOKE UP DURING SURGERY FOR DEVIATED SEPTUM, KNEE REPLACEMENT AND BREAST LUMPECTOMY  . Dysrhythmia   . GERD (gastroesophageal reflux disease)   . Hypertension   . Neuropathic pain of both legs   . Neuropathy   . Personal history of chemotherapy   . Personal history of radiation therapy   . Pneumonia 2016  . Skin cancer of forehead   . Skin cancer of nose     Past Surgical History:  Procedure Laterality Date  . ABDOMINAL HYSTERECTOMY    . AXILLARY LYMPH NODE BIOPSY Left 07/10/2017   INVASIVE MAMMARY CARCINOMA WITH FOCAL LOBULAR FEATURES.   Marland Kitchen BREAST BIOPSY Bilateral 1960   benign  . BREAST LUMPECTOMY Left 2000   breast ca with rad tx  . BREAST LUMPECTOMY Left 08/20/2017  . BREAST LUMPECTOMY Left 08/20/2017   Procedure: left breast wide excision;  Surgeon: Robert Bellow, MD;  Location: [facility] ORS;  Service: General;  Laterality: Left;  . CATARACT EXTRACTION     left eye  . CHOLECYSTECTOMY    . JOINT REPLACEMENT    . NASAL SEPTUM SURGERY    . PARTIAL KNEE ARTHROPLASTY     right  . TOTAL VAGINAL HYSTERECTOMY      Family History  Problem Relation Age of Onset  . Heart disease Mother   . Heart attack Paternal Uncle   . Breast cancer Daughter 22       genetic  negative  . Cancer Paternal Grandmother     Social History:  reports that she has never smoked. She has never used smokeless tobacco. She reports that she does not drink alcohol or use drugs. The patient's husband died 2 years ago.She was born outside of [place].She previously worked as an Web designer. She lives at the [facility] at [place]. She is independent except for the use of her walker. Patient has a daughter, Kelli Williams. The patient is alone today.  Allergies:  Allergies  Allergen Reactions  . Amitriptyline Hives  . Amoxicillin     Diarrhea    . Black Pepper [[place]]   . Ivp Dye [Iodinated Diagnostic Agents]   . Tape Other (See Comments)    [place]  - please use paper tape.  . Sulfa Antibiotics Rash  . Sulfa Antibiotics Rash    Current Medications: Current Outpatient Medications  Medication Sig Dispense Refill  . acetaminophen (TYLENOL) 325 MG tablet Take 650 mg by mouth every 6 (six) hours as needed.    Marland Kitchen aspirin 81 MG tablet Take 1 tablet by mouth daily.    . Calcium-Magnesium 500-250 MG TABS Take 1 tablet by mouth daily.     . Cholecalciferol (VITAMIN D3) 25 MCG (1000 UT) CAPS Take 1 capsule by mouth daily.     . clindamycin (CLEOCIN) 300 MG capsule Take 300 mg by mouth once. Prior to dental appointment    . Coenzyme Q10 (CO Q-10) 100 MG CAPS Take 1 tablet by mouth daily.     Marland Kitchen ezetimibe (ZETIA) 10 MG tablet TAKE 1 TABLET BY MOUTH  DAILY 90 tablet 3  . gabapentin (NEURONTIN) 300 MG capsule TAKE 3 CAPSULES BY MOUTH  TWICE DAILY 540 capsule 3  . lisinopril-hydrochlorothiazide (ZESTORETIC) 20-25 MG tablet TAKE 1 AND 1/2 TABLETS BY  MOUTH DAILY 135 tablet 3  . meloxicam (MOBIC) 15 MG tablet TAKE 1 TABLET BY MOUTH  DAILY 90 tablet 3  . Multiple Vitamins-Minerals (MULTIVITAMIN WITH MINERALS) tablet Take 1 tablet by mouth daily.    Marland Kitchen omeprazole (PRILOSEC) 20 MG capsule TAKE 1 CAPSULE BY MOUTH  DAILY 90 capsule 3  . traMADol (ULTRAM) 50 MG tablet TAKE 1/2 TO 1 TABLET BY  MOUTH EVERY 8 HOURS AS  NEEDED FOR PAIN 90 tablet 0   No current facility-administered medications for this visit.     Review of Systems  Constitutional: Positive for weight loss (15 pounds since 07/19/2019). Negative for chills, diaphoresis, fever and malaise/fatigue.       Feels "ok".  HENT: Negative for congestion, ear pain, nosebleeds, sinus pain and sore throat.        Dental extraction.  Eyes: Negative.  Negative for blurred vision, double vision, photophobia and pain.  Respiratory: Negative.  Negative for cough, hemoptysis, sputum production and shortness of breath.   Cardiovascular: Positive for leg swelling (chronic). Negative for palpitations and PND.   Gastrointestinal: Negative.  Negative for abdominal pain, blood in stool, constipation, diarrhea, melena, nausea and vomiting.  Genitourinary: Negative.  Negative for dysuria, frequency, hematuria and urgency.  Musculoskeletal: Positive for back pain (chronic) and joint pain (chronic LEFT knee "bone on bone" and BILATERAL hip pain). Negative for falls, myalgias and neck pain.  Skin: Negative.  Negative for itching and rash.  Neurological: Negative for dizziness, tremors, sensory change (stable neuropathy on gabapentin), speech change, focal weakness, weakness and headaches.       Chronic balance issues.  Endo/Heme/Allergies: Does not bruise/bleed easily.       Allergy  to pepper.  Psychiatric/Behavioral: Negative.  Negative for depression and memory loss. The patient is not nervous/anxious and does not have insomnia.   All other systems reviewed and are negative.  Performance status (ECOG):  1-2  Vitals Blood pressure 136/68, pulse 63, temperature 97.8 F (36.6 C), temperature source Oral, resp. rate 18, weight 200 lb 11.7 oz (91 kg), SpO2 95 %.   Physical Exam  Constitutional: She is oriented to person, place, and time. Vital signs are normal. She appears well-developed and well-nourished. No distress.  Elderly woman sitting comfortably in the exam room in no acute distress.  She has a rolling walker at her side.  She is examined in her chair.  HENT:  Head: Normocephalic and atraumatic.  Mouth/Throat: Oropharynx is clear and moist. No oropharyngeal exudate.  Long gray hair.  Mask.  Eyes: Pupils are equal, round, and reactive to light. Conjunctivae and EOM are normal. No scleral icterus.  Glasses. Right eye irritated.  Neck: Normal range of motion. Neck supple. No JVD present.  Cardiovascular: Normal rate, regular rhythm and normal heart sounds. Exam reveals no gallop.  No murmur heard. Pulmonary/Chest: Effort normal and breath sounds normal. No respiratory distress. She has no wheezes.  She has no rales. She exhibits no tenderness. Tenderness: 1 cm above incision medially.  Post operative and post radiation changes left breast without discrete mass or skin changes.  Abdominal: Soft. Normal appearance and bowel sounds are normal. She exhibits no distension and no mass. There is no abdominal tenderness. There is no rebound and no guarding.  Musculoskeletal:        General: Tenderness (lower extremities) and edema (chronic) present. Normal range of motion.     Cervical back: Normal range of motion and neck supple.     Thoracic back: Tenderness and bony tenderness present.     Lumbar back: Tenderness and bony tenderness present.  Lymphadenopathy:       Head (right side): No preauricular, no posterior auricular and no occipital adenopathy present.       Head (left side): No preauricular, no posterior auricular and no occipital adenopathy present.    She has no cervical adenopathy.    She has no axillary adenopathy.       Right: No inguinal and no supraclavicular adenopathy present.       Left: No inguinal and no supraclavicular adenopathy present.  Neurological: She is alert and oriented to person, place, and time.  Skin: Skin is warm and dry. No rash noted. She is not diaphoretic. No erythema. No pallor.  Psychiatric: She has a normal mood and affect. Her behavior is normal. Judgment and thought content normal.  Nursing note and vitals reviewed.   Imaging studies: 09/16/2017: PET scanrevealed widespread scattered hypermetabolic lytic osseous metastasesthroughout the axial and proximal appendicular skeleton. Bilateral proximal femoral skeletal metastases placed the patient at risk for pathologic hip fractures. There was mild hypermetabolic right hilar lymphadenopathy.  09/23/2017: Plain films of the bilateral femursrevealed no pathologic fracture. The LEFT view showed lytic subtrochanteric metastasis that was previously seen on PET scan.  06/01/2018: PET  scandemonstrated marked improvementin the osseous metastatic disease. Previously demonstrated areas of abnormally high metabolic activity have resolved, with the exception of a very faint area of residual activity (SUV 2.6; previously 11.5) in the LEFT subtrochanteric region. Previous LEFT axillary fluid collection now shows a 1.8 x 1.2 cm scarlike density with low-grade activity (SUV 2.7). Incidental findings include a small hiatal hernia and diverticulosis of the sigmoid colon.  02/11/2018: Lumbar spine MRI revealed multifocal lumbosacral vertebral body signal abnormalities consistent with osseous metastatic disease. The largest lesion was at L1. There was no pathologic compression fracture. There was multi-level moderate to severe neural foraminal stenosis, which had progressed since the prior study. 09/26/2018: Sacral MRI revealed scattered small marrow lesionsc/wmetastatic disease.The0.6 cm lesion in the S1 vertebral bodywasnew since the comparison lumbar spine MRI. Small lesion in the left ilium adjacent to the SI joint was present on the prior MRI. The remaining lesions werenot included on the prior MRI. There was nofracture or other acute abnormality.There was lower lumbar spondylosisand diverticulosis. 10/15/2018: PET scanrevealed no evidence of active breast cancer metastasis on FDG PET scan. There were stable lytic lesions in the spine without evidence of metabolic activity consistent treated metastasis. 05/20/2019:PET scanrevealed no evidence of breast cancer recurrence or progression.There were stable lytic lesions in the spine without associated metabolic activity.CT datawasdegraded by patient motion. 09/16/2019:  Bone density revealed osteopenia with a T-score of -1.9 at the forearm radius.    Appointment on 11/10/2019  Component Date Value Ref Range Status  . Sodium 11/10/2019 137  135 - 145 mmol/L Final  . Potassium 11/10/2019 3.9  3.5 - 5.1 mmol/L Final  .  Chloride 11/10/2019 100  98 - 111 mmol/L Final  . CO2 11/10/2019 28  22 - 32 mmol/L Final  . Glucose, Bld 11/10/2019 99  70 - 99 mg/dL Final  . BUN 11/10/2019 30* 8 - 23 mg/dL Final  . Creatinine, Ser 11/10/2019 1.09* 0.44 - 1.00 mg/dL Final  . Calcium 11/10/2019 9.1  8.9 - 10.3 mg/dL Final  . Total Protein 11/10/2019 7.3  6.5 - 8.1 g/dL Final  . Albumin 11/10/2019 3.9  3.5 - 5.0 g/dL Final  . AST 11/10/2019 20  15 - 41 U/L Final  . ALT 11/10/2019 12  0 - 44 U/L Final  . Alkaline Phosphatase 11/10/2019 85  38 - 126 U/L Final  . Total Bilirubin 11/10/2019 0.4  0.3 - 1.2 mg/dL Final  . GFR calc non Af Amer 11/10/2019 44* >60 mL/min Final  . GFR calc Af Amer 11/10/2019 51* >60 mL/min Final  . Anion gap 11/10/2019 9  5 - 15 Final   Performed at [facility], [address] [place], [place] [address]  . WBC 11/10/2019 6.7  4.0 - 10.5 K/uL Final  . RBC 11/10/2019 4.25  3.87 - 5.11 MIL/uL Final  . Hemoglobin 11/10/2019 12.3  12.0 - 15.0 g/dL Final  . HCT 11/10/2019 36.9  36.0 - 46.0 % Final  . MCV 11/10/2019 86.8  80.0 - 100.0 fL Final  . MCH 11/10/2019 28.9  26.0 - 34.0 pg Final  . MCHC 11/10/2019 33.3  30.0 - 36.0 g/dL Final  . RDW 11/10/2019 14.6  11.5 - 15.5 % Final  . Platelets 11/10/2019 211  150 - 400 K/uL Final  . nRBC 11/10/2019 0.0  0.0 - 0.2 % Final  . Neutrophils Relative % 11/10/2019 66  % Final  . Neutro Abs 11/10/2019 4.5  1.7 - 7.7 K/uL Final  . Lymphocytes Relative 11/10/2019 19  % Final  . Lymphs Abs 11/10/2019 1.3  0.7 - 4.0 K/uL Final  . Monocytes Relative 11/10/2019 8  % Final  . Monocytes Absolute 11/10/2019 0.5  0.1 - 1.0 K/uL Final  . Eosinophils Relative 11/10/2019 6  % Final  . Eosinophils Absolute 11/10/2019 0.4  0.0 - 0.5 K/uL Final  . Basophils Relative 11/10/2019 1  % Final  .  Basophils Absolute 11/10/2019 0.1  0.0 - 0.1 K/uL Final  . Immature Granulocytes 11/10/2019 0  % Final  . Abs Immature Granulocytes 11/10/2019 0.03  0.00 - 0.07 K/uL  Final   Performed at [facility], [address] [place], [place] [address]    Assessment:  Kelli Williams is a 85 y.o. female with metastatic breast cancer s/p left axillary biopsy on 07/10/2017. Pathology revealed a 1.1 cm grade II invasive mammary carcinoma with focal lobular features. Wide excisionon 08/20/2017 revealed metastatic mammary carcinoma involving 1 of 4 axillary lymph nodes with extracapsular extension. The superior medial margin was involved. Tumor was ER + (>90%), PR + (>90%), and Her2/neu 2+ (FISH -).   Inv[facility]ae genetic testingon 09/26/2017 revealed no pathogenic sequence variants or deletions/duplications. BRCA1/2 testing on 09/26/2017 was negative.  She has a history of left breast cancers/p wide excisionand sentinel lymph node biopsy on 07/18/1999 at [facility]. Pathology revealed a 0.8 x 0.6 x 0.4 cm grade II invasive ductal adenocarcinoma. There was no lymphovascular invasion. 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 Margins were negative. Tumor was ER + (50%) and PR + (90%). Pathologic stagewas T1bN0. She received 6200 cGyfrom 08/21/1999 - 10/03/1999. She completed 5 years of tamoxifenin 06/2004.   PET scanon 09/16/2017 revealed widespread scattered hypermetabolic lytic osseous metastases throughout the axial and proximal appendicular skeleton. Bilateral proximal femoral skeletal metastases placed the patient at risk for pathologic hip fractures. There was mild hypermetabolic right hilar lymphadenopathy. Plain films of the bilateral femurson 09/23/2017 revealed no pathologic fracture. The LEFT view showed lytic subtrochanteric metastasis that was previously seen on PET scan.   PET scan on 05/20/2019 revealedno evidence of breast cancer recurrence or progression.There were stable lytic lesions in the spine without associated metabolic activity.CT datawasdegraded by patient motion.  CA27.29has been followed: 14.7 on  02/05/2017, 19.1 on 02/06/2012, 172.2 on 09/04/2017, 144.9 on 11/06/2017, 73.4 on 02/05/2018, 63.3 on 03/06/2018, 47.2 on 04/06/2018, 42.4 on 05/07/2018, 36.6 on 06/08/2018, 34.6 on 07/06/2018, 27.1 on 08/06/2018, 28.2 on 09/10/2018, 26.0 on 10/09/2018, 30.9 on 11/09/2018, 27.7 on 12/07/2018, 35.8 on 01/04/2019, 24.8 on 02/01/2019, 24.2 on 04/26/2019, 21.6 on 05/24/2019, 24.5 on 07/19/2019, 20.6 on 10/12/2019, and 18.5 on 11/10/2019.  She received a palliative course of radiation(3000 cGy in 10 fractions) to the left hip from 10/14/2017 - 10/27/2017. She received a course of palliative radiationthe the lumbar spine and SI joints from 03/04/2018 - 03/27/2018.  She began Faslodexon 09/26/2017 (last 10/12/2019). She began monthly Xgevaon 09/26/2017 (last 05/07/2018). She has dental issues.  Bone densityon 09/01/2017 revealed osteopeniawith a T-score of -1.9 in the right femoral neck and left forearm. Bone density on 09/16/2019 revealed osteopenia with a T-score of -1.9 at the forearm radius.  She is on calcium and vitamin D.  Symptomatically, she notes chronic lower back pain.  She has had a dental extraction.  Exam is stable.  Plan: 1.  Labs today: CBC with diff, MP, CA 27.29.  2.Metastatic breast cancer Clinically, she continues to do well.   Exam is stable.   CA 27.29 is 18.5 (normal).   PET scan on 05/20/2019 revealed no evidence of progression.  Follow-up PET scan was denied. Discuss consideration of bone scan.  Patient in agreement. Continue Faslodex today and monthly. 3.Bone metastasis Patient had no evidence of metastatic disease on PET scan from 05/20/2019.   Discuss obtaining bone scan as new baseline secondary to PET denial. Hold Xgeva secondary to dental extraction.  Review need for dental clearance (  letter).   4.Osteopenia Review bone density on 09/16/2019.  Stable osteopenia. Continue calcium and vitamin D. 5.   Dental issues Patient has established  care with [facility] dentistry.   Patient is s/p recent dental extraction. Await clearance letter prior to future Xgeva. 6.   Faslodex today.   7.   Schedule bone scan. 8.   RTC monthly x 2 for Faslodex.  9.   RTC in 3 months for MD assessment, labs (CBC with diff, CMP, CA27.29), review of bone scan, and Faslodex.    I discussed the assessment and treatment plan with the patient.  The patient was provided an opportunity to ask questions and all were answered.  The patient agreed with the plan and demonstrated an understanding of the instructions.  The patient was advised to call back if the symptoms worsen or if the condition fails to improve as anticipated.  I provided 18 minutes of face-to-face time during this this encounter and > 50% was spent counseling as documented under my assessment and plan.    Lequita Asal, MD, PhD    11/10/2019, 10:44 AM  I, Selena Batten, am acting as scribe for [employer]. Mike Gip, MD, PhD.  I, Melissa C. Mike Gip, MD, have reviewed the above documentation for accuracy and completeness, and I agree with the above.

## 2019-11-10 ENCOUNTER — Inpatient Hospital Stay: Payer: Medicare Other

## 2019-11-10 ENCOUNTER — Other Ambulatory Visit: Payer: Self-pay

## 2019-11-10 ENCOUNTER — Inpatient Hospital Stay: Payer: Medicare Other | Attending: Hematology and Oncology | Admitting: Hematology and Oncology

## 2019-11-10 VITALS — BP 136/68 | HR 63 | Temp 97.8°F | Resp 18 | Wt 200.7 lb

## 2019-11-10 DIAGNOSIS — M85851 Other specified disorders of bone density and structure, right thigh: Secondary | ICD-10-CM | POA: Diagnosis not present

## 2019-11-10 DIAGNOSIS — C7951 Secondary malignant neoplasm of bone: Secondary | ICD-10-CM | POA: Insufficient documentation

## 2019-11-10 DIAGNOSIS — Z7189 Other specified counseling: Secondary | ICD-10-CM | POA: Diagnosis not present

## 2019-11-10 DIAGNOSIS — C50412 Malignant neoplasm of upper-outer quadrant of left female breast: Secondary | ICD-10-CM | POA: Insufficient documentation

## 2019-11-10 DIAGNOSIS — Z170 Estrogen receptor positive status [ER+]: Secondary | ICD-10-CM | POA: Diagnosis not present

## 2019-11-10 DIAGNOSIS — Z5111 Encounter for antineoplastic chemotherapy: Secondary | ICD-10-CM | POA: Diagnosis not present

## 2019-11-10 LAB — CBC WITH DIFFERENTIAL/PLATELET
Abs Immature Granulocytes: 0.03 10*3/uL (ref 0.00–0.07)
Basophils Absolute: 0.1 10*3/uL (ref 0.0–0.1)
Basophils Relative: 1 %
Eosinophils Absolute: 0.4 10*3/uL (ref 0.0–0.5)
Eosinophils Relative: 6 %
HCT: 36.9 % (ref 36.0–46.0)
Hemoglobin: 12.3 g/dL (ref 12.0–15.0)
Immature Granulocytes: 0 %
Lymphocytes Relative: 19 %
Lymphs Abs: 1.3 10*3/uL (ref 0.7–4.0)
MCH: 28.9 pg (ref 26.0–34.0)
MCHC: 33.3 g/dL (ref 30.0–36.0)
MCV: 86.8 fL (ref 80.0–100.0)
Monocytes Absolute: 0.5 10*3/uL (ref 0.1–1.0)
Monocytes Relative: 8 %
Neutro Abs: 4.5 10*3/uL (ref 1.7–7.7)
Neutrophils Relative %: 66 %
Platelets: 211 10*3/uL (ref 150–400)
RBC: 4.25 MIL/uL (ref 3.87–5.11)
RDW: 14.6 % (ref 11.5–15.5)
WBC: 6.7 10*3/uL (ref 4.0–10.5)
nRBC: 0 % (ref 0.0–0.2)

## 2019-11-10 LAB — COMPREHENSIVE METABOLIC PANEL
ALT: 12 U/L (ref 0–44)
AST: 20 U/L (ref 15–41)
Albumin: 3.9 g/dL (ref 3.5–5.0)
Alkaline Phosphatase: 85 U/L (ref 38–126)
Anion gap: 9 (ref 5–15)
BUN: 30 mg/dL — ABNORMAL HIGH (ref 8–23)
CO2: 28 mmol/L (ref 22–32)
Calcium: 9.1 mg/dL (ref 8.9–10.3)
Chloride: 100 mmol/L (ref 98–111)
Creatinine, Ser: 1.09 mg/dL — ABNORMAL HIGH (ref 0.44–1.00)
GFR calc Af Amer: 51 mL/min — ABNORMAL LOW (ref >60)
GFR calc non Af Amer: 44 mL/min — ABNORMAL LOW (ref >60)
Glucose, Bld: 99 mg/dL (ref 70–99)
Potassium: 3.9 mmol/L (ref 3.5–5.1)
Sodium: 137 mmol/L (ref 135–145)
Total Bilirubin: 0.4 mg/dL (ref 0.3–1.2)
Total Protein: 7.3 g/dL (ref 6.5–8.1)

## 2019-11-10 MED ORDER — FULVESTRANT 250 MG/5ML IM SOLN
500.0000 mg | Freq: Once | INTRAMUSCULAR | Status: AC
  Administered 2019-11-10: 500 mg via INTRAMUSCULAR

## 2019-11-10 NOTE — Progress Notes (Signed)
Patient here for follow up. Denies any concerns.  

## 2019-11-11 LAB — CANCER ANTIGEN 27.29: CA 27.29: 18.5 U/mL (ref 0.0–38.6)

## 2019-11-19 ENCOUNTER — Other Ambulatory Visit: Payer: Self-pay

## 2019-11-19 ENCOUNTER — Ambulatory Visit
Admission: RE | Admit: 2019-11-19 | Discharge: 2019-11-19 | Disposition: A | Discharge status: Home / Self Care | Payer: Medicare Other | Source: Ambulatory Visit | Attending: Hematology and Oncology | Admitting: Hematology and Oncology

## 2019-11-19 DIAGNOSIS — C50919 Malignant neoplasm of unspecified site of unspecified female breast: Secondary | ICD-10-CM | POA: Diagnosis not present

## 2019-11-19 DIAGNOSIS — Z170 Estrogen receptor positive status [ER+]: Secondary | ICD-10-CM | POA: Diagnosis not present

## 2019-11-19 DIAGNOSIS — C7951 Secondary malignant neoplasm of bone: Secondary | ICD-10-CM | POA: Diagnosis not present

## 2019-11-19 DIAGNOSIS — C50412 Malignant neoplasm of upper-outer quadrant of left female breast: Secondary | ICD-10-CM | POA: Diagnosis not present

## 2019-11-19 MED ORDER — TECHNETIUM TC 99M MEDRONATE IV KIT
21.6080 | PACK | Freq: Once | INTRAVENOUS | Status: AC | PRN
  Administered 2019-11-19: 21.608 via INTRAVENOUS

## 2019-12-07 ENCOUNTER — Other Ambulatory Visit: Payer: Self-pay

## 2019-12-08 ENCOUNTER — Other Ambulatory Visit: Payer: Self-pay

## 2019-12-08 ENCOUNTER — Inpatient Hospital Stay: Payer: Medicare Other

## 2019-12-08 DIAGNOSIS — Z5111 Encounter for antineoplastic chemotherapy: Secondary | ICD-10-CM | POA: Diagnosis not present

## 2019-12-08 DIAGNOSIS — C50412 Malignant neoplasm of upper-outer quadrant of left female breast: Secondary | ICD-10-CM | POA: Diagnosis not present

## 2019-12-08 DIAGNOSIS — C7951 Secondary malignant neoplasm of bone: Secondary | ICD-10-CM | POA: Diagnosis not present

## 2019-12-08 DIAGNOSIS — M85851 Other specified disorders of bone density and structure, right thigh: Secondary | ICD-10-CM

## 2019-12-08 MED ORDER — FULVESTRANT 250 MG/5ML IM SOLN
500.0000 mg | Freq: Once | INTRAMUSCULAR | Status: AC
  Administered 2019-12-08: 12:00:00 500 mg via INTRAMUSCULAR
  Filled 2019-12-08: qty 10

## 2019-12-23 ENCOUNTER — Ambulatory Visit (INDEPENDENT_AMBULATORY_CARE_PROVIDER_SITE_OTHER): Payer: Medicare Other | Admitting: Internal Medicine

## 2019-12-23 ENCOUNTER — Encounter: Payer: Self-pay | Admitting: Internal Medicine

## 2019-12-23 ENCOUNTER — Other Ambulatory Visit: Payer: Self-pay

## 2019-12-23 VITALS — BP 128/72 | HR 73 | Ht 64.0 in | Wt 203.0 lb

## 2019-12-23 DIAGNOSIS — I2510 Atherosclerotic heart disease of native coronary artery without angina pectoris: Secondary | ICD-10-CM | POA: Diagnosis not present

## 2019-12-23 DIAGNOSIS — C7951 Secondary malignant neoplasm of bone: Secondary | ICD-10-CM

## 2019-12-23 DIAGNOSIS — K089 Disorder of teeth and supporting structures, unspecified: Secondary | ICD-10-CM | POA: Diagnosis not present

## 2019-12-23 DIAGNOSIS — I10 Essential (primary) hypertension: Secondary | ICD-10-CM

## 2019-12-23 DIAGNOSIS — K2100 Gastro-esophageal reflux disease with esophagitis, without bleeding: Secondary | ICD-10-CM

## 2019-12-23 DIAGNOSIS — I700 Atherosclerosis of aorta: Secondary | ICD-10-CM

## 2019-12-23 MED ORDER — CLINPRO 5000 1.1 % DT PSTE: 1.0000 "application " | PASTE | Freq: Two times a day (BID) | DENTAL | 0 refills | Status: DC

## 2019-12-23 NOTE — Progress Notes (Signed)
Date:  12/23/2019   Name:  Kelli Williams   DOB:  05/01/1926   MRN:  [id number]   Chief Complaint: Hypertension (Follow up.) and Hyperlipidemia  Hypertension This is a chronic problem. The problem is controlled. Associated symptoms include palpitations. Pertinent negatives include no chest pain, headaches or shortness of breath. There are no associated agents to hypertension. There are no known risk factors for coronary artery disease. Past treatments include ACE inhibitors and diuretics. The current treatment provides significant improvement. There are no compliance problems.   Hyperlipidemia This is a chronic problem. The problem is controlled. Pertinent negatives include no chest pain or shortness of breath. Current antihyperlipidemic treatment includes ezetimibe. The current treatment provides moderate improvement of lipids.  Gastroesophageal Reflux She complains of heartburn. She reports no abdominal pain or no chest pain. This is a recurrent problem. The problem occurs occasionally. Pertinent negatives include no fatigue. She has tried a PPI for the symptoms. The treatment provided significant relief.  Metastatic breast cancer - she continues to follow up with Oncology and is under an active treatment plan.  She has some mild to moderate pain in her right lower back and hip for which she takes tramadol rarely. She finally had her bad tooth removed - it could not be saved.  She is now using a prescription toothpaste to maintain her other teeth.   Lab Results  Component Value Date   CREATININE 1.09 (H) 11/10/2019   BUN 30 (H) 11/10/2019   NA 137 11/10/2019   K 3.9 11/10/2019   CL 100 11/10/2019   CO2 28 11/10/2019   Lab Results  Component Value Date   CHOL 154 09/07/2019   HDL 57 09/07/2019   LDLCALC 82 09/07/2019   TRIG 78 09/07/2019   CHOLHDL 2.7 09/07/2019   Lab Results  Component Value Date   TSH 1.606 03/01/2019   No results found for: HGBA1C   Review of Systems    Constitutional: Positive for unexpected weight change. Negative for chills, fatigue and fever.  Respiratory: Negative for chest tightness and shortness of breath.   Cardiovascular: Positive for palpitations and leg swelling. Negative for chest pain.  Gastrointestinal: Positive for heartburn. Negative for abdominal pain, constipation and diarrhea.  Genitourinary: Negative for dysuria and hematuria.  Musculoskeletal: Positive for arthralgias, back pain and gait problem (uses a rolator).  Skin: Negative for color change and rash.  Allergic/Immunologic: Positive for food allergies (very sensitive to black pepper).  Neurological: Negative for dizziness, tremors, weakness, light-headedness and headaches.    Patient Active Problem List   Diagnosis Date Noted  . Dental disease 10/13/2019  . Lumbar pain 04/26/2019  . Dental cavity 08/10/2018  . PAD (peripheral artery disease) ([place]) 07/01/2018  . Aortic atherosclerosis ([place]) 07/01/2018  . Coronary artery calcification seen on CT scan 07/01/2018  . Cancer associated pain 02/05/2018  . Goals of care, counseling/discussion 11/24/2017  . Candida infection 11/24/2017  . Esophageal reflux 10/28/2017  . Postoperative seroma of subcutaneous tissue after non-dermatologic procedure 10/22/2017  . Bone metastases ([place]) 09/23/2017  . Osteopenia 09/08/2017  . Breast cancer of upper-outer quadrant of left female breast ([place]) 07/10/2017  . Irritable bowel syndrome without diarrhea 06/27/2017  . Oral lesion 06/27/2017  . BMI 33.0-33.9,adult 06/27/2017  . OA (osteoarthritis) of knee 10/16/2016  . Leg swelling 10/11/2015  . Neuropathy involving both lower extremities 06/19/2015  . Secondary localized osteoarthrosis, ankle and foot 06/01/2015  . Acne erythematosa 06/01/2015  . Avitaminosis D 06/01/2015  .  Atrial tachycardia ([place]) 08/24/2014  . Essential hypertension 08/17/2012  . History of breast cancer 08/17/2012    Allergies  Allergen Reactions  .  Amitriptyline Hives  . Amoxicillin     Diarrhea    . Black Pepper [[place]]   . Ivp Dye [Iodinated Diagnostic Agents]   . Tape Other (See Comments)    [place] - please use paper tape.  . Sulfa Antibiotics Rash  . Sulfa Antibiotics Rash    Past Surgical History:  Procedure Laterality Date  . ABDOMINAL HYSTERECTOMY    . AXILLARY LYMPH NODE BIOPSY Left 07/10/2017   INVASIVE MAMMARY CARCINOMA WITH FOCAL LOBULAR FEATURES.   Marland Kitchen BREAST BIOPSY Bilateral 1960   benign  . BREAST LUMPECTOMY Left 2000   breast ca with rad tx  . BREAST LUMPECTOMY Left 08/20/2017  . BREAST LUMPECTOMY Left 08/20/2017   Procedure: left breast wide excision;  Surgeon: Robert Bellow, MD;  Location: [facility] ORS;  Service: General;  Laterality: Left;  . CATARACT EXTRACTION     left eye  . CHOLECYSTECTOMY    . JOINT REPLACEMENT    . NASAL SEPTUM SURGERY    . PARTIAL KNEE ARTHROPLASTY     right  . TOTAL VAGINAL HYSTERECTOMY      Social History   Tobacco Use  . Smoking status: Never Smoker  . Smokeless tobacco: Never Used  . Tobacco comment: smoking cessation materials not required  Substance Use Topics  . Alcohol use: No    Alcohol/week: 0.0 standard drinks  . Drug use: No     Medication list has been reviewed and updated.  Current Meds  Medication Sig  . acetaminophen (TYLENOL) 325 MG tablet Take 650 mg by mouth every 6 (six) hours as needed.  Marland Kitchen aspirin 81 MG tablet Take 1 tablet by mouth daily.  . Calcium-Magnesium 500-250 MG TABS Take 1 tablet by mouth daily.   . Cholecalciferol (VITAMIN D3) 25 MCG (1000 UT) CAPS Take 1 capsule by mouth daily.   . clindamycin (CLEOCIN) 300 MG capsule Take 300 mg by mouth once. Prior to dental appointment  . Coenzyme Q10 (CO Q-10) 100 MG CAPS Take 1 tablet by mouth daily.   Marland Kitchen ezetimibe (ZETIA) 10 MG tablet TAKE 1 TABLET BY MOUTH  DAILY  . gabapentin (NEURONTIN) 300 MG capsule TAKE 3 CAPSULES BY MOUTH  TWICE DAILY  . lisinopril-hydrochlorothiazide (ZESTORETIC)  20-25 MG tablet TAKE 1 AND 1/2 TABLETS BY  MOUTH DAILY  . meloxicam (MOBIC) 15 MG tablet TAKE 1 TABLET BY MOUTH  DAILY  . Multiple Vitamins-Minerals (MULTIVITAMIN WITH MINERALS) tablet Take 1 tablet by mouth daily.  Marland Kitchen omeprazole (PRILOSEC) 20 MG capsule TAKE 1 CAPSULE BY MOUTH  DAILY  . Sodium Fluoride (CLINPRO 5000) 1.1 % PSTE Place onto teeth.  . traMADol (ULTRAM) 50 MG tablet TAKE 1/2 TO 1 TABLET BY  MOUTH EVERY 8 HOURS AS  NEEDED FOR PAIN    PHQ 2/9 Scores 12/23/2019 10/13/2019 06/21/2019 12/16/2018  PHQ - 2 Score 0 0 0 0  PHQ- 9 Score 2 - 0 -    BP Readings from Last 3 Encounters:  12/23/19 128/72  11/10/19 136/68  10/13/19 134/89    Physical Exam Constitutional:      Appearance: Normal appearance.  Neck:     Vascular: No carotid bruit.  Cardiovascular:     Rate and Rhythm: Normal rate and regular rhythm.     Pulses: Normal pulses.     Heart sounds: No murmur.  Pulmonary:  Effort: Pulmonary effort is normal.     Breath sounds: No wheezing or rhonchi.  Musculoskeletal:        General: Swelling present.     Cervical back: Normal range of motion.     Right lower leg: Edema present.     Left lower leg: Edema present.  Lymphadenopathy:     Cervical: No cervical adenopathy.  Skin:    General: Skin is warm.     Capillary Refill: Capillary refill takes less than 2 seconds.     Findings: No rash.  Neurological:     General: No focal deficit present.     Mental Status: She is alert and oriented to person, place, and time.     Gait: Gait abnormal.  Psychiatric:        Mood and Affect: Mood normal.     Wt Readings from Last 3 Encounters:  12/23/19 203 lb (92.1 kg)  11/10/19 200 lb 11.7 oz (91 kg)  10/13/19 196 lb (88.9 kg)    BP 128/72   Pulse 73   Ht 5\' 4"  (1.626 m)   Wt 203 lb (92.1 kg)   SpO2 94%   BMI 34.84 kg/m   Assessment and Plan: 1. Essential hypertension Clinically stable exam with well controlled BP on lisinopril hctz. Tolerating medications  without side effects at this time. Pt to continue current regimen and low sodium diet; benefits of regular exercise as able discussed.  2. 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  Dispense: 112 g; Refill: 0  3. Coronary artery calcification seen on CT scan She is now on Zetia 10 mg per day Her last lipid panel was excellent  4. Gastroesophageal reflux disease with esophagitis without hemorrhage Symptoms well controlled on daily PPI No red flag signs such as weight loss, n/v, melena Will continue omeprazole 20 mg.  5. Bone metastases ([place]) She has minimal pain most days which is relieved by Tylenol She is using a rolator for stability She will follow up regularly as scheduled with Oncology  6. Aortic atherosclerosis ([place]) On Zetia for lipids and ASA 81 mg  She had her first dose of Covid-19 vaccine through her assisted living. Partially dictated using Editor, commissioning. Any errors are unintentional.  Halina Maidens, MD [facility]  12/23/2019

## 2020-01-04 ENCOUNTER — Telehealth: Payer: Self-pay

## 2020-01-04 NOTE — Telephone Encounter (Signed)
Spoke with the patient to inform her, Per Dr Mike Gip noted the patient do not need a dental clerance, she will not need it due to not taking Xgeva at all. The patient was understanding and agreeable.

## 2020-01-05 ENCOUNTER — Other Ambulatory Visit: Payer: Self-pay

## 2020-01-05 ENCOUNTER — Inpatient Hospital Stay: Payer: Medicare Other | Attending: Hematology and Oncology

## 2020-01-05 VITALS — BP 186/73 | HR 62

## 2020-01-05 DIAGNOSIS — C7951 Secondary malignant neoplasm of bone: Secondary | ICD-10-CM | POA: Diagnosis not present

## 2020-01-05 DIAGNOSIS — M85851 Other specified disorders of bone density and structure, right thigh: Secondary | ICD-10-CM

## 2020-01-05 DIAGNOSIS — C50412 Malignant neoplasm of upper-outer quadrant of left female breast: Secondary | ICD-10-CM | POA: Diagnosis not present

## 2020-01-05 DIAGNOSIS — Z5111 Encounter for antineoplastic chemotherapy: Secondary | ICD-10-CM | POA: Insufficient documentation

## 2020-01-05 MED ORDER — FULVESTRANT 250 MG/5ML IM SOLN
500.0000 mg | Freq: Once | INTRAMUSCULAR | Status: AC
  Administered 2020-01-05: 14:00:00 500 mg via INTRAMUSCULAR
  Filled 2020-01-05: qty 10

## 2020-01-10 DIAGNOSIS — H2511 Age-related nuclear cataract, right eye: Secondary | ICD-10-CM | POA: Diagnosis not present

## 2020-01-31 NOTE — Progress Notes (Signed)
[facility]  [address] [place], [place] [address] Phone: [phone]  Fax: [phone]   Clinic Day:  02/02/2020  Referring physician: Glean Hess, MD  Chief Complaint: Kelli Williams is a 86 y.o. female with metastatic breast cancer who is seen for a 2 month assessment , review of interval bone scan, and continuation of monthly Faslodex.   HPI: The patient was last seen in the medical oncology clinic on 11/10/2019. At that time, she noted chronic lower back pain. She had a dental extraction. Exam was stable. CBC was normal. Creatinine was 1.09.  CA 27.29 was 18.5. She received Falsodex 500 mg. She continued calcium and vitamin D. We discussed a bone scan for new baseline as PET scan was denied.  Delton See was held secondary to dental extraction.   Bone scan on 11/19/2019 showed no signs of uptake to suggest metastatic disease. Some areas of increased uptake in the cervical and lumbar spine were more compatible with degenerative changes based on comparison with previous CT evaluation.  She received Falsodex on 12/08/2019 and 01/05/2020.   During the interim, she has felt "ok". She notes feeling a lump s/p injections. Back and joint pain are unchanged. She notes the orthopaedic surgeon Dr. Rudene Christians would not perform surgery due to her age. She continues to have balance issues. She does not perform monthly breast exams. She notes left breast pain.    Past Medical History:  Diagnosis Date  . Anxiety   . Arthritis    BOTH FEET AND KNEES  . Breast cancer ([place]) 2000   left breast ca with lumpectomy and rad tx 5 yr tamoxifen/Dr Jeannine Kitten at [facility]  . Complication of anesthesia    WOKE UP DURING SURGERY FOR DEVIATED SEPTUM, KNEE REPLACEMENT AND BREAST LUMPECTOMY  . Dysrhythmia   . GERD (gastroesophageal reflux disease)   . Hypertension   . Neuropathic pain of both legs   . Neuropathy   . Personal history of chemotherapy   . Personal history of radiation  therapy   . Pneumonia 2016  . Skin cancer of forehead   . Skin cancer of nose     Past Surgical History:  Procedure Laterality Date  . ABDOMINAL HYSTERECTOMY    . AXILLARY LYMPH NODE BIOPSY Left 07/10/2017   INVASIVE MAMMARY CARCINOMA WITH FOCAL LOBULAR FEATURES.   Marland Kitchen BREAST BIOPSY Bilateral 1960   benign  . BREAST LUMPECTOMY Left 2000   breast ca with rad tx  . BREAST LUMPECTOMY Left 08/20/2017  . BREAST LUMPECTOMY Left 08/20/2017   Procedure: left breast wide excision;  Surgeon: Robert Bellow, MD;  Location: [facility] ORS;  Service: General;  Laterality: Left;  . CATARACT EXTRACTION     left eye  . CHOLECYSTECTOMY    . JOINT REPLACEMENT    . NASAL SEPTUM SURGERY    . PARTIAL KNEE ARTHROPLASTY     right  . TOTAL VAGINAL HYSTERECTOMY      Family History  Problem Relation Age of Onset  . Heart disease Mother   . Heart attack Paternal Uncle   . Breast cancer Daughter 52       genetic negative  . Cancer Paternal Grandmother     Social History:  reports that she has never smoked. She has never used smokeless tobacco. She reports that she does not drink alcohol or use drugs. The patient's husband died 2 years ago.She was born outside of [place].She previously worked as an Web designer. She lives at the  [facility] [place]. She is independent except for the use of her walker. Patient has a daughter, Cortina Vultaggio. The patient is alone today.  Allergies:  Allergies  Allergen Reactions  . Amitriptyline Hives  . Amoxicillin     Diarrhea    . Black Pepper [[place]]   . Ivp Dye [Iodinated Diagnostic Agents]   . Tape Other (See Comments)    [place] - please use paper tape.  . Sulfa Antibiotics Rash  . Sulfa Antibiotics Rash    Current Medications: Current Outpatient Medications  Medication Sig Dispense Refill  . acetaminophen (TYLENOL) 325 MG tablet Take 650 mg by mouth every 6 (six) hours as needed.    Marland Kitchen aspirin 81 MG tablet Take 1 tablet by mouth daily.      . Calcium-Magnesium 500-250 MG TABS Take 1 tablet by mouth daily.     . Cholecalciferol (VITAMIN D3) 25 MCG (1000 UT) CAPS Take 1 capsule by mouth daily.     . Coenzyme Q10 (CO Q-10) 100 MG CAPS Take 1 tablet by mouth daily.     Marland Kitchen ezetimibe (ZETIA) 10 MG tablet TAKE 1 TABLET BY MOUTH  DAILY 90 tablet 3  . gabapentin (NEURONTIN) 300 MG capsule TAKE 3 CAPSULES BY MOUTH  TWICE DAILY 540 capsule 3  . lisinopril-hydrochlorothiazide (ZESTORETIC) 20-25 MG tablet TAKE 1 AND 1/2 TABLETS BY  MOUTH DAILY 135 tablet 3  . meloxicam (MOBIC) 15 MG tablet TAKE 1 TABLET BY MOUTH  DAILY 90 tablet 3  . Multiple Vitamins-Minerals (MULTIVITAMIN WITH MINERALS) tablet Take 1 tablet by mouth daily.    Marland Kitchen omeprazole (PRILOSEC) 20 MG capsule TAKE 1 CAPSULE BY MOUTH  DAILY 90 capsule 3  . Sodium Fluoride (CLINPRO 5000) 1.1 % PSTE Place 1 application onto teeth 2 (two) times daily. 112 g 0  . traMADol (ULTRAM) 50 MG tablet TAKE 1/2 TO 1 TABLET BY  MOUTH EVERY 8 HOURS AS  NEEDED FOR PAIN 90 tablet 0  . clindamycin (CLEOCIN) 300 MG capsule Take 300 mg by mouth once. Prior to dental appointment     No current facility-administered medications for this visit.    Review of Systems  Constitutional: Negative for chills, diaphoresis, fever, malaise/fatigue and weight loss (stable).       Feels "ok".  HENT: Negative.  Negative for congestion, ear pain, nosebleeds, sinus pain and sore throat.   Eyes: Negative.  Negative for blurred vision, double vision, photophobia and pain.  Respiratory: Negative.  Negative for cough, hemoptysis, sputum production and shortness of breath.   Cardiovascular: Positive for chest pain (left breast). Negative for palpitations, leg swelling (chronic) and PND.  Gastrointestinal: Negative.  Negative for abdominal pain, blood in stool, constipation, diarrhea, melena, nausea and vomiting.  Genitourinary: Negative.  Negative for dysuria, frequency, hematuria and urgency.  Musculoskeletal: Positive for  back pain (chronic) and joint pain (chronic LEFT knee "bone on bone" and left hip pain). Negative for falls, myalgias and neck pain.  Skin: Negative.  Negative for itching and rash.       Lumps under skin s/p injection.  Neurological: Negative for dizziness, tremors, sensory change (stable neuropathy on gabapentin), speech change, focal weakness, weakness and headaches.       Chronic balance issues.  Endo/Heme/Allergies: Does not bruise/bleed easily.       Allergy to pepper.  Psychiatric/Behavioral: Negative.  Negative for depression and memory loss. The patient is not nervous/anxious and does not have insomnia.   All other systems reviewed and are negative.  Performance  status (ECOG):  2  Vitals Blood pressure 140/70, pulse (!) 53, resp. rate 18, weight 200 lb (90.7 kg), SpO2 97 %.   Physical Exam  Constitutional: She is oriented to person, place, and time. Vital signs are normal. She appears well-developed and well-nourished. No distress.  She has a rolling walker at her side. Needs some assistance on and off exam room table.  HENT:  Head: Normocephalic and atraumatic.  Mouth/Throat: Oropharynx is clear and moist. No oropharyngeal exudate.  Long gray hair.  Mask.  Eyes: Pupils are equal, round, and reactive to light. Conjunctivae and EOM are normal. No scleral icterus.  Glasses.  Neck: No JVD present.  Cardiovascular: Normal rate, regular rhythm and normal heart sounds. Exam reveals no gallop.  No murmur heard. Pulmonary/Chest: Effort normal and breath sounds normal. No respiratory distress. She has no wheezes. She has no rales. She exhibits no tenderness. Right breast exhibits tenderness. Right breast exhibits no skin change (mild fibrocystic changes). Left breast exhibits tenderness (scar tissue in upper outer quadrant). Left breast exhibits no skin change.  Abdominal: Soft. Normal appearance and bowel sounds are normal. She exhibits no distension and no mass. There is no abdominal  tenderness. There is no rebound and no guarding.  Musculoskeletal:        General: Tenderness (lower extremities) and edema (chronic; mild) present. Normal range of motion.     Cervical back: Normal range of motion and neck supple.     Left hip: Tenderness present.  Lymphadenopathy:       Head (right side): No preauricular, no posterior auricular and no occipital adenopathy present.       Head (left side): No preauricular, no posterior auricular and no occipital adenopathy present.    She has no cervical adenopathy.    She has no axillary adenopathy.       Right: No supraclavicular adenopathy present.       Left: No supraclavicular adenopathy present.  Neurological: She is alert and oriented to person, place, and time.  Skin: Skin is warm and dry. No rash noted. She is not diaphoretic. No erythema. No pallor.  Psychiatric: She has a normal mood and affect. Her behavior is normal. Judgment and thought content normal.  Nursing note and vitals reviewed.   Imaging studies: 09/16/2017: PET scanrevealed widespread scattered hypermetabolic lytic osseous metastasesthroughout the axial and proximal appendicular skeleton. Bilateral proximal femoral skeletal metastases placed the patient at risk for pathologic hip fractures. There was mild hypermetabolic right hilar lymphadenopathy.  09/23/2017: Plain films of the bilateral femursrevealed no pathologic fracture. The LEFT view showed lytic subtrochanteric metastasis that was previously seen on PET scan.  06/01/2018: PET scandemonstrated marked improvementin the osseous metastatic disease. Previously demonstrated areas of abnormally high metabolic activity have resolved, with the exception of a very faint area of residual activity (SUV 2.6; previously 11.5) in the LEFT subtrochanteric region. Previous LEFT axillary fluid collection now shows a 1.8 x 1.2 cm scarlike density with low-grade activity (SUV 2.7). Incidental findings include a small  hiatal hernia and diverticulosis of the sigmoid colon.  02/11/2018: Lumbar spine MRI revealed multifocal lumbosacral vertebral body signal abnormalities consistent with osseous metastatic disease. The largest lesion was at L1. There was no pathologic compression fracture. There was multi-level moderate to severe neural foraminal stenosis, which had progressed since the prior study. 09/26/2018: Sacral MRI revealed scattered small marrow lesionsc/wmetastatic disease.The0.6 cm lesion in the S1 vertebral bodywasnew since the comparison lumbar spine MRI. Small lesion in the left ilium adjacent  to the SI joint was present on the prior MRI. The remaining lesions werenot included on the prior MRI. There was nofracture or other acute abnormality.There was lower lumbar spondylosisand diverticulosis. 10/15/2018: PET scanrevealed no evidence of active breast cancer metastasis on FDG PET scan. There were stable lytic lesions in the spine without evidence of metabolic activity consistent treated metastasis. 05/20/2019:PET scanrevealed no evidence of breast cancer recurrence or progression.There were stable lytic lesions in the spine without associated metabolic activity.CT datawasdegraded by patient motion. 09/16/2019:  Bone density revealed osteopenia with a T-score of -1.9 at the forearm radius.  11/19/2019:  Bone scan revealed no signs of uptake to suggest metastatic disease. Some areas of increased uptake in the cervical and lumbar spine were more compatible with degenerative changes based on comparison with previous CT evaluation.   Appointment on 02/02/2020  Component Date Value Ref Range Status  . Sodium 02/02/2020 135  135 - 145 mmol/L Final  . Potassium 02/02/2020 3.5  3.5 - 5.1 mmol/L Final  . Chloride 02/02/2020 99  98 - 111 mmol/L Final  . CO2 02/02/2020 24  22 - 32 mmol/L Final  . Glucose, Bld 02/02/2020 101* 70 - 99 mg/dL Final   Glucose reference range applies only to  samples taken after fasting for at least 8 hours.  . BUN 02/02/2020 28* 8 - 23 mg/dL Final  . Creatinine, Ser 02/02/2020 1.05* 0.44 - 1.00 mg/dL Final  . Calcium 02/02/2020 8.9  8.9 - 10.3 mg/dL Final  . Total Protein 02/02/2020 7.5  6.5 - 8.1 g/dL Final  . Albumin 02/02/2020 3.9  3.5 - 5.0 g/dL Final  . AST 02/02/2020 21  15 - 41 U/L Final  . ALT 02/02/2020 13  0 - 44 U/L Final  . Alkaline Phosphatase 02/02/2020 78  38 - 126 U/L Final  . Total Bilirubin 02/02/2020 0.4  0.3 - 1.2 mg/dL Final  . GFR calc non Af Amer 02/02/2020 46* >60 mL/min Final  . GFR calc Af Amer 02/02/2020 53* >60 mL/min Final  . Anion gap 02/02/2020 12  5 - 15 Final   Performed at [facility], [address], [place], [place] [address]  . WBC 02/02/2020 7.5  4.0 - 10.5 K/uL Final  . RBC 02/02/2020 4.40  3.87 - 5.11 MIL/uL Final  . Hemoglobin 02/02/2020 12.8  12.0 - 15.0 g/dL Final  . HCT 02/02/2020 38.5  36.0 - 46.0 % Final  . MCV 02/02/2020 87.5  80.0 - 100.0 fL Final  . MCH 02/02/2020 29.1  26.0 - 34.0 pg Final  . MCHC 02/02/2020 33.2  30.0 - 36.0 g/dL Final  . RDW 02/02/2020 14.5  11.5 - 15.5 % Final  . Platelets 02/02/2020 227  150 - 400 K/uL Final  . nRBC 02/02/2020 0.0  0.0 - 0.2 % Final  . Neutrophils Relative % 02/02/2020 70  % Final  . Neutro Abs 02/02/2020 5.4  1.7 - 7.7 K/uL Final  . Lymphocytes Relative 02/02/2020 14  % Final  . Lymphs Abs 02/02/2020 1.0  0.7 - 4.0 K/uL Final  . Monocytes Relative 02/02/2020 8  % Final  . Monocytes Absolute 02/02/2020 0.6  0.1 - 1.0 K/uL Final  . Eosinophils Relative 02/02/2020 6  % Final  . Eosinophils Absolute 02/02/2020 0.5  0.0 - 0.5 K/uL Final  . Basophils Relative 02/02/2020 1  % Final  . Basophils Absolute 02/02/2020 0.1  0.0 - 0.1 K/uL Final  . Immature Granulocytes 02/02/2020 1  % Final  .  Abs Immature Granulocytes 02/02/2020 0.04  0.00 - 0.07 K/uL Final   Performed at [facility], [address], [place], [place] [address]     Assessment:  MILLENIA WALDVOGEL is a 86 y.o. female with metastatic breast cancer s/p left axillary biopsy on 07/10/2017. Pathology revealed a 1.1 cm grade II invasive mammary carcinoma with focal lobular features. Wide excisionon 08/20/2017 revealed metastatic mammary carcinoma involving 1 of 4 axillary lymph nodes with extracapsular extension. The superior medial margin was involved. Tumor was ER + (>90%), PR + (>90%), and Her2/neu 2+ (FISH -).   [facility] genetic testingon 09/26/2017 revealed no pathogenic sequence variants or deletions/duplications. BRCA1/2 testing on 09/26/2017 was negative.  She has a history of left breast cancers/p wide excisionand sentinel lymph node biopsy on 07/18/1999 [facility]. Pathology revealed a 0.8 x 0.6 x 0.4 cm grade II invasive ductal adenocarcinoma. There was no lymphovascular invasion. 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 Margins were negative. Tumor was ER + (50%) and PR + (90%). Pathologic stagewas T1bN0. She received 6200 cGyfrom 08/21/1999 - 10/03/1999. She completed 5 years of tamoxifenin 06/2004.   PET scanon 09/16/2017 revealed widespread scattered hypermetabolic lytic osseous metastases throughout the axial and proximal appendicular skeleton. Bilateral proximal femoral skeletal metastases placed the patient at risk for pathologic hip fractures. There was mild hypermetabolic right hilar lymphadenopathy. Plain films of the bilateral femurson 09/23/2017 revealed no pathologic fracture. The LEFT view showed lytic subtrochanteric metastasis that was previously seen on PET scan.   PET scan on 05/20/2019 revealedno evidence of breast cancer recurrence or progression.There were stable lytic lesions in the spine without associated metabolic activity.CT datawasdegraded by patient motion.  Bone scan on 11/19/2019 showed no signs of uptake to suggest metastatic disease. Some areas of increased uptake in the cervical  and lumbar spine were more compatible with degenerative changes based on comparison with previous CT evaluation.  CA27.29has been followed: 14.7 on 02/05/2017, 19.1 on 02/06/2012, 172.2 on 09/04/2017, 144.9 on 11/06/2017, 73.4 on 02/05/2018, 63.3 on 03/06/2018, 47.2 on 04/06/2018, 42.4 on 05/07/2018, 36.6 on 06/08/2018, 34.6 on 07/06/2018, 27.1 on 08/06/2018, 28.2 on 09/10/2018, 26.0 on 10/09/2018, 30.9 on 11/09/2018, 27.7 on 12/07/2018, 35.8 on 01/04/2019, 24.8 on 02/01/2019, 24.2 on 04/26/2019, 21.6 on 05/24/2019, 24.5 on 07/19/2019, 20.6 on 10/12/2019, 18.5 on 11/10/2019, 20.0 on 02/02/2020.  She received a palliative course of radiation(3000 cGy in 10 fractions) to the left hip from 10/14/2017 - 10/27/2017. She received a course of palliative radiationthe the lumbar spine and SI joints from 03/04/2018 - 03/27/2018.  She began Faslodexon 09/26/2017 (last 01/05/2020). She began monthly Xgevaon 09/26/2017 (last 05/07/2018). She has dental issues.  Bone densityon 09/01/2017 revealed osteopeniawith a T-score of -1.9 in the right femoral neck and left forearm. Bone density on 09/16/2019 revealed osteopenia with a T-score of -1.9 at the forearm radius.  She is on calcium and vitamin D.  Symptomatically, she has chronic left hip and knee pain unrelated to breast cancer.  Exam is stable.  Plan: 1.   Labs today: CBC with diff, CMP, CA 27.29. 2.Metastatic breast cancer Clinically, dhe is doing well.  Exam remains stable.  CA 27.29 is 20.0 (normal). PET scan on06/10/2019 revealed no evidence of progression. Bone scan on 11/19/2019 was personally reviewed.  Agree with radiology interpretation.  No evidence of active metastatic disease. Continue Faslodex today and monthly. 3.Bone metastasis Patient had no evidence of metastatic disease on PET scan from 05/20/2019.  No evidence of active metastatic disease on recent  bone scan. Continue to hold Xgeva cleared by dentistry or  active bone metastasis. 4.Osteopenia Continue calcium and vitamin D. RN to obtain clearance for future Prolia. 5.Faslodex today 6.   RTC monthly x2 for Faslodex 7.   RTC in 3 months for MD assessment, labs (CBC with diff CMP, CA 27.29) and Faslodex.  I discussed the assessment and treatment plan with the patient.  The patient was provided an opportunity to ask questions and all were answered.  The patient agreed with the plan and demonstrated an understanding of the instructions.  The patient was advised to call back if the symptoms worsen or if the condition fails to improve as anticipated.  I provided 22 minutes of face-to-face time during this this encounter and > 50% was spent counseling as documented under my assessment and plan.    Lequita Asal, MD, PhD    02/02/2020, 11:13 AM  I, Selena Batten, am acting as scribe for [employer]. Mike Gip, MD, PhD.  I, Penina Reisner C. Mike Gip, MD, have reviewed the above documentation for accuracy and completeness, and I agree with the above.

## 2020-02-02 ENCOUNTER — Inpatient Hospital Stay: Payer: Medicare Other

## 2020-02-02 ENCOUNTER — Inpatient Hospital Stay: Payer: Medicare Other | Attending: Hematology and Oncology | Admitting: Hematology and Oncology

## 2020-02-02 ENCOUNTER — Other Ambulatory Visit: Payer: Self-pay

## 2020-02-02 ENCOUNTER — Encounter: Payer: Self-pay | Admitting: Hematology and Oncology

## 2020-02-02 VITALS — BP 140/70 | HR 53 | Resp 18 | Wt 200.0 lb

## 2020-02-02 DIAGNOSIS — M85851 Other specified disorders of bone density and structure, right thigh: Secondary | ICD-10-CM

## 2020-02-02 DIAGNOSIS — C50912 Malignant neoplasm of unspecified site of left female breast: Secondary | ICD-10-CM | POA: Diagnosis not present

## 2020-02-02 DIAGNOSIS — Z5111 Encounter for antineoplastic chemotherapy: Secondary | ICD-10-CM | POA: Diagnosis not present

## 2020-02-02 DIAGNOSIS — C7951 Secondary malignant neoplasm of bone: Secondary | ICD-10-CM | POA: Diagnosis not present

## 2020-02-02 DIAGNOSIS — Z170 Estrogen receptor positive status [ER+]: Secondary | ICD-10-CM | POA: Diagnosis not present

## 2020-02-02 DIAGNOSIS — C50412 Malignant neoplasm of upper-outer quadrant of left female breast: Secondary | ICD-10-CM

## 2020-02-02 LAB — COMPREHENSIVE METABOLIC PANEL
ALT: 13 U/L (ref 0–44)
AST: 21 U/L (ref 15–41)
Albumin: 3.9 g/dL (ref 3.5–5.0)
Alkaline Phosphatase: 78 U/L (ref 38–126)
Anion gap: 12 (ref 5–15)
BUN: 28 mg/dL — ABNORMAL HIGH (ref 8–23)
CO2: 24 mmol/L (ref 22–32)
Calcium: 8.9 mg/dL (ref 8.9–10.3)
Chloride: 99 mmol/L (ref 98–111)
Creatinine, Ser: 1.05 mg/dL — ABNORMAL HIGH (ref 0.44–1.00)
GFR calc Af Amer: 53 mL/min — ABNORMAL LOW (ref >60)
GFR calc non Af Amer: 46 mL/min — ABNORMAL LOW (ref >60)
Glucose, Bld: 101 mg/dL — ABNORMAL HIGH (ref 70–99)
Potassium: 3.5 mmol/L (ref 3.5–5.1)
Sodium: 135 mmol/L (ref 135–145)
Total Bilirubin: 0.4 mg/dL (ref 0.3–1.2)
Total Protein: 7.5 g/dL (ref 6.5–8.1)

## 2020-02-02 LAB — CBC WITH DIFFERENTIAL/PLATELET
Abs Immature Granulocytes: 0.04 10*3/uL (ref 0.00–0.07)
Basophils Absolute: 0.1 10*3/uL (ref 0.0–0.1)
Basophils Relative: 1 %
Eosinophils Absolute: 0.5 10*3/uL (ref 0.0–0.5)
Eosinophils Relative: 6 %
HCT: 38.5 % (ref 36.0–46.0)
Hemoglobin: 12.8 g/dL (ref 12.0–15.0)
Immature Granulocytes: 1 %
Lymphocytes Relative: 14 %
Lymphs Abs: 1 10*3/uL (ref 0.7–4.0)
MCH: 29.1 pg (ref 26.0–34.0)
MCHC: 33.2 g/dL (ref 30.0–36.0)
MCV: 87.5 fL (ref 80.0–100.0)
Monocytes Absolute: 0.6 10*3/uL (ref 0.1–1.0)
Monocytes Relative: 8 %
Neutro Abs: 5.4 10*3/uL (ref 1.7–7.7)
Neutrophils Relative %: 70 %
Platelets: 227 10*3/uL (ref 150–400)
RBC: 4.4 MIL/uL (ref 3.87–5.11)
RDW: 14.5 % (ref 11.5–15.5)
WBC: 7.5 10*3/uL (ref 4.0–10.5)
nRBC: 0 % (ref 0.0–0.2)

## 2020-02-02 MED ORDER — FULVESTRANT 250 MG/5ML IM SOLN
500.0000 mg | Freq: Once | INTRAMUSCULAR | Status: AC
  Administered 2020-02-02: 12:00:00 500 mg via INTRAMUSCULAR

## 2020-02-02 NOTE — Patient Instructions (Signed)
Fulvestrant injection What is this medicine? FULVESTRANT (ful VES trant) blocks the effects of estrogen. It is used to treat breast cancer. This medicine may be used for other purposes; ask your health care provider or pharmacist if you have questions. COMMON BRAND NAME(S): FASLODEX What should I tell my health care provider before I take this medicine? They need to know if you have any of these conditions:  bleeding disorders  liver disease  low blood counts, like low white cell, platelet, or red cell counts  an unusual or allergic reaction to fulvestrant, other medicines, foods, dyes, or preservatives  pregnant or trying to get pregnant  breast-feeding How should I use this medicine? This medicine is for injection into a muscle. It is usually given by a health care professional in a hospital or clinic setting. Talk to your pediatrician regarding the use of this medicine in children. Special care may be needed. Overdosage: If you think you have taken too much of this medicine contact a poison control center or emergency room at once. NOTE: This medicine is only for you. Do not share this medicine with others. What if I miss a dose? It is important not to miss your dose. Call your doctor or health care professional if you are unable to keep an appointment. What may interact with this medicine?  medicines that treat or prevent blood clots like warfarin, enoxaparin, dalteparin, apixaban, dabigatran, and rivaroxaban This list may not describe all possible interactions. Give your health care provider a list of all the medicines, herbs, non-prescription drugs, or dietary supplements you use. Also tell them if you smoke, drink alcohol, or use illegal drugs. Some items may interact with your medicine. What should I watch for while using this medicine? Your condition will be monitored carefully while you are receiving this medicine. You will need important blood work done while you are taking  this medicine. Do not become pregnant while taking this medicine or for at least 1 year after stopping it. Women of child-bearing potential will need to have a negative pregnancy test before starting this medicine. Women should inform their doctor if they wish to become pregnant or think they might be pregnant. There is a potential for serious side effects to an unborn child. Men should inform their doctors if they wish to father a child. This medicine may lower sperm counts. Talk to your health care professional or pharmacist for more information. Do not breast-feed an infant while taking this medicine or for 1 year after the last dose. What side effects may I notice from receiving this medicine? Side effects that you should report to your doctor or health care professional as soon as possible:  allergic reactions like skin rash, itching or hives, swelling of the face, lips, or tongue  feeling faint or lightheaded, falls  pain, tingling, numbness, or weakness in the legs  signs and symptoms of infection like fever or chills; cough; flu-like symptoms; sore throat  vaginal bleeding Side effects that usually do not require medical attention (report to your doctor or health care professional if they continue or are bothersome):  aches, pains  constipation  diarrhea  headache  hot flashes  nausea, vomiting  pain at site where injected  stomach pain This list may not describe all possible side effects. Call your doctor for medical advice about side effects. You may report side effects to FDA at 1-800-FDA-1088. Where should I keep my medicine? This drug is given in a hospital or clinic and will   not be stored at home. NOTE: This sheet is a summary. It may not cover all possible information. If you have questions about this medicine, talk to your doctor, pharmacist, or health care provider.  2020 Elsevier/Gold Standard (2018-03-05 11:34:41)  

## 2020-02-03 LAB — CANCER ANTIGEN 27.29: CA 27.29: 20 U/mL (ref 0.0–38.6)

## 2020-02-10 ENCOUNTER — Telehealth: Payer: Self-pay

## 2020-02-10 NOTE — Telephone Encounter (Signed)
I have reached out again to [facility] and still have not recived a clearance for this patinet. I have reached out again today. I am waiting on a responds form Curlene Dolphin from [facility]. I will continue to reach out to the facility for the patient dental clearance.

## 2020-03-01 ENCOUNTER — Inpatient Hospital Stay: Payer: Medicare Other

## 2020-03-01 ENCOUNTER — Inpatient Hospital Stay: Payer: Medicare Other | Attending: Hematology and Oncology

## 2020-03-01 DIAGNOSIS — Z5111 Encounter for antineoplastic chemotherapy: Secondary | ICD-10-CM | POA: Insufficient documentation

## 2020-03-01 DIAGNOSIS — M85851 Other specified disorders of bone density and structure, right thigh: Secondary | ICD-10-CM

## 2020-03-01 DIAGNOSIS — C50911 Malignant neoplasm of unspecified site of right female breast: Secondary | ICD-10-CM | POA: Insufficient documentation

## 2020-03-01 MED ORDER — FULVESTRANT 250 MG/5ML IM SOLN
500.0000 mg | Freq: Once | INTRAMUSCULAR | Status: AC
  Administered 2020-03-01: 500 mg via INTRAMUSCULAR
  Filled 2020-03-01: qty 10

## 2020-03-29 ENCOUNTER — Inpatient Hospital Stay: Payer: Medicare Other | Admitting: *Deleted

## 2020-03-29 ENCOUNTER — Inpatient Hospital Stay: Payer: Medicare Other | Attending: Hematology and Oncology

## 2020-03-29 ENCOUNTER — Inpatient Hospital Stay: Payer: Medicare Other

## 2020-03-29 ENCOUNTER — Other Ambulatory Visit: Payer: Self-pay

## 2020-03-29 DIAGNOSIS — Z5111 Encounter for antineoplastic chemotherapy: Secondary | ICD-10-CM | POA: Insufficient documentation

## 2020-03-29 DIAGNOSIS — C50412 Malignant neoplasm of upper-outer quadrant of left female breast: Secondary | ICD-10-CM | POA: Diagnosis not present

## 2020-03-29 DIAGNOSIS — M85851 Other specified disorders of bone density and structure, right thigh: Secondary | ICD-10-CM

## 2020-03-29 MED ORDER — FULVESTRANT 250 MG/5ML IM SOLN
500.0000 mg | Freq: Once | INTRAMUSCULAR | Status: AC
  Administered 2020-03-29: 500 mg via INTRAMUSCULAR
  Filled 2020-03-29: qty 10

## 2020-03-30 ENCOUNTER — Ambulatory Visit: Payer: Medicare Other

## 2020-04-21 NOTE — Progress Notes (Signed)
[facility]  [address], [address] [place], [place] [address] Phone: [phone]  Fax: [phone]   Clinic Day:  04/26/2020  Referring physician: Glean Hess, MD  Chief Complaint: Kelli Williams is a 86 y.o. female with metastatic breast cancer who is seen for a 3 month assessment and continuation of monthly Faslodex.   HPI: The patient was last seen in the medical oncology clinic on 02/02/2020. At that time, she had chronic left hip and knee pain unrelated to breast cancer. Exam was stable. Hematocrit was 38.5, hemoglobin 12.8, MCV 87.5, platelets 227,000, WBC 7500, ANC 5400. CA 27.29 was 20.0.   She received Faslodex monthly (02/02/2020 - 03/29/2020).    During the interim, she is 'okay'. She hasn't been doing very well lately. She has been having bowel and stomach issues. She had diarrhea yesterday. She has a decreased appetite. She was only able to eat breakfast yesterday; she has sausage, eggs, and toast. She loves coffee but has cut down on it. She has pain in her lower back, it sometimes travels across to her upper back. Her left breast has been sore. She no longer has chest pain. Her neuropathy is the same. Sometimes her legs feel numb but sometimes it is painful. Her balance is 'bad'. She denies any falls.   She received both doses of the [place] COVID-19 vaccine in 03/2020.    Past Medical History:  Diagnosis Date  . Anxiety   . Arthritis    BOTH FEET AND KNEES  . Breast cancer ([place]) 2000   left breast ca with lumpectomy and rad tx 5 yr tamoxifen/Dr Jeannine Kitten at [place]  . Complication of anesthesia    WOKE UP DURING SURGERY FOR DEVIATED SEPTUM, KNEE REPLACEMENT AND BREAST LUMPECTOMY  . Dysrhythmia   . GERD (gastroesophageal reflux disease)   . Hypertension   . Neuropathic pain of both legs   . Neuropathy   . Personal history of chemotherapy   . Personal history of radiation therapy   . Pneumonia 2016  . Skin cancer of forehead   . Skin  cancer of nose     Past Surgical History:  Procedure Laterality Date  . ABDOMINAL HYSTERECTOMY    . AXILLARY LYMPH NODE BIOPSY Left 07/10/2017   INVASIVE MAMMARY CARCINOMA WITH FOCAL LOBULAR FEATURES.   Marland Kitchen BREAST BIOPSY Bilateral 1960   benign  . BREAST LUMPECTOMY Left 2000   breast ca with rad tx  . BREAST LUMPECTOMY Left 08/20/2017  . BREAST LUMPECTOMY Left 08/20/2017   Procedure: left breast wide excision;  Surgeon: Robert Bellow, MD;  Location: [facility] ORS;  Service: General;  Laterality: Left;  . CATARACT EXTRACTION     left eye  . CHOLECYSTECTOMY    . JOINT REPLACEMENT    . NASAL SEPTUM SURGERY    . PARTIAL KNEE ARTHROPLASTY     right  . TOTAL VAGINAL HYSTERECTOMY      Family History  Problem Relation Age of Onset  . Heart disease Mother   . Heart attack Paternal Uncle   . Breast cancer Daughter 71       genetic negative  . Cancer Paternal Grandmother     Social History:  reports that she has never smoked. She has never used smokeless tobacco. She reports that she does not drink alcohol or use drugs. The patient's husband died 2 years ago.She was born outside of [place].She previously worked as an Web designer. She lives at the [facility] [place]. She is  independent except for the use of her walker. Patient has a daughter, Jaselle Pryer. The patient is alone  today.   Allergies:  Allergies  Allergen Reactions  . Amitriptyline Hives  . Amoxicillin     Diarrhea    . Black Pepper [[place]]   . Ivp Dye [Iodinated Diagnostic Agents]   . Tape Other (See Comments)    [place] - please use paper tape.  . Sulfa Antibiotics Rash  . Sulfa Antibiotics Rash    Current Medications: Current Outpatient Medications  Medication Sig Dispense Refill  . acetaminophen (TYLENOL) 325 MG tablet Take 650 mg by mouth every 6 (six) hours as needed.    Marland Kitchen aspirin 81 MG tablet Take 1 tablet by mouth daily.    . Calcium-Magnesium 500-250 MG TABS Take 1 tablet by mouth  daily.     . Cholecalciferol (VITAMIN D3) 25 MCG (1000 UT) CAPS Take 1 capsule by mouth daily.     . clindamycin (CLEOCIN) 300 MG capsule Take 300 mg by mouth once. Prior to dental appointment    . Coenzyme Q10 (CO Q-10) 100 MG CAPS Take 1 tablet by mouth daily.     Marland Kitchen ezetimibe (ZETIA) 10 MG tablet TAKE 1 TABLET BY MOUTH  DAILY 90 tablet 3  . gabapentin (NEURONTIN) 300 MG capsule TAKE 3 CAPSULES BY MOUTH  TWICE DAILY 540 capsule 3  . lisinopril-hydrochlorothiazide (ZESTORETIC) 20-25 MG tablet TAKE 1 AND 1/2 TABLETS BY  MOUTH DAILY 135 tablet 3  . meloxicam (MOBIC) 15 MG tablet TAKE 1 TABLET BY MOUTH  DAILY 90 tablet 3  . Multiple Vitamins-Minerals (MULTIVITAMIN WITH MINERALS) tablet Take 1 tablet by mouth daily.    Marland Kitchen omeprazole (PRILOSEC) 20 MG capsule TAKE 1 CAPSULE BY MOUTH  DAILY 90 capsule 3  . Sodium Fluoride (CLINPRO 5000) 1.1 % PSTE Place 1 application onto teeth 2 (two) times daily. 112 g 0  . traMADol (ULTRAM) 50 MG tablet TAKE 1/2 TO 1 TABLET BY  MOUTH EVERY 8 HOURS AS  NEEDED FOR PAIN 90 tablet 0   No current facility-administered medications for this visit.    Review of Systems  Constitutional: Positive for weight loss (6 lb). Negative for chills, diaphoresis, fever and malaise/fatigue.       Feels "ok". Ambulating with a walker.   HENT: Negative.  Negative for congestion, ear pain, nosebleeds, sinus pain and sore throat.   Eyes: Negative.  Negative for blurred vision, double vision, photophobia and pain.  Respiratory: Negative.  Negative for cough, hemoptysis, sputum production and shortness of breath.   Cardiovascular: Negative for chest pain, palpitations, leg swelling (chronic) and PND.  Gastrointestinal: Positive for diarrhea. Negative for abdominal pain, blood in stool, constipation, heartburn, melena, nausea and vomiting.  Genitourinary: Negative.  Negative for dysuria, frequency, hematuria and urgency.  Musculoskeletal: Positive for back pain (chronic) and joint pain  (chronic LEFT knee "bone on bone" and left hip pain). Negative for falls, myalgias and neck pain.  Skin: Negative.  Negative for itching and rash.       Lumps under skin s/p injection.  Neurological: Positive for sensory change (stable neuropathy on gabapentin) and focal weakness. Negative for dizziness, tremors, speech change, weakness and headaches.       Chronic balance issues.  Endo/Heme/Allergies: Negative for environmental allergies. Does not bruise/bleed easily.       Allergy to pepper.  Psychiatric/Behavioral: Negative.  Negative for depression and memory loss. The patient is not nervous/anxious and does not have insomnia.   All  other systems reviewed and are negative.  Performance status (ECOG): 2 - Symptomatic, <50% confined to bed  Vitals Blood pressure (!) 156/67, pulse 65, temperature (!) 95.6 F (35.3 C), temperature source Tympanic, weight 194 lb 14.2 oz (88.4 kg), SpO2 95 %.   Physical Exam  Constitutional: She is oriented to person, place, and time. Vital signs are normal. She appears well-developed and well-nourished. No distress.  She has a rolling walker at her side. Needs some assistance on and off exam room table.  HENT:  Head: Normocephalic and atraumatic.  Mouth/Throat: No oropharyngeal exudate.  Long gray hair.  Eyes: Pupils are equal, round, and reactive to light. Conjunctivae and EOM are normal. Right eye exhibits no discharge. Left eye exhibits no discharge. No scleral icterus.  Glasses.  Neck: No JVD present.  Cardiovascular: Normal rate, regular rhythm and normal heart sounds. Exam reveals no gallop and no friction rub.  No murmur heard. Pulmonary/Chest: Effort normal and breath sounds normal. No respiratory distress. She has no wheezes. She has no rales. She exhibits no tenderness. Right breast exhibits no skin change (mild fibrocystic changes) and no tenderness. Left breast exhibits tenderness (scar tissue in upper outer quadrant). Left breast exhibits no  skin change.  Abdominal: Soft. Normal appearance and bowel sounds are normal. She exhibits no distension and no mass. There is no abdominal tenderness. There is no rebound and no guarding.  Musculoskeletal:        General: Tenderness (lower extremities) and edema (chronic ankle edema; mild) present. Normal range of motion.     Cervical back: Normal range of motion and neck supple.     Left hip: Tenderness present.  Lymphadenopathy:       Head (right side): No preauricular, no posterior auricular and no occipital adenopathy present.       Head (left side): No preauricular, no posterior auricular and no occipital adenopathy present.    She has no cervical adenopathy.    She has no axillary adenopathy.       Right: No supraclavicular adenopathy present.       Left: No supraclavicular adenopathy present.  Neurological: She is alert and oriented to person, place, and time.  Skin: Skin is warm and dry. No rash noted. She is not diaphoretic. No erythema. No pallor.  Psychiatric: She has a normal mood and affect. Her behavior is normal. Judgment and thought content normal.  Nursing note and vitals reviewed.   Imaging studies: 09/16/2017: PET scanrevealed widespread scattered hypermetabolic lytic osseous metastasesthroughout the axial and proximal appendicular skeleton. Bilateral proximal femoral skeletal metastases placed the patient at risk for pathologic hip fractures. There was mild hypermetabolic right hilar lymphadenopathy.  09/23/2017: Plain films of the bilateral femursrevealed no pathologic fracture. The LEFT view showed lytic subtrochanteric metastasis that was previously seen on PET scan.  06/01/2018: PET scandemonstrated marked improvementin the osseous metastatic disease. Previously demonstrated areas of abnormally high metabolic activity have resolved, with the exception of a very faint area of residual activity (SUV 2.6; previously 11.5) in the LEFT subtrochanteric region.  Previous LEFT axillary fluid collection now shows a 1.8 x 1.2 cm scarlike density with low-grade activity (SUV 2.7). Incidental findings include a small hiatal hernia and diverticulosis of the sigmoid colon.  02/11/2018: Lumbar spine MRI revealed multifocal lumbosacral vertebral body signal abnormalities consistent with osseous metastatic disease. The largest lesion was at L1. There was no pathologic compression fracture. There was multi-level moderate to severe neural foraminal stenosis, which had progressed since the prior study.  09/26/2018: Sacral MRI revealed scattered small marrow lesionsc/wmetastatic disease.The0.6 cm lesion in the S1 vertebral bodywasnew since the comparison lumbar spine MRI. Small lesion in the left ilium adjacent to the SI joint was present on the prior MRI. The remaining lesions werenot included on the prior MRI. There was nofracture or other acute abnormality.There was lower lumbar spondylosisand diverticulosis. 10/15/2018: PET scanrevealed no evidence of active breast cancer metastasis on FDG PET scan. There were stable lytic lesions in the spine without evidence of metabolic activity consistent treated metastasis. 05/20/2019:PET scanrevealed no evidence of breast cancer recurrence or progression.There were stable lytic lesions in the spine without associated metabolic activity.CT datawasdegraded by patient motion. 09/16/2019:  Bone density revealed osteopenia with a T-score of -1.9 at the forearm radius.  11/19/2019:  Bone scan revealed no signs of uptake to suggest metastatic disease. Some areas of increased uptake in the cervical and lumbar spine were more compatible with degenerative changes based on comparison with previous CT evaluation.   Appointment on 04/26/2020  Component Date Value Ref Range Status  . Sodium 04/26/2020 134* 135 - 145 mmol/L Final  . Potassium 04/26/2020 3.6  3.5 - 5.1 mmol/L Final  . Chloride 04/26/2020 97* 98 - 111  mmol/L Final  . CO2 04/26/2020 27  22 - 32 mmol/L Final  . Glucose, Bld 04/26/2020 98  70 - 99 mg/dL Final   Glucose reference range applies only to samples taken after fasting for at least 8 hours.  . BUN 04/26/2020 20  8 - 23 mg/dL Final  . Creatinine, Ser 04/26/2020 0.95  0.44 - 1.00 mg/dL Final  . Calcium 04/26/2020 9.2  8.9 - 10.3 mg/dL Final  . Total Protein 04/26/2020 7.5  6.5 - 8.1 g/dL Final  . Albumin 04/26/2020 4.0  3.5 - 5.0 g/dL Final  . AST 04/26/2020 20  15 - 41 U/L Final  . ALT 04/26/2020 12  0 - 44 U/L Final  . Alkaline Phosphatase 04/26/2020 79  38 - 126 U/L Final  . Total Bilirubin 04/26/2020 0.5  0.3 - 1.2 mg/dL Final  . GFR calc non Af Amer 04/26/2020 52* >60 mL/min Final  . GFR calc Af Amer 04/26/2020 >60  >60 mL/min Final  . Anion gap 04/26/2020 10  5 - 15 Final   Performed at [facility], [address], [place], [place] [address]  . WBC 04/26/2020 6.6  4.0 - 10.5 K/uL Final  . RBC 04/26/2020 4.45  3.87 - 5.11 MIL/uL Final  . Hemoglobin 04/26/2020 12.8  12.0 - 15.0 g/dL Final  . HCT 04/26/2020 38.7  36.0 - 46.0 % Final  . MCV 04/26/2020 87.0  80.0 - 100.0 fL Final  . MCH 04/26/2020 28.8  26.0 - 34.0 pg Final  . MCHC 04/26/2020 33.1  30.0 - 36.0 g/dL Final  . RDW 04/26/2020 14.4  11.5 - 15.5 % Final  . Platelets 04/26/2020 220  150 - 400 K/uL Final  . nRBC 04/26/2020 0.0  0.0 - 0.2 % Final  . Neutrophils Relative % 04/26/2020 63  % Final  . Neutro Abs 04/26/2020 4.2  1.7 - 7.7 K/uL Final  . Lymphocytes Relative 04/26/2020 22  % Final  . Lymphs Abs 04/26/2020 1.4  0.7 - 4.0 K/uL Final  . Monocytes Relative 04/26/2020 8  % Final  . Monocytes Absolute 04/26/2020 0.5  0.1 - 1.0 K/uL Final  . Eosinophils Relative 04/26/2020 6  % Final  . Eosinophils Absolute 04/26/2020 0.4  0.0 - 0.5 K/uL Final  .  Basophils Relative 04/26/2020 1  % Final  . Basophils Absolute 04/26/2020 0.1  0.0 - 0.1 K/uL Final  . Immature Granulocytes 04/26/2020 0  % Final  .  Abs Immature Granulocytes 04/26/2020 0.02  0.00 - 0.07 K/uL Final   Performed at [facility], [address] [place], [place] [address]    Assessment:  JAYDEE INGMAN is a 87 y.o. female with metastatic breast cancer s/p left axillary biopsy on 07/10/2017. Pathology revealed a 1.1 cm grade II invasive mammary carcinoma with focal lobular features. Wide excisionon 08/20/2017 revealed metastatic mammary carcinoma involving 1 of 4 axillary lymph nodes with extracapsular extension. The superior medial margin was involved. Tumor was ER + (>90%), PR + (>90%), and Her2/neu 2+ (FISH -).   [facility] genetic testingon 09/26/2017 revealed no pathogenic sequence variants or deletions/duplications. BRCA1/2 testing on 09/26/2017 was negative.  She has a history of left breast cancers/p wide excisionand sentinel lymph node biopsy on 07/18/1999 at [facility]. Pathology revealed a 0.8 x 0.6 x 0.4 cm grade II invasive ductal adenocarcinoma. There was no lymphovascular invasion. 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 Margins were negative. Tumor was ER + (50%) and PR + (90%). Pathologic stagewas T1bN0. She received 6200 cGyfrom 08/21/1999 - 10/03/1999. She completed 5 years of tamoxifenin 06/2004.   PET scanon 09/16/2017 revealed widespread scattered hypermetabolic lytic osseous metastases throughout the axial and proximal appendicular skeleton. Bilateral proximal femoral skeletal metastases placed the patient at risk for pathologic hip fractures. There was mild hypermetabolic right hilar lymphadenopathy. Plain films of the bilateral femurson 09/23/2017 revealed no pathologic fracture. The LEFT view showed lytic subtrochanteric metastasis that was previously seen on PET scan.   PET scan on 05/20/2019 revealedno evidence of breast cancer recurrence or progression.There were stable lytic lesions in the spine without associated metabolic activity.CT datawasdegraded  by patient motion.  Bone scan on 11/19/2019 showed no signs of uptake to suggest metastatic disease. Some areas of increased uptake in the cervical and lumbar spine were more compatible with degenerative changes based on comparison with previous CT evaluation.  CA27.29has been followed: 14.7 on 02/05/2017, 19.1 on 02/06/2012, 172.2 on 09/04/2017, 144.9 on 11/06/2017, 73.4 on 02/05/2018, 63.3 on 03/06/2018, 47.2 on 04/06/2018, 42.4 on 05/07/2018, 36.6 on 06/08/2018, 34.6 on 07/06/2018, 27.1 on 08/06/2018, 28.2 on 09/10/2018, 26.0 on 10/09/2018, 30.9 on 11/09/2018, 27.7 on 12/07/2018, 35.8 on 01/04/2019, 24.8 on 02/01/2019, 24.2 on 04/26/2019, 21.6 on 05/24/2019, 24.5 on 07/19/2019, 20.6 on 10/12/2019, 18.5 on 11/10/2019, 20.0 on 02/02/2020 and 20.1 on 04/26/2020.  She received a palliative course of radiation(3000 cGy in 10 fractions) to the left hip from 10/14/2017 - 10/27/2017. She received a course of palliative radiationthe the lumbar spine and SI joints from 03/04/2018 - 03/27/2018.  She began Faslodexon 09/26/2017 (last 03/29/2020). She began monthly Xgevaon 09/26/2017 (last 05/07/2018). She has dental issues.  Bone densityon 09/01/2017 revealed osteopeniawith a T-score of -1.9 in the right femoral neck and left forearm. Bone density on 09/16/2019 revealed osteopenia with a T-score of -1.9 at the forearm radius.  She is on calcium and vitamin D.  She received both doses of the [place] COVID-19 vaccine in 03/2020.   Symptomatically, she has felt ok.  Her left breast has been sore.  She notes some back discomfort.  Exam is stable.  Plan: 1.   Labs today: CBC with diff, CMP, CA 27.29. 2.Metastatic breast cancer Clinically, she appears to be doing well Exam exam is stable.  CA 27.29 is 20.1 (stable). PET  scan on06/10/2019 revealed no evidence of progression. Bone scan on 11/19/2019 reveled no evidence of active metastatic disease. Labs reviewed.  Faslodex  today. Continue Faslodex monthly. 3.Bone metastasis Patient had no evidence of metastatic disease on PET scan from 05/20/2019.  No evidence of active metastatic disease on recent bone scan. Delton See presently on hold secondary to dental issues and clearance. 4.Osteopenia Continue calcium and vitamin D. Await clearance for possible future Prolia. 5.Faslodex today 6.   RN to call [facility] re: pepper allergy. 7.   RTC monthly x2 for Faslodex 8.   RTC in 3 months for MD assessment, labs (CBC with diff CMP, CA 27.29) and Faslodex.  I discussed the assessment and treatment plan with the patient.  The patient was provided an opportunity to ask questions and all were answered.  The patient agreed with the plan and demonstrated an understanding of the instructions.  The patient was advised to call back if the symptoms worsen or if the condition fails to improve as anticipated.   Lequita Asal, MD, PhD    04/26/2020, 11:25 AM  I, Heywood Footman, am acting as a Education administrator for Lequita Asal, MD.  I, Bell Mike Gip, MD, have reviewed the above documentation for accuracy and completeness, and I agree with the above.

## 2020-04-25 ENCOUNTER — Other Ambulatory Visit: Payer: Self-pay

## 2020-04-25 DIAGNOSIS — G5790 Unspecified mononeuropathy of unspecified lower limb: Secondary | ICD-10-CM | POA: Insufficient documentation

## 2020-04-25 NOTE — Progress Notes (Signed)
Patient pre screened for office appointment, no questions or concerns today. Patient reminded of upcoming appointment time and date. 

## 2020-04-26 ENCOUNTER — Inpatient Hospital Stay: Payer: Medicare Other

## 2020-04-26 ENCOUNTER — Encounter: Payer: Self-pay | Admitting: Hematology and Oncology

## 2020-04-26 ENCOUNTER — Inpatient Hospital Stay: Payer: Medicare Other | Attending: Hematology and Oncology | Admitting: Hematology and Oncology

## 2020-04-26 ENCOUNTER — Telehealth: Payer: Self-pay

## 2020-04-26 VITALS — BP 156/67 | HR 65 | Temp 95.6°F | Wt 194.9 lb

## 2020-04-26 DIAGNOSIS — M85851 Other specified disorders of bone density and structure, right thigh: Secondary | ICD-10-CM | POA: Diagnosis not present

## 2020-04-26 DIAGNOSIS — C7951 Secondary malignant neoplasm of bone: Secondary | ICD-10-CM | POA: Diagnosis not present

## 2020-04-26 DIAGNOSIS — Z170 Estrogen receptor positive status [ER+]: Secondary | ICD-10-CM

## 2020-04-26 DIAGNOSIS — C773 Secondary and unspecified malignant neoplasm of axilla and upper limb lymph nodes: Secondary | ICD-10-CM | POA: Diagnosis not present

## 2020-04-26 DIAGNOSIS — C50412 Malignant neoplasm of upper-outer quadrant of left female breast: Secondary | ICD-10-CM

## 2020-04-26 DIAGNOSIS — Z5111 Encounter for antineoplastic chemotherapy: Secondary | ICD-10-CM | POA: Diagnosis not present

## 2020-04-26 LAB — COMPREHENSIVE METABOLIC PANEL
ALT: 12 U/L (ref 0–44)
AST: 20 U/L (ref 15–41)
Albumin: 4 g/dL (ref 3.5–5.0)
Alkaline Phosphatase: 79 U/L (ref 38–126)
Anion gap: 10 (ref 5–15)
BUN: 20 mg/dL (ref 8–23)
CO2: 27 mmol/L (ref 22–32)
Calcium: 9.2 mg/dL (ref 8.9–10.3)
Chloride: 97 mmol/L — ABNORMAL LOW (ref 98–111)
Creatinine, Ser: 0.95 mg/dL (ref 0.44–1.00)
GFR calc Af Amer: >60 mL/min (ref >60)
GFR calc non Af Amer: 52 mL/min — ABNORMAL LOW (ref >60)
Glucose, Bld: 98 mg/dL (ref 70–99)
Potassium: 3.6 mmol/L (ref 3.5–5.1)
Sodium: 134 mmol/L — ABNORMAL LOW (ref 135–145)
Total Bilirubin: 0.5 mg/dL (ref 0.3–1.2)
Total Protein: 7.5 g/dL (ref 6.5–8.1)

## 2020-04-26 LAB — CBC WITH DIFFERENTIAL/PLATELET
Abs Immature Granulocytes: 0.02 10*3/uL (ref 0.00–0.07)
Basophils Absolute: 0.1 10*3/uL (ref 0.0–0.1)
Basophils Relative: 1 %
Eosinophils Absolute: 0.4 10*3/uL (ref 0.0–0.5)
Eosinophils Relative: 6 %
HCT: 38.7 % (ref 36.0–46.0)
Hemoglobin: 12.8 g/dL (ref 12.0–15.0)
Immature Granulocytes: 0 %
Lymphocytes Relative: 22 %
Lymphs Abs: 1.4 10*3/uL (ref 0.7–4.0)
MCH: 28.8 pg (ref 26.0–34.0)
MCHC: 33.1 g/dL (ref 30.0–36.0)
MCV: 87 fL (ref 80.0–100.0)
Monocytes Absolute: 0.5 10*3/uL (ref 0.1–1.0)
Monocytes Relative: 8 %
Neutro Abs: 4.2 10*3/uL (ref 1.7–7.7)
Neutrophils Relative %: 63 %
Platelets: 220 10*3/uL (ref 150–400)
RBC: 4.45 MIL/uL (ref 3.87–5.11)
RDW: 14.4 % (ref 11.5–15.5)
WBC: 6.6 10*3/uL (ref 4.0–10.5)
nRBC: 0 % (ref 0.0–0.2)

## 2020-04-26 MED ORDER — FULVESTRANT 250 MG/5ML IM SOLN
500.0000 mg | Freq: Once | INTRAMUSCULAR | Status: AC
  Administered 2020-04-26: 500 mg via INTRAMUSCULAR

## 2020-04-26 NOTE — Telephone Encounter (Signed)
Left message for the [facility] to call me back regarding patients pepper allergy

## 2020-04-27 LAB — CANCER ANTIGEN 27.29: CA 27.29: 20.1 U/mL (ref 0.0–38.6)

## 2020-05-10 ENCOUNTER — Telehealth: Payer: Self-pay | Admitting: Hematology and Oncology

## 2020-05-10 NOTE — Telephone Encounter (Signed)
Patient phoned on this date and stated that she had previously called about the August appts and the June and July appts were changed instead. Patient stated that she would like the June and July appts to be rescheduled to their previous times. June and July appts changed back to original times per patient's request. Patient also stated that the time for her appt in Aug was too early for her and needed a later time. Appts moved to a later time for patient per patient's request.

## 2020-05-20 ENCOUNTER — Other Ambulatory Visit: Payer: Self-pay | Admitting: Internal Medicine

## 2020-05-20 DIAGNOSIS — G5793 Unspecified mononeuropathy of bilateral lower limbs: Secondary | ICD-10-CM

## 2020-05-20 DIAGNOSIS — I10 Essential (primary) hypertension: Secondary | ICD-10-CM

## 2020-05-20 DIAGNOSIS — K589 Irritable bowel syndrome without diarrhea: Secondary | ICD-10-CM

## 2020-05-20 NOTE — Telephone Encounter (Signed)
Requested Prescriptions  Pending Prescriptions Disp Refills  . lisinopril-hydrochlorothiazide (ZESTORETIC) 20-25 MG tablet [Pharmacy Med Name: LISINOPRIL/HCTZ 20-25MG  TABLET] 135 tablet 3    Sig: TAKE 1 AND 1/2 TABLETS BY  MOUTH DAILY     Cardiovascular:  ACEI + Diuretic Combos Failed - 05/20/2020  9:57 PM      Failed - Na in normal range and within 180 days    Sodium  Date Value Ref Range Status  04/26/2020 134 (L) 135 - 145 mmol/L Final  06/27/2017 137 134 - 144 mmol/L Final  02/06/2012 139 136 - 145 mmol/L Final         Failed - Last BP in normal range    BP Readings from Last 1 Encounters:  04/26/20 (!) 156/67         Passed - K in normal range and within 180 days    Potassium  Date Value Ref Range Status  04/26/2020 3.6 3.5 - 5.1 mmol/L Final  11/27/2012 3.7 3.5 - 5.1 mmol/L Final         Passed - Cr in normal range and within 180 days    Creatinine  Date Value Ref Range Status  02/06/2012 0.76 0.60 - 1.30 mg/dL Final   Creatinine, Ser  Date Value Ref Range Status  04/26/2020 0.95 0.44 - 1.00 mg/dL Final         Passed - Ca in normal range and within 180 days    Calcium  Date Value Ref Range Status  04/26/2020 9.2 8.9 - 10.3 mg/dL Final   Calcium, Total  Date Value Ref Range Status  02/06/2012 8.8 8.5 - 10.1 mg/dL Final         Passed - Patient is not pregnant      Passed - Valid encounter within last 6 months    Recent Outpatient Visits          4 months ago Essential hypertension   [place], Laura H, MD   11 months ago Essential hypertension   [place], Laura H, MD   1 year ago Essential hypertension   [place], Laura H, MD   1 year ago Essential hypertension   [facility] Glean Hess, MD   2 years ago Medicare annual wellness visit, subsequent   [facility] Glean Hess, MD      Future Appointments            In 1 month Glean Hess, MD [facility]           . meloxicam ([place]) 15 MG tablet [Pharmacy Med Name: MELOXICAM  15MG   TAB] 90 tablet 3    Sig: TAKE 1 TABLET BY MOUTH  DAILY     Analgesics:  COX2 Inhibitors Passed - 05/20/2020  9:57 PM      Passed - HGB in normal range and within 360 days    Hemoglobin  Date Value Ref Range Status  04/26/2020 12.8 12.0 - 15.0 g/dL Final  06/27/2017 13.1 11.1 - 15.9 g/dL Final         Passed - Cr in normal range and within 360 days    Creatinine  Date Value Ref Range Status  02/06/2012 0.76 0.60 - 1.30 mg/dL Final   Creatinine, Ser  Date Value Ref Range Status  04/26/2020 0.95 0.44 - 1.00 mg/dL Final         Passed - Patient is not pregnant  Passed - Valid encounter within last 12 months    Recent Outpatient Visits          4 months ago Essential hypertension   [place], MD   11 months ago Essential hypertension   [facility] Glean Hess, MD   1 year ago Essential hypertension   [facility] Glean Hess, MD   1 year ago Essential hypertension   [facility] Glean Hess, MD   2 years ago Medicare annual wellness visit, subsequent   [facility] Glean Hess, MD      Future Appointments            In 1 month [facility] Melia Jesse Sans, MD [facility] [place]           . omeprazole (PRILOSEC) 20 MG capsule [Pharmacy Med Name: OMEPRAZOLE  20MG   CAP] 90 capsule 3    Sig: TAKE 1 CAPSULE BY MOUTH  DAILY     Gastroenterology: Proton Pump Inhibitors Passed - 05/20/2020  9:57 PM      Passed - Valid encounter within last 12 months    Recent Outpatient Visits          4 months ago Essential hypertension   [facility] Glean Hess, MD   11 months ago Essential hypertension   [facility] Glean Hess, MD   1 year ago Essential hypertension   [facility] Glean Hess, MD   1 year ago Essential hypertension   [facility] Glean Hess, MD   2 years ago Medicare annual wellness visit, subsequent   [facility] Glean Hess, MD      Future Appointments            In 1 month Army Melia Jesse Sans, MD [facility]

## 2020-05-29 ENCOUNTER — Inpatient Hospital Stay: Payer: Medicare Other

## 2020-05-30 ENCOUNTER — Other Ambulatory Visit: Payer: Self-pay

## 2020-05-30 ENCOUNTER — Inpatient Hospital Stay: Payer: Medicare Other

## 2020-05-30 ENCOUNTER — Inpatient Hospital Stay: Payer: Medicare Other | Attending: Hematology and Oncology

## 2020-05-30 DIAGNOSIS — M85851 Other specified disorders of bone density and structure, right thigh: Secondary | ICD-10-CM

## 2020-05-30 DIAGNOSIS — Z5111 Encounter for antineoplastic chemotherapy: Secondary | ICD-10-CM | POA: Insufficient documentation

## 2020-05-30 DIAGNOSIS — C50412 Malignant neoplasm of upper-outer quadrant of left female breast: Secondary | ICD-10-CM | POA: Diagnosis not present

## 2020-05-30 MED ORDER — FULVESTRANT 250 MG/5ML IM SOLN
500.0000 mg | Freq: Once | INTRAMUSCULAR | Status: AC
  Administered 2020-05-30: 500 mg via INTRAMUSCULAR
  Filled 2020-05-30: qty 10

## 2020-06-21 ENCOUNTER — Encounter: Payer: Self-pay | Admitting: Internal Medicine

## 2020-06-21 ENCOUNTER — Other Ambulatory Visit: Payer: Self-pay

## 2020-06-21 ENCOUNTER — Ambulatory Visit (INDEPENDENT_AMBULATORY_CARE_PROVIDER_SITE_OTHER): Payer: Medicare Other | Admitting: Internal Medicine

## 2020-06-21 VITALS — BP 132/62 | HR 71 | Temp 98.0°F | Ht 64.0 in | Wt 197.0 lb

## 2020-06-21 DIAGNOSIS — E785 Hyperlipidemia, unspecified: Secondary | ICD-10-CM | POA: Diagnosis not present

## 2020-06-21 DIAGNOSIS — C7951 Secondary malignant neoplasm of bone: Secondary | ICD-10-CM

## 2020-06-21 DIAGNOSIS — G5793 Unspecified mononeuropathy of bilateral lower limbs: Secondary | ICD-10-CM

## 2020-06-21 DIAGNOSIS — I700 Atherosclerosis of aorta: Secondary | ICD-10-CM

## 2020-06-21 DIAGNOSIS — I10 Essential (primary) hypertension: Secondary | ICD-10-CM | POA: Diagnosis not present

## 2020-06-21 NOTE — Patient Instructions (Signed)
Take an extra gabapentin in the mid-day as needed for neuropathic pain.

## 2020-06-21 NOTE — Progress Notes (Signed)
Date:  06/21/2020   Name:  Kelli Williams   DOB:  10/02/1926   MRN:  [id number]   Chief Complaint: Hypertension (6 month follow up. )  Hypertension This is a chronic problem. The problem is controlled. Associated symptoms include palpitations. Pertinent negatives include no chest pain, headaches or shortness of breath. Past treatments include ACE inhibitors and diuretics. The current treatment provides significant improvement. There are no compliance problems.    Metastatic breast cancer - she continues to follow up with Oncology and is under an active treatment plan.  She has some mild to moderate pain in her right lower back and hip for which she takes tramadol rarely. She is receiving infusions for osteoporosis from the cancer center -Xvega and faslodex.  Neuropathy - taking gabapentin  900 mg bid. She often has breakthrough symptoms when she is more active.   Lab Results  Component Value Date   CREATININE 0.95 04/26/2020   BUN 20 04/26/2020   NA 134 (L) 04/26/2020   K 3.6 04/26/2020   CL 97 (L) 04/26/2020   CO2 27 04/26/2020   Lab Results  Component Value Date   CHOL 154 09/07/2019   HDL 57 09/07/2019   LDLCALC 82 09/07/2019   TRIG 78 09/07/2019   CHOLHDL 2.7 09/07/2019   Lab Results  Component Value Date   TSH 1.606 03/01/2019   No results found for: HGBA1C Lab Results  Component Value Date   WBC 6.6 04/26/2020   HGB 12.8 04/26/2020   HCT 38.7 04/26/2020   MCV 87.0 04/26/2020   PLT 220 04/26/2020   Lab Results  Component Value Date   ALT 12 04/26/2020   AST 20 04/26/2020   ALKPHOS 79 04/26/2020   BILITOT 0.5 04/26/2020     Review of Systems  Constitutional: Negative for chills, fatigue, fever and unexpected weight change.  Respiratory: Negative for cough, chest tightness and shortness of breath.   Cardiovascular: Positive for palpitations and leg swelling. Negative for chest pain.  Gastrointestinal: Negative for abdominal pain and blood in stool.    Genitourinary: Negative for dysuria.  Musculoskeletal: Positive for arthralgias, gait problem and myalgias.  Neurological: Negative for dizziness, light-headedness and headaches.  Psychiatric/Behavioral: Negative for sleep disturbance. The patient is not nervous/anxious.     Patient Active Problem List   Diagnosis Date Noted  . Lower extremity neuropathy 04/25/2020  . Dental disease 10/13/2019  . Lumbar pain 04/26/2019  . Dental cavity 08/10/2018  . PAD (peripheral artery disease) ([place]) 07/01/2018  . Aortic atherosclerosis ([place]) 07/01/2018  . Coronary artery calcification seen on CT scan 07/01/2018  . Cancer associated pain 02/05/2018  . Goals of care, counseling/discussion 11/24/2017  . Candida infection 11/24/2017  . Esophageal reflux 10/28/2017  . Postoperative seroma of subcutaneous tissue after non-dermatologic procedure 10/22/2017  . Bone metastases ([place]) 09/23/2017  . Osteopenia 09/08/2017  . Breast cancer of upper-outer quadrant of left female breast ([place]) 07/10/2017  . Irritable bowel syndrome without diarrhea 06/27/2017  . Oral lesion 06/27/2017  . BMI 33.0-33.9,adult 06/27/2017  . OA (osteoarthritis) of knee 10/16/2016  . Leg swelling 10/11/2015  . Neuropathy involving both lower extremities 06/19/2015  . Secondary localized osteoarthrosis, ankle and foot 06/01/2015  . Acne erythematosa 06/01/2015  . Avitaminosis D 06/01/2015  . Essential hypertension 08/17/2012  . History of breast cancer 08/17/2012    Allergies  Allergen Reactions  . Amitriptyline Hives  . Amoxicillin     Diarrhea    . Black Pepper [[place]]   .  Ivp Dye [Iodinated Diagnostic Agents]   . Tape Other (See Comments)    [place] - please use paper tape.  . Sulfa Antibiotics Rash  . Sulfa Antibiotics Rash    Past Surgical History:  Procedure Laterality Date  . ABDOMINAL HYSTERECTOMY    . AXILLARY LYMPH NODE BIOPSY Left 07/10/2017   INVASIVE MAMMARY CARCINOMA WITH FOCAL LOBULAR FEATURES.   Marland Kitchen  BREAST BIOPSY Bilateral 1960   benign  . BREAST LUMPECTOMY Left 2000   breast ca with rad tx  . BREAST LUMPECTOMY Left 08/20/2017  . BREAST LUMPECTOMY Left 08/20/2017   Procedure: left breast wide excision;  Surgeon: Robert Bellow, MD;  Location: [facility] ORS;  Service: General;  Laterality: Left;  . CATARACT EXTRACTION     left eye  . CHOLECYSTECTOMY    . JOINT REPLACEMENT    . NASAL SEPTUM SURGERY    . PARTIAL KNEE ARTHROPLASTY     right  . TOTAL VAGINAL HYSTERECTOMY      Social History   Tobacco Use  . Smoking status: Never Smoker  . Smokeless tobacco: Never Used  . Tobacco comment: smoking cessation materials not required  Vaping Use  . Vaping Use: Never used  Substance Use Topics  . Alcohol use: No    Alcohol/week: 0.0 standard drinks  . Drug use: No     Medication list has been reviewed and updated.  Current Meds  Medication Sig  . acetaminophen (TYLENOL) 325 MG tablet Take 650 mg by mouth every 6 (six) hours as needed.  Marland Kitchen aspirin 81 MG tablet Take 1 tablet by mouth daily.  . Calcium-Magnesium 500-250 MG TABS Take 1 tablet by mouth daily.   . Cholecalciferol (VITAMIN D3) 25 MCG (1000 UT) CAPS Take 1 capsule by mouth daily.   . clindamycin (CLEOCIN) 300 MG capsule Take 300 mg by mouth once. Prior to dental appointment  . Coenzyme Q10 (CO Q-10) 100 MG CAPS Take 1 tablet by mouth daily.   Marland Kitchen ezetimibe (ZETIA) 10 MG tablet TAKE 1 TABLET BY MOUTH  DAILY  . gabapentin (NEURONTIN) 300 MG capsule TAKE 3 CAPSULES BY MOUTH  TWICE DAILY  . lisinopril-hydrochlorothiazide (ZESTORETIC) 20-25 MG tablet TAKE 1 AND 1/2 TABLETS BY  MOUTH DAILY  . meloxicam (MOBIC) 15 MG tablet TAKE 1 TABLET BY MOUTH  DAILY  . Multiple Vitamins-Minerals (MULTIVITAMIN WITH MINERALS) tablet Take 1 tablet by mouth daily.  Marland Kitchen omeprazole (PRILOSEC) 20 MG capsule TAKE 1 CAPSULE BY MOUTH  DAILY  . Sodium Fluoride (CLINPRO 5000) 1.1 % PSTE Place 1 application onto teeth 2 (two) times daily.  . traMADol  (ULTRAM) 50 MG tablet TAKE 1/2 TO 1 TABLET BY  MOUTH EVERY 8 HOURS AS  NEEDED FOR PAIN    PHQ 2/9 Scores 06/21/2020 12/23/2019 10/13/2019 06/21/2019  PHQ - 2 Score 0 0 0 0  PHQ- 9 Score 0 2 - 0    GAD 7 : Generalized Anxiety Score 06/21/2020  Nervous, Anxious, on Edge 0  Control/stop worrying 0  Worry too much - different things 0  Trouble relaxing 0  Restless 0  Easily annoyed or irritable 0  Afraid - awful might happen 0  Total GAD 7 Score 0  Anxiety Difficulty Not difficult at all    BP Readings from Last 3 Encounters:  06/21/20 132/62  04/26/20 (!) 156/67  02/02/20 140/70    Physical Exam Vitals and nursing note reviewed.  Constitutional:      General: She is not in acute distress.  Appearance: She is well-developed.  HENT:     Head: Normocephalic and atraumatic.  Cardiovascular:     Rate and Rhythm: Normal rate and regular rhythm.     Pulses: Normal pulses.  Pulmonary:     Effort: Pulmonary effort is normal. No respiratory distress.     Breath sounds: No wheezing or rhonchi.  Musculoskeletal:        General: Tenderness present.     Cervical back: Normal range of motion.     Right lower leg: Edema present.     Left lower leg: Edema present.  Lymphadenopathy:     Cervical: No cervical adenopathy.  Skin:    General: Skin is warm and dry.     Findings: No rash.  Neurological:     General: No focal deficit present.     Mental Status: She is alert and oriented to person, place, and time.     Gait: Gait abnormal (uses rolator).  Psychiatric:        Mood and Affect: Mood normal.        Behavior: Behavior normal.     Wt Readings from Last 3 Encounters:  06/21/20 197 lb (89.4 kg)  04/26/20 194 lb 14.2 oz (88.4 kg)  02/02/20 200 lb (90.7 kg)    BP 132/62 (BP Location: Right Arm, Patient Position: Sitting, Cuff Size: Large)   Pulse 71   Temp 98 F (36.7 C) (Oral)   Ht 5\' 4"  (1.626 m)   Wt 197 lb (89.4 kg)   SpO2 95%   BMI 33.81 kg/m   Assessment and  Plan: 1. Essential hypertension Clinically stable exam with well controlled BP. Tolerating medications without side effects at this time. Pt to continue current regimen and low sodium diet; benefits of regular exercise as able discussed.  2. Aortic atherosclerosis ([place]) Now on zetia With check lipids and pt will follow up with Cardiology  3. Neuropathy involving both lower extremities Continue 900 mg bid May take an extra 300 mg gabapentin PRN  4. Bone metastases ([place]) On Xvega and Faslodex - being followed closely by Oncology  5. Hyperlipidemia, unspecified hyperlipidemia type Now on zetia  - Lipid panel   Partially dictated using Editor, commissioning. Any errors are unintentional.  Halina Maidens, MD [facility]  06/21/2020

## 2020-06-26 ENCOUNTER — Inpatient Hospital Stay: Payer: Medicare Other

## 2020-06-27 ENCOUNTER — Inpatient Hospital Stay: Payer: Medicare Other

## 2020-06-27 ENCOUNTER — Other Ambulatory Visit: Payer: Self-pay

## 2020-06-27 ENCOUNTER — Inpatient Hospital Stay: Payer: Medicare Other | Attending: Hematology and Oncology

## 2020-06-27 DIAGNOSIS — C50412 Malignant neoplasm of upper-outer quadrant of left female breast: Secondary | ICD-10-CM | POA: Diagnosis not present

## 2020-06-27 DIAGNOSIS — C7951 Secondary malignant neoplasm of bone: Secondary | ICD-10-CM | POA: Insufficient documentation

## 2020-06-27 DIAGNOSIS — Z5111 Encounter for antineoplastic chemotherapy: Secondary | ICD-10-CM | POA: Diagnosis not present

## 2020-06-27 DIAGNOSIS — M85851 Other specified disorders of bone density and structure, right thigh: Secondary | ICD-10-CM

## 2020-06-27 MED ORDER — FULVESTRANT 250 MG/5ML IM SOLN
500.0000 mg | Freq: Once | INTRAMUSCULAR | Status: AC
  Administered 2020-06-27: 500 mg via INTRAMUSCULAR
  Filled 2020-06-27: qty 10

## 2020-07-26 ENCOUNTER — Other Ambulatory Visit: Payer: Self-pay | Admitting: Internal Medicine

## 2020-07-26 ENCOUNTER — Other Ambulatory Visit: Payer: Self-pay | Admitting: Cardiovascular Disease

## 2020-07-26 DIAGNOSIS — G5793 Unspecified mononeuropathy of bilateral lower limbs: Secondary | ICD-10-CM

## 2020-07-26 NOTE — Telephone Encounter (Signed)
Requested Prescriptions  Pending Prescriptions Disp Refills   gabapentin (NEURONTIN) 300 MG capsule [Pharmacy Med Name: Gabapentin 300 MG Oral Capsule] 540 capsule 3    Sig: TAKE 3 CAPSULES BY MOUTH  TWICE DAILY     Neurology: Anticonvulsants - gabapentin Passed - 07/26/2020  9:52 PM      Passed - Valid encounter within last 12 months    Recent Outpatient Visits          1 month ago Essential hypertension   [facility] Glean Hess, MD   7 months ago Essential hypertension   [facility] Glean Hess, MD   1 year ago Essential hypertension   [facility] Glean Hess, MD   1 year ago Essential hypertension   [facility] Glean Hess, MD   1 year ago Essential hypertension   [facility] Glean Hess, MD      Future Appointments            In 2 months Gollan, Kathlene November, MD [facility], [place]

## 2020-07-26 NOTE — Progress Notes (Signed)
[facility]  [address] [place], [place] [address] Phone: [phone]  Fax: [phone]   Clinic Day:  07/27/2020  Referring physician: Glean Hess, MD  Chief Complaint: Kelli Williams is a 86 y.o. female with metastatic breast cancer who is seen for a 3 month assessment and continuation of monthly Faslodex.    HPI: The patient was last seen in the medical onoclogy clinic on 04/26/2020. At that time, she felt "ok". Her left breast had been sore. She noted some back discomfort.  Exam was stable. CBC was normal. CA 27.29 was 20.1. She received Faslodex. She continued calcium and vitamin D.   She received Faslodex on 05/30/2020 and 06/27/2020.    During the interim, she was doing ok. Her diarrhea resolved. She has chronic back and knee pain. Her neuropathy and balance are about the same.    Past Medical History:  Diagnosis Date  . Anxiety   . Arthritis    BOTH FEET AND KNEES  . Breast cancer ([place]) 2000   left breast ca with lumpectomy and rad tx 5 yr tamoxifen/Dr Jeannine Kitten at [place]  . Complication of anesthesia    WOKE UP DURING SURGERY FOR DEVIATED SEPTUM, KNEE REPLACEMENT AND BREAST LUMPECTOMY  . Dysrhythmia   . GERD (gastroesophageal reflux disease)   . Hypertension   . Neuropathic pain of both legs   . Neuropathy   . Personal history of chemotherapy   . Personal history of radiation therapy   . Pneumonia 2016  . Skin cancer of forehead   . Skin cancer of nose     Past Surgical History:  Procedure Laterality Date  . ABDOMINAL HYSTERECTOMY    . AXILLARY LYMPH NODE BIOPSY Left 07/10/2017   INVASIVE MAMMARY CARCINOMA WITH FOCAL LOBULAR FEATURES.   Marland Kitchen BREAST BIOPSY Bilateral 1960   benign  . BREAST LUMPECTOMY Left 2000   breast ca with rad tx  . BREAST LUMPECTOMY Left 08/20/2017  . BREAST LUMPECTOMY Left 08/20/2017   Procedure: left breast wide excision;  Surgeon: Robert Bellow, MD;  Location: [facility] ORS;  Service: General;   Laterality: Left;  . CATARACT EXTRACTION     left eye  . CHOLECYSTECTOMY    . JOINT REPLACEMENT    . NASAL SEPTUM SURGERY    . PARTIAL KNEE ARTHROPLASTY     right  . TOTAL VAGINAL HYSTERECTOMY      Family History  Problem Relation Age of Onset  . Heart disease Mother   . Heart attack Paternal Uncle   . Breast cancer Daughter 15       genetic negative  . Cancer Paternal Grandmother     Social History:  reports that she has never smoked. She has never used smokeless tobacco. She reports that she does not drink alcohol and does not use drugs. The patient's husband died 2 years ago.She was born outside of [place].She previously worked as an Web designer. She lives at the [facility] [place]. She is independent except for the use of her walker. Patient has a daughter, Telsa Dillavou. The patient is alone today.   Allergies:  Allergies  Allergen Reactions  . Amitriptyline Hives  . Amoxicillin     Diarrhea    . Black Pepper [[place]]   . Ivp Dye [Iodinated Diagnostic Agents]   . Tape Other (See Comments)    [place] - please use paper tape.  . Sulfa Antibiotics Rash  . Sulfa Antibiotics Rash    Current Medications: Current  Outpatient Medications  Medication Sig Dispense Refill  . acetaminophen (TYLENOL) 325 MG tablet Take 650 mg by mouth every 6 (six) hours as needed.    Marland Kitchen aspirin 81 MG tablet Take 1 tablet by mouth daily.    . Calcium-Magnesium 500-250 MG TABS Take 1 tablet by mouth daily.     . Cholecalciferol (VITAMIN D3) 25 MCG (1000 UT) CAPS Take 1 capsule by mouth daily.     . clindamycin (CLEOCIN) 300 MG capsule Take 300 mg by mouth once. Prior to dental appointment    . Coenzyme Q10 (CO Q-10) 100 MG CAPS Take 1 tablet by mouth daily.     Marland Kitchen ezetimibe (ZETIA) 10 MG tablet TAKE 1 TABLET BY MOUTH  DAILY 90 tablet 3  . gabapentin (NEURONTIN) 300 MG capsule TAKE 3 CAPSULES BY MOUTH  TWICE DAILY 540 capsule 3  . lisinopril-hydrochlorothiazide (ZESTORETIC) 20-25  MG tablet TAKE 1 AND 1/2 TABLETS BY  MOUTH DAILY 135 tablet 0  . meloxicam (MOBIC) 15 MG tablet TAKE 1 TABLET BY MOUTH  DAILY 90 tablet 3  . Multiple Vitamins-Minerals (MULTIVITAMIN WITH MINERALS) tablet Take 1 tablet by mouth daily.    Marland Kitchen omeprazole (PRILOSEC) 20 MG capsule TAKE 1 CAPSULE BY MOUTH  DAILY 90 capsule 3  . Sodium Fluoride (CLINPRO 5000) 1.1 % PSTE Place 1 application onto teeth 2 (two) times daily. 112 g 0  . traMADol (ULTRAM) 50 MG tablet TAKE 1/2 TO 1 TABLET BY  MOUTH EVERY 8 HOURS AS  NEEDED FOR PAIN 90 tablet 0   No current facility-administered medications for this visit.    Review of Systems  Constitutional: Negative for chills, diaphoresis, fever, malaise/fatigue and weight loss (stable).       Doing ok. Ambulating with a walker.   HENT: Negative.  Negative for congestion, ear pain, nosebleeds, sinus pain and sore throat.   Eyes: Negative.  Negative for blurred vision, double vision, photophobia and pain.  Respiratory: Negative.  Negative for cough, hemoptysis, sputum production and shortness of breath.   Cardiovascular: Negative for chest pain, palpitations, leg swelling (chronic) and PND.  Gastrointestinal: Negative for abdominal pain, blood in stool, constipation, diarrhea (resolved), heartburn, melena, nausea and vomiting.  Genitourinary: Negative.  Negative for dysuria, frequency, hematuria and urgency.  Musculoskeletal: Positive for back pain (chronic) and joint pain (chronic LEFT knee "bone on bone" and left hip pain). Negative for falls, myalgias and neck pain.  Skin: Negative.  Negative for itching and rash.       Lumps under skin s/p injection.  Neurological: Positive for sensory change (stable neuropathy on gabapentin). Negative for dizziness, tremors, speech change, focal weakness, weakness and headaches.       Chronic balance issues.  Endo/Heme/Allergies: Negative for environmental allergies. Does not bruise/bleed easily.       Allergy to pepper.    Psychiatric/Behavioral: Negative.  Negative for depression and memory loss. The patient is not nervous/anxious and does not have insomnia.   All other systems reviewed and are negative.  Performance status (ECOG): 2  Vitals Blood pressure 132/86, pulse (!) 56, temperature 97.8 F (36.6 C), resp. rate 20, weight 195 lb 15.8 oz (88.9 kg), SpO2 97 %.   Physical Exam Vitals and nursing note reviewed.  Constitutional:      General: She is not in acute distress.    Appearance: Normal appearance. She is well-developed. She is not diaphoretic.     Comments: She has a rolling walker at her side. Needs some assistance on and  off exam room table.  HENT:     Head: Normocephalic and atraumatic.     Comments: Pearline Cables hair pulled back.    Mouth/Throat:     Pharynx: No oropharyngeal exudate.  Eyes:     General: No scleral icterus.       Right eye: No discharge.        Left eye: No discharge.     Conjunctiva/sclera: Conjunctivae normal.     Pupils: Pupils are equal, round, and reactive to light.     Comments: Glasses.  Neck:     Vascular: No JVD.  Cardiovascular:     Rate and Rhythm: Normal rate and regular rhythm.     Heart sounds: Normal heart sounds. No murmur heard.  No friction rub. No gallop.   Pulmonary:     Effort: Pulmonary effort is normal. No respiratory distress.     Breath sounds: Normal breath sounds. No wheezing or rales.  Abdominal:     General: Bowel sounds are normal. There is no distension.     Palpations: Abdomen is soft. There is no mass.     Tenderness: There is no abdominal tenderness. There is no guarding or rebound.  Musculoskeletal:        General: Tenderness (lower extremities) present. Normal range of motion.     Cervical back: Normal range of motion and neck supple.     Left hip: Tenderness present.     Right lower leg: Edema present.     Left lower leg: Edema present.  Lymphadenopathy:     Head:     Right side of head: No preauricular, posterior auricular  or occipital adenopathy.     Left side of head: No preauricular, posterior auricular or occipital adenopathy.     Cervical: No cervical adenopathy.     Upper Body:     Right upper body: No supraclavicular or axillary adenopathy.     Left upper body: No supraclavicular or axillary adenopathy.     Lower Body: No right inguinal adenopathy. No left inguinal adenopathy.  Skin:    General: Skin is warm and dry.     Coloration: Skin is not pale.     Findings: No erythema or rash.  Neurological:     Mental Status: She is alert and oriented to person, place, and time.  Psychiatric:        Behavior: Behavior normal.        Thought Content: Thought content normal.        Judgment: Judgment normal.    Imaging studies: 09/16/2017: PET scanrevealed widespread scattered hypermetabolic lytic osseous metastasesthroughout the axial and proximal appendicular skeleton. Bilateral proximal femoral skeletal metastases placed the patient at risk for pathologic hip fractures. There was mild hypermetabolic right hilar lymphadenopathy.  09/23/2017: Plain films of the bilateral femursrevealed no pathologic fracture. The LEFT view showed lytic subtrochanteric metastasis that was previously seen on PET scan.  06/01/2018: PET scandemonstrated marked improvementin the osseous metastatic disease. Previously demonstrated areas of abnormally high metabolic activity have resolved, with the exception of a very faint area of residual activity (SUV 2.6; previously 11.5) in the LEFT subtrochanteric region. Previous LEFT axillary fluid collection now shows a 1.8 x 1.2 cm scarlike density with low-grade activity (SUV 2.7). Incidental findings include a small hiatal hernia and diverticulosis of the sigmoid colon.  02/11/2018: Lumbar spine MRI revealed multifocal lumbosacral vertebral body signal abnormalities consistent with osseous metastatic disease. The largest lesion was at L1. There was no pathologic compression  fracture.  There was multi-level moderate to severe neural foraminal stenosis, which had progressed since the prior study. 09/26/2018: Sacral MRI revealed scattered small marrow lesionsc/wmetastatic disease.The0.6 cm lesion in the S1 vertebral bodywasnew since the comparison lumbar spine MRI. Small lesion in the left ilium adjacent to the SI joint was present on the prior MRI. The remaining lesions werenot included on the prior MRI. There was nofracture or other acute abnormality.There was lower lumbar spondylosisand diverticulosis. 10/15/2018: PET scanrevealed no evidence of active breast cancer metastasis on FDG PET scan. There were stable lytic lesions in the spine without evidence of metabolic activity consistent treated metastasis. 05/20/2019:PET scanrevealed no evidence of breast cancer recurrence or progression.There were stable lytic lesions in the spine without associated metabolic activity.CT datawasdegraded by patient motion. 09/16/2019:  Bone density revealed osteopenia with a T-score of -1.9 at the forearm radius.  11/19/2019:  Bone scan revealed no signs of uptake to suggest metastatic disease. Some areas of increased uptake in the cervical and lumbar spine were more compatible with degenerative changes based on comparison with previous CT evaluation.   Appointment on 07/27/2020  Component Date Value Ref Range Status  . Sodium 07/27/2020 136  135 - 145 mmol/L Final  . Potassium 07/27/2020 4.1  3.5 - 5.1 mmol/L Final  . Chloride 07/27/2020 99  98 - 111 mmol/L Final  . CO2 07/27/2020 28  22 - 32 mmol/L Final  . Glucose, Bld 07/27/2020 98  70 - 99 mg/dL Final   Glucose reference range applies only to samples taken after fasting for at least 8 hours.  . BUN 07/27/2020 23  8 - 23 mg/dL Final  . Creatinine, Ser 07/27/2020 0.96  0.44 - 1.00 mg/dL Final  . Calcium 07/27/2020 9.0  8.9 - 10.3 mg/dL Final  . Total Protein 07/27/2020 7.3  6.5 - 8.1 g/dL Final  . Albumin  07/27/2020 4.0  3.5 - 5.0 g/dL Final  . AST 07/27/2020 19  15 - 41 U/L Final  . ALT 07/27/2020 12  0 - 44 U/L Final  . Alkaline Phosphatase 07/27/2020 70  38 - 126 U/L Final  . Total Bilirubin 07/27/2020 0.5  0.3 - 1.2 mg/dL Final  . GFR calc non Af Amer 07/27/2020 51* >60 mL/min Final  . GFR calc Af Amer 07/27/2020 60* >60 mL/min Final  . Anion gap 07/27/2020 9  5 - 15 Final   Performed at [facility], [address], [place], [place] [address]  . WBC 07/27/2020 5.8  4.0 - 10.5 K/uL Final  . RBC 07/27/2020 4.29  3.87 - 5.11 MIL/uL Final  . Hemoglobin 07/27/2020 12.5  12.0 - 15.0 g/dL Final  . HCT 07/27/2020 37.6  36 - 46 % Final  . MCV 07/27/2020 87.6  80.0 - 100.0 fL Final  . MCH 07/27/2020 29.1  26.0 - 34.0 pg Final  . MCHC 07/27/2020 33.2  30.0 - 36.0 g/dL Final  . RDW 07/27/2020 14.8  11.5 - 15.5 % Final  . Platelets 07/27/2020 211  150 - 400 K/uL Final  . nRBC 07/27/2020 0.0  0.0 - 0.2 % Final  . Neutrophils Relative % 07/27/2020 65  % Final  . Neutro Abs 07/27/2020 3.8  1.7 - 7.7 K/uL Final  . Lymphocytes Relative 07/27/2020 21  % Final  . Lymphs Abs 07/27/2020 1.2  0.7 - 4.0 K/uL Final  . Monocytes Relative 07/27/2020 7  % Final  . Monocytes Absolute 07/27/2020 0.4  0 - 1 K/uL Final  . Eosinophils Relative  07/27/2020 6  % Final  . Eosinophils Absolute 07/27/2020 0.3  0 - 0 K/uL Final  . Basophils Relative 07/27/2020 1  % Final  . Basophils Absolute 07/27/2020 0.1  0 - 0 K/uL Final  . Immature Granulocytes 07/27/2020 0  % Final  . Abs Immature Granulocytes 07/27/2020 0.02  0.00 - 0.07 K/uL Final   Performed at [facility], [address] [place], [place] [address]    Assessment:  KAEYA SCHIFFER is a 86 y.o. female with metastatic breast cancer s/p left axillary biopsy on 07/10/2017. Pathology revealed a 1.1 cm grade II invasive mammary carcinoma with focal lobular features. Wide excisionon 08/20/2017 revealed metastatic mammary carcinoma  involving 1 of 4 axillary lymph nodes with extracapsular extension. The superior medial margin was involved. Tumor was ER + (>90%), PR + (>90%), and Her2/neu 2+ (FISH -).   [facility] genetic testingon 09/26/2017 revealed no pathogenic sequence variants or deletions/duplications. BRCA1/2 testing on 09/26/2017 was negative.  She has a history of left breast cancers/p wide excisionand sentinel lymph node biopsy on 07/18/1999 at [facility]. Pathology revealed a 0.8 x 0.6 x 0.4 cm grade II invasive ductal adenocarcinoma. There was no lymphovascular invasion. 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 Margins were negative. Tumor was ER + (50%) and PR + (90%). Pathologic stagewas T1bN0. She received 6200 cGyfrom 08/21/1999 - 10/03/1999. She completed 5 years of tamoxifenin 06/2004.   PET scanon 09/16/2017 revealed widespread scattered hypermetabolic lytic osseous metastases throughout the axial and proximal appendicular skeleton. Bilateral proximal femoral skeletal metastases placed the patient at risk for pathologic hip fractures. There was mild hypermetabolic right hilar lymphadenopathy. Plain films of the bilateral femurson 09/23/2017 revealed no pathologic fracture. The LEFT view showed lytic subtrochanteric metastasis that was previously seen on PET scan.   PET scan on 05/20/2019 revealedno evidence of breast cancer recurrence or progression.There were stable lytic lesions in the spine without associated metabolic activity.CT datawasdegraded by patient motion.  Bone scan on 11/19/2019 showed no signs of uptake to suggest metastatic disease. Some areas of increased uptake in the cervical and lumbar spine were more compatible with degenerative changes based on comparison with previous CT evaluation.  CA27.29has been followed: 14.7 on 02/05/2017, 19.1 on 02/06/2012, 172.2 on 09/04/2017, 144.9 on 11/06/2017, 73.4 on 02/05/2018, 63.3 on 03/06/2018, 47.2 on 04/06/2018, 42.4  on 05/07/2018, 36.6 on 06/08/2018, 34.6 on 07/06/2018, 27.1 on 08/06/2018, 28.2 on 09/10/2018, 26.0 on 10/09/2018, 30.9 on 11/09/2018, 27.7 on 12/07/2018, 35.8 on 01/04/2019, 24.8 on 02/01/2019, 24.2 on 04/26/2019, 21.6 on 05/24/2019, 24.5 on 07/19/2019, 20.6 on 10/12/2019, 18.5 on 11/10/2019, 20.0 on 02/02/2020, 20.1 on 04/26/2020, and 16.6 on 07/27/2020.  She received a palliative course of radiation(3000 cGy in 10 fractions) to the left hip from 10/14/2017 - 10/27/2017. She received a course of palliative radiationthe the lumbar spine and SI joints from 03/04/2018 - 03/27/2018.  She began Faslodexon 09/26/2017 (last 06/27/2020). She began monthly Xgevaon 09/26/2017 (last 05/07/2018). She has dental issues.  Bone densityon 09/01/2017 revealed osteopeniawith a T-score of -1.9 in the right femoral neck and left forearm. Bone density on 09/16/2019 revealed osteopenia with a T-score of -1.9 at the forearm radius.  She is on calcium and vitamin D.  She received both doses of the [place] COVID-19 vaccine in 03/2020.   Symptomatically, she feels ok. She has chronic back and knee pain. Her neuropathy and balance are about the same.  She denies any breast concerns.  Exam is stable.  Plan: 1.  Labs today: CBC with diff, CMP, CA 27.29. 2.Metastatic breast cancer Clinically, she continues to do well. Exam is stable.  CA 27.29 is normal. PET scan on06/10/2019 revealed no evidence of progression. Bone scan on 11/19/2019 reveled no evidence of active metastatic disease. Labs reviewed.  Faslodex today. Anticipate reimaging studies this year. 3.Bone metastasis Patient denies any new bone pain.   She has chronic back pain felt related to degenerative changes. Patient had no evidence of metastatic disease on PET scan from 05/20/2019.  There was no evidence of active disease on bone scan from 11/19/2019. He has been unable to receive Xgeva secondary to dental  issues. 4.Osteopenia Continue calcium and vitamin D. Patient to notify clinic if cleared for future Prolia. 5.Faslodex today. 6.   RN: call dentist about clearance. 7.   RTC monthly x2 for Faslodex 8.   RTC in 3 months for MD assessment, labs (CBC with diff CMP, CA 27.29) and Faslodex.  I discussed the assessment and treatment plan with the patient.  The patient was provided an opportunity to ask questions and all were answered.  The patient agreed with the plan and demonstrated an understanding of the instructions.  The patient was advised to call back if the symptoms worsen or if the condition fails to improve as anticipated.   Lequita Asal, MD, PhD    07/27/2020, 2:24 PM  I, Selena Batten, am acting as a scribe for Lequita Asal, MD.  I, [place] Mike Gip, MD, have reviewed the above documentation for accuracy and completeness, and I agree with the above.

## 2020-07-27 ENCOUNTER — Ambulatory Visit: Payer: Medicare Other | Admitting: Hematology and Oncology

## 2020-07-27 ENCOUNTER — Other Ambulatory Visit: Payer: Self-pay

## 2020-07-27 ENCOUNTER — Encounter: Payer: Self-pay | Admitting: Hematology and Oncology

## 2020-07-27 ENCOUNTER — Inpatient Hospital Stay (HOSPITAL_BASED_OUTPATIENT_CLINIC_OR_DEPARTMENT_OTHER): Payer: Medicare Other | Admitting: Hematology and Oncology

## 2020-07-27 ENCOUNTER — Other Ambulatory Visit: Payer: Medicare Other

## 2020-07-27 ENCOUNTER — Inpatient Hospital Stay: Payer: Medicare Other

## 2020-07-27 ENCOUNTER — Inpatient Hospital Stay: Payer: Medicare Other | Attending: Hematology and Oncology

## 2020-07-27 ENCOUNTER — Ambulatory Visit: Payer: Medicare Other

## 2020-07-27 VITALS — BP 132/86 | HR 56 | Temp 97.8°F | Resp 20 | Wt 196.0 lb

## 2020-07-27 DIAGNOSIS — C7951 Secondary malignant neoplasm of bone: Secondary | ICD-10-CM

## 2020-07-27 DIAGNOSIS — C50412 Malignant neoplasm of upper-outer quadrant of left female breast: Secondary | ICD-10-CM

## 2020-07-27 DIAGNOSIS — Z5111 Encounter for antineoplastic chemotherapy: Secondary | ICD-10-CM | POA: Insufficient documentation

## 2020-07-27 DIAGNOSIS — Z170 Estrogen receptor positive status [ER+]: Secondary | ICD-10-CM | POA: Diagnosis not present

## 2020-07-27 DIAGNOSIS — M85851 Other specified disorders of bone density and structure, right thigh: Secondary | ICD-10-CM

## 2020-07-27 LAB — COMPREHENSIVE METABOLIC PANEL
ALT: 12 U/L (ref 0–44)
AST: 19 U/L (ref 15–41)
Albumin: 4 g/dL (ref 3.5–5.0)
Alkaline Phosphatase: 70 U/L (ref 38–126)
Anion gap: 9 (ref 5–15)
BUN: 23 mg/dL (ref 8–23)
CO2: 28 mmol/L (ref 22–32)
Calcium: 9 mg/dL (ref 8.9–10.3)
Chloride: 99 mmol/L (ref 98–111)
Creatinine, Ser: 0.96 mg/dL (ref 0.44–1.00)
GFR calc Af Amer: 60 mL/min — ABNORMAL LOW (ref >60)
GFR calc non Af Amer: 51 mL/min — ABNORMAL LOW (ref >60)
Glucose, Bld: 98 mg/dL (ref 70–99)
Potassium: 4.1 mmol/L (ref 3.5–5.1)
Sodium: 136 mmol/L (ref 135–145)
Total Bilirubin: 0.5 mg/dL (ref 0.3–1.2)
Total Protein: 7.3 g/dL (ref 6.5–8.1)

## 2020-07-27 LAB — CBC WITH DIFFERENTIAL/PLATELET
Abs Immature Granulocytes: 0.02 10*3/uL (ref 0.00–0.07)
Basophils Absolute: 0.1 10*3/uL (ref 0.0–0.1)
Basophils Relative: 1 %
Eosinophils Absolute: 0.3 10*3/uL (ref 0.0–0.5)
Eosinophils Relative: 6 %
HCT: 37.6 % (ref 36.0–46.0)
Hemoglobin: 12.5 g/dL (ref 12.0–15.0)
Immature Granulocytes: 0 %
Lymphocytes Relative: 21 %
Lymphs Abs: 1.2 10*3/uL (ref 0.7–4.0)
MCH: 29.1 pg (ref 26.0–34.0)
MCHC: 33.2 g/dL (ref 30.0–36.0)
MCV: 87.6 fL (ref 80.0–100.0)
Monocytes Absolute: 0.4 10*3/uL (ref 0.1–1.0)
Monocytes Relative: 7 %
Neutro Abs: 3.8 10*3/uL (ref 1.7–7.7)
Neutrophils Relative %: 65 %
Platelets: 211 10*3/uL (ref 150–400)
RBC: 4.29 MIL/uL (ref 3.87–5.11)
RDW: 14.8 % (ref 11.5–15.5)
WBC: 5.8 10*3/uL (ref 4.0–10.5)
nRBC: 0 % (ref 0.0–0.2)

## 2020-07-27 MED ORDER — FULVESTRANT 250 MG/5ML IM SOLN
500.0000 mg | Freq: Once | INTRAMUSCULAR | Status: AC
  Administered 2020-07-27: 500 mg via INTRAMUSCULAR

## 2020-07-28 LAB — CANCER ANTIGEN 27.29: CA 27.29: 16.6 U/mL (ref 0.0–38.6)

## 2020-08-13 ENCOUNTER — Other Ambulatory Visit: Payer: Self-pay | Admitting: Internal Medicine

## 2020-08-13 DIAGNOSIS — I10 Essential (primary) hypertension: Secondary | ICD-10-CM

## 2020-08-28 ENCOUNTER — Inpatient Hospital Stay: Payer: Medicare Other

## 2020-08-28 ENCOUNTER — Inpatient Hospital Stay: Payer: Medicare Other | Attending: Hematology and Oncology

## 2020-08-28 ENCOUNTER — Other Ambulatory Visit: Payer: Self-pay

## 2020-08-28 DIAGNOSIS — C50412 Malignant neoplasm of upper-outer quadrant of left female breast: Secondary | ICD-10-CM | POA: Diagnosis not present

## 2020-08-28 DIAGNOSIS — C7951 Secondary malignant neoplasm of bone: Secondary | ICD-10-CM | POA: Diagnosis not present

## 2020-08-28 DIAGNOSIS — Z5111 Encounter for antineoplastic chemotherapy: Secondary | ICD-10-CM | POA: Insufficient documentation

## 2020-08-28 DIAGNOSIS — M85851 Other specified disorders of bone density and structure, right thigh: Secondary | ICD-10-CM

## 2020-08-28 MED ORDER — FULVESTRANT 250 MG/5ML IM SOLN
500.0000 mg | Freq: Once | INTRAMUSCULAR | Status: AC
  Administered 2020-08-28: 500 mg via INTRAMUSCULAR
  Filled 2020-08-28: qty 10

## 2020-08-29 DIAGNOSIS — H2511 Age-related nuclear cataract, right eye: Secondary | ICD-10-CM | POA: Diagnosis not present

## 2020-09-13 ENCOUNTER — Other Ambulatory Visit: Payer: Self-pay

## 2020-09-13 ENCOUNTER — Telehealth: Payer: Self-pay

## 2020-09-13 ENCOUNTER — Telehealth: Payer: Self-pay | Admitting: *Deleted

## 2020-09-13 DIAGNOSIS — Z23 Encounter for immunization: Secondary | ICD-10-CM

## 2020-09-13 DIAGNOSIS — Z299 Encounter for prophylactic measures, unspecified: Secondary | ICD-10-CM

## 2020-09-13 NOTE — Telephone Encounter (Signed)
This patient would like to get the Flu injection in the [facility] with her next appointment on 09/25/2020. Thanks

## 2020-09-13 NOTE — Telephone Encounter (Signed)
This patient would like to get the Flu injection in the [facility] with her next appointment on 09/25/2020. The patient was understanding and agreeable.

## 2020-09-13 NOTE — Telephone Encounter (Signed)
Did you add it to her appointment and order it?

## 2020-09-13 NOTE — Telephone Encounter (Signed)
Patient called reporting that she has an appointment next week and wants to know if we are giving flu shots and if so can she get one when she comes in Monday. Please return her call with answer

## 2020-09-25 ENCOUNTER — Other Ambulatory Visit: Payer: Self-pay

## 2020-09-25 ENCOUNTER — Inpatient Hospital Stay: Payer: Medicare Other

## 2020-09-25 ENCOUNTER — Inpatient Hospital Stay: Payer: Medicare Other | Attending: Hematology and Oncology

## 2020-09-25 DIAGNOSIS — Z5111 Encounter for antineoplastic chemotherapy: Secondary | ICD-10-CM | POA: Diagnosis not present

## 2020-09-25 DIAGNOSIS — M85851 Other specified disorders of bone density and structure, right thigh: Secondary | ICD-10-CM

## 2020-09-25 DIAGNOSIS — Z23 Encounter for immunization: Secondary | ICD-10-CM | POA: Diagnosis not present

## 2020-09-25 DIAGNOSIS — C50412 Malignant neoplasm of upper-outer quadrant of left female breast: Secondary | ICD-10-CM | POA: Insufficient documentation

## 2020-09-25 DIAGNOSIS — Z299 Encounter for prophylactic measures, unspecified: Secondary | ICD-10-CM

## 2020-09-25 DIAGNOSIS — C7951 Secondary malignant neoplasm of bone: Secondary | ICD-10-CM | POA: Insufficient documentation

## 2020-09-25 MED ORDER — FULVESTRANT 250 MG/5ML IM SOLN
500.0000 mg | Freq: Once | INTRAMUSCULAR | Status: AC
  Administered 2020-09-25: 500 mg via INTRAMUSCULAR
  Filled 2020-09-25: qty 10

## 2020-09-25 MED ORDER — INFLUENZA VAC A&B SA ADJ QUAD 0.5 ML IM PRSY
0.5000 mL | PREFILLED_SYRINGE | Freq: Once | INTRAMUSCULAR | Status: AC
  Administered 2020-09-25: 0.5 mL via INTRAMUSCULAR
  Filled 2020-09-25: qty 0.5

## 2020-10-18 ENCOUNTER — Ambulatory Visit (INDEPENDENT_AMBULATORY_CARE_PROVIDER_SITE_OTHER): Payer: Medicare Other

## 2020-10-18 DIAGNOSIS — Z0000 Encounter for general adult medical examination without abnormal findings: Secondary | ICD-10-CM

## 2020-10-18 NOTE — Progress Notes (Signed)
Subjective:   Kelli Williams is a 86 y.o. female who presents for Medicare Annual (Subsequent) preventive examination.  Virtual Visit via Telephone Note  I connected with  Kelli Williams on 10/18/20 at  2:40 PM EST by telephone and verified that I am speaking with the correct person using two identifiers.  Medicare Annual Wellness visit completed telephonically due to Covid-19 pandemic.   Location: Patient: home Provider: [facility]   I discussed the limitations, risks, security and privacy concerns of performing an evaluation and management service by telephone and the availability of in person appointments. The patient expressed understanding and agreed to proceed.  Unable to perform video visit due to video visit attempted and failed and/or patient does not have video capability.   Some vital signs may be absent or patient reported.   Clemetine Marker, LPN    Review of Systems           Objective:    Today's Vitals   10/18/20 1455  PainSc: 5    There is no height or weight on file to calculate BMI.  Advanced Directives 10/18/2020 07/27/2020 04/25/2020 11/10/2019 10/13/2019 10/12/2019 07/19/2019  Does Patient Have a Medical Advance Directive? Yes Yes Yes Yes Yes Yes Yes  Type of Paramedic of [place];Living will [place];Living will - [place];Living will - [place];Living will [place];Living will  Does patient want to make changes to medical advance directive? - Yes (MAU/Ambulatory/Procedural Areas - Information given) - - - - -  Copy of [place] in Chart? No - copy requested - - Yes - validated most recent copy scanned in chart (See row information) Yes - validated most recent copy scanned in chart (See row information) Yes - validated most recent copy scanned in chart (See row information) No - copy requested  Would patient like information on creating a  medical advance directive? - - - - - No - Patient declined -    Current Medications (verified) Outpatient Encounter Medications as of 10/18/2020  Medication Sig  . acetaminophen (TYLENOL) 325 MG tablet Take 650 mg by mouth every 6 (six) hours as needed.  Marland Kitchen aspirin 81 MG tablet Take 1 tablet by mouth daily.  . Calcium-Magnesium 500-250 MG TABS Take 1 tablet by mouth daily.   . Cholecalciferol (VITAMIN D3) 25 MCG (1000 UT) CAPS Take 1 capsule by mouth daily.   . clindamycin (CLEOCIN) 300 MG capsule Take 300 mg by mouth once. Prior to dental appointment  . Coenzyme Q10 (CO Q-10) 100 MG CAPS Take 1 tablet by mouth daily.   Marland Kitchen ezetimibe (ZETIA) 10 MG tablet TAKE 1 TABLET BY MOUTH  DAILY  . gabapentin (NEURONTIN) 300 MG capsule TAKE 3 CAPSULES BY MOUTH  TWICE DAILY  . lisinopril-hydrochlorothiazide (ZESTORETIC) 20-25 MG tablet TAKE 1 AND 1/2 TABLETS BY  MOUTH DAILY  . meloxicam (MOBIC) 15 MG tablet TAKE 1 TABLET BY MOUTH  DAILY  . Multiple Vitamins-Minerals (MULTIVITAMIN WITH MINERALS) tablet Take 1 tablet by mouth daily.  Marland Kitchen omeprazole (PRILOSEC) 20 MG capsule TAKE 1 CAPSULE BY MOUTH  DAILY  . Sodium Fluoride (CLINPRO 5000) 1.1 % PSTE Place 1 application onto teeth 2 (two) times daily.  . traMADol (ULTRAM) 50 MG tablet TAKE 1/2 TO 1 TABLET BY  MOUTH EVERY 8 HOURS AS  NEEDED FOR PAIN   No facility-administered encounter medications on file as of 10/18/2020.    Allergies (verified) Amitriptyline, Amoxicillin, Black  pepper [piper], Ivp dye [iodinated diagnostic agents], Tape, Sulfa antibiotics, and Sulfa antibiotics   History: Past Medical History:  Diagnosis Date  . Anxiety   . Arthritis    BOTH FEET AND KNEES  . Breast cancer ([place]) 2000   left breast ca with lumpectomy and rad tx 5 yr tamoxifen/Dr Jeannine Kitten at [facility]  . Complication of anesthesia    WOKE UP DURING SURGERY FOR DEVIATED SEPTUM, KNEE REPLACEMENT AND BREAST LUMPECTOMY  . Dysrhythmia   . GERD (gastroesophageal reflux disease)   .  Hypertension   . Neuropathic pain of both legs   . Neuropathy   . Personal history of chemotherapy   . Personal history of radiation therapy   . Pneumonia 2016  . Skin cancer of forehead   . Skin cancer of nose    Past Surgical History:  Procedure Laterality Date  . ABDOMINAL HYSTERECTOMY    . AXILLARY LYMPH NODE BIOPSY Left 07/10/2017   INVASIVE MAMMARY CARCINOMA WITH FOCAL LOBULAR FEATURES.   Marland Kitchen BREAST BIOPSY Bilateral 1960   benign  . BREAST LUMPECTOMY Left 2000   breast ca with rad tx  . BREAST LUMPECTOMY Left 08/20/2017  . BREAST LUMPECTOMY Left 08/20/2017   Procedure: left breast wide excision;  Surgeon: Robert Bellow, MD;  Location: [facility] ORS;  Service: General;  Laterality: Left;  . CATARACT EXTRACTION     left eye  . CHOLECYSTECTOMY    . JOINT REPLACEMENT    . NASAL SEPTUM SURGERY    . PARTIAL KNEE ARTHROPLASTY     right  . TOTAL VAGINAL HYSTERECTOMY     Family History  Problem Relation Age of Onset  . Heart disease Mother   . Heart attack Paternal Uncle   . Breast cancer Daughter 26       genetic negative  . Cancer Paternal Grandmother    Social History   Socioeconomic History  . Marital status: Widowed    Spouse name: Not on file  . Number of children: 3  . Years of education: some college  . Highest education level: 12th grade  Occupational History  . Occupation: Retired  Tobacco Use  . Smoking status: Never Smoker  . Smokeless tobacco: Never Used  . Tobacco comment: smoking cessation materials not required  Vaping Use  . Vaping Use: Never used  Substance and Sexual Activity  . Alcohol use: No    Alcohol/week: 0.0 standard drinks  . Drug use: No  . Sexual activity: Not Currently  Other Topics Concern  . Not on file  Social History Narrative   Patient lives in independent living at [facility] Strain: Low Risk   . Difficulty of Paying Living Expenses: Not hard at all  Food  Insecurity: No Food Insecurity  . Worried About Charity fundraiser in the Last Year: Never true  . Ran Out of Food in the Last Year: Never true  Transportation Needs:   . Lack of Transportation (Medical): Not on file  . Lack of Transportation (Non-Medical): Not on file  Physical Activity: Inactive  . Days of Exercise per Week: 0 days  . Minutes of Exercise per Session: 0 min  Stress: No Stress Concern Present  . Feeling of Stress : Not at all  Social Connections: Socially Isolated  . Frequency of Communication with Friends and Family: More than three times a week  . Frequency of Social Gatherings with Friends and Family: Three  times a week  . Attends Religious Services: Never  . Active Member of Clubs or Organizations: No  . Attends Archivist Meetings: Never  . Marital Status: Widowed    Tobacco Counseling Counseling given: Not Answered Comment: smoking cessation materials not required   Clinical Intake:  Pre-visit preparation completed: Yes  Pain : 0-10 Pain Score: 5  Pain Type: Chronic pain Pain Location: Back Pain Orientation: Lower Pain Descriptors / Indicators: Aching, Sore Pain Onset: More than a month ago Pain Frequency: Constant     Nutritional Risks: None Diabetes: No  How often do you need to have someone help you when you read instructions, pamphlets, or other written materials from your doctor or pharmacy?: 1 - Never    Interpreter Needed?: No  Information entered by :: Clemetine Marker LPN   Activities of Daily Living No flowsheet data found.  Patient Care Team: Glean Hess, MD as PCP - General (Internal Medicine) [name], MD as Consulting Physician (Cardiology) Renata Caprice as Physician Assistant (Orthopedic Surgery) Bary Castilla Forest Gleason, MD (General Surgery) Hessie Knows, MD as Consulting Physician (Orthopedic Surgery) Lequita Asal, MD as Consulting Physician (Hematology and Oncology)  Indicate any  recent Medical Services you may have received from other than [facility] providers in the past year (date may be approximate).     Assessment:   This is a routine wellness examination for Kelli Williams.  Hearing/Vision screen  Hearing Screening   125Hz  250Hz  500Hz  1000Hz  2000Hz  3000Hz  4000Hz  6000Hz  8000Hz   Right ear:           Left ear:           Comments: Pt denies hearing difficulty  Vision Screening Comments: Annual vision screenings done at [facility] Dr. Thomasene Ripple  Dietary issues and exercise activities discussed:    Goals    . DIET - INCREASE WATER INTAKE     Recommend to drink at least 6-8 8oz glasses of water per day.      Depression Screen PHQ 2/9 Scores 10/18/2020 06/21/2020 12/23/2019 10/13/2019 06/21/2019 12/16/2018 06/29/2018  PHQ - 2 Score 0 0 0 0 0 0 0  PHQ- 9 Score - 0 2 - 0 - 0    Fall Risk Fall Risk  06/21/2020 10/13/2019 06/21/2019 12/16/2018 06/29/2018  Falls in the past year? 0 0 0 1 Yes  Comment - - - - tripping and loss of balance  Number falls in past yr: 0 0 1 1 2  or more  Injury with Fall? 0 0 0 0 No  Risk Factor Category  - - - - High Fall Risk  Risk for fall due to : No Fall Risks Impaired balance/gait;Impaired mobility History of fall(s);Impaired balance/gait;Impaired mobility;Impaired vision History of fall(s);Impaired balance/gait;Impaired mobility;Medication side effect Impaired vision;Impaired balance/gait;Medication side effect;History of fall(s)  Risk for fall due to: Comment - - - - wears eyeglasses; ambulates with rolling walker, joint pain  Follow up Falls evaluation completed Falls prevention discussed Falls evaluation completed;Falls prevention discussed Falls evaluation completed Falls evaluation completed;Education provided;Falls prevention discussed    Any stairs in or around the home? Yes  If so, are there any without handrails? No  Home free of loose throw rugs in walkways, pet beds, electrical cords, etc? Yes  Adequate lighting in your home  to reduce risk of falls? Yes   ASSISTIVE DEVICES UTILIZED TO PREVENT FALLS:  Life alert? Yes  Use of a cane, walker or w/c? Yes  Grab bars in the bathroom?  Yes  Shower chair or bench in shower? Yes  Elevated toilet seat or a handicapped toilet? No   TIMED UP AND GO:  Was the test performed? No . Telephonic visit.   Cognitive Function: Normal cognitive status assessed by direct observation by this Nurse Health Advisor.       6CIT Screen 10/13/2019 06/29/2018 06/27/2017  What Year? 0 points 0 points 0 points  What month? 0 points 0 points 0 points  What time? 0 points 0 points 0 points  Count back from 20 0 points 0 points 0 points  Months in reverse 0 points 0 points 0 points  Repeat phrase 0 points 0 points 2 points  Total Score 0 0 2    Immunizations Immunization History  Administered Date(s) Administered  . Fluad Quad(high Dose 65+) 10/12/2019, 09/25/2020  . Influenza, High Dose Seasonal PF 09/23/2017, 09/10/2018  . Influenza,inj,Quad PF,6+ Mos 10/16/2016  . Influenza-Unspecified 09/02/2015  . PFIZER SARS-COV-2 Vaccination 12/16/2019, 01/10/2020  . Pneumococcal Conjugate-13 09/08/2014  . Pneumococcal Polysaccharide-23 09/14/1994, 04/30/1997  . Pneumococcal-Unspecified 09/14/1994  . Tdap 06/09/2011  . Zoster 05/12/2006    TDAP status: Up to date   Flu Vaccine status: Up to date   Pneumococcal vaccine status: Up to date   Covid-19 vaccine status: Completed vaccines  Qualifies for Shingles Vaccine? Yes   Zostavax completed Yes   Shingrix Completed?: No.    Education has been provided regarding the importance of this vaccine. Patient has been advised to call insurance company to determine out of pocket expense if they have not yet received this vaccine. Advised may also receive vaccine at local pharmacy or Health Dept. Verbalized acceptance and understanding.  Screening Tests Health Maintenance  Topic Date Due  . TETANUS/TDAP  06/08/2021  . INFLUENZA VACCINE   Completed  . DEXA SCAN  Completed  . COVID-19 Vaccine  Completed  . PNA vac Low Risk Adult  Completed    Health Maintenance  There are no preventive care reminders to display for this patient.  Colorectal cancer screening: No longer required.    Mammogram status: No longer required.    Bone Density status: Completed 09/16/19. Results reflect: Bone density results: OSTEOPENIA. Repeat every 2 years.  Lung Cancer Screening: (Low Dose CT Chest recommended if Age 31-80 years, 30 pack-year currently smoking OR have quit w/in 15years.) does not qualify.   Additional Screening:  Hepatitis C Screening: does not qualify;  Vision Screening: Recommended annual ophthalmology exams for early detection of glaucoma and other disorders of the eye. Is the patient up to date with their annual eye exam?  Yes  Who is the provider or what is the name of the office in which the patient attends annual eye exams? [place] Screening: Recommended annual dental exams for proper oral hygiene  Community Resource Referral / Chronic Care Management: CRR required this visit?  No   CCM required this visit?  No      Plan:     I have personally reviewed and noted the following in the patient's chart:   . Medical and social history . Use of alcohol, tobacco or illicit drugs  . Current medications and supplements . Functional ability and status . Nutritional status . Physical activity . Advanced directives . List of other physicians . Hospitalizations, surgeries, and ER visits in previous 12 months . Vitals . Screenings to include cognitive, depression, and falls . Referrals and appointments  In addition, I have reviewed and discussed with patient certain  preventive protocols, quality metrics, and best practice recommendations. A written personalized care plan for preventive services as well as general preventive health recommendations were provided to patient.     Clemetine Marker,  LPN   [id number]   Nurse Notes: none

## 2020-10-18 NOTE — Patient Instructions (Signed)
Kelli Williams , Thank you for taking time to come for your Medicare Wellness Visit. I appreciate your ongoing commitment to your health goals. Please review the following plan we discussed and let me know if I can assist you in the future.   Screening recommendations/referrals: Colonoscopy: no longer required Mammogram: no longer required Bone Density: done 09/16/19 Recommended yearly ophthalmology/optometry visit for glaucoma screening and checkup Recommended yearly dental visit for hygiene and checkup  Vaccinations: Influenza vaccine: done 09/25/20 Pneumococcal vaccine: done 09/08/14 Tdap vaccine: done 06/09/11 Shingles vaccine: Shingrix discussed. Please contact your pharmacy for coverage information.  Covid-19: done 12/16/19 7 01/10/20  Advanced directives: Please bring a copy of your health care power of attorney and living will to the office at your convenience.  Conditions/risks identified: Recommend continuing to prevent falls in the home  Next appointment: Follow up in one year for your annual wellness visit    Preventive Care 65 Years and Older, Female Preventive care refers to lifestyle choices and visits with your health care provider that can promote health and wellness. What does preventive care include?  A yearly physical exam. This is also called an annual well check.  Dental exams once or twice a year.  Routine eye exams. Ask your health care provider how often you should have your eyes checked.  Personal lifestyle choices, including:  Daily care of your teeth and gums.  Regular physical activity.  Eating a healthy diet.  Avoiding tobacco and drug use.  Limiting alcohol use.  Practicing safe sex.  Taking low-dose aspirin every day.  Taking vitamin and mineral supplements as recommended by your health care provider. What happens during an annual well check? The services and screenings done by your health care provider during your annual well check will depend  on your age, overall health, lifestyle risk factors, and family history of disease. Counseling  Your health care provider may ask you questions about your:  Alcohol use.  Tobacco use.  Drug use.  Emotional well-being.  Home and relationship well-being.  Sexual activity.  Eating habits.  History of falls.  Memory and ability to understand (cognition).  Work and work Statistician.  Reproductive health. Screening  You may have the following tests or measurements:  Height, weight, and BMI.  Blood pressure.  Lipid and cholesterol levels. These may be checked every 5 years, or more frequently if you are over 69 years old.  Skin check.  Lung cancer screening. You may have this screening every year starting at age 56 if you have a 30-pack-year history of smoking and currently smoke or have quit within the past 15 years.  Fecal occult blood test (FOBT) of the stool. You may have this test every year starting at age 61.  Flexible sigmoidoscopy or colonoscopy. You may have a sigmoidoscopy every 5 years or a colonoscopy every 10 years starting at age 48.  Hepatitis C blood test.  Hepatitis B blood test.  Sexually transmitted disease (STD) testing.  Diabetes screening. This is done by checking your blood sugar (glucose) after you have not eaten for a while (fasting). You may have this done every 1-3 years.  Bone density scan. This is done to screen for osteoporosis. You may have this done starting at age 53.  Mammogram. This may be done every 1-2 years. Talk to your health care provider about how often you should have regular mammograms. Talk with your health care provider about your test results, treatment options, and if necessary, the need for more  tests. Vaccines  Your health care provider may recommend certain vaccines, such as:  Influenza vaccine. This is recommended every year.  Tetanus, diphtheria, and acellular pertussis (Tdap, Td) vaccine. You may need a Td  booster every 10 years.  Zoster vaccine. You may need this after age 84.  Pneumococcal 13-valent conjugate (PCV13) vaccine. One dose is recommended after age 47.  Pneumococcal polysaccharide (PPSV23) vaccine. One dose is recommended after age 72. Talk to your health care provider about which screenings and vaccines you need and how often you need them. This information is not intended to replace advice given to you by your health care provider. Make sure you discuss any questions you have with your health care provider. Document Released: 12/22/2015 Document Revised: 08/14/2016 Document Reviewed: 09/26/2015 Elsevier Interactive Patient Education  2017 [place] Prevention in the Home Falls can cause injuries. They can happen to people of all ages. There are many things you can do to make your home safe and to help prevent falls. What can I do on the outside of my home?  Regularly fix the edges of walkways and driveways and fix any cracks.  Remove anything that might make you trip as you walk through a door, such as a raised step or threshold.  Trim any bushes or trees on the path to your home.  Use bright outdoor lighting.  Clear any walking paths of anything that might make someone trip, such as rocks or tools.  Regularly check to see if handrails are loose or broken. Make sure that both sides of any steps have handrails.  Any raised decks and porches should have guardrails on the edges.  Have any leaves, snow, or ice cleared regularly.  Use sand or salt on walking paths during winter.  Clean up any spills in your garage right away. This includes oil or grease spills. What can I do in the bathroom?  Use night lights.  Install grab bars by the toilet and in the tub and shower. Do not use towel bars as grab bars.  Use non-skid mats or decals in the tub or shower.  If you need to sit down in the shower, use a plastic, non-slip stool.  Keep the floor dry. Clean up  any water that spills on the floor as soon as it happens.  Remove soap buildup in the tub or shower regularly.  Attach bath mats securely with double-sided non-slip rug tape.  Do not have throw rugs and other things on the floor that can make you trip. What can I do in the bedroom?  Use night lights.  Make sure that you have a light by your bed that is easy to reach.  Do not use any sheets or blankets that are too big for your bed. They should not hang down onto the floor.  Have a firm chair that has side arms. You can use this for support while you get dressed.  Do not have throw rugs and other things on the floor that can make you trip. What can I do in the kitchen?  Clean up any spills right away.  Avoid walking on wet floors.  Keep items that you use a lot in easy-to-reach places.  If you need to reach something above you, use a strong step stool that has a grab bar.  Keep electrical cords out of the way.  Do not use floor polish or wax that makes floors slippery. If you must use wax, use non-skid floor  wax.  Do not have throw rugs and other things on the floor that can make you trip. What can I do with my stairs?  Do not leave any items on the stairs.  Make sure that there are handrails on both sides of the stairs and use them. Fix handrails that are broken or loose. Make sure that handrails are as long as the stairways.  Check any carpeting to make sure that it is firmly attached to the stairs. Fix any carpet that is loose or worn.  Avoid having throw rugs at the top or bottom of the stairs. If you do have throw rugs, attach them to the floor with carpet tape.  Make sure that you have a light switch at the top of the stairs and the bottom of the stairs. If you do not have them, ask someone to add them for you. What else can I do to help prevent falls?  Wear shoes that:  Do not have high heels.  Have rubber bottoms.  Are comfortable and fit you well.  Are  closed at the toe. Do not wear sandals.  If you use a stepladder:  Make sure that it is fully opened. Do not climb a closed stepladder.  Make sure that both sides of the stepladder are locked into place.  Ask someone to hold it for you, if possible.  Clearly mark and make sure that you can see:  Any grab bars or handrails.  First and last steps.  Where the edge of each step is.  Use tools that help you move around (mobility aids) if they are needed. These include:  Canes.  Walkers.  Scooters.  Crutches.  Turn on the lights when you go into a dark area. Replace any light bulbs as soon as they burn out.  Set up your furniture so you have a clear path. Avoid moving your furniture around.  If any of your floors are uneven, fix them.  If there are any pets around you, be aware of where they are.  Review your medicines with your doctor. Some medicines can make you feel dizzy. This can increase your chance of falling. Ask your doctor what other things that you can do to help prevent falls. This information is not intended to replace advice given to you by your health care provider. Make sure you discuss any questions you have with your health care provider. Document Released: 09/21/2009 Document Revised: 05/02/2016 Document Reviewed: 12/30/2014 Elsevier Interactive Patient Education  2017 [employer].

## 2020-10-23 ENCOUNTER — Encounter: Payer: Self-pay | Admitting: Cardiovascular Disease

## 2020-10-23 ENCOUNTER — Other Ambulatory Visit: Payer: Self-pay

## 2020-10-23 ENCOUNTER — Ambulatory Visit (INDEPENDENT_AMBULATORY_CARE_PROVIDER_SITE_OTHER): Payer: Medicare Other | Admitting: Cardiovascular Disease

## 2020-10-23 VITALS — BP 142/90 | HR 59 | Ht 64.0 in | Wt 197.0 lb

## 2020-10-23 DIAGNOSIS — I739 Peripheral vascular disease, unspecified: Secondary | ICD-10-CM | POA: Diagnosis not present

## 2020-10-23 DIAGNOSIS — I700 Atherosclerosis of aorta: Secondary | ICD-10-CM | POA: Diagnosis not present

## 2020-10-23 DIAGNOSIS — I2510 Atherosclerotic heart disease of native coronary artery without angina pectoris: Secondary | ICD-10-CM

## 2020-10-23 NOTE — Patient Instructions (Signed)
Lipid panel today  Medication Instructions:  No changes  If you need a refill on your cardiac medications before your next appointment, please call your pharmacy.    Lab work: As above   If you have labs (blood work) drawn today and your tests are completely normal, you will receive your results only by: Marland Kitchen MyChart Message (if you have MyChart) OR . A paper copy in the mail If you have any lab test that is abnormal or we need to change your treatment, we will call you to review the results.   Testing/Procedures: No new testing needed   Follow-Up: At [facility], you and your health needs are our priority.  As part of our continuing mission to provide you with exceptional heart care, we have created designated Provider Care Teams.  These Care Teams include your primary Cardiologist (physician) and Advanced Practice Providers (APPs -  Physician Assistants and Nurse Practitioners) who all work together to provide you with the care you need, when you need it.  . You will need a follow up appointment in 12 months  . Providers on your designated Care Team:   . Murray Hodgkins, NP . Christell Faith, PA-C . Marrianne Mood, PA-C  Any Other Special Instructions Will Be Listed Below (If Applicable).  COVID-19 Vaccine Information can be found at: [email or web address].uk For questions related to vaccine distribution or appointments, please email [email or web address] or call [phone].

## 2020-10-23 NOTE — Progress Notes (Signed)
Cardiology Office Note  Date:  10/23/2020   ID:  Kelli Williams, DOB 19-Mar-1926, MRN [id number]  PCP:  Kelli Hess, MD   Chief Complaint  Patient presents with  . Annual Exam    Pt states no new Sx.    HPI:  [name] is a 86 year old woman  with no known coronary artery disease or cardiac issues  Status post radiation treatment for breast cancer in 2000 on the left with 7 weeks of radiation on a daily basis.  She lost her husband in March 2016. Still with significant adjustment disorder/depression last echo 2011: normal EF 60%, who presents for routine followup of her tachycardia/palpitations, breast cancer  Followed by oncology for cancer Has f/u next week  [place] at the [place] of [place] Does not like it there, does not like the food Lives in an apartment No regular exercise program, walks with a walker  At home rate low 50s, chronic issue, no sx Denies any orthostasis symptoms  pet scan 11/2019 Results reviewed, no metastatic disease at that time  No recent lipid panel  Leg swelling, chronic stable Wonders what she can do  Denies any chest pain or shortness of breath on exertion  EKG personally reviewed by myself on todays visit Shows normal sinus rhythm rate 59 bpm poor R wave progression  Other past medical hx reviewed  lymph node under left arm resected, tested positive for breast cancer 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 Moderate cardiomegaly.  at least mild to moderate diffuse aortic atherosclerosis extending up to the carotids, some degree of coronary calcification/difficult to determine secondary to motion artifact  On denosumab for cancer   PMH:   has a past medical history of Anxiety,  Arthritis, Breast cancer ([place]) (4742), Complication of anesthesia, Dysrhythmia, GERD (gastroesophageal reflux disease), Hypertension, Neuropathic pain of both legs, Neuropathy, Personal history of chemotherapy, Personal history of radiation therapy, Pneumonia (2016), Skin cancer of forehead, and Skin cancer of nose.  PSH:    Past Surgical History:  Procedure Laterality Date  . ABDOMINAL HYSTERECTOMY    . AXILLARY LYMPH NODE BIOPSY Left 07/10/2017   INVASIVE MAMMARY CARCINOMA WITH FOCAL LOBULAR FEATURES.   Marland Kitchen BREAST BIOPSY Bilateral 1960   benign  . BREAST LUMPECTOMY Left 2000   breast ca with rad tx  . BREAST LUMPECTOMY Left 08/20/2017  . BREAST LUMPECTOMY Left 08/20/2017   Procedure: left breast wide excision;  Surgeon: Robert Bellow, MD;  Location: [facility] ORS;  Service: General;  Laterality: Left;  . CATARACT EXTRACTION     left eye  . CHOLECYSTECTOMY    . JOINT REPLACEMENT    . NASAL SEPTUM SURGERY    . PARTIAL KNEE ARTHROPLASTY     right  . TOTAL VAGINAL HYSTERECTOMY      Current Outpatient Medications  Medication Sig Dispense Refill  . acetaminophen (TYLENOL) 325 MG tablet Take 650 mg by mouth every 6 (six) hours as needed.    Marland Kitchen aspirin 81 MG tablet Take 1 tablet by mouth daily.    . Calcium-Magnesium 500-250 MG TABS Take 1 tablet by mouth daily.     . Cholecalciferol (VITAMIN D3) 25 MCG (1000 UT) CAPS Take 1 capsule by mouth daily.     . clindamycin (CLEOCIN) 300 MG capsule Take 300  mg by mouth once. Prior to dental appointment    . Coenzyme Q10 (CO Q-10) 100 MG CAPS Take 1 tablet by mouth daily.     Marland Kitchen ezetimibe (ZETIA) 10 MG tablet TAKE 1 TABLET BY MOUTH  DAILY 90 tablet 3  . gabapentin (NEURONTIN) 300 MG capsule TAKE 3 CAPSULES BY MOUTH  TWICE DAILY 540 capsule 3  . lisinopril-hydrochlorothiazide (ZESTORETIC) 20-25 MG tablet TAKE 1 AND 1/2 TABLETS BY  MOUTH DAILY 135 tablet 1  . meloxicam (MOBIC) 15 MG tablet TAKE 1 TABLET BY MOUTH  DAILY 90 tablet 3  . Multiple  Vitamins-Minerals (MULTIVITAMIN WITH MINERALS) tablet Take 1 tablet by mouth daily.    Marland Kitchen omeprazole (PRILOSEC) 20 MG capsule TAKE 1 CAPSULE BY MOUTH  DAILY 90 capsule 3  . Sodium Fluoride (CLINPRO 5000) 1.1 % PSTE Place 1 application onto teeth 2 (two) times daily. 112 g 0  . traMADol (ULTRAM) 50 MG tablet TAKE 1/2 TO 1 TABLET BY  MOUTH EVERY 8 HOURS AS  NEEDED FOR PAIN 90 tablet 0   No current facility-administered medications for this visit.     Allergies:   Amitriptyline, Amoxicillin, Black pepper [piper], Ivp dye [iodinated diagnostic agents], Tape, Sulfa antibiotics, and Sulfa antibiotics   Social History:  The patient  reports that she has never smoked. She has never used smokeless tobacco. She reports that she does not drink alcohol and does not use drugs.   Family History:   family history includes Breast cancer (age of onset: 78) in her daughter; Cancer in her paternal grandmother; Heart attack in her paternal uncle; Heart disease in her mother.    Review of Systems: Review of Systems  HENT: Negative.   Respiratory: Negative.   Cardiovascular: Positive for leg swelling.  Gastrointestinal: Negative.   Musculoskeletal: Positive for joint pain.       Difficulty walking  Neurological: Negative.   Psychiatric/Behavioral: Negative.   All other systems reviewed and are negative.   PHYSICAL EXAM: VS:  BP (!) 142/90   Pulse (!) 59   Ht 5\' 4"  (1.626 m)   Wt 197 lb (89.4 kg)   BMI 33.81 kg/m  , BMI Body mass index is 33.81 kg/m. Constitutional:  oriented to person, place, and time. No distress.  HENT:  Head: Grossly normal Eyes:  no discharge. No scleral icterus.  Neck: No JVD, no carotid bruits  Cardiovascular: Regular rate and rhythm, no murmurs appreciated Trace pitting le edema Pulmonary/Chest: Clear to auscultation bilaterally, no wheezes or rails Abdominal: Soft.  no distension.  no tenderness.  Musculoskeletal: Normal range of motion Neurological:  normal muscle  tone. Coordination normal. No atrophy Skin: Skin warm and dry Psychiatric: normal affect, pleasant  Recent Labs: 07/27/2020: ALT 12; BUN 23; Creatinine, Ser 0.96; Hemoglobin 12.5; Platelets 211; Potassium 4.1; Sodium 136    Lipid Panel Lab Results  Component Value Date   CHOL 154 09/07/2019   HDL 57 09/07/2019   LDLCALC 82 09/07/2019   TRIG 78 09/07/2019    Wt Readings from Last 3 Encounters:  10/23/20 197 lb (89.4 kg)  07/27/20 195 lb 15.8 oz (88.9 kg)  06/21/20 197 lb (89.4 kg)      ASSESSMENT AND PLAN:  Essential hypertension - Plan: EKG 12-Lead Blood pressure is well controlled on today's visit. No changes made to the medications.  Atrial tachycardia ([place]) - Plan: EKG 12-Lead No sx, off b-blocker  Adjustment disorder with other symptom - Plan: EKG 12-Lead loss of her husband [place] the  [place]  Leg swelling - Plan: EKG 12-Lead Lymphedema, unable to place TED hose suggest leg elevation  Breast cancer On chemotherapy medication has completed XRT Improvement in her metastases to bone Recent pet scan reviewed  Aortic atherosclerosis Mild-to-moderate in nature Continue  Zetia 10 mg daily cholestol at goal  Coronary calcification Seen on CT scan Also aortic athero LDL at goal 11/2018 Order placed for repeat lipid panel today   Total encounter time more than 25 minutes  Greater than 50% was spent in counseling and coordination of care with the patient    No orders of the defined types were placed in this encounter.    Signed, Esmond Plants, M.D., Ph.D. 10/23/2020  [place], [place]

## 2020-10-24 LAB — LIPID PANEL
Chol/HDL Ratio: 2.7 ratio (ref 0.0–4.4)
Cholesterol, Total: 164 mg/dL (ref 100–199)
HDL: 60 mg/dL (ref >39)
LDL Chol Calc (NIH): 80 mg/dL (ref 0–99)
Triglycerides: 142 mg/dL (ref 0–149)
VLDL Cholesterol Cal: 24 mg/dL (ref 5–40)

## 2020-10-26 NOTE — Progress Notes (Signed)
[facility]  [address], [address] [place], [place] [address] Phone: [phone]  Fax: [phone]   Clinic Day:  10/30/2020  Referring physician: Glean Hess, MD  Chief Complaint: Kelli Williams is a 86 y.o. female with metastatic breast cancer who is seen for a 3 month assessment and continuation of monthly Faslodex.    HPI: The patient was last seen in the medical onoclogy clinic on 07/27/2020. At that time, she felt "ok". She had chronic back and knee pain. Her neuropathy and balance were about the same.  She denied any breast concerns.  Exam was stable. Hematocrit was 37.6, hemoglobin 12.5, platelets 211,000, WBC 5,800. Creatinine was 0.96 (CrCl 51 ml/min). CA27.29 was 16.6. She continued calcium and vitamin D. She received Faslodex.  She received Faslodex on 08/28/2020 and 09/25/2020.    During the interim, she has been fine. Her back pain used to only be in one spot but has spread to her entire lower back. She also has pain in her hips and knees. Her legs sometimes buckle when she is walking.  She has a chronic neuropathy.  She always uses a rolling walker.   Past Medical History:  Diagnosis Date  . Anxiety   . Arthritis    BOTH FEET AND KNEES  . Breast cancer ([place]) 2000   left breast ca with lumpectomy and rad tx 5 yr tamoxifen/Dr Jeannine Kitten at [facility]  . Complication of anesthesia    WOKE UP DURING SURGERY FOR DEVIATED SEPTUM, KNEE REPLACEMENT AND BREAST LUMPECTOMY  . Dysrhythmia   . GERD (gastroesophageal reflux disease)   . Hypertension   . Neuropathic pain of both legs   . Neuropathy   . Personal history of chemotherapy   . Personal history of radiation therapy   . Pneumonia 2016  . Skin cancer of forehead   . Skin cancer of nose     Past Surgical History:  Procedure Laterality Date  . ABDOMINAL HYSTERECTOMY    . AXILLARY LYMPH NODE BIOPSY Left 07/10/2017   INVASIVE MAMMARY CARCINOMA WITH FOCAL LOBULAR FEATURES.   Marland Kitchen BREAST BIOPSY  Bilateral 1960   benign  . BREAST LUMPECTOMY Left 2000   breast ca with rad tx  . BREAST LUMPECTOMY Left 08/20/2017  . BREAST LUMPECTOMY Left 08/20/2017   Procedure: left breast wide excision;  Surgeon: Robert Bellow, MD;  Location: [facility] ORS;  Service: General;  Laterality: Left;  . CATARACT EXTRACTION     left eye  . CHOLECYSTECTOMY    . JOINT REPLACEMENT    . NASAL SEPTUM SURGERY    . PARTIAL KNEE ARTHROPLASTY     right  . TOTAL VAGINAL HYSTERECTOMY      Family History  Problem Relation Age of Onset  . Heart disease Mother   . Heart attack Paternal Uncle   . Breast cancer Daughter 65       genetic negative  . Cancer Paternal Grandmother     Social History:  reports that she has never smoked. She has never used smokeless tobacco. She reports that she does not drink alcohol and does not use drugs. The patient's husband died 2 years ago.She was born outside of [place].She previously worked as an Web designer. She lives at the [facility] [place]. She is independent except for the use of her walker. Patient has a daughter, Lakyra Tippins. The patient is alone today.   Allergies:  Allergies  Allergen Reactions  . Amitriptyline Hives  . Amoxicillin  Diarrhea    . Black Pepper [[place]]   . Ivp Dye [Iodinated Diagnostic Agents]   . Tape Other (See Comments)    [place] - please use paper tape.  . Sulfa Antibiotics Rash  . Sulfa Antibiotics Rash    Current Medications: Current Outpatient Medications  Medication Sig Dispense Refill  . acetaminophen (TYLENOL) 325 MG tablet Take 650 mg by mouth every 6 (six) hours as needed.    Marland Kitchen aspirin 81 MG tablet Take 1 tablet by mouth daily.    . Calcium-Magnesium 500-250 MG TABS Take 1 tablet by mouth daily.     . Cholecalciferol (VITAMIN D3) 25 MCG (1000 UT) CAPS Take 1 capsule by mouth daily.     . clindamycin (CLEOCIN) 300 MG capsule Take 300 mg by mouth once. Prior to dental appointment    . Coenzyme Q10 (CO  Q-10) 100 MG CAPS Take 1 tablet by mouth daily.     Marland Kitchen ezetimibe (ZETIA) 10 MG tablet TAKE 1 TABLET BY MOUTH  DAILY 90 tablet 3  . gabapentin (NEURONTIN) 300 MG capsule TAKE 3 CAPSULES BY MOUTH  TWICE DAILY 540 capsule 3  . Liniments (ACE PAIN RELIEVING PATCH EX) Apply topically. Ameo    . lisinopril-hydrochlorothiazide (ZESTORETIC) 20-25 MG tablet TAKE 1 AND 1/2 TABLETS BY  MOUTH DAILY 135 tablet 1  . meloxicam (MOBIC) 15 MG tablet TAKE 1 TABLET BY MOUTH  DAILY 90 tablet 3  . Multiple Vitamins-Minerals (MULTIVITAMIN WITH MINERALS) tablet Take 1 tablet by mouth daily.    Marland Kitchen omeprazole (PRILOSEC) 20 MG capsule TAKE 1 CAPSULE BY MOUTH  DAILY 90 capsule 3  . Sodium Fluoride (CLINPRO 5000) 1.1 % PSTE Place 1 application onto teeth 2 (two) times daily. 112 g 0  . traMADol (ULTRAM) 50 MG tablet TAKE 1/2 TO 1 TABLET BY  MOUTH EVERY 8 HOURS AS  NEEDED FOR PAIN (Patient not taking: Reported on 10/30/2020) 90 tablet 0   No current facility-administered medications for this visit.    Review of Systems  Constitutional: Negative for chills, diaphoresis, fever, malaise/fatigue and weight loss (stable).       Feels "ok".  HENT: Negative.  Negative for congestion, ear discharge, ear pain, hearing loss, nosebleeds, sinus pain, sore throat and tinnitus.   Eyes: Negative.  Negative for blurred vision, double vision, photophobia and pain.  Respiratory: Negative.  Negative for cough, hemoptysis, sputum production and shortness of breath.   Cardiovascular: Negative for chest pain, palpitations, leg swelling and PND.  Gastrointestinal: Negative for abdominal pain, blood in stool, constipation, diarrhea, heartburn, melena, nausea and vomiting.  Genitourinary: Negative.  Negative for dysuria, frequency, hematuria and urgency.  Musculoskeletal: Positive for back pain (chronic) and joint pain (hips and knees). Negative for falls, myalgias and neck pain.       Legs buckle when she walks.  Skin: Negative.  Negative for  itching and rash.  Neurological: Positive for sensory change (stable neuropathy on gabapentin). Negative for dizziness, tremors, speech change, focal weakness, weakness and headaches.       Chronic balance issues.  Endo/Heme/Allergies: Negative for environmental allergies. Does not bruise/bleed easily.  Psychiatric/Behavioral: Negative.  Negative for depression and memory loss. The patient is not nervous/anxious and does not have insomnia.   All other systems reviewed and are negative.  Performance status (ECOG): 2  Vitals Blood pressure (!) 151/70, pulse 65, temperature (!) 96 F (35.6 C), temperature source Tympanic, weight 195 lb 7 oz (88.6 kg), SpO2 96 %.   Physical Exam Vitals  and nursing note reviewed.  Constitutional:      General: She is not in acute distress.    Appearance: Normal appearance. She is well-developed. She is not diaphoretic.     Comments: She has a rolling walker at her side. Needs assistance on and off exam room table.  HENT:     Head: Normocephalic and atraumatic.     Comments: Pearline Cables hair pulled back.    Mouth/Throat:     Mouth: Mucous membranes are dry.     Pharynx: Oropharynx is clear. No oropharyngeal exudate.  Eyes:     General: No scleral icterus.       Right eye: No discharge.        Left eye: No discharge.     Extraocular Movements: Extraocular movements intact.     Pupils: Pupils are equal, round, and reactive to light.     Comments: Glasses. Right eye is injected.  Neck:     Vascular: No JVD.  Cardiovascular:     Rate and Rhythm: Normal rate and regular rhythm.     Heart sounds: Normal heart sounds. No murmur heard.  No friction rub. No gallop.   Pulmonary:     Effort: Pulmonary effort is normal. No respiratory distress.     Breath sounds: Normal breath sounds. No wheezing or rales.  Chest:     Breasts:        Right: No swelling, bleeding, mass, nipple discharge, skin change or tenderness.        Left: Skin change (fibrocystic changes  medially. Larger area of fibrocystic changes/scarring at 3 o clock into the axilla.) and tenderness present. No swelling, bleeding, mass or nipple discharge.  Abdominal:     General: Bowel sounds are normal. There is no distension.     Palpations: Abdomen is soft. There is no mass.     Tenderness: There is no abdominal tenderness. There is no guarding or rebound.  Musculoskeletal:        General: Tenderness (BLE) present. No swelling. Normal range of motion.     Cervical back: Normal range of motion and neck supple.     Right lower leg: Edema (chronic 3+) present.     Left lower leg: Edema (chronic 3+) present.  Lymphadenopathy:     Head:     Right side of head: No preauricular, posterior auricular or occipital adenopathy.     Left side of head: No preauricular, posterior auricular or occipital adenopathy.     Cervical: No cervical adenopathy.     Upper Body:     Right upper body: No supraclavicular or axillary adenopathy.     Left upper body: No supraclavicular or axillary adenopathy.     Lower Body: No right inguinal adenopathy. No left inguinal adenopathy.  Skin:    General: Skin is warm and dry.     Coloration: Skin is not pale.     Findings: No erythema or rash.  Neurological:     Mental Status: She is alert and oriented to person, place, and time.  Psychiatric:        Behavior: Behavior normal.        Thought Content: Thought content normal.        Judgment: Judgment normal.    Imaging studies: 09/16/2017: PET scanrevealed widespread scattered hypermetabolic lytic osseous metastasesthroughout the axial and proximal appendicular skeleton. Bilateral proximal femoral skeletal metastases placed the patient at risk for pathologic hip fractures. There was mild hypermetabolic right hilar lymphadenopathy.  09/23/2017: Plain films  of the bilateral femursrevealed no pathologic fracture. The LEFT view showed lytic subtrochanteric metastasis that was previously seen on PET scan.   06/01/2018: PET scandemonstrated marked improvementin the osseous metastatic disease. Previously demonstrated areas of abnormally high metabolic activity have resolved, with the exception of a very faint area of residual activity (SUV 2.6; previously 11.5) in the LEFT subtrochanteric region. Previous LEFT axillary fluid collection now shows a 1.8 x 1.2 cm scarlike density with low-grade activity (SUV 2.7). Incidental findings include a small hiatal hernia and diverticulosis of the sigmoid colon.  02/11/2018: Lumbar spine MRI revealed multifocal lumbosacral vertebral body signal abnormalities consistent with osseous metastatic disease. The largest lesion was at L1. There was no pathologic compression fracture. There was multi-level moderate to severe neural foraminal stenosis, which had progressed since the prior study. 09/26/2018: Sacral MRI revealed scattered small marrow lesionsc/wmetastatic disease.The0.6 cm lesion in the S1 vertebral bodywasnew since the comparison lumbar spine MRI. Small lesion in the left ilium adjacent to the SI joint was present on the prior MRI. The remaining lesions werenot included on the prior MRI. There was nofracture or other acute abnormality.There was lower lumbar spondylosisand diverticulosis. 10/15/2018: PET scanrevealed no evidence of active breast cancer metastasis on FDG PET scan. There were stable lytic lesions in the spine without evidence of metabolic activity consistent treated metastasis. 05/20/2019:PET scanrevealed no evidence of breast cancer recurrence or progression.There were stable lytic lesions in the spine without associated metabolic activity.CT datawasdegraded by patient motion. 09/16/2019:  Bone density revealed osteopenia with a T-score of -1.9 at the forearm radius.  11/19/2019:  Bone scan revealed no signs of uptake to suggest metastatic disease. Some areas of increased uptake in the cervical and lumbar spine were more  compatible with degenerative changes based on comparison with previous CT evaluation.   Appointment on 10/30/2020  Component Date Value Ref Range Status  . Sodium 10/30/2020 134* 135 - 145 mmol/L Final  . Potassium 10/30/2020 3.6  3.5 - 5.1 mmol/L Final  . Chloride 10/30/2020 97* 98 - 111 mmol/L Final  . CO2 10/30/2020 26  22 - 32 mmol/L Final  . Glucose, Bld 10/30/2020 122* 70 - 99 mg/dL Final   Glucose reference range applies only to samples taken after fasting for at least 8 hours.  . BUN 10/30/2020 24* 8 - 23 mg/dL Final  . Creatinine, Ser 10/30/2020 0.89  0.44 - 1.00 mg/dL Final  . Calcium 10/30/2020 9.3  8.9 - 10.3 mg/dL Final  . Total Protein 10/30/2020 7.1  6.5 - 8.1 g/dL Final  . Albumin 10/30/2020 4.1  3.5 - 5.0 g/dL Final  . AST 10/30/2020 23  15 - 41 U/L Final  . ALT 10/30/2020 14  0 - 44 U/L Final  . Alkaline Phosphatase 10/30/2020 69  38 - 126 U/L Final  . Total Bilirubin 10/30/2020 0.4  0.3 - 1.2 mg/dL Final  . GFR, Estimated 10/30/2020 >60  >60 mL/min Final   Comment: (NOTE) Calculated using the CKD-EPI Creatinine Equation (2021)   . Anion gap 10/30/2020 11  5 - 15 Final   Performed at [facility], [address] [place], [place] [address]  . WBC 10/30/2020 6.3  4.0 - 10.5 K/uL Final  . RBC 10/30/2020 4.24  3.87 - 5.11 MIL/uL Final  . Hemoglobin 10/30/2020 12.6  12.0 - 15.0 g/dL Final  . HCT 10/30/2020 36.8  36 - 46 % Final  . MCV 10/30/2020 86.8  80.0 - 100.0 fL Final  . MCH 10/30/2020 29.7  26.0 - 34.0 pg Final  . MCHC 10/30/2020 34.2  30.0 - 36.0 g/dL Final  . RDW 10/30/2020 14.9  11.5 - 15.5 % Final  . Platelets 10/30/2020 217  150 - 400 K/uL Final   Comment: Immature Platelet Fraction may be clinically indicated, consider ordering this additional test [id number]   . nRBC 10/30/2020 0.0  0.0 - 0.2 % Final  . Neutrophils Relative % 10/30/2020 64  % Final  . Neutro Abs 10/30/2020 4.1  1.7 - 7.7 K/uL Final  . Lymphocytes Relative 10/30/2020 20   % Final  . Lymphs Abs 10/30/2020 1.3  0.7 - 4.0 K/uL Final  . Monocytes Relative 10/30/2020 8  % Final  . Monocytes Absolute 10/30/2020 0.5  0.1 - 1.0 K/uL Final  . Eosinophils Relative 10/30/2020 6  % Final  . Eosinophils Absolute 10/30/2020 0.4  0.0 - 0.5 K/uL Final  . Basophils Relative 10/30/2020 1  % Final  . Basophils Absolute 10/30/2020 0.1  0.0 - 0.1 K/uL Final  . Immature Granulocytes 10/30/2020 1  % Final  . Abs Immature Granulocytes 10/30/2020 0.03  0.00 - 0.07 K/uL Final   Performed at [facility], [address] [place], [place] [address]    Assessment:  DEBORAH LAZCANO is a 86 y.o. female with metastatic breast cancer s/p left axillary biopsy on 07/10/2017. Pathology revealed a 1.1 cm grade II invasive mammary carcinoma with focal lobular features. Wide excisionon 08/20/2017 revealed metastatic mammary carcinoma involving 1 of 4 axillary lymph nodes with extracapsular extension. The superior medial margin was involved. Tumor was ER + (>90%), PR + (>90%), and Her2/neu 2+ (FISH -).   [facility] genetic testingon 09/26/2017 revealed no pathogenic sequence variants or deletions/duplications. BRCA1/2 testing on 09/26/2017 was negative.  She has a history of left breast cancers/p wide excisionand sentinel lymph node biopsy on 07/18/1999 at [facility]. Pathology revealed a 0.8 x 0.6 x 0.4 cm grade II invasive ductal adenocarcinoma. There was no lymphovascular invasion. 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 Margins were negative. Tumor was ER + (50%) and PR + (90%). Pathologic stagewas T1bN0. She received 6200 cGyfrom 08/21/1999 - 10/03/1999. She completed 5 years of tamoxifenin 06/2004.   PET scanon 09/16/2017 revealed widespread scattered hypermetabolic lytic osseous metastases throughout the axial and proximal appendicular skeleton. Bilateral proximal femoral skeletal metastases placed the patient at risk for pathologic hip fractures.  There was mild hypermetabolic right hilar lymphadenopathy. Plain films of the bilateral femurson 09/23/2017 revealed no pathologic fracture. The LEFT view showed lytic subtrochanteric metastasis that was previously seen on PET scan.   PET scan on 05/20/2019 revealedno evidence of breast cancer recurrence or progression.There were stable lytic lesions in the spine without associated metabolic activity.CT datawasdegraded by patient motion.  Bone scan on 11/19/2019 showed no signs of uptake to suggest metastatic disease. Some areas of increased uptake in the cervical and lumbar spine were more compatible with degenerative changes based on comparison with previous CT evaluation.  CA27.29has been followed: 14.7 on 02/05/2017, 19.1 on 02/06/2012, 172.2 on 09/04/2017, 144.9 on 11/06/2017, 73.4 on 02/05/2018, 63.3 on 03/06/2018, 47.2 on 04/06/2018, 42.4 on 05/07/2018, 36.6 on 06/08/2018, 34.6 on 07/06/2018, 27.1 on 08/06/2018, 28.2 on 09/10/2018, 26.0 on 10/09/2018, 30.9 on 11/09/2018, 27.7 on 12/07/2018, 35.8 on 01/04/2019, 24.8 on 02/01/2019, 24.2 on 04/26/2019, 21.6 on 05/24/2019, 24.5 on 07/19/2019, 20.6 on 10/12/2019, 18.5 on 11/10/2019, 20.0 on 02/02/2020, 20.1 on 04/26/2020, 16.6 on 07/27/2020, and 16.5 on 10/30/2020.  She received a palliative course of  radiation(3000 cGy in 10 fractions) to the left hip from 10/14/2017 - 10/27/2017. She received a course of palliative radiationthe the lumbar spine and SI joints from 03/04/2018 - 03/27/2018.  She began Faslodexon 09/26/2017 (last 09/25/2020). She began monthly Xgevaon 09/26/2017 (last 05/07/2018). She has dental issues.  Bone densityon 09/01/2017 revealed osteopeniawith a T-score of -1.9 in the right femoral neck and left forearm. Bone density on 09/16/2019 revealed osteopenia with a T-score of -1.9 at the forearm radius.  She is on calcium and vitamin D.  She received both doses of the [place] COVID-19 vaccine in 03/2020.    Symptomatically,  she has been "fine".  She describes lower back, hip and knee pain.  Her legs sometimes buckle when she is walking.  She has a chronic neuropathy.  Exam is stable.   Plan: 1.   Labs today: CBC with diff CMP, CA 27.29 2.Metastatic breast cancer Clinically, she appears to be doing well. Exam reveals no evidence of recurrent disease.  CA 27.29 is 16.5 (normal). PET scan on06/10/2019 revealed no evidence of progression. Bone scan on 11/19/2019 reveled no evidence of active metastatic disease. Discuss plan for restaging studies in 11/2020. Labs reviewed.  Faslodex today. 3.Bone metastasis Patient notes chronic back pain which now involves entire lower back.   She has known degenerative changes. She had no evidence of metastatic disease on PET scan from 05/20/2019.  There was no evidence of active disease on bone scan from 11/19/2019. Plan for follow-up PET scan to r/o recurrent disease. She has been unable to receive Xgeva secondary to dental issues. 4.Osteopenia Continue calcium and vitamin D. Patient to notify clinic if cleared for future Prolia. RN to call dentist about dental clearance. 5.   Faslodex today. 6.   PET scan on 11/21/2020. 7.   RTC in 1 month for MD assessment, labs (CBC with diff CMP), review of PET scan and +/- Faslodex.  I discussed the assessment and treatment plan with the patient.  The patient was provided an opportunity to ask questions and all were answered.  The patient agreed with the plan and demonstrated an understanding of the instructions.  The patient was advised to call back if the symptoms worsen or if the condition fails to improve as anticipated.   Lequita Asal, MD, PhD    10/30/2020, 10:44 AM  I, Evert Kohl, am acting as a Education administrator for Lequita Asal, MD.  I, [place] Mike Gip, MD, have reviewed the above documentation for accuracy and completeness, and I agree with the above.

## 2020-10-30 ENCOUNTER — Telehealth: Payer: Self-pay

## 2020-10-30 ENCOUNTER — Encounter: Payer: Self-pay | Admitting: Hematology and Oncology

## 2020-10-30 ENCOUNTER — Inpatient Hospital Stay: Payer: Medicare Other

## 2020-10-30 ENCOUNTER — Other Ambulatory Visit: Payer: Self-pay

## 2020-10-30 ENCOUNTER — Inpatient Hospital Stay: Payer: Medicare Other | Attending: Hematology and Oncology

## 2020-10-30 ENCOUNTER — Inpatient Hospital Stay: Payer: Medicare Other | Attending: Hematology and Oncology | Admitting: Hematology and Oncology

## 2020-10-30 VITALS — BP 151/70 | HR 65 | Temp 96.0°F | Wt 195.4 lb

## 2020-10-30 DIAGNOSIS — Z170 Estrogen receptor positive status [ER+]: Secondary | ICD-10-CM | POA: Diagnosis not present

## 2020-10-30 DIAGNOSIS — C773 Secondary and unspecified malignant neoplasm of axilla and upper limb lymph nodes: Secondary | ICD-10-CM | POA: Diagnosis not present

## 2020-10-30 DIAGNOSIS — Z923 Personal history of irradiation: Secondary | ICD-10-CM | POA: Diagnosis not present

## 2020-10-30 DIAGNOSIS — M85851 Other specified disorders of bone density and structure, right thigh: Secondary | ICD-10-CM

## 2020-10-30 DIAGNOSIS — Z79899 Other long term (current) drug therapy: Secondary | ICD-10-CM | POA: Diagnosis not present

## 2020-10-30 DIAGNOSIS — G629 Polyneuropathy, unspecified: Secondary | ICD-10-CM | POA: Diagnosis not present

## 2020-10-30 DIAGNOSIS — C50912 Malignant neoplasm of unspecified site of left female breast: Secondary | ICD-10-CM | POA: Insufficient documentation

## 2020-10-30 DIAGNOSIS — Z5111 Encounter for antineoplastic chemotherapy: Secondary | ICD-10-CM | POA: Diagnosis not present

## 2020-10-30 DIAGNOSIS — C50412 Malignant neoplasm of upper-outer quadrant of left female breast: Secondary | ICD-10-CM | POA: Insufficient documentation

## 2020-10-30 DIAGNOSIS — C7951 Secondary malignant neoplasm of bone: Secondary | ICD-10-CM | POA: Diagnosis not present

## 2020-10-30 DIAGNOSIS — I10 Essential (primary) hypertension: Secondary | ICD-10-CM | POA: Diagnosis not present

## 2020-10-30 LAB — CBC WITH DIFFERENTIAL/PLATELET
Abs Immature Granulocytes: 0.03 10*3/uL (ref 0.00–0.07)
Basophils Absolute: 0.1 10*3/uL (ref 0.0–0.1)
Basophils Relative: 1 %
Eosinophils Absolute: 0.4 10*3/uL (ref 0.0–0.5)
Eosinophils Relative: 6 %
HCT: 36.8 % (ref 36.0–46.0)
Hemoglobin: 12.6 g/dL (ref 12.0–15.0)
Immature Granulocytes: 1 %
Lymphocytes Relative: 20 %
Lymphs Abs: 1.3 10*3/uL (ref 0.7–4.0)
MCH: 29.7 pg (ref 26.0–34.0)
MCHC: 34.2 g/dL (ref 30.0–36.0)
MCV: 86.8 fL (ref 80.0–100.0)
Monocytes Absolute: 0.5 10*3/uL (ref 0.1–1.0)
Monocytes Relative: 8 %
Neutro Abs: 4.1 10*3/uL (ref 1.7–7.7)
Neutrophils Relative %: 64 %
Platelets: 217 10*3/uL (ref 150–400)
RBC: 4.24 MIL/uL (ref 3.87–5.11)
RDW: 14.9 % (ref 11.5–15.5)
WBC: 6.3 10*3/uL (ref 4.0–10.5)
nRBC: 0 % (ref 0.0–0.2)

## 2020-10-30 LAB — COMPREHENSIVE METABOLIC PANEL
ALT: 14 U/L (ref 0–44)
AST: 23 U/L (ref 15–41)
Albumin: 4.1 g/dL (ref 3.5–5.0)
Alkaline Phosphatase: 69 U/L (ref 38–126)
Anion gap: 11 (ref 5–15)
BUN: 24 mg/dL — ABNORMAL HIGH (ref 8–23)
CO2: 26 mmol/L (ref 22–32)
Calcium: 9.3 mg/dL (ref 8.9–10.3)
Chloride: 97 mmol/L — ABNORMAL LOW (ref 98–111)
Creatinine, Ser: 0.89 mg/dL (ref 0.44–1.00)
GFR, Estimated: >60 mL/min (ref >60)
Glucose, Bld: 122 mg/dL — ABNORMAL HIGH (ref 70–99)
Potassium: 3.6 mmol/L (ref 3.5–5.1)
Sodium: 134 mmol/L — ABNORMAL LOW (ref 135–145)
Total Bilirubin: 0.4 mg/dL (ref 0.3–1.2)
Total Protein: 7.1 g/dL (ref 6.5–8.1)

## 2020-10-30 MED ORDER — FULVESTRANT 250 MG/5ML IM SOLN
500.0000 mg | Freq: Once | INTRAMUSCULAR | Status: AC
  Administered 2020-10-30: 500 mg via INTRAMUSCULAR

## 2020-10-30 MED ORDER — FULVESTRANT 250 MG/5ML IM SOLN
INTRAMUSCULAR | Status: AC
  Filled 2020-10-30: qty 10

## 2020-10-30 NOTE — Progress Notes (Signed)
Patient here for oncology follow-up appointment, expresses concerns of leg swelling, lightheadedness, urinary frequency.

## 2020-10-31 ENCOUNTER — Ambulatory Visit: Payer: Medicare Other | Admitting: Hematology and Oncology

## 2020-10-31 ENCOUNTER — Other Ambulatory Visit: Payer: Medicare Other

## 2020-10-31 ENCOUNTER — Ambulatory Visit: Payer: Medicare Other

## 2020-10-31 ENCOUNTER — Telehealth: Payer: Self-pay | Admitting: *Deleted

## 2020-10-31 LAB — CANCER ANTIGEN 27.29: CA 27.29: 16.5 U/mL (ref 0.0–38.6)

## 2020-10-31 NOTE — Telephone Encounter (Signed)
Pt requests that a copy of her EKG from 10/23/20 be mailed to her as well as sent to her oncologist, Dr. Mike Gip.

## 2020-10-31 NOTE — Telephone Encounter (Signed)
EKG from 11/15 mailed to pt and faxed to Dr. Mike Gip via [facility] fax per pt request.

## 2020-11-10 DIAGNOSIS — C50112 Malignant neoplasm of central portion of left female breast: Secondary | ICD-10-CM | POA: Diagnosis not present

## 2020-11-10 DIAGNOSIS — Z4432 Encounter for fitting and adjustment of external left breast prosthesis: Secondary | ICD-10-CM | POA: Diagnosis not present

## 2020-11-21 ENCOUNTER — Other Ambulatory Visit: Payer: Self-pay

## 2020-11-21 ENCOUNTER — Encounter
Admission: RE | Admit: 2020-11-21 | Discharge: 2020-11-21 | Disposition: A | Discharge status: Home / Self Care | Payer: Medicare Other | Source: Ambulatory Visit | Attending: Hematology and Oncology | Admitting: Hematology and Oncology

## 2020-11-21 DIAGNOSIS — C50412 Malignant neoplasm of upper-outer quadrant of left female breast: Secondary | ICD-10-CM | POA: Insufficient documentation

## 2020-11-21 DIAGNOSIS — C7951 Secondary malignant neoplasm of bone: Secondary | ICD-10-CM | POA: Insufficient documentation

## 2020-11-21 DIAGNOSIS — I2510 Atherosclerotic heart disease of native coronary artery without angina pectoris: Secondary | ICD-10-CM | POA: Insufficient documentation

## 2020-11-21 DIAGNOSIS — Z170 Estrogen receptor positive status [ER+]: Secondary | ICD-10-CM | POA: Insufficient documentation

## 2020-11-21 DIAGNOSIS — I700 Atherosclerosis of aorta: Secondary | ICD-10-CM | POA: Insufficient documentation

## 2020-11-21 DIAGNOSIS — R948 Abnormal results of function studies of other organs and systems: Secondary | ICD-10-CM | POA: Diagnosis not present

## 2020-11-21 LAB — GLUCOSE, CAPILLARY: Glucose-Capillary: 96 mg/dL (ref 70–99)

## 2020-11-21 MED ORDER — FLUDEOXYGLUCOSE F - 18 (FDG) INJECTION
10.1000 | Freq: Once | INTRAVENOUS | Status: AC | PRN
  Administered 2020-11-21: 13:00:00 10.13 via INTRAVENOUS

## 2020-11-22 NOTE — Progress Notes (Signed)
[facility]  [address], [address] [place], [place] [address] Phone: [phone]  Fax: [phone]   Clinic Day:  11/27/2020  Referring physician: Glean Hess, MD  Chief Complaint: Kelli Williams is a 86 y.o. female with metastatic breast cancer who is seen for 1 month assessment, review of PET scan, and continuation of monthly Faslodex.    HPI: The patient was last seen in the medical onoclogy clinic on 10/30/2020. At that time, she had been "fine".  She described lower back, hip and knee pain.  Her legs sometimes buckled when she walked.  She had a chronic neuropathy.  Exam was stable. Hematocrit was 36.8, hemoglobin 12.6, platelets 217,000, WBC 6,300. Sodium was 134. CA27.29 was 16.5. She received Faslodex.  PET scan on 11/21/2020 revealed no evidence of breast cancer recurrence or progression. There was no visceral metastasis or nodal metastasis. The lucent lesions within the lower thoracic spine and lumbar spine without metabolic activity were c/w treated metastasis.   During the interim, she has been "about the same." Her back, hip, and knee pain are stable.  She is ready to receive Faslodex today.   Past Medical History:  Diagnosis Date  . Anxiety   . Arthritis    BOTH FEET AND KNEES  . Breast cancer ([place]) 2000   left breast ca with lumpectomy and rad tx 5 yr tamoxifen/Dr Jeannine Kitten at [place]  . Complication of anesthesia    WOKE UP DURING SURGERY FOR DEVIATED SEPTUM, KNEE REPLACEMENT AND BREAST LUMPECTOMY  . Dysrhythmia   . GERD (gastroesophageal reflux disease)   . Hypertension   . Neuropathic pain of both legs   . Neuropathy   . Personal history of chemotherapy   . Personal history of radiation therapy   . Pneumonia 2016  . Skin cancer of forehead   . Skin cancer of nose     Past Surgical History:  Procedure Laterality Date  . ABDOMINAL HYSTERECTOMY    . AXILLARY LYMPH NODE BIOPSY Left 07/10/2017   INVASIVE MAMMARY CARCINOMA WITH  FOCAL LOBULAR FEATURES.   Marland Kitchen BREAST BIOPSY Bilateral 1960   benign  . BREAST LUMPECTOMY Left 2000   breast ca with rad tx  . BREAST LUMPECTOMY Left 08/20/2017  . BREAST LUMPECTOMY Left 08/20/2017   Procedure: left breast wide excision;  Surgeon: Robert Bellow, MD;  Location: [facility] ORS;  Service: General;  Laterality: Left;  . CATARACT EXTRACTION     left eye  . CHOLECYSTECTOMY    . JOINT REPLACEMENT    . NASAL SEPTUM SURGERY    . PARTIAL KNEE ARTHROPLASTY     right  . TOTAL VAGINAL HYSTERECTOMY      Family History  Problem Relation Age of Onset  . Heart disease Mother   . Heart attack Paternal Uncle   . Breast cancer Daughter 63       genetic negative  . Cancer Paternal Grandmother     Social History:  reports that she has never smoked. She has never used smokeless tobacco. She reports that she does not drink alcohol and does not use drugs. The patient's husband died 2 years ago.She was born outside of [place].She previously worked as an Web designer. She lives at the [facility] at [place]. She is independent except for the use of her walker. Patient has a daughter, Kelli Williams. The patient is alone today.   Allergies:  Allergies  Allergen Reactions  . Amitriptyline Hives  . Amoxicillin     Diarrhea    .  Black Pepper [[place]]   . Ivp Dye [Iodinated Diagnostic Agents]   . Tape Other (See Comments)    [place] - please use paper tape.  . Sulfa Antibiotics Rash  . Sulfa Antibiotics Rash    Current Medications: Current Outpatient Medications  Medication Sig Dispense Refill  . acetaminophen (TYLENOL) 325 MG tablet Take 650 mg by mouth every 6 (six) hours as needed.    Marland Kitchen aspirin 81 MG tablet Take 1 tablet by mouth daily.    . Calcium-Magnesium 500-250 MG TABS Take 1 tablet by mouth daily.     . Cholecalciferol (VITAMIN D3) 25 MCG (1000 UT) CAPS Take 1 capsule by mouth daily.     . clindamycin (CLEOCIN) 300 MG capsule Take 300 mg by mouth once. Prior to  dental appointment    . Coenzyme Q10 (CO Q-10) 100 MG CAPS Take 1 tablet by mouth daily.     Marland Kitchen ezetimibe (ZETIA) 10 MG tablet TAKE 1 TABLET BY MOUTH  DAILY 90 tablet 3  . gabapentin (NEURONTIN) 300 MG capsule TAKE 3 CAPSULES BY MOUTH  TWICE DAILY 540 capsule 3  . Liniments (ACE PAIN RELIEVING PATCH EX) Apply topically. Ameo    . lisinopril-hydrochlorothiazide (ZESTORETIC) 20-25 MG tablet TAKE 1 AND 1/2 TABLETS BY  MOUTH DAILY 135 tablet 1  . meloxicam (MOBIC) 15 MG tablet TAKE 1 TABLET BY MOUTH  DAILY 90 tablet 3  . Multiple Vitamins-Minerals (MULTIVITAMIN WITH MINERALS) tablet Take 1 tablet by mouth daily.    Marland Kitchen omeprazole (PRILOSEC) 20 MG capsule TAKE 1 CAPSULE BY MOUTH  DAILY 90 capsule 3  . Sodium Fluoride (CLINPRO 5000) 1.1 % PSTE Place 1 application onto teeth 2 (two) times daily. 112 g 0  . traMADol (ULTRAM) 50 MG tablet TAKE 1/2 TO 1 TABLET BY  MOUTH EVERY 8 HOURS AS  NEEDED FOR PAIN (Patient not taking: No sig reported) 90 tablet 0   No current facility-administered medications for this visit.    Review of Systems  Constitutional: Negative for chills, diaphoresis, fever, malaise/fatigue and weight loss (stable).       Feels "about the same."  HENT: Negative.  Negative for congestion, ear discharge, ear pain, hearing loss, nosebleeds, sinus pain, sore throat and tinnitus.   Eyes: Negative.  Negative for blurred vision, double vision, photophobia and pain.  Respiratory: Negative.  Negative for cough, hemoptysis, sputum production and shortness of breath.   Cardiovascular: Negative for chest pain, palpitations, leg swelling and PND.  Gastrointestinal: Negative.  Negative for abdominal pain, blood in stool, constipation, diarrhea, heartburn, melena, nausea and vomiting.  Genitourinary: Negative.  Negative for dysuria, frequency, hematuria and urgency.  Musculoskeletal: Positive for back pain (chronic) and joint pain (hips and knees). Negative for falls, myalgias and neck pain.  Skin:  Negative.  Negative for itching and rash.  Neurological: Positive for sensory change (stable neuropathy on gabapentin). Negative for dizziness, tingling, weakness and headaches.       Chronic balance issues.  Endo/Heme/Allergies: Negative.  Negative for environmental allergies. Does not bruise/bleed easily.  Psychiatric/Behavioral: Negative.  Negative for depression and memory loss. The patient is not nervous/anxious and does not have insomnia.   All other systems reviewed and are negative.  Performance status (ECOG): 2  Vitals Pulse 72, temperature 98 F (36.7 C), weight 195 lb (88.5 kg), SpO2 98 %.   Physical Exam Vitals and nursing note reviewed.  Constitutional:      General: She is not in acute distress.    Appearance: Normal appearance.  She is well-developed. She is not diaphoretic.     Comments: Needs assistance on and off exam room table.  HENT:     Head: Normocephalic and atraumatic.     Comments: Pearline Cables hair pulled back.    Mouth/Throat:     Mouth: Mucous membranes are moist.     Pharynx: Oropharynx is clear. No oropharyngeal exudate.  Eyes:     General: No scleral icterus.       Right eye: No discharge.        Left eye: No discharge.     Extraocular Movements: Extraocular movements intact.     Pupils: Pupils are equal, round, and reactive to light.     Comments: Glasses.  Neck:     Vascular: No JVD.  Cardiovascular:     Rate and Rhythm: Normal rate and regular rhythm.     Heart sounds: Normal heart sounds. No murmur heard. No friction rub. No gallop.   Pulmonary:     Effort: Pulmonary effort is normal. No respiratory distress.     Breath sounds: Normal breath sounds. No wheezing or rales.  Chest:  Breasts:     Right: No axillary adenopathy or supraclavicular adenopathy.     Left: No axillary adenopathy or supraclavicular adenopathy.    Abdominal:     General: Bowel sounds are normal. There is no distension.     Palpations: Abdomen is soft. There is no mass.      Tenderness: There is no abdominal tenderness. There is no guarding or rebound.  Musculoskeletal:        General: No swelling or tenderness. Normal range of motion.     Cervical back: Normal range of motion and neck supple.     Right lower leg: Edema (chronic) present.     Left lower leg: Edema (chronic) present.  Lymphadenopathy:     Head:     Right side of head: No preauricular, posterior auricular or occipital adenopathy.     Left side of head: No preauricular, posterior auricular or occipital adenopathy.     Cervical: No cervical adenopathy.     Upper Body:     Right upper body: No supraclavicular or axillary adenopathy.     Left upper body: No supraclavicular or axillary adenopathy.     Lower Body: No right inguinal adenopathy. No left inguinal adenopathy.  Skin:    General: Skin is warm and dry.     Coloration: Skin is not pale.     Findings: No erythema or rash.  Neurological:     Mental Status: She is alert and oriented to person, place, and time.  Psychiatric:        Behavior: Behavior normal.        Thought Content: Thought content normal.        Judgment: Judgment normal.    Imaging studies: 09/16/2017: PET scanrevealed widespread scattered hypermetabolic lytic osseous metastasesthroughout the axial and proximal appendicular skeleton. Bilateral proximal femoral skeletal metastases placed the patient at risk for pathologic hip fractures. There was mild hypermetabolic right hilar lymphadenopathy.  09/23/2017: Plain films of the bilateral femursrevealed no pathologic fracture. The LEFT view showed lytic subtrochanteric metastasis that was previously seen on PET scan.  06/01/2018: PET scandemonstrated marked improvementin the osseous metastatic disease. Previously demonstrated areas of abnormally high metabolic activity have resolved, with the exception of a very faint area of residual activity (SUV 2.6; previously 11.5) in the LEFT subtrochanteric region.  Previous LEFT axillary fluid collection now shows a 1.8 x  1.2 cm scarlike density with low-grade activity (SUV 2.7). Incidental findings include a small hiatal hernia and diverticulosis of the sigmoid colon.  02/11/2018: Lumbar spine MRI revealed multifocal lumbosacral vertebral body signal abnormalities consistent with osseous metastatic disease. The largest lesion was at L1. There was no pathologic compression fracture. There was multi-level moderate to severe neural foraminal stenosis, which had progressed since the prior study. 09/26/2018: Sacral MRI revealed scattered small marrow lesionsc/wmetastatic disease.The0.6 cm lesion in the S1 vertebral bodywasnew since the comparison lumbar spine MRI. Small lesion in the left ilium adjacent to the SI joint was present on the prior MRI. The remaining lesions werenot included on the prior MRI. There was nofracture or other acute abnormality.There was lower lumbar spondylosisand diverticulosis. 10/15/2018: PET scanrevealed no evidence of active breast cancer metastasis on FDG PET scan. There were stable lytic lesions in the spine without evidence of metabolic activity consistent treated metastasis. 05/20/2019:PET scanrevealed no evidence of breast cancer recurrence or progression.There were stable lytic lesions in the spine without associated metabolic activity.CT datawasdegraded by patient motion. 09/16/2019:  Bone density revealed osteopenia with a T-score of -1.9 at the forearm radius.  11/19/2019:  Bone scan revealed no signs of uptake to suggest metastatic disease. Some areas of increased uptake in the cervical and lumbar spine were more compatible with degenerative changes based on comparison with previous CT evaluation. 11/21/2020:  PET scan on revealed no evidence of breast cancer recurrence or progression. There was no visceral metastasis or nodal metastasis. The lucent lesions within the lower thoracic spine and lumbar spine  without metabolic activity were c/w treated metastasis.   Appointment on 11/27/2020  Component Date Value Ref Range Status  . Sodium 11/27/2020 135  135 - 145 mmol/L Final  . Potassium 11/27/2020 4.0  3.5 - 5.1 mmol/L Final  . Chloride 11/27/2020 96* 98 - 111 mmol/L Final  . CO2 11/27/2020 30  22 - 32 mmol/L Final  . Glucose, Bld 11/27/2020 107* 70 - 99 mg/dL Final   Glucose reference range applies only to samples taken after fasting for at least 8 hours.  . BUN 11/27/2020 33* 8 - 23 mg/dL Final  . Creatinine, Ser 11/27/2020 1.03* 0.44 - 1.00 mg/dL Final  . Calcium 11/27/2020 9.1  8.9 - 10.3 mg/dL Final  . Total Protein 11/27/2020 7.2  6.5 - 8.1 g/dL Final  . Albumin 11/27/2020 4.2  3.5 - 5.0 g/dL Final  . AST 11/27/2020 21  15 - 41 U/L Final  . ALT 11/27/2020 13  0 - 44 U/L Final  . Alkaline Phosphatase 11/27/2020 68  38 - 126 U/L Final  . Total Bilirubin 11/27/2020 0.6  0.3 - 1.2 mg/dL Final  . GFR, Estimated 11/27/2020 51* >60 mL/min Final   Comment: (NOTE) Calculated using the CKD-EPI Creatinine Equation (2021)   . Anion gap 11/27/2020 9  5 - 15 Final   Performed at [facility], [address], [place], [place] [address]  . WBC 11/27/2020 8.3  4.0 - 10.5 K/uL Final  . RBC 11/27/2020 4.17  3.87 - 5.11 MIL/uL Final  . Hemoglobin 11/27/2020 12.4  12.0 - 15.0 g/dL Final  . HCT 11/27/2020 36.6  36.0 - 46.0 % Final  . MCV 11/27/2020 87.8  80.0 - 100.0 fL Final  . MCH 11/27/2020 29.7  26.0 - 34.0 pg Final  . MCHC 11/27/2020 33.9  30.0 - 36.0 g/dL Final  . RDW 11/27/2020 14.8  11.5 - 15.5 % Final  . Platelets 11/27/2020 209  150 - 400 K/uL Final  . nRBC 11/27/2020 0.0  0.0 - 0.2 % Final  . Neutrophils Relative % 11/27/2020 71  % Final  . Neutro Abs 11/27/2020 6.0  1.7 - 7.7 K/uL Final  . Lymphocytes Relative 11/27/2020 18  % Final  . Lymphs Abs 11/27/2020 1.5  0.7 - 4.0 K/uL Final  . Monocytes Relative 11/27/2020 7  % Final  . Monocytes Absolute 11/27/2020 0.6  0.1  - 1.0 K/uL Final  . Eosinophils Relative 11/27/2020 3  % Final  . Eosinophils Absolute 11/27/2020 0.3  0.0 - 0.5 K/uL Final  . Basophils Relative 11/27/2020 0  % Final  . Basophils Absolute 11/27/2020 0.0  0.0 - 0.1 K/uL Final  . Immature Granulocytes 11/27/2020 1  % Final  . Abs Immature Granulocytes 11/27/2020 0.04  0.00 - 0.07 K/uL Final   Performed at [facility], [address] [place], [place] [address]    Assessment:  SHAUN ZUCCARO is a 86 y.o. female with metastatic breast cancer s/p left axillary biopsy on 07/10/2017. Pathology revealed a 1.1 cm grade II invasive mammary carcinoma with focal lobular features. Wide excisionon 08/20/2017 revealed metastatic mammary carcinoma involving 1 of 4 axillary lymph nodes with extracapsular extension. The superior medial margin was involved. Tumor was ER + (>90%), PR + (>90%), and Her2/neu 2+ (FISH -).   [facility] genetic testingon 09/26/2017 revealed no pathogenic sequence variants or deletions/duplications. BRCA1/2 testing on 09/26/2017 was negative.  She has a history of left breast cancers/p wide excisionand sentinel lymph node biopsy on 07/18/1999 at [facility]. Pathology revealed a 0.8 x 0.6 x 0.4 cm grade II invasive ductal adenocarcinoma. There was no lymphovascular invasion. 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 Margins were negative. Tumor was ER + (50%) and PR + (90%). Pathologic stagewas T1bN0. She received 6200 cGyfrom 08/21/1999 - 10/03/1999. She completed 5 years of tamoxifenin 06/2004.   PET scanon 09/16/2017 revealed widespread scattered hypermetabolic lytic osseous metastases throughout the axial and proximal appendicular skeleton. Bilateral proximal femoral skeletal metastases placed the patient at risk for pathologic hip fractures. There was mild hypermetabolic right hilar lymphadenopathy. Plain films of the bilateral femurson 09/23/2017 revealed no pathologic fracture. The LEFT  view showed lytic subtrochanteric metastasis that was previously seen on PET scan.   PET scan on 11/21/2020 revealed no evidence of breast cancer recurrence or progression. There was no visceral metastasis or nodal metastasis. The lucent lesions within the lower thoracic spine and lumbar spine without metabolic activity were c/w treated metastasis.  CA27.29has been followed: 14.7 on 02/05/2017, 19.1 on 02/06/2012, 172.2 on 09/04/2017, 144.9 on 11/06/2017, 73.4 on 02/05/2018, 63.3 on 03/06/2018, 47.2 on 04/06/2018, 42.4 on 05/07/2018, 36.6 on 06/08/2018, 34.6 on 07/06/2018, 27.1 on 08/06/2018, 28.2 on 09/10/2018, 26.0 on 10/09/2018, 30.9 on 11/09/2018, 27.7 on 12/07/2018, 35.8 on 01/04/2019, 24.8 on 02/01/2019, 24.2 on 04/26/2019, 21.6 on 05/24/2019, 24.5 on 07/19/2019, 20.6 on 10/12/2019, 18.5 on 11/10/2019, 20.0 on 02/02/2020, 20.1 on 04/26/2020, 16.6 on 07/27/2020, and 16.5 on 10/30/2020.  She received a palliative course of radiation(3000 cGy in 10 fractions) to the left hip from 10/14/2017 - 10/27/2017. She received a course of palliative radiationthe the lumbar spine and SI joints from 03/04/2018 - 03/27/2018.  She began Faslodexon 09/26/2017 (last 09/25/2020). She began monthly Xgevaon 09/26/2017 (last 05/07/2018). She has dental issues.  Bone densityon 09/01/2017 revealed osteopeniawith a T-score of -1.9 in the right femoral neck and left forearm. Bone density on 09/16/2019 revealed osteopenia with a T-score of -  1.9 at the forearm radius.  She is on calcium and vitamin D.  She received both doses of the [place] COVID-19 vaccine in 03/2020.   Symptomatically, she has been "about the same." Her back, hip, and knee pain are stable.  Exam is stable.  Plan: 1.   Labs today: CBC with diff, CMP. 2.Metastatic breast cancer Clinically, she is doing well Exam reveals no evidence of recurrent disease. CA 27.29 was 16.5 (normal) on 10/30/2020. PET scan on12/14/2021 was  personally reviewed.  Agree with radiology findings.  No evidence of breast cancer recurrence or progression. Labs reviewed.  Faslodex today. 3.Bone metastasis Patient notes chronic back hip and knee pain.  She has known degenerative changes. She has no evidence of metastatic disease on PET scan from 11/21/2020.  There was no evidence of bone metastasis on 11/19/2019. Continue to monitor. 4.Osteopenia She is on calcium and vitamin D. Patient to notify clinic if cleared for future Prolia. 5.   Faslodex today. 6.   RTC monthly x 3 for Faslodex. 7.   RTC in 3 months for MD assessment, labs (CBC with diff CMP, CA27.29), and Faslodex.  I discussed the assessment and treatment plan with the patient.  The patient was provided an opportunity to ask questions and all were answered.  The patient agreed with the plan and demonstrated an understanding of the instructions.  The patient was advised to call back if the symptoms worsen or if the condition fails to improve as anticipated.   Lequita Asal, MD, PhD    11/27/2020, 2:14 PM  I, Mirian Mo Tufford, am acting as a Education administrator for Lequita Asal, MD.  I, Peyton Mike Gip, MD, have reviewed the above documentation for accuracy and completeness, and I agree with the above.

## 2020-11-27 ENCOUNTER — Other Ambulatory Visit: Payer: Self-pay

## 2020-11-27 ENCOUNTER — Inpatient Hospital Stay: Payer: Medicare Other

## 2020-11-27 ENCOUNTER — Inpatient Hospital Stay: Payer: Medicare Other | Attending: Hematology and Oncology

## 2020-11-27 ENCOUNTER — Inpatient Hospital Stay (HOSPITAL_BASED_OUTPATIENT_CLINIC_OR_DEPARTMENT_OTHER): Payer: Medicare Other | Admitting: Hematology and Oncology

## 2020-11-27 ENCOUNTER — Encounter: Payer: Self-pay | Admitting: Hematology and Oncology

## 2020-11-27 VITALS — BP 122/78 | HR 72 | Temp 98.0°F | Wt 195.0 lb

## 2020-11-27 DIAGNOSIS — Z5111 Encounter for antineoplastic chemotherapy: Secondary | ICD-10-CM | POA: Insufficient documentation

## 2020-11-27 DIAGNOSIS — C7951 Secondary malignant neoplasm of bone: Secondary | ICD-10-CM | POA: Insufficient documentation

## 2020-11-27 DIAGNOSIS — Z170 Estrogen receptor positive status [ER+]: Secondary | ICD-10-CM | POA: Diagnosis not present

## 2020-11-27 DIAGNOSIS — C50412 Malignant neoplasm of upper-outer quadrant of left female breast: Secondary | ICD-10-CM | POA: Diagnosis not present

## 2020-11-27 DIAGNOSIS — M85851 Other specified disorders of bone density and structure, right thigh: Secondary | ICD-10-CM | POA: Diagnosis not present

## 2020-11-27 DIAGNOSIS — Z79899 Other long term (current) drug therapy: Secondary | ICD-10-CM | POA: Diagnosis not present

## 2020-11-27 LAB — COMPREHENSIVE METABOLIC PANEL
ALT: 13 U/L (ref 0–44)
AST: 21 U/L (ref 15–41)
Albumin: 4.2 g/dL (ref 3.5–5.0)
Alkaline Phosphatase: 68 U/L (ref 38–126)
Anion gap: 9 (ref 5–15)
BUN: 33 mg/dL — ABNORMAL HIGH (ref 8–23)
CO2: 30 mmol/L (ref 22–32)
Calcium: 9.1 mg/dL (ref 8.9–10.3)
Chloride: 96 mmol/L — ABNORMAL LOW (ref 98–111)
Creatinine, Ser: 1.03 mg/dL — ABNORMAL HIGH (ref 0.44–1.00)
GFR, Estimated: 51 mL/min — ABNORMAL LOW (ref >60)
Glucose, Bld: 107 mg/dL — ABNORMAL HIGH (ref 70–99)
Potassium: 4 mmol/L (ref 3.5–5.1)
Sodium: 135 mmol/L (ref 135–145)
Total Bilirubin: 0.6 mg/dL (ref 0.3–1.2)
Total Protein: 7.2 g/dL (ref 6.5–8.1)

## 2020-11-27 LAB — CBC WITH DIFFERENTIAL/PLATELET
Abs Immature Granulocytes: 0.04 10*3/uL (ref 0.00–0.07)
Basophils Absolute: 0 10*3/uL (ref 0.0–0.1)
Basophils Relative: 0 %
Eosinophils Absolute: 0.3 10*3/uL (ref 0.0–0.5)
Eosinophils Relative: 3 %
HCT: 36.6 % (ref 36.0–46.0)
Hemoglobin: 12.4 g/dL (ref 12.0–15.0)
Immature Granulocytes: 1 %
Lymphocytes Relative: 18 %
Lymphs Abs: 1.5 10*3/uL (ref 0.7–4.0)
MCH: 29.7 pg (ref 26.0–34.0)
MCHC: 33.9 g/dL (ref 30.0–36.0)
MCV: 87.8 fL (ref 80.0–100.0)
Monocytes Absolute: 0.6 10*3/uL (ref 0.1–1.0)
Monocytes Relative: 7 %
Neutro Abs: 6 10*3/uL (ref 1.7–7.7)
Neutrophils Relative %: 71 %
Platelets: 209 10*3/uL (ref 150–400)
RBC: 4.17 MIL/uL (ref 3.87–5.11)
RDW: 14.8 % (ref 11.5–15.5)
WBC: 8.3 10*3/uL (ref 4.0–10.5)
nRBC: 0 % (ref 0.0–0.2)

## 2020-11-27 MED ORDER — FULVESTRANT 250 MG/5ML IM SOLN
500.0000 mg | Freq: Once | INTRAMUSCULAR | Status: AC
  Administered 2020-11-27: 15:00:00 500 mg via INTRAMUSCULAR

## 2020-11-27 MED ORDER — FULVESTRANT 250 MG/5ML IM SOLN
INTRAMUSCULAR | Status: AC
  Filled 2020-11-27: qty 10

## 2020-11-27 NOTE — Progress Notes (Signed)
Patient received prescribed treatment in clinic. Tolerated well. Patient stable at discharge. 

## 2020-11-28 ENCOUNTER — Ambulatory Visit: Payer: Medicare Other | Admitting: Hematology and Oncology

## 2020-11-28 ENCOUNTER — Other Ambulatory Visit: Payer: Medicare Other

## 2020-11-28 ENCOUNTER — Ambulatory Visit: Payer: Medicare Other

## 2020-12-25 ENCOUNTER — Inpatient Hospital Stay: Payer: Medicare Other | Attending: Hematology and Oncology

## 2020-12-25 DIAGNOSIS — M85851 Other specified disorders of bone density and structure, right thigh: Secondary | ICD-10-CM

## 2020-12-25 DIAGNOSIS — Z79899 Other long term (current) drug therapy: Secondary | ICD-10-CM | POA: Diagnosis not present

## 2020-12-25 DIAGNOSIS — Z5111 Encounter for antineoplastic chemotherapy: Secondary | ICD-10-CM | POA: Insufficient documentation

## 2020-12-25 DIAGNOSIS — C7951 Secondary malignant neoplasm of bone: Secondary | ICD-10-CM | POA: Insufficient documentation

## 2020-12-25 DIAGNOSIS — C50412 Malignant neoplasm of upper-outer quadrant of left female breast: Secondary | ICD-10-CM | POA: Diagnosis not present

## 2020-12-25 MED ORDER — FULVESTRANT 250 MG/5ML IM SOLN
500.0000 mg | Freq: Once | INTRAMUSCULAR | Status: AC
  Administered 2020-12-25: 500 mg via INTRAMUSCULAR
  Filled 2020-12-25: qty 10

## 2021-01-22 ENCOUNTER — Inpatient Hospital Stay: Payer: Medicare Other | Attending: Hematology and Oncology

## 2021-01-22 DIAGNOSIS — Z5111 Encounter for antineoplastic chemotherapy: Secondary | ICD-10-CM | POA: Diagnosis not present

## 2021-01-22 DIAGNOSIS — C7951 Secondary malignant neoplasm of bone: Secondary | ICD-10-CM | POA: Diagnosis not present

## 2021-01-22 DIAGNOSIS — Z79899 Other long term (current) drug therapy: Secondary | ICD-10-CM | POA: Diagnosis not present

## 2021-01-22 DIAGNOSIS — C50412 Malignant neoplasm of upper-outer quadrant of left female breast: Secondary | ICD-10-CM | POA: Diagnosis not present

## 2021-01-22 DIAGNOSIS — M85851 Other specified disorders of bone density and structure, right thigh: Secondary | ICD-10-CM

## 2021-01-22 MED ORDER — FULVESTRANT 250 MG/5ML IM SOLN
500.0000 mg | Freq: Once | INTRAMUSCULAR | Status: AC
  Administered 2021-01-22: 500 mg via INTRAMUSCULAR
  Filled 2021-01-22: qty 10

## 2021-01-23 ENCOUNTER — Other Ambulatory Visit: Payer: Self-pay | Admitting: Internal Medicine

## 2021-01-23 DIAGNOSIS — I10 Essential (primary) hypertension: Secondary | ICD-10-CM

## 2021-01-24 NOTE — Telephone Encounter (Signed)
Informed patient that she needs a physical for July she said she will call back to schedule the appointment.

## 2021-02-15 ENCOUNTER — Other Ambulatory Visit: Payer: Self-pay

## 2021-02-15 DIAGNOSIS — C50919 Malignant neoplasm of unspecified site of unspecified female breast: Secondary | ICD-10-CM

## 2021-02-15 NOTE — Progress Notes (Signed)
[facility]  [address], [address] [place], [place] [address] Phone: [phone]  Fax: [phone]   Clinic Day:  02/19/2021  Referring physician: Glean Hess, MD  Chief Complaint: Kelli Williams is a 87 y.o. female with metastatic breast cancer who is seen for 3 month assessment and continuation of monthly Faslodex.    HPI: The patient was last seen in the medical onoclogy clinic on 11/27/2020. At that time, she had been "about the same." Her back, hip, and knee pain were stable.  Exam was stable. Hematocrit was 36.6, hemoglobin 12.4, platelets 209,000, WBC 8,300. Creatinine was 1.03 (CrCl 51 ml/min). She continued calcium and vitamin D.  She received Faslodex monthly x 3 (11/27/2020 - 01/22/2021).   During the interim, she has been fair. She is very fatigued and falls asleep easily. She fell last week while walking with her walker and hit the back of her head. She was not evaluated. She declines a head CT. She has back pain, hip pain, and knee pain.  She lives at a nursing facility. She does her laundry and takes care of plants.   Past Medical History:  Diagnosis Date  . Anxiety   . Arthritis    BOTH FEET AND KNEES  . Breast cancer ([place]) 2000   left breast ca with lumpectomy and rad tx 5 yr tamoxifen/Dr Jeannine Kitten at [facility]  . Complication of anesthesia    WOKE UP DURING SURGERY FOR DEVIATED SEPTUM, KNEE REPLACEMENT AND BREAST LUMPECTOMY  . Dysrhythmia   . GERD (gastroesophageal reflux disease)   . Hypertension   . Neuropathic pain of both legs   . Neuropathy   . Personal history of chemotherapy   . Personal history of radiation therapy   . Pneumonia 2016  . Skin cancer of forehead   . Skin cancer of nose     Past Surgical History:  Procedure Laterality Date  . ABDOMINAL HYSTERECTOMY    . AXILLARY LYMPH NODE BIOPSY Left 07/10/2017   INVASIVE MAMMARY CARCINOMA WITH FOCAL LOBULAR FEATURES.   Marland Kitchen BREAST BIOPSY Bilateral 1960   benign  .  BREAST LUMPECTOMY Left 2000   breast ca with rad tx  . BREAST LUMPECTOMY Left 08/20/2017  . BREAST LUMPECTOMY Left 08/20/2017   Procedure: left breast wide excision;  Surgeon: Robert Bellow, MD;  Location: [facility] ORS;  Service: General;  Laterality: Left;  . CATARACT EXTRACTION     left eye  . CHOLECYSTECTOMY    . JOINT REPLACEMENT    . NASAL SEPTUM SURGERY    . PARTIAL KNEE ARTHROPLASTY     right  . TOTAL VAGINAL HYSTERECTOMY      Family History  Problem Relation Age of Onset  . Heart disease Mother   . Heart attack Paternal Uncle   . Breast cancer Daughter 14       genetic negative  . Cancer Paternal Grandmother     Social History:  reports that she has never smoked. She has never used smokeless tobacco. She reports that she does not drink alcohol and does not use drugs. The patient's husband died 2 years ago.She was born outside of [place].She previously worked as an Web designer. She lives at the [facility]. She is independent except for the use of her walker. Patient has a daughter, Marilynn Ekstein. The patient is alone today.   Allergies:  Allergies  Allergen Reactions  . Amitriptyline Hives  . Amoxicillin     Diarrhea    .  Black Pepper [[place]]   . Ivp Dye [Iodinated Diagnostic Agents]   . Tape Other (See Comments)    [place]helps - please use paper tape.  . Sulfa Antibiotics Rash  . Sulfa Antibiotics Rash    Current Medications: Current Outpatient Medications  Medication Sig Dispense Refill  . acetaminophen (TYLENOL) 325 MG tablet Take 650 mg by mouth every 6 (six) hours as needed.    Marland Kitchen aspirin 81 MG tablet Take 1 tablet by mouth daily.    . Calcium-Magnesium 500-250 MG TABS Take 1 tablet by mouth daily.     . Cholecalciferol (VITAMIN D3) 25 MCG (1000 UT) CAPS Take 1 capsule by mouth daily.     . Coenzyme Q10 (CO Q-10) 100 MG CAPS Take 1 tablet by mouth daily.     Marland Kitchen ezetimibe (ZETIA) 10 MG tablet TAKE 1 TABLET BY MOUTH  DAILY 90 tablet 3   . gabapentin (NEURONTIN) 300 MG capsule TAKE 3 CAPSULES BY MOUTH  TWICE DAILY 540 capsule 3  . Liniments (ACE PAIN RELIEVING PATCH EX) Apply topically. Ameo    . lisinopril-hydrochlorothiazide (ZESTORETIC) 20-25 MG tablet TAKE 1 AND 1/2 TABLETS BY  MOUTH DAILY 135 tablet 1  . meloxicam (MOBIC) 15 MG tablet TAKE 1 TABLET BY MOUTH  DAILY 90 tablet 3  . Multiple Vitamins-Minerals (MULTIVITAMIN WITH MINERALS) tablet Take 1 tablet by mouth daily.    Marland Kitchen omeprazole (PRILOSEC) 20 MG capsule TAKE 1 CAPSULE BY MOUTH  DAILY 90 capsule 3  . traMADol (ULTRAM) 50 MG tablet TAKE 1/2 TO 1 TABLET BY  MOUTH EVERY 8 HOURS AS  NEEDED FOR PAIN (Patient taking differently: TAKE 1/2 TO 1 TABLET BY  MOUTH EVERY 8 HOURS AS  NEEDED FOR PAIN) 90 tablet 0  . clindamycin (CLEOCIN) 300 MG capsule Take 300 mg by mouth once. Prior to dental appointment (Patient not taking: Reported on 02/19/2021)    . Sodium Fluoride (CLINPRO 5000) 1.1 % PSTE Place 1 application onto teeth 2 (two) times daily. (Patient not taking: Reported on 02/19/2021) 112 g 0   No current facility-administered medications for this visit.    Review of Systems  Constitutional: Positive for malaise/fatigue and weight loss (5 lbs). Negative for chills, diaphoresis and fever.  HENT: Negative.  Negative for congestion, ear discharge, ear pain, hearing loss, nosebleeds, sinus pain, sore throat and tinnitus.   Eyes: Negative.  Negative for blurred vision, double vision, photophobia and pain.  Respiratory: Negative.  Negative for cough, hemoptysis, sputum production and shortness of breath.   Cardiovascular: Negative.  Negative for chest pain, palpitations, leg swelling and PND.  Gastrointestinal: Negative.  Negative for abdominal pain, blood in stool, constipation, diarrhea, heartburn, melena, nausea and vomiting.  Genitourinary: Negative.  Negative for dysuria, frequency, hematuria and urgency.  Musculoskeletal: Positive for back pain (chronic lower back), falls  and joint pain (hips and knees). Negative for myalgias and neck pain.  Skin: Negative.  Negative for itching and rash.  Neurological: Positive for sensory change (stable neuropathy on gabapentin). Negative for dizziness, tingling, weakness and headaches.       Chronic balance issues.  Endo/Heme/Allergies: Negative.  Negative for environmental allergies. Does not bruise/bleed easily.  Psychiatric/Behavioral: Negative.  Negative for depression and memory loss. The patient is not nervous/anxious and does not have insomnia.   All other systems reviewed and are negative.  Performance status (ECOG): 2  Vitals Blood pressure (!) 164/72, pulse 60, temperature 97.9 F (36.6 C), temperature source Tympanic, resp. rate 18, weight 190 lb 7.6  oz (86.4 kg), SpO2 97 %.   Physical Exam Vitals and nursing note reviewed.  Constitutional:      General: She is not in acute distress.    Appearance: Normal appearance. She is well-developed. She is not diaphoretic.  HENT:     Head: Normocephalic and atraumatic.     Comments: Gray hair.    Mouth/Throat:     Mouth: Mucous membranes are moist.     Pharynx: Oropharynx is clear. No oropharyngeal exudate.  Eyes:     General: No scleral icterus.       Right eye: No discharge.        Left eye: No discharge.     Extraocular Movements: Extraocular movements intact.     Pupils: Pupils are equal, round, and reactive to light.     Comments: Glasses.  Neck:     Vascular: No JVD.  Cardiovascular:     Rate and Rhythm: Normal rate and regular rhythm.     Heart sounds: Normal heart sounds. No murmur heard. No friction rub. No gallop.   Pulmonary:     Effort: Pulmonary effort is normal. No respiratory distress.     Breath sounds: Normal breath sounds. No wheezing or rales.  Chest:  Breasts:     Right: No swelling, bleeding, mass, skin change, tenderness, axillary adenopathy or supraclavicular adenopathy.     Left: Tenderness (laterally and in left axilla) present.  No swelling, bleeding, mass, skin change, axillary adenopathy or supraclavicular adenopathy.    Abdominal:     General: Bowel sounds are normal. There is no distension.     Palpations: Abdomen is soft. There is no mass.     Tenderness: There is no abdominal tenderness. There is no guarding or rebound.  Musculoskeletal:        General: Tenderness (lumbar spine, BLE) present. No swelling. Normal range of motion.     Cervical back: Normal range of motion and neck supple.     Right lower leg: Edema (chronic) present.     Left lower leg: Edema (chronic) present.  Lymphadenopathy:     Head:     Right side of head: No preauricular, posterior auricular or occipital adenopathy.     Left side of head: No preauricular, posterior auricular or occipital adenopathy.     Cervical: No cervical adenopathy.     Upper Body:     Right upper body: No supraclavicular or axillary adenopathy.     Left upper body: No supraclavicular or axillary adenopathy.     Lower Body: No right inguinal adenopathy. No left inguinal adenopathy.  Skin:    General: Skin is warm and dry.     Coloration: Skin is not pale.     Findings: No erythema or rash.  Neurological:     Mental Status: She is alert and oriented to person, place, and time.  Psychiatric:        Behavior: Behavior normal.        Thought Content: Thought content normal.        Judgment: Judgment normal.    Imaging studies: 09/16/2017: PET scanrevealed widespread scattered hypermetabolic lytic osseous metastasesthroughout the axial and proximal appendicular skeleton. Bilateral proximal femoral skeletal metastases placed the patient at risk for pathologic hip fractures. There was mild hypermetabolic right hilar lymphadenopathy.  09/23/2017: Plain films of the bilateral femursrevealed no pathologic fracture. The LEFT view showed lytic subtrochanteric metastasis that was previously seen on PET scan.  06/01/2018: PET scandemonstrated marked  improvementin the osseous metastatic  disease. Previously demonstrated areas of abnormally high metabolic activity have resolved, with the exception of a very faint area of residual activity (SUV 2.6; previously 11.5) in the LEFT subtrochanteric region. Previous LEFT axillary fluid collection now shows a 1.8 x 1.2 cm scarlike density with low-grade activity (SUV 2.7). Incidental findings include a small hiatal hernia and diverticulosis of the sigmoid colon.  02/11/2018: Lumbar spine MRI revealed multifocal lumbosacral vertebral body signal abnormalities consistent with osseous metastatic disease. The largest lesion was at L1. There was no pathologic compression fracture. There was multi-level moderate to severe neural foraminal stenosis, which had progressed since the prior study. 09/26/2018: Sacral MRI revealed scattered small marrow lesionsc/wmetastatic disease.The0.6 cm lesion in the S1 vertebral bodywasnew since the comparison lumbar spine MRI. Small lesion in the left ilium adjacent to the SI joint was present on the prior MRI. The remaining lesions werenot included on the prior MRI. There was nofracture or other acute abnormality.There was lower lumbar spondylosisand diverticulosis. 10/15/2018: PET scanrevealed no evidence of active breast cancer metastasis on FDG PET scan. There were stable lytic lesions in the spine without evidence of metabolic activity consistent treated metastasis. 05/20/2019:PET scanrevealed no evidence of breast cancer recurrence or progression.There were stable lytic lesions in the spine without associated metabolic activity.CT datawasdegraded by patient motion. 09/16/2019:  Bone density revealed osteopenia with a T-score of -1.9 at the forearm radius.  11/19/2019:  Bone scan revealed no signs of uptake to suggest metastatic disease. Some areas of increased uptake in the cervical and lumbar spine were more compatible with degenerative changes based on  comparison with previous CT evaluation. 11/21/2020:  PET scan on revealed no evidence of breast cancer recurrence or progression. There was no visceral metastasis or nodal metastasis. The lucent lesions within the lower thoracic spine and lumbar spine without metabolic activity were c/w treated metastasis.   Appointment on 02/19/2021  Component Date Value Ref Range Status  . Sodium 02/19/2021 136  135 - 145 mmol/L Final  . Potassium 02/19/2021 3.7  3.5 - 5.1 mmol/L Final  . Chloride 02/19/2021 97* 98 - 111 mmol/L Final  . CO2 02/19/2021 28  22 - 32 mmol/L Final  . Glucose, Bld 02/19/2021 93  70 - 99 mg/dL Final   Glucose reference range applies only to samples taken after fasting for at least 8 hours.  . BUN 02/19/2021 25* 8 - 23 mg/dL Final  . Creatinine, Ser 02/19/2021 1.02* 0.44 - 1.00 mg/dL Final  . Calcium 02/19/2021 9.8  8.9 - 10.3 mg/dL Final  . Total Protein 02/19/2021 7.1  6.5 - 8.1 g/dL Final  . Albumin 02/19/2021 3.8  3.5 - 5.0 g/dL Final  . AST 02/19/2021 17  15 - 41 U/L Final  . ALT 02/19/2021 10  0 - 44 U/L Final  . Alkaline Phosphatase 02/19/2021 71  38 - 126 U/L Final  . Total Bilirubin 02/19/2021 0.6  0.3 - 1.2 mg/dL Final  . GFR, Estimated 02/19/2021 51* >60 mL/min Final   Comment: (NOTE) Calculated using the CKD-EPI Creatinine Equation (2021)   . Anion gap 02/19/2021 11  5 - 15 Final   Performed at [facility], [address] [place], [place] [address]  . WBC 02/19/2021 7.2  4.0 - 10.5 K/uL Final  . RBC 02/19/2021 4.31  3.87 - 5.11 MIL/uL Final  . Hemoglobin 02/19/2021 12.6  12.0 - 15.0 g/dL Final  . HCT 02/19/2021 37.8  36.0 - 46.0 % Final  . MCV 02/19/2021 87.7  80.0 -  100.0 fL Final  . MCH 02/19/2021 29.2  26.0 - 34.0 pg Final  . MCHC 02/19/2021 33.3  30.0 - 36.0 g/dL Final  . RDW 02/19/2021 13.8  11.5 - 15.5 % Final  . Platelets 02/19/2021 224  150 - 400 K/uL Final  . nRBC 02/19/2021 0.0  0.0 - 0.2 % Final  . Neutrophils Relative % 02/19/2021  70  % Final  . Neutro Abs 02/19/2021 5.0  1.7 - 7.7 K/uL Final  . Lymphocytes Relative 02/19/2021 17  % Final  . Lymphs Abs 02/19/2021 1.2  0.7 - 4.0 K/uL Final  . Monocytes Relative 02/19/2021 8  % Final  . Monocytes Absolute 02/19/2021 0.6  0.1 - 1.0 K/uL Final  . Eosinophils Relative 02/19/2021 4  % Final  . Eosinophils Absolute 02/19/2021 0.3  0.0 - 0.5 K/uL Final  . Basophils Relative 02/19/2021 1  % Final  . Basophils Absolute 02/19/2021 0.1  0.0 - 0.1 K/uL Final  . Immature Granulocytes 02/19/2021 0  % Final  . Abs Immature Granulocytes 02/19/2021 0.02  0.00 - 0.07 K/uL Final   Performed at [facility], [address] [place], [place] [address]    Assessment:  Kelli Williams is a 87 y.o. female with metastatic breast cancer s/p left axillary biopsy on 07/10/2017. Pathology revealed a 1.1 cm grade II invasive mammary carcinoma with focal lobular features. Wide excisionon 08/20/2017 revealed metastatic mammary carcinoma involving 1 of 4 axillary lymph nodes with extracapsular extension. The superior medial margin was involved. Tumor was ER + (>90%), PR + (>90%), and Her2/neu 2+ (FISH -).   [facility] genetic testingon 09/26/2017 revealed no pathogenic sequence variants or deletions/duplications. BRCA1/2 testing on 09/26/2017 was negative.  She has a history of left breast cancers/p wide excisionand sentinel lymph node biopsy on 07/18/1999 at [facility]. Pathology revealed a 0.8 x 0.6 x 0.4 cm grade II invasive ductal adenocarcinoma. There was no lymphovascular invasion. 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 Margins were negative. Tumor was ER + (50%) and PR + (90%). Pathologic stagewas T1bN0. She received 6200 cGyfrom 08/21/1999 - 10/03/1999. She completed 5 years of tamoxifenin 06/2004.   PET scanon 09/16/2017 revealed widespread scattered hypermetabolic lytic osseous metastases throughout the axial and proximal appendicular skeleton.  Bilateral proximal femoral skeletal metastases placed the patient at risk for pathologic hip fractures. There was mild hypermetabolic right hilar lymphadenopathy. Plain films of the bilateral femurson 09/23/2017 revealed no pathologic fracture. The LEFT view showed lytic subtrochanteric metastasis that was previously seen on PET scan.   PET scan on 11/21/2020 revealed no evidence of breast cancer recurrence or progression. There was no visceral metastasis or nodal metastasis. The lucent lesions within the lower thoracic spine and lumbar spine without metabolic activity were c/w treated metastasis.  CA27.29has been followed: 14.7 on 02/05/2017, 19.1 on 02/06/2012, 172.2 on 09/04/2017, 144.9 on 11/06/2017, 73.4 on 02/05/2018, 63.3 on 03/06/2018, 47.2 on 04/06/2018, 42.4 on 05/07/2018, 36.6 on 06/08/2018, 34.6 on 07/06/2018, 27.1 on 08/06/2018, 28.2 on 09/10/2018, 26.0 on 10/09/2018, 30.9 on 11/09/2018, 27.7 on 12/07/2018, 35.8 on 01/04/2019, 24.8 on 02/01/2019, 24.2 on 04/26/2019, 21.6 on 05/24/2019, 24.5 on 07/19/2019, 20.6 on 10/12/2019, 18.5 on 11/10/2019, 20.0 on 02/02/2020, 20.1 on 04/26/2020, 16.6 on 07/27/2020, 16.5 on 10/30/2020, and 21.2 on 02/19/2021.  She received a palliative course of radiation(3000 cGy in 10 fractions) to the left hip from 10/14/2017 - 10/27/2017. She received a course of palliative radiationthe the lumbar spine and SI joints from 03/04/2018 - 03/27/2018.  She began Faslodexon 09/26/2017 (last 01/22/2021). She began monthly Xgevaon 09/26/2017 (last 05/07/2018). She has dental issues.  Bone densityon 09/01/2017 revealed osteopeniawith a T-score of -1.9 in the right femoral neck and left forearm. Bone density on 09/16/2019 revealed osteopenia with a T-score of -1.9 at the forearm radius.  She is on calcium and vitamin D.  She received both doses of the [place] COVID-19 vaccine in 03/2020.   Symptomatically, she has been fair. She fell last week while  walking with her walker and hit the back of her head. She declines a head CT. She has chronic back, hip, and knee pain.  Plan: 1.   Labs today: CBC with diff CMP, CA27.29, TSH. 2.Metastatic breast cancer Clinically, she is doing fair. Exam reveals no evidence of recurrent disease. CA 27.29 is 21.2 (normal) on today. PET scan on12/14/2021 revealed no evidence of breast cancer recurrence or progression. Labs reviewed.  Faslodex today. 3.Bone metastasis Patient has chronic back, hip and knee pain.  She has known degenerative changes. She has no evidence of metastatic disease on PET scan from 11/21/2020.  There was no evidence of bone metastasis on 11/19/2019. Continue to monitor. 4.Osteopenia She is on calcium and vitamin D. Patient to consider Prolia. 5.   Back pain s/p fall  Plain films of back. 6.   Faslodex today. 7.   RTC monthly x 2 for Faslodex. 8.   RTC in 3 months for MD assessment, labs (CBC with diff CMP, CA27.29), and Faslodex.  Addendum:  Thoracic and lumbar spine plain films today revealed no evidence of acute or healing fracture of the thoracic spine.  There was mild diffuse degenerative disc disease  I discussed the assessment and treatment plan with the patient.  The patient was provided an opportunity to ask questions and all were answered.  The patient agreed with the plan and demonstrated an understanding of the instructions.  The patient was advised to call back if the symptoms worsen or if the condition fails to improve as anticipated.  I provided 18 minutes of face-to-face time during this this encounter and > 50% was spent counseling as documented under my assessment and plan. An additional 10 minutes were spent reviewing her chart (Epic and Care Everywhere) including notes, labs, and imaging studies.    Lequita Asal, MD, PhD    02/19/2021, 1:53 PM  I, Evert Kohl, am acting as a Education administrator for Lequita Asal, MD.  I, [place]  Mike Gip, MD, have reviewed the above documentation for accuracy and completeness, and I agree with the above.

## 2021-02-19 ENCOUNTER — Ambulatory Visit
Admission: RE | Admit: 2021-02-19 | Discharge: 2021-02-19 | Disposition: A | Discharge status: Home / Self Care | Payer: Medicare Other | Attending: Hematology and Oncology | Admitting: Hematology and Oncology

## 2021-02-19 ENCOUNTER — Other Ambulatory Visit: Payer: Self-pay

## 2021-02-19 ENCOUNTER — Inpatient Hospital Stay: Payer: Medicare Other

## 2021-02-19 ENCOUNTER — Inpatient Hospital Stay: Payer: Medicare Other | Attending: Hematology and Oncology | Admitting: Hematology and Oncology

## 2021-02-19 ENCOUNTER — Ambulatory Visit
Admission: RE | Admit: 2021-02-19 | Discharge: 2021-02-19 | Disposition: A | Discharge status: Home / Self Care | Payer: Medicare Other | Source: Ambulatory Visit | Attending: Hematology and Oncology | Admitting: Hematology and Oncology

## 2021-02-19 ENCOUNTER — Encounter: Payer: Self-pay | Admitting: Hematology and Oncology

## 2021-02-19 ENCOUNTER — Other Ambulatory Visit: Payer: Self-pay | Admitting: Hematology and Oncology

## 2021-02-19 VITALS — BP 164/72 | HR 60 | Temp 97.9°F | Resp 18 | Wt 190.5 lb

## 2021-02-19 DIAGNOSIS — M419 Scoliosis, unspecified: Secondary | ICD-10-CM | POA: Insufficient documentation

## 2021-02-19 DIAGNOSIS — Z5111 Encounter for antineoplastic chemotherapy: Secondary | ICD-10-CM | POA: Insufficient documentation

## 2021-02-19 DIAGNOSIS — M545 Low back pain, unspecified: Secondary | ICD-10-CM | POA: Diagnosis not present

## 2021-02-19 DIAGNOSIS — M5136 Other intervertebral disc degeneration, lumbar region: Secondary | ICD-10-CM | POA: Diagnosis not present

## 2021-02-19 DIAGNOSIS — M47819 Spondylosis without myelopathy or radiculopathy, site unspecified: Secondary | ICD-10-CM | POA: Insufficient documentation

## 2021-02-19 DIAGNOSIS — Z79899 Other long term (current) drug therapy: Secondary | ICD-10-CM | POA: Diagnosis not present

## 2021-02-19 DIAGNOSIS — C7951 Secondary malignant neoplasm of bone: Secondary | ICD-10-CM | POA: Diagnosis not present

## 2021-02-19 DIAGNOSIS — C50412 Malignant neoplasm of upper-outer quadrant of left female breast: Secondary | ICD-10-CM | POA: Diagnosis not present

## 2021-02-19 DIAGNOSIS — M549 Dorsalgia, unspecified: Secondary | ICD-10-CM

## 2021-02-19 DIAGNOSIS — Z170 Estrogen receptor positive status [ER+]: Secondary | ICD-10-CM

## 2021-02-19 DIAGNOSIS — R7989 Other specified abnormal findings of blood chemistry: Secondary | ICD-10-CM | POA: Insufficient documentation

## 2021-02-19 DIAGNOSIS — Z853 Personal history of malignant neoplasm of breast: Secondary | ICD-10-CM | POA: Insufficient documentation

## 2021-02-19 DIAGNOSIS — M85851 Other specified disorders of bone density and structure, right thigh: Secondary | ICD-10-CM | POA: Diagnosis not present

## 2021-02-19 DIAGNOSIS — M546 Pain in thoracic spine: Secondary | ICD-10-CM | POA: Diagnosis not present

## 2021-02-19 DIAGNOSIS — C50919 Malignant neoplasm of unspecified site of unspecified female breast: Secondary | ICD-10-CM

## 2021-02-19 LAB — COMPREHENSIVE METABOLIC PANEL
ALT: 10 U/L (ref 0–44)
AST: 17 U/L (ref 15–41)
Albumin: 3.8 g/dL (ref 3.5–5.0)
Alkaline Phosphatase: 71 U/L (ref 38–126)
Anion gap: 11 (ref 5–15)
BUN: 25 mg/dL — ABNORMAL HIGH (ref 8–23)
CO2: 28 mmol/L (ref 22–32)
Calcium: 9.8 mg/dL (ref 8.9–10.3)
Chloride: 97 mmol/L — ABNORMAL LOW (ref 98–111)
Creatinine, Ser: 1.02 mg/dL — ABNORMAL HIGH (ref 0.44–1.00)
GFR, Estimated: 51 mL/min — ABNORMAL LOW (ref >60)
Glucose, Bld: 93 mg/dL (ref 70–99)
Potassium: 3.7 mmol/L (ref 3.5–5.1)
Sodium: 136 mmol/L (ref 135–145)
Total Bilirubin: 0.6 mg/dL (ref 0.3–1.2)
Total Protein: 7.1 g/dL (ref 6.5–8.1)

## 2021-02-19 LAB — CBC WITH DIFFERENTIAL/PLATELET
Abs Immature Granulocytes: 0.02 10*3/uL (ref 0.00–0.07)
Basophils Absolute: 0.1 10*3/uL (ref 0.0–0.1)
Basophils Relative: 1 %
Eosinophils Absolute: 0.3 10*3/uL (ref 0.0–0.5)
Eosinophils Relative: 4 %
HCT: 37.8 % (ref 36.0–46.0)
Hemoglobin: 12.6 g/dL (ref 12.0–15.0)
Immature Granulocytes: 0 %
Lymphocytes Relative: 17 %
Lymphs Abs: 1.2 10*3/uL (ref 0.7–4.0)
MCH: 29.2 pg (ref 26.0–34.0)
MCHC: 33.3 g/dL (ref 30.0–36.0)
MCV: 87.7 fL (ref 80.0–100.0)
Monocytes Absolute: 0.6 10*3/uL (ref 0.1–1.0)
Monocytes Relative: 8 %
Neutro Abs: 5 10*3/uL (ref 1.7–7.7)
Neutrophils Relative %: 70 %
Platelets: 224 10*3/uL (ref 150–400)
RBC: 4.31 MIL/uL (ref 3.87–5.11)
RDW: 13.8 % (ref 11.5–15.5)
WBC: 7.2 10*3/uL (ref 4.0–10.5)
nRBC: 0 % (ref 0.0–0.2)

## 2021-02-19 LAB — TSH: TSH: 1.698 u[IU]/mL (ref 0.350–4.500)

## 2021-02-19 MED ORDER — FULVESTRANT 250 MG/5ML IM SOLN
500.0000 mg | Freq: Once | INTRAMUSCULAR | Status: AC
  Administered 2021-02-19: 500 mg via INTRAMUSCULAR

## 2021-02-19 NOTE — Progress Notes (Signed)
Patient here for oncology follow-up appointment, expresses concerns of fatigue, lightheadness and  Shortness of breath with exertion.

## 2021-02-20 LAB — CANCER ANTIGEN 27.29: CA 27.29: 21.2 U/mL (ref 0.0–38.6)

## 2021-02-28 DIAGNOSIS — H2511 Age-related nuclear cataract, right eye: Secondary | ICD-10-CM | POA: Diagnosis not present

## 2021-03-12 DIAGNOSIS — M549 Dorsalgia, unspecified: Secondary | ICD-10-CM | POA: Insufficient documentation

## 2021-03-22 ENCOUNTER — Inpatient Hospital Stay: Payer: Medicare Other | Attending: Hematology and Oncology

## 2021-03-22 DIAGNOSIS — M85851 Other specified disorders of bone density and structure, right thigh: Secondary | ICD-10-CM

## 2021-03-22 DIAGNOSIS — C50412 Malignant neoplasm of upper-outer quadrant of left female breast: Secondary | ICD-10-CM | POA: Diagnosis not present

## 2021-03-22 DIAGNOSIS — C7951 Secondary malignant neoplasm of bone: Secondary | ICD-10-CM | POA: Diagnosis not present

## 2021-03-22 DIAGNOSIS — Z5111 Encounter for antineoplastic chemotherapy: Secondary | ICD-10-CM | POA: Insufficient documentation

## 2021-03-22 DIAGNOSIS — Z79899 Other long term (current) drug therapy: Secondary | ICD-10-CM | POA: Insufficient documentation

## 2021-03-22 MED ORDER — FULVESTRANT 250 MG/5ML IM SOLN
500.0000 mg | Freq: Once | INTRAMUSCULAR | Status: AC
  Administered 2021-03-22: 500 mg via INTRAMUSCULAR
  Filled 2021-03-22: qty 10

## 2021-04-10 ENCOUNTER — Ambulatory Visit: Payer: Self-pay

## 2021-04-10 NOTE — Telephone Encounter (Signed)
Pt. Reports she fell at home last week. Fell on her left knee. Knee is bruised - purple and green. Can walk but does have pain. Appointment made for tomorrow. Reason for Disposition . [1] High-risk adult (e.g., age > 58 years, osteoporosis, chronic steroid use) AND [2] limping  Answer Assessment - Initial Assessment Questions 1. MECHANISM: "How did the injury happen?" (e.g., twisting injury, direct blow)      Fell 2. ONSET: "When did the injury happen?" (Minutes or hours ago)      Last week- fell at home 3. LOCATION: "Where is the injury located?"      Left knee 4. APPEARANCE of INJURY: "What does the injury look like?"      Purple 5. SEVERITY: "Can you put weight on that leg?" "Can you walk?"      Yes 6. SIZE: For cuts, bruises, or swelling, ask: "How large is it?" (e.g., inches or centimeters;  entire joint)      No cuts 7. PAIN: "Is there pain?" If Yes, ask: "How bad is the pain?"  "What does it keep you from doing?" (e.g., Scale 1-10; or mild, moderate, severe)   -  NONE: (0): no pain   -  MILD (1-3): doesn't interfere with normal activities    -  MODERATE (4-7): interferes with normal activities (e.g., work or school) or awakens from sleep, limping    -  SEVERE (8-10): excruciating pain, unable to do any normal activities, unable to walk     Stiff - 7 8. TETANUS: For any breaks in the skin, ask: "When was the last tetanus booster?"     No 9. OTHER SYMPTOMS: "Do you have any other symptoms?"  (e.g., "pop" when knee injured, swelling, locking, buckling)      No 10. PREGNANCY: "Is there any chance you are pregnant?" "When was your last menstrual period?"       No  Protocols used: KNEE INJURY-A-AH

## 2021-04-11 ENCOUNTER — Ambulatory Visit
Admission: RE | Admit: 2021-04-11 | Discharge: 2021-04-11 | Disposition: A | Discharge status: Home / Self Care | Payer: Medicare Other | Attending: Internal Medicine | Admitting: Internal Medicine

## 2021-04-11 ENCOUNTER — Other Ambulatory Visit: Payer: Self-pay

## 2021-04-11 ENCOUNTER — Ambulatory Visit (INDEPENDENT_AMBULATORY_CARE_PROVIDER_SITE_OTHER): Payer: Medicare Other | Admitting: Internal Medicine

## 2021-04-11 ENCOUNTER — Encounter: Payer: Self-pay | Admitting: Internal Medicine

## 2021-04-11 ENCOUNTER — Ambulatory Visit
Admission: RE | Admit: 2021-04-11 | Discharge: 2021-04-11 | Disposition: A | Discharge status: Home / Self Care | Payer: Medicare Other | Source: Ambulatory Visit | Attending: Internal Medicine | Admitting: Internal Medicine

## 2021-04-11 VITALS — BP 136/84 | HR 66 | Ht 64.0 in | Wt 194.0 lb

## 2021-04-11 DIAGNOSIS — M25469 Effusion, unspecified knee: Secondary | ICD-10-CM

## 2021-04-11 DIAGNOSIS — M25562 Pain in left knee: Secondary | ICD-10-CM | POA: Diagnosis not present

## 2021-04-11 NOTE — Progress Notes (Signed)
Date:  04/11/2021   Name:  Kelli Williams   DOB:  1926-01-20   MRN:  [id number]   Chief Complaint: [place] [address] 1 week ago. And Left knee hit her walked. Bruised from left hip to left knee.)  Fall The accident occurred 5 to 7 days ago. The fall occurred while standing. She landed on carpet. The point of impact was the left knee and left hip. The pain is present in the left knee. The pain is mild. Pertinent negatives include no fever or headaches.  She feels like her left knee/leg gives way at times and this is what caused her fall last week.  She denies worsening pain with ambulation.  Her knee and leg is swollen, bruised and tender to touch.  Lab Results  Component Value Date   CREATININE 1.02 (H) 02/19/2021   BUN 25 (H) 02/19/2021   NA 136 02/19/2021   K 3.7 02/19/2021   CL 97 (L) 02/19/2021   CO2 28 02/19/2021   Lab Results  Component Value Date   CHOL 164 10/23/2020   HDL 60 10/23/2020   LDLCALC 80 10/23/2020   TRIG 142 10/23/2020   CHOLHDL 2.7 10/23/2020   Lab Results  Component Value Date   TSH 1.698 02/19/2021   No results found for: HGBA1C Lab Results  Component Value Date   WBC 7.2 02/19/2021   HGB 12.6 02/19/2021   HCT 37.8 02/19/2021   MCV 87.7 02/19/2021   PLT 224 02/19/2021   Lab Results  Component Value Date   ALT 10 02/19/2021   AST 17 02/19/2021   ALKPHOS 71 02/19/2021   BILITOT 0.6 02/19/2021     Review of Systems  Constitutional: Negative for chills, fatigue and fever.  Eyes: Negative for visual disturbance.  Respiratory: Negative for cough, chest tightness, shortness of breath and wheezing.   Cardiovascular: Positive for leg swelling. Negative for chest pain.  Musculoskeletal: Positive for arthralgias, back pain, gait problem and joint swelling.  Neurological: Negative for dizziness, light-headedness and headaches.    Patient Active Problem List   Diagnosis Date Noted  . Acute midline back pain 03/12/2021  . Lower extremity neuropathy  04/25/2020  . Dental disease 10/13/2019  . Lumbar pain 04/26/2019  . Dental cavity 08/10/2018  . PAD (peripheral artery disease) ([place]) 07/01/2018  . Aortic atherosclerosis ([place]) 07/01/2018  . Coronary artery calcification seen on CT scan 07/01/2018  . Cancer associated pain 02/05/2018  . Goals of care, counseling/discussion 11/24/2017  . Candida infection 11/24/2017  . Esophageal reflux 10/28/2017  . Postoperative seroma of subcutaneous tissue after non-dermatologic procedure 10/22/2017  . Bone metastases ([place]) 09/23/2017  . Osteopenia 09/08/2017  . Breast cancer of upper-outer quadrant of left female breast ([place]) 07/10/2017  . Irritable bowel syndrome without diarrhea 06/27/2017  . Oral lesion 06/27/2017  . BMI 33.0-33.9,adult 06/27/2017  . OA (osteoarthritis) of knee 10/16/2016  . Leg swelling 10/11/2015  . Neuropathy involving both lower extremities 06/19/2015  . Secondary localized osteoarthrosis, ankle and foot 06/01/2015  . Acne erythematosa 06/01/2015  . Avitaminosis D 06/01/2015  . Essential hypertension 08/17/2012  . History of breast cancer 08/17/2012    Allergies  Allergen Reactions  . Amitriptyline Hives  . Amoxicillin     Diarrhea    . Black Pepper [[place]]   . Ivp Dye [Iodinated Diagnostic Agents]   . Tape Other (See Comments)    Whelps - please use paper tape.  . Sulfa Antibiotics Rash  . Sulfa Antibiotics Rash  Past Surgical History:  Procedure Laterality Date  . ABDOMINAL HYSTERECTOMY    . AXILLARY LYMPH NODE BIOPSY Left 07/10/2017   INVASIVE MAMMARY CARCINOMA WITH FOCAL LOBULAR FEATURES.   Marland Kitchen BREAST BIOPSY Bilateral 1960   benign  . BREAST LUMPECTOMY Left 2000   breast ca with rad tx  . BREAST LUMPECTOMY Left 08/20/2017  . BREAST LUMPECTOMY Left 08/20/2017   Procedure: left breast wide excision;  Surgeon: Robert Bellow, MD;  Location: [facility] ORS;  Service: General;  Laterality: Left;  . CATARACT EXTRACTION     left eye  . CHOLECYSTECTOMY     . JOINT REPLACEMENT    . NASAL SEPTUM SURGERY    . PARTIAL KNEE ARTHROPLASTY     right  . TOTAL VAGINAL HYSTERECTOMY      Social History   Tobacco Use  . Smoking status: Never Smoker  . Smokeless tobacco: Never Used  . Tobacco comment: smoking cessation materials not required  Vaping Use  . Vaping Use: Never used  Substance Use Topics  . Alcohol use: No    Alcohol/week: 0.0 standard drinks  . Drug use: No     Medication list has been reviewed and updated.  No outpatient medications have been marked as taking for the 04/11/21 encounter (Office Visit) with Glean Hess, MD.    [facility] 2/9 Scores 04/11/2021 10/18/2020 06/21/2020 12/23/2019  PHQ - 2 Score 0 0 0 0  PHQ- 9 Score 0 - 0 2    GAD 7 : Generalized Anxiety Score 04/11/2021 06/21/2020  Nervous, Anxious, on Edge 0 0  Control/stop worrying 0 0  Worry too much - different things 0 0  Trouble relaxing 0 0  Restless 0 0  Easily annoyed or irritable 0 0  Afraid - awful might happen 0 0  Total GAD 7 Score 0 0  Anxiety Difficulty Not difficult at all Not difficult at all    BP Readings from Last 3 Encounters:  04/11/21 136/84  02/19/21 (!) 164/72  11/27/20 122/78    Physical Exam Vitals and nursing note reviewed.  Constitutional:      General: She is not in acute distress.    Appearance: She is well-developed. She is obese.  HENT:     Head: Normocephalic and atraumatic.  Cardiovascular:     Rate and Rhythm: Normal rate and regular rhythm.     Pulses: Normal pulses.  Pulmonary:     Effort: Pulmonary effort is normal. No respiratory distress.     Breath sounds: No wheezing or rhonchi.  Musculoskeletal:        General: Swelling present.     Right shoulder: Normal.     Left shoulder: Normal.     Cervical back: Normal range of motion.     Lumbar back: Bony tenderness present. Abnormal curvature of lumbar spine: from cancer mets.     Right knee: Normal.     Left knee: Swelling and ecchymosis present. Decreased  range of motion. Tenderness present.  Skin:    General: Skin is warm and dry.     Findings: No rash.  Neurological:     Mental Status: She is alert and oriented to person, place, and time.  Psychiatric:        Mood and Affect: Mood normal.        Behavior: Behavior normal.     Wt Readings from Last 3 Encounters:  04/11/21 194 lb (88 kg)  02/19/21 190 lb 7.6 oz (86.4 kg)  11/27/20 195 lb (88.5  kg)    BP 136/84   Pulse 66   Ht 5\' 4"  (1.626 m)   Wt 194 lb (88 kg)   SpO2 96%   BMI 33.30 kg/m   Assessment and Plan: 1. Knee swelling S/p fall last week onto carpet.  Was helped up by staff at the [facility] [place]. Nurse noticed bruising yesterday and recommended she be evaluated. Continue ambulation as tolerated. Take tylenol and use warm compresses. Xray to rule out fracture or dislocation - DG Knee Complete 4 Views Left; Future   Partially dictated using Editor, commissioning. Any errors are unintentional.  Halina Maidens, MD [facility]  04/11/2021

## 2021-04-11 NOTE — Telephone Encounter (Signed)
Noted pt has an appt today 04/11/2021.  KP

## 2021-04-20 ENCOUNTER — Other Ambulatory Visit: Payer: Self-pay | Admitting: Internal Medicine

## 2021-04-20 DIAGNOSIS — G5793 Unspecified mononeuropathy of bilateral lower limbs: Secondary | ICD-10-CM

## 2021-04-20 DIAGNOSIS — K589 Irritable bowel syndrome without diarrhea: Secondary | ICD-10-CM

## 2021-04-23 ENCOUNTER — Telehealth: Payer: Self-pay | Admitting: Oncology

## 2021-04-23 ENCOUNTER — Other Ambulatory Visit: Payer: Self-pay

## 2021-04-23 ENCOUNTER — Inpatient Hospital Stay: Payer: Medicare Other | Attending: Oncology

## 2021-04-23 DIAGNOSIS — Z79899 Other long term (current) drug therapy: Secondary | ICD-10-CM | POA: Insufficient documentation

## 2021-04-23 DIAGNOSIS — M85851 Other specified disorders of bone density and structure, right thigh: Secondary | ICD-10-CM

## 2021-04-23 DIAGNOSIS — C50412 Malignant neoplasm of upper-outer quadrant of left female breast: Secondary | ICD-10-CM | POA: Insufficient documentation

## 2021-04-23 DIAGNOSIS — Z5111 Encounter for antineoplastic chemotherapy: Secondary | ICD-10-CM | POA: Insufficient documentation

## 2021-04-23 DIAGNOSIS — C7951 Secondary malignant neoplasm of bone: Secondary | ICD-10-CM | POA: Insufficient documentation

## 2021-04-23 MED ORDER — FULVESTRANT 250 MG/5ML IM SOLN
500.0000 mg | Freq: Once | INTRAMUSCULAR | Status: AC
  Administered 2021-04-23: 500 mg via INTRAMUSCULAR
  Filled 2021-04-23: qty 10

## 2021-04-23 NOTE — Telephone Encounter (Signed)
Left VM with patient to notify her of appts with Dr. Janese Banks moved from [facility] to [place] (per her request). Left direct # for  Her to return call if there are any questions or concerns. Sending updated AVS in the mail.

## 2021-05-22 ENCOUNTER — Encounter: Payer: Self-pay | Admitting: Oncology

## 2021-05-22 ENCOUNTER — Other Ambulatory Visit: Payer: Self-pay

## 2021-05-22 ENCOUNTER — Inpatient Hospital Stay (HOSPITAL_BASED_OUTPATIENT_CLINIC_OR_DEPARTMENT_OTHER): Payer: Medicare Other | Admitting: Oncology

## 2021-05-22 ENCOUNTER — Other Ambulatory Visit: Payer: Self-pay | Admitting: *Deleted

## 2021-05-22 ENCOUNTER — Inpatient Hospital Stay: Payer: Medicare Other | Attending: Oncology

## 2021-05-22 ENCOUNTER — Inpatient Hospital Stay: Payer: Medicare Other

## 2021-05-22 VITALS — BP 166/85 | HR 69 | Temp 97.8°F | Resp 17 | Ht 64.0 in | Wt 195.4 lb

## 2021-05-22 DIAGNOSIS — Z170 Estrogen receptor positive status [ER+]: Secondary | ICD-10-CM

## 2021-05-22 DIAGNOSIS — Z5111 Encounter for antineoplastic chemotherapy: Secondary | ICD-10-CM | POA: Diagnosis not present

## 2021-05-22 DIAGNOSIS — Z79818 Long term (current) use of other agents affecting estrogen receptors and estrogen levels: Secondary | ICD-10-CM | POA: Diagnosis not present

## 2021-05-22 DIAGNOSIS — C7951 Secondary malignant neoplasm of bone: Secondary | ICD-10-CM | POA: Insufficient documentation

## 2021-05-22 DIAGNOSIS — C50919 Malignant neoplasm of unspecified site of unspecified female breast: Secondary | ICD-10-CM

## 2021-05-22 DIAGNOSIS — C50412 Malignant neoplasm of upper-outer quadrant of left female breast: Secondary | ICD-10-CM

## 2021-05-22 DIAGNOSIS — M85851 Other specified disorders of bone density and structure, right thigh: Secondary | ICD-10-CM

## 2021-05-22 DIAGNOSIS — Z79899 Other long term (current) drug therapy: Secondary | ICD-10-CM | POA: Diagnosis not present

## 2021-05-22 LAB — COMPREHENSIVE METABOLIC PANEL
ALT: 12 U/L (ref 0–44)
AST: 22 U/L (ref 15–41)
Albumin: 4 g/dL (ref 3.5–5.0)
Alkaline Phosphatase: 76 U/L (ref 38–126)
Anion gap: 11 (ref 5–15)
BUN: 27 mg/dL — ABNORMAL HIGH (ref 8–23)
CO2: 29 mmol/L (ref 22–32)
Calcium: 9.6 mg/dL (ref 8.9–10.3)
Chloride: 94 mmol/L — ABNORMAL LOW (ref 98–111)
Creatinine, Ser: 1.01 mg/dL — ABNORMAL HIGH (ref 0.44–1.00)
GFR, Estimated: 52 mL/min — ABNORMAL LOW (ref >60)
Glucose, Bld: 104 mg/dL — ABNORMAL HIGH (ref 70–99)
Potassium: 3.7 mmol/L (ref 3.5–5.1)
Sodium: 134 mmol/L — ABNORMAL LOW (ref 135–145)
Total Bilirubin: 0.5 mg/dL (ref 0.3–1.2)
Total Protein: 7.4 g/dL (ref 6.5–8.1)

## 2021-05-22 LAB — CBC WITH DIFFERENTIAL/PLATELET
Abs Immature Granulocytes: 0.03 10*3/uL (ref 0.00–0.07)
Basophils Absolute: 0 10*3/uL (ref 0.0–0.1)
Basophils Relative: 1 %
Eosinophils Absolute: 0.3 10*3/uL (ref 0.0–0.5)
Eosinophils Relative: 4 %
HCT: 36.2 % (ref 36.0–46.0)
Hemoglobin: 12.2 g/dL (ref 12.0–15.0)
Immature Granulocytes: 1 %
Lymphocytes Relative: 17 %
Lymphs Abs: 1 10*3/uL (ref 0.7–4.0)
MCH: 29.8 pg (ref 26.0–34.0)
MCHC: 33.7 g/dL (ref 30.0–36.0)
MCV: 88.3 fL (ref 80.0–100.0)
Monocytes Absolute: 0.4 10*3/uL (ref 0.1–1.0)
Monocytes Relative: 6 %
Neutro Abs: 4.3 10*3/uL (ref 1.7–7.7)
Neutrophils Relative %: 71 %
Platelets: 207 10*3/uL (ref 150–400)
RBC: 4.1 MIL/uL (ref 3.87–5.11)
RDW: 14.4 % (ref 11.5–15.5)
WBC: 6.1 10*3/uL (ref 4.0–10.5)
nRBC: 0 % (ref 0.0–0.2)

## 2021-05-22 MED ORDER — FULVESTRANT 250 MG/5ML IM SOLN
500.0000 mg | Freq: Once | INTRAMUSCULAR | Status: AC
  Administered 2021-05-22: 500 mg via INTRAMUSCULAR
  Filled 2021-05-22: qty 10

## 2021-05-22 NOTE — Progress Notes (Signed)
Hematology/Oncology Consult note [facility]  [phone] [phone]  Patient Care Team: Reubin Milan, MD as PCP - General (Internal Medicine) Antonieta Iba, MD as Consulting Physician (Cardiology) Ronnette Juniper as Physician Assistant (Orthopedic Surgery) Lemar Livings Merrily Pew, MD (General Surgery) Kennedy Bucker, MD as Consulting Physician (Orthopedic Surgery) Rosey Bath, MD as Consulting Physician (Hematology and Oncology) Creig Hines, MD as Consulting Physician (Hematology and Oncology)   Name of the patient: Kelli Williams  [id number]  Sep 20, 1926   Date of visit: 05/22/21  Diagnosis-metastatic ER positive breast cancer  Chief complaint/ Reason for visit-routine follow-up of breast cancer on Faslodex  Heme/Onc history: [name] who was previously seeing Dr. Merlene Pulling and is now transferring her care to me.  She was diagnosed with left breast cancer in August 2000 and NTU.  It was a 0.8 cm grade 2 invasive mammary carcinoma ER 50% positive PR 90% positive.  She underwent adjuvant radiation treatment and completed 5 years of tamoxifen in July 2005.  She was noted to have metastatic breast cancer on left axillary biopsy in August 2018.  Pathology showed 1.1 cm grade 2 invasive mammary carcinoma with focal lobular features.  1 out of 4 axillary lymph nodes with extracapsular extension.  Tumor greater than 90% ER/PR positive and HER2 negative by FISH.  PET scan also showed widespread hypermetabolic osseous bone lesions she has been on Faslodex since August 2018 with stable disease and most recent PET scan in December 2021 showed no evidence of recurrence or progression.  Patient was also on Xgeva previously which was given between October 2018 but stopped in May 2019 due to dental issues.  Interval history-patient lives at [facility] and is doing well for her age.  Denies any new aches and  pains anywhere.  Appetite and weight have been stable.  She ambulates with a walker.  Has occasional falls.  ECOG PS- 1 Pain scale- 0 Opioid associated constipation- no  Review of systems- Review of Systems  Constitutional:  Positive for malaise/fatigue. Negative for chills, fever and weight loss.  HENT:  Negative for congestion, ear discharge and nosebleeds.   Eyes:  Negative for blurred vision.  Respiratory:  Negative for cough, hemoptysis, sputum production, shortness of breath and wheezing.   Cardiovascular:  Negative for chest pain, palpitations, orthopnea and claudication.  Gastrointestinal:  Negative for abdominal pain, blood in stool, constipation, diarrhea, heartburn, melena, nausea and vomiting.  Genitourinary:  Negative for dysuria, flank pain, frequency, hematuria and urgency.  Musculoskeletal:  Negative for back pain, joint pain and myalgias.  Skin:  Negative for rash.  Neurological:  Negative for dizziness, tingling, focal weakness, seizures, weakness and headaches.  Endo/Heme/Allergies:  Does not bruise/bleed easily.  Psychiatric/Behavioral:  Negative for depression and suicidal ideas. The patient does not have insomnia.      Allergies  Allergen Reactions   Amitriptyline Hives   Amoxicillin     Diarrhea     Black Pepper [[place]]    Ivp Dye [Iodinated Diagnostic Agents]    Tape Other (See Comments)    Whelps - please use paper tape.   Sulfa Antibiotics Rash   Sulfa Antibiotics Rash     Past Medical History:  Diagnosis Date   Anxiety    Arthritis    BOTH FEET AND KNEES   Breast cancer (HCC) 2000   left breast ca with lumpectomy and rad tx 5 yr tamoxifen/Dr Constance Goltz at [facility]  Complication of anesthesia    WOKE UP DURING SURGERY FOR DEVIATED SEPTUM, KNEE REPLACEMENT AND BREAST LUMPECTOMY   Dysrhythmia    GERD (gastroesophageal reflux disease)    Hypertension    Neuropathic pain of both legs    Neuropathy    Personal history of chemotherapy    Personal history  of radiation therapy    Pneumonia 2016   Skin cancer of forehead    Skin cancer of nose      Past Surgical History:  Procedure Laterality Date   ABDOMINAL HYSTERECTOMY     AXILLARY LYMPH NODE BIOPSY Left 07/10/2017   INVASIVE MAMMARY CARCINOMA WITH FOCAL LOBULAR FEATURES.    BREAST BIOPSY Bilateral 1960   benign   BREAST LUMPECTOMY Left 2000   breast ca with rad tx   BREAST LUMPECTOMY Left 08/20/2017   BREAST LUMPECTOMY Left 08/20/2017   Procedure: left breast wide excision;  Surgeon: Robert Bellow, MD;  Location: [facility] ORS;  Service: General;  Laterality: Left;   CATARACT EXTRACTION     left eye   CHOLECYSTECTOMY     JOINT REPLACEMENT     NASAL SEPTUM SURGERY     PARTIAL KNEE ARTHROPLASTY     right   TOTAL VAGINAL HYSTERECTOMY      Social History   Socioeconomic History   Marital status: Widowed    Spouse name: Not on file   Number of children: 3   Years of education: some college   Highest education level: 12th grade  Occupational History   Occupation: Retired  Tobacco Use   Smoking status: Never   Smokeless tobacco: Never   Tobacco comments:    smoking cessation materials not required  Vaping Use   Vaping Use: Never used  Substance and Sexual Activity   Alcohol use: No    Alcohol/week: 0.0 standard drinks   Drug use: No   Sexual activity: Not Currently  Other Topics Concern   Not on file  Social History Narrative   Patient lives in independent living at [facility] Strain: Low Risk    Difficulty of Paying Living Expenses: Not hard at all  Food Insecurity: No Food Insecurity   Worried About Charity fundraiser in the Last Year: Never true   Arboriculturist in the Last Year: Never true  Transportation Needs: Not on file  Physical Activity: Inactive   Days of Exercise per Week: 0 days   Minutes of Exercise per Session: 0 min  Stress: No Stress Concern Present   Feeling of Stress : Not at all   Social Connections: Socially Isolated   Frequency of Communication with Friends and Family: More than three times a week   Frequency of Social Gatherings with Friends and Family: Three times a week   Attends Religious Services: Never   Active Member of Clubs or Organizations: No   Attends Archivist Meetings: Never   Marital Status: Widowed  Human resources officer Violence: Not At Risk   Fear of Current or Ex-Partner: No   Emotionally Abused: No   Physically Abused: No   Sexually Abused: No    Family History  Problem Relation Age of Onset   Heart disease Mother    Heart attack Paternal Uncle    Breast cancer Daughter 70       genetic negative   Cancer Paternal Grandmother      Current Outpatient Medications:  acetaminophen (TYLENOL) 325 MG tablet, Take 650 mg by mouth every 6 (six) hours as needed., Disp: , Rfl:    aspirin 81 MG tablet, Take 1 tablet by mouth daily., Disp: , Rfl:    Calcium-Magnesium 500-250 MG TABS, Take 1 tablet by mouth daily. , Disp: , Rfl:    Cholecalciferol (VITAMIN D3) 25 MCG (1000 UT) CAPS, Take 1 capsule by mouth daily. , Disp: , Rfl:    clindamycin (CLEOCIN) 300 MG capsule, Take 300 mg by mouth once. Prior to dental appointment (Patient not taking: No sig reported), Disp: , Rfl:    Coenzyme Q10 (CO Q-10) 100 MG CAPS, Take 1 tablet by mouth daily. , Disp: , Rfl:    ezetimibe (ZETIA) 10 MG tablet, TAKE 1 TABLET BY MOUTH  DAILY, Disp: 90 tablet, Rfl: 3   gabapentin (NEURONTIN) 300 MG capsule, TAKE 3 CAPSULES BY MOUTH  TWICE DAILY, Disp: 540 capsule, Rfl: 3   Liniments (ACE PAIN RELIEVING PATCH EX), Apply topically. Ameo, Disp: , Rfl:    lisinopril-hydrochlorothiazide (ZESTORETIC) 20-25 MG tablet, TAKE 1 AND 1/2 TABLETS BY  MOUTH DAILY, Disp: 135 tablet, Rfl: 1   meloxicam (MOBIC) 15 MG tablet, TAKE 1 TABLET BY MOUTH  DAILY, Disp: 90 tablet, Rfl: 1   Multiple Vitamins-Minerals (MULTIVITAMIN WITH MINERALS) tablet, Take 1 tablet by mouth daily., Disp:  , Rfl:    omeprazole (PRILOSEC) 20 MG capsule, TAKE 1 CAPSULE BY MOUTH  DAILY, Disp: 90 capsule, Rfl: 1   traMADol (ULTRAM) 50 MG tablet, TAKE 1/2 TO 1 TABLET BY  MOUTH EVERY 8 HOURS AS  NEEDED FOR PAIN (Patient taking differently: TAKE 1/2 TO 1 TABLET BY  MOUTH EVERY 8 HOURS AS  NEEDED FOR PAIN), Disp: 90 tablet, Rfl: 0  Physical exam:  Vitals:   05/22/21 1351  BP: (!) 166/85  Pulse: 69  Resp: 17  Temp: 97.8 F (36.6 C)  SpO2: 96%  Weight: 195 lb 6.4 oz (88.6 kg)  Height: $Remove'5\' 4"'[id number]$  (1.626 m)   Physical Exam Constitutional:      Comments: Elderly frail woman who ambulates with a walker.  Appears in no acute distress  Eyes:     Pupils: Pupils are equal, round, and reactive to light.  Cardiovascular:     Rate and Rhythm: Normal rate and regular rhythm.     Heart sounds: Normal heart sounds.  Pulmonary:     Effort: Pulmonary effort is normal.     Breath sounds: Normal breath sounds.  Abdominal:     General: Bowel sounds are normal.     Palpations: Abdomen is soft.  Skin:    General: Skin is warm and dry.  Neurological:     Mental Status: She is alert and oriented to person, place, and time.     CMP Latest Ref Rng & Units 02/19/2021  Glucose 70 - 99 mg/dL 93  BUN 8 - 23 mg/dL 25(H)  Creatinine 0.44 - 1.00 mg/dL 1.02(H)  Sodium 135 - 145 mmol/L 136  Potassium 3.5 - 5.1 mmol/L 3.7  Chloride 98 - 111 mmol/L 97(L)  CO2 22 - 32 mmol/L 28  Calcium 8.9 - 10.3 mg/dL 9.8  Total Protein 6.5 - 8.1 g/dL 7.1  Total Bilirubin 0.3 - 1.2 mg/dL 0.6  Alkaline Phos 38 - 126 U/L 71  AST 15 - 41 U/L 17  ALT 0 - 44 U/L 10   CBC Latest Ref Rng & Units 05/22/2021  WBC 4.0 - 10.5 K/uL 6.1  Hemoglobin 12.0 - 15.0 g/dL  12.2  Hematocrit 36.0 - 46.0 % 36.2  Platelets 150 - 400 K/uL 207      Assessment and plan- Patient is a 87 y.o. female with metastatic ER positive breast cancer with bone metastases on Faslodex since 2018 here for routine follow-up and for the next dose of Faslodex  I have  reviewed patient's prior medical history in detail.  Also reviewed PET CT scan images from December 2021 Which did not reveal any evidence of nodal or visceral metastatic disease.  She was noted to have several lucent lesions in the thoracic and lumbar spine without significant metabolic activity consistent with treated metastases.  She has been on Faslodex since 2018 on a monthly basis.  I discussed potentially considering switching her from Faslodex to Arimidex to avoid monthly injections.  She has never failed an AI before.  If she were to have any side effects from AI we can always switch her back to Faslodex in the future.  Patient would like to think about it and get back to [place].  In the meanwhile she will continue to get monthly Faslodex.  I will see her back in 3 months and plan to repeat scans on a yearly basis in 6 months.  Patient has baseline osteopenia and I will plan to repeat bone density scan in 3 months and see her at that time with CBC with differential CMP and CA 27-29   Visit Diagnosis 1. Metastatic breast cancer ([place])   2. Bone metastases ([place])   3. Use of fulvestrant (Faslodex)      Dr. Randa Evens, MD, MPH [facility] [phone] 05/22/2021 1:41 PM

## 2021-05-22 NOTE — Progress Notes (Signed)
Pt complains of lower back pain today  on a scale of 8 pain she states its near here spine and hips . Patients fell 3 weeks ago and hurt her did have xray and said it was just tissue no broken bone. Patient does not have transportation has had bad experience with [employer] due to her walker .

## 2021-05-23 ENCOUNTER — Ambulatory Visit: Payer: Medicare Other | Admitting: Oncology

## 2021-05-23 ENCOUNTER — Ambulatory Visit: Payer: Medicare Other

## 2021-05-23 ENCOUNTER — Other Ambulatory Visit: Payer: Medicare Other

## 2021-05-23 LAB — CANCER ANTIGEN 27.29: CA 27.29: 14.9 U/mL (ref 0.0–38.6)

## 2021-05-25 ENCOUNTER — Telehealth: Payer: Self-pay

## 2021-05-25 NOTE — Telephone Encounter (Signed)
-----   Message from Lucilla Lame, MD sent at 05/24/2021  5:26 PM EDT ----- But the patient know her complete like count was actually normal with a normal hemoglobin and hematocrit with a normal white cell count.

## 2021-05-25 NOTE — Telephone Encounter (Signed)
Pt notified of results through my chart

## 2021-06-01 ENCOUNTER — Telehealth: Payer: Self-pay | Admitting: *Deleted

## 2021-06-01 NOTE — Telephone Encounter (Signed)
Patient called stating that she has researched and thought about Dr Elroy Channel comment and question about trying a pill instead of injection and she has questions to ask and would like to see Dr Janese Banks at her next appointment for injection

## 2021-06-01 NOTE — Telephone Encounter (Signed)
I can see her Kelli Williams but 7/14 is a Thursday. Please see how you can coordinate this

## 2021-06-21 ENCOUNTER — Ambulatory Visit: Payer: Medicare Other

## 2021-06-25 ENCOUNTER — Inpatient Hospital Stay: Payer: Medicare Other | Attending: Hematology and Oncology

## 2021-06-25 ENCOUNTER — Encounter: Payer: Self-pay | Admitting: Oncology

## 2021-06-25 ENCOUNTER — Other Ambulatory Visit: Payer: Self-pay

## 2021-06-25 ENCOUNTER — Inpatient Hospital Stay (HOSPITAL_BASED_OUTPATIENT_CLINIC_OR_DEPARTMENT_OTHER): Payer: Medicare Other | Admitting: Oncology

## 2021-06-25 ENCOUNTER — Inpatient Hospital Stay: Payer: Medicare Other

## 2021-06-25 DIAGNOSIS — Z79899 Other long term (current) drug therapy: Secondary | ICD-10-CM | POA: Diagnosis not present

## 2021-06-25 DIAGNOSIS — C7951 Secondary malignant neoplasm of bone: Secondary | ICD-10-CM | POA: Diagnosis not present

## 2021-06-25 DIAGNOSIS — C50919 Malignant neoplasm of unspecified site of unspecified female breast: Secondary | ICD-10-CM

## 2021-06-25 DIAGNOSIS — Z170 Estrogen receptor positive status [ER+]: Secondary | ICD-10-CM | POA: Insufficient documentation

## 2021-06-25 DIAGNOSIS — C50412 Malignant neoplasm of upper-outer quadrant of left female breast: Secondary | ICD-10-CM | POA: Insufficient documentation

## 2021-06-25 MED ORDER — LETROZOLE 2.5 MG PO TABS: 2.5000 mg | ORAL_TABLET | Freq: Every day | ORAL | 1 refills | Status: DC

## 2021-06-28 ENCOUNTER — Other Ambulatory Visit: Payer: Self-pay | Admitting: Internal Medicine

## 2021-06-28 ENCOUNTER — Other Ambulatory Visit: Payer: Self-pay | Admitting: Cardiovascular Disease

## 2021-06-28 DIAGNOSIS — G5793 Unspecified mononeuropathy of bilateral lower limbs: Secondary | ICD-10-CM

## 2021-07-01 ENCOUNTER — Encounter: Payer: Self-pay | Admitting: Hematology and Oncology

## 2021-07-01 NOTE — Progress Notes (Signed)
Hematology/Oncology Consult note [facility]  [phone] [phone]  Patient Care Team: Glean Hess, MD as PCP - General (Internal Medicine) [name], MD as Consulting Physician (Cardiology) Renata Caprice as Physician Assistant (Orthopedic Surgery) Bary Castilla Forest Gleason, MD (General Surgery) Hessie Knows, MD as Consulting Physician (Orthopedic Surgery) Lequita Asal, MD as Consulting Physician (Hematology and Oncology) Sindy Guadeloupe, MD as Consulting Physician (Hematology and Oncology)   Name of the patient: Kelli Williams  [id number]  26-May-1926   Date of visit: 07/01/21  Diagnosis- metastatic ER positive breast cancer  Chief complaint/ Reason for visit-discuss treatment options for breast cancer  Heme/Onc history: [name] who was previously seeing Dr. Mike Gip and is now transferring her care to me.  She was diagnosed with left breast cancer in August 2000 and NTU.  It was a 0.8 cm grade 2 invasive mammary carcinoma ER 50% positive PR 90% positive.  She underwent adjuvant radiation treatment and completed 5 years of tamoxifen in July 2005.  She was noted to have metastatic breast cancer on left axillary biopsy in August 2018.  Pathology showed 1.1 cm grade 2 invasive mammary carcinoma with focal lobular features.  1 out of 4 axillary lymph nodes with extracapsular extension.  Tumor greater than 90% ER/PR positive and HER2 negative by FISH.  PET scan also showed widespread hypermetabolic osseous bone lesions she has been on Faslodex since August 2018 with stable disease and most recent PET scan in December 2021 showed no evidence of recurrence or progression.  Patient was also on Xgeva previously which was given between October 2018 but stopped in May 2019 due to dental issues  Interval history-patient is interested in exploring oral hormone therapy options and coming off Faslodex injections  on a monthly basis.  ECOG PS- 2 Pain scale- 0   Review of systems- Review of Systems  Constitutional:  Positive for malaise/fatigue. Negative for chills, fever and weight loss.  HENT:  Negative for congestion, ear discharge and nosebleeds.   Eyes:  Negative for blurred vision.  Respiratory:  Negative for cough, hemoptysis, sputum production, shortness of breath and wheezing.   Cardiovascular:  Negative for chest pain, palpitations, orthopnea and claudication.  Gastrointestinal:  Negative for abdominal pain, blood in stool, constipation, diarrhea, heartburn, melena, nausea and vomiting.  Genitourinary:  Negative for dysuria, flank pain, frequency, hematuria and urgency.  Musculoskeletal:  Negative for back pain, joint pain and myalgias.  Skin:  Negative for rash.  Neurological:  Negative for dizziness, tingling, focal weakness, seizures, weakness and headaches.  Endo/Heme/Allergies:  Does not bruise/bleed easily.  Psychiatric/Behavioral:  Negative for depression and suicidal ideas. The patient does not have insomnia.      Allergies  Allergen Reactions   Amitriptyline Hives   Amoxicillin     Diarrhea     Black Pepper [Piper]    Ivp Dye [Iodinated Diagnostic Agents]    Tape Other (See Comments)    Whelps - please use paper tape.   Sulfa Antibiotics Rash   Sulfa Antibiotics Rash     Past Medical History:  Diagnosis Date   Anxiety    Arthritis    BOTH FEET AND KNEES   Breast cancer ([place]) 2000   left breast ca with lumpectomy and rad tx 5 yr tamoxifen/Dr Jeannine Kitten at [facility]   Complication of anesthesia    WOKE UP DURING SURGERY FOR DEVIATED SEPTUM, KNEE REPLACEMENT AND BREAST LUMPECTOMY   Dysrhythmia  GERD (gastroesophageal reflux disease)    Hypertension    Neuropathic pain of both legs    Neuropathy    Personal history of chemotherapy    Personal history of radiation therapy    Pneumonia 2016   Skin cancer of forehead    Skin cancer of nose      Past Surgical  History:  Procedure Laterality Date   ABDOMINAL HYSTERECTOMY     AXILLARY LYMPH NODE BIOPSY Left 07/10/2017   INVASIVE MAMMARY CARCINOMA WITH FOCAL LOBULAR FEATURES.    BREAST BIOPSY Bilateral 1960   benign   BREAST LUMPECTOMY Left 2000   breast ca with rad tx   BREAST LUMPECTOMY Left 08/20/2017   BREAST LUMPECTOMY Left 08/20/2017   Procedure: left breast wide excision;  Surgeon: Robert Bellow, MD;  Location: [facility] ORS;  Service: General;  Laterality: Left;   CATARACT EXTRACTION     left eye   CHOLECYSTECTOMY     JOINT REPLACEMENT     NASAL SEPTUM SURGERY     PARTIAL KNEE ARTHROPLASTY     right   TOTAL VAGINAL HYSTERECTOMY      Social History   Socioeconomic History   Marital status: Widowed    Spouse name: Not on file   Number of children: 3   Years of education: some college   Highest education level: 12th grade  Occupational History   Occupation: Retired  Tobacco Use   Smoking status: Never   Smokeless tobacco: Never   Tobacco comments:    smoking cessation materials not required  Vaping Use   Vaping Use: Never used  Substance and Sexual Activity   Alcohol use: No    Alcohol/week: 0.0 standard drinks   Drug use: No   Sexual activity: Not Currently  Other Topics Concern   Not on file  Social History Narrative   Patient lives in independent living at [facility] Strain: Low Risk    Difficulty of Paying Living Expenses: Not hard at all  Food Insecurity: No Food Insecurity   Worried About Charity fundraiser in the Last Year: Never true   Arboriculturist in the Last Year: Never true  Transportation Needs: Not on file  Physical Activity: Inactive   Days of Exercise per Week: 0 days   Minutes of Exercise per Session: 0 min  Stress: No Stress Concern Present   Feeling of Stress : Not at all  Social Connections: Socially Isolated   Frequency of Communication with Friends and Family: More than three  times a week   Frequency of Social Gatherings with Friends and Family: Three times a week   Attends Religious Services: Never   Active Member of Clubs or Organizations: No   Attends Archivist Meetings: Never   Marital Status: Widowed  Human resources officer Violence: Not At Risk   Fear of Current or Ex-Partner: No   Emotionally Abused: No   Physically Abused: No   Sexually Abused: No    Family History  Problem Relation Age of Onset   Heart disease Mother    Heart attack Paternal Uncle    Breast cancer Daughter 74       genetic negative   Cancer Paternal Grandmother      Current Outpatient Medications:    acetaminophen (TYLENOL) 325 MG tablet, Take 650 mg by mouth every 6 (six) hours as needed., Disp: , Rfl:    aspirin 81  MG tablet, Take 1 tablet by mouth daily., Disp: , Rfl:    Calcium-Magnesium 500-250 MG TABS, Take 1 tablet by mouth daily. , Disp: , Rfl:    Cholecalciferol (VITAMIN D3) 25 MCG (1000 UT) CAPS, Take 1 capsule by mouth daily. , Disp: , Rfl:    Coenzyme Q10 (CO Q-10) 100 MG CAPS, Take 1 tablet by mouth daily. , Disp: , Rfl:    letrozole (FEMARA) 2.5 MG tablet, Take 1 tablet (2.5 mg total) by mouth daily., Disp: 90 tablet, Rfl: 1   Liniments (ACE PAIN RELIEVING PATCH EX), Apply topically. Ameo, Disp: , Rfl:    lisinopril-hydrochlorothiazide (ZESTORETIC) 20-25 MG tablet, TAKE 1 AND 1/2 TABLETS BY  MOUTH DAILY, Disp: 135 tablet, Rfl: 1   meloxicam (MOBIC) 15 MG tablet, TAKE 1 TABLET BY MOUTH  DAILY, Disp: 90 tablet, Rfl: 1   Multiple Vitamins-Minerals (MULTIVITAMIN WITH MINERALS) tablet, Take 1 tablet by mouth daily., Disp: , Rfl:    omeprazole (PRILOSEC) 20 MG capsule, TAKE 1 CAPSULE BY MOUTH  DAILY, Disp: 90 capsule, Rfl: 1   clindamycin (CLEOCIN) 300 MG capsule, Take 300 mg by mouth once. Prior to dental appointment (Patient not taking: No sig reported), Disp: , Rfl:    ezetimibe (ZETIA) 10 MG tablet, TAKE 1 TABLET BY MOUTH  DAILY, Disp: 90 tablet, Rfl: 0    gabapentin (NEURONTIN) 300 MG capsule, TAKE 3 CAPSULES BY MOUTH  TWICE DAILY, Disp: 540 capsule, Rfl: 0   traMADol (ULTRAM) 50 MG tablet, TAKE 1/2 TO 1 TABLET BY  MOUTH EVERY 8 HOURS AS  NEEDED FOR PAIN (Patient taking differently: TAKE 1/2 TO 1 TABLET BY  MOUTH EVERY 8 HOURS AS  NEEDED FOR PAIN), Disp: 90 tablet, Rfl: 0   Physical Exam Constitutional:      General: She is not in acute distress. Cardiovascular:     Rate and Rhythm: Normal rate and regular rhythm.     Heart sounds: Normal heart sounds.  Pulmonary:     Effort: Pulmonary effort is normal.     Breath sounds: Normal breath sounds.  Abdominal:     General: Bowel sounds are normal.     Palpations: Abdomen is soft.  Skin:    General: Skin is warm and dry.  Neurological:     Mental Status: She is alert and oriented to person, place, and time.     CMP Latest Ref Rng & Units 05/22/2021  Glucose 70 - 99 mg/dL 104(H)  BUN 8 - 23 mg/dL 27(H)  Creatinine 0.44 - 1.00 mg/dL 1.01(H)  Sodium 135 - 145 mmol/L 134(L)  Potassium 3.5 - 5.1 mmol/L 3.7  Chloride 98 - 111 mmol/L 94(L)  CO2 22 - 32 mmol/L 29  Calcium 8.9 - 10.3 mg/dL 9.6  Total Protein 6.5 - 8.1 g/dL 7.4  Total Bilirubin 0.3 - 1.2 mg/dL 0.5  Alkaline Phos 38 - 126 U/L 76  AST 15 - 41 U/L 22  ALT 0 - 44 U/L 12   CBC Latest Ref Rng & Units 05/22/2021  WBC 4.0 - 10.5 K/uL 6.1  Hemoglobin 12.0 - 15.0 g/dL 12.2  Hematocrit 36.0 - 46.0 % 36.2  Platelets 150 - 400 K/uL 207     Assessment and plan- Patient is a 87 y.o. female with ER positive breast cancer and bone metastases on Faslodex since 2018 here to discuss oral hormone therapy options for breast cancer  Patient was diagnosed with metastatic breast cancer and bone metastases back in 2018 and since then has been  on Faslodex.She is never really failed oral aromatase inhibitors.  Her most recent PET scan in December 2021 showed no evidence of breast cancer recurrence or progression.  Lucent lesions in the thoracic and  lumbar spine without metabolic activity consistent with treated metastases.  He is not presently receiving any bisphosphonates.  Then she is getting tired of coming for monthly Faslodex injections.  We discussed switching her to letrozole at this time.  Discussed risks and benefits of letrozole including all but not limited to hot flashes, mood swings, arthralgias and worsening bone health.  The symptoms would be similar to what women experience with Faslodex and she has really tolerated Faslodex well for the most part.  I will send her a prescription for letrozole to her pharmacy which she will take along with calcium and vitamin D.  I will schedule her for repeat bone density scan in October 2022.  Cancel all Faslodex injections for now and I will see her back in 2 months with labs and tumor markers   Visit Diagnosis 1. Metastatic breast cancer [facility])      Dr. Randa Evens, MD, MPH [facility] at [facility] [phone] 07/01/2021 6:01 PM

## 2021-07-12 ENCOUNTER — Other Ambulatory Visit: Payer: Self-pay | Admitting: Internal Medicine

## 2021-07-12 DIAGNOSIS — I10 Essential (primary) hypertension: Secondary | ICD-10-CM

## 2021-07-12 NOTE — Telephone Encounter (Signed)
Patient needs appointment.

## 2021-07-17 ENCOUNTER — Telehealth: Payer: Self-pay | Admitting: *Deleted

## 2021-07-17 NOTE — Telephone Encounter (Signed)
Please call her

## 2021-07-17 NOTE — Telephone Encounter (Signed)
Patient called for advice regarding letrozole she reports that since starting the medication it has not "agreed" with her. Unable to contact patient for more information.

## 2021-07-19 ENCOUNTER — Encounter: Payer: Self-pay | Admitting: Hematology and Oncology

## 2021-07-20 ENCOUNTER — Encounter: Payer: Self-pay | Admitting: *Deleted

## 2021-07-23 ENCOUNTER — Ambulatory Visit: Payer: Medicare Other

## 2021-08-05 NOTE — Telephone Encounter (Signed)
Please call her

## 2021-08-06 ENCOUNTER — Other Ambulatory Visit: Payer: Medicare Other

## 2021-08-06 ENCOUNTER — Encounter: Payer: Medicare Other | Admitting: Nurse Practitioner

## 2021-08-07 ENCOUNTER — Inpatient Hospital Stay (HOSPITAL_BASED_OUTPATIENT_CLINIC_OR_DEPARTMENT_OTHER): Payer: Medicare Other | Admitting: Nurse Practitioner

## 2021-08-07 ENCOUNTER — Inpatient Hospital Stay: Payer: Medicare Other | Attending: Hematology and Oncology

## 2021-08-07 VITALS — BP 153/73 | HR 62 | Temp 98.4°F | Resp 18 | Wt 192.0 lb

## 2021-08-07 DIAGNOSIS — Z170 Estrogen receptor positive status [ER+]: Secondary | ICD-10-CM | POA: Diagnosis not present

## 2021-08-07 DIAGNOSIS — C7951 Secondary malignant neoplasm of bone: Secondary | ICD-10-CM | POA: Diagnosis not present

## 2021-08-07 DIAGNOSIS — Z79811 Long term (current) use of aromatase inhibitors: Secondary | ICD-10-CM | POA: Diagnosis not present

## 2021-08-07 DIAGNOSIS — C50412 Malignant neoplasm of upper-outer quadrant of left female breast: Secondary | ICD-10-CM | POA: Diagnosis not present

## 2021-08-07 DIAGNOSIS — R197 Diarrhea, unspecified: Secondary | ICD-10-CM

## 2021-08-07 LAB — CBC WITH DIFFERENTIAL/PLATELET
Abs Immature Granulocytes: 0.02 10*3/uL (ref 0.00–0.07)
Basophils Absolute: 0.1 10*3/uL (ref 0.0–0.1)
Basophils Relative: 1 %
Eosinophils Absolute: 0.4 10*3/uL (ref 0.0–0.5)
Eosinophils Relative: 6 %
HCT: 37.9 % (ref 36.0–46.0)
Hemoglobin: 12.7 g/dL (ref 12.0–15.0)
Immature Granulocytes: 0 %
Lymphocytes Relative: 17 %
Lymphs Abs: 1.1 10*3/uL (ref 0.7–4.0)
MCH: 29.4 pg (ref 26.0–34.0)
MCHC: 33.5 g/dL (ref 30.0–36.0)
MCV: 87.7 fL (ref 80.0–100.0)
Monocytes Absolute: 0.5 10*3/uL (ref 0.1–1.0)
Monocytes Relative: 8 %
Neutro Abs: 4.6 10*3/uL (ref 1.7–7.7)
Neutrophils Relative %: 68 %
Platelets: 200 10*3/uL (ref 150–400)
RBC: 4.32 MIL/uL (ref 3.87–5.11)
RDW: 14.3 % (ref 11.5–15.5)
WBC: 6.7 10*3/uL (ref 4.0–10.5)
nRBC: 0 % (ref 0.0–0.2)

## 2021-08-07 LAB — COMPREHENSIVE METABOLIC PANEL
ALT: 11 U/L (ref 0–44)
AST: 20 U/L (ref 15–41)
Albumin: 4.2 g/dL (ref 3.5–5.0)
Alkaline Phosphatase: 78 U/L (ref 38–126)
Anion gap: 10 (ref 5–15)
BUN: 27 mg/dL — ABNORMAL HIGH (ref 8–23)
CO2: 28 mmol/L (ref 22–32)
Calcium: 9.4 mg/dL (ref 8.9–10.3)
Chloride: 97 mmol/L — ABNORMAL LOW (ref 98–111)
Creatinine, Ser: 0.98 mg/dL (ref 0.44–1.00)
GFR, Estimated: 54 mL/min — ABNORMAL LOW (ref >60)
Glucose, Bld: 100 mg/dL — ABNORMAL HIGH (ref 70–99)
Potassium: 3.9 mmol/L (ref 3.5–5.1)
Sodium: 135 mmol/L (ref 135–145)
Total Bilirubin: 0.4 mg/dL (ref 0.3–1.2)
Total Protein: 7.4 g/dL (ref 6.5–8.1)

## 2021-08-07 NOTE — Progress Notes (Signed)
Symptom Management [facility]  [phone] [phone]  Patient Care Team: Glean Hess, MD as PCP - General (Internal Medicine) [name], MD as Consulting Physician (Cardiology) Renata Caprice as Physician Assistant (Orthopedic Surgery) Bary Castilla Forest Gleason, MD (General Surgery) Hessie Knows, MD as Consulting Physician (Orthopedic Surgery) Lequita Asal, MD (Inactive) as Consulting Physician (Hematology and Oncology) Sindy Guadeloupe, MD as Consulting Physician (Hematology and Oncology)   Name of the patient: Kelli Williams  [id number]  1926/08/01   Date of visit: 08/07/21  Diagnosis-metastatic ER positive breast cancer  Chief complaint/ Reason for visit-  Heme/Onc history:  [name] who was previously seeing Dr. Mike Gip and is now transferring her care to me.  She was diagnosed with left breast cancer in August 2000 and NTU.  It was a 0.8 cm grade 2 invasive mammary carcinoma ER 50% positive PR 90% positive.  She underwent adjuvant radiation treatment and completed 5 years of tamoxifen in July 2005.  She was noted to have metastatic breast cancer on left axillary biopsy in August 2018.  Pathology showed 1.1 cm grade 2 invasive mammary carcinoma with focal lobular features.  1 out of 4 axillary lymph nodes with extracapsular extension.  Tumor greater than 90% ER/PR positive and HER2 negative by FISH.  PET scan also showed widespread hypermetabolic osseous bone lesions she has been on Faslodex since August 2018 with stable disease and most recent PET scan in December 2021 showed no evidence of recurrence or progression.  Patient was also on Xgeva previously which was given between October 2018 but stopped in May 2019 due to dental issues   Patient was switched from Faslodex to letrozole in July 2022   Interval history-patient is 87 year old female who returns to clinic for complaints of  abdominal cramps and diarrhea. She stopped faslodex injections which she had been receiving monthly and was started on oral hormonal therapy, letrozole, in July.  She started letrozole. She held medication in early August after taking it for a few weeks because she had episodes of sharp abdominal pain. She continues to have abdominal cramps but diarrhea has resolved. No nausea, vomiting, constipation. No fevers or chills.    Review of systems- Review of Systems  Constitutional:  Positive for malaise/fatigue. Negative for chills, fever and weight loss.  HENT:  Negative for hearing loss, nosebleeds, sore throat and tinnitus.   Eyes:  Negative for blurred vision and double vision.  Respiratory:  Negative for cough, hemoptysis, shortness of breath and wheezing.   Cardiovascular:  Negative for chest pain, palpitations and leg swelling.  Gastrointestinal:  Positive for abdominal pain. Negative for blood in stool, constipation, diarrhea, melena, nausea and vomiting.  Genitourinary:  Negative for dysuria and urgency.  Musculoskeletal:  Negative for back pain, falls, joint pain and myalgias.  Skin:  Negative for itching and rash.  Neurological:  Negative for dizziness, tingling, sensory change, loss of consciousness, weakness and headaches.  Endo/Heme/Allergies:  Negative for environmental allergies. Does not bruise/bleed easily.  Psychiatric/Behavioral:  Negative for depression. The patient is not nervous/anxious and does not have insomnia.     Allergies  Allergen Reactions   Amitriptyline Hives   Amoxicillin     Diarrhea     Black Pepper [Piper]    Ivp Dye [Iodinated Diagnostic Agents]    Tape Other (See Comments)    Whelps - please use paper tape.   Sulfa Antibiotics Rash   Sulfa Antibiotics Rash  Past Medical History:  Diagnosis Date   Anxiety    Arthritis    BOTH FEET AND KNEES   Breast cancer ([place]) 2000   left breast ca with lumpectomy and rad tx 5 yr tamoxifen/Dr Jeannine Kitten at [facility]    Complication of anesthesia    WOKE UP DURING SURGERY FOR DEVIATED SEPTUM, KNEE REPLACEMENT AND BREAST LUMPECTOMY   Dysrhythmia    GERD (gastroesophageal reflux disease)    Hypertension    Neuropathic pain of both legs    Neuropathy    Personal history of chemotherapy    Personal history of radiation therapy    Pneumonia 2016   Skin cancer of forehead    Skin cancer of nose     Past Surgical History:  Procedure Laterality Date   ABDOMINAL HYSTERECTOMY     AXILLARY LYMPH NODE BIOPSY Left 07/10/2017   INVASIVE MAMMARY CARCINOMA WITH FOCAL LOBULAR FEATURES.    BREAST BIOPSY Bilateral 1960   benign   BREAST LUMPECTOMY Left 2000   breast ca with rad tx   BREAST LUMPECTOMY Left 08/20/2017   BREAST LUMPECTOMY Left 08/20/2017   Procedure: left breast wide excision;  Surgeon: Robert Bellow, MD;  Location: [facility] ORS;  Service: General;  Laterality: Left;   CATARACT EXTRACTION     left eye   CHOLECYSTECTOMY     JOINT REPLACEMENT     NASAL SEPTUM SURGERY     PARTIAL KNEE ARTHROPLASTY     right   TOTAL VAGINAL HYSTERECTOMY      Social History   Socioeconomic History   Marital status: Widowed    Spouse name: Not on file   Number of children: 3   Years of education: some college   Highest education level: 12th grade  Occupational History   Occupation: Retired  Tobacco Use   Smoking status: Never   Smokeless tobacco: Never   Tobacco comments:    smoking cessation materials not required  Vaping Use   Vaping Use: Never used  Substance and Sexual Activity   Alcohol use: No    Alcohol/week: 0.0 standard drinks   Drug use: No   Sexual activity: Not Currently  Other Topics Concern   Not on file  Social History Narrative   Patient lives in independent living at [place] [facility] Strain: Low Risk    Difficulty of Paying Living Expenses: Not hard at all  Food Insecurity: No Food Insecurity   Worried About Sales executive in the Last Year: Never true   Arboriculturist in the Last Year: Never true  Transportation Needs: Not on file  Physical Activity: Inactive   Days of Exercise per Week: 0 days   Minutes of Exercise per Session: 0 min  Stress: No Stress Concern Present   Feeling of Stress : Not at all  Social Connections: Socially Isolated   Frequency of Communication with Friends and Family: More than three times a week   Frequency of Social Gatherings with Friends and Family: Three times a week   Attends Religious Services: Never   Active Member of Clubs or Organizations: No   Attends Archivist Meetings: Never   Marital Status: Widowed  Human resources officer Violence: Not At Risk   Fear of Current or Ex-Partner: No   Emotionally Abused: No   Physically Abused: No   Sexually Abused: No    Family History  Problem Relation Age of Onset  Heart disease Mother    Heart attack Paternal Uncle    Breast cancer Daughter 73       genetic negative   Cancer Paternal Grandmother      Current Outpatient Medications:    acetaminophen (TYLENOL) 325 MG tablet, Take 650 mg by mouth every 6 (six) hours as needed., Disp: , Rfl:    aspirin 81 MG tablet, Take 1 tablet by mouth daily., Disp: , Rfl:    Calcium-Magnesium 500-250 MG TABS, Take 1 tablet by mouth daily. , Disp: , Rfl:    Cholecalciferol (VITAMIN D3) 25 MCG (1000 UT) CAPS, Take 1 capsule by mouth daily. , Disp: , Rfl:    clindamycin (CLEOCIN) 300 MG capsule, Take 300 mg by mouth once. Prior to dental appointment (Patient not taking: No sig reported), Disp: , Rfl:    Coenzyme Q10 (CO Q-10) 100 MG CAPS, Take 1 tablet by mouth daily. , Disp: , Rfl:    ezetimibe (ZETIA) 10 MG tablet, TAKE 1 TABLET BY MOUTH  DAILY, Disp: 90 tablet, Rfl: 0   gabapentin (NEURONTIN) 300 MG capsule, TAKE 3 CAPSULES BY MOUTH  TWICE DAILY, Disp: 540 capsule, Rfl: 0   letrozole (FEMARA) 2.5 MG tablet, Take 1 tablet (2.5 mg total) by mouth daily. (Patient not taking:  Reported on 08/07/2021), Disp: 90 tablet, Rfl: 1   Liniments (ACE PAIN RELIEVING PATCH EX), Apply topically. Ameo, Disp: , Rfl:    lisinopril-hydrochlorothiazide (ZESTORETIC) 20-25 MG tablet, TAKE 1 AND 1/2 TABLETS BY  MOUTH DAILY, Disp: 135 tablet, Rfl: 3   meloxicam (MOBIC) 15 MG tablet, TAKE 1 TABLET BY MOUTH  DAILY, Disp: 90 tablet, Rfl: 1   Multiple Vitamins-Minerals (MULTIVITAMIN WITH MINERALS) tablet, Take 1 tablet by mouth daily., Disp: , Rfl:    omeprazole (PRILOSEC) 20 MG capsule, TAKE 1 CAPSULE BY MOUTH  DAILY, Disp: 90 capsule, Rfl: 1   traMADol (ULTRAM) 50 MG tablet, TAKE 1/2 TO 1 TABLET BY  MOUTH EVERY 8 HOURS AS  NEEDED FOR PAIN (Patient taking differently: TAKE 1/2 TO 1 TABLET BY  MOUTH EVERY 8 HOURS AS  NEEDED FOR PAIN), Disp: 90 tablet, Rfl: 0  Physical exam:  Vitals:   08/07/21 1340  BP: (!) 153/73  Pulse: 62  Resp: 18  Temp: 98.4 F (36.9 C)  TempSrc: Tympanic  SpO2: 100%  Weight: 192 lb (87.1 kg)   Physical Exam Constitutional:      General: She is not in acute distress.    Appearance: She is well-developed. She is not ill-appearing.  HENT:     Head: Atraumatic.     Nose: Nose normal.     Mouth/Throat:     Pharynx: No oropharyngeal exudate.  Eyes:     General: No scleral icterus.    Conjunctiva/sclera: Conjunctivae normal.  Cardiovascular:     Rate and Rhythm: Normal rate and regular rhythm.  Pulmonary:     Effort: Pulmonary effort is normal.     Breath sounds: Normal breath sounds.  Abdominal:     General: Bowel sounds are normal. There is no distension.     Palpations: Abdomen is soft. There is no mass.     Tenderness: There is no abdominal tenderness. There is no guarding or rebound.  Musculoskeletal:        General: No tenderness or deformity. Normal range of motion.     Cervical back: Normal range of motion and neck supple.  Skin:    General: Skin is warm and dry.  Coloration: Skin is not pale.  Neurological:     General: No focal deficit  present.     Mental Status: She is alert and oriented to person, place, and time.  Psychiatric:        Mood and Affect: Mood normal.        Behavior: Behavior normal.     CMP Latest Ref Rng & Units 08/07/2021  Glucose 70 - 99 mg/dL 100(H)  BUN 8 - 23 mg/dL 27(H)  Creatinine 0.44 - 1.00 mg/dL 0.98  Sodium 135 - 145 mmol/L 135  Potassium 3.5 - 5.1 mmol/L 3.9  Chloride 98 - 111 mmol/L 97(L)  CO2 22 - 32 mmol/L 28  Calcium 8.9 - 10.3 mg/dL 9.4  Total Protein 6.5 - 8.1 g/dL 7.4  Total Bilirubin 0.3 - 1.2 mg/dL 0.4  Alkaline Phos 38 - 126 U/L 78  AST 15 - 41 U/L 20  ALT 0 - 44 U/L 11   CBC Latest Ref Rng & Units 08/07/2021  WBC 4.0 - 10.5 K/uL 6.7  Hemoglobin 12.0 - 15.0 g/dL 12.7  Hematocrit 36.0 - 46.0 % 37.9  Platelets 150 - 400 K/uL 200    No images are attached to the encounter.  No results found.  Assessment and plan- Patient is a 87 y.o. female diagnosed with ER positive breast cancer with bone metastases currently on letrozole since July.  She has held medication due to abdominal pain and diarrhea.  Lengthy discussion today that I do not feel that the symptoms are related to letrozole.  Etiology is unclear however they have now resolved and she feels back to baseline.  Exam is unrevealing.  Labs reassuring she is willing to restart letrozole.  Suspect she will get CT in December however, discussed that if symptoms recur or intensify we could do that sooner.  Follow-up with Dr. Janese Banks as scheduled.  Return to clinic if symptoms do not improve or worsen in the interim.   Visit Diagnosis 1. Diarrhea, unspecified type   2. Long term current use of aromatase inhibitor     Patient expressed understanding and was in agreement with this plan. She also understands that She can call clinic at any time with any questions, concerns, or complaints.   Thank you for allowing me to participate in the care of this very pleasant patient.   Beckey Rutter, DNP, AGNP-[facility] [place] [facility] [address]

## 2021-08-07 NOTE — Progress Notes (Signed)
States that after she started letrozole, she developed abdominal cramps and diarrhea. She has since stopped the letrozole, which has been about 2 weeks ago. Continues to have some abdominal cramps, but denies any further diarrhea.

## 2021-08-08 LAB — CANCER ANTIGEN 27.29: CA 27.29: 17.5 U/mL (ref 0.0–38.6)

## 2021-08-26 ENCOUNTER — Other Ambulatory Visit: Payer: Self-pay | Admitting: *Deleted

## 2021-08-26 DIAGNOSIS — Z170 Estrogen receptor positive status [ER+]: Secondary | ICD-10-CM

## 2021-08-26 DIAGNOSIS — C50412 Malignant neoplasm of upper-outer quadrant of left female breast: Secondary | ICD-10-CM

## 2021-08-28 ENCOUNTER — Other Ambulatory Visit: Payer: Self-pay

## 2021-08-28 ENCOUNTER — Inpatient Hospital Stay (HOSPITAL_BASED_OUTPATIENT_CLINIC_OR_DEPARTMENT_OTHER): Payer: Medicare Other | Admitting: Oncology

## 2021-08-28 ENCOUNTER — Encounter: Payer: Self-pay | Admitting: Oncology

## 2021-08-28 ENCOUNTER — Inpatient Hospital Stay: Payer: Medicare Other | Attending: Hematology and Oncology

## 2021-08-28 ENCOUNTER — Ambulatory Visit: Payer: Medicare Other

## 2021-08-28 ENCOUNTER — Other Ambulatory Visit: Payer: Self-pay | Admitting: *Deleted

## 2021-08-28 VITALS — BP 148/65 | HR 60 | Temp 98.8°F | Resp 18 | Wt 188.0 lb

## 2021-08-28 DIAGNOSIS — Z170 Estrogen receptor positive status [ER+]: Secondary | ICD-10-CM | POA: Diagnosis not present

## 2021-08-28 DIAGNOSIS — C50412 Malignant neoplasm of upper-outer quadrant of left female breast: Secondary | ICD-10-CM | POA: Diagnosis not present

## 2021-08-28 DIAGNOSIS — Z923 Personal history of irradiation: Secondary | ICD-10-CM | POA: Insufficient documentation

## 2021-08-28 DIAGNOSIS — Z79899 Other long term (current) drug therapy: Secondary | ICD-10-CM | POA: Insufficient documentation

## 2021-08-28 DIAGNOSIS — C7951 Secondary malignant neoplasm of bone: Secondary | ICD-10-CM | POA: Insufficient documentation

## 2021-08-28 DIAGNOSIS — Z9221 Personal history of antineoplastic chemotherapy: Secondary | ICD-10-CM | POA: Diagnosis not present

## 2021-08-28 DIAGNOSIS — M85851 Other specified disorders of bone density and structure, right thigh: Secondary | ICD-10-CM | POA: Diagnosis not present

## 2021-08-28 DIAGNOSIS — Z79811 Long term (current) use of aromatase inhibitors: Secondary | ICD-10-CM | POA: Diagnosis not present

## 2021-08-28 LAB — CBC WITH DIFFERENTIAL/PLATELET
Abs Immature Granulocytes: 0.03 10*3/uL (ref 0.00–0.07)
Basophils Absolute: 0.1 10*3/uL (ref 0.0–0.1)
Basophils Relative: 1 %
Eosinophils Absolute: 0.3 10*3/uL (ref 0.0–0.5)
Eosinophils Relative: 5 %
HCT: 37.6 % (ref 36.0–46.0)
Hemoglobin: 12.5 g/dL (ref 12.0–15.0)
Immature Granulocytes: 1 %
Lymphocytes Relative: 20 %
Lymphs Abs: 1.3 10*3/uL (ref 0.7–4.0)
MCH: 29.3 pg (ref 26.0–34.0)
MCHC: 33.2 g/dL (ref 30.0–36.0)
MCV: 88.1 fL (ref 80.0–100.0)
Monocytes Absolute: 0.5 10*3/uL (ref 0.1–1.0)
Monocytes Relative: 8 %
Neutro Abs: 4.3 10*3/uL (ref 1.7–7.7)
Neutrophils Relative %: 65 %
Platelets: 209 10*3/uL (ref 150–400)
RBC: 4.27 MIL/uL (ref 3.87–5.11)
RDW: 14.7 % (ref 11.5–15.5)
WBC: 6.5 10*3/uL (ref 4.0–10.5)
nRBC: 0 % (ref 0.0–0.2)

## 2021-08-28 LAB — COMPREHENSIVE METABOLIC PANEL
ALT: 11 U/L (ref 0–44)
AST: 20 U/L (ref 15–41)
Albumin: 4.1 g/dL (ref 3.5–5.0)
Alkaline Phosphatase: 73 U/L (ref 38–126)
Anion gap: 10 (ref 5–15)
BUN: 25 mg/dL — ABNORMAL HIGH (ref 8–23)
CO2: 29 mmol/L (ref 22–32)
Calcium: 9.4 mg/dL (ref 8.9–10.3)
Chloride: 97 mmol/L — ABNORMAL LOW (ref 98–111)
Creatinine, Ser: 0.96 mg/dL (ref 0.44–1.00)
GFR, Estimated: 55 mL/min — ABNORMAL LOW (ref >60)
Glucose, Bld: 105 mg/dL — ABNORMAL HIGH (ref 70–99)
Potassium: 3.8 mmol/L (ref 3.5–5.1)
Sodium: 136 mmol/L (ref 135–145)
Total Bilirubin: 0.5 mg/dL (ref 0.3–1.2)
Total Protein: 7.4 g/dL (ref 6.5–8.1)

## 2021-08-28 NOTE — Progress Notes (Signed)
Pt in for follow up, reports generalized pain all over.  Pt states she uses tylenol mainly for pain with relief.

## 2021-08-29 NOTE — Progress Notes (Signed)
Hematology/Oncology Consult note [facility]  [phone] [phone]  Patient Care Team: Glean Hess, MD as PCP - General (Internal Medicine) [name], MD as Consulting Physician (Cardiology) Renata Caprice as Physician Assistant (Orthopedic Surgery) Bary Castilla Forest Gleason, MD (General Surgery) Hessie Knows, MD as Consulting Physician (Orthopedic Surgery) Lequita Asal, MD (Inactive) as Consulting Physician (Hematology and Oncology) Sindy Guadeloupe, MD as Consulting Physician (Hematology and Oncology)   Name of the patient: Kelli Williams  [id number]  1926-07-12   Date of visit: 08/29/21  Diagnosis- metastatic ER positive breast cancer  Chief complaint/ Reason for visit-routine follow-up of breast cancer on letrozole  Heme/Onc history: [name] who was previously seeing Dr. Mike Gip and is now transferring her care to me.  She was diagnosed with left breast cancer in August 2000 and NTU.  It was a 0.8 cm grade 2 invasive mammary carcinoma ER 50% positive PR 90% positive.  She underwent adjuvant radiation treatment and completed 5 years of tamoxifen in July 2005.  She was noted to have metastatic breast cancer on left axillary biopsy in August 2018.  Pathology showed 1.1 cm grade 2 invasive mammary carcinoma with focal lobular features.  1 out of 4 axillary lymph nodes with extracapsular extension.  Tumor greater than 90% ER/PR positive and HER2 negative by FISH.  PET scan also showed widespread hypermetabolic osseous bone lesions she has been on Faslodex since August 2018 with stable disease and most recent PET scan in December 2021 showed no evidence of recurrence or progression.  Patient was also on Xgeva previously which was given between October 2018 but stopped in May 2019 due to dental issues  Patient was switched from Faslodex to letrozole in July 2022  Interval history-patient reports  that she had a few days of cramping abdominal pain and diarrhea after she is started letrozole.  However those symptoms resolved and patient restarted taking her letrozole.  She is presently tolerating it well without any significant side effects.  She has mild chronic back pain which is unchanged  ECOG PS- 2 Pain scale- 0   Review of systems- Review of Systems  Constitutional:  Positive for malaise/fatigue. Negative for chills, fever and weight loss.  HENT:  Negative for congestion, ear discharge and nosebleeds.   Eyes:  Negative for blurred vision.  Respiratory:  Negative for cough, hemoptysis, sputum production, shortness of breath and wheezing.   Cardiovascular:  Negative for chest pain, palpitations, orthopnea and claudication.  Gastrointestinal:  Negative for abdominal pain, blood in stool, constipation, diarrhea, heartburn, melena, nausea and vomiting.  Genitourinary:  Negative for dysuria, flank pain, frequency, hematuria and urgency.  Musculoskeletal:  Positive for back pain, joint pain and myalgias.  Skin:  Negative for rash.  Neurological:  Negative for dizziness, tingling, focal weakness, seizures, weakness and headaches.  Endo/Heme/Allergies:  Does not bruise/bleed easily.  Psychiatric/Behavioral:  Negative for depression and suicidal ideas. The patient does not have insomnia.       Allergies  Allergen Reactions   Amitriptyline Hives   Amoxicillin     Diarrhea     Black Pepper [Piper]    Ivp Dye [Iodinated Diagnostic Agents]    Tape Other (See Comments)    Whelps - please use paper tape.   Sulfa Antibiotics Rash   Sulfa Antibiotics Rash     Past Medical History:  Diagnosis Date   Anxiety    Arthritis    BOTH FEET  AND KNEES   Breast cancer ([place]) 2000   left breast ca with lumpectomy and rad tx 5 yr tamoxifen/Dr Jeannine Kitten at [facility]   Complication of anesthesia    WOKE UP DURING SURGERY FOR DEVIATED SEPTUM, KNEE REPLACEMENT AND BREAST LUMPECTOMY   Dysrhythmia     GERD (gastroesophageal reflux disease)    Hypertension    Neuropathic pain of both legs    Neuropathy    Personal history of chemotherapy    Personal history of radiation therapy    Pneumonia 2016   Skin cancer of forehead    Skin cancer of nose      Past Surgical History:  Procedure Laterality Date   ABDOMINAL HYSTERECTOMY     AXILLARY LYMPH NODE BIOPSY Left 07/10/2017   INVASIVE MAMMARY CARCINOMA WITH FOCAL LOBULAR FEATURES.    BREAST BIOPSY Bilateral 1960   benign   BREAST LUMPECTOMY Left 2000   breast ca with rad tx   BREAST LUMPECTOMY Left 08/20/2017   BREAST LUMPECTOMY Left 08/20/2017   Procedure: left breast wide excision;  Surgeon: Robert Bellow, MD;  Location: [facility] ORS;  Service: General;  Laterality: Left;   CATARACT EXTRACTION     left eye   CHOLECYSTECTOMY     JOINT REPLACEMENT     NASAL SEPTUM SURGERY     PARTIAL KNEE ARTHROPLASTY     right   TOTAL VAGINAL HYSTERECTOMY      Social History   Socioeconomic History   Marital status: Widowed    Spouse name: Not on file   Number of children: 3   Years of education: some college   Highest education level: 12th grade  Occupational History   Occupation: Retired  Tobacco Use   Smoking status: Never   Smokeless tobacco: Never   Tobacco comments:    smoking cessation materials not required  Vaping Use   Vaping Use: Never used  Substance and Sexual Activity   Alcohol use: No    Alcohol/week: 0.0 standard drinks   Drug use: No   Sexual activity: Not Currently  Other Topics Concern   Not on file  Social History Narrative   Patient lives in independent living at [place] Resource Strain: Low Risk    Difficulty of Paying Living Expenses: Not hard at all  Food Insecurity: No Food Insecurity   Worried About Charity fundraiser in the Last Year: Never true   Arboriculturist in the Last Year: Never true  Transportation Needs: Not on file  Physical  Activity: Inactive   Days of Exercise per Week: 0 days   Minutes of Exercise per Session: 0 min  Stress: No Stress Concern Present   Feeling of Stress : Not at all  Social Connections: Socially Isolated   Frequency of Communication with Friends and Family: More than three times a week   Frequency of Social Gatherings with Friends and Family: Three times a week   Attends Religious Services: Never   Active Member of Clubs or Organizations: No   Attends Archivist Meetings: Never   Marital Status: Widowed  Human resources officer Violence: Not At Risk   Fear of Current or Ex-Partner: No   Emotionally Abused: No   Physically Abused: No   Sexually Abused: No    Family History  Problem Relation Age of Onset   Heart disease Mother    Heart attack Paternal Uncle    Breast cancer  Daughter 61       genetic negative   Cancer Paternal Grandmother      Current Outpatient Medications:    acetaminophen (TYLENOL) 325 MG tablet, Take 650 mg by mouth every 6 (six) hours as needed., Disp: , Rfl:    aspirin 81 MG tablet, Take 1 tablet by mouth daily., Disp: , Rfl:    Calcium-Magnesium 500-250 MG TABS, Take 1 tablet by mouth daily. , Disp: , Rfl:    Cholecalciferol (VITAMIN D3) 25 MCG (1000 UT) CAPS, Take 1 capsule by mouth daily. , Disp: , Rfl:    Coenzyme Q10 (CO Q-10) 100 MG CAPS, Take 1 tablet by mouth daily. , Disp: , Rfl:    ezetimibe (ZETIA) 10 MG tablet, TAKE 1 TABLET BY MOUTH  DAILY, Disp: 90 tablet, Rfl: 0   gabapentin (NEURONTIN) 300 MG capsule, TAKE 3 CAPSULES BY MOUTH  TWICE DAILY, Disp: 540 capsule, Rfl: 0   letrozole (FEMARA) 2.5 MG tablet, Take 1 tablet (2.5 mg total) by mouth daily., Disp: 90 tablet, Rfl: 1   Liniments (ACE PAIN RELIEVING PATCH EX), Apply topically. Ameo, Disp: , Rfl:    lisinopril-hydrochlorothiazide (ZESTORETIC) 20-25 MG tablet, TAKE 1 AND 1/2 TABLETS BY  MOUTH DAILY, Disp: 135 tablet, Rfl: 3   meloxicam (MOBIC) 15 MG tablet, TAKE 1 TABLET BY MOUTH  DAILY,  Disp: 90 tablet, Rfl: 1   Multiple Vitamins-Minerals (MULTIVITAMIN WITH MINERALS) tablet, Take 1 tablet by mouth daily., Disp: , Rfl:    omeprazole (PRILOSEC) 20 MG capsule, TAKE 1 CAPSULE BY MOUTH  DAILY, Disp: 90 capsule, Rfl: 1   traMADol (ULTRAM) 50 MG tablet, TAKE 1/2 TO 1 TABLET BY  MOUTH EVERY 8 HOURS AS  NEEDED FOR PAIN (Patient taking differently: TAKE 1/2 TO 1 TABLET BY  MOUTH EVERY 8 HOURS AS  NEEDED FOR PAIN), Disp: 90 tablet, Rfl: 0   clindamycin (CLEOCIN) 300 MG capsule, Take 300 mg by mouth once. Prior to dental appointment (Patient not taking: No sig reported), Disp: , Rfl:   Physical exam:  Vitals:   08/28/21 1326  BP: (!) 148/65  Pulse: 60  Resp: 18  Temp: 98.8 F (37.1 C)  TempSrc: Tympanic  SpO2: 98%  Weight: 188 lb (85.3 kg)   Physical Exam Constitutional:      General: She is not in acute distress.    Comments: Ambulates with a walker.  Appears in no acute distress  Cardiovascular:     Rate and Rhythm: Normal rate and regular rhythm.     Heart sounds: Normal heart sounds.  Pulmonary:     Effort: Pulmonary effort is normal.     Breath sounds: Normal breath sounds.  Abdominal:     General: Bowel sounds are normal.     Palpations: Abdomen is soft.  Skin:    General: Skin is warm and dry.  Neurological:     Mental Status: She is alert and oriented to person, place, and time.     CMP Latest Ref Rng & Units 08/28/2021  Glucose 70 - 99 mg/dL 105(H)  BUN 8 - 23 mg/dL 25(H)  Creatinine 0.44 - 1.00 mg/dL 0.96  Sodium 135 - 145 mmol/L 136  Potassium 3.5 - 5.1 mmol/L 3.8  Chloride 98 - 111 mmol/L 97(L)  CO2 22 - 32 mmol/L 29  Calcium 8.9 - 10.3 mg/dL 9.4  Total Protein 6.5 - 8.1 g/dL 7.4  Total Bilirubin 0.3 - 1.2 mg/dL 0.5  Alkaline Phos 38 - 126 U/L 73  AST 15 -  41 U/L 20  ALT 0 - 44 U/L 11   CBC Latest Ref Rng & Units 08/28/2021  WBC 4.0 - 10.5 K/uL 6.5  Hemoglobin 12.0 - 15.0 g/dL 12.5  Hematocrit 36.0 - 46.0 % 37.6  Platelets 150 - 400 K/uL 209     Assessment and plan- Patient is a 87 y.o. female with ER positive breast cancer and bone metastases previously on Faslodex since 2018 and more recently switched to letrozole here for routine follow-up  Patient has baseline body aches and back pain which is remained unchanged regardless of letrozole.  Otherwise tolerating letrozole well without any other side effects.  She had similar complaints when she was on Faslodex as well.  At least for now she does not have to come for monthly injections and continue with oral medications.  I will plan to get  CT chest abdomen and pelvis with contrast as well as a bone scan in December 2022.  She is also scheduled for a repeat bone density scan next month.  Patient knows to take calcium and vitamin D along with letrozole.    I will see her back in December with CBC with differential CMP and CA 2729 following her CT scan  Patient does have stable bone mets but has been off bisphosphonates for a while  Visit Diagnosis 1. Malignant neoplasm of upper-outer quadrant of left breast in female, estrogen receptor positive ([place])   2. Osteopenia of neck of right femur      Dr. Randa Evens, MD, MPH [facility] at [facility] [phone] 08/29/2021 8:59 AM

## 2021-08-30 ENCOUNTER — Encounter: Payer: Self-pay | Admitting: Hematology and Oncology

## 2021-09-07 ENCOUNTER — Encounter: Payer: Self-pay | Admitting: Hematology and Oncology

## 2021-09-17 ENCOUNTER — Ambulatory Visit
Admission: RE | Admit: 2021-09-17 | Discharge: 2021-09-17 | Disposition: A | Discharge status: Home / Self Care | Payer: Medicare Other | Source: Ambulatory Visit | Attending: Oncology | Admitting: Oncology

## 2021-09-17 ENCOUNTER — Other Ambulatory Visit: Payer: Self-pay

## 2021-09-17 DIAGNOSIS — C7951 Secondary malignant neoplasm of bone: Secondary | ICD-10-CM | POA: Diagnosis not present

## 2021-09-17 DIAGNOSIS — C50919 Malignant neoplasm of unspecified site of unspecified female breast: Secondary | ICD-10-CM

## 2021-09-17 DIAGNOSIS — Z780 Asymptomatic menopausal state: Secondary | ICD-10-CM | POA: Diagnosis not present

## 2021-09-17 DIAGNOSIS — M85832 Other specified disorders of bone density and structure, left forearm: Secondary | ICD-10-CM | POA: Diagnosis not present

## 2021-09-17 DIAGNOSIS — M8589 Other specified disorders of bone density and structure, multiple sites: Secondary | ICD-10-CM | POA: Insufficient documentation

## 2021-09-17 DIAGNOSIS — Z9221 Personal history of antineoplastic chemotherapy: Secondary | ICD-10-CM | POA: Diagnosis not present

## 2021-09-17 DIAGNOSIS — Z923 Personal history of irradiation: Secondary | ICD-10-CM | POA: Insufficient documentation

## 2021-09-17 DIAGNOSIS — Z13820 Encounter for screening for osteoporosis: Secondary | ICD-10-CM | POA: Diagnosis not present

## 2021-09-17 DIAGNOSIS — M85851 Other specified disorders of bone density and structure, right thigh: Secondary | ICD-10-CM | POA: Diagnosis not present

## 2021-09-18 ENCOUNTER — Other Ambulatory Visit: Payer: Self-pay | Admitting: Internal Medicine

## 2021-09-18 ENCOUNTER — Other Ambulatory Visit: Payer: Self-pay | Admitting: *Deleted

## 2021-09-18 ENCOUNTER — Other Ambulatory Visit: Payer: Self-pay | Admitting: Cardiovascular Disease

## 2021-09-18 DIAGNOSIS — G5793 Unspecified mononeuropathy of bilateral lower limbs: Secondary | ICD-10-CM

## 2021-09-18 DIAGNOSIS — C50919 Malignant neoplasm of unspecified site of unspecified female breast: Secondary | ICD-10-CM

## 2021-09-18 DIAGNOSIS — M85851 Other specified disorders of bone density and structure, right thigh: Secondary | ICD-10-CM

## 2021-09-19 NOTE — Telephone Encounter (Signed)
Patient will need an office visit for further refills. Requested Prescriptions  Pending Prescriptions Disp Refills  . gabapentin (NEURONTIN) 300 MG capsule [Pharmacy Med Name: Gabapentin 300 MG Oral Capsule] 180 capsule 0    Sig: TAKE 3 CAPSULES BY MOUTH  TWICE DAILY     Neurology: Anticonvulsants - gabapentin Passed - 09/18/2021 10:56 PM      Passed - Valid encounter within last 12 months    Recent Outpatient Visits          5 months ago Knee swelling   [facility] Glean Hess, MD   1 year ago Essential hypertension   [facility] Glean Hess, MD   1 year ago Essential hypertension   [facility] Glean Hess, MD   2 years ago Essential hypertension   [facility] Glean Hess, MD   2 years ago Essential hypertension   [facility] Glean Hess, MD

## 2021-10-04 ENCOUNTER — Other Ambulatory Visit: Payer: Self-pay | Admitting: Internal Medicine

## 2021-10-04 DIAGNOSIS — K589 Irritable bowel syndrome without diarrhea: Secondary | ICD-10-CM

## 2021-10-05 ENCOUNTER — Other Ambulatory Visit: Payer: Self-pay | Admitting: Internal Medicine

## 2021-10-05 DIAGNOSIS — G5793 Unspecified mononeuropathy of bilateral lower limbs: Secondary | ICD-10-CM

## 2021-10-05 NOTE — Telephone Encounter (Signed)
Requested Prescriptions  Pending Prescriptions Disp Refills  . meloxicam (MOBIC) 15 MG tablet [Pharmacy Med Name: Meloxicam 15 MG Oral Tablet] 90 tablet 0    Sig: TAKE 1 TABLET BY MOUTH  DAILY     Analgesics:  COX2 Inhibitors Passed - 10/05/2021  5:00 AM      Passed - HGB in normal range and within 360 days    Hemoglobin  Date Value Ref Range Status  08/28/2021 12.5 12.0 - 15.0 g/dL Final  06/27/2017 13.1 11.1 - 15.9 g/dL Final         Passed - Cr in normal range and within 360 days    Creatinine  Date Value Ref Range Status  02/06/2012 0.76 0.60 - 1.30 mg/dL Final   Creatinine, Ser  Date Value Ref Range Status  08/28/2021 0.96 0.44 - 1.00 mg/dL Final         Passed - Patient is not pregnant      Passed - Valid encounter within last 12 months    Recent Outpatient Visits          5 months ago Knee swelling   [facility] Glean Hess, MD   1 year ago Essential hypertension   [facility] Glean Hess, MD   1 year ago Essential hypertension   [facility] Glean Hess, MD   2 years ago Essential hypertension   [facility] Glean Hess, MD   2 years ago Essential hypertension   [facility] Glean Hess, MD

## 2021-10-05 NOTE — Telephone Encounter (Signed)
Requested Prescriptions  Pending Prescriptions Disp Refills  . omeprazole (PRILOSEC) 20 MG capsule [Pharmacy Med Name: Omeprazole 20 MG Oral Capsule Delayed Release] 90 capsule 0    Sig: TAKE 1 CAPSULE BY MOUTH  DAILY     Gastroenterology: Proton Pump Inhibitors Passed - 10/04/2021 11:37 PM      Passed - Valid encounter within last 12 months    Recent Outpatient Visits          5 months ago Knee swelling   [facility] Glean Hess, MD   1 year ago Essential hypertension   [facility] Glean Hess, MD   1 year ago Essential hypertension   [facility] Glean Hess, MD   2 years ago Essential hypertension   [facility] Glean Hess, MD   2 years ago Essential hypertension   [facility] Glean Hess, MD

## 2021-10-15 DIAGNOSIS — Z961 Presence of intraocular lens: Secondary | ICD-10-CM | POA: Diagnosis not present

## 2021-10-22 ENCOUNTER — Ambulatory Visit (INDEPENDENT_AMBULATORY_CARE_PROVIDER_SITE_OTHER): Payer: Medicare Other

## 2021-10-22 DIAGNOSIS — Z0000 Encounter for general adult medical examination without abnormal findings: Secondary | ICD-10-CM

## 2021-10-22 NOTE — Patient Instructions (Signed)
Kelli Williams , Thank you for taking time to come for your Medicare Wellness Visit. I appreciate your ongoing commitment to your health goals. Please review the following plan we discussed and let me know if I can assist you in the future.   Screening recommendations/referrals: Colonoscopy: no longer required Mammogram: no longer required  Bone Density: done 09/17/21 Recommended yearly ophthalmology/optometry visit for glaucoma screening and checkup Recommended yearly dental visit for hygiene and checkup  Vaccinations: Influenza vaccine: done 10/01/21 Pneumococcal vaccine: done 2015 Tdap vaccine: due Shingles vaccine: Shingrix discussed. Please contact your pharmacy for coverage information.  Covid-19: done 12/16/19, 01/10/20, 11/08/20 & 10/01/21  Advanced directives: Advance directive discussed with you today. I have provided a copy for you to complete at home and have notarized. Once this is complete please bring a copy in to our office so we can scan it into your chart.   Conditions/risks identified: Recommend continuing fall prevention in the home  Next appointment: Follow up in one year for your annual wellness visit    Preventive Care 65 Years and Older, Female Preventive care refers to lifestyle choices and visits with your health care provider that can promote health and wellness. What does preventive care include? A yearly physical exam. This is also called an annual well check. Dental exams once or twice a year. Routine eye exams. Ask your health care provider how often you should have your eyes checked. Personal lifestyle choices, including: Daily care of your teeth and gums. Regular physical activity. Eating a healthy diet. Avoiding tobacco and drug use. Limiting alcohol use. Practicing safe sex. Taking low-dose aspirin every day. Taking vitamin and mineral supplements as recommended by your health care provider. What happens during an annual well check? The services and  screenings done by your health care provider during your annual well check will depend on your age, overall health, lifestyle risk factors, and family history of disease. Counseling  Your health care provider may ask you questions about your: Alcohol use. Tobacco use. Drug use. Emotional well-being. Home and relationship well-being. Sexual activity. Eating habits. History of falls. Memory and ability to understand (cognition). Work and work Statistician. Reproductive health. Screening  You may have the following tests or measurements: Height, weight, and BMI. Blood pressure. Lipid and cholesterol levels. These may be checked every 5 years, or more frequently if you are over 38 years old. Skin check. Lung cancer screening. You may have this screening every year starting at age 73 if you have a 30-pack-year history of smoking and currently smoke or have quit within the past 15 years. Fecal occult blood test (FOBT) of the stool. You may have this test every year starting at age 39. Flexible sigmoidoscopy or colonoscopy. You may have a sigmoidoscopy every 5 years or a colonoscopy every 10 years starting at age 65. Hepatitis C blood test. Hepatitis B blood test. Sexually transmitted disease (STD) testing. Diabetes screening. This is done by checking your blood sugar (glucose) after you have not eaten for a while (fasting). You may have this done every 1-3 years. Bone density scan. This is done to screen for osteoporosis. You may have this done starting at age 80. Mammogram. This may be done every 1-2 years. Talk to your health care provider about how often you should have regular mammograms. Talk with your health care provider about your test results, treatment options, and if necessary, the need for more tests. Vaccines  Your health care provider may recommend certain vaccines, such as: Influenza  vaccine. This is recommended every year. Tetanus, diphtheria, and acellular pertussis (Tdap,  Td) vaccine. You may need a Td booster every 10 years. Zoster vaccine. You may need this after age 68. Pneumococcal 13-valent conjugate (PCV13) vaccine. One dose is recommended after age 1. Pneumococcal polysaccharide (PPSV23) vaccine. One dose is recommended after age 56. Talk to your health care provider about which screenings and vaccines you need and how often you need them. This information is not intended to replace advice given to you by your health care provider. Make sure you discuss any questions you have with your health care provider. Document Released: 12/22/2015 Document Revised: 08/14/2016 Document Reviewed: 09/26/2015 Elsevier Interactive Patient Education  2017 [place] Prevention in the Home Falls can cause injuries. They can happen to people of all ages. There are many things you can do to make your home safe and to help prevent falls. What can I do on the outside of my home? Regularly fix the edges of walkways and driveways and fix any cracks. Remove anything that might make you trip as you walk through a door, such as a raised step or threshold. Trim any bushes or trees on the path to your home. Use bright outdoor lighting. Clear any walking paths of anything that might make someone trip, such as rocks or tools. Regularly check to see if handrails are loose or broken. Make sure that both sides of any steps have handrails. Any raised decks and porches should have guardrails on the edges. Have any leaves, snow, or ice cleared regularly. Use sand or salt on walking paths during winter. Clean up any spills in your garage right away. This includes oil or grease spills. What can I do in the bathroom? Use night lights. Install grab bars by the toilet and in the tub and shower. Do not use towel bars as grab bars. Use non-skid mats or decals in the tub or shower. If you need to sit down in the shower, use a plastic, non-slip stool. Keep the floor dry. Clean up any  water that spills on the floor as soon as it happens. Remove soap buildup in the tub or shower regularly. Attach bath mats securely with double-sided non-slip rug tape. Do not have throw rugs and other things on the floor that can make you trip. What can I do in the bedroom? Use night lights. Make sure that you have a light by your bed that is easy to reach. Do not use any sheets or blankets that are too big for your bed. They should not hang down onto the floor. Have a firm chair that has side arms. You can use this for support while you get dressed. Do not have throw rugs and other things on the floor that can make you trip. What can I do in the kitchen? Clean up any spills right away. Avoid walking on wet floors. Keep items that you use a lot in easy-to-reach places. If you need to reach something above you, use a strong step stool that has a grab bar. Keep electrical cords out of the way. Do not use floor polish or wax that makes floors slippery. If you must use wax, use non-skid floor wax. Do not have throw rugs and other things on the floor that can make you trip. What can I do with my stairs? Do not leave any items on the stairs. Make sure that there are handrails on both sides of the stairs and use them. Fix  handrails that are broken or loose. Make sure that handrails are as long as the stairways. Check any carpeting to make sure that it is firmly attached to the stairs. Fix any carpet that is loose or worn. Avoid having throw rugs at the top or bottom of the stairs. If you do have throw rugs, attach them to the floor with carpet tape. Make sure that you have a light switch at the top of the stairs and the bottom of the stairs. If you do not have them, ask someone to add them for you. What else can I do to help prevent falls? Wear shoes that: Do not have high heels. Have rubber bottoms. Are comfortable and fit you well. Are closed at the toe. Do not wear sandals. If you use a  stepladder: Make sure that it is fully opened. Do not climb a closed stepladder. Make sure that both sides of the stepladder are locked into place. Ask someone to hold it for you, if possible. Clearly mark and make sure that you can see: Any grab bars or handrails. First and last steps. Where the edge of each step is. Use tools that help you move around (mobility aids) if they are needed. These include: Canes. Walkers. Scooters. Crutches. Turn on the lights when you go into a dark area. Replace any light bulbs as soon as they burn out. Set up your furniture so you have a clear path. Avoid moving your furniture around. If any of your floors are uneven, fix them. If there are any pets around you, be aware of where they are. Review your medicines with your doctor. Some medicines can make you feel dizzy. This can increase your chance of falling. Ask your doctor what other things that you can do to help prevent falls. This information is not intended to replace advice given to you by your health care provider. Make sure you discuss any questions you have with your health care provider. Document Released: 09/21/2009 Document Revised: 05/02/2016 Document Reviewed: 12/30/2014 Elsevier Interactive Patient Education  2017 [employer].

## 2021-10-22 NOTE — Progress Notes (Signed)
Subjective:   Kelli Williams is a 87 y.o. female who presents for Medicare Annual (Subsequent) preventive examination.  Virtual Visit via Telephone Note  I connected with  Kelli Williams on 10/22/21 at  2:40 PM EST by telephone and verified that I am speaking with the correct person using two identifiers.  Location: Patient: home Provider: [facility] Persons participating in the virtual visit: [place]   I discussed the limitations, risks, security and privacy concerns of performing an evaluation and management service by telephone and the availability of in person appointments. The patient expressed understanding and agreed to proceed.  Interactive audio and video telecommunications were attempted between this nurse and patient, however failed, due to patient having technical difficulties OR patient did not have access to video capability.  We continued and completed visit with audio only.  Some vital signs may be absent or patient reported.   Clemetine Marker, LPN   Review of Systems     Cardiac Risk Factors include: advanced age (>29men, >8 women);dyslipidemia;hypertension;obesity (BMI >30kg/m2)     Objective:    There were no vitals filed for this visit. There is no height or weight on file to calculate BMI.  Advanced Directives 10/22/2021 08/28/2021 06/25/2021 11/27/2020 10/30/2020 10/18/2020 07/27/2020  Does Patient Have a Medical Advance Directive? Yes Yes No Yes Yes Yes Yes  Type of Advance Directive - [place];Living will - [place];Living will [place];Living will [place];Living will [place];Living will  Does patient want to make changes to medical advance directive? Yes (MAU/Ambulatory/Procedural Areas - Information given) - - Yes (MAU/Ambulatory/Procedural Areas - Information given) - - Yes (MAU/Ambulatory/Procedural Areas - Information given)  Copy of  [place] in Chart? - - - - No - copy requested No - copy requested -  Would patient like information on creating a medical advance directive? - - - - - - -    Current Medications (verified) Outpatient Encounter Medications as of 10/22/2021  Medication Sig   acetaminophen (TYLENOL) 325 MG tablet Take 650 mg by mouth every 6 (six) hours as needed.   aspirin 81 MG tablet Take 1 tablet by mouth daily.   Calcium-Magnesium 500-250 MG TABS Take 1 tablet by mouth daily.    Cholecalciferol (VITAMIN D3) 25 MCG (1000 UT) CAPS Take 1 capsule by mouth daily.    Coenzyme Q10 (CO Q-10) 100 MG CAPS Take 1 tablet by mouth daily.    ezetimibe (ZETIA) 10 MG tablet Take 1 tablet (10 mg total) by mouth daily. PLEASE SCHEDULE OFFICE VISIT FOR FURTHER REFILLS. THANK YOU!   gabapentin (NEURONTIN) 300 MG capsule TAKE 3 CAPSULES BY MOUTH  TWICE DAILY   letrozole (FEMARA) 2.5 MG tablet Take 1 tablet (2.5 mg total) by mouth daily.   Liniments (ACE PAIN RELIEVING PATCH EX) Apply topically. Ameo   lisinopril-hydrochlorothiazide (ZESTORETIC) 20-25 MG tablet TAKE 1 AND 1/2 TABLETS BY  MOUTH DAILY   meloxicam (MOBIC) 15 MG tablet TAKE 1 TABLET BY MOUTH  DAILY   Multiple Vitamins-Minerals (MULTIVITAMIN WITH MINERALS) tablet Take 1 tablet by mouth daily.   omeprazole (PRILOSEC) 20 MG capsule TAKE 1 CAPSULE BY MOUTH  DAILY   traMADol (ULTRAM) 50 MG tablet TAKE 1/2 TO 1 TABLET BY  MOUTH EVERY 8 HOURS AS  NEEDED FOR PAIN (Patient taking differently: TAKE 1/2 TO 1 TABLET BY  MOUTH EVERY 8 HOURS AS  NEEDED FOR PAIN)   [DISCONTINUED] clindamycin (  CLEOCIN) 300 MG capsule Take 300 mg by mouth once. Prior to dental appointment (Patient not taking: No sig reported)   No facility-administered encounter medications on file as of 10/22/2021.    Allergies (verified) Amitriptyline, Amoxicillin, Black pepper [piper], Ivp dye [iodinated diagnostic agents], Tape, and Sulfa antibiotics   History: Past Medical History:   Diagnosis Date   Anxiety    Arthritis    BOTH FEET AND KNEES   Breast cancer ([place]) 2000   left breast ca with lumpectomy and rad tx 5 yr tamoxifen/Dr Jeannine Kitten [facility]   Complication of anesthesia    WOKE UP DURING SURGERY FOR DEVIATED SEPTUM, KNEE REPLACEMENT AND BREAST LUMPECTOMY   Dysrhythmia    GERD (gastroesophageal reflux disease)    Hypertension    Neuropathic pain of both legs    Neuropathy    Personal history of chemotherapy    Personal history of radiation therapy    Pneumonia 2016   Skin cancer of forehead    Skin cancer of nose    Past Surgical History:  Procedure Laterality Date   ABDOMINAL HYSTERECTOMY     AXILLARY LYMPH NODE BIOPSY Left 07/10/2017   INVASIVE MAMMARY CARCINOMA WITH FOCAL LOBULAR FEATURES.    BREAST BIOPSY Bilateral 1960   benign   BREAST LUMPECTOMY Left 2000   breast ca with rad tx   BREAST LUMPECTOMY Left 08/20/2017   BREAST LUMPECTOMY Left 08/20/2017   Procedure: left breast wide excision;  Surgeon: Robert Bellow, MD;  Location: [facility] ORS;  Service: General;  Laterality: Left;   CATARACT EXTRACTION     left eye   CHOLECYSTECTOMY     JOINT REPLACEMENT     NASAL SEPTUM SURGERY     PARTIAL KNEE ARTHROPLASTY     right   TOTAL VAGINAL HYSTERECTOMY     Family History  Problem Relation Age of Onset   Heart disease Mother    Heart attack Paternal Uncle    Breast cancer Daughter 66       genetic negative   Cancer Paternal Grandmother    Social History   Socioeconomic History   Marital status: Widowed    Spouse name: Not on file   Number of children: 3   Years of education: some college   Highest education level: 12th grade  Occupational History   Occupation: Retired  Tobacco Use   Smoking status: Never   Smokeless tobacco: Never   Tobacco comments:    smoking cessation materials not required  Vaping Use   Vaping Use: Never used  Substance and Sexual Activity   Alcohol use: No    Alcohol/week: 0.0 standard drinks   Drug use:  No   Sexual activity: Not Currently  Other Topics Concern   Not on file  Social History Narrative   Patient lives in independent living at [facility] Strain: Low Risk    Difficulty of Paying Living Expenses: Not hard at all  Food Insecurity: No Food Insecurity   Worried About Charity fundraiser in the Last Year: Never true   Arboriculturist in the Last Year: Never true  Transportation Needs: No Transportation Needs   Lack of Transportation (Medical): No   Lack of Transportation (Non-Medical): No  Physical Activity: Inactive   Days of Exercise per Week: 0 days   Minutes of Exercise per Session: 0 min  Stress: No Stress Concern Present   Feeling of  Stress : Not at all  Social Connections: Socially Isolated   Frequency of Communication with Friends and Family: More than three times a week   Frequency of Social Gatherings with Friends and Family: Three times a week   Attends Religious Services: Never   Active Member of Clubs or Organizations: No   Attends Archivist Meetings: Never   Marital Status: Widowed    Tobacco Counseling Counseling given: Not Answered Tobacco comments: smoking cessation materials not required   Clinical Intake:  Pre-visit preparation completed: Yes  Pain : No/denies pain     Nutritional Risks: None Diabetes: No  How often do you need to have someone help you when you read instructions, pamphlets, or other written materials from your doctor or pharmacy?: 1 - Never    Interpreter Needed?: No  Information entered by :: Clemetine Marker LPN   Activities of Daily Living In your present state of health, do you have any difficulty performing the following activities: 10/22/2021 04/11/2021  Hearing? N Y  Vision? N Y  Difficulty concentrating or making decisions? N N  Walking or climbing stairs? Y Y  Dressing or bathing? N Y  Doing errands, shopping? Y Y  Comment does not drive Scientist, product/process development and eating ? N -  Using the Toilet? N -  In the past six months, have you accidently leaked urine? Y -  Comment wears pads for protection -  Do you have problems with loss of bowel control? N -  Managing your Medications? N -  Managing your Finances? N -  Housekeeping or managing your Housekeeping? N -  Some recent data might be hidden    Patient Care Team: Glean Hess, MD as PCP - General (Internal Medicine) [name], MD as Consulting Physician (Cardiology) Renata Caprice as Physician Assistant (Orthopedic Surgery) Bary Castilla Forest Gleason, MD (General Surgery) Hessie Knows, MD as Consulting Physician (Orthopedic Surgery) Lequita Asal, MD (Inactive) as Consulting Physician (Hematology and Oncology) Sindy Guadeloupe, MD as Consulting Physician (Hematology and Oncology)  Indicate any recent Medical Services you may have received from other than [facility] providers in the past year (date may be approximate).     Assessment:   This is a routine wellness examination for Kelli Williams.  Hearing/Vision screen Hearing Screening - Comments:: Pt denies hearing difficulty Vision Screening - Comments:: [facility] screen[facility] Dr. Thomasene Ripple  Dietary issues and exercise activities discussed:     Goals Addressed             This Visit's Progress    DIET - INCREASE WATER INTAKE   Not on track    Recommend to drink at least 6-8 8oz glasses of water per day.       Depression Screen PHQ 2/9 Scores 10/22/2021 04/11/2021 10/18/2020 06/21/2020 12/23/2019 10/13/2019 06/21/2019  PHQ - 2 Score 0 0 0 0 0 0 0  PHQ- 9 Score - 0 - 0 2 - 0    Fall Risk Fall Risk  10/22/2021 04/11/2021 06/21/2020 10/13/2019 06/21/2019  Falls in the past year? 1 1 0 0 0  Comment - - - - -  Number falls in past yr: 0 1 0 0 1  Injury with Fall? 0 1 0 0 0  Risk Factor Category  - - - - -  Risk for fall due to : No Fall Risks History of fall(s);Impaired  balance/gait No Fall Risks Impaired balance/gait;Impaired mobility History of fall(s);Impaired balance/gait;Impaired  mobility;Impaired vision  Risk for fall due to: Comment - - - - -  Follow up Falls prevention discussed Falls evaluation completed Falls evaluation completed Falls prevention discussed Falls evaluation completed;Falls prevention discussed    FALL RISK PREVENTION PERTAINING TO THE HOME:  Any stairs in or around the home? No  If so, are there any without handrails? No  Home free of loose throw rugs in walkways, pet beds, electrical cords, etc? Yes  Adequate lighting in your home to reduce risk of falls? Yes   ASSISTIVE DEVICES UTILIZED TO PREVENT FALLS:  Life alert? yes Use of a cane, walker or w/c? Yes  Grab bars in the bathroom? Yes  Shower chair or bench in shower? Yes  Elevated toilet seat or a handicapped toilet? No   TIMED UP AND GO:  Was the test performed? No . Telephonic visit.   Cognitive Function: Normal cognitive status assessed by direct observation by this Nurse Health Advisor. No abnormalities found.       6CIT Screen 10/13/2019 06/29/2018 06/27/2017  What Year? 0 points 0 points 0 points  What month? 0 points 0 points 0 points  What time? 0 points 0 points 0 points  Count back from 20 0 points 0 points 0 points  Months in reverse 0 points 0 points 0 points  Repeat phrase 0 points 0 points 2 points  Total Score 0 0 2    Immunizations Immunization History  Administered Date(s) Administered   Fluad Quad(high Dose 65+) 10/12/2019, 09/25/2020   Influenza, High Dose Seasonal PF 09/23/2017, 09/10/2018, 10/01/2021   Influenza,inj,Quad PF,6+ Mos 10/16/2016   Influenza-Unspecified 09/02/2015   Moderna Covid-19 Vaccine Bivalent Booster 19yrs & up 10/01/2021   PFIZER(Purple Top)SARS-COV-2 Vaccination 12/16/2019, 01/10/2020, 11/08/2020   Pneumococcal Conjugate-13 09/08/2014   Pneumococcal Polysaccharide-23 09/14/1994, 04/30/1997   Pneumococcal-Unspecified  09/14/1994   Tdap 06/09/2011   Zoster, Live 05/12/2006    TDAP status: Due, Education has been provided regarding the importance of this vaccine. Advised may receive this vaccine at local pharmacy or Health Dept. Aware to provide a copy of the vaccination record if obtained from local pharmacy or Health Dept. Verbalized acceptance and understanding.  Flu Vaccine status: Up to date  Pneumococcal vaccine status: Up to date  Covid-19 vaccine status: Completed vaccines  Qualifies for Shingles Vaccine? Yes   Zostavax completed Yes   Shingrix Completed?: No.    Education has been provided regarding the importance of this vaccine. Patient has been advised to call insurance company to determine out of pocket expense if they have not yet received this vaccine. Advised may also receive vaccine at local pharmacy or Health Dept. Verbalized acceptance and understanding.  Screening Tests Health Maintenance  Topic Date Due   Zoster Vaccines- Shingrix (1 of 2) Never done   TETANUS/TDAP  06/08/2021   Pneumonia Vaccine 40+ Years old  Completed   INFLUENZA VACCINE  Completed   DEXA SCAN  Completed   COVID-19 Vaccine  Completed   HPV VACCINES  Aged Out    Health Maintenance  Health Maintenance Due  Topic Date Due   Zoster Vaccines- Shingrix (1 of 2) Never done   TETANUS/TDAP  06/08/2021    Colorectal cancer screening: No longer required.   Mammogram status: No longer required due to age.  Bone Density status: Completed 09/17/21. Results reflect: Bone density results: OSTEOPENIA. Repeat every 2 years.  Lung Cancer Screening: (Low Dose CT Chest recommended if Age 24-80 years, 30 pack-year currently smoking OR have quit w/in 15years.)  does not qualify.   Additional Screening:  Hepatitis C Screening: does not qualify  Vision Screening: Recommended annual ophthalmology exams for early detection of glaucoma and other disorders of the eye. Is the patient up to date with their annual eye exam?   Yes  Who is the provider or what is the name of the office in which the patient attends annual eye exams? [facility].   Dental Screening: Recommended annual dental exams for proper oral hygiene  Community Resource Referral / Chronic Care Management: CRR required this visit?  No   CCM required this visit?  No      Plan:     I have personally reviewed and noted the following in the patient's chart:   Medical and social history Use of alcohol, tobacco or illicit drugs  Current medications and supplements including opioid prescriptions.  Functional ability and status Nutritional status Physical activity Advanced directives List of other physicians Hospitalizations, surgeries, and ER visits in previous 12 months Vitals Screenings to include cognitive, depression, and falls Referrals and appointments  In addition, I have reviewed and discussed with patient certain preventive protocols, quality metrics, and best practice recommendations. A written personalized care plan for preventive services as well as general preventive health recommendations were provided to patient.     Clemetine Marker, LPN   [id number]   Nurse Notes: none

## 2021-10-26 ENCOUNTER — Other Ambulatory Visit: Payer: Self-pay | Admitting: Internal Medicine

## 2021-10-26 DIAGNOSIS — G5793 Unspecified mononeuropathy of bilateral lower limbs: Secondary | ICD-10-CM

## 2021-10-27 NOTE — Telephone Encounter (Signed)
Requested medications are due for refill today yes  Requested medications are on the active medication list yes  Last refill 10/12/21 (mail order for 30 day supply)  Future visit scheduled 10/23/22 (medicare wellness)  Notes to clinic Has already had a curtesy refill and there is no upcoming appointment scheduled. Requested Prescriptions  Pending Prescriptions Disp Refills   gabapentin (NEURONTIN) 300 MG capsule [Pharmacy Med Name: Gabapentin 300 MG Oral Capsule] 180 capsule 11    Sig: TAKE 3 CAPSULES BY MOUTH  TWICE DAILY     Neurology: Anticonvulsants - gabapentin Passed - 10/26/2021 10:47 PM      Passed - Valid encounter within last 12 months    Recent Outpatient Visits           6 months ago Knee swelling   [facility] Glean Hess, MD   1 year ago Essential hypertension   [facility] Glean Hess, MD   1 year ago Essential hypertension   [facility] Glean Hess, MD   2 years ago Essential hypertension   [facility] Glean Hess, MD   2 years ago Essential hypertension   [facility] Glean Hess, MD

## 2021-11-07 ENCOUNTER — Ambulatory Visit: Payer: Self-pay | Admitting: *Deleted

## 2021-11-07 NOTE — Telephone Encounter (Signed)
Patient was inform to go to the ER and suggested that she does not think she's that sick and she also mention that she did not have transportation I said to her to call EMS to get her transported for her symptoms.

## 2021-11-07 NOTE — Telephone Encounter (Addendum)
Pt reports cough, onset Sunday, worsening yesterday. States productive for dark yellowish phlegm. States noted some "Blood flecks in it this morning." Also reports frontal headache, sinus, facial pain. States "Nose real stuffy."  Denies SOB, no CP, no sore throat or body aches. States may have had a LGT last night, "Thermometer not working. Denies sweating. No chills.  Denies wheezing. HAs taken tylenol, no other OTC meds. Is going to home test for covid.  No appts available within protocol timeframe. Pt states does not have transportation, is requesting something be called in for her. Advised may need tele appt. Assured pt NT would route to practice for PCPs review and final disposition. Care advise given, pt verbalizes understanding.  If prescription appropriate, [facility], pt states they will deliver. Pt called during lunch hour. Please advise: [phone]       Answer Assessment - Initial Assessment Questions 1. ONSET: "When did the cough begin?"        Sunday        Worsening yesterday 2. SEVERITY: "How bad is the cough today?"      Spells during day 3. SPUTUM: "Describe the color of your sputum" (none, dry cough; clear, white, yellow, green)     [place] yellowish 4. HEMOPTYSIS: "Are you coughing up any blood?" If so ask: "How much?" (flecks, streaks, tablespoons, etc.)     Bright red flecks 5. DIFFICULTY BREATHING: "Are you having difficulty breathing?" If Yes, ask: "How bad is it?" (e.g., mild, moderate, severe)    - MILD: No SOB at rest, mild SOB with walking, speaks normally in sentences, can lie down, no retractions, pulse < 100.    - MODERATE: SOB at rest, SOB with minimal exertion and prefers to sit, cannot lie down flat, speaks in phrases, mild retractions, audible wheezing, pulse 100-120.    - SEVERE: Very SOB at rest, speaks in single words, struggling to breathe, sitting hunched forward, retractions, pulse > 120      No 6. FEVER: "Do you have a fever?" If Yes, ask:  "What is your temperature, how was it measured, and when did it start?"     Possibly last might 7. CARDIAC HISTORY: "Do you have any history of heart disease?" (e.g., heart attack, congestive heart failure)      *No Answer* 8. LUNG HISTORY: "Do you have any history of lung disease?"  (e.g., pulmonary embolus, asthma, emphysema)     *No Answer* 9. PE RISK FACTORS: "Do you have a history of blood clots?" (or: recent major surgery, recent prolonged travel, bedridden)     *No Answer* 10. OTHER SYMPTOMS: "Do you have any other symptoms?" (e.g., runny nose, wheezing, chest pain)     Nose "Real stuffy"  Wheezing this AM, not presently, frontal headache, facial pain  Protocols used: Cough - Acute Productive-A-AH

## 2021-11-07 NOTE — Telephone Encounter (Incomplete Revision)
Pt reports cough, onset Sunday, worsening yesterday. States productive for dark yellowish phlegm. States noted some "Blood flecks in it this morning." Also reports frontal headache, sinus, facial pain. States "Nose real stuffy."  Denies SOB, no CP, no sore throat or body aches. States may have had a LGT last night, "Thermometer not working. Denies sweating. No chills.  Denies wheezing. HAs taken tylenol, no other OTC meds. Is going to home test for covid.  No appts available within protocol timeframe. Pt states does not have transportation, is requesting something be called in for her. Advised may need tele appt. Assured pt NT would route to practice for PCPs review and final disposition. Care advise given, pt verbalizes understanding.  If prescription appropriate, [facility], pt states they will deliver. Pt called during lunch hour. Please advise: [phone]       Answer Assessment - Initial Assessment Questions 1. ONSET: "When did the cough begin?"        Sunday        Worsening yesterday 2. SEVERITY: "How bad is the cough today?"      Spells during day 3. SPUTUM: "Describe the color of your sputum" (none, dry cough; clear, white, yellow, green)     [place] yellowish 4. HEMOPTYSIS: "Are you coughing up any blood?" If so ask: "How much?" (flecks, streaks, tablespoons, etc.)     Bright red flecks 5. DIFFICULTY BREATHING: "Are you having difficulty breathing?" If Yes, ask: "How bad is it?" (e.g., mild, moderate, severe)    - MILD: No SOB at rest, mild SOB with walking, speaks normally in sentences, can lie down, no retractions, pulse < 100.    - MODERATE: SOB at rest, SOB with minimal exertion and prefers to sit, cannot lie down flat, speaks in phrases, mild retractions, audible wheezing, pulse 100-120.    - SEVERE: Very SOB at rest, speaks in single words, struggling to breathe, sitting hunched forward, retractions, pulse > 120      No 6. FEVER: "Do you have a fever?" If Yes, ask:  "What is your temperature, how was it measured, and when did it start?"     Possibly last might 7. CARDIAC HISTORY: "Do you have any history of heart disease?" (e.g., heart attack, congestive heart failure)      *No Answer* 8. LUNG HISTORY: "Do you have any history of lung disease?"  (e.g., pulmonary embolus, asthma, emphysema)     *No Answer* 9. PE RISK FACTORS: "Do you have a history of blood clots?" (or: recent major surgery, recent prolonged travel, bedridden)     *No Answer* 10. OTHER SYMPTOMS: "Do you have any other symptoms?" (e.g., runny nose, wheezing, chest pain)     Nose "Real stuffy"  Wheezing this AM, not presently, frontal headache, facial pain  Protocols used: Cough - Acute Productive-A-AH

## 2021-11-07 NOTE — Telephone Encounter (Deleted)
Error    Reason for Disposition  Coughing up rusty-colored (reddish-brown) sputum    States "Bright ref flecks"  Answer Assessment - Initial Assessment Questions 1. ONSET: "When did the cough begin?"      *** 2. SEVERITY: "How bad is the cough today?"      *** 3. SPUTUM: "Describe the color of your sputum" (none, dry cough; clear, white, yellow, green)     *** 4. HEMOPTYSIS: "Are you coughing up any blood?" If so ask: "How much?" (flecks, streaks, tablespoons, etc.)     *** 5. DIFFICULTY BREATHING: "Are you having difficulty breathing?" If Yes, ask: "How bad is it?" (e.g., mild, moderate, severe)    - MILD: No SOB at rest, mild SOB with walking, speaks normally in sentences, can lie down, no retractions, pulse < 100.    - MODERATE: SOB at rest, SOB with minimal exertion and prefers to sit, cannot lie down flat, speaks in phrases, mild retractions, audible wheezing, pulse 100-120.    - SEVERE: Very SOB at rest, speaks in single words, struggling to breathe, sitting hunched forward, retractions, pulse > 120      *** 6. FEVER: "Do you have a fever?" If Yes, ask: "What is your temperature, how was it measured, and when did it start?"     *** 7. CARDIAC HISTORY: "Do you have any history of heart disease?" (e.g., heart attack, congestive heart failure)      *** 8. LUNG HISTORY: "Do you have any history of lung disease?"  (e.g., pulmonary embolus, asthma, emphysema)     *** 9. PE RISK FACTORS: "Do you have a history of blood clots?" (or: recent major surgery, recent prolonged travel, bedridden)     *** 10. OTHER SYMPTOMS: "Do you have any other symptoms?" (e.g., runny nose, wheezing, chest pain)       *** 11. PREGNANCY: "Is there any chance you are pregnant?" "When was your last menstrual period?"       *** 12. TRAVEL: "Have you traveled out of the country in the last month?" (e.g., travel history, exposures)       ***  Protocols used: Cough - Acute Productive-A-AH

## 2021-11-08 ENCOUNTER — Telehealth: Payer: Self-pay | Admitting: *Deleted

## 2021-11-08 ENCOUNTER — Other Ambulatory Visit: Payer: Self-pay

## 2021-11-08 ENCOUNTER — Inpatient Hospital Stay: Payer: Medicare Other | Attending: Hospice and Palliative Medicine | Admitting: Hospice and Palliative Medicine

## 2021-11-08 DIAGNOSIS — J069 Acute upper respiratory infection, unspecified: Secondary | ICD-10-CM | POA: Diagnosis not present

## 2021-11-08 MED ORDER — GUAIFENESIN ER 600 MG PO TB12: 600.0000 mg | ORAL_TABLET | Freq: Two times a day (BID) | ORAL | 0 refills | Status: DC

## 2021-11-08 NOTE — Telephone Encounter (Signed)
Virtual [facility] appointment scheduled for 3pm. Pt aware of appointment time.

## 2021-11-08 NOTE — Telephone Encounter (Signed)
[facility] if possible would be great

## 2021-11-08 NOTE — Telephone Encounter (Signed)
Kelli Williams-does this patient need [facility] eval?

## 2021-11-08 NOTE — Telephone Encounter (Signed)
Patient called. Left vm on triage line. Requested Kelli Williams to return her phone call as she has questions about her upcoming apts with Dr. Janese Banks.

## 2021-11-08 NOTE — Progress Notes (Signed)
Virtual Visit via Video Note  I connected with Kelli Williams on 11/08/21 at  3:00 PM EST by a video enabled telemedicine application and verified that I am speaking with the correct person using two identifiers.  Location: Patient: Clinic Provider: Home   I discussed the limitations of evaluation and management by telemedicine and the availability of in person appointments. The patient expressed understanding and agreed to proceed.  History of Present Illness: Kelli Williams is a 87 year old woman with multiple medical problems including metastatic ER positive breast cancer status post adjuvant XRT and 5 years of tamoxifen with disease recurrence in August 2018.  Patient has been on letrozole since July 2022.   Observations/Objective: Due to technical difficulties, I spoke with patient by phone.  She reports the development of upper respiratory congestion/rhinitis/cough earlier this week.  She reached out to her PCP but was unable to be seen.  Patient was advised to seek ER care and to obtain a chest x-ray.  Patient has transportation limitations and opted to stay home.  She reports that her symptoms have been slowly improving all week.  She continues to have some nasal congestion and an occasional productive cough.  She denies shortness of breath, fever, or chills.  She took a home COVID test, which was negative.  Patient reports that she feels "significantly better" today.  Assessment and Plan: URI -given overall improvement in symptoms, I think it is reasonable to hold off on imaging at this point.  We did discuss triggers for utilization of ER or urgent care.  We discussed symptomatic care.  Unfortunately, patient does not have access to transportation to obtain OTC medications from her pharmacy.  Her pharmacy ([facility]) normally delivers her medications.  At patient's request, I sent a prescription for Mucinex but I am unsure if she will be able to access this from her pharmacy given that  it is over-the-counter.  Patient is pending repeat CTs on 12/12.  Follow Up Instructions: As needed   I discussed the assessment and treatment plan with the patient. The patient was provided an opportunity to ask questions and all were answered. The patient agreed with the plan and demonstrated an understanding of the instructions.   The patient was advised to call back or seek an in-person evaluation if the symptoms worsen or if the condition fails to improve as anticipated.  I provided 10 minutes of non-face-to-face time during this encounter.   Irean Hong, NP

## 2021-11-12 DIAGNOSIS — R300 Dysuria: Secondary | ICD-10-CM | POA: Diagnosis not present

## 2021-11-12 DIAGNOSIS — B9689 Other specified bacterial agents as the cause of diseases classified elsewhere: Secondary | ICD-10-CM | POA: Diagnosis not present

## 2021-11-12 DIAGNOSIS — R519 Headache, unspecified: Secondary | ICD-10-CM | POA: Diagnosis not present

## 2021-11-12 DIAGNOSIS — J019 Acute sinusitis, unspecified: Secondary | ICD-10-CM | POA: Diagnosis not present

## 2021-11-12 DIAGNOSIS — R051 Acute cough: Secondary | ICD-10-CM | POA: Diagnosis not present

## 2021-11-13 ENCOUNTER — Telehealth: Payer: Self-pay | Admitting: Oncology

## 2021-11-13 NOTE — Telephone Encounter (Signed)
Pt called and wants to reschedule her appt for the rest of the month. She is on an strong antibiotic. She wants Anderson Malta to give her a call at [phone]

## 2021-11-19 ENCOUNTER — Other Ambulatory Visit: Payer: Medicare Other

## 2021-11-19 ENCOUNTER — Ambulatory Visit: Payer: Medicare Other

## 2021-11-20 ENCOUNTER — Ambulatory Visit: Payer: Medicare Other

## 2021-11-27 ENCOUNTER — Ambulatory Visit
Admission: RE | Admit: 2021-11-27 | Discharge: 2021-11-27 | Disposition: A | Discharge status: Home / Self Care | Payer: Medicare Other | Source: Ambulatory Visit | Attending: Oncology | Admitting: Oncology

## 2021-11-27 ENCOUNTER — Other Ambulatory Visit: Payer: Self-pay

## 2021-11-27 DIAGNOSIS — K8689 Other specified diseases of pancreas: Secondary | ICD-10-CM | POA: Diagnosis not present

## 2021-11-27 DIAGNOSIS — C50919 Malignant neoplasm of unspecified site of unspecified female breast: Secondary | ICD-10-CM

## 2021-11-27 DIAGNOSIS — I700 Atherosclerosis of aorta: Secondary | ICD-10-CM | POA: Diagnosis not present

## 2021-11-27 DIAGNOSIS — J9811 Atelectasis: Secondary | ICD-10-CM | POA: Diagnosis not present

## 2021-11-27 DIAGNOSIS — J479 Bronchiectasis, uncomplicated: Secondary | ICD-10-CM | POA: Diagnosis not present

## 2021-11-27 DIAGNOSIS — M899 Disorder of bone, unspecified: Secondary | ICD-10-CM | POA: Diagnosis not present

## 2021-11-27 DIAGNOSIS — K449 Diaphragmatic hernia without obstruction or gangrene: Secondary | ICD-10-CM | POA: Diagnosis not present

## 2021-11-27 DIAGNOSIS — K429 Umbilical hernia without obstruction or gangrene: Secondary | ICD-10-CM | POA: Diagnosis not present

## 2021-11-27 DIAGNOSIS — C7951 Secondary malignant neoplasm of bone: Secondary | ICD-10-CM | POA: Diagnosis not present

## 2021-11-27 DIAGNOSIS — C50412 Malignant neoplasm of upper-outer quadrant of left female breast: Secondary | ICD-10-CM | POA: Diagnosis not present

## 2021-11-27 DIAGNOSIS — M85851 Other specified disorders of bone density and structure, right thigh: Secondary | ICD-10-CM | POA: Diagnosis not present

## 2021-12-04 ENCOUNTER — Inpatient Hospital Stay: Admission: RE | Admit: 2021-12-04 | Payer: Medicare Other | Source: Ambulatory Visit

## 2021-12-05 ENCOUNTER — Other Ambulatory Visit: Payer: Self-pay | Admitting: Oncology

## 2021-12-05 ENCOUNTER — Ambulatory Visit: Payer: Medicare Other | Admitting: Oncology

## 2021-12-06 ENCOUNTER — Inpatient Hospital Stay: Admission: RE | Admit: 2021-12-06 | Payer: Medicare Other | Source: Ambulatory Visit

## 2021-12-06 ENCOUNTER — Encounter: Admission: RE | Admit: 2021-12-06 | Payer: Medicare Other | Source: Ambulatory Visit

## 2021-12-06 ENCOUNTER — Telehealth: Payer: Self-pay | Admitting: Cardiovascular Disease

## 2021-12-06 ENCOUNTER — Other Ambulatory Visit: Payer: Medicare Other

## 2021-12-06 ENCOUNTER — Encounter: Payer: Self-pay | Admitting: Hematology and Oncology

## 2021-12-06 ENCOUNTER — Other Ambulatory Visit: Payer: Self-pay | Admitting: Internal Medicine

## 2021-12-06 DIAGNOSIS — G5793 Unspecified mononeuropathy of bilateral lower limbs: Secondary | ICD-10-CM

## 2021-12-06 DIAGNOSIS — K589 Irritable bowel syndrome without diarrhea: Secondary | ICD-10-CM

## 2021-12-07 ENCOUNTER — Telehealth: Payer: Self-pay | Admitting: Oncology

## 2021-12-07 NOTE — Telephone Encounter (Signed)
LVM to schedule appt

## 2021-12-07 NOTE — Telephone Encounter (Signed)
Requested Prescriptions  Pending Prescriptions Disp Refills   meloxicam (MOBIC) 15 MG tablet [Pharmacy Med Name: Meloxicam 15 MG Oral Tablet] 90 tablet 0    Sig: TAKE 1 TABLET BY MOUTH DAILY     Analgesics:  COX2 Inhibitors Passed - 12/06/2021 10:58 PM      Passed - HGB in normal range and within 360 days    Hemoglobin  Date Value Ref Range Status  08/28/2021 12.5 12.0 - 15.0 g/dL Final  06/27/2017 13.1 11.1 - 15.9 g/dL Final         Passed - Cr in normal range and within 360 days    Creatinine  Date Value Ref Range Status  02/06/2012 0.76 0.60 - 1.30 mg/dL Final   Creatinine, Ser  Date Value Ref Range Status  08/28/2021 0.96 0.44 - 1.00 mg/dL Final         Passed - Patient is not pregnant      Passed - Valid encounter within last 12 months    Recent Outpatient Visits          8 months ago Knee swelling   [facility] Glean Hess, MD   1 year ago Essential hypertension   [place], Laura H, MD   1 year ago Essential hypertension   [facility] Glean Hess, MD   2 years ago Essential hypertension   [facility] Glean Hess, MD   2 years ago Essential hypertension   [facility] Glean Hess, MD      Future Appointments            In 2 months Vigg, Avanti, MD [facility]            omeprazole (PRILOSEC) 20 MG capsule [Pharmacy Med Name: Omeprazole 20 MG Oral Capsule Delayed Release] 90 capsule 0    Sig: TAKE 1 CAPSULE BY MOUTH DAILY     Gastroenterology: Proton Pump Inhibitors Passed - 12/06/2021 10:58 PM      Passed - Valid encounter within last 12 months    Recent Outpatient Visits          8 months ago Knee swelling   [facility] Glean Hess, MD   1 year ago Essential hypertension   [place], Laura H, MD   1 year ago Essential hypertension   [facility] Glean Hess, MD   2 years ago Essential  hypertension   [facility] Glean Hess, MD   2 years ago Essential hypertension   [place], Laura H, MD      Future Appointments            In 2 months Vigg, Avanti, MD [facility]

## 2021-12-07 NOTE — Telephone Encounter (Signed)
Pt called to reschedule her appt for 1-9. Bone scan is on 1-10. Call back at [phone]

## 2021-12-07 NOTE — Telephone Encounter (Signed)
Please contact pt for future appointment. Pt overdue for 12 month f/u. Pt needing refills. 

## 2021-12-14 ENCOUNTER — Ambulatory Visit: Payer: Medicare Other | Admitting: Internal Medicine

## 2021-12-17 ENCOUNTER — Other Ambulatory Visit: Payer: Medicare Other

## 2021-12-17 ENCOUNTER — Ambulatory Visit: Payer: Medicare Other | Admitting: Oncology

## 2021-12-18 ENCOUNTER — Other Ambulatory Visit: Payer: Self-pay

## 2021-12-18 ENCOUNTER — Encounter
Admission: RE | Admit: 2021-12-18 | Discharge: 2021-12-18 | Disposition: A | Discharge status: Home / Self Care | Payer: Medicare Other | Source: Ambulatory Visit | Attending: Oncology | Admitting: Oncology

## 2021-12-18 DIAGNOSIS — C50412 Malignant neoplasm of upper-outer quadrant of left female breast: Secondary | ICD-10-CM | POA: Insufficient documentation

## 2021-12-18 DIAGNOSIS — M19012 Primary osteoarthritis, left shoulder: Secondary | ICD-10-CM | POA: Diagnosis not present

## 2021-12-18 DIAGNOSIS — M19011 Primary osteoarthritis, right shoulder: Secondary | ICD-10-CM | POA: Diagnosis not present

## 2021-12-18 DIAGNOSIS — Z170 Estrogen receptor positive status [ER+]: Secondary | ICD-10-CM

## 2021-12-18 DIAGNOSIS — M19072 Primary osteoarthritis, left ankle and foot: Secondary | ICD-10-CM | POA: Diagnosis not present

## 2021-12-18 DIAGNOSIS — C50919 Malignant neoplasm of unspecified site of unspecified female breast: Secondary | ICD-10-CM | POA: Diagnosis not present

## 2021-12-18 MED ORDER — TECHNETIUM TC 99M MEDRONATE IV KIT
20.0000 | PACK | Freq: Once | INTRAVENOUS | Status: AC | PRN
  Administered 2021-12-18: 21.6 via INTRAVENOUS

## 2021-12-19 ENCOUNTER — Other Ambulatory Visit: Payer: Self-pay | Admitting: *Deleted

## 2021-12-19 DIAGNOSIS — C50919 Malignant neoplasm of unspecified site of unspecified female breast: Secondary | ICD-10-CM

## 2021-12-21 ENCOUNTER — Encounter: Payer: Self-pay | Admitting: Hematology and Oncology

## 2021-12-21 MED ORDER — EZETIMIBE 10 MG PO TABS: 10.0000 mg | ORAL_TABLET | Freq: Every day | ORAL | 0 refills | Status: DC

## 2021-12-21 NOTE — Telephone Encounter (Signed)
Requested Prescriptions  ° °Signed Prescriptions Disp Refills  ° ezetimibe (ZETIA) 10 MG tablet 90 tablet 0  °  Sig: Take 1 tablet (10 mg total) by mouth daily.  °  Authorizing Provider: GOLLAN, TIMOTHY J  °  Ordering User: Soma Bachand C  ° ° °

## 2021-12-21 NOTE — Telephone Encounter (Signed)
error 

## 2021-12-21 NOTE — Telephone Encounter (Signed)
Patient scheduled.

## 2021-12-21 NOTE — Addendum Note (Signed)
Addended by: Britt Bottom on: 12/21/2021 04:36 PM   Modules accepted: Orders

## 2021-12-28 ENCOUNTER — Encounter: Payer: Self-pay | Admitting: Oncology

## 2021-12-28 ENCOUNTER — Other Ambulatory Visit: Payer: Self-pay

## 2021-12-28 ENCOUNTER — Inpatient Hospital Stay: Payer: Medicare Other | Attending: Hospice and Palliative Medicine

## 2021-12-28 ENCOUNTER — Inpatient Hospital Stay (HOSPITAL_BASED_OUTPATIENT_CLINIC_OR_DEPARTMENT_OTHER): Payer: Medicare Other | Admitting: Oncology

## 2021-12-28 VITALS — BP 162/80 | HR 63 | Temp 98.0°F | Resp 17 | Wt 185.0 lb

## 2021-12-28 DIAGNOSIS — C50919 Malignant neoplasm of unspecified site of unspecified female breast: Secondary | ICD-10-CM

## 2021-12-28 DIAGNOSIS — Z79811 Long term (current) use of aromatase inhibitors: Secondary | ICD-10-CM

## 2021-12-28 DIAGNOSIS — C7951 Secondary malignant neoplasm of bone: Secondary | ICD-10-CM | POA: Insufficient documentation

## 2021-12-28 DIAGNOSIS — C50912 Malignant neoplasm of unspecified site of left female breast: Secondary | ICD-10-CM | POA: Insufficient documentation

## 2021-12-28 DIAGNOSIS — M549 Dorsalgia, unspecified: Secondary | ICD-10-CM | POA: Diagnosis not present

## 2021-12-28 DIAGNOSIS — M170 Bilateral primary osteoarthritis of knee: Secondary | ICD-10-CM | POA: Diagnosis not present

## 2021-12-28 DIAGNOSIS — G8929 Other chronic pain: Secondary | ICD-10-CM | POA: Insufficient documentation

## 2021-12-28 LAB — COMPREHENSIVE METABOLIC PANEL
ALT: 11 U/L (ref 0–44)
AST: 18 U/L (ref 15–41)
Albumin: 3.9 g/dL (ref 3.5–5.0)
Alkaline Phosphatase: 79 U/L (ref 38–126)
Anion gap: 9 (ref 5–15)
BUN: 28 mg/dL — ABNORMAL HIGH (ref 8–23)
CO2: 29 mmol/L (ref 22–32)
Calcium: 8.9 mg/dL (ref 8.9–10.3)
Chloride: 97 mmol/L — ABNORMAL LOW (ref 98–111)
Creatinine, Ser: 0.97 mg/dL (ref 0.44–1.00)
GFR, Estimated: 54 mL/min — ABNORMAL LOW (ref >60)
Glucose, Bld: 107 mg/dL — ABNORMAL HIGH (ref 70–99)
Potassium: 3.5 mmol/L (ref 3.5–5.1)
Sodium: 135 mmol/L (ref 135–145)
Total Bilirubin: 0.2 mg/dL — ABNORMAL LOW (ref 0.3–1.2)
Total Protein: 6.8 g/dL (ref 6.5–8.1)

## 2021-12-28 LAB — CBC WITH DIFFERENTIAL/PLATELET
Abs Immature Granulocytes: 0.03 10*3/uL (ref 0.00–0.07)
Basophils Absolute: 0.1 10*3/uL (ref 0.0–0.1)
Basophils Relative: 1 %
Eosinophils Absolute: 0.3 10*3/uL (ref 0.0–0.5)
Eosinophils Relative: 5 %
HCT: 35.6 % — ABNORMAL LOW (ref 36.0–46.0)
Hemoglobin: 11.9 g/dL — ABNORMAL LOW (ref 12.0–15.0)
Immature Granulocytes: 1 %
Lymphocytes Relative: 22 %
Lymphs Abs: 1.5 10*3/uL (ref 0.7–4.0)
MCH: 30.3 pg (ref 26.0–34.0)
MCHC: 33.4 g/dL (ref 30.0–36.0)
MCV: 90.6 fL (ref 80.0–100.0)
Monocytes Absolute: 0.6 10*3/uL (ref 0.1–1.0)
Monocytes Relative: 9 %
Neutro Abs: 4.1 10*3/uL (ref 1.7–7.7)
Neutrophils Relative %: 62 %
Platelets: 195 10*3/uL (ref 150–400)
RBC: 3.93 MIL/uL (ref 3.87–5.11)
RDW: 14.8 % (ref 11.5–15.5)
WBC: 6.5 10*3/uL (ref 4.0–10.5)
nRBC: 0 % (ref 0.0–0.2)

## 2021-12-28 NOTE — Progress Notes (Signed)
Hematology/Oncology Consult note [facility]  [phone] [phone]  Patient Care Team: Reubin Milan, MD as PCP - General (Internal Medicine) Antonieta Iba, MD as Consulting Physician (Cardiology) Ronnette Juniper as Physician Assistant (Orthopedic Surgery) Lemar Livings Merrily Pew, MD (General Surgery) Kennedy Bucker, MD as Consulting Physician (Orthopedic Surgery) Rosey Bath, MD (Inactive) as Consulting Physician (Hematology and Oncology) Creig Hines, MD as Consulting Physician (Hematology and Oncology)   Name of the patient: Kelli Williams  [id number]  07-08-1926   Date of visit: 12/28/21  Diagnosis- metastatic ER positive breast cancer  Chief complaint/ Reason for visit-discuss CT scan results and further management  Heme/Onc history: Patient is a 88 year old female who was previously seeing Dr. Merlene Pulling and is now transferring her care to me.  She was diagnosed with left breast cancer in August 2000 and NTU.  It was a 0.8 cm grade 2 invasive mammary carcinoma ER 50% positive PR 90% positive.  She underwent adjuvant radiation treatment and completed 5 years of tamoxifen in July 2005.  She was noted to have metastatic breast cancer on left axillary biopsy in August 2018.  Pathology showed 1.1 cm grade 2 invasive mammary carcinoma with focal lobular features.  1 out of 4 axillary lymph nodes with extracapsular extension.  Tumor greater than 90% ER/PR positive and HER2 negative by FISH.  PET scan also showed widespread hypermetabolic osseous bone lesions she has been on Faslodex since August 2018 with stable disease and most recent PET scan in December 2021 showed no evidence of recurrence or progression.  Patient was also on Xgeva previously which was given between October 2018 but stopped in May 2019 due to dental issues   Patient was switched from Faslodex to letrozole in July 2022    Interval history- Overall doing  well for her age.  No recent hospitalizations.  Has mild chronic back pain  ECOG PS- 2 Pain scale- 3  Review of systems- Review of Systems  Constitutional:  Positive for malaise/fatigue. Negative for chills, fever and weight loss.  HENT:  Negative for congestion, ear discharge and nosebleeds.   Eyes:  Negative for blurred vision.  Respiratory:  Negative for cough, hemoptysis, sputum production, shortness of breath and wheezing.   Cardiovascular:  Negative for chest pain, palpitations, orthopnea and claudication.  Gastrointestinal:  Negative for abdominal pain, blood in stool, constipation, diarrhea, heartburn, melena, nausea and vomiting.  Genitourinary:  Negative for dysuria, flank pain, frequency, hematuria and urgency.  Musculoskeletal:  Positive for back pain. Negative for joint pain and myalgias.  Skin:  Negative for rash.  Neurological:  Negative for dizziness, tingling, focal weakness, seizures, weakness and headaches.  Endo/Heme/Allergies:  Does not bruise/bleed easily.  Psychiatric/Behavioral:  Negative for depression and suicidal ideas. The patient does not have insomnia.      Allergies  Allergen Reactions   Amitriptyline Hives   Amoxicillin     Diarrhea     Black Pepper [Piper]    Ivp Dye [Iodinated Contrast Media]    Tape Other (See Comments)    Whelps - please use paper tape.   Sulfa Antibiotics Rash     Past Medical History:  Diagnosis Date   Anxiety    Arthritis    BOTH FEET AND KNEES   Breast cancer (HCC) 2000   left breast ca with lumpectomy and rad tx 5 yr tamoxifen/Dr Constance Goltz at [facility]   Complication of anesthesia    WOKE UP DURING SURGERY FOR  DEVIATED SEPTUM, KNEE REPLACEMENT AND BREAST LUMPECTOMY   Dysrhythmia    GERD (gastroesophageal reflux disease)    Hypertension    Neuropathic pain of both legs    Neuropathy    Personal history of chemotherapy    Personal history of radiation therapy    Pneumonia 2016   Skin cancer of forehead    Skin cancer  of nose      Past Surgical History:  Procedure Laterality Date   ABDOMINAL HYSTERECTOMY     AXILLARY LYMPH NODE BIOPSY Left 07/10/2017   INVASIVE MAMMARY CARCINOMA WITH FOCAL LOBULAR FEATURES.    BREAST BIOPSY Bilateral 1960   benign   BREAST LUMPECTOMY Left 2000   breast ca with rad tx   BREAST LUMPECTOMY Left 08/20/2017   BREAST LUMPECTOMY Left 08/20/2017   Procedure: left breast wide excision;  Surgeon: Robert Bellow, MD;  Location: [facility] ORS;  Service: General;  Laterality: Left;   CATARACT EXTRACTION     left eye   CHOLECYSTECTOMY     JOINT REPLACEMENT     NASAL SEPTUM SURGERY     PARTIAL KNEE ARTHROPLASTY     right   TOTAL VAGINAL HYSTERECTOMY      Social History   Socioeconomic History   Marital status: Widowed    Spouse name: Not on file   Number of children: 3   Years of education: some college   Highest education level: 12th grade  Occupational History   Occupation: Retired  Tobacco Use   Smoking status: Never   Smokeless tobacco: Never   Tobacco comments:    smoking cessation materials not required  Vaping Use   Vaping Use: Never used  Substance and Sexual Activity   Alcohol use: No    Alcohol/week: 0.0 standard drinks   Drug use: No   Sexual activity: Not Currently  Other Topics Concern   Not on file  Social History Narrative   Patient lives in independent living at [place] Resource Strain: Low Risk    Difficulty of Paying Living Expenses: Not hard at all  Food Insecurity: No Food Insecurity   Worried About Charity fundraiser in the Last Year: Never true   Arboriculturist in the Last Year: Never true  Transportation Needs: No Transportation Needs   Lack of Transportation (Medical): No   Lack of Transportation (Non-Medical): No  Physical Activity: Inactive   Days of Exercise per Week: 0 days   Minutes of Exercise per Session: 0 min  Stress: No Stress Concern Present   Feeling of Stress  : Not at all  Social Connections: Socially Isolated   Frequency of Communication with Friends and Family: More than three times a week   Frequency of Social Gatherings with Friends and Family: Three times a week   Attends Religious Services: Never   Active Member of Clubs or Organizations: No   Attends Archivist Meetings: Never   Marital Status: Widowed  Human resources officer Violence: Not At Risk   Fear of Current or Ex-Partner: No   Emotionally Abused: No   Physically Abused: No   Sexually Abused: No    Family History  Problem Relation Age of Onset   Heart disease Mother    Heart attack Paternal Uncle    Breast cancer Daughter 66       genetic negative   Cancer Paternal Grandmother      Current Outpatient Medications:  acetaminophen (TYLENOL) 325 MG tablet, Take 650 mg by mouth every 6 (six) hours as needed., Disp: , Rfl:    aspirin 81 MG tablet, Take 1 tablet by mouth daily., Disp: , Rfl:    Calcium-Magnesium 500-250 MG TABS, Take 1 tablet by mouth daily. , Disp: , Rfl:    Cholecalciferol (VITAMIN D3) 25 MCG (1000 UT) CAPS, Take 1 capsule by mouth daily. , Disp: , Rfl:    Coenzyme Q10 (CO Q-10) 100 MG CAPS, Take 1 tablet by mouth daily. , Disp: , Rfl:    ezetimibe (ZETIA) 10 MG tablet, Take 1 tablet (10 mg total) by mouth daily., Disp: 90 tablet, Rfl: 0   gabapentin (NEURONTIN) 300 MG capsule, TAKE 3 CAPSULES BY MOUTH  TWICE DAILY, Disp: 540 capsule, Rfl: 1   letrozole (FEMARA) 2.5 MG tablet, TAKE 1 TABLET BY MOUTH  DAILY, Disp: 90 tablet, Rfl: 3   Liniments (ACE PAIN RELIEVING PATCH EX), Apply topically. Ameo, Disp: , Rfl:    lisinopril-hydrochlorothiazide (ZESTORETIC) 20-25 MG tablet, TAKE 1 AND 1/2 TABLETS BY  MOUTH DAILY, Disp: 135 tablet, Rfl: 3   meloxicam (MOBIC) 15 MG tablet, TAKE 1 TABLET BY MOUTH DAILY, Disp: 90 tablet, Rfl: 0   Multiple Vitamins-Minerals (MULTIVITAMIN WITH MINERALS) tablet, Take 1 tablet by mouth daily., Disp: , Rfl:    omeprazole  (PRILOSEC) 20 MG capsule, TAKE 1 CAPSULE BY MOUTH DAILY, Disp: 90 capsule, Rfl: 0   traMADol (ULTRAM) 50 MG tablet, TAKE 1/2 TO 1 TABLET BY  MOUTH EVERY 8 HOURS AS  NEEDED FOR PAIN (Patient taking differently: TAKE 1/2 TO 1 TABLET BY  MOUTH EVERY 8 HOURS AS  NEEDED FOR PAIN), Disp: 90 tablet, Rfl: 0   guaiFENesin (MUCINEX) 600 MG 12 hr tablet, Take 1 tablet (600 mg total) by mouth 2 (two) times daily. (Patient not taking: Reported on 12/28/2021), Disp: 30 tablet, Rfl: 0  Physical exam:  Vitals:   12/28/21 1330  BP: (!) 162/80  Pulse: 63  Resp: 17  Temp: 98 F (36.7 C)  TempSrc: Oral  SpO2: 98%  Weight: 185 lb (83.9 kg)   Physical Exam Cardiovascular:     Rate and Rhythm: Normal rate and regular rhythm.     Heart sounds: Normal heart sounds.  Pulmonary:     Effort: Pulmonary effort is normal.     Breath sounds: Normal breath sounds.  Abdominal:     General: Bowel sounds are normal.     Palpations: Abdomen is soft.  Skin:    General: Skin is warm and dry.  Neurological:     Mental Status: She is alert and oriented to person, place, and time.     CMP Latest Ref Rng & Units 12/28/2021  Glucose 70 - 99 mg/dL 107(H)  BUN 8 - 23 mg/dL 28(H)  Creatinine 0.44 - 1.00 mg/dL 0.97  Sodium 135 - 145 mmol/L 135  Potassium 3.5 - 5.1 mmol/L 3.5  Chloride 98 - 111 mmol/L 97(L)  CO2 22 - 32 mmol/L 29  Calcium 8.9 - 10.3 mg/dL 8.9  Total Protein 6.5 - 8.1 g/dL 6.8  Total Bilirubin 0.3 - 1.2 mg/dL 0.2(L)  Alkaline Phos 38 - 126 U/L 79  AST 15 - 41 U/L 18  ALT 0 - 44 U/L 11   CBC Latest Ref Rng & Units 12/28/2021  WBC 4.0 - 10.5 K/uL 6.5  Hemoglobin 12.0 - 15.0 g/dL 11.9(L)  Hematocrit 36.0 - 46.0 % 35.6(L)  Platelets 150 - 400 K/uL 195  No images are attached to the encounter.  NM Bone Scan Whole Body  Result Date: 12/18/2021 CLINICAL DATA:  Breast cancer restaging workup. EXAM: NUCLEAR MEDICINE WHOLE BODY BONE SCAN TECHNIQUE: Whole body anterior and posterior images were obtained  approximately 3 hours after intravenous injection of radiopharmaceutical. RADIOPHARMACEUTICALS:  25.0 mCi Technetium-41m MDP IV COMPARISON:  Whole-body bone scan 11/19/2027, and PET tumor imaging 11/21/2020 FINDINGS: There are 2 small foci of interval increased activity, previously present but less pronounced, with 1 in each superior frontal bone not far off the midline. These could represent benign dural calcifications, calcified meningiomas or bone metastases. They are similar in size to prior study more noticeable today. There are chronic degenerative changes in the cervical, thoracic and lumbar spine, both knees, left ankle and bilateral mid feet. Additional degenerative change in both shoulders. No other concerning sites of activity are visualized in the axial and included appendicular skeleton. Soft tissue and renal activity unremarkable. IMPRESSION: Small sites of increased tracer activity with 1 in the area of each superior frontal bone not far from the midline. These could represent benign dural calcifications, calcified meningiomas or calvarial metastases. MRI or CT recommended. This was seen on the 2020 study but is more noticeable today. Degenerative changes described above with no further evidence for osteoblastic metastases. Electronically Signed   By: Telford Nab M.D.   On: 12/18/2021 22:30     Assessment and plan- Patient is a 88 y.o. female with ER positive breast cancer and bone metastases previously on Faslodex since 2018.  Patient switched to letrozole in July 2022 and this is a routine follow-up visit  I have reviewed CT chest abdomen and pelvis images independently as well as bone scan and discussed findings with the patient.  CT scan does not show any evidence of ongoing visceral metastatic disease.  She has stable lytic lesions in the thoracic and lumbar spine which have remained unchanged.  Bone scan predominantly showed shows degenerative changes in the spine and has not picked up  the lytic lesions/osteblastic lesions well.  Tumor markers are stable.  Plan is to continueLetrozole at this time.  She also had a bone density scan in October which showed osteopenia which is mildly worse as compared to her prior bone density scans.  We will continue to monitor.  If her score is -2.5 or worse we will have to consider switching her from letrozole to tamoxifen at that time.  Presently patient seems to be tolerating letrozole well without any significant side effects  I will see her back in 4 months with CBC with differential CMP and CA 27-29   Visit Diagnosis 1. Use of letrozole (Femara)   2. Metastatic breast cancer [facility])      Dr. Randa Evens, MD, MPH [facility] [phone] 12/28/2021 1:34 PM

## 2021-12-28 NOTE — Progress Notes (Signed)
Patient here for oncology follow-up appointment, concerns of neuropathy and back pain

## 2021-12-29 LAB — CANCER ANTIGEN 27.29: CA 27.29: 17.1 U/mL (ref 0.0–38.6)

## 2021-12-30 ENCOUNTER — Encounter: Payer: Self-pay | Admitting: Hematology and Oncology

## 2022-01-06 NOTE — Progress Notes (Signed)
If Dr. Ardiology Office Note  Date:  01/07/2022   ID:  Kelli Williams, DOB 04/26/1926, MRN [id number]  PCP:  Kelli Hess, MD   Chief Complaint  Patient presents with   12 month follow up     "Doing well." Medications reviewed by the patient verbally.     HPI:  [name] is a 88 year old woman  Mild aortic atherosclerosis Status post radiation treatment for breast cancer in 2000 on the left with 7 weeks of radiation on a daily basis.  She lost her husband in March 2016.   last echo 2011: normal EF 60%, who presents for routine followup of her tachycardia/palpitations, breast cancer  LOV 10/2020 better to  Followed by oncology for cancer  CT scan, concerned about finding, No evidence of progressive metastatic disease. 2. Unchanged lytic lesions of the thoracic and lumbar spine, compatible with osseous metastatic disease. 3.  Aortic Atherosclerosis (ICD10-I70.0).  Images reviewed: mild to moderate aortic atherosclerosis Images shown to her  [place] the [place] Lives in an apartment No regular exercise program, walks with a walker  Chronic leg swelling,chronic stable Unable to place TE hose No chest pain, no SOB  EKG personally reviewed by myself on todays visit Shows normal sinus rhythm rate 66 bpm poor R wave progression, LAd  Other past medical hx reviewed  lymph node under left arm resected, tested positive for breast cancer 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 Moderate cardiomegaly.  at least mild to moderate diffuse aortic atherosclerosis extending up to the carotids, some degree of coronary calcification/difficult to determine secondary to motion artifact    PMH:   has a past medical history of  Anxiety, Arthritis, Breast cancer ([place]) (7829), Complication of anesthesia, Dysrhythmia, GERD (gastroesophageal reflux disease), Hypertension, Neuropathic pain of both legs, Neuropathy, Personal history of chemotherapy, Personal history of radiation therapy, Pneumonia (2016), Skin cancer of forehead, and Skin cancer of nose.  PSH:    Past Surgical History:  Procedure Laterality Date   ABDOMINAL HYSTERECTOMY     AXILLARY LYMPH NODE BIOPSY Left 07/10/2017   INVASIVE MAMMARY CARCINOMA WITH FOCAL LOBULAR FEATURES.    BREAST BIOPSY Bilateral 1960   benign   BREAST LUMPECTOMY Left 2000   breast ca with rad tx   BREAST LUMPECTOMY Left 08/20/2017   BREAST LUMPECTOMY Left 08/20/2017   Procedure: left breast wide excision;  Surgeon: Robert Bellow, MD;  Location: [facility] ORS;  Service: General;  Laterality: Left;   CATARACT EXTRACTION     left eye   CHOLECYSTECTOMY     JOINT REPLACEMENT     NASAL SEPTUM SURGERY     PARTIAL KNEE ARTHROPLASTY     right   TOTAL VAGINAL HYSTERECTOMY      Current Outpatient Medications  Medication Sig Dispense Refill   acetaminophen (TYLENOL) 325 MG tablet Take 650 mg by mouth every 6 (six) hours as needed.     aspirin 81 MG tablet Take 1 tablet by mouth daily.     Calcium-Magnesium 500-250 MG TABS Take 1 tablet by mouth daily.      Cholecalciferol (VITAMIN D3) 25 MCG (1000 UT) CAPS Take 1 capsule by mouth daily.      Coenzyme Q10 (CO Q-10) 100 MG CAPS Take 1 tablet by mouth daily.  ezetimibe (ZETIA) 10 MG tablet Take 1 tablet (10 mg total) by mouth daily. 90 tablet 0   gabapentin (NEURONTIN) 300 MG capsule TAKE 3 CAPSULES BY MOUTH  TWICE DAILY 540 capsule 1   letrozole (FEMARA) 2.5 MG tablet TAKE 1 TABLET BY MOUTH  DAILY 90 tablet 3   Liniments (ACE PAIN RELIEVING PATCH EX) Apply topically. Ameo     lisinopril-hydrochlorothiazide (ZESTORETIC) 20-25 MG tablet TAKE 1 AND 1/2 TABLETS BY  MOUTH DAILY 135 tablet 3   meloxicam (MOBIC) 15 MG tablet TAKE 1 TABLET  BY MOUTH DAILY 90 tablet 0   Multiple Vitamins-Minerals (MULTIVITAMIN WITH MINERALS) tablet Take 1 tablet by mouth daily.     omeprazole (PRILOSEC) 20 MG capsule TAKE 1 CAPSULE BY MOUTH DAILY 90 capsule 0   traMADol (ULTRAM) 50 MG tablet TAKE 1/2 TO 1 TABLET BY  MOUTH EVERY 8 HOURS AS  NEEDED FOR PAIN (Patient taking differently: TAKE 1/2 TO 1 TABLET BY  MOUTH EVERY 8 HOURS AS  NEEDED FOR PAIN) 90 tablet 0   guaiFENesin (MUCINEX) 600 MG 12 hr tablet Take 1 tablet (600 mg total) by mouth 2 (two) times daily. (Patient not taking: Reported on 12/28/2021) 30 tablet 0   No current facility-administered medications for this visit.     Allergies:   Amitriptyline, Amoxicillin, Black pepper [piper], Ivp dye [iodinated contrast media], Tape, and Sulfa antibiotics   Social History:  The patient  reports that she has never smoked. She has never used smokeless tobacco. She reports that she does not drink alcohol and does not use drugs.   Family History:   family history includes Breast cancer (age of onset: 64) in her daughter; Cancer in her paternal grandmother; Heart attack in her paternal uncle; Heart disease in her mother.    Review of Systems: Review of Systems  HENT: Negative.    Respiratory: Negative.    Cardiovascular:  Positive for leg swelling.  Gastrointestinal: Negative.   Musculoskeletal:  Positive for joint pain.       Difficulty walking  Neurological: Negative.   Psychiatric/Behavioral: Negative.    All other systems reviewed and are negative.  PHYSICAL EXAM: VS:  BP 140/90 (BP Location: Right Arm, Patient Position: Sitting, Cuff Size: Normal)    Pulse 66    Ht 5\' 3"  (1.6 m)    Wt 187 lb 2 oz (84.9 kg)    SpO2 97%    BMI 33.15 kg/m  , BMI Body mass index is 33.15 kg/m. Constitutional:  oriented to person, place, and time. No distress.  HENT:  Head: Grossly normal Eyes:  no discharge. No scleral icterus.  Neck: No JVD, no carotid bruits  Cardiovascular: Regular rate and rhythm,  no murmurs appreciated 1+ edema, minimally pitting Pulmonary/Chest: Clear to auscultation bilaterally, no wheezes or rales Abdominal: Soft.  no distension.  no tenderness.  Musculoskeletal: Normal range of motion Neurological:  normal muscle tone. Coordination normal. No atrophy Skin: Skin warm and dry Psychiatric: normal affect, pleasant   Recent Labs: 02/19/2021: TSH 1.698 12/28/2021: ALT 11; BUN 28; Creatinine, Ser 0.97; Hemoglobin 11.9; Platelets 195; Potassium 3.5; Sodium 135    Lipid Panel Lab Results  Component Value Date   CHOL 164 10/23/2020   HDL 60 10/23/2020   LDLCALC 80 10/23/2020   TRIG 142 10/23/2020    Wt Readings from Last 3 Encounters:  01/07/22 187 lb 2 oz (84.9 kg)  12/28/21 185 lb (83.9 kg)  08/28/21 188 lb (85.3 kg)  ASSESSMENT AND PLAN:  Essential hypertension - Plan: EKG 12-Lead Blood pressure is well controlled on today's visit. No changes made to the medications.  Atrial tachycardia ([place]) - Plan: EKG 12-Lead No sx, off b-blocker NSR on todays visit  Adjustment disorder with other symptom  loss of her husband [place] the village  Leg swelling -  Lymphedema, unable to place TED hose suggest leg elevation Continue to be significant Not particularly interested in lymphedema compression pumps  Breast cancer Previously treated with chemotherapy medication has completed XRT Hx of metastases to bone Recent CT reviewed  Aortic atherosclerosis/hyperlipidemia Mild-to-moderate in nature Continue  Zetia 10 mg daily  Coronary calcifiction Seen on CT scan Also mild ortic athero Continue zetia    Total encounter time more than 25 minutes  Greater than 50% was spent in counseling and coordination of care with the patient    Orders Placed This Encounter  Procedures   EKG 12-Lead     Signed, Esmond Plants, M.D., Ph.D. 01/07/2022  [place], [place]

## 2022-01-07 ENCOUNTER — Ambulatory Visit (INDEPENDENT_AMBULATORY_CARE_PROVIDER_SITE_OTHER): Payer: Medicare Other | Admitting: Cardiovascular Disease

## 2022-01-07 ENCOUNTER — Other Ambulatory Visit: Payer: Self-pay

## 2022-01-07 ENCOUNTER — Encounter: Payer: Self-pay | Admitting: Cardiovascular Disease

## 2022-01-07 VITALS — BP 140/90 | HR 66 | Ht 63.0 in | Wt 187.1 lb

## 2022-01-07 DIAGNOSIS — I739 Peripheral vascular disease, unspecified: Secondary | ICD-10-CM

## 2022-01-07 DIAGNOSIS — I2510 Atherosclerotic heart disease of native coronary artery without angina pectoris: Secondary | ICD-10-CM | POA: Diagnosis not present

## 2022-01-07 DIAGNOSIS — I10 Essential (primary) hypertension: Secondary | ICD-10-CM

## 2022-01-07 DIAGNOSIS — I700 Atherosclerosis of aorta: Secondary | ICD-10-CM | POA: Diagnosis not present

## 2022-01-07 MED ORDER — POTASSIUM CHLORIDE ER 10 MEQ PO TBCR: 10.0000 meq | EXTENDED_RELEASE_TABLET | Freq: Every day | ORAL | 3 refills | Status: DC

## 2022-01-07 NOTE — Patient Instructions (Addendum)
Medication Instructions:  Please start  potassium 10 meq once a day  If you need a refill on your cardiac medications before your next appointment, please call your pharmacy.   Lab work: No new labs needed  Testing/Procedures: No new testing needed  Follow-Up: At [facility], you and your health needs are our priority.  As part of our continuing mission to provide you with exceptional heart care, we have created designated Provider Care Teams.  These Care Teams include your primary Cardiologist (physician) and Advanced Practice Providers (APPs -  Physician Assistants and Nurse Practitioners) who all work together to provide you with the care you need, when you need it.  You will need a follow up appointment in 12 months  Providers on your designated Care Team:   Murray Hodgkins, NP Christell Faith, PA-C Cadence Kathlen Mody, [place]  COVID-19 Vaccine Information can be found at: [email or web address] For questions related to vaccine distribution or appointments, please email [email or web address] [email or web address] or call [phone].

## 2022-01-30 ENCOUNTER — Telehealth: Payer: Self-pay | Admitting: Internal Medicine

## 2022-01-30 NOTE — Telephone Encounter (Signed)
Called pt and et her know that the doors open back up at 12:45 after lunch. Pt states she should be good then

## 2022-01-30 NOTE — Telephone Encounter (Signed)
Copied from [place] [phone]. Topic: General - Other >> Jan 30, 2022 12:06 PM Alanda Slim E wrote: Reason for CRM: Pt lives at the [facility] on [address] and their transportation will pick her up for her appt at 12:30pm and drop her off / due to this schedule she will be very early for her appt and may arrive to office before 1pm / she asked if Dr. Neomia Dear can see her earlier or if someone will be there to let her in/ please call pt to advise/ her appt is 2.28.23

## 2022-02-05 ENCOUNTER — Ambulatory Visit (INDEPENDENT_AMBULATORY_CARE_PROVIDER_SITE_OTHER): Payer: Medicare Other | Admitting: Internal Medicine

## 2022-02-05 ENCOUNTER — Encounter: Payer: Self-pay | Admitting: Internal Medicine

## 2022-02-05 ENCOUNTER — Other Ambulatory Visit: Payer: Self-pay

## 2022-02-05 VITALS — BP 162/71 | HR 67 | Temp 98.1°F | Ht 62.99 in | Wt 182.2 lb

## 2022-02-05 DIAGNOSIS — I739 Peripheral vascular disease, unspecified: Secondary | ICD-10-CM | POA: Diagnosis not present

## 2022-02-05 DIAGNOSIS — I10 Essential (primary) hypertension: Secondary | ICD-10-CM

## 2022-02-05 DIAGNOSIS — Z7689 Persons encountering health services in other specified circumstances: Secondary | ICD-10-CM

## 2022-02-05 DIAGNOSIS — C50412 Malignant neoplasm of upper-outer quadrant of left female breast: Secondary | ICD-10-CM | POA: Diagnosis not present

## 2022-02-05 DIAGNOSIS — Z170 Estrogen receptor positive status [ER+]: Secondary | ICD-10-CM

## 2022-02-05 MED ORDER — FEXOFENADINE HCL 180 MG PO TABS: 180.0000 mg | ORAL_TABLET | Freq: Every day | ORAL | 1 refills | Status: DC

## 2022-02-05 MED ORDER — LISINOPRIL 40 MG PO TABS: 40.0000 mg | ORAL_TABLET | Freq: Every day | ORAL | 3 refills | Status: DC

## 2022-02-05 MED ORDER — METOPROLOL SUCCINATE ER 25 MG PO TB24: 25.0000 mg | ORAL_TABLET | Freq: Every day | ORAL | 3 refills | Status: DC

## 2022-02-05 NOTE — Progress Notes (Signed)
BP (!) 162/71    Pulse 67    Temp 98.1 F (36.7 C) (Oral)    Ht 5' 2.99" (1.6 m)    Wt 182 lb 3.2 oz (82.6 kg)    SpO2 96%    BMI 32.28 kg/m    Subjective:    Patient ID: Kelli Williams, female    DOB: [birth year], 88 y.o.   MRN: [id number]  Chief Complaint  Patient presents with   New Patient (Initial Visit)    HPI: Kelli Williams is a 88 y.o. female  Pt is here to establish care, changed from pcp in [place] sec to a fall out with her last pcp. Pt has a ho breast cancer diagnosed in 2000 had a lumpetcomy has mets   HTN, CAD< PAD She underwent adjuvant radiation treatment and completed 5 years of tamoxifen in July 2005. 2018 - jad a mets in left axilla. Has bony mets to lower ack spine and hips hurt now. Pt ishas a ho atrail tachycardia sees d.r gollan for such  Also has Aortic atherosclerosis is on zetia for such   Pt lives in independent living @ [place] she takes care of cooking and cleaning has a helper once in a couple weeks to help with food.  Hypertension This is a chronic problem. The current episode started 1 to 4 weeks ago. Pertinent negatives include no chest pain, headaches, palpitations or shortness of breath.         Relevant past medical, surgical, family and social history reviewed and updated as indicated. Interim medical history since our last visit reviewed. Allergies and medications reviewed and updated.  Review of Systems  Constitutional:  Negative for activity change, appetite change, chills, fatigue and fever.  HENT:  Negative for congestion, ear discharge, ear pain and facial swelling.   Eyes:  Negative for pain and itching.  Respiratory:  Negative for cough, chest tightness, shortness of breath and wheezing.   Cardiovascular:  Negative for chest pain, palpitations and leg swelling.  Gastrointestinal:  Negative for abdominal distention, abdominal pain, blood in stool, constipation, diarrhea, nausea and vomiting.  Endocrine: Negative for cold  intolerance, heat intolerance, polydipsia, polyphagia and polyuria.  Genitourinary:  Negative for difficulty urinating, dysuria, flank pain, frequency, hematuria and urgency.  Musculoskeletal:  Negative for arthralgias, gait problem, joint swelling and myalgias.  Skin:  Negative for color change, rash and wound.  Neurological:  Negative for dizziness, tremors, speech difficulty, weakness, light-headedness, numbness and headaches.  Hematological:  Does not bruise/bleed easily.  Psychiatric/Behavioral:  Negative for agitation, confusion, decreased concentration, sleep disturbance and suicidal ideas.    Per HPI unless specifically indicated above     Objective:    BP (!) 162/71    Pulse 67    Temp 98.1 F (36.7 C) (Oral)    Ht 5' 2.99" (1.6 m)    Wt 182 lb 3.2 oz (82.6 kg)    SpO2 96%    BMI 32.28 kg/m   Wt Readings from Last 3 Encounters:  02/05/22 182 lb 3.2 oz (82.6 kg)  01/07/22 187 lb 2 oz (84.9 kg)  12/28/21 185 lb (83.9 kg)    Physical Exam Vitals and nursing note reviewed.  Constitutional:      General: She is not in acute distress.    Appearance: Normal appearance. She is not ill-appearing or diaphoretic.  Eyes:     Conjunctiva/sclera: Conjunctivae normal.  Pulmonary:     Breath sounds: No rhonchi.  Abdominal:  General: Abdomen is flat. Bowel sounds are normal. There is no distension.     Palpations: Abdomen is soft. There is no mass.     Tenderness: There is no abdominal tenderness. There is no guarding.  Skin:    General: Skin is warm and dry.     Coloration: Skin is not jaundiced.     Findings: No erythema.  Neurological:     Mental Status: She is alert.    Results for orders placed or performed in visit on 12/28/21  Cancer antigen 27.29  Result Value Ref Range   CA 27.29 17.1 0.0 - 38.6 U/mL  Comprehensive metabolic panel  Result Value Ref Range   Sodium 135 135 - 145 mmol/L   Potassium 3.5 3.5 - 5.1 mmol/L   Chloride 97 (L) 98 - 111 mmol/L   CO2 29 22  - 32 mmol/L   Glucose, Bld 107 (H) 70 - 99 mg/dL   BUN 28 (H) 8 - 23 mg/dL   Creatinine, Ser 0.97 0.44 - 1.00 mg/dL   Calcium 8.9 8.9 - 10.3 mg/dL   Total Protein 6.8 6.5 - 8.1 g/dL   Albumin 3.9 3.5 - 5.0 g/dL   AST 18 15 - 41 U/L   ALT 11 0 - 44 U/L   Alkaline Phosphatase 79 38 - 126 U/L   Total Bilirubin 0.2 (L) 0.3 - 1.2 mg/dL   GFR, Estimated 54 (L) >60 mL/min   Anion gap 9 5 - 15  CBC with Differential  Result Value Ref Range   WBC 6.5 4.0 - 10.5 K/uL   RBC 3.93 3.87 - 5.11 MIL/uL   Hemoglobin 11.9 (L) 12.0 - 15.0 g/dL   HCT 35.6 (L) 36.0 - 46.0 %   MCV 90.6 80.0 - 100.0 fL   MCH 30.3 26.0 - 34.0 pg   MCHC 33.4 30.0 - 36.0 g/dL   RDW 14.8 11.5 - 15.5 %   Platelets 195 150 - 400 K/uL   nRBC 0.0 0.0 - 0.2 %   Neutrophils Relative % 62 %   Neutro Abs 4.1 1.7 - 7.7 K/uL   Lymphocytes Relative 22 %   Lymphs Abs 1.5 0.7 - 4.0 K/uL   Monocytes Relative 9 %   Monocytes Absolute 0.6 0.1 - 1.0 K/uL   Eosinophils Relative 5 %   Eosinophils Absolute 0.3 0.0 - 0.5 K/uL   Basophils Relative 1 %   Basophils Absolute 0.1 0.0 - 0.1 K/uL   Immature Granulocytes 1 %   Abs Immature Granulocytes 0.03 0.00 - 0.07 K/uL        Current Outpatient Medications:    acetaminophen (TYLENOL) 325 MG tablet, Take 650 mg by mouth every 6 (six) hours as needed., Disp: , Rfl:    aspirin 81 MG tablet, Take 1 tablet by mouth daily., Disp: , Rfl:    Calcium-Magnesium 500-250 MG TABS, Take 1 tablet by mouth daily. , Disp: , Rfl:    Cholecalciferol (VITAMIN D3) 25 MCG (1000 UT) CAPS, Take 1 capsule by mouth daily. , Disp: , Rfl:    Coenzyme Q10 (CO Q-10) 100 MG CAPS, Take 1 tablet by mouth daily. , Disp: , Rfl:    ezetimibe (ZETIA) 10 MG tablet, Take 1 tablet (10 mg total) by mouth daily., Disp: 90 tablet, Rfl: 0   letrozole (FEMARA) 2.5 MG tablet, TAKE 1 TABLET BY MOUTH  DAILY, Disp: 90 tablet, Rfl: 3   Liniments (ACE PAIN RELIEVING PATCH EX), Apply topically. Ameo, Disp: , Rfl:  lisinopril-hydrochlorothiazide (ZESTORETIC) 20-25 MG tablet, TAKE 1 AND 1/2 TABLETS BY  MOUTH DAILY, Disp: 135 tablet, Rfl: 3   meloxicam (MOBIC) 15 MG tablet, TAKE 1 TABLET BY MOUTH DAILY, Disp: 90 tablet, Rfl: 0   Multiple Vitamins-Minerals (MULTIVITAMIN WITH MINERALS) tablet, Take 1 tablet by mouth daily., Disp: , Rfl:    omeprazole (PRILOSEC) 20 MG capsule, TAKE 1 CAPSULE BY MOUTH DAILY, Disp: 90 capsule, Rfl: 0   potassium chloride (KLOR-CON) 10 MEQ tablet, Take 1 tablet (10 mEq total) by mouth daily., Disp: 90 tablet, Rfl: 3   traMADol (ULTRAM) 50 MG tablet, TAKE 1/2 TO 1 TABLET BY  MOUTH EVERY 8 HOURS AS  NEEDED FOR PAIN (Patient taking differently: TAKE 1/2 TO 1 TABLET BY  MOUTH EVERY 8 HOURS AS  NEEDED FOR PAIN), Disp: 90 tablet, Rfl: 0   gabapentin (NEURONTIN) 300 MG capsule, TAKE 3 CAPSULES BY MOUTH  TWICE DAILY, Disp: 540 capsule, Rfl: 1   guaiFENesin (MUCINEX) 600 MG 12 hr tablet, Take 1 tablet (600 mg total) by mouth 2 (two) times daily. (Patient not taking: Reported on 12/28/2021), Disp: 30 tablet, Rfl: 0    Assessment & Plan:  1.Htn change to lisinopril 40 mg and add metoprolol xl 25 mg  HTN :  Continue current meds.  Medication compliance emphasised. pt advised to keep Bp logs. Pt verbalised understanding of the same. Pt to have a low salt diet . Exercise to reach a goal of at least 150 mins a week.  lifestyle modifications explained and pt understands importance of the above. Under good control on current regimen. Continue current regimen. Continue to monitor. Call with any concerns. Refills given. Labs drawn today.  2 breast cancer fu and mx with heme onc.   3. Osteopenia continue vit d3  4. Aortic atherosclerosis Patient is on zetia for such. Consider further referral to cardiology for work-up including stress testing.   5. PAD is on gabapentin for such    Problem List Items Addressed This Visit   None    No orders of the defined types were placed in this  encounter.    No orders of the defined types were placed in this encounter.    Follow up plan: No follow-ups on file.

## 2022-02-19 ENCOUNTER — Other Ambulatory Visit: Payer: Self-pay

## 2022-02-19 ENCOUNTER — Other Ambulatory Visit: Payer: Medicare Other

## 2022-02-19 DIAGNOSIS — Z7689 Persons encountering health services in other specified circumstances: Secondary | ICD-10-CM

## 2022-02-19 DIAGNOSIS — Z136 Encounter for screening for cardiovascular disorders: Secondary | ICD-10-CM | POA: Diagnosis not present

## 2022-02-19 DIAGNOSIS — I700 Atherosclerosis of aorta: Secondary | ICD-10-CM | POA: Diagnosis not present

## 2022-02-19 LAB — URINALYSIS, ROUTINE W REFLEX MICROSCOPIC
Bilirubin, UA: NEGATIVE
Glucose, UA: NEGATIVE
Ketones, UA: NEGATIVE
Nitrite, UA: NEGATIVE
RBC, UA: NEGATIVE
Specific Gravity, UA: 1.02 (ref 1.005–1.030)
Urobilinogen, Ur: 0.2 mg/dL (ref 0.2–1.0)
pH, UA: 5.5 (ref 5.0–7.5)

## 2022-02-19 LAB — MICROSCOPIC EXAMINATION
Epithelial Cells (non renal): NONE SEEN /hpf (ref 0–10)
RBC, Urine: NONE SEEN /hpf (ref 0–2)

## 2022-02-20 LAB — CBC WITH DIFFERENTIAL/PLATELET
Basophils Absolute: 0.1 10*3/uL (ref 0.0–0.2)
Basos: 1 %
EOS (ABSOLUTE): 0.5 10*3/uL — ABNORMAL HIGH (ref 0.0–0.4)
Eos: 7 %
Hematocrit: 37.9 % (ref 34.0–46.6)
Hemoglobin: 12.7 g/dL (ref 11.1–15.9)
Immature Grans (Abs): 0 10*3/uL (ref 0.0–0.1)
Immature Granulocytes: 0 %
Lymphocytes Absolute: 1.4 10*3/uL (ref 0.7–3.1)
Lymphs: 20 %
MCH: 30 pg (ref 26.6–33.0)
MCHC: 33.5 g/dL (ref 31.5–35.7)
MCV: 90 fL (ref 79–97)
Monocytes Absolute: 0.6 10*3/uL (ref 0.1–0.9)
Monocytes: 8 %
Neutrophils Absolute: 4.5 10*3/uL (ref 1.4–7.0)
Neutrophils: 64 %
Platelets: 230 10*3/uL (ref 150–450)
RBC: 4.23 x10E6/uL (ref 3.77–5.28)
RDW: 13.4 % (ref 11.7–15.4)
WBC: 7.1 10*3/uL (ref 3.4–10.8)

## 2022-02-20 LAB — COMPREHENSIVE METABOLIC PANEL
ALT: 9 IU/L (ref 0–32)
AST: 17 IU/L (ref 0–40)
Albumin/Globulin Ratio: 1.5 (ref 1.2–2.2)
Albumin: 4.1 g/dL (ref 3.5–4.6)
Alkaline Phosphatase: 99 IU/L (ref 44–121)
BUN/Creatinine Ratio: 25 (ref 12–28)
BUN: 25 mg/dL (ref 10–36)
Bilirubin Total: 0.4 mg/dL (ref 0.0–1.2)
CO2: 25 mmol/L (ref 20–29)
Calcium: 10 mg/dL (ref 8.7–10.3)
Chloride: 100 mmol/L (ref 96–106)
Creatinine, Ser: 1 mg/dL (ref 0.57–1.00)
Globulin, Total: 2.7 g/dL (ref 1.5–4.5)
Glucose: 91 mg/dL (ref 70–99)
Potassium: 4.3 mmol/L (ref 3.5–5.2)
Sodium: 136 mmol/L (ref 134–144)
Total Protein: 6.8 g/dL (ref 6.0–8.5)
eGFR: 52 mL/min/{1.73_m2} — ABNORMAL LOW (ref >59)

## 2022-02-20 LAB — LIPID PANEL
Chol/HDL Ratio: 2.8 ratio (ref 0.0–4.4)
Cholesterol, Total: 135 mg/dL (ref 100–199)
HDL: 49 mg/dL (ref >39)
LDL Chol Calc (NIH): 69 mg/dL (ref 0–99)
Triglycerides: 86 mg/dL (ref 0–149)
VLDL Cholesterol Cal: 17 mg/dL (ref 5–40)

## 2022-02-20 LAB — TSH: TSH: 3.32 u[IU]/mL (ref 0.450–4.500)

## 2022-02-25 ENCOUNTER — Other Ambulatory Visit: Payer: Self-pay | Admitting: Internal Medicine

## 2022-02-25 DIAGNOSIS — G5793 Unspecified mononeuropathy of bilateral lower limbs: Secondary | ICD-10-CM

## 2022-02-26 ENCOUNTER — Other Ambulatory Visit: Payer: Self-pay | Admitting: Internal Medicine

## 2022-02-26 ENCOUNTER — Other Ambulatory Visit: Payer: Self-pay

## 2022-02-26 ENCOUNTER — Encounter: Payer: Self-pay | Admitting: Internal Medicine

## 2022-02-26 ENCOUNTER — Ambulatory Visit: Payer: Self-pay

## 2022-02-26 ENCOUNTER — Telehealth: Payer: Self-pay | Admitting: Cardiovascular Disease

## 2022-02-26 ENCOUNTER — Ambulatory Visit (INDEPENDENT_AMBULATORY_CARE_PROVIDER_SITE_OTHER): Payer: Medicare Other | Admitting: Internal Medicine

## 2022-02-26 VITALS — BP 180/80 | HR 52 | Temp 98.3°F | Ht 62.99 in | Wt 181.2 lb

## 2022-02-26 DIAGNOSIS — Z66 Do not resuscitate: Secondary | ICD-10-CM

## 2022-02-26 DIAGNOSIS — K589 Irritable bowel syndrome without diarrhea: Secondary | ICD-10-CM | POA: Diagnosis not present

## 2022-02-26 DIAGNOSIS — R4189 Other symptoms and signs involving cognitive functions and awareness: Secondary | ICD-10-CM | POA: Diagnosis not present

## 2022-02-26 MED ORDER — LISINOPRIL-HYDROCHLOROTHIAZIDE 20-25 MG PO TABS: 1.0000 | ORAL_TABLET | Freq: Every day | ORAL | 3 refills | Status: DC

## 2022-02-26 MED ORDER — OMEPRAZOLE 20 MG PO CPDR: 20.0000 mg | DELAYED_RELEASE_CAPSULE | Freq: Every day | ORAL | 2 refills | Status: DC

## 2022-02-26 MED ORDER — EZETIMIBE 10 MG PO TABS: 10.0000 mg | ORAL_TABLET | Freq: Every day | ORAL | 0 refills | Status: DC

## 2022-02-26 NOTE — Telephone Encounter (Signed)
Pt's daughter calling in to give BP results. Pt had appt today and was advised to check BP when she got home to see if another dose of lisinopril-hctz was needed. Daughter gave BP readings of 1400 167/129, 1420 160/74, 1425 157/94. Daughter would like f/up call.  ? ?Answer Assessment - Initial Assessment Questions ?1. BLOOD PRESSURE: "What is the blood pressure?" "Did you take at least two measurements 5 minutes apart?" ?    167/129, 160/74, 157/94 ?2. ONSET: "When did you take your blood pressure?" ?    Today now ?3. HOW: "How did you obtain the blood pressure?" (e.g., visiting nurse, automatic home BP monitor) ?    Auto cuff on arm  ?4. HISTORY: "Do you have a history of high blood pressure?" ?    yes ?5. MEDICATIONS: "Are you taking any medications for blood pressure?" "Have you missed any doses recently?" ?    yes ?6. OTHER SYMPTOMS: "Do you have any symptoms?" (e.g., headache, chest pain, blurred vision, difficulty breathing, weakness) ?    No ? ?Protocols used: Blood Pressure - High-A-AH ? ?

## 2022-02-26 NOTE — Telephone Encounter (Signed)
Patient and daughter were made aware of results and verbalized understanding.  ? ?

## 2022-02-26 NOTE — Patient Instructions (Signed)
Latest Reference Range & Units 02/19/22 10:31  ?COMPREHENSIVE METABOLIC PANEL  Rpt !  ?Sodium 134 - 144 mmol/L 136  ?Potassium 3.5 - 5.2 mmol/L 4.3  ?Chloride 96 - 106 mmol/L 100  ?CO2 20 - 29 mmol/L 25  ?Glucose 70 - 99 mg/dL 91  ?BUN 10 - 36 mg/dL 25  ?Creatinine 0.57 - 1.00 mg/dL 1.00  ?Calcium 8.7 - 10.3 mg/dL 10.0  ?BUN/Creatinine Ratio 12 - 28  25  ?eGFR >59 mL/min/1.73 52 (L)  ?Alkaline Phosphatase 44 - 121 IU/L 99  ?Albumin 3.5 - 4.6 g/dL 4.1  ?Albumin/Globulin Ratio 1.2 - 2.2  1.5  ?AST 0 - 40 IU/L 17  ?ALT 0 - 32 IU/L 9  ?Total Protein 6.0 - 8.5 g/dL 6.8  ?Total Bilirubin 0.0 - 1.2 mg/dL 0.4  ?Total CHOL/HDL Ratio 0.0 - 4.4 ratio 2.8  ?Cholesterol, Total 100 - 199 mg/dL 135  ?HDL Cholesterol >39 mg/dL 49  ?Triglycerides 0 - 149 mg/dL 86  ?VLDL Cholesterol Cal 5 - 40 mg/dL 17  ?LDL Chol Calc (NIH) 0 - 99 mg/dL 69  ?Globulin, Total 1.5 - 4.5 g/dL 2.7  ?WBC 3.4 - 10.8 x10E3/uL 7.1  ?RBC 3.77 - 5.28 x10E6/uL 4.23  ?Hemoglobin 11.1 - 15.9 g/dL 12.7  ?HCT 34.0 - 46.6 % 37.9  ?MCV 79 - 97 fL 90  ?MCH 26.6 - 33.0 pg 30.0  ?MCHC 31.5 - 35.7 g/dL 33.5  ?RDW 11.7 - 15.4 % 13.4  ?Platelets 150 - 450 x10E3/uL 230  ?Neutrophils Not Estab. % 64  ?Immature Granulocytes Not Estab. % 0  ?NEUT# 1.4 - 7.0 x10E3/uL 4.5  ?Lymphocyte # 0.7 - 3.1 x10E3/uL 1.4  ?Monocytes Absolute 0.1 - 0.9 x10E3/uL 0.6  ?Basophils Absolute 0.0 - 0.2 x10E3/uL 0.1  ?Immature Grans (Abs) 0.0 - 0.1 x10E3/uL 0.0  ?Lymphs Not Estab. % 20  ?Monocytes Not Estab. % 8  ?Basos Not Estab. % 1  ?Eos Not Estab. % 7  ?EOS (ABSOLUTE) 0.0 - 0.4 x10E3/uL 0.5 (H)  ?TSH 0.450 - 4.500 uIU/mL 3.320  ?!: Data is abnormal ?(L): Data is abnormally low ?(H): Data is abnormally high ?Rpt: View report in Results Review for more information ?

## 2022-02-26 NOTE — Telephone Encounter (Signed)
Pt c/o Shortness Of Breath: STAT if SOB developed within the last 24 hours or pt is noticeably SOB on the phone ? ?1. Are you currently SOB (can you hear that pt is SOB on the phone)? Unsure daughter calling  ? ?2. How long have you been experiencing SOB? About a month  ? ?3. Are you SOB when sitting or when up moving around? Exertion only able to recover when at rest  ? ?4. Are you currently experiencing any other symptoms? Recent med change pcp   ?

## 2022-02-26 NOTE — Telephone Encounter (Signed)
Pl call pts daughter and have her mom take another l1/2 of her isinopril 40 mg  - half of this so 20 mg please.  ?thnx taking the lisinipril / hctz today would probably tank her pressures down, to take this in the morning only from tommorow .  ?If SOB is worsening needs to go to the ER

## 2022-02-26 NOTE — Telephone Encounter (Signed)
Able to return call to [name], she reports seen her PCP today for SOB that been lasting for about a month, more so with exertion, then feels better after resting.  ? ?While at appt, her BP noted to be 180/80, Dr. Dawna Part concerned and advised pt to call for same day appt with Dr. Rockey Situ. Currently no open appts available with any providers today. Next available appt to fill would be not till end of April. Pt agree for next appt with Dr. Rockey Situ 4/24 at 4:20 pm. Pt also placed on wait list to be called sooner if an availability comes open. ? ?Noted pt has not had ECHo in years, could be beneficial for ECHO as her SOB has been for about a month, Dr. Rockey Situ can discuss this option with her at her upcoming appt or her PCP may order ECHO and have completed before her appt with Dr. Rockey Situ. Dr. Rockey Situ will then be able to review results at her appt and advised on POC at that time.  ? ?Med adjustment made at her PCP appt today, lisinopril-hydrochlorothiazide 20-25 MG tablet daily in addition with her Toprol 25 mg daily. Advised to start her new med and monitor her BP/HR.  ? ?If Sob becomes worse, unable to catch her breath, CP, fatigue then may need to seek ER for evaluation, reach back out to PCP, or call office to see if there are any cancellations available to get pt in to be seen. Kelli Williams verbalized understanding. ? ?Otherwise all questions were address and no additional concerns at this time. Kelli Williams thankful for the return call and advice. Agreeable to plan, will call back for anything further.   ? ?

## 2022-02-26 NOTE — Progress Notes (Signed)
? ?BP (!) 180/80   Pulse (!) 52   Temp 98.3 ?F (36.8 ?C) (Oral)   Ht 5' 2.99" (1.6 m)   Wt 181 lb 3.2 oz (82.2 kg)   SpO2 97%   BMI 32.11 kg/m?   ? ?Subjective:  ? ? Patient ID: Kelli Williams, female    DOB: 07-02-1926, 88 y.o.   MRN: [id number] ? ?Chief Complaint  ?Patient presents with  ? paperwork  ?  Patient has paperwork that she would like to have filled out for home health care.   ? lab work  ?  Review labwork from last week ?  ? ? ?HPI: ?Kelli Williams is a 88 y.o. female ? ?Shortness of Breath ?This is a new problem. The current episode started 1 to 4 weeks ago. Pertinent negatives include no abdominal pain, chest pain, claudication, coryza, hemoptysis, leg pain, leg swelling, neck pain, rhinorrhea, sore throat, sputum production, swollen glands, syncope, vomiting or wheezing.  ?Hypertension ?This is a chronic problem. The problem has been waxing and waning since onset. Associated symptoms include shortness of breath. Pertinent negatives include no chest pain or neck pain.  ? ?Chief Complaint  ?Patient presents with  ? paperwork  ?  Patient has paperwork that she would like to have filled out for home health care.   ? lab work  ?  Review labwork from last week ?  ? ? ?Relevant past medical, surgical, family and social history reviewed and updated as indicated. Interim medical history since our last visit reviewed. ?Allergies and medications reviewed and updated. ? ?Review of Systems  ?HENT:  Negative for rhinorrhea and sore throat.   ?Respiratory:  Positive for shortness of breath. Negative for hemoptysis, sputum production and wheezing.   ?Cardiovascular:  Negative for chest pain, claudication, leg swelling and syncope.  ?Gastrointestinal:  Negative for abdominal pain and vomiting.  ?Musculoskeletal:  Negative for neck pain.  ? ?Per HPI unless specifically indicated above ? ?   ?Objective:  ?  ?BP (!) 180/80   Pulse (!) 52   Temp 98.3 ?F (36.8 ?C) (Oral)   Ht 5' 2.99" (1.6 m)   Wt 181 lb 3.2  oz (82.2 kg)   SpO2 97%   BMI 32.11 kg/m?   ?Wt Readings from Last 3 Encounters:  ?02/26/22 181 lb 3.2 oz (82.2 kg)  ?02/05/22 182 lb 3.2 oz (82.6 kg)  ?01/07/22 187 lb 2 oz (84.9 kg)  ?  ?Physical Exam ?Vitals and nursing note reviewed.  ?Constitutional:   ?   General: She is not in acute distress. ?   Appearance: Normal appearance. She is not ill-appearing or diaphoretic.  ?Eyes:  ?   Conjunctiva/sclera: Conjunctivae normal.  ?Cardiovascular:  ?   Rate and Rhythm: Normal rate and regular rhythm.  ?   Heart sounds: No murmur heard. ?  No friction rub.  ?Pulmonary:  ?   Effort: Pulmonary effort is normal. No respiratory distress.  ?   Breath sounds: Normal breath sounds. No rhonchi.  ?Abdominal:  ?   General: Abdomen is flat. Bowel sounds are normal. There is no distension.  ?   Palpations: Abdomen is soft.  ?   Tenderness: There is no abdominal tenderness. There is no guarding.  ?Skin: ?   General: Skin is warm and dry.  ?   Coloration: Skin is not jaundiced.  ?   Findings: No erythema.  ?Neurological:  ?   General: No focal deficit present.  ?   Mental Status:  She is alert and oriented to person, place, and time.  ?   Cranial Nerves: No cranial nerve deficit.  ?   Sensory: No sensory deficit.  ?   Motor: No weakness.  ?   Gait: Gait normal.  ?   Deep Tendon Reflexes: Reflexes normal.  ? ? ?Results for orders placed or performed in visit on 02/19/22  ?Microscopic Examination  ? Urine  ?Result Value Ref Range  ? WBC, UA 0-5 0 - 5 /hpf  ? RBC None seen 0 - 2 /hpf  ? Epithelial Cells (non renal) None seen 0 - 10 /hpf  ? Casts Present (A) None seen /lpf  ? Cast Type Hyaline casts N/A  ? Mucus, UA Present (A) Not Estab.  ? Bacteria, UA Few (A) None seen/Few  ?Urinalysis, Routine w reflex microscopic  ?Result Value Ref Range  ? Specific Gravity, UA 1.020 1.005 - 1.030  ? pH, UA 5.5 5.0 - 7.5  ? Color, UA Yellow Yellow  ? Appearance Ur Cloudy (A) Clear  ? Leukocytes,UA 1+ (A) Negative  ? Protein,UA 1+ (A) Negative/Trace   ? Glucose, UA Negative Negative  ? Ketones, UA Negative Negative  ? RBC, UA Negative Negative  ? Bilirubin, UA Negative Negative  ? Urobilinogen, Ur 0.2 0.2 - 1.0 mg/dL  ? Nitrite, UA Negative Negative  ? Microscopic Examination See below:   ?TSH  ?Result Value Ref Range  ? TSH 3.320 0.450 - 4.500 uIU/mL  ?Lipid panel  ?Result Value Ref Range  ? Cholesterol, Total 135 100 - 199 mg/dL  ? Triglycerides 86 0 - 149 mg/dL  ? HDL 49 >39 mg/dL  ? VLDL Cholesterol Cal 17 5 - 40 mg/dL  ? LDL Chol Calc (NIH) 69 0 - 99 mg/dL  ? Chol/HDL Ratio 2.8 0.0 - 4.4 ratio  ?Comprehensive metabolic panel  ?Result Value Ref Range  ? Glucose 91 70 - 99 mg/dL  ? BUN 25 10 - 36 mg/dL  ? Creatinine, Ser 1.00 0.57 - 1.00 mg/dL  ? eGFR 52 (L) >59 mL/min/1.73  ? BUN/Creatinine Ratio 25 12 - 28  ? Sodium 136 134 - 144 mmol/L  ? Potassium 4.3 3.5 - 5.2 mmol/L  ? Chloride 100 96 - 106 mmol/L  ? CO2 25 20 - 29 mmol/L  ? Calcium 10.0 8.7 - 10.3 mg/dL  ? Total Protein 6.8 6.0 - 8.5 g/dL  ? Albumin 4.1 3.5 - 4.6 g/dL  ? Globulin, Total 2.7 1.5 - 4.5 g/dL  ? Albumin/Globulin Ratio 1.5 1.2 - 2.2  ? Bilirubin Total 0.4 0.0 - 1.2 mg/dL  ? Alkaline Phosphatase 99 44 - 121 IU/L  ? AST 17 0 - 40 IU/L  ? ALT 9 0 - 32 IU/L  ?CBC with Differential/Platelet  ?Result Value Ref Range  ? WBC 7.1 3.4 - 10.8 x10E3/uL  ? RBC 4.23 3.77 - 5.28 x10E6/uL  ? Hemoglobin 12.7 11.1 - 15.9 g/dL  ? Hematocrit 37.9 34.0 - 46.6 %  ? MCV 90 79 - 97 fL  ? MCH 30.0 26.6 - 33.0 pg  ? MCHC 33.5 31.5 - 35.7 g/dL  ? RDW 13.4 11.7 - 15.4 %  ? Platelets 230 150 - 450 x10E3/uL  ? Neutrophils 64 Not Estab. %  ? Lymphs 20 Not Estab. %  ? Monocytes 8 Not Estab. %  ? Eos 7 Not Estab. %  ? Basos 1 Not Estab. %  ? Neutrophils Absolute 4.5 1.4 - 7.0 x10E3/uL  ? Lymphocytes Absolute 1.4 0.7 -  3.1 x10E3/uL  ? Monocytes Absolute 0.6 0.1 - 0.9 x10E3/uL  ? EOS (ABSOLUTE) 0.5 (H) 0.0 - 0.4 x10E3/uL  ? Basophils Absolute 0.1 0.0 - 0.2 x10E3/uL  ? Immature Granulocytes 0 Not Estab. %  ? Immature Grans  (Abs) 0.0 0.0 - 0.1 x10E3/uL  ? ?   ? ? ?Current Outpatient Medications:  ?  acetaminophen (TYLENOL) 325 MG tablet, Take 650 mg by mouth every 6 (six) hours as needed., Disp: , Rfl:  ?  aspirin 81 MG tablet, Take 1 tablet by mouth daily., Disp: , Rfl:  ?  Calcium-Magnesium 500-250 MG TABS, Take 1 tablet by mouth daily. , Disp: , Rfl:  ?  Cholecalciferol (VITAMIN D3) 25 MCG (1000 UT) CAPS, Take 1 capsule by mouth daily. , Disp: , Rfl:  ?  Coenzyme Q10 (CO Q-10) 100 MG CAPS, Take 1 tablet by mouth daily. , Disp: , Rfl:  ?  gabapentin (NEURONTIN) 300 MG capsule, TAKE 3 CAPSULES BY MOUTH  TWICE DAILY, Disp: 540 capsule, Rfl: 1 ?  letrozole (FEMARA) 2.5 MG tablet, TAKE 1 TABLET BY MOUTH  DAILY, Disp: 90 tablet, Rfl: 3 ?  Liniments (ACE PAIN RELIEVING PATCH EX), Apply topically. Ameo, Disp: , Rfl:  ?  lisinopril-hydrochlorothiazide (ZESTORETIC) 20-25 MG tablet, Take 1 tablet by mouth daily., Disp: 90 tablet, Rfl: 3 ?  meloxicam (MOBIC) 15 MG tablet, TAKE 1 TABLET BY MOUTH DAILY, Disp: 90 tablet, Rfl: 0 ?  metoprolol succinate (TOPROL-XL) 25 MG 24 hr tablet, Take 1 tablet (25 mg total) by mouth daily., Disp: 30 tablet, Rfl: 3 ?  Multiple Vitamins-Minerals (MULTIVITAMIN WITH MINERALS) tablet, Take 1 tablet by mouth daily., Disp: , Rfl:  ?  potassium chloride (KLOR-CON) 10 MEQ tablet, Take 1 tablet (10 mEq total) by mouth daily., Disp: 90 tablet, Rfl: 3 ?  ezetimibe (ZETIA) 10 MG tablet, Take 1 tablet (10 mg total) by mouth daily., Disp: 90 tablet, Rfl: 0 ?  fexofenadine (ALLEGRA ALLERGY) 180 MG tablet, Take 1 tablet (180 mg total) by mouth daily. (Patient not taking: Reported on 02/26/2022), Disp: 30 tablet, Rfl: 1 ?  omeprazole (PRILOSEC) 20 MG capsule, Take 1 capsule (20 mg total) by mouth daily., Disp: 90 capsule, Rfl: 2  ? ? ?Assessment & Plan:  ?Htn change to lisinopril 40 mg and add metoprolol xl 25 mg  ?Continue current meds.  Medication compliance emphasised. pt advised to keep Bp logs. Pt verbalised understanding of  the same. Pt to have a low salt diet . Exercise to reach a goal of at least 150 mins a week.  lifestyle modifications explained and pt understands importance of the above. ?Under good control on current regimen.

## 2022-02-27 NOTE — Telephone Encounter (Signed)
Requested Prescriptions  ?Pending Prescriptions Disp Refills  ?? meloxicam (MOBIC) 15 MG tablet [Pharmacy Med Name: Meloxicam 15 MG Oral Tablet] 90 tablet 3  ?  Sig: TAKE 1 TABLET BY MOUTH DAILY  ?  ? Analgesics:  COX2 Inhibitors Failed - 02/25/2022 10:40 PM  ?  ?  Failed - Manual Review: Labs are only required if the patient has taken medication for more than 8 weeks.  ?  ?  Passed - HGB in normal range and within 360 days  ?  Hemoglobin  ?Date Value Ref Range Status  ?02/19/2022 12.7 11.1 - 15.9 g/dL Final  ?   ?  ?  Passed - Cr in normal range and within 360 days  ?  Creatinine  ?Date Value Ref Range Status  ?02/06/2012 0.76 0.60 - 1.30 mg/dL Final  ? ?Creatinine, Ser  ?Date Value Ref Range Status  ?02/19/2022 1.00 0.57 - 1.00 mg/dL Final  ?   ?  ?  Passed - HCT in normal range and within 360 days  ?  Hematocrit  ?Date Value Ref Range Status  ?02/19/2022 37.9 34.0 - 46.6 % Final  ?   ?  ?  Passed - AST in normal range and within 360 days  ?  AST  ?Date Value Ref Range Status  ?02/19/2022 17 0 - 40 IU/L Final  ? ?SGOT(AST)  ?Date Value Ref Range Status  ?02/06/2012 19 15 - 37 Unit/L Final  ?   ?  ?  Passed - ALT in normal range and within 360 days  ?  ALT  ?Date Value Ref Range Status  ?02/19/2022 9 0 - 32 IU/L Final  ? ?SGPT (ALT)  ?Date Value Ref Range Status  ?02/06/2012 24 U/L Final  ?  Comment:  ?  12-78 ?NOTE: NEW REFERENCE RANGE ?11/01/2011 ?  ?   ?  ?  Passed - eGFR is 30 or above and within 360 days  ?  EGFR (African American)  ?Date Value Ref Range Status  ?02/06/2012 >60 >14m/min Final  ? ?GFR calc Af AWyvonnia Lora ?Date Value Ref Range Status  ?07/27/2020 60 (L) >60 mL/min Final  ? ?EGFR (Non-African Amer.)  ?Date Value Ref Range Status  ?02/06/2012 >60 >629mmin Final  ?  Comment:  ?  eGFR values <6028min/1.73 m2 may be an indication of chronic ?kidney disease (CKD). ?Calculated eGFR, using the MRDR Study equation, is useful in  ?patients with stable renal function. ?The eGFR calculation will not be reliable  in acutely ill patients ?when serum creatinine is changing rapidly. It is not useful in ?patients on dialysis. The eGFR calculation may not be applicable ?to patients at the low and high extremes of body sizes, pregnant ?women, and vegetarians. ?  ? ?GFR, Estimated  ?Date Value Ref Range Status  ?12/28/2021 54 (L) >60 mL/min Final  ?  Comment:  ?  (NOTE) ?Calculated using the CKD-EPI Creatinine Equation (2021) ?  ? ?eGFR  ?Date Value Ref Range Status  ?02/19/2022 52 (L) >59 mL/min/1.73 Final  ?   ?  ?  Passed - Patient is not pregnant  ?  ?  Passed - Valid encounter within last 12 months  ?  Recent Outpatient Visits   ?      ? Yesterday DNR (do not resuscitate)  ? [facility] Vigg, Avanti, MD  ? 3 weeks ago [facility]  ? 10 months ago Knee swelling  ? [facility] Glean Hess, MD  ? 1 year ago Essential hypertension  ? [facility] Glean Hess, MD  ? 2 years ago Essential hypertension  ? [facility] Glean Hess, MD  ?  ?  ?Future Appointments   ?        ? In 1 week Vigg, Avanti, MD [facility], [facility]  ? In 1 month Gollan, Kathlene November, MD [facility], [facility]  ?  ? ?  ?  ?  ? ?

## 2022-02-27 NOTE — Progress Notes (Signed)
If Dr. Ardiology Office Note ? ?Date:  02/28/2022  ? ?IDBETTEJANE LEAVENS, DOB 10/08/1926, MRN [id number] ? ?PCP:  Charlynne Cousins, MD  ? ?Chief Complaint  ?Patient presents with  ? Other  ?  Patient c.o SOB and swelling in ankles. Meds reviewed verbally with patient.   ? ? ?HPI:  ?Ms. Ratcliffe is a 88 year old woman  ?Mild aortic atherosclerosis ?Status post radiation treatment for breast cancer in 2000 on the left with 7 weeks of radiation on a daily basis.  ?She lost her husband in March 2016.  ? last echo 2011: normal EF 60%, ?who presents for routine followup of her tachycardia/palpitations, breast cancer ? ?LOV with myself January 2023 ? ?Chemo treatment stopped ? ?reports leg swelling, b/l ?Chronic leg swelling, ?Unable to place TE hose ?Significant sitting, recliner ?Denies drinking too much ? ?No chest pain, no SOB ? ?Seen by PMD ?Started on lisinopril 40, she stopped HCTZ last month ?Started on metoprolol succinate 25 daily ? ?Reports sinus congestion, PND, cough ? ?[place] at the [place] ?Lives in an apartment ?No regular exercise program, walks with a walker ? ?Followed by oncology for cancer ?CT scan,  ?No evidence of progressive metastatic disease. ?2. Unchanged lytic lesions of the thoracic and lumbar spine, ?compatible with osseous metastatic disease. ?3.  Aortic Atherosclerosis (ICD10-I70.0). ? ?mild to moderate aortic atherosclerosis ? ? lymph node under left arm ?resected, tested positive for breast cancer ?Developed metastases to the bone ?XRT  To bones,  chemotherapy ?PET scan showing improvement in her metastases ? ? CT scan ?coronary calcification and aortic atherosclerosis as well as cardiomegaly ? ?PET SCAN: ?Coronary, aortic arch, and branch vessel ?atherosclerotic vascular disease. Moderate cardiomegaly. ? at least mild to moderate diffuse aortic atherosclerosis extending up to the carotids, some degree of coronary calcification/difficult to determine secondary to motion artifact ? ?   ?PMH:   has a past medical history of Anxiety, Arthritis, Breast cancer ([place]) ([id number]), Complication of anesthesia, Dysrhythmia, GERD (gastroesophageal reflux disease), Hypertension, Neuropathic pain of both legs, Neuropathy, Personal history of chemotherapy, Personal history of radiation therapy, Pneumonia (2016), Skin cancer of forehead, and Skin cancer of nose. ? ?PSH:    ?Past Surgical History:  ?Procedure Laterality Date  ? ABDOMINAL HYSTERECTOMY    ? AXILLARY LYMPH NODE BIOPSY Left 07/10/2017  ? INVASIVE MAMMARY CARCINOMA WITH FOCAL LOBULAR FEATURES.   ? BREAST BIOPSY Bilateral 1960  ? benign  ? BREAST LUMPECTOMY Left 2000  ? breast ca with rad tx  ? BREAST LUMPECTOMY Left 08/20/2017  ? BREAST LUMPECTOMY Left 08/20/2017  ? Procedure: left breast wide excision;  Surgeon: Robert Bellow, MD;  Location: [facility] ORS;  Service: General;  Laterality: Left;  ? CATARACT EXTRACTION    ? left eye  ? CHOLECYSTECTOMY    ? JOINT REPLACEMENT    ? NASAL SEPTUM SURGERY    ? PARTIAL KNEE ARTHROPLASTY    ? right  ? TOTAL VAGINAL HYSTERECTOMY    ? ? ?Current Outpatient Medications  ?Medication Sig Dispense Refill  ? acetaminophen (TYLENOL) 325 MG tablet Take 650 mg by mouth every 6 (six) hours as needed.    ? aspirin 81 MG tablet Take 1 tablet by mouth daily.    ? Calcium-Magnesium 500-250 MG TABS Take 1 tablet by mouth daily.     ? Cholecalciferol (VITAMIN D3) 25 MCG (1000 UT) CAPS Take 1 capsule by mouth daily.     ? Coenzyme Q10 (CO Q-10) 100 MG CAPS Take  1 tablet by mouth daily.     ? ezetimibe (ZETIA) 10 MG tablet Take 1 tablet (10 mg total) by mouth daily. 90 tablet 0  ? gabapentin (NEURONTIN) 300 MG capsule TAKE 3 CAPSULES BY MOUTH  TWICE DAILY 540 capsule 1  ? letrozole (FEMARA) 2.5 MG tablet TAKE 1 TABLET BY MOUTH  DAILY 90 tablet 3  ? lisinopril-hydrochlorothiazide (ZESTORETIC) 20-25 MG tablet Take 1 tablet by mouth daily. 90 tablet 3  ? meloxicam (MOBIC) 15 MG tablet TAKE 1 TABLET BY MOUTH DAILY 90 tablet 3  ?  metoprolol succinate (TOPROL-XL) 25 MG 24 hr tablet Take 1 tablet (25 mg total) by mouth daily. 30 tablet 3  ? Multiple Vitamins-Minerals (MULTIVITAMIN WITH MINERALS) tablet Take 1 tablet by mouth daily.    ? omeprazole (PRILOSEC) 20 MG capsule Take 1 capsule (20 mg total) by mouth daily. 90 capsule 2  ? potassium chloride (KLOR-CON) 10 MEQ tablet Take 1 tablet (10 mEq total) by mouth daily. 90 tablet 3  ? ?No current facility-administered medications for this visit.  ? ? ? ?Allergies:   Amitriptyline, Amoxicillin, Black pepper [piper], Ivp dye [iodinated contrast media], Tape, and Sulfa antibiotics  ? ?Social History:  The patient  reports that she has never smoked. She has never used smokeless tobacco. She reports that she does not drink alcohol and does not use drugs.  ? ?Family History:   family history includes Breast cancer (age of onset: 65) in her daughter; Cancer in her paternal grandmother; Heart attack in her paternal uncle; Heart disease in her mother.  ? ? ?Review of Systems: ?Review of Systems  ?HENT:  Positive for congestion.   ?Respiratory: Negative.    ?Cardiovascular:  Positive for leg swelling.  ?Gastrointestinal: Negative.   ?Musculoskeletal:  Positive for joint pain.  ?     Difficulty walking  ?Neurological: Negative.   ?Psychiatric/Behavioral: Negative.    ?All other systems reviewed and are negative. ? ?PHYSICAL EXAM: ?VS:  BP (!) 146/80 (BP Location: Right Arm, Patient Position: Sitting, Cuff Size: Normal)   Pulse (!) 56   Ht '5\' 3"'$  (1.6 m)   Wt 182 lb (82.6 kg)   SpO2 98%   BMI 32.24 kg/m?  , BMI Body mass index is 32.24 kg/m?Marland Kitchen ?Constitutional:  oriented to person, place, and time. No distress.  ?HENT:  ?Head: Grossly normal ?Eyes:  no discharge. No scleral icterus.  ?Neck: No JVD, no carotid bruits  ?Cardiovascular: Regular rate and rhythm, no murmurs appreciated ?Pulmonary/Chest: Clear to auscultation bilaterally, no wheezes or rails ?Abdominal: Soft.  no distension.  no tenderness.   ?Musculoskeletal: Normal range of motion ?Neurological:  normal muscle tone. Coordination normal. No atrophy ?Skin: Skin warm and dry ?Psychiatric: normal affect, pleasant ? ?Recent Labs: ?02/19/2022: ALT 9; BUN 25; Creatinine, Ser 1.00; Hemoglobin 12.7; Platelets 230; Potassium 4.3; Sodium 136; TSH 3.320  ? ? ?Lipid Panel ?Lab Results  ?Component Value Date  ? CHOL 135 02/19/2022  ? HDL 49 02/19/2022  ? [place] 69 02/19/2022  ? TRIG 86 02/19/2022  ? ? ?Wt Readings from Last 3 Encounters:  ?02/28/22 182 lb (82.6 kg)  ?02/26/22 181 lb 3.2 oz (82.2 kg)  ?02/05/22 182 lb 3.2 oz (82.6 kg)  ?  ? ?ASSESSMENT AND PLAN: ? ?Essential hypertension - ?She is currently taking lisinopril 40 daily, metoprolol succinate 25 daily ?Feels leg swelling is worse without the HCTZ, blood pressure continues to run high ?Previously was taking 37.5 mg daily of HCTZ.  Renal function minimally  elevated ?Recommended she restart HCTZ 25 mg daily ?Echocardiogram ordered ? ?Atrial tachycardia ([place]) - Plan: EKG 12-Lead ?No sx, off b-blocker ?Maintinaing NSR ? ?Adjustment disorder with other symptom  ?loss of her husband ?[place] ? ?Leg swelling -  ?Lymphedema, unable to place TED hose ?suggest leg elevation ?Not particularly interested in lymphedema compression pumps ?Echo pending ? ?Breast cancer ?Previously treated with chemotherapy medication has completed XRT ?Hx of metastases to bone ?Recent CT reviewed ? ?Aortic atherosclerosis/hyperlipidemia ?Mild-to-moderate in nature ?Continue  Zetia 10 mg daily ? ?Coronary calcifiction ?Seen on CT scan ?Also mild ortic athero ?Continue zetia ? ? ? Total encounter time more than 30 minutes ? Greater than 50% was spent in counseling and coordination of care with the patient ? ? ? ?No orders of the defined types were placed in this encounter. ? ? ? ?Signed, ?Esmond Plants, M.D., Ph.D. ?02/28/2022  ?[place] ?[phone] ? ?

## 2022-02-27 NOTE — Telephone Encounter (Signed)
Already sent in by provider on 02/26/22. Will refuse this request. ? ? ?Requested Prescriptions  ?Refused Prescriptions Disp Refills  ?? omeprazole (PRILOSEC) 20 MG capsule [Pharmacy Med Name: Omeprazole 20 MG Oral Capsule Delayed Release] 90 capsule 3  ?  Sig: TAKE 1 CAPSULE BY MOUTH DAILY  ?  ? Gastroenterology: Proton Pump Inhibitors Passed - 02/26/2022  5:37 AM  ?  ?  Passed - Valid encounter within last 12 months  ?  Recent Outpatient Visits   ?      ? Yesterday DNR (do not resuscitate)  ? [facility] Vigg, Avanti, MD  ? 3 weeks ago Encounter to establish care  ? [facility] Vigg, Avanti, MD  ? 10 months ago Knee swelling  ? [facility] Glean Hess, MD  ? 1 year ago Essential hypertension  ? [facility] Glean Hess, MD  ? 2 years ago Essential hypertension  ? [facility] Glean Hess, MD  ?  ?  ?Future Appointments   ?        ? Tomorrow [name], MD [facility], [facility]  ? In 1 week Vigg, Avanti, MD [facility], [facility]  ? In 1 month Gollan, Kathlene November, MD [facility], [facility]  ?  ? ?  ?  ?  ? ? ?

## 2022-02-28 ENCOUNTER — Ambulatory Visit (INDEPENDENT_AMBULATORY_CARE_PROVIDER_SITE_OTHER): Payer: Medicare Other | Admitting: Cardiovascular Disease

## 2022-02-28 ENCOUNTER — Other Ambulatory Visit: Payer: Self-pay

## 2022-02-28 ENCOUNTER — Encounter: Payer: Self-pay | Admitting: Cardiovascular Disease

## 2022-02-28 ENCOUNTER — Telehealth: Payer: Self-pay

## 2022-02-28 VITALS — BP 146/80 | HR 56 | Ht 63.0 in | Wt 182.0 lb

## 2022-02-28 DIAGNOSIS — I739 Peripheral vascular disease, unspecified: Secondary | ICD-10-CM | POA: Diagnosis not present

## 2022-02-28 DIAGNOSIS — I700 Atherosclerosis of aorta: Secondary | ICD-10-CM | POA: Diagnosis not present

## 2022-02-28 DIAGNOSIS — R600 Localized edema: Secondary | ICD-10-CM

## 2022-02-28 DIAGNOSIS — I2510 Atherosclerotic heart disease of native coronary artery without angina pectoris: Secondary | ICD-10-CM

## 2022-02-28 DIAGNOSIS — I10 Essential (primary) hypertension: Secondary | ICD-10-CM

## 2022-02-28 MED ORDER — HYDROCHLOROTHIAZIDE 25 MG PO TABS: 25.0000 mg | ORAL_TABLET | Freq: Every day | ORAL | 1 refills | Status: DC

## 2022-02-28 MED ORDER — HYDROCHLOROTHIAZIDE 25 MG PO TABS: 25.0000 mg | ORAL_TABLET | Freq: Every day | ORAL | 3 refills | Status: DC

## 2022-02-28 NOTE — Telephone Encounter (Signed)
Informed patient that paperwork has been completed and is at front desk ready for pick up ?

## 2022-02-28 NOTE — Patient Instructions (Addendum)
Medication Instructions:  ?- Your physician has recommended you make the following change in your medication:  ? ?1) Restart HCTZ (hydrochlorothiazide) 25 mg: ?- take 1 tablet by mouth once daily ? ?2) Stay on lisinopril 40 mg once daily ? ?3) Stay on metoprolol succinate 25 mg once daily ? ?If you need a refill on your cardiac medications before your next appointment, please call your pharmacy.  ? ?Lab work: ?No new labs needed ? ?Testing/Procedures: ? ?1) Echocardiogram: ?- Your physician has requested that you have an echocardiogram. Echocardiography is a painless test that uses sound waves to create images of your heart. It provides your doctor with information about the size and shape of your heart and how well your heart?s chambers and valves are working. This procedure takes approximately one hour. There are no restrictions for this procedure. There is a possibility that an IV may need to be started during your test to inject an image enhancing agent. This is done to obtain more optimal pictures of your heart. Therefore we ask that you do at least drink some water prior to coming in to hydrate your veins.  ? ? ?Follow-Up: ?At [facility], you and your health needs are our priority.  As part of our continuing mission to provide you with exceptional heart care, we have created designated Provider Care Teams.  These Care Teams include your primary Cardiologist (physician) and Advanced Practice Providers (APPs -  Physician Assistants and Nurse Practitioners) who all work together to provide you with the care you need, when you need it. ? ?You will need a follow up appointment in 6  months, APP ok ? ?Providers on your designated Care Team:   ?Murray Hodgkins, NP ?Christell Faith, PA-C ?Cadence Kathlen Mody, PA-C ? ?COVID-19 Vaccine Information can be found at: [email or web address] For questions related to vaccine distribution or appointments, please email  [email or web address] or call [phone].  ? ?

## 2022-03-12 ENCOUNTER — Encounter: Payer: Self-pay | Admitting: Internal Medicine

## 2022-03-12 ENCOUNTER — Ambulatory Visit (INDEPENDENT_AMBULATORY_CARE_PROVIDER_SITE_OTHER): Payer: Medicare Other | Admitting: Internal Medicine

## 2022-03-12 VITALS — BP 170/60 | HR 61 | Temp 98.1°F | Ht 62.99 in | Wt 186.2 lb

## 2022-03-12 DIAGNOSIS — G8929 Other chronic pain: Secondary | ICD-10-CM

## 2022-03-12 DIAGNOSIS — R519 Headache, unspecified: Secondary | ICD-10-CM | POA: Diagnosis not present

## 2022-03-12 DIAGNOSIS — B351 Tinea unguium: Secondary | ICD-10-CM | POA: Diagnosis not present

## 2022-03-12 NOTE — Progress Notes (Signed)
? ?BP (!) 170/60   Pulse 61   Temp 98.1 ?F (36.7 ?C) (Oral)   Ht 5' 2.99" (1.6 m)   Wt 186 lb 3.2 oz (84.5 kg)   SpO2 92%   BMI 32.99 kg/m?   ? ?Subjective:  ? ? Patient ID: Kelli Williams, female    DOB: May 22, 1926, 88 y.o.   MRN: [id number] ? ?Chief Complaint  ?Patient presents with  ?? Hypertension  ?  Dr. Rockey Situ, added HCTZ 60m for BP  ?? Headache  ?  Wakes with headaches at night  ?? Toe Pain  ?  Right big toe is black and blue for the last several weeks.   ? ? ?HPI: ?Kelli LICCIARDIis a 88y.o. female ? ?Hypertension ?This is a chronic problem. The current episode started more than 1 year ago. Associated symptoms include headaches. Pertinent negatives include no blurred vision or neck pain.  ?Headache  ?This is a chronic (pt has flonase however hasnt been using such.) problem. The current episode started 1 to 4 weeks ago. The problem has been gradually worsening. The pain is located in the Bilateral region. The pain does not radiate. The quality of the pain is described as aching. The pain is at a severity of 4/10. The pain is mild. Associated symptoms include sinus pressure. Pertinent negatives include no back pain, blurred vision, coughing, dizziness, drainage, ear pain, eye redness, hearing loss, insomnia, loss of balance, muscle aches, nausea, neck pain, numbness, sore throat, swollen glands, tingling, tinnitus, visual change, vomiting or weakness. Her past medical history is significant for hypertension.  ?Toe Pain  ?The incident occurred more than 1 week ago (co discoloration of her toe nails). Pertinent negatives include no numbness or tingling.  ? ? ?Chief Complaint  ?Patient presents with  ?? Hypertension  ?  Dr. GRockey Situ added HCTZ 288mfor BP  ?? Headache  ?  Wakes with headaches at night  ?? Toe Pain  ?  Right big toe is black and blue for the last several weeks.   ? ? ?Relevant past medical, surgical, family and social history reviewed and updated as indicated. Interim medical history  since our last visit reviewed. ?Allergies and medications reviewed and updated. ? ?Review of Systems  ?HENT:  Positive for sinus pressure. Negative for ear pain, hearing loss, sore throat and tinnitus.   ?Eyes:  Negative for blurred vision and redness.  ?Respiratory:  Negative for cough.   ?Gastrointestinal:  Negative for nausea and vomiting.  ?Musculoskeletal:  Negative for back pain and neck pain.  ?Neurological:  Positive for headaches. Negative for dizziness, tingling, weakness, numbness and loss of balance.  ?Psychiatric/Behavioral:  The patient does not have insomnia.   ? ?Per HPI unless specifically indicated above ? ?   ?Objective:  ?  ?BP (!) 170/60   Pulse 61   Temp 98.1 ?F (36.7 ?C) (Oral)   Ht 5' 2.99" (1.6 m)   Wt 186 lb 3.2 oz (84.5 kg)   SpO2 92%   BMI 32.99 kg/m?   ?Wt Readings from Last 3 Encounters:  ?03/12/22 186 lb 3.2 oz (84.5 kg)  ?02/28/22 182 lb (82.6 kg)  ?02/26/22 181 lb 3.2 oz (82.2 kg)  ?  ?Physical Exam ?Vitals and nursing note reviewed.  ?Constitutional:   ?   General: She is not in acute distress. ?   Appearance: Normal appearance. She is not ill-appearing or diaphoretic.  ?Eyes:  ?   Conjunctiva/sclera: Conjunctivae normal.  ?Cardiovascular:  ?  Rate and Rhythm: Normal rate and regular rhythm.  ?   Heart sounds: No murmur heard. ?  No friction rub. No gallop.  ?Pulmonary:  ?   Effort: No respiratory distress.  ?   Breath sounds: No stridor. No wheezing, rhonchi or rales.  ?Chest:  ?   Chest wall: No tenderness.  ?Abdominal:  ?   General: Abdomen is flat. Bowel sounds are normal. There is no distension.  ?   Palpations: Abdomen is soft. There is no mass.  ?   Tenderness: There is no abdominal tenderness. There is no guarding.  ?Musculoskeletal:     ?   General: No swelling or tenderness.  ?Skin: ?   General: Skin is warm and dry.  ?   Coloration: Skin is not jaundiced.  ?   Findings: No erythema.  ?Neurological:  ?   Mental Status: She is alert and oriented to person, place, and  time.  ?Psychiatric:     ?   Mood and Affect: Mood is not anxious or depressed.     ?   Behavior: Behavior is not agitated.     ?   Cognition and Memory: Cognition is not impaired.  ? ? ?Results for orders placed or performed in visit on 02/19/22  ?Microscopic Examination  ? Urine  ?Result Value Ref Range  ? WBC, UA 0-5 0 - 5 /hpf  ? RBC None seen 0 - 2 /hpf  ? Epithelial Cells (non renal) None seen 0 - 10 /hpf  ? Casts Present (A) None seen /lpf  ? Cast Type Hyaline casts N/A  ? Mucus, UA Present (A) Not Estab.  ? Bacteria, UA Few (A) None seen/Few  ?Urinalysis, Routine w reflex microscopic  ?Result Value Ref Range  ? Specific Gravity, UA 1.020 1.005 - 1.030  ? pH, UA 5.5 5.0 - 7.5  ? Color, UA Yellow Yellow  ? Appearance Ur Cloudy (A) Clear  ? Leukocytes,UA 1+ (A) Negative  ? Protein,UA 1+ (A) Negative/Trace  ? Glucose, UA Negative Negative  ? Ketones, UA Negative Negative  ? RBC, UA Negative Negative  ? Bilirubin, UA Negative Negative  ? Urobilinogen, Ur 0.2 0.2 - 1.0 mg/dL  ? Nitrite, UA Negative Negative  ? Microscopic Examination See below:   ?TSH  ?Result Value Ref Range  ? TSH 3.320 0.450 - 4.500 uIU/mL  ?Lipid panel  ?Result Value Ref Range  ? Cholesterol, Total 135 100 - 199 mg/dL  ? Triglycerides 86 0 - 149 mg/dL  ? HDL 49 >39 mg/dL  ? VLDL Cholesterol Cal 17 5 - 40 mg/dL  ? LDL Chol Calc (NIH) 69 0 - 99 mg/dL  ? Chol/HDL Ratio 2.8 0.0 - 4.4 ratio  ?Comprehensive metabolic panel  ?Result Value Ref Range  ? Glucose 91 70 - 99 mg/dL  ? BUN 25 10 - 36 mg/dL  ? Creatinine, Ser 1.00 0.57 - 1.00 mg/dL  ? eGFR 52 (L) >59 mL/min/1.73  ? BUN/Creatinine Ratio 25 12 - 28  ? Sodium 136 134 - 144 mmol/L  ? Potassium 4.3 3.5 - 5.2 mmol/L  ? Chloride 100 96 - 106 mmol/L  ? CO2 25 20 - 29 mmol/L  ? Calcium 10.0 8.7 - 10.3 mg/dL  ? Total Protein 6.8 6.0 - 8.5 g/dL  ? Albumin 4.1 3.5 - 4.6 g/dL  ? Globulin, Total 2.7 1.5 - 4.5 g/dL  ? Albumin/Globulin Ratio 1.5 1.2 - 2.2  ? Bilirubin Total 0.4 0.0 - 1.2 mg/dL  ? Alkaline  Phosphatase 99 44 - 121 IU/L  ? AST 17 0 - 40 IU/L  ? ALT 9 0 - 32 IU/L  ?CBC with Differential/Platelet  ?Result Value Ref Range  ? WBC 7.1 3.4 - 10.8 x10E3/uL  ? RBC 4.23 3.77 - 5.28 x10E6/uL  ? Hemoglobin 12.7 11.1 - 15.9 g/dL  ? Hematocrit 37.9 34.0 - 46.6 %  ? MCV 90 79 - 97 fL  ? MCH 30.0 26.6 - 33.0 pg  ? MCHC 33.5 31.5 - 35.7 g/dL  ? RDW 13.4 11.7 - 15.4 %  ? Platelets 230 150 - 450 x10E3/uL  ? Neutrophils 64 Not Estab. %  ? Lymphs 20 Not Estab. %  ? Monocytes 8 Not Estab. %  ? Eos 7 Not Estab. %  ? Basos 1 Not Estab. %  ? Neutrophils Absolute 4.5 1.4 - 7.0 x10E3/uL  ? Lymphocytes Absolute 1.4 0.7 - 3.1 x10E3/uL  ? Monocytes Absolute 0.6 0.1 - 0.9 x10E3/uL  ? EOS (ABSOLUTE) 0.5 (H) 0.0 - 0.4 x10E3/uL  ? Basophils Absolute 0.1 0.0 - 0.2 x10E3/uL  ? Immature Granulocytes 0 Not Estab. %  ? Immature Grans (Abs) 0.0 0.0 - 0.1 x10E3/uL  ? ?   ? ? ?Current Outpatient Medications:  ??  acetaminophen (TYLENOL) 325 MG tablet, Take 650 mg by mouth every 6 (six) hours as needed., Disp: , Rfl:  ??  aspirin 81 MG tablet, Take 1 tablet by mouth daily., Disp: , Rfl:  ??  Calcium-Magnesium 500-250 MG TABS, Take 1 tablet by mouth daily. , Disp: , Rfl:  ??  ezetimibe (ZETIA) 10 MG tablet, Take 1 tablet (10 mg total) by mouth daily., Disp: 90 tablet, Rfl: 0 ??  gabapentin (NEURONTIN) 300 MG capsule, TAKE 3 CAPSULES BY MOUTH  TWICE DAILY, Disp: 540 capsule, Rfl: 1 ??  hydrochlorothiazide (HYDRODIURIL) 25 MG tablet, Take 1 tablet (25 mg total) by mouth daily., Disp: 90 tablet, Rfl: 3 ??  letrozole (FEMARA) 2.5 MG tablet, TAKE 1 TABLET BY MOUTH  DAILY, Disp: 90 tablet, Rfl: 3 ??  lisinopril (ZESTRIL) 40 MG tablet, Take 1 tablet (40 mg) by mouth once daily, Disp: , Rfl:  ??  meloxicam (MOBIC) 15 MG tablet, TAKE 1 TABLET BY MOUTH DAILY, Disp: 90 tablet, Rfl: 3 ??  metoprolol succinate (TOPROL-XL) 25 MG 24 hr tablet, Take 1 tablet (25 mg total) by mouth daily., Disp: 30 tablet, Rfl: 3 ??  Multiple Vitamins-Minerals (MULTIVITAMIN WITH  MINERALS) tablet, Take 1 tablet by mouth daily., Disp: , Rfl:  ??  omeprazole (PRILOSEC) 20 MG capsule, Take 1 capsule (20 mg total) by mouth daily., Disp: 90 capsule, Rfl: 2 ??  potassium chloride (KLOR-CON) 10 ME

## 2022-03-15 ENCOUNTER — Other Ambulatory Visit: Payer: Medicare Other

## 2022-03-18 ENCOUNTER — Telehealth: Payer: Self-pay | Admitting: Cardiovascular Disease

## 2022-03-18 NOTE — Telephone Encounter (Signed)
hydrochlorothiazide (HYDRODIURIL) 25 MG tablet 90 tablet 3 02/28/2022 05/29/2022   ?Sig - Route: Take 1 tablet (25 mg total) by mouth daily. - Oral   ?Sent to pharmacy as: hydrochlorothiazide (HYDRODIURIL) 25 MG tablet   ?E-Prescribing Status: Receipt confirmed by pharmacy (02/28/2022  2:07 PM EDT)   ? ?Pharmacy ? ?[facility] - [place], [place]  ? ?

## 2022-03-18 NOTE — Telephone Encounter (Signed)
?*  STAT* If patient is at the pharmacy, call can be transferred to refill team. ? ? ?1. Which medications need to be refilled? (please list name of each medication and dose if known) hydrochlorothiazide (HYDRODIURIL) 25 MG tablet ? ?2. Which pharmacy/location (including street and city if local pharmacy) is medication to be sent to? [facility] ([place], [place] ? ?3. Do they need a 30 day or 90 day supply? 90 day ?

## 2022-03-18 NOTE — Telephone Encounter (Signed)
Pt c/o BP issue: STAT if pt c/o blurred vision, one-sided weakness or slurred speech ? ?1. What are your last 5 BP readings? 157/69, 130/58 HR 43, Sat 157/110 HR 42, Sun 12:15pm 157/108, 2:45pm 158/111 ? ?2. Are you having any other symptoms (ex. Dizziness, headache, blurred vision, passed out)? headaches ? ?3. What is your BP issue? Patient's daughter says the patient's BP have been very high. ?

## 2022-03-18 NOTE — Telephone Encounter (Signed)
Spoke with pt daughter, she reports the blood pressures are after taking her medications. And her heart rate has been around 43-42 bpm. Her swelling is pretty much the same as it was prior to restarting the HCTZ. She reports the patient has had headaches for sometime now but they have never said that they were related to her blood pressure. Aware will forward for dr gollan's review. ?

## 2022-03-19 MED ORDER — HYDROCHLOROTHIAZIDE 25 MG PO TABS: 25.0000 mg | ORAL_TABLET | Freq: Every day | ORAL | 3 refills | Status: DC

## 2022-03-19 MED ORDER — DOXAZOSIN MESYLATE 1 MG PO TABS: 1.0000 mg | ORAL_TABLET | Freq: Two times a day (BID) | ORAL | 3 refills | Status: DC

## 2022-03-19 NOTE — Telephone Encounter (Signed)
[name], MD  You 14 hours ago (5:41 PM)  ? ?Given low heart rate I would stop the metoprolol  ?Would start Cardura 1 mg twice daily for high blood pressure  ?Thx  ?TG   ? ?Called and spoke with pt's daughter, per DPR. Went over MDs reccs. Pt will:  ? ?- stop metoprolol ?- start cardura 1 mg BID ?- continue to monitor BP and HR  ? ?Daughter verbalized understanding and voiced appreciation for the call.  ?

## 2022-03-21 ENCOUNTER — Encounter: Payer: Self-pay | Admitting: Podiatry

## 2022-03-21 ENCOUNTER — Ambulatory Visit: Payer: Medicare Other | Admitting: Podiatry

## 2022-03-21 DIAGNOSIS — B351 Tinea unguium: Secondary | ICD-10-CM | POA: Diagnosis not present

## 2022-03-21 DIAGNOSIS — M79675 Pain in left toe(s): Secondary | ICD-10-CM | POA: Diagnosis not present

## 2022-03-21 DIAGNOSIS — M79674 Pain in right toe(s): Secondary | ICD-10-CM | POA: Diagnosis not present

## 2022-03-21 NOTE — Progress Notes (Signed)
?  Subjective:  ?Patient ID: Kelli Williams, female    DOB: 05-02-1926,  MRN: [id number] ? ?Chief Complaint  ?Patient presents with  ? Nail Problem  ? ?88 y.o. female returns for the above complaint.  Patient presents with thickened elongated dystrophic toenails x10.  Pain on palpation.  Patient would like to have them debrided down.  She is not able to do it herself.  She denies any other acute complaints. ? ?Objective:  ?There were no vitals filed for this visit. ?Podiatric Exam: ?Vascular: dorsalis pedis and posterior tibial pulses are palpable bilateral. Capillary return is immediate. Temperature gradient is WNL. Skin turgor WNL  ?Sensorium: Normal Semmes Weinstein monofilament test. Normal tactile sensation bilaterally. ?Nail Exam: Pt has thick disfigured discolored nails with subungual debris noted bilateral entire nail hallux through fifth toenails.  Pain on palpation to the nails. ?Ulcer Exam: There is no evidence of ulcer or pre-ulcerative changes or infection. ?Orthopedic Exam: Muscle tone and strength are WNL. No limitations in general ROM. No crepitus or effusions noted.  ?Skin: No Porokeratosis. No infection or ulcers ? ? ? ?Assessment & Plan:  ? ?1. Pain due to onychomycosis of toenails of both feet   ? ? ?Patient was evaluated and treated and all questions answered. ? ?Onychomycosis with pain  ?-Nails palliatively debrided as below. ?-Educated on self-care ? ?Procedure: Nail Debridement ?Rationale: pain  ?Type of Debridement: manual, sharp debridement. ?Instrumentation: Nail nipper, rotary burr. ?Number of Nails: 10 ? ?Procedures and Treatment: Consent by patient was obtained for treatment procedures. The patient understood the discussion of treatment and procedures well. All questions were answered thoroughly reviewed. Debridement of mycotic and hypertrophic toenails, 1 through 5 bilateral and clearing of subungual debris. No ulceration, no infection noted.  ?Return Visit-Office Procedure: Patient  instructed to return to the office for a follow up visit 3 months for continued evaluation and treatment. ? ?Boneta Lucks, DPM ?  ? ?No follow-ups on file. ? ?

## 2022-03-26 ENCOUNTER — Other Ambulatory Visit: Payer: Self-pay | Admitting: Internal Medicine

## 2022-03-26 DIAGNOSIS — G5793 Unspecified mononeuropathy of bilateral lower limbs: Secondary | ICD-10-CM

## 2022-03-27 ENCOUNTER — Ambulatory Visit (INDEPENDENT_AMBULATORY_CARE_PROVIDER_SITE_OTHER): Payer: Medicare Other

## 2022-03-27 ENCOUNTER — Other Ambulatory Visit: Payer: Self-pay

## 2022-03-27 ENCOUNTER — Telehealth: Payer: Self-pay | Admitting: Cardiovascular Disease

## 2022-03-27 DIAGNOSIS — R600 Localized edema: Secondary | ICD-10-CM | POA: Diagnosis not present

## 2022-03-27 LAB — ECHOCARDIOGRAM COMPLETE
AR max vel: 2.65 cm2
AV Area VTI: 2.64 cm2
AV Area mean vel: 2.53 cm2
AV Mean grad: 4 mmHg
AV Peak grad: 7.3 mmHg
AV Vena cont: 0.5 cm
Ao pk vel: 1.35 m/s
Area-P 1/2: 3.08 cm2
Calc EF: 59.1 %
S' Lateral: 2.5 cm
Single Plane A2C EF: 65.3 %
Single Plane A4C EF: 54.7 %

## 2022-03-27 MED ORDER — DOXAZOSIN MESYLATE 1 MG PO TABS: 1.0000 mg | ORAL_TABLET | Freq: Two times a day (BID) | ORAL | 3 refills | Status: DC

## 2022-03-27 MED ORDER — HYDROCHLOROTHIAZIDE 25 MG PO TABS: 25.0000 mg | ORAL_TABLET | Freq: Every day | ORAL | 3 refills | Status: DC

## 2022-03-27 MED ORDER — POTASSIUM CHLORIDE ER 10 MEQ PO TBCR: 10.0000 meq | EXTENDED_RELEASE_TABLET | Freq: Every day | ORAL | 3 refills | Status: DC

## 2022-03-27 NOTE — Telephone Encounter (Signed)
?*  STAT* If patient is at the pharmacy, call can be transferred to refill team. ? ? ?1. Which medications need to be refilled? (please list name of each medication and dose if known) Hydrochlorothiazide '25mg'$  1 tablet daily ? ?2. Which pharmacy/location (including street and city if local pharmacy) is medication to be sent to? 90 day ? ?3. Do they need a 30 day or 90 day supply? [facility] ?

## 2022-03-27 NOTE — Telephone Encounter (Signed)
?*  STAT* If patient is at the pharmacy, call can be transferred to refill team. ? ? ?1. Which medications need to be refilled? (please list name of each medication and dose if known) Hydrochlorothiazide '25mg'$ , Potassium chloride 80mq tablet, Doxazosin '1mg'$   ? ?2. Which pharmacy/location (including street and city if local pharmacy) is medication to be sent to?[facility] ? ?3. Do they need a 30 day or 90 day supply? 30 day supply ?

## 2022-03-27 NOTE — Telephone Encounter (Signed)
Requested Prescriptions  ? ?Signed Prescriptions Disp Refills  ? hydrochlorothiazide (HYDRODIURIL) 25 MG tablet 60 tablet 3  ?  Sig: Take 1 tablet (25 mg total) by mouth daily.  ?  Authorizing Provider: [name]  ?  Ordering User: Janan Ridge  ? doxazosin (CARDURA) 1 MG tablet 60 tablet 3  ?  Sig: Take 1 tablet (1 mg total) by mouth 2 (two) times daily.  ?  Authorizing Provider: [name]  ?  Ordering User: Janan Ridge  ? potassium chloride (KLOR-CON) 10 MEQ tablet 90 tablet 3  ?  Sig: Take 1 tablet (10 mEq total) by mouth daily.  ?  Authorizing Provider: [name]  ?  Ordering User: Janan Ridge  ? ?

## 2022-03-27 NOTE — Telephone Encounter (Signed)
Requested Prescriptions  ?Pending Prescriptions Disp Refills  ?? gabapentin (NEURONTIN) 300 MG capsule [Pharmacy Med Name: Gabapentin 300 MG Oral Capsule] 540 capsule 2  ?  Sig: TAKE 3 CAPSULES BY MOUTH TWICE  DAILY  ?  ? Neurology: Anticonvulsants - gabapentin Passed - 03/26/2022 11:13 PM  ?  ?  Passed - Cr in normal range and within 360 days  ?  Creatinine  ?Date Value Ref Range Status  ?02/06/2012 0.76 0.60 - 1.30 mg/dL Final  ? ?Creatinine, Ser  ?Date Value Ref Range Status  ?02/19/2022 1.00 0.57 - 1.00 mg/dL Final  ?   ?  ?  Passed - Completed PHQ-2 or PHQ-9 in the last 360 days  ?  ?  Passed - Valid encounter within last 12 months  ?  Recent Outpatient Visits   ?      ? 2 weeks ago Chronic intractable headache, unspecified headache type  ? [facility] Vigg, Avanti, MD  ? 4 weeks ago DNR (do not resuscitate)  ? [place], [place]  ? 1 month ago Encounter to establish care  ? [facility] Vigg, Avanti, MD  ? 11 months ago Knee swelling  ? [facility] Glean Hess, MD  ? 1 year ago Essential hypertension  ? [facility] Glean Hess, MD  ?  ?  ?Future Appointments   ?        ? In 3 months Vigg, Avanti, MD [facility], [facility]  ? In 5 months Gollan, Kathlene November, MD [facility], [facility]  ?  ? ?  ?  ?  ? ? ?

## 2022-03-29 ENCOUNTER — Telehealth: Payer: Self-pay | Admitting: Emergency Medicine

## 2022-03-29 NOTE — Telephone Encounter (Signed)
-----   Message from [name], MD sent at 03/28/2022  5:26 PM EDT ----- ?Echocardiogram ?Low normal ejection fraction ?RV normal size and function ?No significant valvular heart disease ?No indication of fluid overload ?

## 2022-03-29 NOTE — Telephone Encounter (Signed)
Called and spoke with patient. Results reviewed with patient, pt verbalized understanding,  questions (if any) answered.   ?

## 2022-04-01 ENCOUNTER — Ambulatory Visit: Payer: Medicare Other | Admitting: Cardiovascular Disease

## 2022-04-04 ENCOUNTER — Encounter: Payer: Self-pay | Admitting: Internal Medicine

## 2022-04-09 DIAGNOSIS — Z961 Presence of intraocular lens: Secondary | ICD-10-CM | POA: Diagnosis not present

## 2022-04-15 ENCOUNTER — Other Ambulatory Visit: Payer: Self-pay | Admitting: Internal Medicine

## 2022-04-16 NOTE — Telephone Encounter (Signed)
Requested medication (s) are due for refill today: no ? ?Requested medication (s) are on the active medication list: no ? ?Last refill:  02/05/22 ? ?Future visit scheduled: yes ? ?Notes to clinic:  Unable to refill per protocol, Rx expired. Medication was discontinued 03/19/22, change in therapy. ? ? ?  ?Requested Prescriptions  ?Pending Prescriptions Disp Refills  ? metoprolol succinate (TOPROL-XL) 25 MG 24 hr tablet [Pharmacy Med Name: Metoprolol Succinate ER 25 MG Oral Tablet Extended Release 24 Hour] 100 tablet 2  ?  Sig: TAKE 1 TABLET BY MOUTH DAILY  ?  ? Cardiovascular:  Beta Blockers Failed - 04/15/2022  7:00 AM  ?  ?  Failed - Last BP in normal range  ?  BP Readings from Last 1 Encounters:  ?03/12/22 (!) 170/60  ?  ?  ?  ?  Passed - Last Heart Rate in normal range  ?  Pulse Readings from Last 1 Encounters:  ?03/12/22 61  ?  ?  ?  ?  Passed - Valid encounter within last 6 months  ?  Recent Outpatient Visits   ? ?      ? 1 month ago Chronic intractable headache, unspecified headache type  ? [place], [place]  ? 1 month ago DNR (do not resuscitate)  ? [facility] Vigg, Avanti, MD  ? 2 months ago Encounter to establish care  ? [facility] Vigg, Avanti, MD  ? 1 year ago Knee swelling  ? [facility] Glean Hess, MD  ? 1 year ago Essential hypertension  ? [facility] Glean Hess, MD  ? ?  ?  ?Future Appointments   ? ?        ? In 2 months Kathrine Haddock, NP [facility] [place]  ? In 4 months Gollan, Kathlene November, MD [facility], [facility]  ? ?  ? ? ?  ?  ?  ? ? ?

## 2022-04-23 ENCOUNTER — Telehealth: Payer: Self-pay | Admitting: Cardiovascular Disease

## 2022-04-23 MED ORDER — DOXAZOSIN MESYLATE 1 MG PO TABS: 1.0000 mg | ORAL_TABLET | Freq: Two times a day (BID) | ORAL | 0 refills | Status: DC

## 2022-04-23 NOTE — Telephone Encounter (Signed)
Requested Prescriptions  ? ?Signed Prescriptions Disp Refills  ? doxazosin (CARDURA) 1 MG tablet 180 tablet 0  ?  Sig: Take 1 tablet (1 mg total) by mouth 2 (two) times daily.  ?  Authorizing Provider: [name]  ?  Ordering User: Raelene Bott, Gerod Caligiuri L  ? ? ?

## 2022-04-23 NOTE — Telephone Encounter (Signed)
Please advise if patient should be taking doxazosin/OK to refill. Was not on last AVS (March 2023)/med list but looks like it was refilled recently. Thank you! ?

## 2022-04-23 NOTE — Telephone Encounter (Signed)
?*  STAT* If patient is at the pharmacy, call can be transferred to refill team. ? ? ?1. Which medications need to be refilled? (please list name of each medication and dose if known) need a new prescription for Cardura ? ?2. Which pharmacy/location (including street and city if local pharmacy) is medication to be sent to? [facility] ? ?3. Do they need a 30 day or 90 day supply? 90 days and refills ? ?

## 2022-04-29 ENCOUNTER — Inpatient Hospital Stay (HOSPITAL_BASED_OUTPATIENT_CLINIC_OR_DEPARTMENT_OTHER): Payer: Medicare Other | Admitting: Oncology

## 2022-04-29 ENCOUNTER — Inpatient Hospital Stay: Payer: Medicare Other | Attending: Oncology

## 2022-04-29 ENCOUNTER — Encounter: Payer: Self-pay | Admitting: Oncology

## 2022-04-29 ENCOUNTER — Inpatient Hospital Stay: Payer: Medicare Other

## 2022-04-29 VITALS — BP 126/66 | HR 70 | Temp 97.9°F | Resp 16 | Wt 182.6 lb

## 2022-04-29 DIAGNOSIS — C7951 Secondary malignant neoplasm of bone: Secondary | ICD-10-CM

## 2022-04-29 DIAGNOSIS — C50919 Malignant neoplasm of unspecified site of unspecified female breast: Secondary | ICD-10-CM

## 2022-04-29 DIAGNOSIS — Z79811 Long term (current) use of aromatase inhibitors: Secondary | ICD-10-CM | POA: Insufficient documentation

## 2022-04-29 DIAGNOSIS — Z170 Estrogen receptor positive status [ER+]: Secondary | ICD-10-CM

## 2022-04-29 DIAGNOSIS — C50912 Malignant neoplasm of unspecified site of left female breast: Secondary | ICD-10-CM | POA: Diagnosis not present

## 2022-04-29 DIAGNOSIS — C773 Secondary and unspecified malignant neoplasm of axilla and upper limb lymph nodes: Secondary | ICD-10-CM | POA: Insufficient documentation

## 2022-04-29 LAB — COMPREHENSIVE METABOLIC PANEL
ALT: 11 U/L (ref 0–44)
AST: 23 U/L (ref 15–41)
Albumin: 3.9 g/dL (ref 3.5–5.0)
Alkaline Phosphatase: 77 U/L (ref 38–126)
Anion gap: 7 (ref 5–15)
BUN: 23 mg/dL (ref 8–23)
CO2: 29 mmol/L (ref 22–32)
Calcium: 8.9 mg/dL (ref 8.9–10.3)
Chloride: 101 mmol/L (ref 98–111)
Creatinine, Ser: 0.95 mg/dL (ref 0.44–1.00)
GFR, Estimated: 56 mL/min — ABNORMAL LOW (ref >60)
Glucose, Bld: 114 mg/dL — ABNORMAL HIGH (ref 70–99)
Potassium: 3.4 mmol/L — ABNORMAL LOW (ref 3.5–5.1)
Sodium: 137 mmol/L (ref 135–145)
Total Bilirubin: 0.7 mg/dL (ref 0.3–1.2)
Total Protein: 7.4 g/dL (ref 6.5–8.1)

## 2022-04-29 LAB — CBC WITH DIFFERENTIAL/PLATELET
Abs Immature Granulocytes: 0.02 10*3/uL (ref 0.00–0.07)
Basophils Absolute: 0 10*3/uL (ref 0.0–0.1)
Basophils Relative: 1 %
Eosinophils Absolute: 0.2 10*3/uL (ref 0.0–0.5)
Eosinophils Relative: 4 %
HCT: 37.4 % (ref 36.0–46.0)
Hemoglobin: 12.4 g/dL (ref 12.0–15.0)
Immature Granulocytes: 0 %
Lymphocytes Relative: 17 %
Lymphs Abs: 1 10*3/uL (ref 0.7–4.0)
MCH: 29.5 pg (ref 26.0–34.0)
MCHC: 33.2 g/dL (ref 30.0–36.0)
MCV: 89 fL (ref 80.0–100.0)
Monocytes Absolute: 0.4 10*3/uL (ref 0.1–1.0)
Monocytes Relative: 7 %
Neutro Abs: 4.2 10*3/uL (ref 1.7–7.7)
Neutrophils Relative %: 71 %
Platelets: 192 10*3/uL (ref 150–400)
RBC: 4.2 MIL/uL (ref 3.87–5.11)
RDW: 14.5 % (ref 11.5–15.5)
WBC: 5.9 10*3/uL (ref 4.0–10.5)
nRBC: 0 % (ref 0.0–0.2)

## 2022-04-29 NOTE — Progress Notes (Unsigned)
Pt will like to know if the scans showed any recurrence for cancer? Did scans show anything to explain severe lower back pain?

## 2022-04-30 LAB — CANCER ANTIGEN 27.29: CA 27.29: 19 U/mL (ref 0.0–38.6)

## 2022-05-02 ENCOUNTER — Encounter: Payer: Self-pay | Admitting: Hematology and Oncology

## 2022-05-02 NOTE — Progress Notes (Signed)
Hematology/Oncology Consult note [facility]  Telephone:([phone] [phone]  Patient Care Team: Charlynne Cousins, MD as PCP - General (Internal Medicine) [name], MD as Consulting Physician (Cardiology) Renata Caprice as Physician Assistant (Orthopedic Surgery) Bary Castilla Forest Gleason, MD (General Surgery) Hessie Knows, MD as Consulting Physician (Orthopedic Surgery) Sindy Guadeloupe, MD as Consulting Physician (Hematology and Oncology)   Name of the patient: Kelli Williams  [id number]  11-Nov-1926   Date of visit: 05/02/22  Diagnosis- metastatic ER positive breast cancer  Chief complaint/ Reason for visit-routine follow-up of breast cancer  Heme/Onc history: Patient is a 88 year old female who was previously seeing Dr. Mike Gip and is now transferring her care to me.  She was diagnosed with left breast cancer in August 2000 and NTU.  It was a 0.8 cm grade 2 invasive mammary carcinoma ER 50% positive PR 90% positive.  She underwent adjuvant radiation treatment and completed 5 years of tamoxifen in July 2005.  She was noted to have metastatic breast cancer on left axillary biopsy in August 2018.  Pathology showed 1.1 cm grade 2 invasive mammary carcinoma with focal lobular features.  1 out of 4 axillary lymph nodes with extracapsular extension.  Tumor greater than 90% ER/PR positive and HER2 negative by FISH.  PET scan also showed widespread hypermetabolic osseous bone lesions she has been on Faslodex since August 2018 with stable disease and most recent PET scan in December 2021 showed no evidence of recurrence or progression.  Patient was also on Xgeva previously which was given between October 2018 but stopped in May 2019 due to dental issues   Patient was switched from Faslodex to letrozole in July 2022  Interval history-patient is here with her daughter today.  She is doing well for her age and has not had any recent hospitalizations but  does report worsening back pain.  She does have chronic degenerative disease in her spine as well.  Appetite and weight have remained stable  ECOG PS- 2-3 Pain scale- 3 Opioid associated constipation- no  Review of systems- Review of Systems  Constitutional:  Negative for chills, fever, malaise/fatigue and weight loss.  HENT:  Negative for congestion, ear discharge and nosebleeds.   Eyes:  Negative for blurred vision.  Respiratory:  Negative for cough, hemoptysis, sputum production, shortness of breath and wheezing.   Cardiovascular:  Negative for chest pain, palpitations, orthopnea and claudication.  Gastrointestinal:  Negative for abdominal pain, blood in stool, constipation, diarrhea, heartburn, melena, nausea and vomiting.  Genitourinary:  Negative for dysuria, flank pain, frequency, hematuria and urgency.  Musculoskeletal:  Positive for back pain. Negative for joint pain and myalgias.  Skin:  Negative for rash.  Neurological:  Negative for dizziness, tingling, focal weakness, seizures, weakness and headaches.  Endo/Heme/Allergies:  Does not bruise/bleed easily.  Psychiatric/Behavioral:  Negative for depression and suicidal ideas. The patient does not have insomnia.       Allergies  Allergen Reactions   Amitriptyline Hives   Amoxicillin     Diarrhea     Black Pepper [[place]]    Ivp Dye [Iodinated Contrast Media]    Tape Other (See Comments)    Whelps - please use paper tape.   Sulfa Antibiotics Rash     Past Medical History:  Diagnosis Date   Anxiety    Arthritis    BOTH FEET AND KNEES   Breast cancer ([place]) 2000   left breast ca with lumpectomy and rad tx 5 yr  tamoxifen/Dr Jeannine Kitten at [facility]   Complication of anesthesia    WOKE UP DURING SURGERY FOR DEVIATED SEPTUM, KNEE REPLACEMENT AND BREAST LUMPECTOMY   Dysrhythmia    GERD (gastroesophageal reflux disease)    Hypertension    Neuropathic pain of both legs    Neuropathy    Personal history of chemotherapy    Personal  history of radiation therapy    Pneumonia 2016   Skin cancer of forehead    Skin cancer of nose      Past Surgical History:  Procedure Laterality Date   ABDOMINAL HYSTERECTOMY     AXILLARY LYMPH NODE BIOPSY Left 07/10/2017   INVASIVE MAMMARY CARCINOMA WITH FOCAL LOBULAR FEATURES.    BREAST BIOPSY Bilateral 1960   benign   BREAST LUMPECTOMY Left 2000   breast ca with rad tx   BREAST LUMPECTOMY Left 08/20/2017   BREAST LUMPECTOMY Left 08/20/2017   Procedure: left breast wide excision;  Surgeon: Robert Bellow, MD;  Location: [facility] ORS;  Service: General;  Laterality: Left;   CATARACT EXTRACTION     left eye   CHOLECYSTECTOMY     JOINT REPLACEMENT     NASAL SEPTUM SURGERY     PARTIAL KNEE ARTHROPLASTY     right   TOTAL VAGINAL HYSTERECTOMY      Social History   Socioeconomic History   Marital status: Widowed    Spouse name: Not on file   Number of children: 3   Years of education: some college   Highest education level: 12th grade  Occupational History   Occupation: Retired  Tobacco Use   Smoking status: Never   Smokeless tobacco: Never   Tobacco comments:    smoking cessation materials not required  Vaping Use   Vaping Use: Never used  Substance and Sexual Activity   Alcohol use: No    Alcohol/week: 0.0 standard drinks   Drug use: No   Sexual activity: Not Currently  Other Topics Concern   Not on file  Social History Narrative   Patient lives in independent living at [place] Resource Strain: Low Risk    Difficulty of Paying Living Expenses: Not hard at all  Food Insecurity: No Food Insecurity   Worried About Charity fundraiser in the Last Year: Never true   Arboriculturist in the Last Year: Never true  Transportation Needs: No Transportation Needs   Lack of Transportation (Medical): No   Lack of Transportation (Non-Medical): No  Physical Activity: Inactive   Days of Exercise per Week: 0 days    Minutes of Exercise per Session: 0 min  Stress: No Stress Concern Present   Feeling of Stress : Not at all  Social Connections: Socially Isolated   Frequency of Communication with Friends and Family: More than three times a week   Frequency of Social Gatherings with Friends and Family: Three times a week   Attends Religious Services: Never   Active Member of Clubs or Organizations: No   Attends Archivist Meetings: Never   Marital Status: Widowed  Human resources officer Violence: Not At Risk   Fear of Current or Ex-Partner: No   Emotionally Abused: No   Physically Abused: No   Sexually Abused: No    Family History  Problem Relation Age of Onset   Heart disease Mother    Heart attack Paternal Uncle    Breast cancer Daughter 40  genetic negative   Cancer Paternal Grandmother      Current Outpatient Medications:    acetaminophen (TYLENOL) 325 MG tablet, Take 650 mg by mouth every 6 (six) hours as needed., Disp: , Rfl:    aspirin 81 MG tablet, Take 1 tablet by mouth daily., Disp: , Rfl:    Calcium-Magnesium 500-250 MG TABS, Take 1 tablet by mouth daily. , Disp: , Rfl:    doxazosin (CARDURA) 1 MG tablet, Take 1 tablet (1 mg total) by mouth 2 (two) times daily., Disp: 180 tablet, Rfl: 0   ezetimibe (ZETIA) 10 MG tablet, Take 1 tablet (10 mg total) by mouth daily., Disp: 90 tablet, Rfl: 0   gabapentin (NEURONTIN) 300 MG capsule, TAKE 3 CAPSULES BY MOUTH TWICE  DAILY, Disp: 540 capsule, Rfl: 2   letrozole (FEMARA) 2.5 MG tablet, TAKE 1 TABLET BY MOUTH  DAILY, Disp: 90 tablet, Rfl: 3   lisinopril (ZESTRIL) 40 MG tablet, Take 1 tablet (40 mg) by mouth once daily, Disp: , Rfl:    meloxicam (MOBIC) 15 MG tablet, TAKE 1 TABLET BY MOUTH DAILY, Disp: 90 tablet, Rfl: 3   Multiple Vitamins-Minerals (MULTIVITAMIN WITH MINERALS) tablet, Take 1 tablet by mouth daily., Disp: , Rfl:    omeprazole (PRILOSEC) 20 MG capsule, Take 1 capsule (20 mg total) by mouth daily., Disp: 90 capsule, Rfl:  2   potassium chloride (KLOR-CON) 10 MEQ tablet, Take 1 tablet (10 mEq total) by mouth daily., Disp: 90 tablet, Rfl: 3   hydrochlorothiazide (HYDRODIURIL) 25 MG tablet, Take 1 tablet (25 mg total) by mouth daily. (Patient not taking: Reported on 04/29/2022), Disp: 60 tablet, Rfl: 3  Physical exam:  Vitals:   04/29/22 1040  BP: 126/66  Pulse: 70  Resp: 16  Temp: 97.9 F (36.6 C)  SpO2: 93%  Weight: 182 lb 9.6 oz (82.8 kg)   Physical Exam Constitutional:      Comments: Ambulates with a walker and appears in no acute distress  Cardiovascular:     Rate and Rhythm: Normal rate and regular rhythm.     Heart sounds: Normal heart sounds.  Pulmonary:     Effort: Pulmonary effort is normal.     Breath sounds: Normal breath sounds.  Abdominal:     General: Bowel sounds are normal.     Palpations: Abdomen is soft.  Skin:    General: Skin is warm and dry.  Neurological:     Mental Status: She is alert and oriented to person, place, and time.        Latest Ref Rng & Units 04/29/2022   10:15 AM  CMP  Glucose 70 - 99 mg/dL 114    BUN 8 - 23 mg/dL 23    Creatinine 0.44 - 1.00 mg/dL 0.95    Sodium 135 - 145 mmol/L 137    Potassium 3.5 - 5.1 mmol/L 3.4    Chloride 98 - 111 mmol/L 101    CO2 22 - 32 mmol/L 29    Calcium 8.9 - 10.3 mg/dL 8.9    Total Protein 6.5 - 8.1 g/dL 7.4    Total Bilirubin 0.3 - 1.2 mg/dL 0.7    Alkaline Phos 38 - 126 U/L 77    AST 15 - 41 U/L 23    ALT 0 - 44 U/L 11        Latest Ref Rng & Units 04/29/2022   10:15 AM  CBC  WBC 4.0 - 10.5 K/uL 5.9    Hemoglobin 12.0 - 15.0 g/dL  12.4    Hematocrit 36.0 - 46.0 % 37.4    Platelets 150 - 400 K/uL 192       Assessment and plan- Patient is a 88 y.o. female with ER positive breast cancer and bone metastases previously on Faslodex since 2018.  She is currently on letrozole and this is a routine follow-up visit  Patient's most recent CT scans in December 2022 did not show any evidence of progressive disease.She  has had chronic lytic lesions in the thoracic and lumbar spine which have remained stable for the last couple of years.  Patient will continue with letrozole at this time and I will see her back in 3 months with a PET scan and a bone density scan prior.  She will let us know if her back pain is continuing to worsen.  Tumor markers have continued to remain stable   Visit Diagnosis 1. Carcinoma of breast metastatic to bone, unspecified laterality ([place])   2. Use of letrozole (Femara)      Dr. Randa Evens, MD, MPH [facility] at [facility] [phone] 05/02/2022 7:06 PM

## 2022-05-13 ENCOUNTER — Telehealth: Payer: Self-pay | Admitting: *Deleted

## 2022-05-13 NOTE — Telephone Encounter (Signed)
Patient daughter called reporting that patient has broken a tooth and no dentist will take her because she has cancer. She is asking if there are any restrictions for patient or not and any specifics related to it if so. Please return her call

## 2022-05-14 ENCOUNTER — Encounter: Payer: Self-pay | Admitting: Hematology and Oncology

## 2022-05-14 ENCOUNTER — Other Ambulatory Visit: Payer: Self-pay | Admitting: *Deleted

## 2022-05-14 NOTE — Progress Notes (Signed)
I have called the daughter back and told her that [place] the NP suggested to use community care that is a program that it is done through [facility] which is palliative and hospice. Also let her know that if pt does go with palliative care she could keep getting inj. Here and if she was to go into hospice the injections would stop because the hospice does not cover the injection with hospice

## 2022-05-17 ENCOUNTER — Telehealth: Payer: Self-pay | Admitting: Nurse Practitioner

## 2022-05-17 ENCOUNTER — Telehealth: Payer: Self-pay | Admitting: *Deleted

## 2022-05-17 ENCOUNTER — Other Ambulatory Visit: Payer: Self-pay | Admitting: *Deleted

## 2022-05-17 DIAGNOSIS — C50919 Malignant neoplasm of unspecified site of unspecified female breast: Secondary | ICD-10-CM

## 2022-05-17 NOTE — Telephone Encounter (Signed)
Daughter Kelli Williams had called earlier in the week about her mother and wanted to see if there was any palliative care referral that could be done to see if there is anything that they can help the patient so that she can stay in her apartment at the [place].  I spoke to [facility] and he said to do a community care which is her ambulatory referral to palliative care.  The daughter spoke to her mother and she is agreeable to have a referral.  I also called over to palliative care with for care and let Kelli Williams know that the week of 6 have anything going on so that /why they wanted her to be seen that day 26 would be the best week to come and evaluate and usually between 1030 and 11 is the best time for the patient herself.  The daughter Kelli Williams says that if she has more than 1 or 2 appointments a week it just does not work well with her so she tries to make sure everything is not overwhelming for her and so June 26 is the week that she does not have any appointments and that is why the daughter wanted them to be seen 6/26 somewhere between 1030 and 11 if possible.  The knee set up for care is riding this up and we will send it to the nurse practitioner that is going to be going out hopefully on that date of 6/26.  Kelli Williams is aware of all of this and will be receiving a phone call from [place] care.

## 2022-05-17 NOTE — Progress Notes (Signed)
Amb ref to palliative care

## 2022-05-17 NOTE — Telephone Encounter (Signed)
Rec'd return cal from patient's daughter Tiffany Kocher and we discussed the Palliative referral/services and all questions were answered and she was in agreement with beginning services with [place].  I have scheduled an In-home Consult for 06/03/22 @ 2 PM

## 2022-05-29 ENCOUNTER — Telehealth: Payer: Self-pay

## 2022-05-29 DIAGNOSIS — C50919 Malignant neoplasm of unspecified site of unspecified female breast: Secondary | ICD-10-CM | POA: Diagnosis not present

## 2022-05-29 DIAGNOSIS — Z170 Estrogen receptor positive status [ER+]: Secondary | ICD-10-CM | POA: Diagnosis not present

## 2022-05-29 DIAGNOSIS — G301 Alzheimer's disease with late onset: Secondary | ICD-10-CM | POA: Diagnosis not present

## 2022-05-29 DIAGNOSIS — G893 Neoplasm related pain (acute) (chronic): Secondary | ICD-10-CM | POA: Diagnosis not present

## 2022-05-29 DIAGNOSIS — G5793 Unspecified mononeuropathy of bilateral lower limbs: Secondary | ICD-10-CM | POA: Diagnosis not present

## 2022-05-29 DIAGNOSIS — Z7189 Other specified counseling: Secondary | ICD-10-CM | POA: Diagnosis not present

## 2022-05-29 DIAGNOSIS — F02A0 Dementia in other diseases classified elsewhere, mild, without behavioral disturbance, psychotic disturbance, mood disturbance, and anxiety: Secondary | ICD-10-CM | POA: Diagnosis not present

## 2022-05-29 DIAGNOSIS — Z9181 History of falling: Secondary | ICD-10-CM | POA: Diagnosis not present

## 2022-05-29 NOTE — Telephone Encounter (Signed)
Spoke with [place] about Attending physicians statement, informed them that Patient is new we only seen pt 2 - 3 times and are unable to answer all questions on form since are unaware of these problems.

## 2022-05-29 NOTE — Telephone Encounter (Signed)
Copied from [place]. Topic: General - Other >> May 29, 2022  2:05 PM Chapman Fitch wrote: Reason for CRM: requested a Attending physicians statement on May 30.2023/ please sign and complete claim initiation section and fax back to [phone]

## 2022-06-03 ENCOUNTER — Encounter: Payer: Self-pay | Admitting: Hematology and Oncology

## 2022-06-03 ENCOUNTER — Other Ambulatory Visit: Payer: Medicare Other | Admitting: Student

## 2022-06-03 ENCOUNTER — Other Ambulatory Visit: Payer: Self-pay | Admitting: Internal Medicine

## 2022-06-03 DIAGNOSIS — C7951 Secondary malignant neoplasm of bone: Secondary | ICD-10-CM

## 2022-06-03 DIAGNOSIS — Z170 Estrogen receptor positive status [ER+]: Secondary | ICD-10-CM | POA: Diagnosis not present

## 2022-06-03 DIAGNOSIS — C50412 Malignant neoplasm of upper-outer quadrant of left female breast: Secondary | ICD-10-CM | POA: Diagnosis not present

## 2022-06-03 DIAGNOSIS — Z515 Encounter for palliative care: Secondary | ICD-10-CM | POA: Diagnosis not present

## 2022-06-03 DIAGNOSIS — G893 Neoplasm related pain (acute) (chronic): Secondary | ICD-10-CM | POA: Diagnosis not present

## 2022-06-06 ENCOUNTER — Telehealth: Payer: Self-pay | Admitting: Primary Care

## 2022-06-06 DIAGNOSIS — C50412 Malignant neoplasm of upper-outer quadrant of left female breast: Secondary | ICD-10-CM | POA: Diagnosis not present

## 2022-06-06 DIAGNOSIS — G893 Neoplasm related pain (acute) (chronic): Secondary | ICD-10-CM | POA: Diagnosis not present

## 2022-06-06 DIAGNOSIS — Z515 Encounter for palliative care: Secondary | ICD-10-CM | POA: Diagnosis not present

## 2022-06-06 DIAGNOSIS — C7951 Secondary malignant neoplasm of bone: Secondary | ICD-10-CM | POA: Diagnosis not present

## 2022-06-06 DIAGNOSIS — Z170 Estrogen receptor positive status [ER+]: Secondary | ICD-10-CM | POA: Diagnosis not present

## 2022-06-06 NOTE — Telephone Encounter (Signed)
Returned call to POA to clarify some questions she has about hospice admission. States her mother wants to pursue PET scanning, not sure when it is due but thinks August. Currently on letrazole only, no other treatments due to poor tolerance. I clarified hospice eligibility and services. Our Medical director has approved patient for hospice admission from our side, now depending on family choices. Discussed her mother may move to [employer], and that we can start with hospice here and send referral to [facility] provider once needed. Questions answered RE prognosis of 6 months. I also suggested she consult with Cancer care palliative clinic and Josh Borders NP if she has any questions about mother's care plan. POA will call back and speak with L Rivers NP, if needed, and I will ask NP Rivers to see patient asap, per daughter's request.

## 2022-06-06 NOTE — Telephone Encounter (Signed)
Call from pharmacy to only fill new opioid x 1 week.  I am sending in balance that NP Rivers sent,which is oxycodone 5 mg po , 1/2 tablet tid prn pain. # 33 tabs.

## 2022-06-13 ENCOUNTER — Other Ambulatory Visit: Payer: Medicare Other | Admitting: Student

## 2022-06-13 DIAGNOSIS — R52 Pain, unspecified: Secondary | ICD-10-CM | POA: Diagnosis not present

## 2022-06-13 DIAGNOSIS — C50412 Malignant neoplasm of upper-outer quadrant of left female breast: Secondary | ICD-10-CM

## 2022-06-13 DIAGNOSIS — Z170 Estrogen receptor positive status [ER+]: Secondary | ICD-10-CM | POA: Diagnosis not present

## 2022-06-13 DIAGNOSIS — Z515 Encounter for palliative care: Secondary | ICD-10-CM | POA: Diagnosis not present

## 2022-06-13 NOTE — Progress Notes (Signed)
Therapist, nutritional Palliative Care Consult Note Telephone: [phone]  Fax: [phone]    Date of encounter: 06/13/22 3:19 PM PATIENT NAME: Kelli Williams [address] [place] [address]   [id number] (home)  DOB: 26-Apr-1926 MRN: [id number] PRIMARY CARE PROVIDER:    Loura Pardon, MD,  [address] [place] [address] [phone]  REFERRING PROVIDER:   Loura Pardon, MD [address] [place],  [place] [address] [phone]  RESPONSIBLE PARTY:    Contact Information     Name Relation Home Work Kelli Williams Daughter   [phone]   Kelli Williams Daughter   [phone]   Kelli Williams Daughter [phone]  [phone]        I met face to face with patient and family in the home.Palliative Care was asked to follow this patient by consultation request of  Kelli Williams, [place], MD to address advance care planning and complex medical decision making. This is a follow up visit.                                   ASSESSMENT AND PLAN / RECOMMENDATIONS:   Advance Care Planning/Goals of Care: Goals include to maximize quality of life and symptom management. Patient/health care surrogate gave his/her permission to discuss. Our advance care planning conversation included a discussion about:    The value and importance of advance care planning  Experiences with loved ones who have been seriously ill or have died  Exploration of personal, cultural or spiritual beliefs that might influence medical decisions  Exploration of goals of care in the event of a sudden injury or illness  CODE STATUS: DNR  Education provided on palliative medicine vs. Hospice. Patient currently living independently but is requiring additional assistance. Patient would like to remain in her home.  Family is in process of attempting to place private caregivers.  Daughter Kelli Williams is also present during visit.  Answered any questions  patient and daughter may have about hospice services.  Palliative medicine will continue to provide supportive care, symptom management. Discussed long range planning. Family is to check with facility what requirements should be in place for patient to remain independent living apartment.   Symptom Management/Plan:  Breast cancer with metastasis to bone-patient currently receiving letrozole since July 2022. She has upcoming appointment with Dr. Smith Williams in August.  Patient has expressed not wanting any aggressive treatments, but she does want to have PET scan in August.  We will continue to monitor for worsening symptoms; provide symptom management as needed.  Pain-patient has started taking acetaminophen 650 mg routinely, continues on meloxicam daily, gabapentin 300 mg 3 times daily.  Ordered oxycodone 2.5 mg, although patient has not started medication.  We discussed starting medication when family is present to monitor for any adverse effects.   Follow up Palliative Care Visit: Palliative care will continue to follow for complex medical decision making, advance care planning, and clarification of goals. Return in 4 weeks or prn.  I spent 60 minutes providing this consultation. More than 50% of the time in this consultation was spent in counseling and care coordination.  PPS: 50%  HOSPICE ELIGIBILITY/DIAGNOSIS: TBD  Chief Complaint: Palliative medicine follow-up visit.  HISTORY OF PRESENT ILLNESS:  Kelli Williams is a 88 y.o. year old female  with metastatic ER positive breast cancer, neuropathy, hx of skin cancer, hx of lumpectomy 2000. No longer receiving injections, currently taking  letrozole daily.    Patient resides at [employer] [place] in [place] apartment. Daughter is present at visit today. She has started acetaminophen routinely for pain; she reports some improvement. She has not started the oxycodone. Denies shortness of breath, nausea, constipation. She uses walker for ambulation.  She endorses fair appetite; weight has been stable. A 10-point ROS is negative, except for the pertinent positives and negatives detailed per the HPI.   History obtained from review of EMR, discussion with primary team, and interview with family, facility staff/caregiver and/or Kelli Williams.  I reviewed available labs, medications, imaging, studies and related documents from the EMR.  Records reviewed and summarized above.    Physical Exam: Constitutional: NAD General: frail appearing, thin EYES: anicteric sclera, lids intact, no discharge  ENMT: intact hearing, oral mucous membranes moist, dentition intact CV: S1S2, RRR, 2+LE edema Pulmonary: no increased work of breathing, no cough, room air Abdomen: normo-active BS + 4 quadrants, soft and non tender, no ascites GU: deferred MSK: no sarcopenia, moves all extremities, ambulatory Skin: warm and dry, no rashes or wounds on visible skin Neuro: +generalized weakness, A & O x 3 Psych: non-anxious affect, pleasant Hem/lymph/immuno: no widespread bruising   Thank you for the opportunity to participate in the care of Kelli Williams.  The palliative care team will continue to follow. Please call our office at [phone] if we can be of additional assistance.   Kelli Slocumb, NP   COVID-19 PATIENT SCREENING TOOL Asked and negative response unless otherwise noted:   Have you had symptoms of covid, tested positive or been in contact with someone with symptoms/positive test in the past 5-10 days?  No

## 2022-06-20 ENCOUNTER — Encounter: Payer: Self-pay | Admitting: Podiatry

## 2022-06-20 ENCOUNTER — Ambulatory Visit: Payer: Medicare Other | Admitting: Podiatry

## 2022-06-20 DIAGNOSIS — M79674 Pain in right toe(s): Secondary | ICD-10-CM | POA: Diagnosis not present

## 2022-06-20 DIAGNOSIS — M79675 Pain in left toe(s): Secondary | ICD-10-CM

## 2022-06-20 DIAGNOSIS — B351 Tinea unguium: Secondary | ICD-10-CM | POA: Diagnosis not present

## 2022-06-20 DIAGNOSIS — I739 Peripheral vascular disease, unspecified: Secondary | ICD-10-CM

## 2022-06-20 NOTE — Progress Notes (Signed)
This patient returns to my office for at risk foot care.  This patient requires this care by a professional since this patient will be at risk due to having  PAD.  This patient is unable to cut nails herself since the patient cannot reach hernails.These nails are painful walking and wearing shoes.  This patient presents for at risk foot care today.  General Appearance  Alert, conversant and in no acute stress.  Vascular  Dorsalis pedis and posterior tibial  pulses are  weakly palpable  bilaterally.  Capillary return is within normal limits  bilaterally. Temperature is within normal limits  bilaterally.  Neurologic  Senn-Weinstein monofilament wire test diminished/ absent  bilaterally. Muscle power within normal limits bilaterally.  Nails Thick disfigured discolored nails with subungual debris  from hallux to fifth toes bilaterally. No evidence of bacterial infection or drainage bilaterally.  Orthopedic  No limitations of motion  feet .  No crepitus or effusions noted.  No bony pathology or digital deformities noted. Hammer toes 2-5  B/l.  Skin  normotropic skin with no porokeratosis noted bilaterally.  No signs of infections or ulcers noted.     Onychomycosis  Pain in right toes  Pain in left toes  Consent was obtained for treatment procedures.   Mechanical debridement of nails 1-5  bilaterally performed with a nail nipper.  Filed with dremel without incident.    Return office visit   3 months                  Told patient to return for periodic foot care and evaluation due to potential at risk complications.   Gardiner Barefoot DPM

## 2022-06-24 ENCOUNTER — Other Ambulatory Visit: Payer: Self-pay | Admitting: Cardiovascular Disease

## 2022-06-25 ENCOUNTER — Encounter: Payer: Self-pay | Admitting: Unknown Physician Specialty

## 2022-06-25 ENCOUNTER — Ambulatory Visit (INDEPENDENT_AMBULATORY_CARE_PROVIDER_SITE_OTHER): Payer: Medicare Other | Admitting: Unknown Physician Specialty

## 2022-06-25 VITALS — BP 173/71 | HR 52 | Temp 98.5°F | Wt 183.0 lb

## 2022-06-25 DIAGNOSIS — Z7189 Other specified counseling: Secondary | ICD-10-CM | POA: Diagnosis not present

## 2022-06-25 DIAGNOSIS — Z789 Other specified health status: Secondary | ICD-10-CM

## 2022-06-25 DIAGNOSIS — I10 Essential (primary) hypertension: Secondary | ICD-10-CM

## 2022-06-25 DIAGNOSIS — M545 Low back pain, unspecified: Secondary | ICD-10-CM | POA: Diagnosis not present

## 2022-06-25 DIAGNOSIS — Z7409 Other reduced mobility: Secondary | ICD-10-CM | POA: Diagnosis not present

## 2022-06-25 DIAGNOSIS — R0981 Nasal congestion: Secondary | ICD-10-CM | POA: Diagnosis not present

## 2022-06-25 DIAGNOSIS — Z79899 Other long term (current) drug therapy: Secondary | ICD-10-CM | POA: Diagnosis not present

## 2022-06-25 NOTE — Patient Instructions (Addendum)
X-Lear for sinuses  Flonase  Stop mg and Ca  Cut back on Meloxicam as much as possible.  Tylenol OK

## 2022-06-25 NOTE — Progress Notes (Signed)
BP (!) 173/71   Pulse (!) 52   Temp 98.5 F (36.9 C) (Oral)   Wt 183 lb (83 kg)   SpO2 98%   BMI 32.43 kg/m    Subjective:    Patient ID: Kelli Williams, female    DOB: Mar 14, 1926, 88 y.o.   MRN: [id number]  HPI: Kelli Williams is a 88 y.o. female  Chief Complaint  Patient presents with   Cancer   Hypertension   Pain   Hypertension Using medications without difficulty Average home BPs: not checking  No problems or lightheadedness No chest pain with exertion or shortness of breath No Edema  Relevant past medical, surgical, family and social history reviewed and updated as indicated. Interim medical history since our last visit reviewed. Allergies and medications reviewed and updated.  Review of Systems  HENT:         Sinuses are bothering since a sinus infection.  Feeling tight and congested without drainage.      Per HPI unless specifically indicated above     Objective:    BP (!) 173/71   Pulse (!) 52   Temp 98.5 F (36.9 C) (Oral)   Wt 183 lb (83 kg)   SpO2 98%   BMI 32.43 kg/m   Wt Readings from Last 3 Encounters:  06/25/22 183 lb (83 kg)  04/29/22 182 lb 9.6 oz (82.8 kg)  03/12/22 186 lb 3.2 oz (84.5 kg)    Physical Exam Constitutional:      Comments: Significant kyphosis  HENT:     Head: Normocephalic.  Cardiovascular:     Rate and Rhythm: Normal rate and regular rhythm.  Pulmonary:     Effort: Pulmonary effort is normal.     Breath sounds: Normal breath sounds.  Skin:    General: Skin is warm and dry.  Neurological:     Mental Status: She is alert.  Psychiatric:        Mood and Affect: Mood normal.        Behavior: Behavior normal.     Results for orders placed or performed in visit on 04/29/22  Cancer antigen 27.29  Result Value Ref Range   CA 27.29 19.0 0.0 - 38.6 U/mL  CBC with Differential/Platelet  Result Value Ref Range   WBC 5.9 4.0 - 10.5 K/uL   RBC 4.20 3.87 - 5.11 MIL/uL   Hemoglobin 12.4 12.0 - 15.0 g/dL   HCT  37.4 36.0 - 46.0 %   MCV 89.0 80.0 - 100.0 fL   MCH 29.5 26.0 - 34.0 pg   MCHC 33.2 30.0 - 36.0 g/dL   RDW 14.5 11.5 - 15.5 %   Platelets 192 150 - 400 K/uL   nRBC 0.0 0.0 - 0.2 %   Neutrophils Relative % 71 %   Neutro Abs 4.2 1.7 - 7.7 K/uL   Lymphocytes Relative 17 %   Lymphs Abs 1.0 0.7 - 4.0 K/uL   Monocytes Relative 7 %   Monocytes Absolute 0.4 0.1 - 1.0 K/uL   Eosinophils Relative 4 %   Eosinophils Absolute 0.2 0.0 - 0.5 K/uL   Basophils Relative 1 %   Basophils Absolute 0.0 0.0 - 0.1 K/uL   Immature Granulocytes 0 %   Abs Immature Granulocytes 0.02 0.00 - 0.07 K/uL  Comprehensive metabolic panel  Result Value Ref Range   Sodium 137 135 - 145 mmol/L   Potassium 3.4 (L) 3.5 - 5.1 mmol/L   Chloride 101 98 - 111 mmol/L  CO2 29 22 - 32 mmol/L   Glucose, Bld 114 (H) 70 - 99 mg/dL   BUN 23 8 - 23 mg/dL   Creatinine, Ser 0.95 0.44 - 1.00 mg/dL   Calcium 8.9 8.9 - 10.3 mg/dL   Total Protein 7.4 6.5 - 8.1 g/dL   Albumin 3.9 3.5 - 5.0 g/dL   AST 23 15 - 41 U/L   ALT 11 0 - 44 U/L   Alkaline Phosphatase 77 38 - 126 U/L   Total Bilirubin 0.7 0.3 - 1.2 mg/dL   GFR, Estimated 56 (L) >60 mL/min   Anion gap 7 5 - 15      Assessment & Plan:   Problem List Items Addressed This Visit       Unprioritized   Essential hypertension    144/64 on recheck. Encouraged to monitor at home.  Will continue present medication      Goals of care, counseling/discussion    Feels not ready for hospice at this time.  Continue to discuss      Impaired mobility and ADLs    Uses a walker.  Unable to maneuver in the house.  Needs assistance with bathing, she can bathe by herself but would like someone else there.  She doesn't currently cook.  Unable to stand longer than a few minutes.        Lumbar pain    Pain lower back radiating to pelvis.  Seeing palliative care and needs more home health services.  Palliative recommended hospice but she is not ready at this time.        Other Visit  Diagnoses     Sinus congestion    -  Primary   Try X-lear and Flonase        Follow up plan: Return in about 6 months (around 12/26/2022).

## 2022-06-25 NOTE — Assessment & Plan Note (Signed)
144/64 on recheck. Encouraged to monitor at home.  Will continue present medication

## 2022-06-25 NOTE — Assessment & Plan Note (Signed)
Feels not ready for hospice at this time.  Continue to discuss

## 2022-06-25 NOTE — Assessment & Plan Note (Signed)
Pain lower back radiating to pelvis.  Seeing palliative care and needs more home health services.  Palliative recommended hospice but she is not ready at this time.

## 2022-06-25 NOTE — Assessment & Plan Note (Signed)
Uses a walker.  Unable to maneuver in the house.  Needs assistance with bathing, she can bathe by herself but would like someone else there.  She doesn't currently cook.  Unable to stand longer than a few minutes.

## 2022-06-25 NOTE — Assessment & Plan Note (Signed)
Discontinue Ca and Mg.  DC Meloxicam if possible, may be be able to come off BP medications.

## 2022-06-27 ENCOUNTER — Telehealth: Payer: Medicare Other | Admitting: Physician Assistant

## 2022-06-27 DIAGNOSIS — B9689 Other specified bacterial agents as the cause of diseases classified elsewhere: Secondary | ICD-10-CM

## 2022-06-27 DIAGNOSIS — J019 Acute sinusitis, unspecified: Secondary | ICD-10-CM

## 2022-06-27 MED ORDER — DOXYCYCLINE HYCLATE 100 MG PO TABS: 100.0000 mg | ORAL_TABLET | Freq: Two times a day (BID) | ORAL | 0 refills | Status: DC

## 2022-06-27 NOTE — Progress Notes (Signed)

## 2022-07-02 ENCOUNTER — Other Ambulatory Visit: Payer: Medicare Other | Admitting: Student

## 2022-07-17 ENCOUNTER — Other Ambulatory Visit: Payer: Medicare Other | Admitting: Student

## 2022-07-17 DIAGNOSIS — C50412 Malignant neoplasm of upper-outer quadrant of left female breast: Secondary | ICD-10-CM

## 2022-07-17 DIAGNOSIS — Z515 Encounter for palliative care: Secondary | ICD-10-CM | POA: Diagnosis not present

## 2022-07-17 DIAGNOSIS — G893 Neoplasm related pain (acute) (chronic): Secondary | ICD-10-CM

## 2022-07-17 DIAGNOSIS — Z170 Estrogen receptor positive status [ER+]: Secondary | ICD-10-CM | POA: Diagnosis not present

## 2022-07-17 NOTE — Progress Notes (Signed)
  [facility] Consult Note Telephone: [phone]  Fax: [phone]    Date of encounter: 07/17/22 2:12 PM PATIENT NAME: Kelli Williams [address] [place] [address]   [phone] (home)  DOB: 11/03/1926 MRN: [id number] PRIMARY CARE PROVIDER:    [facility],  [address] [place] [address] [phone]  REFERRING PROVIDER:   Vigg, Avanti, MD [address] [place],  [place] [address] [phone]  RESPONSIBLE PARTY:    Contact Information     Name Relation Home Work Mobile   Hauge-STEVENS,Maribeth Daughter   [phone]   McDaniel,Dorothy Daughter   [phone]   Sevillano,Susan Daughter [phone]  [phone]        I met face to face with patient and family in the home. Palliative Care was asked to follow this patient by consultation request of  Vigg, Avanti, MD to address advance care planning and complex medical decision making. This is a follow up visit.                                   ASSESSMENT AND PLAN / RECOMMENDATIONS:   Advance Care Planning/Goals of Care: Goals include to maximize quality of life and symptom management. Patient/health care surrogate gave his/her permission to discuss. CODE STATUS: DNR  Education provided on Palliative Medicine vs. Hospice services. Patient lives independently. She is currently looking for additional caregiver support in the home. She is eligible for hospice, but declines at this time.   Symptom Management/Plan:  Breast cancer with metastasis to bone-patient currently receiving letrozole since July 2022. She has upcoming PET scan and appointment with Dr. Rao. Patient has expressed not wanting any aggressive treatments. We will continue to monitor for worsening symptoms; provide symptom management as needed.  Pain-currently using acetaminophen, meloxicam routinely. She declines to use oxycodone at this time d/t fear of addiction. Ongoing  education provided on opioids.   Follow up Palliative Care Visit: Palliative care will continue to follow for complex medical decision making, advance care planning, and clarification of goals. Return in 8 weeks or prn.   This visit was coded based on medical decision making (MDM).  PPS: 50%  HOSPICE ELIGIBILITY/DIAGNOSIS: TBD  Chief Complaint: Palliative Medicine follow up visit.   HISTORY OF PRESENT ILLNESS:  Kelli Williams is a 88 y.o. year old female  with metastatic ER positive breast cancer, neuropathy, hx of skin cancer, hx of lumpectomy 2000. No longer receiving injections, currently taking letrozole daily.  Patient resides at [facility] in [place] apartment. Endorses back pain, taking acetaminophen and meloxicam.. Has not taken any oxycodone. Using walker for ambulation; stooped posture. Endorses a good appetite. She sleeps in late, takes a while to start her day. She is currently seeking  private caregivers in the home; community allows select agencies on campus. She does have housekeeping coming in. A 10-point ROS is negative, except for the pertinent positives and negatives detailed per the HPI.   History obtained from review of EMR, discussion with primary team, and interview with family, facility staff/caregiver and/or [name].  I reviewed available labs, medications, imaging, studies and related documents from the EMR.  Records reviewed and summarized above.    Physical Exam: Pulse 76, resp 16, b/p 142/78, sats 97% on room air Constitutional: NAD General: frail appearing EYES: anicteric sclera, lids intact, no discharge  ENMT: intact hearing, oral mucous membranes moist,   dentition intact CV: S1S2, RRR, 2+ LE edema Pulmonary: LCTA, no increased work of breathing, no cough, room air Abdomen: normo-active BS + 4 quadrants, soft and non tender, no ascites GU: deferred MSK: moves all extremities, ambulatory Skin: warm and dry, no rashes or wounds on visible  skin Neuro: + generalized weakness,  A & O x 3 Psych: non-anxious affect, pleasant Hem/lymph/immuno: no widespread bruising   Thank you for the opportunity to participate in the care of [name].  The palliative care team will continue to follow. Please call our office at [phone] if we can be of additional assistance.    S , NP   COVID-19 PATIENT SCREENING TOOL Asked and negative response unless otherwise noted:   Have you had symptoms of covid, tested positive or been in contact with someone with symptoms/positive test in the past 5-10 days? No  

## 2022-07-24 ENCOUNTER — Ambulatory Visit
Admission: RE | Admit: 2022-07-24 | Discharge: 2022-07-24 | Disposition: A | Discharge status: Home / Self Care | Payer: Medicare Other | Source: Ambulatory Visit | Attending: Oncology | Admitting: Oncology

## 2022-07-24 DIAGNOSIS — I700 Atherosclerosis of aorta: Secondary | ICD-10-CM | POA: Insufficient documentation

## 2022-07-24 DIAGNOSIS — I2510 Atherosclerotic heart disease of native coronary artery without angina pectoris: Secondary | ICD-10-CM | POA: Insufficient documentation

## 2022-07-24 DIAGNOSIS — C50919 Malignant neoplasm of unspecified site of unspecified female breast: Secondary | ICD-10-CM | POA: Diagnosis not present

## 2022-07-24 DIAGNOSIS — C7951 Secondary malignant neoplasm of bone: Secondary | ICD-10-CM | POA: Diagnosis not present

## 2022-07-24 DIAGNOSIS — K449 Diaphragmatic hernia without obstruction or gangrene: Secondary | ICD-10-CM | POA: Insufficient documentation

## 2022-07-24 LAB — GLUCOSE, CAPILLARY: Glucose-Capillary: 109 mg/dL — ABNORMAL HIGH (ref 70–99)

## 2022-07-24 MED ORDER — FLUDEOXYGLUCOSE F - 18 (FDG) INJECTION
9.5000 | Freq: Once | INTRAVENOUS | Status: AC | PRN
Start: 2022-07-24 — End: 2022-07-24
  Administered 2022-07-24: 10.3 via INTRAVENOUS

## 2022-07-29 ENCOUNTER — Inpatient Hospital Stay: Payer: Medicare Other | Admitting: Oncology

## 2022-07-29 ENCOUNTER — Telehealth: Payer: Self-pay | Admitting: *Deleted

## 2022-07-29 NOTE — Telephone Encounter (Signed)
Patient called and reported that she is not sure she can make her appointment today at 130 due to having abdominal cramps and diarrhea, She states she took some imodium but not sure if it will work in time or not. She is not sure if she should try to come to appointment or reschedule. She is asking that [facility] call her regarding appointment.

## 2022-07-30 ENCOUNTER — Other Ambulatory Visit: Payer: Medicare Other

## 2022-07-30 DIAGNOSIS — H26492 Other secondary cataract, left eye: Secondary | ICD-10-CM | POA: Diagnosis not present

## 2022-08-09 ENCOUNTER — Encounter: Payer: Self-pay | Admitting: Oncology

## 2022-08-09 ENCOUNTER — Inpatient Hospital Stay: Payer: Medicare Other | Attending: Oncology | Admitting: Oncology

## 2022-08-09 VITALS — BP 153/62 | HR 52 | Temp 98.0°F | Resp 16 | Ht 62.0 in | Wt 186.4 lb

## 2022-08-09 DIAGNOSIS — C50911 Malignant neoplasm of unspecified site of right female breast: Secondary | ICD-10-CM | POA: Diagnosis not present

## 2022-08-09 DIAGNOSIS — Z170 Estrogen receptor positive status [ER+]: Secondary | ICD-10-CM | POA: Diagnosis not present

## 2022-08-09 DIAGNOSIS — C50919 Malignant neoplasm of unspecified site of unspecified female breast: Secondary | ICD-10-CM | POA: Diagnosis not present

## 2022-08-09 DIAGNOSIS — C7951 Secondary malignant neoplasm of bone: Secondary | ICD-10-CM | POA: Insufficient documentation

## 2022-08-09 DIAGNOSIS — Z79811 Long term (current) use of aromatase inhibitors: Secondary | ICD-10-CM | POA: Diagnosis not present

## 2022-08-09 DIAGNOSIS — M25562 Pain in left knee: Secondary | ICD-10-CM

## 2022-08-09 DIAGNOSIS — C773 Secondary and unspecified malignant neoplasm of axilla and upper limb lymph nodes: Secondary | ICD-10-CM | POA: Insufficient documentation

## 2022-08-09 DIAGNOSIS — M25552 Pain in left hip: Secondary | ICD-10-CM

## 2022-08-09 NOTE — Progress Notes (Signed)
Pt eating and drinking good. Sleeps good. She sometimes has constipation and some times diarrhea based on what she eats. Most of time normal. No breast concerns

## 2022-08-09 NOTE — Progress Notes (Signed)
Hematology/Oncology Consult note [facility]  [phone] [phone]  Patient Care Team: [facility] as PCP - General Mariah Milling, Tollie Pizza, MD as Consulting Physician (Cardiology) Ronnette Juniper as Physician Assistant (Orthopedic Surgery) Lemar Livings Merrily Pew, MD (General Surgery) Kennedy Bucker, MD as Consulting Physician (Orthopedic Surgery) Creig Hines, MD as Consulting Physician (Hematology and Oncology)   Name of the patient: Kelli Williams  [id number]  1926/10/05   Date of visit: 08/09/22  Diagnosis- metastatic ER positive breast cancer  Chief complaint/ Reason for visit-routine follow-up of breast cancer  Heme/Onc history: Patient is a 88 year old female who was previously seeing Dr. Merlene Pulling and is now transferring her care to me.  She was diagnosed with left breast cancer in August 2000 and NTU.  It was a 0.8 cm grade 2 invasive mammary carcinoma ER 50% positive PR 90% positive.  She underwent adjuvant radiation treatment and completed 5 years of tamoxifen in July 2005.  She was noted to have metastatic breast cancer on left axillary biopsy in August 2018.  Pathology showed 1.1 cm grade 2 invasive mammary carcinoma with focal lobular features.  1 out of 4 axillary lymph nodes with extracapsular extension.  Tumor greater than 90% ER/PR positive and HER2 negative by FISH.  PET scan also showed widespread hypermetabolic osseous bone lesions she has been on Faslodex since August 2018 with stable disease and most recent PET scan in December 2021 showed no evidence of recurrence or progression.  Patient was also on Xgeva previously which was given between October 2018 but stopped in May 2019 due to dental issues   Patient was switched from Faslodex to letrozole in July 2022    Interval history-patient is doing well overall but does complain of chronic left hip pain and left knee pain.  It affects her balance and causes  strain on her ankle.  She has not had any recent falls  ECOG PS- 2 Pain scale- 3  Review of systems- Review of Systems  Constitutional:  Positive for malaise/fatigue. Negative for chills, fever and weight loss.  HENT:  Negative for congestion, ear discharge and nosebleeds.   Eyes:  Negative for blurred vision.  Respiratory:  Negative for cough, hemoptysis, sputum production, shortness of breath and wheezing.   Cardiovascular:  Negative for chest pain, palpitations, orthopnea and claudication.  Gastrointestinal:  Negative for abdominal pain, blood in stool, constipation, diarrhea, heartburn, melena, nausea and vomiting.  Genitourinary:  Negative for dysuria, flank pain, frequency, hematuria and urgency.  Musculoskeletal:  Positive for joint pain. Negative for back pain and myalgias.  Skin:  Negative for rash.  Neurological:  Negative for dizziness, tingling, focal weakness, seizures, weakness and headaches.  Endo/Heme/Allergies:  Does not bruise/bleed easily.  Psychiatric/Behavioral:  Negative for depression and suicidal ideas. The patient does not have insomnia.       Allergies  Allergen Reactions   Amitriptyline Hives   Amoxicillin     Diarrhea     Black Pepper [Piper]    Ivp Dye [Iodinated Contrast Media]    Tape Other (See Comments)    Whelps - please use paper tape.   Sulfa Antibiotics Rash     Past Medical History:  Diagnosis Date   Anxiety    Arthritis    BOTH FEET AND KNEES   Breast cancer (HCC) 2000   left breast ca with lumpectomy and rad tx 5 yr tamoxifen/[facility] Con[facility]   Complication of anesthesia    WOKE  UP DURING SURGERY FOR DEVIATED SEPTUM, KNEE REPLACEMENT AND BREAST LUMPECTOMY   Dysrhythmia    GERD (gastroesophageal reflux disease)    Hypertension    Neuropathic pain of both legs    Neuropathy    Personal history of chemotherapy    Personal history of radiation therapy    Pneumonia 2016   Skin cancer of forehead    Skin cancer of nose       Past Surgical History:  Procedure Laterality Date   ABDOMINAL HYSTERECTOMY     AXILLARY LYMPH NODE BIOPSY Left 07/10/2017   INVASIVE MAMMARY CARCINOMA WITH FOCAL LOBULAR FEATURES.    BREAST BIOPSY Bilateral 1960   benign   BREAST LUMPECTOMY Left 2000   breast ca with rad tx   BREAST LUMPECTOMY Left 08/20/2017   BREAST LUMPECTOMY Left 08/20/2017   Procedure: left breast wide excision;  Surgeon: Robert Bellow, MD;  Location: [facility] ORS;  Service: General;  Laterality: Left;   CATARACT EXTRACTION     left eye   CHOLECYSTECTOMY     JOINT REPLACEMENT     NASAL SEPTUM SURGERY     PARTIAL KNEE ARTHROPLASTY     right   TOTAL VAGINAL HYSTERECTOMY      Social History   Socioeconomic History   Marital status: Widowed    Spouse name: Not on file   Number of children: 3   Years of education: some college   Highest education level: 12th grade  Occupational History   Occupation: Retired  Tobacco Use   Smoking status: Never   Smokeless tobacco: Never   Tobacco comments:    smoking cessation materials not required  Vaping Use   Vaping Use: Never used  Substance and Sexual Activity   Alcohol use: No    Alcohol/week: 0.0 standard drinks of alcohol   Drug use: No   Sexual activity: Not Currently  Other Topics Concern   Not on file  Social History Narrative   Patient lives in independent living at [facility] Strain: Low Risk  (10/22/2021)   Overall Financial Resource Strain (CARDIA)    Difficulty of Paying Living Expenses: Not hard at all  Food Insecurity: No Food Insecurity (10/22/2021)   Hunger Vital Sign    Worried About Running Out of Food in the Last Year: Never true    Ran Out of Food in the Last Year: Never true  Transportation Needs: No Transportation Needs (10/22/2021)   PRAPARE - Hydrologist (Medical): No    Lack of Transportation (Non-Medical): No  Physical Activity:  Inactive (10/22/2021)   Exercise Vital Sign    Days of Exercise per Week: 0 days    Minutes of Exercise per Session: 0 min  Stress: No Stress Concern Present (10/22/2021)   [place]    Feeling of Stress : Not at all  Social Connections: Socially Isolated (10/22/2021)   Social Connection and Isolation Panel [NHANES]    Frequency of Communication with Friends and Family: More than three times a week    Frequency of Social Gatherings with Friends and Family: Three times a week    Attends Religious Services: Never    Active Member of Clubs or Organizations: No    Attends Archivist Meetings: Never    Marital Status: Widowed  Intimate Partner Violence: Not At Risk (10/22/2021)   Humiliation, Afraid, Rape, and  Kick questionnaire    Fear of Current or Ex-Partner: No    Emotionally Abused: No    Physically Abused: No    Sexually Abused: No    Family History  Problem Relation Age of Onset   Heart disease Mother    Heart attack Paternal Uncle    Breast cancer Daughter 76       genetic negative   Cancer Paternal Grandmother      Current Outpatient Medications:    acetaminophen (TYLENOL) 325 MG tablet, Take 650 mg by mouth every 6 (six) hours as needed., Disp: , Rfl:    aspirin 81 MG tablet, Take 1 tablet by mouth daily., Disp: , Rfl:    doxazosin (CARDURA) 1 MG tablet, TAKE 1 TABLET BY MOUTH TWICE  DAILY, Disp: 180 tablet, Rfl: 0   ezetimibe (ZETIA) 10 MG tablet, TAKE 1 TABLET BY MOUTH DAILY, Disp: 90 tablet, Rfl: 0   gabapentin (NEURONTIN) 300 MG capsule, TAKE 3 CAPSULES BY MOUTH TWICE  DAILY, Disp: 540 capsule, Rfl: 2   letrozole (FEMARA) 2.5 MG tablet, TAKE 1 TABLET BY MOUTH  DAILY, Disp: 90 tablet, Rfl: 3   lisinopril (ZESTRIL) 40 MG tablet, Take 1 tablet (40 mg) by mouth once daily, Disp: , Rfl:    meloxicam (MOBIC) 15 MG tablet, TAKE 1 TABLET BY MOUTH DAILY, Disp: 90 tablet, Rfl: 3   Multiple  Vitamins-Minerals (MULTIVITAMIN WITH MINERALS) tablet, Take 1 tablet by mouth daily., Disp: , Rfl:    omeprazole (PRILOSEC) 20 MG capsule, Take 1 capsule (20 mg total) by mouth daily., Disp: 90 capsule, Rfl: 2   potassium chloride (KLOR-CON) 10 MEQ tablet, Take 1 tablet (10 mEq total) by mouth daily., Disp: 90 tablet, Rfl: 3  Physical exam:  Vitals:   08/09/22 1355  BP: (!) 153/62  Pulse: (!) 52  Resp: 16  Temp: 98 F (36.7 C)  TempSrc: Oral  Weight: 186 lb 6.4 oz (84.6 kg)  Height: $Remove'5\' 2"'qBjpIhv$  (1.575 m)   Physical Exam Constitutional:      General: She is not in acute distress.    Comments: Ambulates with a walker  Cardiovascular:     Rate and Rhythm: Normal rate and regular rhythm.     Heart sounds: Normal heart sounds.  Pulmonary:     Effort: Pulmonary effort is normal.     Breath sounds: Normal breath sounds.  Skin:    General: Skin is warm and dry.  Neurological:     Mental Status: She is alert and oriented to person, place, and time.         Latest Ref Rng & Units 04/29/2022   10:15 AM  CMP  Glucose 70 - 99 mg/dL 114   BUN 8 - 23 mg/dL 23   Creatinine 0.44 - 1.00 mg/dL 0.95   Sodium 135 - 145 mmol/L 137   Potassium 3.5 - 5.1 mmol/L 3.4   Chloride 98 - 111 mmol/L 101   CO2 22 - 32 mmol/L 29   Calcium 8.9 - 10.3 mg/dL 8.9   Total Protein 6.5 - 8.1 g/dL 7.4   Total Bilirubin 0.3 - 1.2 mg/dL 0.7   Alkaline Phos 38 - 126 U/L 77   AST 15 - 41 U/L 23   ALT 0 - 44 U/L 11       Latest Ref Rng & Units 04/29/2022   10:15 AM  CBC  WBC 4.0 - 10.5 K/uL 5.9   Hemoglobin 12.0 - 15.0 g/dL 12.4   Hematocrit 36.0 -  46.0 % 37.4   Platelets 150 - 400 K/uL 192     No images are attached to the encounter.  NM PET Image Restag (PS) Skull Base To Thigh  Result Date: 07/25/2022 CLINICAL DATA:  Subsequent treatment strategy for breast cancer with bone metastasis. Left breast cancer diagnosed in 2000. Left axillary nodal metastasis in 2018. Known left hip metastasis. Last  chemotherapy greater than 1 year ago. EXAM: NUCLEAR MEDICINE PET SKULL BASE TO THIGH TECHNIQUE: 10.3 mCi F-18 FDG was injected intravenously. Full-ring PET imaging was performed from the skull base to thigh after the radiotracer. CT data was obtained and used for attenuation correction and anatomic localization. Fasting blood glucose: 109 mg/dl COMPARISON:  Bone scan 12/18/2021. Chest abdomen and pelvic CT of 11/27/2021. The most recent PET of 11/21/2020 FINDINGS: Mediastinal blood pool activity: SUV max 1.9 Liver activity: SUV max NA NECK: Muscular activity within the neck and upper extremities is presumably secondary to motion after radiopharmaceutical injection. No cervical nodal hypermetabolism. Incidental CT findings: Left carotid atherosclerosis. No cervical adenopathy. Surgical changes about both globes. CHEST: No pulmonary parenchymal or thoracic nodal hypermetabolism. Incidental CT findings: Moderate cardiomegaly. Aortic and coronary artery calcification. No axillary, mediastinal, internal mammary adenopathy. ABDOMEN/PELVIS: No abdominopelvic parenchymal or nodal hypermetabolism. Incidental CT findings: Cholecystectomy. Normal adrenal glands. Normal noncontrast appearance of the spleen, pancreas. Extensive colonic diverticulosis. Hysterectomy. Abdominal aortic atherosclerosis. Pelvic floor laxity. SKELETON: No abnormal marrow activity. Incidental CT findings: Osteopenia. Lytic lesions throughout the thoracolumbar spine are similar and not tracer avid. Example within the right side of T2 at 1.5 cm on 65/2. IMPRESSION: 1. No hypermetabolic metastatic disease. 2. Similar treated osseous metastasis. 3. Incidental findings, including: Coronary artery atherosclerosis. Aortic Atherosclerosis (ICD10-I70.0). Small hiatal hernia. Electronically Signed   By: Abigail Miyamoto M.D.   On: 07/25/2022 16:23     Assessment and plan- Patient is a 88 y.o. female with metastatic ER positive breast Cancer with bone mets as  disease currently on letrozole here for routine follow-up  I have reviewed PET CT scan images independently and discussed findings with the patient which shows stable areas of old healed bone metastases.  No new or progressive disease.  She will therefore continue letrozole at this time.  At her age there is a concern for worsening osteo porosis on letrozole and I am repeating her bone density scan next month.  Her right femur neck scores have worsened over the last 2 years from -1.7 in 2022-2.1 in 2022.  If she has worsening osteoporosis I will consider switching her to tamoxifen but there is also  increased risk of DVT associated with tamoxifen.  I will see her back in 4 months with CBC with differential CMP and CA 27-29  I suspect her left hip pain and knee pain is secondary to chronic osteoarthritis and I recommend that she should see orthopedics again.   Visit Diagnosis 1. Carcinoma of breast metastatic to bone, unspecified laterality ([place])   2. Pain of left hip   3. Left knee pain, unspecified chronicity   4. Use of letrozole (Femara)      Dr. Randa Evens, MD, MPH [facility] [phone] 08/09/2022 3:59 PM

## 2022-08-13 ENCOUNTER — Telehealth: Payer: Self-pay | Admitting: *Deleted

## 2022-08-13 NOTE — Telephone Encounter (Signed)
Faxed to [facility] [phone] and pt wanted to see Dr. Marry Guan if possible. The faxed did go through and got transmission that it was sent

## 2022-08-26 ENCOUNTER — Other Ambulatory Visit: Payer: Self-pay | Admitting: Cardiovascular Disease

## 2022-08-26 ENCOUNTER — Other Ambulatory Visit: Payer: Self-pay

## 2022-08-26 NOTE — Telephone Encounter (Signed)
Medication refill for Ezetimibe 10 mg last ov 06/25/22, upcoming ov 12/26/22 . Please advise

## 2022-09-02 NOTE — Progress Notes (Unsigned)
If Dr. Ardiology Office Note  Date:  09/03/2022   ID:  Kelli Williams, DOB 11/08/1926, MRN [id number]  PCP:  Kelli Billings, NP   Chief Complaint  Patient presents with   6 month follow up     Patient c/o shortness of breath with little exertion. Medications reviewed by the patient verbally.     HPI:  [name] is a 88 year old woman  Mild aortic atherosclerosis Status post radiation treatment for breast cancer in 2000 on the left with 7 weeks of radiation on a daily basis.  She lost her husband in March 2016.   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 as disease currently on letrozole  Chronic leg swelling, B/L, symptoms are stable Unable to place TED hose Significant sitting, recliner Regular exercise or walking program, walks with a walker No falls No chest pain, no SOB  Reports she takes lisinopril 40 daily, Cardura HCTZ previously held by primary care to metoprolol Not on metoprolol on today's visit presumably held for bradycardia She is unaware of these medication changes  [place]ives at the [facility] [place] Lives in an apartment  EKG personally reviewed by myself on todays visit Nsr rate 71 PACs, no significant ST-T wave changes  Followed by oncology for cancer CT scan,  No evidence of progressive metastatic disease. 2. Unchanged lytic lesions of the thoracic and lumbar spine, compatible with osseous metastatic disease. 3.  Aortic Atherosclerosis (ICD10-I70.0).  mild to moderate aortic atherosclerosis  lymph node under left arm resected, tested positive for breast cancer Developed metastases to the bone XRT  To bones,  chemotherapy PET scan showing improvement in her metastases   CT scan coronary calcification and aortic atherosclerosis as well as  cardiomegaly  PET SCAN: Coronary, aortic arch, and branch vessel atherosclerotic vascular disease. Moderate cardiomegaly.  at least mild to moderate diffuse aortic atherosclerosis extending up to the carotids, some degree of coronary calcification/difficult to determine secondary to motion artifact    PMH:   has a past medical history of Anxiety, Arthritis, Breast cancer ([place]) (5329), Complication of anesthesia, Dysrhythmia, GERD (gastroesophageal reflux disease), Hypertension, Neuropathic pain of both legs, Neuropathy, Personal history of chemotherapy, Personal history of radiation therapy, Pneumonia (2016), Skin cancer of forehead, and Skin cancer of nose.  PSH:    Past Surgical History:  Procedure Laterality Date   ABDOMINAL HYSTERECTOMY     AXILLARY LYMPH NODE BIOPSY Left 07/10/2017   INVASIVE MAMMARY CARCINOMA WITH FOCAL LOBULAR FEATURES.    BREAST BIOPSY Bilateral 1960   benign   BREAST LUMPECTOMY Left 2000   breast ca with rad tx   BREAST LUMPECTOMY Left 08/20/2017   BREAST LUMPECTOMY Left 08/20/2017   Procedure: left breast wide excision;  Surgeon: Kelli Bellow, MD;  Location: [facility] ORS;  Service: General;  Laterality: Left;   CATARACT EXTRACTION     left eye   CHOLECYSTECTOMY     JOINT REPLACEMENT     NASAL SEPTUM SURGERY     PARTIAL KNEE ARTHROPLASTY     right   TOTAL VAGINAL HYSTERECTOMY      Current Outpatient Medications  Medication Sig Dispense Refill   acetaminophen (TYLENOL) 325 MG tablet Take 650 mg by mouth every 6 (six) hours as needed.     aspirin 81 MG tablet Take 1 tablet by mouth daily.  gabapentin (NEURONTIN) 300 MG capsule TAKE 3 CAPSULES BY MOUTH TWICE  DAILY 540 capsule 2   letrozole (FEMARA) 2.5 MG tablet TAKE 1 TABLET BY MOUTH  DAILY 90 tablet 3   meloxicam (MOBIC) 15 MG tablet TAKE 1 TABLET BY MOUTH DAILY 90 tablet 3   Multiple Vitamins-Minerals (MULTIVITAMIN WITH MINERALS) tablet Take 1 tablet by mouth daily.     omeprazole (PRILOSEC) 20  MG capsule Take 1 capsule (20 mg total) by mouth daily. 90 capsule 2   oxyCODONE (OXY IR/ROXICODONE) 5 MG immediate release tablet Take 2.5 mg by mouth 3 (three) times daily as needed.     doxazosin (CARDURA) 1 MG tablet Take 1 tablet (1 mg total) by mouth 2 (two) times daily. 180 tablet 3   ezetimibe (ZETIA) 10 MG tablet Take 1 tablet (10 mg total) by mouth daily. 90 tablet 3   lisinopril (ZESTRIL) 40 MG tablet Take 1 tablet (40 mg total) by mouth daily. Take 1 tablet (40 mg) by mouth once daily 90 tablet 3   No current facility-administered medications for this visit.    Allergies:   Amitriptyline, Amoxicillin, Black pepper [piper], Ivp dye [iodinated contrast media], Tape, and Sulfa antibiotics   Social History:  The patient  reports that she has never smoked. She has never used smokeless tobacco. She reports that she does not drink alcohol and does not use drugs.   Family History:   family history includes Breast cancer (age of onset: 57) in her daughter; Cancer in her paternal grandmother; Heart attack in her paternal uncle; Heart disease in her mother.    Review of Systems: Review of Systems  Constitutional: Negative.   HENT: Negative.    Respiratory: Negative.    Cardiovascular:  Positive for leg swelling.  Gastrointestinal: Negative.   Musculoskeletal:  Positive for joint pain.       Difficulty walking  Neurological: Negative.   Psychiatric/Behavioral: Negative.    All other systems reviewed and are negative.  PHYSICAL EXAM: VS:  BP (!) 142/60 (BP Location: Left Arm, Patient Position: Sitting, Cuff Size: Normal)   Pulse 71   Ht '5\' 3"'$  (1.6 m)   Wt 186 lb (84.4 kg)   SpO2 97%   BMI 32.95 kg/m  , BMI Body mass index is 32.95 kg/m. Constitutional:  oriented to person, place, and time. No distress.  HENT:  Head: Grossly normal Eyes:  no discharge. No scleral icterus.  Neck: No JVD, no carotid bruits  Cardiovascular: Regular rate and rhythm, no murmurs appreciated 1+  edema around the ankles bilaterally Pulmonary/Chest: Clear to auscultation bilaterally, no wheezes or rails Abdominal: Soft.  no distension.  no tenderness.  Musculoskeletal: Normal range of motion Neurological:  normal muscle tone. Coordination normal. No atrophy Skin: Skin warm and dry Psychiatric: normal affect, pleasant  Recent Labs: 02/19/2022: TSH 3.320 04/29/2022: ALT 11; BUN 23; Creatinine, Ser 0.95; Hemoglobin 12.4; Platelets 192; Potassium 3.4; Sodium 137    Lipid Panel Lab Results  Component Value Date   CHOL 135 02/19/2022   HDL 49 02/19/2022   LDLCALC 69 02/19/2022   TRIG 86 02/19/2022    Wt Readings from Last 3 Encounters:  09/03/22 186 lb (84.4 kg)  08/09/22 186 lb 6.4 oz (84.6 kg)  06/25/22 183 lb (83 kg)     ASSESSMENT AND PLAN:  Essential hypertension - Blood pressure high end of range Monitor for now Will stop potassium as she is off HCTZ  Atrial tachycardia ([place]) - Plan: EKG 12-Lead No sx,  off b-blocker secondary to bradycardia Maintinaing NSR Minimal sx of tachycardia  Adjustment disorder with other symptom  loss of her husband [place] Recommended walking program  Leg swelling -  Lymphedema, unable to place TED hose suggest leg elevation Not particularly interested in lymphedema compression pumps No indication of elevated right heart pressures on echo April 2023  Breast cancer Previously treated with chemotherapy medication has completed XRT Hx of metastases to bone Follow-up oncology  Aortic atherosclerosis/hyperlipidemia Mild-to-moderate in nature Continue  Zetia 10 mg daily Numbers improved  Coronary calcifiction Seen on CT scan mild ortic athero Continue zetia   Total encounter time more than 30 minutes  Greater than 50% was spent in counseling and coordination of care with the patient    Orders Placed This Encounter  Procedures   EKG 12-Lead     Signed, Esmond Plants, M.D., Ph.D. 09/03/2022   [place], [place]

## 2022-09-03 ENCOUNTER — Other Ambulatory Visit: Payer: Self-pay | Admitting: Oncology

## 2022-09-03 ENCOUNTER — Ambulatory Visit: Payer: Medicare Other | Attending: Cardiovascular Disease | Admitting: Cardiovascular Disease

## 2022-09-03 ENCOUNTER — Encounter: Payer: Self-pay | Admitting: Cardiovascular Disease

## 2022-09-03 VITALS — BP 142/60 | HR 71 | Ht 63.0 in | Wt 186.0 lb

## 2022-09-03 DIAGNOSIS — I10 Essential (primary) hypertension: Secondary | ICD-10-CM | POA: Diagnosis not present

## 2022-09-03 DIAGNOSIS — I739 Peripheral vascular disease, unspecified: Secondary | ICD-10-CM | POA: Diagnosis not present

## 2022-09-03 DIAGNOSIS — I700 Atherosclerosis of aorta: Secondary | ICD-10-CM

## 2022-09-03 DIAGNOSIS — R600 Localized edema: Secondary | ICD-10-CM | POA: Diagnosis not present

## 2022-09-03 DIAGNOSIS — I2510 Atherosclerotic heart disease of native coronary artery without angina pectoris: Secondary | ICD-10-CM

## 2022-09-03 MED ORDER — EZETIMIBE 10 MG PO TABS: 10.0000 mg | ORAL_TABLET | Freq: Every day | ORAL | 3 refills | Status: DC

## 2022-09-03 MED ORDER — LISINOPRIL 40 MG PO TABS: 40.0000 mg | ORAL_TABLET | Freq: Every day | ORAL | 3 refills | Status: DC

## 2022-09-03 MED ORDER — DOXAZOSIN MESYLATE 1 MG PO TABS: 1.0000 mg | ORAL_TABLET | Freq: Two times a day (BID) | ORAL | 3 refills | Status: DC

## 2022-09-03 NOTE — Patient Instructions (Addendum)
Medication Instructions:  Stop the potassium  If you need a refill on your cardiac medications before your next appointment, please call your pharmacy.   Lab work: No new labs needed  Testing/Procedures: No new testing needed  Follow-Up: At [facility], you and your health needs are our priority.  As part of our continuing mission to provide you with exceptional heart care, we have created designated Provider Care Teams.  These Care Teams include your primary Cardiologist (physician) and Advanced Practice Providers (APPs -  Physician Assistants and Nurse Practitioners) who all work together to provide you with the care you need, when you need it.  You will need a follow up appointment in 12 months  Providers on your designated Care Team:   Murray Hodgkins, NP Christell Faith, PA-C Cadence Kathlen Mody, [place]  COVID-19 Vaccine Information can be found at: [email or web address] For questions related to vaccine distribution or appointments, please email [email or web address] or call [phone].

## 2022-09-04 ENCOUNTER — Encounter: Payer: Self-pay | Admitting: Hematology and Oncology

## 2022-09-04 ENCOUNTER — Telehealth: Payer: Self-pay

## 2022-09-04 DIAGNOSIS — K589 Irritable bowel syndrome without diarrhea: Secondary | ICD-10-CM

## 2022-09-04 MED ORDER — OMEPRAZOLE 20 MG PO CPDR: 20.0000 mg | DELAYED_RELEASE_CAPSULE | Freq: Every day | ORAL | 1 refills | Status: DC

## 2022-09-04 NOTE — Telephone Encounter (Signed)
Med refill request from [facility], for omeprazole. Last filled 02/26/2022 #90 with 2 RF. Last seen in office 03/12/22. Upcoming appt with provider on 12/26/2022. Please advise.,

## 2022-09-05 ENCOUNTER — Telehealth: Payer: Self-pay | Admitting: *Deleted

## 2022-09-05 NOTE — Telephone Encounter (Signed)
Making sure that pt got an appt with Dr. Marry Guan. She has appt for 10/03/2022

## 2022-09-06 NOTE — Telephone Encounter (Signed)
Pt scheduled  

## 2022-09-19 ENCOUNTER — Ambulatory Visit
Admission: RE | Admit: 2022-09-19 | Discharge: 2022-09-19 | Disposition: A | Discharge status: Home / Self Care | Payer: Medicare Other | Source: Ambulatory Visit | Attending: Oncology | Admitting: Oncology

## 2022-09-19 DIAGNOSIS — Z13820 Encounter for screening for osteoporosis: Secondary | ICD-10-CM | POA: Insufficient documentation

## 2022-09-19 DIAGNOSIS — C7951 Secondary malignant neoplasm of bone: Secondary | ICD-10-CM | POA: Insufficient documentation

## 2022-09-19 DIAGNOSIS — Z923 Personal history of irradiation: Secondary | ICD-10-CM | POA: Diagnosis not present

## 2022-09-19 DIAGNOSIS — M8589 Other specified disorders of bone density and structure, multiple sites: Secondary | ICD-10-CM | POA: Diagnosis not present

## 2022-09-19 DIAGNOSIS — C50919 Malignant neoplasm of unspecified site of unspecified female breast: Secondary | ICD-10-CM | POA: Insufficient documentation

## 2022-09-19 DIAGNOSIS — Z780 Asymptomatic menopausal state: Secondary | ICD-10-CM | POA: Insufficient documentation

## 2022-09-19 DIAGNOSIS — M810 Age-related osteoporosis without current pathological fracture: Secondary | ICD-10-CM | POA: Diagnosis not present

## 2022-09-24 ENCOUNTER — Other Ambulatory Visit: Payer: Medicare Other | Admitting: Student

## 2022-09-26 ENCOUNTER — Ambulatory Visit: Payer: Medicare Other | Admitting: Podiatry

## 2022-10-03 DIAGNOSIS — M25551 Pain in right hip: Secondary | ICD-10-CM | POA: Diagnosis not present

## 2022-10-03 DIAGNOSIS — M25552 Pain in left hip: Secondary | ICD-10-CM | POA: Diagnosis not present

## 2022-10-03 DIAGNOSIS — G8929 Other chronic pain: Secondary | ICD-10-CM | POA: Diagnosis not present

## 2022-10-03 DIAGNOSIS — M1712 Unilateral primary osteoarthritis, left knee: Secondary | ICD-10-CM | POA: Diagnosis not present

## 2022-10-03 DIAGNOSIS — M25561 Pain in right knee: Secondary | ICD-10-CM | POA: Diagnosis not present

## 2022-10-04 ENCOUNTER — Other Ambulatory Visit: Payer: Self-pay | Admitting: Internal Medicine

## 2022-10-04 DIAGNOSIS — G5793 Unspecified mononeuropathy of bilateral lower limbs: Secondary | ICD-10-CM

## 2022-10-07 ENCOUNTER — Ambulatory Visit: Payer: Self-pay | Admitting: *Deleted

## 2022-10-07 NOTE — Telephone Encounter (Signed)
Unable to refill per protocol, request is too soon. Last refill 03/27/22 for 90 and 2 refills. Should have enough until December.E-Prescribing Status: Receipt confirmed by pharmacy (03/27/2022 8:59 AM EDT). Will refuse.   Requested Prescriptions  Pending Prescriptions Disp Refills  . gabapentin (NEURONTIN) 300 MG capsule [Pharmacy Med Name: Gabapentin 300 MG Oral Capsule] 600 capsule 2    Sig: TAKE 3 CAPSULES BY MOUTH TWICE  DAILY     Neurology: Anticonvulsants - gabapentin Passed - 10/04/2022 10:23 PM      Passed - Cr in normal range and within 360 days    Creatinine  Date Value Ref Range Status  02/06/2012 0.76 0.60 - 1.30 mg/dL Final   Creatinine, Ser  Date Value Ref Range Status  04/29/2022 0.95 0.44 - 1.00 mg/dL Final         Passed - Completed PHQ-2 or PHQ-9 in the last 360 days      Passed - Valid encounter within last 12 months    Recent Outpatient Visits          3 months ago Sinus congestion   [facility] Kathrine Haddock, NP   6 months ago Chronic intractable headache, unspecified headache type   [facility] Vigg, Avanti, MD   7 months ago DNR (do not resuscitate)   [facility] Vigg, Avanti, MD   8 months ago Encounter to establish care   [facility] Vigg, Avanti, MD   1 year ago Knee swelling   [facility], Jesse Sans, MD      Future Appointments            In 2 weeks  [facility], [place]   In 4 weeks Jon Billings, [employer], [place]   In 2 months Jon Billings, NP [facility], [place]

## 2022-10-07 NOTE — Telephone Encounter (Addendum)
Summary: Vaccine advice   Pt is calling to ask the nurse is it recommended to have the COVID and flu shot on the same day in different arms. Please advise      Reason for Disposition  COVID-19 Prevention and Healthy Living, questions about  Answer Assessment - Initial Assessment Questions 1. MAIN CONCERN OR SYMPTOM:  "What is your main concern right now?" "What question do you have?" "What's the main symptom you're worried about?" (e.g., fever, pain, redness, swelling)     Can she get vaccines on same day?  Protocols used: Coronavirus (COVID-19) Vaccine Questions and Reactions-A-AH  Per CDC guideline: No contraindications for getting flu and COVID vaccines on same day. Would advised different arms so if there is a local reaction the vaccine causing can be distinguished. If patient only has one arm she normally gets vaccine in- then she may want to spread them out.

## 2022-10-07 NOTE — Telephone Encounter (Signed)
Reason for Disposition . COVID-19 vaccine, Frequently Asked Questions (FAQs)  Protocols used: Coronavirus (COVID-19) Vaccine Questions and Reactions-A-AH

## 2022-10-09 DIAGNOSIS — M1712 Unilateral primary osteoarthritis, left knee: Secondary | ICD-10-CM | POA: Diagnosis not present

## 2022-10-16 DIAGNOSIS — M1712 Unilateral primary osteoarthritis, left knee: Secondary | ICD-10-CM | POA: Diagnosis not present

## 2022-10-22 ENCOUNTER — Ambulatory Visit (INDEPENDENT_AMBULATORY_CARE_PROVIDER_SITE_OTHER): Payer: Medicare Other | Admitting: *Deleted

## 2022-10-22 DIAGNOSIS — Z0000 Encounter for general adult medical examination without abnormal findings: Secondary | ICD-10-CM

## 2022-10-22 NOTE — Progress Notes (Signed)
Subjective:   Kelli Williams is a 88 y.o. female who presents for Medicare Annual (Subsequent) preventive examination.  I connected with  Kelli Williams on 10/22/22 by a telephone enabled telemedicine application and verified that I am speaking with the correct person using two identifiers.   I discussed the limitations of evaluation and management by telemedicine. The patient expressed understanding and agreed to proceed.  Patient location: home  Provider location: Tele-health-home    Review of Systems     Cardiac Risk Factors include: advanced age (>76mn, >>80women);obesity (BMI >30kg/m2);sedentary lifestyle;family history of premature cardiovascular disease;hypertension     Objective:    Today's Vitals   10/22/22 1306  PainSc: 8    There is no height or weight on file to calculate BMI.     10/22/2022    1:10 PM 08/09/2022    1:25 PM 04/29/2022   10:25 AM 12/28/2021    1:38 PM 10/22/2021    1:07 PM 08/28/2021    1:12 PM 06/25/2021    1:18 PM  Advanced Directives  Does Patient Have a Medical Advance Directive? No Yes Yes Yes Yes Yes No  Type of Advance Directive  Out of facility DNR (pink MOST or yellow form) Living will;Healthcare Power of [place]  [place]   Does patient want to make changes to medical advance directive?     Yes (MAU/Ambulatory/Procedural Areas - Information given)    Copy of [place] Chart? No - copy requested          Current Medications (verified) Outpatient Encounter Medications as of 10/22/2022  Medication Sig   acetaminophen (TYLENOL) 325 MG tablet Take 650 mg by mouth every 6 (six) hours as needed.   aspirin 81 MG tablet Take 1 tablet by mouth daily.   doxazosin (CARDURA) 1 MG tablet Take 1 tablet (1 mg total) by mouth 2 (two) times daily.   ezetimibe (ZETIA) 10 MG tablet Take 1 tablet (10 mg total) by mouth daily.   gabapentin (NEURONTIN) 300  MG capsule TAKE 3 CAPSULES BY MOUTH TWICE  DAILY   letrozole (FEMARA) 2.5 MG tablet TAKE 1 TABLET BY MOUTH DAILY   lisinopril (ZESTRIL) 40 MG tablet Take 1 tablet (40 mg total) by mouth daily. Take 1 tablet (40 mg) by mouth once daily   meloxicam (MOBIC) 15 MG tablet TAKE 1 TABLET BY MOUTH DAILY   Multiple Vitamins-Minerals (MULTIVITAMIN WITH MINERALS) tablet Take 1 tablet by mouth daily.   omeprazole (PRILOSEC) 20 MG capsule Take 1 capsule (20 mg total) by mouth daily.   oxyCODONE (OXY IR/ROXICODONE) 5 MG immediate release tablet Take 2.5 mg by mouth 3 (three) times daily as needed.   No facility-administered encounter medications on file as of 10/22/2022.    Allergies (verified) Amitriptyline, Amoxicillin, Black pepper [piper], Ivp dye [iodinated contrast media], Tape, and Sulfa antibiotics   History: Past Medical History:  Diagnosis Date   Anxiety    Arthritis    BOTH FEET AND KNEES   Breast cancer ([place] 2000   left breast ca with lumpectomy and rad tx 5 yr tamoxifen/[facility]  Complication of anesthesia    WOKE UP DURING SURGERY FOR DEVIATED SEPTUM, KNEE REPLACEMENT AND BREAST LUMPECTOMY   Dysrhythmia    GERD (gastroesophageal reflux disease)    Hypertension    Neuropathic pain of both legs    Neuropathy    Personal history of chemotherapy  Personal history of radiation therapy    Pneumonia 2016   Skin cancer of forehead    Skin cancer of nose    Past Surgical History:  Procedure Laterality Date   ABDOMINAL HYSTERECTOMY     AXILLARY LYMPH NODE BIOPSY Left 07/10/2017   INVASIVE MAMMARY CARCINOMA WITH FOCAL LOBULAR FEATURES.    BREAST BIOPSY Bilateral 1960   benign   BREAST LUMPECTOMY Left 2000   breast ca with rad tx   BREAST LUMPECTOMY Left 08/20/2017   BREAST LUMPECTOMY Left 08/20/2017   Procedure: left breast wide excision;  Surgeon: Robert Bellow, MD;  Location: [facility] ORS;  Service: General;  Laterality: Left;   CATARACT EXTRACTION     left eye    CHOLECYSTECTOMY     JOINT REPLACEMENT     NASAL SEPTUM SURGERY     PARTIAL KNEE ARTHROPLASTY     right   TOTAL VAGINAL HYSTERECTOMY     Family History  Problem Relation Age of Onset   Heart disease Mother    Heart attack Paternal Uncle    Breast cancer Daughter 36       genetic negative   Cancer Paternal Grandmother    Social History   Socioeconomic History   Marital status: Widowed    Spouse name: Not on file   Number of children: 3   Years of education: some college   Highest education level: 12th grade  Occupational History   Occupation: Retired  Tobacco Use   Smoking status: Never   Smokeless tobacco: Never   Tobacco comments:    smoking cessation materials not required  Vaping Use   Vaping Use: Never used  Substance and Sexual Activity   Alcohol use: No    Alcohol/week: 0.0 standard drinks of alcohol   Drug use: No   Sexual activity: Not Currently  Other Topics Concern   Not on file  Social History Narrative   Patient lives in independent living at [facility] Strain: Low Risk  (10/22/2022)   Overall Financial Resource Strain (CARDIA)    Difficulty of Paying Living Expenses: Not hard at all  Food Insecurity: No Food Insecurity (10/22/2022)   Hunger Vital Sign    Worried About Running Out of Food in the Last Year: Never true    Stella in the Last Year: Never true  Transportation Needs: No Transportation Needs (10/22/2022)   PRAPARE - Hydrologist (Medical): No    Lack of Transportation (Non-Medical): No  Physical Activity: Inactive (10/22/2022)   Exercise Vital Sign    Days of Exercise per Week: 0 days    Minutes of Exercise per Session: 0 min  Stress: No Stress Concern Present (10/22/2022)   [place]    Feeling of Stress : Not at all  Social Connections: Socially Isolated (10/22/2022)    Social Connection and Isolation Panel [NHANES]    Frequency of Communication with Friends and Family: More than three times a week    Frequency of Social Gatherings with Friends and Family: Once a week    Attends Religious Services: Never    Marine scientist or Organizations: No    Attends Archivist Meetings: Never    Marital Status: Widowed    Tobacco Counseling Counseling given: Not Answered Tobacco comments: smoking cessation materials not required   Clinical Intake:  Pre-visit  preparation completed: Yes  Pain : 0-10 Pain Score: 8  Pain Type: Chronic pain Pain Location: Knee Pain Orientation: Left, Right Pain Descriptors / Indicators: Burning, Aching, Constant, Discomfort Pain Onset: More than a month ago Pain Frequency: Constant Pain Relieving Factors: tylenol Effect of Pain on Daily Activities: yes  Pain Relieving Factors: tylenol  Diabetes: No  How often do you need to have someone help you when you read instructions, pamphlets, or other written materials from your doctor or pharmacy?: 1 - Never  Diabetic?     no  Interpreter Needed?: No  Information entered by :: Leroy Kennedy LPN   Activities of Daily Living    10/22/2022    1:22 PM  In your present state of health, do you have any difficulty performing the following activities:  Hearing? 0  Vision? 0  Difficulty concentrating or making decisions? 0  Walking or climbing stairs? 1  Dressing or bathing? 0  Doing errands, shopping? 1  Preparing Food and eating ? N  Using the Toilet? N  In the past six months, have you accidently leaked urine? Y  Do you have problems with loss of bowel control? N  Managing your Medications? N  Managing your Finances? N    Patient Care Team: Jon Billings, NP as PCP - General (Nurse Practitioner) [name], MD as Consulting Physician (Cardiology) Renata Caprice as Physician Assistant (Orthopedic Surgery) Bary Castilla Forest Gleason, MD  (General Surgery) Hessie Knows, MD as Consulting Physician (Orthopedic Surgery) Sindy Guadeloupe, MD as Consulting Physician (Hematology and Oncology)  Indicate any recent Medical Services you may have received from other than [facility] providers in the past year (date may be approximate).     Assessment:   This is a routine wellness examination for Kelli Williams.  Hearing/Vision screen Hearing Screening - Comments:: No trouble hearing Vision Screening - Comments:: Up to date [facility]  Dietary issues and exercise activities discussed: Current Exercise Habits: The patient does not participate in regular exercise at present, Exercise limited by: orthopedic condition(s)   Goals Addressed             This Visit's Progress    DIET - INCREASE WATER INTAKE   On track    Recommend to drink at least 6-8 8oz glasses of water per day.     Patient Stated       No goals       Depression Screen    10/22/2022    1:21 PM 03/12/2022   11:22 AM 02/26/2022   12:51 PM 10/22/2021    1:06 PM 04/11/2021    2:46 PM 10/18/2020    3:04 PM 06/21/2020   10:44 AM  PHQ 2/9 Scores  PHQ - 2 Score 1 0 0 0 0 0 0  PHQ- 9 Score '3 1 2  '$ 0  0    Fall Risk    10/22/2022    1:11 PM 06/25/2022    1:18 PM 03/12/2022   10:42 AM 02/26/2022   12:51 PM 10/22/2021    1:12 PM  Fall Risk   Falls in the past year? 1 1 0 1 1  Number falls in past yr: 1 1 0 1 0  Injury with Fall? 0 0 0 1 0  Risk for fall due to :  History of fall(s);Impaired balance/gait;Impaired mobility Impaired balance/gait;Impaired mobility History of fall(s) No Fall Risks  Follow up Falls evaluation completed;Education provided;Falls prevention discussed Falls evaluation completed Falls evaluation completed Falls evaluation completed Falls prevention  discussed    FALL RISK PREVENTION PERTAINING TO THE HOME:  Any stairs in or around the home? No  If so, are there any without handrails? No  Home free of loose throw rugs in walkways, pet beds,  electrical cords, etc? Yes  Adequate lighting in your home to reduce risk of falls? Yes   ASSISTIVE DEVICES UTILIZED TO PREVENT FALLS:  Life alert? Yes  Use of a cane, walker or w/c? Yes  Grab bars in the bathroom? Yes  Shower chair or bench in shower? Yes  Elevated toilet seat or a handicapped toilet? Yes   TIMED UP AND GO:  Was the test performed? No .    Cognitive Function:        10/22/2022    1:14 PM 10/13/2019   10:39 AM 06/29/2018   11:26 AM 06/27/2017   10:47 AM  6CIT Screen  What Year? 0 points 0 points 0 points 0 points  What month? 0 points 0 points 0 points 0 points  What time? 0 points 0 points 0 points 0 points  Count back from 20 0 points 0 points 0 points 0 points  Months in reverse 0 points 0 points 0 points 0 points  Repeat phrase 0 points 0 points 0 points 2 points  Total Score 0 points 0 points 0 points 2 points    Immunizations Immunization History  Administered Date(s) Administered   Fluad Quad(high Dose 65+) 10/12/2019, 09/25/2020   Influenza, High Dose Seasonal PF 09/23/2017, 09/10/2018, 10/01/2021   Influenza,inj,Quad PF,6+ Mos 10/16/2016   Influenza-Unspecified 09/02/2015   Moderna Covid-19 Vaccine Bivalent Booster 69yr & up 10/01/2021   PFIZER(Purple Top)SARS-COV-2 Vaccination 12/16/2019, 01/10/2020, 11/08/2020   Pneumococcal Conjugate-13 09/08/2014   Pneumococcal Polysaccharide-23 09/14/1994, 04/30/1997   Pneumococcal-Unspecified 09/14/1994   Tdap 06/09/2011   Zoster, Live 05/12/2006    TDAP status: Due, Education has been provided regarding the importance of this vaccine. Advised may receive this vaccine at local pharmacy or Health Dept. Aware to provide a copy of the vaccination record if obtained from local pharmacy or Health Dept. Verbalized acceptance and understanding.  Flu Vaccine status: Due, Education has been provided regarding the importance of this vaccine. Advised may receive this vaccine at local pharmacy or Health Dept.  Aware to provide a copy of the vaccination record if obtained from local pharmacy or Health Dept. Verbalized acceptance and understanding.  Pneumococcal vaccine status: Up to date  Covid-19 vaccine status: Information provided on how to obtain vaccines.   Qualifies for Shingles Vaccine? Yes   Zostavax completed Yes   Shingrix Completed?: No.    Education has been provided regarding the importance of this vaccine. Patient has been advised to call insurance company to determine out of pocket expense if they have not yet received this vaccine. Advised may also receive vaccine at local pharmacy or Health Dept. Verbalized acceptance and understanding.  Screening Tests Health Maintenance  Topic Date Due   INFLUENZA VACCINE  07/09/2022   COVID-19 Vaccine (5 - Pfizer risk series) 11/07/2022 (Originally 11/26/2021)   Zoster Vaccines- Shingrix (1 of 2) 01/22/2023 (Originally 12/31/1944)   TETANUS/TDAP  03/13/2023 (Originally 06/08/2021)   Medicare Annual Wellness (AWV)  10/23/2023   Pneumonia Vaccine 88 Years old  Completed   DEXA SCAN  Completed   HPV VACCINES  Aged Out    Health Maintenance  Health Maintenance Due  Topic Date Due   INFLUENZA VACCINE  07/09/2022    Colorectal cancer screening: No longer required.   Mammogram status:  No longer required due to age.  Bone Density status: Completed 2023. Results reflect: Bone density results: OSTEOPOROSIS. Repeat every 2 years.  Lung Cancer Screening: (Low Dose CT Chest recommended if Age 51-80 years, 30 pack-year currently smoking OR have quit w/in 15years.) does not qualify.   Lung Cancer Screening Referral:   Additional Screening:  Hepatitis C Screening: does not qualify  Vision Screening: Recommended annual ophthalmology exams for early detection of glaucoma and other disorders of the eye. Is the patient up to date with their annual eye exam?  Yes  Who is the provider or what is the name of the office in which the patient attends  annual eye exams? [facility] If pt is not established with a provider, would they like to be referred to a provider to establish care? No .   Dental Screening: Recommended annual dental exams for proper oral hygiene  Community Resource Referral / Chronic Care Management: CRR required this visit?  No   CCM required this visit?  No      Plan:     I have personally reviewed and noted the following in the patient's chart:   Medical and social history Use of alcohol, tobacco or illicit drugs  Current medications and supplements including opioid prescriptions. Patient is not currently taking opioid prescriptions. Functional ability and status Nutritional status Physical activity Advanced directives List of other physicians Hospitalizations, surgeries, and ER visits in previous 12 months Vitals Screenings to include cognitive, depression, and falls Referrals and appointments  In addition, I have reviewed and discussed with patient certain preventive protocols, quality metrics, and best practice recommendations. A written personalized care plan for preventive services as well as general preventive health recommendations were provided to patient.     Leroy Kennedy, LPN   05/69/7948   Nurse Notes:

## 2022-10-22 NOTE — Patient Instructions (Signed)
Kelli Williams , Thank you for taking time to come for your Medicare Wellness Visit. I appreciate your ongoing commitment to your health goals. Please review the following plan we discussed and let me know if I can assist you in the future.   These are the goals we discussed:  Goals      DIET - INCREASE WATER INTAKE     Recommend to drink at least 6-8 8oz glasses of water per day.     Patient Stated     No goals        This is a list of the screening recommended for you and due dates:  Health Maintenance  Topic Date Due   Flu Shot  07/09/2022   COVID-19 Vaccine (5 - Pfizer risk series) 11/07/2022*   Zoster (Shingles) Vaccine (1 of 2) 01/22/2023*   Tetanus Vaccine  03/13/2023*   Medicare Annual Wellness Visit  10/23/2023   Pneumonia Vaccine  Completed   DEXA scan (bone density measurement)  Completed   HPV Vaccine  Aged Out  *Topic was postponed. The date shown is not the original due date.    Advanced directives: Education provided  Conditions/risks identified:   Next appointment: Follow up in one year for your annual wellness visit 11-05-2022 @ 1:00  [facility] 65 Years and Older, Female Preventive care refers to lifestyle choices and visits with your health care provider that can promote health and wellness. What does preventive care include? A yearly physical exam. This is also called an annual well check. Dental exams once or twice a year. Routine eye exams. Ask your health care provider how often you should have your eyes checked. Personal lifestyle choices, including: Daily care of your teeth and gums. Regular physical activity. Eating a healthy diet. Avoiding tobacco and drug use. Limiting alcohol use. Practicing safe sex. Taking low-dose aspirin every day. Taking vitamin and mineral supplements as recommended by your health care provider. What happens during an annual well check? The services and screenings done by your health care provider  during your annual well check will depend on your age, overall health, lifestyle risk factors, and family history of disease. Counseling  Your health care provider may ask you questions about your: Alcohol use. Tobacco use. Drug use. Emotional well-being. Home and relationship well-being. Sexual activity. Eating habits. History of falls. Memory and ability to understand (cognition). Work and work Statistician. Reproductive health. Screening  You may have the following tests or measurements: Height, weight, and BMI. Blood pressure. Lipid and cholesterol levels. These may be checked every 5 years, or more frequently if you are over 53 years old. Skin check. Lung cancer screening. You may have this screening every year starting at age 47 if you have a 30-pack-year history of smoking and currently smoke or have quit within the past 15 years. Fecal occult blood test (FOBT) of the stool. You may have this test every year starting at age 41. Flexible sigmoidoscopy or colonoscopy. You may have a sigmoidoscopy every 5 years or a colonoscopy every 10 years starting at age 38. Hepatitis C blood test. Hepatitis B blood test. Sexually transmitted disease (STD) testing. Diabetes screening. This is done by checking your blood sugar (glucose) after you have not eaten for a while (fasting). You may have this done every 1-3 years. Bone density scan. This is done to screen for osteoporosis. You may have this done starting at age 54. Mammogram. This may be done every 1-2 years. Talk to your  health care provider about how often you should have regular mammograms. Talk with your health care provider about your test results, treatment options, and if necessary, the need for more tests. Vaccines  Your health care provider may recommend certain vaccines, such as: Influenza vaccine. This is recommended every year. Tetanus, diphtheria, and acellular pertussis (Tdap, Td) vaccine. You may need a Td booster every  10 years. Zoster vaccine. You may need this after age 12. Pneumococcal 13-valent conjugate (PCV13) vaccine. One dose is recommended after age 76. Pneumococcal polysaccharide (PPSV23) vaccine. One dose is recommended after age 73. Talk to your health care provider about which screenings and vaccines you need and how often you need them. This information is not intended to replace advice given to you by your health care provider. Make sure you discuss any questions you have with your health care provider. Document Released: 12/22/2015 Document Revised: 08/14/2016 Document Reviewed: 09/26/2015 Elsevier Interactive Patient Education  2017 [employer] Prevention in the Home Falls can cause injuries. They can happen to people of all ages. There are many things you can do to make your home safe and to help prevent falls. What can I do on the outside of my home? Regularly fix the edges of walkways and driveways and fix any cracks. Remove anything that might make you trip as you walk through a door, such as a raised step or threshold. Trim any bushes or trees on the path to your home. Use bright outdoor lighting. Clear any walking paths of anything that might make someone trip, such as rocks or tools. Regularly check to see if handrails are loose or broken. Make sure that both sides of any steps have handrails. Any raised decks and porches should have guardrails on the edges. Have any leaves, snow, or ice cleared regularly. Use sand or salt on walking paths during winter. Clean up any spills in your garage right away. This includes oil or grease spills. What can I do in the bathroom? Use night lights. Install grab bars by the toilet and in the tub and shower. Do not use towel bars as grab bars. Use non-skid mats or decals in the tub or shower. If you need to sit down in the shower, use a plastic, non-slip stool. Keep the floor dry. Clean up any water that spills on the floor as soon as it  happens. Remove soap buildup in the tub or shower regularly. Attach bath mats securely with double-sided non-slip rug tape. Do not have throw rugs and other things on the floor that can make you trip. What can I do in the bedroom? Use night lights. Make sure that you have a light by your bed that is easy to reach. Do not use any sheets or blankets that are too big for your bed. They should not hang down onto the floor. Have a firm chair that has side arms. You can use this for support while you get dressed. Do not have throw rugs and other things on the floor that can make you trip. What can I do in the kitchen? Clean up any spills right away. Avoid walking on wet floors. Keep items that you use a lot in easy-to-reach places. If you need to reach something above you, use a strong step stool that has a grab bar. Keep electrical cords out of the way. Do not use floor polish or wax that makes floors slippery. If you must use wax, use non-skid floor wax. Do not have  throw rugs and other things on the floor that can make you trip. What can I do with my stairs? Do not leave any items on the stairs. Make sure that there are handrails on both sides of the stairs and use them. Fix handrails that are broken or loose. Make sure that handrails are as long as the stairways. Check any carpeting to make sure that it is firmly attached to the stairs. Fix any carpet that is loose or worn. Avoid having throw rugs at the top or bottom of the stairs. If you do have throw rugs, attach them to the floor with carpet tape. Make sure that you have a light switch at the top of the stairs and the bottom of the stairs. If you do not have them, ask someone to add them for you. What else can I do to help prevent falls? Wear shoes that: Do not have high heels. Have rubber bottoms. Are comfortable and fit you well. Are closed at the toe. Do not wear sandals. If you use a stepladder: Make sure that it is fully opened.  Do not climb a closed stepladder. Make sure that both sides of the stepladder are locked into place. Ask someone to hold it for you, if possible. Clearly mark and make sure that you can see: Any grab bars or handrails. First and last steps. Where the edge of each step is. Use tools that help you move around (mobility aids) if they are needed. These include: Canes. Walkers. Scooters. Crutches. Turn on the lights when you go into a dark area. Replace any light bulbs as soon as they burn out. Set up your furniture so you have a clear path. Avoid moving your furniture around. If any of your floors are uneven, fix them. If there are any pets around you, be aware of where they are. Review your medicines with your doctor. Some medicines can make you feel dizzy. This can increase your chance of falling. Ask your doctor what other things that you can do to help prevent falls. This information is not intended to replace advice given to you by your health care provider. Make sure you discuss any questions you have with your health care provider. Document Released: 09/21/2009 Document Revised: 05/02/2016 Document Reviewed: 12/30/2014 Elsevier Interactive Patient Education  2017 [employer].

## 2022-10-23 ENCOUNTER — Ambulatory Visit: Payer: Medicare Other

## 2022-10-23 DIAGNOSIS — M1712 Unilateral primary osteoarthritis, left knee: Secondary | ICD-10-CM | POA: Diagnosis not present

## 2022-11-04 NOTE — Progress Notes (Unsigned)
   There were no vitals taken for this visit.   Subjective:    Patient ID: Kelli Williams, female    DOB: 1926/05/10, 88 y.o.   MRN: [id number]  HPI: Kelli Williams is a 88 y.o. female  No chief complaint on file.  HYPERTENSION / HYPERLIPIDEMIA Satisfied with current treatment? {Blank single:19197::"yes","no"} Duration of hypertension: {Blank single:19197::"chronic","months","years"} BP monitoring frequency: {Blank single:19197::"not checking","rarely","daily","weekly","monthly","a few times a day","a few times a week","a few times a month"} BP range:  BP medication side effects: {Blank single:19197::"yes","no"} Past BP meds: {Blank OFBPZWCH:85277::"OEUM","PNTIRWERXV","QMGQQPYPPJ/KDTOIZTIWP","YKDXIPJA","SNKNLZJQBH","ALPFXTKWIO/XBDZ","HGDJMEQAST (bystolic)","carvedilol","chlorthalidone","clonidine","diltiazem","exforge HCT","HCTZ","irbesartan (avapro)","labetalol","lisinopril","lisinopril-HCTZ","losartan (cozaar)","methyldopa","nifedipine","olmesartan (benicar)","olmesartan-HCTZ","quinapril","ramipril","spironalactone","tekturna","valsartan","valsartan-HCTZ","verapamil"} Duration of hyperlipidemia: {Blank single:19197::"chronic","months","years"} Cholesterol medication side effects: {Blank single:19197::"yes","no"} Cholesterol supplements: {Blank multiple:19196::"none","fish oil","niacin","red yeast rice"} Past cholesterol medications: {Blank multiple:19196::"none","atorvastain (lipitor)","lovastatin (mevacor)","pravastatin (pravachol)","rosuvastatin (crestor)","simvastatin (zocor)","vytorin","fenofibrate (tricor)","gemfibrozil","ezetimide (zetia)","niaspan","lovaza"} Medication compliance: {Blank single:19197::"excellent compliance","good compliance","fair compliance","poor compliance"} Aspirin: {Blank single:19197::"yes","no"} Recent stressors: {Blank single:19197::"yes","no"} Recurrent headaches: {Blank single:19197::"yes","no"} Visual changes: {Blank  single:19197::"yes","no"} Palpitations: {Blank single:19197::"yes","no"} Dyspnea: {Blank single:19197::"yes","no"} Chest pain: {Blank single:19197::"yes","no"} Lower extremity edema: {Blank single:19197::"yes","no"} Dizzy/lightheaded: {Blank single:19197::"yes","no"}  Relevant past medical, surgical, family and social history reviewed and updated as indicated. Interim medical history since our last visit reviewed. Allergies and medications reviewed and updated.  Review of Systems  Per HPI unless specifically indicated above     Objective:    There were no vitals taken for this visit.  Wt Readings from Last 3 Encounters:  09/03/22 186 lb (84.4 kg)  08/09/22 186 lb 6.4 oz (84.6 kg)  06/25/22 183 lb (83 kg)    Physical Exam  Results for orders placed or performed during the hospital encounter of 07/24/22  Glucose, capillary  Result Value Ref Range   Glucose-Capillary 109 (H) 70 - 99 mg/dL      Assessment & Plan:   Problem List Items Addressed This Visit       Cardiovascular and Mediastinum   Essential hypertension   PAD (peripheral artery disease) ([place])   Aortic atherosclerosis ([place]) - Primary     Follow up plan: No follow-ups on file.

## 2022-11-05 ENCOUNTER — Ambulatory Visit (INDEPENDENT_AMBULATORY_CARE_PROVIDER_SITE_OTHER): Payer: Medicare Other | Admitting: Nurse Practitioner

## 2022-11-05 ENCOUNTER — Encounter: Payer: Self-pay | Admitting: Nurse Practitioner

## 2022-11-05 VITALS — BP 130/58 | HR 59 | Temp 98.3°F | Ht 63.0 in | Wt 186.4 lb

## 2022-11-05 DIAGNOSIS — I739 Peripheral vascular disease, unspecified: Secondary | ICD-10-CM

## 2022-11-05 DIAGNOSIS — I700 Atherosclerosis of aorta: Secondary | ICD-10-CM | POA: Diagnosis not present

## 2022-11-05 DIAGNOSIS — I10 Essential (primary) hypertension: Secondary | ICD-10-CM | POA: Diagnosis not present

## 2022-11-05 DIAGNOSIS — Z23 Encounter for immunization: Secondary | ICD-10-CM | POA: Diagnosis not present

## 2022-11-05 DIAGNOSIS — K589 Irritable bowel syndrome without diarrhea: Secondary | ICD-10-CM | POA: Diagnosis not present

## 2022-11-05 MED ORDER — OMEPRAZOLE 20 MG PO CPDR: 20.0000 mg | DELAYED_RELEASE_CAPSULE | Freq: Every day | ORAL | 1 refills | Status: DC

## 2022-11-05 NOTE — Assessment & Plan Note (Signed)
Chronic.  Controlled.  Continue with current medication regimen.  Labs ordered today.  Return to clinic in 6 months for reevaluation.  Call sooner if concerns arise.  ? ?

## 2022-11-05 NOTE — Assessment & Plan Note (Signed)
Chronic.  Controlled.  Continue with current medication regimen.  Followed by Cardiology.  Labs ordered today.  Return to clinic in 6 months for reevaluation.  Call sooner if concerns arise.

## 2022-11-05 NOTE — Assessment & Plan Note (Signed)
Chronic.  Controlled.  Continue with current medication regimen of Lisinopril '40mg'$ .  Refills sent today.  Labs ordered today.  Return to clinic in 6 months for reevaluation.  Call sooner if concerns arise.

## 2022-11-05 NOTE — Assessment & Plan Note (Signed)
Chronic.  Controlled.  Continue with current medication regimen of Zetia.  Refills sent today.  Labs ordered today.  Return to clinic in 6 months for reevaluation.  Call sooner if concerns arise.

## 2022-11-06 LAB — COMPREHENSIVE METABOLIC PANEL
ALT: 6 IU/L (ref 0–32)
AST: 15 IU/L (ref 0–40)
Albumin/Globulin Ratio: 1.6 (ref 1.2–2.2)
Albumin: 4.3 g/dL (ref 3.6–4.6)
Alkaline Phosphatase: 82 IU/L (ref 44–121)
BUN/Creatinine Ratio: 28 (ref 12–28)
BUN: 28 mg/dL (ref 10–36)
Bilirubin Total: 0.3 mg/dL (ref 0.0–1.2)
CO2: 25 mmol/L (ref 20–29)
Calcium: 9.6 mg/dL (ref 8.7–10.3)
Chloride: 100 mmol/L (ref 96–106)
Creatinine, Ser: 1.01 mg/dL — ABNORMAL HIGH (ref 0.57–1.00)
Globulin, Total: 2.7 g/dL (ref 1.5–4.5)
Glucose: 89 mg/dL (ref 70–99)
Potassium: 4 mmol/L (ref 3.5–5.2)
Sodium: 140 mmol/L (ref 134–144)
Total Protein: 7 g/dL (ref 6.0–8.5)
eGFR: 52 mL/min/{1.73_m2} — ABNORMAL LOW (ref >59)

## 2022-11-06 NOTE — Progress Notes (Signed)
Please let patient know that her lab work looks good.  No concerns at this time.  We will continue to monitor at future appointments.

## 2022-12-09 ENCOUNTER — Other Ambulatory Visit: Payer: Self-pay | Admitting: Internal Medicine

## 2022-12-09 DIAGNOSIS — G5793 Unspecified mononeuropathy of bilateral lower limbs: Secondary | ICD-10-CM

## 2022-12-10 ENCOUNTER — Other Ambulatory Visit: Payer: Medicare Other

## 2022-12-10 ENCOUNTER — Ambulatory Visit: Payer: Medicare Other | Admitting: Oncology

## 2022-12-11 NOTE — Telephone Encounter (Signed)
Requested Prescriptions  Pending Prescriptions Disp Refills   gabapentin (NEURONTIN) 300 MG capsule [Pharmacy Med Name: Gabapentin 300 MG Oral Capsule] 540 capsule 0    Sig: TAKE 3 CAPSULES BY MOUTH TWICE  DAILY     Neurology: Anticonvulsants - gabapentin Failed - 12/09/2022 10:10 PM      Failed - Cr in normal range and within 360 days    Creatinine  Date Value Ref Range Status  02/06/2012 0.76 0.60 - 1.30 mg/dL Final   Creatinine, Ser  Date Value Ref Range Status  11/05/2022 1.01 (H) 0.57 - 1.00 mg/dL Final         Passed - Completed PHQ-2 or PHQ-9 in the last 360 days      Passed - Valid encounter within last 12 months    Recent Outpatient Visits           1 month ago Aortic atherosclerosis ([place])   [place], NP   5 months ago Sinus congestion   [facility] Kathrine Haddock, NP   9 months ago Chronic intractable headache, unspecified headache type   [facility] Vigg, Avanti, MD   9 months ago DNR (do not resuscitate)   [facility] Vigg, Avanti, MD   10 months ago Encounter to establish care   [employer], MD       Future Appointments             In 1 month Jon Billings, NP [facility], [place]

## 2022-12-13 ENCOUNTER — Telehealth: Payer: Self-pay | Admitting: Oncology

## 2022-12-13 ENCOUNTER — Inpatient Hospital Stay (HOSPITAL_BASED_OUTPATIENT_CLINIC_OR_DEPARTMENT_OTHER): Payer: Medicare Other | Admitting: Oncology

## 2022-12-13 ENCOUNTER — Inpatient Hospital Stay: Payer: Medicare Other | Attending: Oncology

## 2022-12-13 ENCOUNTER — Encounter: Payer: Self-pay | Admitting: Oncology

## 2022-12-13 VITALS — BP 148/65 | HR 65 | Temp 98.4°F | Resp 16 | Wt 189.0 lb

## 2022-12-13 DIAGNOSIS — M25561 Pain in right knee: Secondary | ICD-10-CM | POA: Insufficient documentation

## 2022-12-13 DIAGNOSIS — C50919 Malignant neoplasm of unspecified site of unspecified female breast: Secondary | ICD-10-CM

## 2022-12-13 DIAGNOSIS — Z170 Estrogen receptor positive status [ER+]: Secondary | ICD-10-CM | POA: Diagnosis not present

## 2022-12-13 DIAGNOSIS — G8929 Other chronic pain: Secondary | ICD-10-CM | POA: Insufficient documentation

## 2022-12-13 DIAGNOSIS — C7951 Secondary malignant neoplasm of bone: Secondary | ICD-10-CM | POA: Diagnosis not present

## 2022-12-13 DIAGNOSIS — M810 Age-related osteoporosis without current pathological fracture: Secondary | ICD-10-CM

## 2022-12-13 DIAGNOSIS — M25562 Pain in left knee: Secondary | ICD-10-CM | POA: Insufficient documentation

## 2022-12-13 DIAGNOSIS — Z79899 Other long term (current) drug therapy: Secondary | ICD-10-CM | POA: Insufficient documentation

## 2022-12-13 DIAGNOSIS — C50912 Malignant neoplasm of unspecified site of left female breast: Secondary | ICD-10-CM | POA: Diagnosis not present

## 2022-12-13 DIAGNOSIS — Z79811 Long term (current) use of aromatase inhibitors: Secondary | ICD-10-CM | POA: Insufficient documentation

## 2022-12-13 LAB — CBC WITH DIFFERENTIAL/PLATELET
Abs Immature Granulocytes: 0.03 10*3/uL (ref 0.00–0.07)
Basophils Absolute: 0.1 10*3/uL (ref 0.0–0.1)
Basophils Relative: 1 %
Eosinophils Absolute: 0.4 10*3/uL (ref 0.0–0.5)
Eosinophils Relative: 6 %
HCT: 36.9 % (ref 36.0–46.0)
Hemoglobin: 12.2 g/dL (ref 12.0–15.0)
Immature Granulocytes: 0 %
Lymphocytes Relative: 20 %
Lymphs Abs: 1.4 10*3/uL (ref 0.7–4.0)
MCH: 29.2 pg (ref 26.0–34.0)
MCHC: 33.1 g/dL (ref 30.0–36.0)
MCV: 88.3 fL (ref 80.0–100.0)
Monocytes Absolute: 0.5 10*3/uL (ref 0.1–1.0)
Monocytes Relative: 8 %
Neutro Abs: 4.5 10*3/uL (ref 1.7–7.7)
Neutrophils Relative %: 65 %
Platelets: 184 10*3/uL (ref 150–400)
RBC: 4.18 MIL/uL (ref 3.87–5.11)
RDW: 15.1 % (ref 11.5–15.5)
WBC: 6.9 10*3/uL (ref 4.0–10.5)
nRBC: 0 % (ref 0.0–0.2)

## 2022-12-13 LAB — COMPREHENSIVE METABOLIC PANEL
ALT: 12 U/L (ref 0–44)
AST: 20 U/L (ref 15–41)
Albumin: 4 g/dL (ref 3.5–5.0)
Alkaline Phosphatase: 72 U/L (ref 38–126)
Anion gap: 10 (ref 5–15)
BUN: 41 mg/dL — ABNORMAL HIGH (ref 8–23)
CO2: 28 mmol/L (ref 22–32)
Calcium: 8.7 mg/dL — ABNORMAL LOW (ref 8.9–10.3)
Chloride: 102 mmol/L (ref 98–111)
Creatinine, Ser: 1.06 mg/dL — ABNORMAL HIGH (ref 0.44–1.00)
GFR, Estimated: 49 mL/min — ABNORMAL LOW (ref >60)
Glucose, Bld: 102 mg/dL — ABNORMAL HIGH (ref 70–99)
Potassium: 3.6 mmol/L (ref 3.5–5.1)
Sodium: 140 mmol/L (ref 135–145)
Total Bilirubin: 0.6 mg/dL (ref 0.3–1.2)
Total Protein: 7.5 g/dL (ref 6.5–8.1)

## 2022-12-13 MED ORDER — TAMOXIFEN CITRATE 20 MG PO TABS: 20.0000 mg | ORAL_TABLET | Freq: Every day | ORAL | 3 refills | Status: DC

## 2022-12-13 NOTE — Telephone Encounter (Signed)
Pt is having trouble getting into mychart and would like for her lab results to be given to her during visit today and in future. Sent Via Waldo JS,Rao,SV

## 2022-12-13 NOTE — Progress Notes (Unsigned)
Pt in for 4 mpnth follow up, denies any concerns today.

## 2022-12-14 LAB — CANCER ANTIGEN 27.29: CA 27.29: 16 U/mL (ref 0.0–38.6)

## 2022-12-15 ENCOUNTER — Encounter: Payer: Self-pay | Admitting: Hematology and Oncology

## 2022-12-15 NOTE — Progress Notes (Signed)
Hematology/Oncology Consult note [facility]  [phone] [phone]  Patient Care Team: Jon Billings, NP as PCP - General (Nurse Practitioner) [name], MD as Consulting Physician (Cardiology) Renata Caprice as Physician Assistant (Orthopedic Surgery) Bary Castilla Forest Gleason, MD (General Surgery) Hessie Knows, MD as Consulting Physician (Orthopedic Surgery) Sindy Guadeloupe, MD as Consulting Physician (Hematology and Oncology)   Name of the patient: Kelli Williams  [id number]  Oct 03, 1926   Date of visit: 12/15/22  Diagnosis-  metastatic ER positive breast cancer   Chief complaint/ Reason for visit- routine f/u of breast cancer  Heme/Onc history:  Patient is a 89 year old female who was previously seeing Dr. Mike Gip and is now transferring her care to me.  She was diagnosed with left breast cancer in August 2000 and NTU.  It was a 0.8 cm grade 2 invasive mammary carcinoma ER 50% positive PR 90% positive.  She underwent adjuvant radiation treatment and completed 5 years of tamoxifen in July 2005.  She was noted to have metastatic breast cancer on left axillary biopsy in August 2018.  Pathology showed 1.1 cm grade 2 invasive mammary carcinoma with focal lobular features.  1 out of 4 axillary lymph nodes with extracapsular extension.  Tumor greater than 90% ER/PR positive and HER2 negative by FISH.  PET scan also showed widespread hypermetabolic osseous bone lesions she has been on Faslodex since August 2018 with stable disease and most recent PET scan in December 2021 showed no evidence of recurrence or progression.  Patient was also on Xgeva previously which was given between October 2018 but stopped in May 2019 due to dental issues   Patient was switched from Faslodex to letrozole in July 2022    Interval history-patient has chronic bilateral knee pain as well as occasional low back pain.  She has not had any recent falls.   She continues to take letrozole without any significant side effects.  ECOG PS- 2 Pain scale- 3 Opioid associated constipation- no  Review of systems- Review of Systems  Constitutional:  Negative for chills, fever, malaise/fatigue and weight loss.  HENT:  Negative for congestion, ear discharge and nosebleeds.   Eyes:  Negative for blurred vision.  Respiratory:  Negative for cough, hemoptysis, sputum production, shortness of breath and wheezing.   Cardiovascular:  Negative for chest pain, palpitations, orthopnea and claudication.  Gastrointestinal:  Negative for abdominal pain, blood in stool, constipation, diarrhea, heartburn, melena, nausea and vomiting.  Genitourinary:  Negative for dysuria, flank pain, frequency, hematuria and urgency.  Musculoskeletal:  Positive for joint pain. Negative for back pain and myalgias.  Skin:  Negative for rash.  Neurological:  Negative for dizziness, tingling, focal weakness, seizures, weakness and headaches.  Endo/Heme/Allergies:  Does not bruise/bleed easily.  Psychiatric/Behavioral:  Negative for depression and suicidal ideas. The patient does not have insomnia.       Allergies  Allergen Reactions   Amitriptyline Hives   Amoxicillin     Diarrhea     Black Pepper [[place]]    Ivp Dye [Iodinated Contrast Media]    Tape Other (See Comments)    Whelps - please use paper tape.   Silicone Rash    Whelps - please use paper tape.   Sulfa Antibiotics Rash     Past Medical History:  Diagnosis Date   Anxiety    Arthritis    BOTH FEET AND KNEES   Breast cancer ([place]) 2000   left breast ca with lumpectomy and  rad tx 5 yr tamoxifen/Dr Jeannine Kitten [facility]   Complication of anesthesia    WOKE UP DURING SURGERY FOR DEVIATED SEPTUM, KNEE REPLACEMENT AND BREAST LUMPECTOMY   Dysrhythmia    GERD (gastroesophageal reflux disease)    Hypertension    Neuropathic pain of both legs    Neuropathy    Personal history of chemotherapy    Personal history of  radiation therapy    Pneumonia 2016   Skin cancer of forehead    Skin cancer of nose      Past Surgical History:  Procedure Laterality Date   ABDOMINAL HYSTERECTOMY     AXILLARY LYMPH NODE BIOPSY Left 07/10/2017   INVASIVE MAMMARY CARCINOMA WITH FOCAL LOBULAR FEATURES.    BREAST BIOPSY Bilateral 1960   benign   BREAST LUMPECTOMY Left 2000   breast ca with rad tx   BREAST LUMPECTOMY Left 08/20/2017   BREAST LUMPECTOMY Left 08/20/2017   Procedure: left breast wide excision;  Surgeon: Robert Bellow, MD;  Location: [facility] ORS;  Service: General;  Laterality: Left;   CATARACT EXTRACTION     left eye   CHOLECYSTECTOMY     JOINT REPLACEMENT     NASAL SEPTUM SURGERY     PARTIAL KNEE ARTHROPLASTY     right   TOTAL VAGINAL HYSTERECTOMY      Social History   Socioeconomic History   Marital status: Widowed    Spouse name: Not on file   Number of children: 3   Years of education: some college   Highest education level: 12th grade  Occupational History   Occupation: Retired  Tobacco Use   Smoking status: Never   Smokeless tobacco: Never   Tobacco comments:    smoking cessation materials not required  Vaping Use   Vaping Use: Never used  Substance and Sexual Activity   Alcohol use: No    Alcohol/week: 0.0 standard drinks of alcohol   Drug use: No   Sexual activity: Not Currently  Other Topics Concern   Not on file  Social History Narrative   Patient lives in independent living at [facility] Strain: Low Risk  (10/22/2022)   Overall Financial Resource Strain (CARDIA)    Difficulty of Paying Living Expenses: Not hard at all  Food Insecurity: No Food Insecurity (10/22/2022)   Hunger Vital Sign    Worried About Running Out of Food in the Last Year: Never true    [place] in the Last Year: Never true  Transportation Needs: No Transportation Needs (10/22/2022)   PRAPARE - Radiographer, therapeutic (Medical): No    Lack of Transportation (Non-Medical): No  Physical Activity: Inactive (10/22/2022)   Exercise Vital Sign    Days of Exercise per Week: 0 days    Minutes of Exercise per Session: 0 min  Stress: No Stress Concern Present (10/22/2022)   [place]    Feeling of Stress : Not at all  Social Connections: Socially Isolated (10/22/2022)   Social Connection and Isolation Panel [NHANES]    Frequency of Communication with Friends and Family: More than three times a week    Frequency of Social Gatherings with Friends and Family: Once a week    Attends Religious Services: Never    Marine scientist or Organizations: No    Attends Archivist Meetings: Never    Marital  Status: Widowed  Intimate Partner Violence: Not At Risk (10/22/2022)   Humiliation, Afraid, Rape, and Kick questionnaire    Fear of Current or Ex-Partner: No    Emotionally Abused: No    Physically Abused: No    Sexually Abused: No    Family History  Problem Relation Age of Onset   Heart disease Mother    Heart attack Paternal Uncle    Breast cancer Daughter 65       genetic negative   Cancer Paternal Grandmother      Current Outpatient Medications:    acetaminophen (TYLENOL) 650 MG CR tablet, Take by mouth., Disp: , Rfl:    aspirin 81 MG tablet, Take 1 tablet by mouth daily., Disp: , Rfl:    doxazosin (CARDURA) 1 MG tablet, Take 1 tablet (1 mg total) by mouth 2 (two) times daily., Disp: 180 tablet, Rfl: 3   ezetimibe (ZETIA) 10 MG tablet, Take 1 tablet (10 mg total) by mouth daily., Disp: 90 tablet, Rfl: 3   gabapentin (NEURONTIN) 300 MG capsule, TAKE 3 CAPSULES BY MOUTH TWICE  DAILY, Disp: 540 capsule, Rfl: 0   letrozole (FEMARA) 2.5 MG tablet, TAKE 1 TABLET BY MOUTH DAILY, Disp: 100 tablet, Rfl: 2   lisinopril (ZESTRIL) 40 MG tablet, Take 1 tablet (40 mg total) by mouth daily. Take 1 tablet (40 mg) by mouth  once daily, Disp: 90 tablet, Rfl: 3   meloxicam (MOBIC) 15 MG tablet, TAKE 1 TABLET BY MOUTH DAILY, Disp: 90 tablet, Rfl: 3   Multiple Vitamins-Minerals (MULTIVITAMIN WITH MINERALS) tablet, Take 1 tablet by mouth daily., Disp: , Rfl:    omeprazole (PRILOSEC) 20 MG capsule, Take 1 capsule (20 mg total) by mouth daily., Disp: 90 capsule, Rfl: 1   tamoxifen (NOLVADEX) 20 MG tablet, Take 1 tablet (20 mg total) by mouth daily., Disp: 30 tablet, Rfl: 3  Physical exam:  Vitals:   12/13/22 1316  BP: (!) 148/65  Pulse: 65  Resp: 16  Temp: 98.4 F (36.9 C)  TempSrc: Tympanic  Weight: 189 lb (85.7 kg)   Physical Exam Constitutional:      Comments: Elderly woman who ambulates with a walker.  Appears in no acute distress  Cardiovascular:     Rate and Rhythm: Normal rate and regular rhythm.     Heart sounds: Normal heart sounds.  Pulmonary:     Effort: Pulmonary effort is normal.     Breath sounds: Normal breath sounds.  Skin:    General: Skin is warm and dry.  Neurological:     Mental Status: She is alert and oriented to person, place, and time.         Latest Ref Rng & Units 12/13/2022   12:28 PM  CMP  Glucose 70 - 99 mg/dL 102   BUN 8 - 23 mg/dL 41   Creatinine 0.44 - 1.00 mg/dL 1.06   Sodium 135 - 145 mmol/L 140   Potassium 3.5 - 5.1 mmol/L 3.6   Chloride 98 - 111 mmol/L 102   CO2 22 - 32 mmol/L 28   Calcium 8.9 - 10.3 mg/dL 8.7   Total Protein 6.5 - 8.1 g/dL 7.5   Total Bilirubin 0.3 - 1.2 mg/dL 0.6   Alkaline Phos 38 - 126 U/L 72   AST 15 - 41 U/L 20   ALT 0 - 44 U/L 12       Latest Ref Rng & Units 12/13/2022   12:28 PM  CBC  WBC 4.0 - 10.5 K/uL  6.9   Hemoglobin 12.0 - 15.0 g/dL 12.2   Hematocrit 36.0 - 46.0 % 36.9   Platelets 150 - 400 K/uL 184     Assessment and plan- Patient is a 89 y.o. female with metastatic ER positive breast cancer currently on letrozole.  This is a routine follow-up visit  Tumor markers are overall stable.  Patient is currently on single  agent letrozole.  Recent bone density scan does show worsening osteoporosis in her left forearm.  At her age I am particularly concerned about her risk of falls and a potential fracture given her osteoporosis.  I therefore recommend switching her to tamoxifen at this time.  Discussed risks and benefits of tamoxifen including all but not limited to hot flashes, mood swings, arthralgias as well as risk of DVT and cataracts.  Patient understands and agrees to proceed as planned.  It would be desirable for patient to start on weekly Fosamax or yearly Reclast for her osteoporosis.  Patient will need to see a new dentist.  We have given her information about some dentists and she will try to establish follow-up with them to get dental clearance to start Reclast.  I will see her back in 4 months with labs   Visit Diagnosis 1. Carcinoma of breast metastatic to bone, unspecified laterality ([place])   2. Osteoporosis of forearm   3. Use of letrozole (Femara)      Dr. Randa Evens, MD, MPH [facility] [phone] 12/15/2022 8:54 AM

## 2022-12-16 ENCOUNTER — Telehealth: Payer: Self-pay | Admitting: Nurse Practitioner

## 2022-12-16 NOTE — Telephone Encounter (Signed)
I called Kelli Williams, we talked about pc, last visit 07/17/2022, Kelli Williams endorses she no longer wishes to have pc services. Will d/c per request

## 2022-12-26 ENCOUNTER — Ambulatory Visit: Payer: Medicare Other | Admitting: Nurse Practitioner

## 2023-01-08 ENCOUNTER — Telehealth: Payer: Self-pay | Admitting: Nurse Practitioner

## 2023-01-08 ENCOUNTER — Ambulatory Visit (INDEPENDENT_AMBULATORY_CARE_PROVIDER_SITE_OTHER): Payer: Medicare Other | Admitting: Nurse Practitioner

## 2023-01-08 ENCOUNTER — Encounter: Payer: Self-pay | Admitting: Nurse Practitioner

## 2023-01-08 VITALS — BP 128/68 | HR 77 | Temp 99.0°F | Wt 191.0 lb

## 2023-01-08 DIAGNOSIS — M85851 Other specified disorders of bone density and structure, right thigh: Secondary | ICD-10-CM

## 2023-01-08 DIAGNOSIS — I10 Essential (primary) hypertension: Secondary | ICD-10-CM

## 2023-01-08 DIAGNOSIS — I739 Peripheral vascular disease, unspecified: Secondary | ICD-10-CM

## 2023-01-08 DIAGNOSIS — I700 Atherosclerosis of aorta: Secondary | ICD-10-CM

## 2023-01-08 NOTE — Assessment & Plan Note (Signed)
Chronic.  Controlled.  Continue with current medication regimen.  Followed by Cardiology.  Labs reviewed from Oncology appointment. Return to clinic in 3 months for reevaluation.  Call sooner if concerns arise.

## 2023-01-08 NOTE — Progress Notes (Signed)
BP 128/68   Pulse 77   Temp 99 F (37.2 C) (Oral)   Wt 191 lb (86.6 kg)   SpO2 94%   BMI 33.83 kg/m    Subjective:    Patient ID: Kelli Williams, female    DOB: 1926-09-22, 89 y.o.   MRN: [id number]  HPI: Kelli Williams is a 89 y.o. female  Chief Complaint  Patient presents with   Hypertension   Gastroesophageal Reflux    Pt states she would like to discuss the side effects of Omeprazole   HYPERTENSION / HYPERLIPIDEMIA Satisfied with current treatment? yes Duration of hypertension: years BP monitoring frequency: not checking BP range:  BP medication side effects: no Past BP meds: lisinopril Duration of hyperlipidemia: years Cholesterol medication side effects: no Cholesterol supplements: none Past cholesterol medications: ezetimide (zetia) Medication compliance: excellent compliance Aspirin: no Recent stressors: no Recurrent headaches: no Visual changes: no Palpitations: no Dyspnea: no Chest pain: no Lower extremity edema: no Dizzy/lightheaded: no  Patient was taken off the Femara and put on Tamoxifen due to osteoporosis.  She was on Tamoxifen after her initial lumpectomy in 2000 but completed the treatment.    She was previously on Denosumab for osteoporosis.  Oncology wants her to see the dentist before prescribing another medication for osteoporosis.     Relevant past medical, surgical, family and social history reviewed and updated as indicated. Interim medical history since our last visit reviewed. Allergies and medications reviewed and updated.  Review of Systems  Eyes:  Negative for visual disturbance.  Respiratory:  Negative for cough, chest tightness and shortness of breath.   Cardiovascular:  Negative for chest pain, palpitations and leg swelling.  Neurological:  Negative for dizziness and headaches.    Per HPI unless specifically indicated above     Objective:    BP 128/68   Pulse 77   Temp 99 F (37.2 C) (Oral)   Wt 191 lb (86.6  kg)   SpO2 94%   BMI 33.83 kg/m   Wt Readings from Last 3 Encounters:  01/08/23 191 lb (86.6 kg)  12/13/22 189 lb (85.7 kg)  11/05/22 186 lb 6.4 oz (84.6 kg)    Physical Exam Vitals and nursing note reviewed.  Constitutional:      General: She is not in acute distress.    Appearance: Normal appearance. She is normal weight. She is not ill-appearing, toxic-appearing or diaphoretic.  HENT:     Head: Normocephalic.     Right Ear: External ear normal.     Left Ear: External ear normal.     Nose: Nose normal.     Mouth/Throat:     Mouth: Mucous membranes are moist.     Pharynx: Oropharynx is clear.  Eyes:     General:        Right eye: No discharge.        Left eye: No discharge.     Extraocular Movements: Extraocular movements intact.     Conjunctiva/sclera: Conjunctivae normal.     Pupils: Pupils are equal, round, and reactive to light.  Cardiovascular:     Rate and Rhythm: Normal rate and regular rhythm.     Heart sounds: No murmur heard. Pulmonary:     Effort: Pulmonary effort is normal. No respiratory distress.     Breath sounds: Normal breath sounds. No wheezing or rales.  Musculoskeletal:     Cervical back: Normal range of motion and neck supple.  Skin:    General: Skin is warm  and dry.     Capillary Refill: Capillary refill takes less than 2 seconds.  Neurological:     General: No focal deficit present.     Mental Status: She is alert and oriented to person, place, and time. Mental status is at baseline.  Psychiatric:        Mood and Affect: Mood normal.        Behavior: Behavior normal.        Thought Content: Thought content normal.        Judgment: Judgment normal.     Results for orders placed or performed in visit on 12/13/22  Cancer antigen 27.29  Result Value Ref Range   CA 27.29 16.0 0.0 - 38.6 U/mL  Comprehensive metabolic panel  Result Value Ref Range   Sodium 140 135 - 145 mmol/L   Potassium 3.6 3.5 - 5.1 mmol/L   Chloride 102 98 - 111 mmol/L    CO2 28 22 - 32 mmol/L   Glucose, Bld 102 (H) 70 - 99 mg/dL   BUN 41 (H) 8 - 23 mg/dL   Creatinine, Ser 1.06 (H) 0.44 - 1.00 mg/dL   Calcium 8.7 (L) 8.9 - 10.3 mg/dL   Total Protein 7.5 6.5 - 8.1 g/dL   Albumin 4.0 3.5 - 5.0 g/dL   AST 20 15 - 41 U/L   ALT 12 0 - 44 U/L   Alkaline Phosphatase 72 38 - 126 U/L   Total Bilirubin 0.6 0.3 - 1.2 mg/dL   GFR, Estimated 49 (L) >60 mL/min   Anion gap 10 5 - 15  CBC with Differential  Result Value Ref Range   WBC 6.9 4.0 - 10.5 K/uL   RBC 4.18 3.87 - 5.11 MIL/uL   Hemoglobin 12.2 12.0 - 15.0 g/dL   HCT 36.9 36.0 - 46.0 %   MCV 88.3 80.0 - 100.0 fL   MCH 29.2 26.0 - 34.0 pg   MCHC 33.1 30.0 - 36.0 g/dL   RDW 15.1 11.5 - 15.5 %   Platelets 184 150 - 400 K/uL   nRBC 0.0 0.0 - 0.2 %   Neutrophils Relative % 65 %   Neutro Abs 4.5 1.7 - 7.7 K/uL   Lymphocytes Relative 20 %   Lymphs Abs 1.4 0.7 - 4.0 K/uL   Monocytes Relative 8 %   Monocytes Absolute 0.5 0.1 - 1.0 K/uL   Eosinophils Relative 6 %   Eosinophils Absolute 0.4 0.0 - 0.5 K/uL   Basophils Relative 1 %   Basophils Absolute 0.1 0.0 - 0.1 K/uL   Immature Granulocytes 0 %   Abs Immature Granulocytes 0.03 0.00 - 0.07 K/uL      Assessment & Plan:   Problem List Items Addressed This Visit       Cardiovascular and Mediastinum   Essential hypertension    Chronic.  Controlled.  Continue with current medication regimen of Lisinopril '40mg'$ .  Labs reviewed from Oncology visit. Return to clinic in 3 months for reevaluation.  Call sooner if concerns arise.        PAD (peripheral artery disease) ([facility])    Chronic.  Controlled.  Continue with current medication regimen.  Followed by Cardiology.  Labs reviewed from Oncology appointment. Return to clinic in 3 months for reevaluation.  Call sooner if concerns arise.        Aortic atherosclerosis ([facility]) - Primary    Chronic.  Controlled.  Continue with current medication regimen of Zetia.  Labs reviewed from Oncology appointment.  Return  to clinic in 3 months for reevaluation.  Call sooner if concerns arise.          Musculoskeletal and Integument   Osteopenia    Unclear whether Oncologist would like for Fosamax to be written by this office. Will reach out to Oncology for clarification.  Patient has agreed to start medication.  Discussed the need to see dentist during visit today.          Follow up plan: Return in about 3 months (around 04/08/2023) for HTN, HLD, DM2 FU.

## 2023-01-08 NOTE — Telephone Encounter (Signed)
Can we please call Dr. Elroy Channel office to see if she is expecting me to prescribe the Fosamax? If so, the patient has agreed and I will send it in.

## 2023-01-08 NOTE — Assessment & Plan Note (Addendum)
Chronic.  Controlled.  Continue with current medication regimen of Zetia.  Labs reviewed from Oncology appointment.  Return to clinic in 3 months for reevaluation.  Call sooner if concerns arise.

## 2023-01-08 NOTE — Assessment & Plan Note (Addendum)
Unclear whether Oncologist would like for Fosamax to be written by this office. Will reach out to Oncology for clarification.  Patient has agreed to start medication.  Discussed the need to see dentist during visit today.

## 2023-01-08 NOTE — Assessment & Plan Note (Addendum)
Chronic.  Controlled.  Continue with current medication regimen of Lisinopril '40mg'$ .  Labs reviewed from Oncology visit. Return to clinic in 3 months for reevaluation.  Call sooner if concerns arise.

## 2023-01-09 NOTE — Telephone Encounter (Signed)
Hassan Rowan from the cancer center returned my call. Based on Dr. Elroy Channel office note from 12/13/22, the patient is supposed to be getting a dental evaluation before proceeding with medication.

## 2023-01-09 NOTE — Telephone Encounter (Signed)
Called and LVM at Dr. Elroy Channel office asking for a return call to discuss.

## 2023-01-09 NOTE — Telephone Encounter (Signed)
Patient notified of providers message. 

## 2023-01-09 NOTE — Telephone Encounter (Signed)
Please let patient know that we spoke with Dr. Elroy Channel office and they would like her to have a dental evaluation before starting any therapy for her osteoporosis.

## 2023-01-22 ENCOUNTER — Telehealth: Payer: Self-pay | Admitting: Nurse Practitioner

## 2023-01-22 ENCOUNTER — Telehealth: Payer: Self-pay | Admitting: *Deleted

## 2023-01-22 NOTE — Telephone Encounter (Signed)
Looks it has been discontinued. So nothing to do from our side

## 2023-01-22 NOTE — Telephone Encounter (Signed)
Patient  called mentioning that she had gotten an bill from [facility] for a large amount and that she did not get a satisfactory answer from them when she called them. She then asked them who had referred her and was told that we and Ivonne Andrew at [facility] had ordered it. She said she had not been informed of this. She said that she  wants to make it clear that she does NOT want [facility] to come to her home.

## 2023-01-22 NOTE — Telephone Encounter (Signed)
Copied from [place] [id number]. Topic: General - Inquiry >> Jan 22, 2023  2:26 PM Ludger Nutting wrote: Patient states that she received a bill from [employer] for services that Jon Billings and Dr Janese Banks ordered. Patient states that she did not want these services and told the nurse from [place] that when she showed up at her house the first time. Patient states that she received a bill for days that nurse wasn't there. Please follow up with patient.

## 2023-01-28 NOTE — Telephone Encounter (Signed)
Order was placed by palliative care NP Josh Borders.

## 2023-01-28 NOTE — Telephone Encounter (Signed)
Contacted patient and informed her that orders for palliative care were placed by Billey Chang, NP.  Patient acknowledged understanding.

## 2023-02-05 ENCOUNTER — Ambulatory Visit: Payer: Medicare Other | Admitting: Nurse Practitioner

## 2023-02-15 ENCOUNTER — Other Ambulatory Visit: Payer: Self-pay | Admitting: Internal Medicine

## 2023-02-15 ENCOUNTER — Other Ambulatory Visit: Payer: Self-pay | Admitting: Nurse Practitioner

## 2023-02-15 DIAGNOSIS — K589 Irritable bowel syndrome without diarrhea: Secondary | ICD-10-CM

## 2023-02-15 DIAGNOSIS — G5793 Unspecified mononeuropathy of bilateral lower limbs: Secondary | ICD-10-CM

## 2023-02-17 NOTE — Telephone Encounter (Signed)
Last RF 11/05/22 #90 1 RF  Requested Prescriptions  Refused Prescriptions Disp Refills   omeprazole (PRILOSEC) 20 MG capsule [Pharmacy Med Name: Omeprazole 20 MG Oral Capsule Delayed Release] 100 capsule 2    Sig: TAKE 1 CAPSULE BY MOUTH DAILY     Gastroenterology: Proton Pump Inhibitors Passed - 02/15/2023 11:05 PM      Passed - Valid encounter within last 12 months    Recent Outpatient Visits           1 month ago Aortic atherosclerosis ([place])   [place] Jon Billings, NP   3 months ago Aortic atherosclerosis [facility])   [place] Jon Billings, NP   7 months ago Sinus congestion   [place] Kathrine Haddock, NP   11 months ago Chronic intractable headache, unspecified headache type   [place] Vigg, Avanti, MD   11 months ago DNR (do not resuscitate)   [place] Vigg, Avanti, MD       Future Appointments             In 1 month Jon Billings, NP [place], [facility]

## 2023-02-19 ENCOUNTER — Other Ambulatory Visit: Payer: Self-pay | Admitting: Internal Medicine

## 2023-02-19 ENCOUNTER — Encounter: Payer: Self-pay | Admitting: Nurse Practitioner

## 2023-02-19 DIAGNOSIS — K589 Irritable bowel syndrome without diarrhea: Secondary | ICD-10-CM

## 2023-02-19 DIAGNOSIS — G5793 Unspecified mononeuropathy of bilateral lower limbs: Secondary | ICD-10-CM

## 2023-02-20 ENCOUNTER — Other Ambulatory Visit: Payer: Self-pay | Admitting: Oncology

## 2023-02-20 ENCOUNTER — Telehealth: Payer: Self-pay | Admitting: Cardiovascular Disease

## 2023-02-20 MED ORDER — GABAPENTIN 300 MG PO CAPS: 900.0000 mg | ORAL_CAPSULE | Freq: Two times a day (BID) | ORAL | 1 refills | Status: DC

## 2023-02-20 MED ORDER — OMEPRAZOLE 20 MG PO CPDR: 20.0000 mg | DELAYED_RELEASE_CAPSULE | Freq: Every day | ORAL | 1 refills | Status: DC

## 2023-02-20 NOTE — Telephone Encounter (Signed)
Patient c/o Palpitations:  High priority if patient c/o lightheadedness, shortness of breath, or chest pain  How long have you had palpitations/irregular HR/ Afib? Are you having the symptoms now? Several months, no. Intermittently   Are you currently experiencing lightheadedness, SOB or CP? no  Do you have a history of afib (atrial fibrillation) or irregular heart rhythm? Yes, irregular heart rhythm  Have you checked your BP or HR? (document readings if available): no  Are you experiencing any other symptoms? No.

## 2023-02-21 ENCOUNTER — Encounter: Payer: Self-pay | Admitting: Hematology and Oncology

## 2023-02-21 NOTE — Telephone Encounter (Signed)
Spoke with patient and she reports having PET scan due to history of metastatic cancer. In that test they noted carotid arthrosclerosis and she had concerns. Discussed at length what that means and she inquired about treatment options. Offered her appointment to see one of our APP's and then also scheduled appointment with Dr. Rockey Situ later than that. She was appreciative for the call back with no further questions at this time.

## 2023-03-04 ENCOUNTER — Telehealth: Payer: Medicare Other | Admitting: Physician Assistant

## 2023-03-04 DIAGNOSIS — B9689 Other specified bacterial agents as the cause of diseases classified elsewhere: Secondary | ICD-10-CM

## 2023-03-04 DIAGNOSIS — J019 Acute sinusitis, unspecified: Secondary | ICD-10-CM | POA: Diagnosis not present

## 2023-03-04 MED ORDER — DOXYCYCLINE HYCLATE 100 MG PO TABS: 100.0000 mg | ORAL_TABLET | Freq: Two times a day (BID) | ORAL | 0 refills | Status: DC

## 2023-03-04 NOTE — Progress Notes (Signed)
I have spent 5 minutes in review of e-visit questionnaire, review and updating patient chart, medical decision making and response to patient.   Ayriel Texidor Cody Davin Muramoto, PA-C    

## 2023-03-04 NOTE — Progress Notes (Signed)

## 2023-03-24 NOTE — Progress Notes (Unsigned)
Cardiology Office Note:   Date:  03/27/2023  ID:  KAZLYNN HIBBERD, DOB 12-22-1925, MRN [id number]  History of Present Illness:   Kelli Williams is a 89 y.o. female with past medical history of mild aortic atherosclerosis, s/p radiation treatment for breast cancer in 2000 on the left with 7 weeks of radiation on a daily basis, tachycardia, palpitations, peripheral edema, anxiety, gastroesophageal reflux disease, hypertension, neuropathy, who is here today with questions related to findings on recent PET scan.  She was last seen in clinic 09/03/2022 by Dr. Mariah Milling.  At that time she had complaints of shortness of breath with little exertion.  There were no medication changes that were made no further testing that was ordered.  She returns to clinic today after having a recent PET scan and then noted a new finding of a left carotid atherosclerosis.She has several questions today around this new finding. She states that she occasional has some dizziness but denies any falls. She has continued swelling to her bilateral lower extremities. Continues to walk with a with a rollator. She occasionally has palpitations but she is not bothered by them. She denies any hospitalizations or visits to the emergency department.   ROS: 10 point review of systems is considered negative with exception of what is listed in the HPI  Studies Reviewed:    EKG: No new tracings were completed today  TTE 03/27/22 1. Left ventricular ejection fraction, by estimation, is 50 to 55%. The  left ventricle has low normal function. The left ventricle demonstrates  regional wall motion abnormalities (see scoring diagram/findings for  description). Left ventricular diastolic   parameters are consistent with Grade I diastolic dysfunction (impaired  relaxation). There is mild hypokinesis of the left ventricular, mid-apical  anteroseptal wall.   2. Right ventricular systolic function is normal. The right ventricular  size is normal.    3. Right atrial size was mildly dilated.   4. The mitral valve is normal in structure. Mild mitral valve  regurgitation.   5. The aortic valve is tricuspid. Aortic valve regurgitation is not  visualized.   6. The inferior vena cava is normal in size with greater than 50%  respiratory variability, suggesting right atrial pressure of 3 mmHg.   Risk Assessment/Calculations:              Physical Exam:   VS:  BP 132/70   Pulse 73   Wt 195 lb (88.5 kg)   SpO2 96%   BMI 34.54 kg/m    Wt Readings from Last 3 Encounters:  03/27/23 195 lb (88.5 kg)  01/08/23 191 lb (86.6 kg)  12/13/22 189 lb (85.7 kg)     GEN: Well nourished, well developed in no acute distress NECK: No JVD;  carotid bruit noted on the left CARDIAC: RRR, no murmurs, rubs, gallops RESPIRATORY:  Clear to auscultation without rales, wheezing or rhonchi  ABDOMEN: Soft, non-tender, non-distended EXTREMITIES:  2+ edema to BLE; No deformity   ASSESSMENT AND PLAN:   Essential hypertension with blood pressure 130/70 today.  She is continued on Cardura 1 mg twice daily, lisinopril 40 mg daily.  She is encouraged to continue to monitor her blood pressures at home as well.  Aortic atherosclerosis, coronary calcification seen on CT scan, and left carotid atherosclerosis noted on recent PET scan.  Patient does have a carotid bruit noted on the left not found on the right.  We did discuss completing a carotid duplex if she becomes extremely symptomatic.  At  this time it was an incidental finding on her PET scan so we will defer from ordering any further testing.  On aspirin 81 mg daily and Zetia 10 mg daily.  Hyperlipidemia, she is continued on statin and 10 mg daily.  Also ordering a lipid panel as it has been quite sometime since she had 1 completed.  Peripheral edema/lymphedema which she is unable to wear TED hose.  She has secondary and does not elevate her extremities also.  She is also not interested in compression  pumps.  Breast cancer previously treated with chemotherapy medications and is completed XRT therapy.  History of metastasis to the bone.  She has had multiple scans.  Continues to follow with oncology.  Disposition she will return to see MD/APP in July when scheduled or sooner if needed.        Signed, Ante Arredondo, NP

## 2023-03-27 ENCOUNTER — Encounter: Payer: Self-pay | Admitting: Cardiology

## 2023-03-27 ENCOUNTER — Ambulatory Visit: Payer: Medicare Other | Attending: Cardiology | Admitting: Cardiology

## 2023-03-27 VITALS — BP 132/70 | HR 73 | Wt 195.0 lb

## 2023-03-27 DIAGNOSIS — I2510 Atherosclerotic heart disease of native coronary artery without angina pectoris: Secondary | ICD-10-CM | POA: Diagnosis not present

## 2023-03-27 DIAGNOSIS — I6522 Occlusion and stenosis of left carotid artery: Secondary | ICD-10-CM | POA: Diagnosis not present

## 2023-03-27 DIAGNOSIS — I700 Atherosclerosis of aorta: Secondary | ICD-10-CM | POA: Diagnosis not present

## 2023-03-27 DIAGNOSIS — R600 Localized edema: Secondary | ICD-10-CM | POA: Diagnosis not present

## 2023-03-27 DIAGNOSIS — I10 Essential (primary) hypertension: Secondary | ICD-10-CM | POA: Diagnosis not present

## 2023-03-27 DIAGNOSIS — E782 Mixed hyperlipidemia: Secondary | ICD-10-CM | POA: Diagnosis not present

## 2023-03-27 DIAGNOSIS — C7951 Secondary malignant neoplasm of bone: Secondary | ICD-10-CM

## 2023-03-27 NOTE — Patient Instructions (Signed)
Medication Instructions:   Your physician recommends that you continue on your current medications as directed. Please refer to the Current Medication list given to you today.  *If you need a refill on your cardiac medications before your next appointment, please call your pharmacy*   Lab Work:  No lab work ordered today If you have labs (blood work) drawn today and your tests are completely normal, you will receive your results only by: MyChart Message (if you have MyChart) OR A paper copy in the mail If you have any lab test that is abnormal or we need to change your treatment, we will call you to review the results.   Testing/Procedures:  No testing ordered today   Follow-Up: At [facility], you and your health needs are our priority.  As part of our continuing mission to provide you with exceptional heart care, we have created designated Provider Care Teams.  These Care Teams include your primary Cardiologist (physician) and Advanced Practice Providers (APPs -  Physician Assistants and Nurse Practitioners) who all work together to provide you with the care you need, when you need it.  We recommend signing up for the patient portal called "MyChart".  Sign up information is provided on this After Visit Summary.  MyChart is used to connect with patients for Virtual Visits (Telemedicine).  Patients are able to view lab/test results, encounter notes, upcoming appointments, etc.  Non-urgent messages can be sent to your provider as well.   To learn more about what you can do with MyChart, go to [email or web address].    Your next appointment:     Keep follow up appointment   Provider:   You may see or one of the following Advanced Practice Providers on your designated Care Team:   Nicolasa Ducking, NP Eula Listen, PA-C Cadence Fransico Michael, PA-C Charlsie Quest, NP   Other Instructions  Please have your primary care office draw your labs.

## 2023-04-08 ENCOUNTER — Ambulatory Visit: Payer: Medicare Other | Admitting: Nurse Practitioner

## 2023-04-10 ENCOUNTER — Ambulatory Visit (INDEPENDENT_AMBULATORY_CARE_PROVIDER_SITE_OTHER): Payer: Medicare Other | Admitting: Nurse Practitioner

## 2023-04-10 ENCOUNTER — Encounter: Payer: Self-pay | Admitting: Nurse Practitioner

## 2023-04-10 VITALS — BP 126/84 | HR 69 | Temp 98.8°F | Wt 193.9 lb

## 2023-04-10 DIAGNOSIS — I700 Atherosclerosis of aorta: Secondary | ICD-10-CM

## 2023-04-10 DIAGNOSIS — I739 Peripheral vascular disease, unspecified: Secondary | ICD-10-CM

## 2023-04-10 DIAGNOSIS — I10 Essential (primary) hypertension: Secondary | ICD-10-CM | POA: Diagnosis not present

## 2023-04-10 NOTE — Assessment & Plan Note (Signed)
Chronic.  Controlled.  Continue with current medication regimen of Lisinopril 40mg  and Doxazosin.  Labs ordered at visit today. Return to clinic in 3 months for reevaluation.  Call sooner if concerns arise.

## 2023-04-10 NOTE — Assessment & Plan Note (Signed)
Chronic.  Controlled.  Continue with current medication regimen.  Followed by Cardiology.  Follows with Dr. Raynald Kemp every 6 months.  Labs ordered at visit today.  Return to clinic in 3 months for reevaluation.  Call sooner if concerns arise.

## 2023-04-10 NOTE — Progress Notes (Signed)
BP 126/84 (BP Location: Right Arm, Cuff Size: Normal)   Pulse 69   Temp 98.8 F (37.1 C) (Oral)   Wt 193 lb 14.4 oz (88 kg)   SpO2 97%   BMI 34.35 kg/m    Subjective:    Patient ID: Kelli Williams, female    DOB: [birth year], 89 y.o.   MRN: [id number]  HPI: Kelli Williams is a 89 y.o. female  Chief Complaint  Patient presents with   Hypertension   HYPERTENSION / HYPERLIPIDEMIA Satisfied with current treatment? yes Duration of hypertension: years BP monitoring frequency: not checking BP range:  BP medication side effects: no Past BP meds: lisinopril and doxazosin Duration of hyperlipidemia: years Cholesterol medication side effects: no Cholesterol supplements: none Past cholesterol medications: ezetimide (zetia) Medication compliance: excellent compliance Aspirin: no Recent stressors: no Recurrent headaches: no Visual changes: no Palpitations: no Dyspnea: no Chest pain: no Lower extremity edema: yes Dizzy/lightheaded: no  BREAST CANCER Completed treatment.  Now on tamoxifen.   Relevant past medical, surgical, family and social history reviewed and updated as indicated. Interim medical history since our last visit reviewed. Allergies and medications reviewed and updated.  Review of Systems  Eyes:  Negative for visual disturbance.  Respiratory:  Negative for cough, chest tightness and shortness of breath.   Cardiovascular:  Positive for leg swelling. Negative for chest pain and palpitations.  Neurological:  Negative for dizziness and headaches.    Per HPI unless specifically indicated above     Objective:    BP 126/84 (BP Location: Right Arm, Cuff Size: Normal)   Pulse 69   Temp 98.8 F (37.1 C) (Oral)   Wt 193 lb 14.4 oz (88 kg)   SpO2 97%   BMI 34.35 kg/m   Wt Readings from Last 3 Encounters:  04/10/23 193 lb 14.4 oz (88 kg)  03/27/23 195 lb (88.5 kg)  01/08/23 191 lb (86.6 kg)    Physical Exam Vitals and nursing note reviewed.   Constitutional:      General: She is not in acute distress.    Appearance: Normal appearance. She is normal weight. She is not ill-appearing, toxic-appearing or diaphoretic.  HENT:     Head: Normocephalic.     Right Ear: External ear normal.     Left Ear: External ear normal.     Nose: Nose normal.     Mouth/Throat:     Mouth: Mucous membranes are moist.     Pharynx: Oropharynx is clear.  Eyes:     General:        Right eye: No discharge.        Left eye: No discharge.     Extraocular Movements: Extraocular movements intact.     Conjunctiva/sclera: Conjunctivae normal.     Pupils: Pupils are equal, round, and reactive to light.  Cardiovascular:     Rate and Rhythm: Normal rate and regular rhythm.     Heart sounds: No murmur heard. Pulmonary:     Effort: Pulmonary effort is normal. No respiratory distress.     Breath sounds: Normal breath sounds. No wheezing or rales.  Musculoskeletal:     Cervical back: Normal range of motion and neck supple.     Right lower leg: 2+ Edema present.     Left lower leg: 2+ Edema present.  Skin:    General: Skin is warm and dry.     Capillary Refill: Capillary refill takes less than 2 seconds.  Neurological:     General: No  focal deficit present.     Mental Status: She is alert and oriented to person, place, and time. Mental status is at baseline.  Psychiatric:        Mood and Affect: Mood normal.        Behavior: Behavior normal.        Thought Content: Thought content normal.        Judgment: Judgment normal.     Results for orders placed or performed in visit on 12/13/22  Cancer antigen 27.29  Result Value Ref Range   CA 27.29 16.0 0.0 - 38.6 U/mL  Comprehensive metabolic panel  Result Value Ref Range   Sodium 140 135 - 145 mmol/L   Potassium 3.6 3.5 - 5.1 mmol/L   Chloride 102 98 - 111 mmol/L   CO2 28 22 - 32 mmol/L   Glucose, Bld 102 (H) 70 - 99 mg/dL   BUN 41 (H) 8 - 23 mg/dL   Creatinine, Ser 1.61 (H) 0.44 - 1.00 mg/dL    Calcium 8.7 (L) 8.9 - 10.3 mg/dL   Total Protein 7.5 6.5 - 8.1 g/dL   Albumin 4.0 3.5 - 5.0 g/dL   AST 20 15 - 41 U/L   ALT 12 0 - 44 U/L   Alkaline Phosphatase 72 38 - 126 U/L   Total Bilirubin 0.6 0.3 - 1.2 mg/dL   GFR, Estimated 49 (L) >60 mL/min   Anion gap 10 5 - 15  CBC with Differential  Result Value Ref Range   WBC 6.9 4.0 - 10.5 K/uL   RBC 4.18 3.87 - 5.11 MIL/uL   Hemoglobin 12.2 12.0 - 15.0 g/dL   HCT 09.6 04.5 - 40.9 %   MCV 88.3 80.0 - 100.0 fL   MCH 29.2 26.0 - 34.0 pg   MCHC 33.1 30.0 - 36.0 g/dL   RDW 81.1 91.4 - 78.2 %   Platelets 184 150 - 400 K/uL   nRBC 0.0 0.0 - 0.2 %   Neutrophils Relative % 65 %   Neutro Abs 4.5 1.7 - 7.7 K/uL   Lymphocytes Relative 20 %   Lymphs Abs 1.4 0.7 - 4.0 K/uL   Monocytes Relative 8 %   Monocytes Absolute 0.5 0.1 - 1.0 K/uL   Eosinophils Relative 6 %   Eosinophils Absolute 0.4 0.0 - 0.5 K/uL   Basophils Relative 1 %   Basophils Absolute 0.1 0.0 - 0.1 K/uL   Immature Granulocytes 0 %   Abs Immature Granulocytes 0.03 0.00 - 0.07 K/uL      Assessment & Plan:   Problem List Items Addressed This Visit       Cardiovascular and Mediastinum   Essential hypertension    Chronic.  Controlled.  Continue with current medication regimen of Lisinopril 40mg  and Doxazosin.  Labs ordered at visit today. Return to clinic in 3 months for reevaluation.  Call sooner if concerns arise.        Relevant Orders   Comp Met (CMET)   Lipid Profile   CBC w/Diff   PAD (peripheral artery disease) (HCC) - Primary    Chronic.  Controlled.  Continue with current medication regimen.  Followed by Cardiology.  Follows with Dr. Raynald Kemp every 6 months.  Labs ordered at visit today.  Return to clinic in 3 months for reevaluation.  Call sooner if concerns arise.        Relevant Orders   Comp Met (CMET)   Lipid Profile   CBC w/Diff   Aortic atherosclerosis (HCC)  Chronic.  Controlled.  Continue with current medication regimen of Zetia.  Labs ordered  at visit today.  Return to clinic in 3 months for reevaluation.  Call sooner if concerns arise.        Relevant Orders   Comp Met (CMET)   Lipid Profile   CBC w/Diff     Follow up plan: Return in about 3 months (around 07/11/2023) for HTN, HLD, DM2 FU.

## 2023-04-10 NOTE — Assessment & Plan Note (Signed)
Chronic.  Controlled.  Continue with current medication regimen of Zetia.  Labs ordered at visit today.  Return to clinic in 3 months for reevaluation.  Call sooner if concerns arise.

## 2023-04-11 LAB — CBC WITH DIFFERENTIAL/PLATELET
Basophils Absolute: 0 10*3/uL (ref 0.0–0.2)
Basos: 1 %
EOS (ABSOLUTE): 0.3 10*3/uL (ref 0.0–0.4)
Eos: 5 %
Hematocrit: 37.2 % (ref 34.0–46.6)
Hemoglobin: 12.2 g/dL (ref 11.1–15.9)
Immature Grans (Abs): 0 10*3/uL (ref 0.0–0.1)
Immature Granulocytes: 0 %
Lymphocytes Absolute: 1.3 10*3/uL (ref 0.7–3.1)
Lymphs: 21 %
MCH: 30.1 pg (ref 26.6–33.0)
MCHC: 32.8 g/dL (ref 31.5–35.7)
MCV: 92 fL (ref 79–97)
Monocytes Absolute: 0.4 10*3/uL (ref 0.1–0.9)
Monocytes: 6 %
Neutrophils Absolute: 4 10*3/uL (ref 1.4–7.0)
Neutrophils: 67 %
Platelets: 175 10*3/uL (ref 150–450)
RBC: 4.05 x10E6/uL (ref 3.77–5.28)
RDW: 13.5 % (ref 11.7–15.4)
WBC: 6 10*3/uL (ref 3.4–10.8)

## 2023-04-11 LAB — COMPREHENSIVE METABOLIC PANEL
ALT: 11 IU/L (ref 0–32)
AST: 18 IU/L (ref 0–40)
Albumin/Globulin Ratio: 1.9 (ref 1.2–2.2)
Albumin: 4.3 g/dL (ref 3.6–4.6)
Alkaline Phosphatase: 71 IU/L (ref 44–121)
BUN/Creatinine Ratio: 30 — ABNORMAL HIGH (ref 12–28)
BUN: 24 mg/dL (ref 10–36)
Bilirubin Total: 0.2 mg/dL (ref 0.0–1.2)
CO2: 24 mmol/L (ref 20–29)
Calcium: 9.1 mg/dL (ref 8.7–10.3)
Chloride: 104 mmol/L (ref 96–106)
Creatinine, Ser: 0.81 mg/dL (ref 0.57–1.00)
Globulin, Total: 2.3 g/dL (ref 1.5–4.5)
Glucose: 94 mg/dL (ref 70–99)
Potassium: 4.4 mmol/L (ref 3.5–5.2)
Sodium: 142 mmol/L (ref 134–144)
Total Protein: 6.6 g/dL (ref 6.0–8.5)
eGFR: 67 mL/min/{1.73_m2} (ref >59)

## 2023-04-11 LAB — LIPID PANEL
Chol/HDL Ratio: 2.2 ratio (ref 0.0–4.4)
Cholesterol, Total: 138 mg/dL (ref 100–199)
HDL: 64 mg/dL (ref >39)
LDL Chol Calc (NIH): 53 mg/dL (ref 0–99)
Triglycerides: 118 mg/dL (ref 0–149)
VLDL Cholesterol Cal: 21 mg/dL (ref 5–40)

## 2023-04-11 NOTE — Progress Notes (Signed)
HI [name]. It was nice to see you yesterday.  Your lab work looks good.  No concerns at this time. Continue with your current medication regimen.  Follow up as discussed.  Please let me know if you have any questions.

## 2023-04-18 ENCOUNTER — Inpatient Hospital Stay (HOSPITAL_BASED_OUTPATIENT_CLINIC_OR_DEPARTMENT_OTHER): Payer: Medicare Other | Admitting: Oncology

## 2023-04-18 ENCOUNTER — Inpatient Hospital Stay: Payer: Medicare Other | Attending: Oncology

## 2023-04-18 ENCOUNTER — Encounter: Payer: Self-pay | Admitting: Oncology

## 2023-04-18 VITALS — BP 142/62 | HR 79 | Temp 96.5°F | Resp 18 | Ht 63.0 in | Wt 193.1 lb

## 2023-04-18 DIAGNOSIS — M549 Dorsalgia, unspecified: Secondary | ICD-10-CM | POA: Insufficient documentation

## 2023-04-18 DIAGNOSIS — Z79810 Long term (current) use of selective estrogen receptor modulators (SERMs): Secondary | ICD-10-CM | POA: Insufficient documentation

## 2023-04-18 DIAGNOSIS — Z803 Family history of malignant neoplasm of breast: Secondary | ICD-10-CM | POA: Diagnosis not present

## 2023-04-18 DIAGNOSIS — C50919 Malignant neoplasm of unspecified site of unspecified female breast: Secondary | ICD-10-CM

## 2023-04-18 DIAGNOSIS — Z85820 Personal history of malignant melanoma of skin: Secondary | ICD-10-CM | POA: Diagnosis not present

## 2023-04-18 DIAGNOSIS — Z9221 Personal history of antineoplastic chemotherapy: Secondary | ICD-10-CM | POA: Diagnosis not present

## 2023-04-18 DIAGNOSIS — C50412 Malignant neoplasm of upper-outer quadrant of left female breast: Secondary | ICD-10-CM | POA: Diagnosis not present

## 2023-04-18 DIAGNOSIS — M810 Age-related osteoporosis without current pathological fracture: Secondary | ICD-10-CM | POA: Diagnosis not present

## 2023-04-18 DIAGNOSIS — Z923 Personal history of irradiation: Secondary | ICD-10-CM | POA: Diagnosis not present

## 2023-04-18 DIAGNOSIS — C7951 Secondary malignant neoplasm of bone: Secondary | ICD-10-CM | POA: Insufficient documentation

## 2023-04-18 DIAGNOSIS — Z809 Family history of malignant neoplasm, unspecified: Secondary | ICD-10-CM | POA: Diagnosis not present

## 2023-04-18 DIAGNOSIS — Z79899 Other long term (current) drug therapy: Secondary | ICD-10-CM | POA: Diagnosis not present

## 2023-04-18 DIAGNOSIS — Z170 Estrogen receptor positive status [ER+]: Secondary | ICD-10-CM | POA: Diagnosis not present

## 2023-04-18 DIAGNOSIS — G8929 Other chronic pain: Secondary | ICD-10-CM | POA: Diagnosis not present

## 2023-04-18 DIAGNOSIS — Z5181 Encounter for therapeutic drug level monitoring: Secondary | ICD-10-CM

## 2023-04-18 LAB — CBC WITH DIFFERENTIAL/PLATELET
Abs Immature Granulocytes: 0.03 10*3/uL (ref 0.00–0.07)
Basophils Absolute: 0.1 10*3/uL (ref 0.0–0.1)
Basophils Relative: 1 %
Eosinophils Absolute: 0.3 10*3/uL (ref 0.0–0.5)
Eosinophils Relative: 5 %
HCT: 38.2 % (ref 36.0–46.0)
Hemoglobin: 12.1 g/dL (ref 12.0–15.0)
Immature Granulocytes: 1 %
Lymphocytes Relative: 22 %
Lymphs Abs: 1.4 10*3/uL (ref 0.7–4.0)
MCH: 29.2 pg (ref 26.0–34.0)
MCHC: 31.7 g/dL (ref 30.0–36.0)
MCV: 92.3 fL (ref 80.0–100.0)
Monocytes Absolute: 0.4 10*3/uL (ref 0.1–1.0)
Monocytes Relative: 6 %
Neutro Abs: 4 10*3/uL (ref 1.7–7.7)
Neutrophils Relative %: 65 %
Platelets: 172 10*3/uL (ref 150–400)
RBC: 4.14 MIL/uL (ref 3.87–5.11)
RDW: 14.9 % (ref 11.5–15.5)
WBC: 6.2 10*3/uL (ref 4.0–10.5)
nRBC: 0 % (ref 0.0–0.2)

## 2023-04-18 LAB — COMPREHENSIVE METABOLIC PANEL
ALT: 11 U/L (ref 0–44)
AST: 23 U/L (ref 15–41)
Albumin: 4 g/dL (ref 3.5–5.0)
Alkaline Phosphatase: 54 U/L (ref 38–126)
Anion gap: 10 (ref 5–15)
BUN: 36 mg/dL — ABNORMAL HIGH (ref 8–23)
CO2: 24 mmol/L (ref 22–32)
Calcium: 8.5 mg/dL — ABNORMAL LOW (ref 8.9–10.3)
Chloride: 105 mmol/L (ref 98–111)
Creatinine, Ser: 0.86 mg/dL (ref 0.44–1.00)
GFR, Estimated: >60 mL/min (ref >60)
Glucose, Bld: 103 mg/dL — ABNORMAL HIGH (ref 70–99)
Potassium: 3.7 mmol/L (ref 3.5–5.1)
Sodium: 139 mmol/L (ref 135–145)
Total Bilirubin: 0.4 mg/dL (ref 0.3–1.2)
Total Protein: 7.1 g/dL (ref 6.5–8.1)

## 2023-04-21 LAB — CA 27.29 (SERIAL MONITOR): CA 27.29: 16.3 U/mL (ref 0.0–38.6)

## 2023-04-26 ENCOUNTER — Other Ambulatory Visit: Payer: Self-pay | Admitting: Nurse Practitioner

## 2023-04-26 ENCOUNTER — Encounter: Payer: Self-pay | Admitting: Hematology and Oncology

## 2023-04-26 DIAGNOSIS — K589 Irritable bowel syndrome without diarrhea: Secondary | ICD-10-CM

## 2023-04-26 NOTE — Progress Notes (Signed)
Hematology/Oncology Consult note [facility]  [phone] [phone]  Patient Care Team: Larae Grooms, NP as PCP - General (Nurse Practitioner) Antonieta Iba, MD as Consulting Physician (Cardiology) Ronnette Juniper as Physician Assistant (Orthopedic Surgery) Lemar Livings Merrily Pew, MD (General Surgery) Kennedy Bucker, MD as Consulting Physician (Orthopedic Surgery) Creig Hines, MD as Consulting Physician (Hematology and Oncology)   Name of the patient: Kelli Williams  [id number]  12-Jul-1926   Date of visit: 04/26/23  Diagnosis-  metastatic ER positive breast cancer   Chief complaint/ Reason for visit- routine f/u of breast cancer  Heme/Onc history: Patient is a 89 year old female who was previously seeing Dr. Merlene Pulling and is now transferring her care to me.  She was diagnosed with left breast cancer in August 2000 and NTU.  It was a 0.8 cm grade 2 invasive mammary carcinoma ER 50% positive PR 90% positive.  She underwent adjuvant radiation treatment and completed 5 years of tamoxifen in July 2005.  She was noted to have metastatic breast cancer on left axillary biopsy in August 2018.  Pathology showed 1.1 cm grade 2 invasive mammary carcinoma with focal lobular features.  1 out of 4 axillary lymph nodes with extracapsular extension.  Tumor greater than 90% ER/PR positive and HER2 negative by FISH.  PET scan also showed widespread hypermetabolic osseous bone lesions she has been on Faslodex since August 2018 with stable disease and most recent PET scan in December 2021 showed no evidence of recurrence or progression.  Patient was also on Xgeva previously which was given between October 2018 but stopped in May 2019 due to dental issues   Patient was switched from Faslodex to letrozole in July 2022  Interval history-patient has chronic bilateral lower extremity swelling as well as back pain and knee pain which has remained unchanged.   She is tolerating letrozole well without any significant side effects  ECOG PS- 2-3 Pain scale- 4 Opioid associated constipation- no  Review of systems- Review of Systems  Constitutional:  Positive for malaise/fatigue. Negative for chills, fever and weight loss.  HENT:  Negative for congestion, ear discharge and nosebleeds.   Eyes:  Negative for blurred vision.  Respiratory:  Negative for cough, hemoptysis, sputum production, shortness of breath and wheezing.   Cardiovascular:  Positive for leg swelling. Negative for chest pain, palpitations, orthopnea and claudication.  Gastrointestinal:  Negative for abdominal pain, blood in stool, constipation, diarrhea, heartburn, melena, nausea and vomiting.  Genitourinary:  Negative for dysuria, flank pain, frequency, hematuria and urgency.  Musculoskeletal:  Positive for back pain and joint pain. Negative for myalgias.  Skin:  Negative for rash.  Neurological:  Negative for dizziness, tingling, focal weakness, seizures, weakness and headaches.  Endo/Heme/Allergies:  Does not bruise/bleed easily.  Psychiatric/Behavioral:  Negative for depression and suicidal ideas. The patient does not have insomnia.       Allergies  Allergen Reactions   Amitriptyline Hives   Amoxicillin     Diarrhea     Black Pepper [Piper]    Ivp Dye [Iodinated Contrast Media]    Tape Other (See Comments)    Whelps - please use paper tape.   Silicone Rash    Whelps - please use paper tape.   Sulfa Antibiotics Rash     Past Medical History:  Diagnosis Date   Anxiety    Arthritis    BOTH FEET AND KNEES   Breast cancer (HCC) 2000   left breast ca with lumpectomy  and rad tx 5 yr tamoxifen/Dr Constance Goltz at [facility]   Breast cancer of upper-outer quadrant of left female breast (HCC) 07/10/2017   Complication of anesthesia    WOKE UP DURING SURGERY FOR DEVIATED SEPTUM, KNEE REPLACEMENT AND BREAST LUMPECTOMY   Dysrhythmia    GERD (gastroesophageal reflux disease)     Hypertension    Malignant neoplasm metastatic to bone (HCC) 09/23/2017   Malignant neoplasm metastatic to bone (HCC) 09/23/2017   Neuropathic pain of both legs    Neuropathy    Personal history of chemotherapy    Personal history of radiation therapy    Pneumonia 2016   Skin cancer of forehead    Skin cancer of nose      Past Surgical History:  Procedure Laterality Date   ABDOMINAL HYSTERECTOMY     AXILLARY LYMPH NODE BIOPSY Left 07/10/2017   INVASIVE MAMMARY CARCINOMA WITH FOCAL LOBULAR FEATURES.    BREAST BIOPSY Bilateral 1960   benign   BREAST LUMPECTOMY Left 2000   breast ca with rad tx   BREAST LUMPECTOMY Left 08/20/2017   BREAST LUMPECTOMY Left 08/20/2017   Procedure: left breast wide excision;  Surgeon: Earline Mayotte, MD;  Location: [facility] ORS;  Service: General;  Laterality: Left;   CATARACT EXTRACTION     left eye   CHOLECYSTECTOMY     JOINT REPLACEMENT     NASAL SEPTUM SURGERY     PARTIAL KNEE ARTHROPLASTY     right   TOTAL VAGINAL HYSTERECTOMY      Social History   Socioeconomic History   Marital status: Widowed    Spouse name: Not on file   Number of children: 3   Years of education: some college   Highest education level: 12th grade  Occupational History   Occupation: Retired  Tobacco Use   Smoking status: Never   Smokeless tobacco: Never   Tobacco comments:    smoking cessation materials not required  Vaping Use   Vaping Use: Never used  Substance and Sexual Activity   Alcohol use: No    Alcohol/week: 0.0 standard drinks of alcohol   Drug use: No   Sexual activity: Not Currently  Other Topics Concern   Not on file  Social History Narrative   Patient lives in independent living at [place]       Social Determinants of Health   Financial Resource Strain: Low Risk  (10/22/2022)   Overall Financial Resource Strain (CARDIA)    Difficulty of Paying Living Expenses: Not hard at all  Food Insecurity: No Food Insecurity (10/22/2022)    Hunger Vital Sign    Worried About Running Out of Food in the Last Year: Never true    Ran Out of Food in the Last Year: Never true  Transportation Needs: No Transportation Needs (10/22/2022)   PRAPARE - Administrator, Civil Service (Medical): No    Lack of Transportation (Non-Medical): No  Physical Activity: Inactive (10/22/2022)   Exercise Vital Sign    Days of Exercise per Week: 0 days    Minutes of Exercise per Session: 0 min  Stress: No Stress Concern Present (10/22/2022)   [employer] of Occupational Health - Occupational Stress Questionnaire    Feeling of Stress : Not at all  Social Connections: Socially Isolated (10/22/2022)   Social Connection and Isolation Panel [NHANES]    Frequency of Communication with Friends and Family: More than three times a week    Frequency of Social Gatherings with Friends and Family: Once  a week    Attends Religious Services: Never    Active Member of Clubs or Organizations: No    Attends Banker Meetings: Never    Marital Status: Widowed  Intimate Partner Violence: Not At Risk (10/22/2022)   Humiliation, Afraid, Rape, and Kick questionnaire    Fear of Current or Ex-Partner: No    Emotionally Abused: No    Physically Abused: No    Sexually Abused: No    Family History  Problem Relation Age of Onset   Heart disease Mother    Heart attack Paternal Uncle    Breast cancer Daughter 66       genetic negative   Cancer Paternal Grandmother      Current Outpatient Medications:    acetaminophen (TYLENOL) 650 MG CR tablet, Take by mouth., Disp: , Rfl:    aspirin 81 MG tablet, Take 1 tablet by mouth daily., Disp: , Rfl:    doxazosin (CARDURA) 1 MG tablet, Take 1 tablet (1 mg total) by mouth 2 (two) times daily., Disp: 180 tablet, Rfl: 3   ezetimibe (ZETIA) 10 MG tablet, Take 1 tablet (10 mg total) by mouth daily., Disp: 90 tablet, Rfl: 3   gabapentin (NEURONTIN) 300 MG capsule, Take 3 capsules (900 mg total) by  mouth 2 (two) times daily., Disp: 540 capsule, Rfl: 1   lisinopril (ZESTRIL) 40 MG tablet, Take 1 tablet (40 mg total) by mouth daily. Take 1 tablet (40 mg) by mouth once daily, Disp: 90 tablet, Rfl: 3   meloxicam (MOBIC) 15 MG tablet, TAKE 1 TABLET BY MOUTH DAILY, Disp: 90 tablet, Rfl: 3   Multiple Vitamins-Minerals (MULTIVITAMIN WITH MINERALS) tablet, Take 1 tablet by mouth daily., Disp: , Rfl:    omeprazole (PRILOSEC) 20 MG capsule, Take 1 capsule (20 mg total) by mouth daily., Disp: 90 capsule, Rfl: 1   tamoxifen (NOLVADEX) 20 MG tablet, TAKE 1 TABLET BY MOUTH DAILY, Disp: 100 tablet, Rfl: 2  Physical exam:  Vitals:   04/18/23 1107 04/18/23 1237  BP: (!) 155/66 (!) 142/62  Pulse: 79   Resp: 18   Temp: (!) 96.5 F (35.8 C)   TempSrc: Tympanic   SpO2: 99%   Weight: 193 lb 1.6 oz (87.6 kg)   Height: 5\' 3"  (1.6 m)    Physical Exam Cardiovascular:     Rate and Rhythm: Normal rate and regular rhythm.     Heart sounds: Normal heart sounds.  Pulmonary:     Effort: Pulmonary effort is normal.     Breath sounds: Normal breath sounds.  Abdominal:     General: Bowel sounds are normal.     Palpations: Abdomen is soft.  Musculoskeletal:     Right lower leg: Edema present.     Left lower leg: Edema present.  Skin:    General: Skin is warm and dry.  Neurological:     Mental Status: She is alert and oriented to person, place, and time.         Latest Ref Rng & Units 04/18/2023   10:58 AM  CMP  Glucose 70 - 99 mg/dL 161   BUN 8 - 23 mg/dL 36   Creatinine 0.96 - 1.00 mg/dL 0.45   Sodium 409 - 811 mmol/L 139   Potassium 3.5 - 5.1 mmol/L 3.7   Chloride 98 - 111 mmol/L 105   CO2 22 - 32 mmol/L 24   Calcium 8.9 - 10.3 mg/dL 8.5   Total Protein 6.5 - 8.1 g/dL 7.1  Total Bilirubin 0.3 - 1.2 mg/dL 0.4   Alkaline Phos 38 - 126 U/L 54   AST 15 - 41 U/L 23   ALT 0 - 44 U/L 11       Latest Ref Rng & Units 04/18/2023   10:58 AM  CBC  WBC 4.0 - 10.5 K/uL 6.2   Hemoglobin 12.0 - 15.0  g/dL 16.1   Hematocrit 09.6 - 46.0 % 38.2   Platelets 150 - 400 K/uL 172      Assessment and plan- Patient is a 89 y.o. female with metastatic ER positive breast cancer currently on tamoxifen.  She is here for a routine follow-up visit  Patient was switched from letrozole to tamoxifen due to worsening osteoporosis.  She does have bilateral lower extremity swelling and there is a potential risk of DVT with tamoxifen as well.  Unfortunately there is no right option here.  For now patient is tolerating tamoxifen well without any significant side effects.I will plan to get a repeat PET scan sometime in August 2024 and see her thereafter with CBC with differential and CMP.  Tumor markers remain stable   Visit Diagnosis 1. Carcinoma of breast metastatic to bone, unspecified laterality (HCC)   2. Encounter for monitoring tamoxifen therapy      Dr. Owens Shark, MD, MPH [facility] [phone] 04/26/2023 12:15 PM

## 2023-04-28 NOTE — Telephone Encounter (Signed)
Requested Prescriptions  Refused Prescriptions Disp Refills   omeprazole (PRILOSEC) 20 MG capsule [Pharmacy Med Name: Omeprazole 20 MG Oral Capsule Delayed Release] 100 capsule 2    Sig: TAKE 1 CAPSULE BY MOUTH DAILY     Gastroenterology: Proton Pump Inhibitors Passed - 04/26/2023 10:05 PM      Passed - Valid encounter within last 12 months    Recent Outpatient Visits           2 weeks ago PAD (peripheral artery disease) (HCC)   [place] [facility] Larae Grooms, NP   3 months ago Aortic atherosclerosis [facility])   [place] [facility] Larae Grooms, NP   5 months ago Aortic atherosclerosis [facility])   [place] [facility] Larae Grooms, NP   10 months ago Sinus congestion   [place] [facility] Gabriel Cirri, NP   1 year ago Chronic intractable headache, unspecified headache type   [place] [facility] Vigg, Avanti, MD       Future Appointments             In 1 month Gollan, Tollie Pizza, MD [place] [facility] [place]   In 2 months Mecum, Oswaldo Conroy, PA-C [place] [facility], [facility]

## 2023-04-30 DIAGNOSIS — Z853 Personal history of malignant neoplasm of breast: Secondary | ICD-10-CM | POA: Diagnosis not present

## 2023-05-02 ENCOUNTER — Other Ambulatory Visit: Payer: Self-pay | Admitting: Nurse Practitioner

## 2023-05-02 DIAGNOSIS — G5793 Unspecified mononeuropathy of bilateral lower limbs: Secondary | ICD-10-CM

## 2023-05-04 ENCOUNTER — Encounter: Payer: Self-pay | Admitting: Nurse Practitioner

## 2023-05-06 ENCOUNTER — Encounter: Payer: Self-pay | Admitting: Nurse Practitioner

## 2023-05-06 ENCOUNTER — Other Ambulatory Visit: Payer: Self-pay | Admitting: Nurse Practitioner

## 2023-05-06 DIAGNOSIS — G5793 Unspecified mononeuropathy of bilateral lower limbs: Secondary | ICD-10-CM

## 2023-05-06 DIAGNOSIS — K589 Irritable bowel syndrome without diarrhea: Secondary | ICD-10-CM

## 2023-05-06 NOTE — Telephone Encounter (Signed)
Called and scheduled appointment for 05/08/2023 with another provider at 10:00 am.  Patient stated that she had no transportation due to the passing of the gentleman that works at the home she lives at.

## 2023-05-06 NOTE — Telephone Encounter (Signed)
Requested Prescriptions  Pending Prescriptions Disp Refills   gabapentin (NEURONTIN) 300 MG capsule [Pharmacy Med Name: GABAPENTIN CAP 300MG  (NEUR)] 600 capsule 2    Sig: TAKE 3 CAPSULES BY MOUTH TWICE  DAILY     Neurology: Anticonvulsants - gabapentin Passed - 05/02/2023 10:03 PM      Passed - Cr in normal range and within 360 days    Creatinine  Date Value Ref Range Status  02/06/2012 0.76 0.60 - 1.30 mg/dL Final   Creatinine, Ser  Date Value Ref Range Status  04/18/2023 0.86 0.44 - 1.00 mg/dL Final         Passed - Completed PHQ-2 or PHQ-9 in the last 360 days      Passed - Valid encounter within last 12 months    Recent Outpatient Visits           3 weeks ago PAD (peripheral artery disease) (HCC)   [place] [facility] Larae Grooms, NP   3 months ago Aortic atherosclerosis [facility])   [place] [facility] Larae Grooms, NP   6 months ago Aortic atherosclerosis [facility])   [place] [facility] Larae Grooms, NP   10 months ago Sinus congestion   [place] [facility] Gabriel Cirri, NP   1 year ago Chronic intractable headache, unspecified headache type   [place] [facility] Vigg, Avanti, MD       Future Appointments             In 1 month Gollan, Tollie Pizza, MD [place] [facility] [place]   In 2 months Mecum, Oswaldo Conroy, PA-C [facility], [facility]

## 2023-05-06 NOTE — Telephone Encounter (Signed)
Medication Refill - Medication: gabapentin (NEURONTIN) 300 MG capsule [[id number]]   omeprazole (PRILOSEC) 20 MG capsule [[id number]]   Has the patient contacted their pharmacy? Yes.   (Agent: If no, request that the patient contact the pharmacy for the refill. If patient does not wish to contact the pharmacy document the reason why and proceed with request.) (Agent: If yes, when and what did the pharmacy advise?)  Preferred Pharmacy (with phone number or street name):  [facility] - [place], [place] - [address] Phone: [phone]  Fax: [phone]     Has the patient been seen for an appointment in the last year OR does the patient have an upcoming appointment? Yes.    Agent: Please be advised that RX refills may take up to 3 business days. We ask that you follow-up with your pharmacy.

## 2023-05-07 ENCOUNTER — Other Ambulatory Visit: Payer: Self-pay | Admitting: Cardiovascular Disease

## 2023-05-07 MED ORDER — GABAPENTIN 300 MG PO CAPS: 900.0000 mg | ORAL_CAPSULE | Freq: Two times a day (BID) | ORAL | 1 refills | Status: AC

## 2023-05-07 MED ORDER — OMEPRAZOLE 20 MG PO CPDR
20.0000 mg | DELAYED_RELEASE_CAPSULE | Freq: Every day | ORAL | 1 refills | Status: AC
Start: 2023-05-07 — End: ?

## 2023-05-07 NOTE — Telephone Encounter (Signed)
Resending to different location of [facility].  Requested Prescriptions  Pending Prescriptions Disp Refills   gabapentin (NEURONTIN) 300 MG capsule 540 capsule 1    Sig: Take 3 capsules (900 mg total) by mouth 2 (two) times daily.     Neurology: Anticonvulsants - gabapentin Passed - 05/06/2023  5:53 PM      Passed - Cr in normal range and within 360 days    Creatinine  Date Value Ref Range Status  02/06/2012 0.76 0.60 - 1.30 mg/dL Final   Creatinine, Ser  Date Value Ref Range Status  04/18/2023 0.86 0.44 - 1.00 mg/dL Final         Passed - Completed PHQ-2 or PHQ-9 in the last 360 days      Passed - Valid encounter within last 12 months    Recent Outpatient Visits           3 weeks ago PAD (peripheral artery disease) (HCC)   [place] [facility] Larae Grooms, NP   3 months ago Aortic atherosclerosis [facility])   [place] [facility] Larae Grooms, NP   6 months ago Aortic atherosclerosis [facility])   [place] [facility] Larae Grooms, NP   10 months ago Sinus congestion   [place] [facility] Gabriel Cirri, NP   1 year ago Chronic intractable headache, unspecified headache type   [place] [facility] Vigg, Avanti, MD       Future Appointments             [place]orrow Pearley, Sherran Needs, NP [place] [facility], [facility]   In 1 month Gollan, Tollie Pizza, MD [place] [facility] [place]   In 2 months Mecum, Oswaldo Conroy, PA-C [place] [facility], [facility]             omeprazole (PRILOSEC) 20 MG capsule 90 capsule 1    Sig: Take 1 capsule (20 mg total) by mouth daily.     Gastroenterology: Proton Pump Inhibitors Passed - 05/06/2023  5:53 PM      Passed - Valid encounter within last 12 months    Recent Outpatient Visits           3 weeks ago PAD (peripheral artery disease) (HCC)   [place] [facility] Larae Grooms, NP   3 months ago  Aortic atherosclerosis [facility])   [place] [facility] Larae Grooms, NP   6 months ago Aortic atherosclerosis [facility])   [place] [facility] Larae Grooms, NP   10 months ago Sinus congestion   [place] [facility] Gabriel Cirri, NP   1 year ago Chronic intractable headache, unspecified headache type   [place] [facility] Vigg, Avanti, MD       Future Appointments             Tomorrow Pearley, Sherran Needs, NP [place] [facility], [facility]   In 1 month Gollan, Tollie Pizza, MD  [facility] [place]   In 2 months Mecum, Oswaldo Conroy, PA-C  [facility], [facility]

## 2023-05-08 ENCOUNTER — Telehealth: Payer: Self-pay | Admitting: Nurse Practitioner

## 2023-05-08 ENCOUNTER — Ambulatory Visit: Payer: Medicare Other | Admitting: Family Medicine

## 2023-05-08 ENCOUNTER — Ambulatory Visit (INDEPENDENT_AMBULATORY_CARE_PROVIDER_SITE_OTHER): Payer: Medicare Other | Admitting: Family Medicine

## 2023-05-08 ENCOUNTER — Encounter: Payer: Self-pay | Admitting: Family Medicine

## 2023-05-08 VITALS — BP 160/80 | HR 73 | Temp 98.8°F

## 2023-05-08 DIAGNOSIS — T2112XA Burn of first degree of abdominal wall, initial encounter: Secondary | ICD-10-CM | POA: Diagnosis not present

## 2023-05-08 DIAGNOSIS — L84 Corns and callosities: Secondary | ICD-10-CM

## 2023-05-08 DIAGNOSIS — R300 Dysuria: Secondary | ICD-10-CM

## 2023-05-08 NOTE — Telephone Encounter (Signed)
Copied from CRM [id number]. Topic: General - Other >> May 08, 2023  3:42 PM Santiya F wrote: Reason for CRM: Pt's daughter Armandina Stammer is calling in requesting a referral for PT to come to the pt's home and check the pt's legs and get pt fitted for a brace. Please advise.

## 2023-05-08 NOTE — Progress Notes (Signed)
BP (!) 160/80   Pulse 73   Temp 98.8 F (37.1 C) (Oral)   SpO2 97%    Subjective:    Patient ID: Kelli Williams, female    DOB: 05-Aug-1926, 89 y.o.   MRN: [id number]  HPI: Kelli Williams is a 89 y.o. female  Chief Complaint  Patient presents with   Urinary Tract Infection   In office BP is elevated today 150/78 and 160/80. She admits to forgetting to take her 40 mg Lisinopril and 1 mg Doxazosin. She states she will take her evening dose of Cardura. Admits that she does not usually forget to take her BP medications. Has appointment with Cardiology on 06/09/2023.   One week ago she obtained a thermal burn to her left lower abdomen from using a heating pad. The wound has scabbed over and is healing now, with some erythema around the edges. She admits to the site blistering initially. She tried burn gel with benzalkonium chloride and lidocaine. She denies pain today.  URINARY SYMPTOMS 4 days ago Dysuria: no Urinary frequency: yes Urgency: yes Small volume voids: no Symptom severity: no Urinary incontinence: yes Foul odor: no Hematuria: no Abdominal pain: yes Back pain: yes Suprapubic pain/pressure: yes Flank pain: no Fever:  no Vomiting: no Relief with cranberry juice: no Status: better/worse/stable Previous urinary tract infection: no Recurrent urinary tract infection: no Treatments attempted: none    LEFT FOOT PAIN Duration: 3 weeks Involved foot: left  Mechanism of injury: unknown Location: sole of foot Onset: gradual  Severity: moderate  Quality:  sharp Frequency: intermittent Radiation: no Aggravating factors: walking, movement, and prolonged sitting  Alleviating factors: rest, supportive shoes Status: worse Treatments attempted:  CBD ointment and rest  Relief with NSAIDs?:   Tylenol extra strength, prefer over Tramadol  Weakness with weight bearing or walking: yes Morning stiffness: no Swelling: yes Redness: no Bruising: no Paresthesias / decreased  sensation: yes  Fevers:no   Relevant past medical, surgical, family and social history reviewed and updated as indicated. Interim medical history since our last visit reviewed. Allergies and medications reviewed and updated.  Review of Systems  Constitutional:  Negative for chills and fever.  Respiratory: Negative.    Cardiovascular: Negative.   Gastrointestinal:  Negative for abdominal pain and vomiting.  Genitourinary:  Positive for dysuria, frequency and urgency. Negative for decreased urine volume, difficulty urinating, dyspareunia, flank pain, hematuria and pelvic pain.  Musculoskeletal:  Positive for back pain.  Skin:  Positive for color change and wound.       Healing nickel size burn in LLQ with scabbing    Per HPI unless specifically indicated above     Objective:    BP (!) 160/80   Pulse 73   Temp 98.8 F (37.1 C) (Oral)   SpO2 97%   Wt Readings from Last 3 Encounters:  04/18/23 193 lb 1.6 oz (87.6 kg)  04/10/23 193 lb 14.4 oz (88 kg)  03/27/23 195 lb (88.5 kg)    Physical Exam Vitals and nursing note reviewed.  Constitutional:      General: She is awake. She is not in acute distress.    Appearance: Normal appearance. She is well-developed and well-groomed. She is not ill-appearing.  HENT:     Head: Normocephalic and atraumatic.     Right Ear: Hearing and external ear normal. No drainage.     Left Ear: Hearing and external ear normal. No drainage.     Nose: Nose normal.  Eyes:  General: Lids are normal.        Right eye: No discharge.        Left eye: No discharge.     Conjunctiva/sclera: Conjunctivae normal.  Cardiovascular:     Rate and Rhythm: Normal rate and regular rhythm.     Heart sounds: Normal heart sounds, S1 normal and S2 normal. No murmur heard.    No gallop.  Pulmonary:     Effort: Pulmonary effort is normal. No accessory muscle usage or respiratory distress.     Breath sounds: Normal breath sounds.  Musculoskeletal:        General:  Normal range of motion.     Cervical back: Full passive range of motion without pain and normal range of motion.     Right lower leg: 2+ Pitting Edema present.     Left lower leg: 2+ Pitting Edema present.       Feet:  Feet:     Right foot:     Skin integrity: Skin integrity normal.     Left foot:     Skin integrity: Warmth and callus present.     Comments: Bilateral foot edema Skin:    General: Skin is warm and dry.     Capillary Refill: Capillary refill takes less than 2 seconds.     Findings: Burn, erythema and wound present.       Neurological:     Mental Status: She is alert and oriented to person, place, and time.  Psychiatric:        Attention and Perception: Attention normal.        Mood and Affect: Mood normal.        Speech: Speech normal.        Behavior: Behavior normal. Behavior is cooperative.        Thought Content: Thought content normal.     Results for orders placed or performed in visit on 04/18/23  CA 27.29 (SERIAL MONITOR)  Result Value Ref Range   CA 27.29 16.3 0.0 - 38.6 U/mL  Comprehensive metabolic panel  Result Value Ref Range   Sodium 139 135 - 145 mmol/L   Potassium 3.7 3.5 - 5.1 mmol/L   Chloride 105 98 - 111 mmol/L   CO2 24 22 - 32 mmol/L   Glucose, Bld 103 (H) 70 - 99 mg/dL   BUN 36 (H) 8 - 23 mg/dL   Creatinine, Ser 1.61 0.44 - 1.00 mg/dL   Calcium 8.5 (L) 8.9 - 10.3 mg/dL   Total Protein 7.1 6.5 - 8.1 g/dL   Albumin 4.0 3.5 - 5.0 g/dL   AST 23 15 - 41 U/L   ALT 11 0 - 44 U/L   Alkaline Phosphatase 54 38 - 126 U/L   Total Bilirubin 0.4 0.3 - 1.2 mg/dL   GFR, Estimated >09 >60 mL/min   Anion gap 10 5 - 15  CBC with Differential  Result Value Ref Range   WBC 6.2 4.0 - 10.5 K/uL   RBC 4.14 3.87 - 5.11 MIL/uL   Hemoglobin 12.1 12.0 - 15.0 g/dL   HCT 45.4 09.8 - 11.9 %   MCV 92.3 80.0 - 100.0 fL   MCH 29.2 26.0 - 34.0 pg   MCHC 31.7 30.0 - 36.0 g/dL   RDW 14.7 82.9 - 56.2 %   Platelets 172 150 - 400 K/uL   nRBC 0.0 0.0 - 0.2 %    Neutrophils Relative % 65 %   Neutro Abs 4.0 1.7 - 7.7 K/uL  Lymphocytes Relative 22 %   Lymphs Abs 1.4 0.7 - 4.0 K/uL   Monocytes Relative 6 %   Monocytes Absolute 0.4 0.1 - 1.0 K/uL   Eosinophils Relative 5 %   Eosinophils Absolute 0.3 0.0 - 0.5 K/uL   Basophils Relative 1 %   Basophils Absolute 0.1 0.0 - 0.1 K/uL   Immature Granulocytes 1 %   Abs Immature Granulocytes 0.03 0.00 - 0.07 K/uL      Assessment & Plan:   Problem List Items Addressed This Visit   None Visit Diagnoses     Burn of abdomen wall, first degree, initial encounter    -  Primary   Acute, stable. Healing wound with scab formation. Recommend neosporin and avoidance of heat pad use.   Dysuria       Acute, stable. Symptoms have improved. Provided urine cup to bring sample by office when able to, future order for UA and Urine culture done.   Relevant Orders   Urinalysis, Routine w reflex microscopic   Urine Culture   Calluse       Acute, ongoing. Referral made to podiatry. Recommend soaking foot in warm water and using file to soften callus. Tylenol for pain relief.   Relevant Orders   Ambulatory referral to Podiatry        Follow up plan: Return if symptoms worsen or fail to improve.

## 2023-05-08 NOTE — Patient Instructions (Addendum)
TAKE BP MEDICATIONS DAILY  Provide urine sample in cup and bring back to office Try Gatorade and water for rehydration   Foot Pain Try soaking foot in warm water and use file over calluses Wear supportive shoes with thick sole for protection with thick socks Elevate feet above heart level to help with swelling Follow up with podiatry for shoe recommendations.

## 2023-05-12 NOTE — Telephone Encounter (Signed)
Please call and schedule the patient an appointment per Clydie Braun. This type of request is required by Medicare to have documentation in an office visit note.

## 2023-05-12 NOTE — Telephone Encounter (Signed)
Appointment has been scheduled.

## 2023-05-12 NOTE — Telephone Encounter (Signed)
Unfortunately, Medicare requires this to be documented in a visit.  Since this hasn't been addressed she will have to come back in for a visit.  The visit has to be done within 30 days of initiating home health.

## 2023-05-13 NOTE — Telephone Encounter (Signed)
Pt is calling back not wanting to keep her appt for tomorrow 05/14/23.   Pt states that she has a cold and does not want to come to the office.   Pt states that she is not interested in getting a leg brace, she states that another physician ordered her one before and it did not help. Pt also states that she does not want to do Physical Therapy at this time either, per pt it is not appealing to her.   Pt states that she will think about this and will call back later to reschedule an appt if she decides that she wants this.

## 2023-05-13 NOTE — Telephone Encounter (Signed)
FYI to provider

## 2023-05-13 NOTE — Telephone Encounter (Signed)
Noted  

## 2023-05-14 ENCOUNTER — Ambulatory Visit: Payer: Medicare Other | Admitting: Nurse Practitioner

## 2023-05-31 ENCOUNTER — Telehealth: Payer: Medicare Other | Admitting: Nurse Practitioner

## 2023-05-31 DIAGNOSIS — R399 Unspecified symptoms and signs involving the genitourinary system: Secondary | ICD-10-CM

## 2023-05-31 DIAGNOSIS — M545 Low back pain, unspecified: Secondary | ICD-10-CM

## 2023-05-31 NOTE — Progress Notes (Signed)
Based on what you shared with me it looks like you have uti symptoms with back pain,that should be evaluated in a face to face office visit. Due to the associating back pain you will need a urinalysis and urine culture for proper treatment. NOTE: There will be NO CHARGE for this eVisit   If you are having a true medical emergency please call 911.      For an urgent face to face visit, [place] has six urgent care centers for your convenience:     [facility] [place] Get Driving Directions [phone] [address] [place], [place] [address]    [facility] ([place]) Get Driving Directions [phone] [address] [place], [place] [address]  [facility] ([place]) Get Driving Directions [phone] [address] [place],  [place]  [address]  [facility] Get Driving Directions [phone] [address] [place], [place] [address]   [facility] [place] Get Driving Directions  [phone] [address] [place], [place] [address]    [facility] [place] Get Driving Directions [phone] [address] [place], [place] [address]  Your MyChart E-visit questionnaire answers were reviewed by a board certified advanced clinical practitioner to complete your personal care plan based on your specific symptoms.  Thank you for using e-Visits.         

## 2023-06-09 ENCOUNTER — Ambulatory Visit: Payer: Medicare Other | Admitting: Cardiovascular Disease

## 2023-06-10 DIAGNOSIS — I890 Lymphedema, not elsewhere classified: Secondary | ICD-10-CM | POA: Diagnosis not present

## 2023-06-10 DIAGNOSIS — B351 Tinea unguium: Secondary | ICD-10-CM | POA: Diagnosis not present

## 2023-06-10 DIAGNOSIS — M79675 Pain in left toe(s): Secondary | ICD-10-CM | POA: Diagnosis not present

## 2023-06-10 DIAGNOSIS — M79674 Pain in right toe(s): Secondary | ICD-10-CM | POA: Diagnosis not present

## 2023-06-10 DIAGNOSIS — C7951 Secondary malignant neoplasm of bone: Secondary | ICD-10-CM | POA: Diagnosis not present

## 2023-06-17 ENCOUNTER — Ambulatory Visit: Payer: Medicare Other | Admitting: Cardiovascular Disease

## 2023-07-03 ENCOUNTER — Other Ambulatory Visit: Payer: Self-pay | Admitting: Cardiovascular Disease

## 2023-07-11 ENCOUNTER — Ambulatory Visit: Payer: Medicare Other | Admitting: Physician Assistant

## 2023-07-11 ENCOUNTER — Other Ambulatory Visit: Payer: Self-pay | Admitting: Nurse Practitioner

## 2023-07-11 DIAGNOSIS — K589 Irritable bowel syndrome without diarrhea: Secondary | ICD-10-CM

## 2023-07-11 DIAGNOSIS — G5793 Unspecified mononeuropathy of bilateral lower limbs: Secondary | ICD-10-CM
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
## 2023-07-15 ENCOUNTER — Ambulatory Visit: Payer: Medicare Other | Admitting: Physician Assistant

## 2023-07-18 ENCOUNTER — Ambulatory Visit (INDEPENDENT_AMBULATORY_CARE_PROVIDER_SITE_OTHER): Payer: Medicare Other | Admitting: Physician Assistant

## 2023-07-18 ENCOUNTER — Encounter: Payer: Self-pay | Admitting: Physician Assistant

## 2023-07-18 VITALS — BP 142/70

## 2023-07-18 DIAGNOSIS — M7989 Other specified soft tissue disorders: Secondary | ICD-10-CM

## 2023-07-18 DIAGNOSIS — I700 Atherosclerosis of aorta: Secondary | ICD-10-CM

## 2023-07-18 DIAGNOSIS — I10 Essential (primary) hypertension: Secondary | ICD-10-CM

## 2023-07-18 DIAGNOSIS — E559 Vitamin D deficiency, unspecified: Secondary | ICD-10-CM

## 2023-07-18 DIAGNOSIS — R300 Dysuria: Secondary | ICD-10-CM | POA: Diagnosis not present

## 2023-07-18 DIAGNOSIS — I739 Peripheral vascular disease, unspecified: Secondary | ICD-10-CM

## 2023-07-18 DIAGNOSIS — R7309 Other abnormal glucose: Secondary | ICD-10-CM

## 2023-07-18 LAB — URINALYSIS, ROUTINE W REFLEX MICROSCOPIC
Bilirubin, UA: NEGATIVE
Glucose, UA: NEGATIVE
Ketones, UA: NEGATIVE
Nitrite, UA: NEGATIVE
Protein,UA: NEGATIVE
RBC, UA: NEGATIVE
Specific Gravity, UA: 1.02 (ref 1.005–1.030)
Urobilinogen, Ur: 0.2 mg/dL (ref 0.2–1.0)
pH, UA: 5.5 (ref 5.0–7.5)

## 2023-07-18 LAB — MICROSCOPIC EXAMINATION: Bacteria, UA: NONE SEEN

## 2023-07-18 NOTE — Progress Notes (Signed)
Established Patient Office Visit  Name: Kelli Williams   MRN: [id number]    DOB: [birth year]   Date:07/18/2023  Today's Provider: Jacquelin Hawking, MHS, PA-C Introduced myself to the patient as a PA-C and provided education on APPs in clinical practice.         Subjective  Chief Complaint  Chief Complaint  Patient presents with   Hyperlipidemia   Hypertension   Foot Swelling    Patient says she has been having foot swelling and is not able to wear shoes. Patient says she is having a golf ball size lump under her foot and says it hurts when she steps down on it. Patient says she always keeps her feet elevated. Patient has been seen by three providers for her feet.     HPI   Hypertension/ CAD/ PAD - Medications: Lisinopril 40 mg pO every day, Doxazosin 1 mg PO BID  - Compliance: good compliance  - Checking BP at home: yes checking at home, today she got 170/125  - Denies any SOB, CP, vision changes,  medication SEs, or symptoms of hypotension She reports having "terrible headaches" that have been intermittent for most of the year and seem to be getting more frequent    Leg Swelling Reviewed notes from Podiatry from 06/10/23 She reports concerns for increased swelling and tenderness   She reports concerns for UTI She states she has dysuria, headaches and right sides abdominal pain     Breast Cancer  She is followed regularly by Oncology  She is taking Tamoxifen now     Patient Active Problem List   Diagnosis Date Noted   Impaired mobility and ADLs 06/25/2022   Polypharmacy 06/25/2022   Chronic intractable headache 03/12/2022   Onychomycosis 03/12/2022   DNR (do not resuscitate) 02/26/2022   Acute midline back pain 03/12/2021   Lower extremity neuropathy 04/25/2020   Lumbar pain 04/26/2019   PAD (peripheral artery disease) (HCC) 07/01/2018   Aortic atherosclerosis (HCC) 07/01/2018   Coronary artery calcification seen on CT scan 07/01/2018   Cancer associated  pain 02/05/2018   Goals of care, counseling/discussion 11/24/2017   Candida infection 11/24/2017   Esophageal reflux 10/28/2017   Postoperative seroma of subcutaneous tissue after non-dermatologic procedure 10/22/2017   Osteopenia 09/08/2017   Irritable bowel syndrome without diarrhea 06/27/2017   BMI 33.0-33.9,adult 06/27/2017   OA (osteoarthritis) of knee 10/16/2016   Leg swelling 10/11/2015   Neuropathy involving both lower extremities 06/19/2015   Secondary localized osteoarthrosis, ankle and foot 06/01/2015   Avitaminosis D 06/01/2015   Essential hypertension 08/17/2012   History of breast cancer 08/17/2012    Past Surgical History:  Procedure Laterality Date   ABDOMINAL HYSTERECTOMY     AXILLARY LYMPH NODE BIOPSY Left 07/10/2017   INVASIVE MAMMARY CARCINOMA WITH FOCAL LOBULAR FEATURES.    BREAST BIOPSY Bilateral 1960   benign   BREAST LUMPECTOMY Left 2000   breast ca with rad tx   BREAST LUMPECTOMY Left 08/20/2017   BREAST LUMPECTOMY Left 08/20/2017   Procedure: left breast wide excision;  Surgeon: Earline Mayotte, MD;  Location: [facility] ORS;  Service: General;  Laterality: Left;   CATARACT EXTRACTION     left eye   CHOLECYSTECTOMY     JOINT REPLACEMENT     NASAL SEPTUM SURGERY     PARTIAL KNEE ARTHROPLASTY     right   TOTAL VAGINAL HYSTERECTOMY      Family History  Problem Relation Age  of Onset   Heart disease Mother    Heart attack Paternal Uncle    Breast cancer Daughter 44       genetic negative   Cancer Paternal Grandmother     Social History   Tobacco Use   Smoking status: Never   Smokeless tobacco: Never   Tobacco comments:    smoking cessation materials not required  Substance Use Topics   Alcohol use: No    Alcohol/week: 0.0 standard drinks of alcohol     Current Outpatient Medications:    acetaminophen (TYLENOL) 650 MG CR tablet, Take by mouth., Disp: , Rfl:    aspirin 81 MG tablet, Take 1 tablet by mouth daily., Disp: , Rfl:    doxazosin  (CARDURA) 1 MG tablet, Take 1 tablet (1 mg total) by mouth 2 (two) times daily., Disp: 180 tablet, Rfl: 3   ezetimibe (ZETIA) 10 MG tablet, TAKE 1 TABLET BY MOUTH DAILY, Disp: 60 tablet, Rfl: 5   gabapentin (NEURONTIN) 300 MG capsule, Take 3 capsules (900 mg total) by mouth 2 (two) times daily., Disp: 540 capsule, Rfl: 1   lisinopril (ZESTRIL) 40 MG tablet, TAKE 1 TABLET BY MOUTH DAILY, Disp: 60 tablet, Rfl: 5   meloxicam (MOBIC) 15 MG tablet, TAKE 1 TABLET BY MOUTH DAILY, Disp: 90 tablet, Rfl: 3   Multiple Vitamins-Minerals (MULTIVITAMIN WITH MINERALS) tablet, Take 1 tablet by mouth daily., Disp: , Rfl:    omeprazole (PRILOSEC) 20 MG capsule, Take 1 capsule (20 mg total) by mouth daily., Disp: 90 capsule, Rfl: 1   tamoxifen (NOLVADEX) 20 MG tablet, TAKE 1 TABLET BY MOUTH DAILY, Disp: 100 tablet, Rfl: 2  Allergies  Allergen Reactions   Amitriptyline Hives   Amoxicillin     Diarrhea     Black Pepper [Piper]    Ivp Dye [Iodinated Contrast Media]    Tape Other (See Comments)    Whelps - please use paper tape.   Silicone Rash    Whelps - please use paper tape.   Sulfa Antibiotics Rash    I personally reviewed active problem list, medication list, allergies, health maintenance, notes from last encounter, lab results with the patient/caregiver today.   Review of Systems  Cardiovascular:  Positive for leg swelling. Negative for chest pain and palpitations.  Gastrointestinal:  Positive for abdominal pain.  Genitourinary:  Positive for dysuria.  Neurological:  Positive for headaches. Negative for dizziness and tingling.      Objective  Vitals:   07/18/23 1329 07/18/23 1349  BP:  (!) 142/70  SpO2: 96%     There is no height or weight on file to calculate BMI.  Physical Exam Vitals reviewed.  Constitutional:      General: She is awake.     Appearance: Normal appearance. She is well-developed and well-groomed.  HENT:     Head: Normocephalic and atraumatic.  Cardiovascular:      Rate and Rhythm: Normal rate and regular rhythm.     Pulses: Normal pulses.          Radial pulses are 2+ on the right side and 2+ on the left side.     Heart sounds: Normal heart sounds. No murmur heard.    No friction rub. No gallop.  Pulmonary:     Effort: Pulmonary effort is normal.     Breath sounds: Normal breath sounds. No decreased air movement. No decreased breath sounds, wheezing, rhonchi or rales.  Musculoskeletal:     Right lower leg: 3+ Edema present.  Left lower leg: 3+ Edema present.  Neurological:     Mental Status: She is alert.  Psychiatric:        Behavior: Behavior is cooperative.      No results found for this or any previous visit (from the past 2160 hour(s)).   PHQ2/9:    07/18/2023    1:30 PM 05/08/2023    4:56 PM 04/10/2023    2:09 PM 01/08/2023    2:14 PM 11/05/2022    1:06 PM  Depression screen PHQ 2/9  Decreased Interest 0 0 0 0 0  Down, Depressed, Hopeless 1 0 0 0 0  PHQ - 2 Score 1 0 0 0 0  Altered sleeping 0 0 0 0 0  Tired, decreased energy 3 2 2 2 1   Change in appetite 0 0 0 0 0  Feeling bad or failure about yourself  0 1 0 0 0  Trouble concentrating 0 0 0 0 0  Moving slowly or fidgety/restless 0 0 0 0 0  Suicidal thoughts 0 0 0 0 0  PHQ-9 Score 4 3 2 2 1   Difficult doing work/chores Not difficult at all Not difficult at all Not difficult at all Not difficult at all Not difficult at all      Fall Risk:    07/18/2023    1:30 PM 04/10/2023    2:09 PM 01/08/2023    2:14 PM 01/08/2023    2:11 PM 11/05/2022    1:06 PM  Fall Risk   Falls in the past year? 1 1 0 0 0  Number falls in past yr: 0 0 0 0 0  Injury with Fall? 0 0 0 0 0  Risk for fall due to : No Fall Risks Impaired balance/gait;Impaired mobility No Fall Risks Impaired balance/gait;Impaired mobility No Fall Risks  Follow up Falls evaluation completed Falls evaluation completed Falls evaluation completed  Falls evaluation completed      Functional Status Survey:       Assessment & Plan  Problem List Items Addressed This Visit       Cardiovascular and Mediastinum   Essential hypertension    Chronic, controlled and appears overall well managed today She is taking Lisinopril 40 mg PO every day and Doxazosin 1 mg PO BID  Continue current regimen- medications appear to be managed by Cardiology. Continue collaboration with Cardiology. Follow up in 3 months or sooner if concerns arise        Relevant Orders   Comp Met (CMET)   CBC w/Diff   PAD (peripheral artery disease) (HCC) - Primary    Chronic, historic condition She has severe 3+ swelling to lower extremities and feet are cool to the touch Will place referral to Vascular specialty for evaluation and management Recommend she keeps legs elevated and tries to use compression stockings to help with swelling Follow up in 3 months or sooner if concerns arise         Relevant Orders   Ambulatory referral to Vascular Surgery   Aortic atherosclerosis (HCC)    Chronic, historic condition Continue current regimen comprised of Zetia  Appears well managed with current regimen- repeat lipid panel today for monitoring Continue collaboration with Cardiology  Follow up in 6 months or sooner if concerns arise          Other   Avitaminosis D    Recheck labs today  Results to dictate further management       Relevant Orders   Vitamin D (25 hydroxy)  Leg swelling    Chronic, ongoing  Appears exacerbated at this time and legs are 3+ today, does not appear to have pitting edema right now  Will place referral to Vascular as she has PAD and likely has other vascular issues ongoing Recommend elevating legs and using compression stockings as tolerated. Follow up as needed for persistent or progressing symptoms        Relevant Orders   Ambulatory referral to Vascular Surgery   Other Visit Diagnoses     Dysuria     Acute, new concern She reports dysuria and headaches along with lower right  abdominal pain Will complete UA -results to dictate further management    Relevant Orders   Urinalysis, Routine w reflex microscopic   Elevated glucose level     Check A1c today- results to dictate further management    Relevant Orders   HgB A1c        No follow-ups on file.   I,  E , PA-C, have reviewed all documentation for this visit. The documentation on 07/18/23 for the exam, diagnosis, procedures, and orders are all accurate and complete.   Jacquelin Hawking, MHS, PA-C [facility]

## 2023-07-18 NOTE — Progress Notes (Deleted)
          Acute Office Visit   Patient: Kelli Williams   DOB: 30-Mar-1926   89 y.o. Female  MRN: [id number] Visit Date: 07/18/2023  Today's healthcare provider: Oswaldo Conroy , PA-C  Introduced myself to the patient as a Secondary school teacher and provided education on APPs in clinical practice.    No chief complaint on file.  Subjective    HPI    Hypertension/ CAD/ PAD - Medications: *** - Compliance: *** - Checking BP at home: *** - Denies any SOB, CP, vision changes, LE edema, medication SEs, or symptoms of hypotension - Diet: *** - Exercise: ***  Breast Cancer  She is followed regularly by Oncology  She is taking Tamoxifen now   Medications: Outpatient Medications Prior to Visit  Medication Sig   acetaminophen (TYLENOL) 650 MG CR tablet Take by mouth.   aspirin 81 MG tablet Take 1 tablet by mouth daily.   doxazosin (CARDURA) 1 MG tablet Take 1 tablet (1 mg total) by mouth 2 (two) times daily.   ezetimibe (ZETIA) 10 MG tablet TAKE 1 TABLET BY MOUTH DAILY   gabapentin (NEURONTIN) 300 MG capsule Take 3 capsules (900 mg total) by mouth 2 (two) times daily.   lisinopril (ZESTRIL) 40 MG tablet TAKE 1 TABLET BY MOUTH DAILY   meloxicam (MOBIC) 15 MG tablet TAKE 1 TABLET BY MOUTH DAILY   Multiple Vitamins-Minerals (MULTIVITAMIN WITH MINERALS) tablet Take 1 tablet by mouth daily.   omeprazole (PRILOSEC) 20 MG capsule Take 1 capsule (20 mg total) by mouth daily.   tamoxifen (NOLVADEX) 20 MG tablet TAKE 1 TABLET BY MOUTH DAILY   No facility-administered medications prior to visit.    Review of Systems  {Insert previous labs (optional):23779} {See past labs  Heme  Chem  Endocrine  Serology  Results Review (optional):1}   Objective    There were no vitals taken for this visit. {Insert last BP/Wt (optional):23777}{See vitals history (optional):1}   Physical Exam    No results found for any visits on 07/18/23.  Assessment & Plan      No follow-ups on file.

## 2023-07-18 NOTE — Assessment & Plan Note (Signed)
Chronic, historic condition Continue current regimen comprised of Zetia  Appears well managed with current regimen- repeat lipid panel today for monitoring Continue collaboration with Cardiology  Follow up in 6 months or sooner if concerns arise

## 2023-07-18 NOTE — Assessment & Plan Note (Signed)
Chronic, historic condition She has severe 3+ swelling to lower extremities and feet are cool to the touch Will place referral to Vascular specialty for evaluation and management Recommend she keeps legs elevated and tries to use compression stockings to help with swelling Follow up in 3 months or sooner if concerns arise

## 2023-07-18 NOTE — Assessment & Plan Note (Signed)
Chronic, ongoing  Appears exacerbated at this time and legs are 3+ today, does not appear to have pitting edema right now  Will place referral to Vascular as she has PAD and likely has other vascular issues ongoing Recommend elevating legs and using compression stockings as tolerated. Follow up as needed for persistent or progressing symptoms

## 2023-07-18 NOTE — Assessment & Plan Note (Signed)
Chronic, controlled and appears overall well managed today She is taking Lisinopril 40 mg PO every day and Doxazosin 1 mg PO BID  Continue current regimen- medications appear to be managed by Cardiology. Continue collaboration with Cardiology. Follow up in 3 months or sooner if concerns arise

## 2023-07-18 NOTE — Assessment & Plan Note (Signed)
Recheck labs today  Results to dictate further management

## 2023-07-21 ENCOUNTER — Telehealth: Payer: Medicare Other | Admitting: Physician Assistant

## 2023-07-21 DIAGNOSIS — J019 Acute sinusitis, unspecified: Secondary | ICD-10-CM | POA: Diagnosis not present

## 2023-07-21 DIAGNOSIS — B9689 Other specified bacterial agents as the cause of diseases classified elsewhere: Secondary | ICD-10-CM

## 2023-07-21 MED ORDER — DOXYCYCLINE HYCLATE 100 MG PO TABS: 100.0000 mg | ORAL_TABLET | Freq: Two times a day (BID) | ORAL | 0 refills | Status: DC

## 2023-07-21 NOTE — Progress Notes (Signed)

## 2023-07-23 NOTE — Progress Notes (Signed)
Your labs are back Your electrolytes, liver and kidney function are overall normal at this time Your A1c is 5.8 which is in Prediabetic range. No medications are indicated at this time. Alc is a number that reflects the 3 month average of your blood glucose and is used to determine if someone has prediabetes or diabetes Your CBC was normal and your urine was overall okay Your Vitamin D was in normal range

## 2023-07-24 ENCOUNTER — Inpatient Hospital Stay: Admission: RE | Admit: 2023-07-24 | Payer: Medicare Other | Source: Ambulatory Visit

## 2023-08-07 ENCOUNTER — Encounter: Payer: Self-pay | Admitting: Physician Assistant

## 2023-08-07 DIAGNOSIS — I739 Peripheral vascular disease, unspecified: Secondary | ICD-10-CM

## 2023-08-07 NOTE — Progress Notes (Signed)
If Dr. Ardiology Office Note  Date:  08/08/2023   ID:  Kelli Williams, DOB Dec 25, 1925, MRN [id number]  PCP:  Larae Grooms, NP   Chief Complaint  Patient presents with   4 month follow up     Patient c/o shortness of breath & bilateral LE edema. Medications reviewed by the patient verbally.     HPI:  Kelli Williams is a 89 year old woman  Mild aortic atherosclerosis Status post radiation treatment for breast cancer in 2000 on the left with 7 weeks of radiation on a daily basis.  She lost her husband in March 2016.   last echo 2011: normal EF 60%, Aortic atherosclerosis, coronary calcification, left carotid atherosclerosis  who presents for routine followup of her tachycardia/palpitations, breast cancer  LOV with myself September 2023 Seen by one of our providers April 2024 Lives at [facility]  Chronic leg swelling consistent with lymphedema Seems to be getting worse recently Will to place compression hose as feet are too swollen, unable to reach [facility] with a walker  Labs reviewed A1C 5.8 HGB 11.9 Normal BMP LDL 50  EKG personally reviewed by myself on todays visit EKG Interpretation Date/Time:  Friday August 08 2023 14:20:38 EDT Ventricular Rate:  81 PR Interval:  170 QRS Duration:  100 QT Interval:  396 QTC Calculation: 460 R Axis:   -37  Text Interpretation: Sinus rhythm with frequent Premature ventricular complexes Left axis deviation Moderate voltage criteria for LVH, may be normal variant ( R in aVL , Cornell product ) Septal infarct (cited on or before 14-Aug-2017) When compared with ECG of 14-Aug-2017 12:58, No significant change was found Confirmed by Julien Nordmann [phone]) on 08/08/2023 2:26:28 PM    Hx of metastatic ER positive breast Cancer with bone mets as disease currently on letrozole  History of bradycardia on metoprolol  Followed by oncology for cancer CT scan,  No evidence of progressive metastatic disease. 2. Unchanged lytic  lesions of the thoracic and lumbar spine, compatible with osseous metastatic disease. 3.  Aortic Atherosclerosis (ICD10-I70.0).  mild to moderate aortic atherosclerosis  lymph node under left arm resected, tested positive for breast cancer 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 Moderate cardiomegaly.  at least mild to moderate diffuse aortic atherosclerosis extending up to the carotids, some degree of coronary calcification/difficult to determine secondary to motion artifact    PMH:   has a past medical history of Anxiety, Arthritis, Breast cancer (HCC) (2000), Breast cancer of upper-outer quadrant of left female breast (HCC) (07/10/2017), Complication of anesthesia, Dysrhythmia, GERD (gastroesophageal reflux disease), Hypertension, Malignant neoplasm metastatic to bone (HCC) (09/23/2017), Malignant neoplasm metastatic to bone (HCC) (09/23/2017), Neuropathic pain of both legs, Neuropathy, Personal history of chemotherapy, Personal history of radiation therapy, Pneumonia (2016), Skin cancer of forehead, and Skin cancer of nose.  PSH:    Past Surgical History:  Procedure Laterality Date   ABDOMINAL HYSTERECTOMY     AXILLARY LYMPH NODE BIOPSY Left 07/10/2017   INVASIVE MAMMARY CARCINOMA WITH FOCAL LOBULAR FEATURES.    BREAST BIOPSY Bilateral 1960   benign   BREAST LUMPECTOMY Left 2000   breast ca with rad tx   BREAST LUMPECTOMY Left 08/20/2017   BREAST LUMPECTOMY Left 08/20/2017   Procedure: left breast wide excision;  Surgeon: Earline Mayotte, MD;  Location: [facility] ORS;  Service: General;  Laterality: Left;   CATARACT EXTRACTION     left eye   CHOLECYSTECTOMY     JOINT REPLACEMENT     NASAL SEPTUM SURGERY     PARTIAL KNEE ARTHROPLASTY      right   TOTAL VAGINAL HYSTERECTOMY      Current Outpatient Medications  Medication Sig Dispense Refill   acetaminophen (TYLENOL) 650 MG CR tablet Take by mouth.     aspirin 81 MG tablet Take 1 tablet by mouth daily.     doxazosin (CARDURA) 1 MG tablet Take 1 tablet (1 mg total) by mouth 2 (two) times daily. 180 tablet 3   ezetimibe (ZETIA) 10 MG tablet TAKE 1 TABLET BY MOUTH DAILY 60 tablet 5   gabapentin (NEURONTIN) 300 MG capsule Take 3 capsules (900 mg total) by mouth 2 (two) times daily. 540 capsule 1   lisinopril (ZESTRIL) 40 MG tablet TAKE 1 TABLET BY MOUTH DAILY 60 tablet 5   meloxicam (MOBIC) 15 MG tablet TAKE 1 TABLET BY MOUTH DAILY 90 tablet 3   Multiple Vitamins-Minerals (MULTIVITAMIN WITH MINERALS) tablet Take 1 tablet by mouth daily.     omeprazole (PRILOSEC) 20 MG capsule Take 1 capsule (20 mg total) by mouth daily. 90 capsule 1   tamoxifen (NOLVADEX) 20 MG tablet TAKE 1 TABLET BY MOUTH DAILY 100 tablet 2   No current facility-administered medications for this visit.    Allergies:   Amitriptyline, Amoxicillin, Black pepper [piper], Ivp dye [iodinated contrast media], Tape, Silicone, and Sulfa antibiotics   Social History:  The patient  reports that she has never smoked. She has never used smokeless tobacco. She reports that she does not drink alcohol and does not use drugs.   Family History:   family history includes Breast cancer (age of onset: 53) in her daughter; Cancer in her paternal grandmother; Heart attack in her paternal uncle; Heart disease in her mother.    Review of Systems: Review of Systems  Constitutional: Negative.   HENT: Negative.    Respiratory: Negative.    Cardiovascular:  Positive for leg swelling.  Gastrointestinal: Negative.   Musculoskeletal:  Positive for joint pain.       Difficulty walking  Neurological: Negative.   Psychiatric/Behavioral: Negative.    All other systems reviewed and are negative.  PHYSICAL EXAM: VS:  BP 130/70 (BP  Location: Right Arm, Patient Position: Sitting, Cuff Size: Normal)   Pulse 81   Ht 5\' 3"  (1.6 m)   Wt 192 lb 2 oz (87.1 kg)   SpO2 94%   BMI 34.03 kg/m  , BMI Body mass index is 34.03 kg/m. Constitutional:  oriented to person, place, and time. No distress.  HENT:  Head: Grossly normal Eyes:  no discharge. No scleral icterus.  Neck: No JVD, no carotid bruits  Cardiovascular: Regular rate and rhythm, no murmurs appreciated Pulmonary/Chest: Clear to auscultation bilaterally, no wheezes or rails Abdominal: Soft.  no distension.  no tenderness.  Musculoskeletal: Normal range of motion Neurological:  normal muscle tone. Coordination normal. No atrophy Skin: Skin warm and dry Psychiatric: normal affect, pleasant  Recent Labs: 07/18/2023: ALT 9; BUN 27; Creatinine, Ser 0.83; Hemoglobin 11.9; Platelets 161; Potassium 4.0; Sodium 142    Lipid Panel Lab Results  Component Value Date   CHOL 138 04/10/2023   HDL 64 04/10/2023   LDLCALC 53 04/10/2023   TRIG 118 04/10/2023    Wt Readings from Last 3 Encounters:  08/08/23 192 lb 2 oz (  87.1 kg)  04/18/23 193 lb 1.6 oz (87.6 kg)  04/10/23 193 lb 14.4 oz (88 kg)     ASSESSMENT AND PLAN:  Essential hypertension - Blood pressure is well controlled on today's visit. No changes made to the medications.  Atrial tachycardia (HCC) - Plan: EKG 12-Lead No sx, off b-blocker secondary to bradycardia Maintinaing NSR Denies paroxysmal tachycardia symptoms  Adjustment disorder with other symptom  loss of her husband [facility] [place] Recommended walking program  Leg swelling -  Lymphedema, unable to place TED hose, foot too big, debility suggest leg elevation interested in lymphedema compression pumps No indication of elevated right heart pressures on echo April 2023 Referral to vascular for further discussion concerning lymphedema compression pumps  Breast cancer Previously treated with chemotherapy medication has  completed XRT Hx of metastases to bone Follow-up oncology  Aortic atherosclerosis/hyperlipidemia Mild-to-moderate in nature Continue  Zetia 10 mg daily LDL at goal  Coronary calcifiction Seen on CT scan mild ortic athero Continue zetia Denies anginal symptoms   Total encounter time more than 30 minutes  Greater than 50% was spent in counseling and coordination of care with the patient    Orders Placed This Encounter  Procedures   EKG 12-Lead     Signed, Dossie Arbour, M.D., Ph.D. 08/08/2023  [facility] [place], [place] [phone]

## 2023-08-08 ENCOUNTER — Encounter: Payer: Self-pay | Admitting: Cardiovascular Disease

## 2023-08-08 ENCOUNTER — Ambulatory Visit: Payer: Medicare Other | Attending: Cardiovascular Disease | Admitting: Cardiovascular Disease

## 2023-08-08 VITALS — BP 130/70 | HR 81 | Ht 63.0 in | Wt 192.1 lb

## 2023-08-08 DIAGNOSIS — E782 Mixed hyperlipidemia: Secondary | ICD-10-CM

## 2023-08-08 DIAGNOSIS — R600 Localized edema: Secondary | ICD-10-CM

## 2023-08-08 DIAGNOSIS — I10 Essential (primary) hypertension: Secondary | ICD-10-CM

## 2023-08-08 DIAGNOSIS — I700 Atherosclerosis of aorta: Secondary | ICD-10-CM

## 2023-08-08 DIAGNOSIS — C7951 Secondary malignant neoplasm of bone: Secondary | ICD-10-CM

## 2023-08-08 DIAGNOSIS — I2510 Atherosclerotic heart disease of native coronary artery without angina pectoris: Secondary | ICD-10-CM | POA: Diagnosis not present

## 2023-08-08 DIAGNOSIS — I890 Lymphedema, not elsewhere classified: Secondary | ICD-10-CM | POA: Diagnosis not present

## 2023-08-08 DIAGNOSIS — I739 Peripheral vascular disease, unspecified: Secondary | ICD-10-CM

## 2023-08-08 NOTE — Patient Instructions (Signed)
Referral to vascular/Dr. Dew/Schneir for lymphedema compression pumps   Medication Instructions:  No changes  If you need a refill on your cardiac medications before your next appointment, please call your pharmacy.   Lab work: No new labs needed  Testing/Procedures: No new testing needed  Follow-Up: At [facility] you and your health needs are our priority.  As part of our continuing mission to provide you with exceptional heart care, we have created designated Provider Care Teams.  These Care Teams include your primary Cardiologist (physician) and Advanced Practice Providers (APPs -  Physician Assistants and Nurse Practitioners) who all work together to provide you with the care you need, when you need it.  You will need a follow up appointment in 12 months  Providers on your designated Care Team:   Nicolasa Ducking, NP Eula Listen, PA-C Cadence Fransico Michael, [place]  COVID-19 Vaccine Information can be found at: [email or web address] For questions related to vaccine distribution or appointments, please email [email or web address] or call [phone].

## 2023-08-13 ENCOUNTER — Telehealth: Payer: Self-pay | Admitting: Nurse Practitioner

## 2023-08-13 NOTE — Telephone Encounter (Signed)
She has listed hx of PAD- arterial disease in her problem list

## 2023-08-13 NOTE — Telephone Encounter (Unsigned)
Copied from CRM [id number]. Topic: General - Other >> Aug 12, 2023  4:38 PM Ja-Kwan M wrote: Reason for CRM: Alcario Drought with [facility] called to clarify the referral request from Erin Mecum if the request if for venous or arterial. Alcario Drought stated please leave this information on the voicemail if she does not answer because she is frequently away from her desk. Cb# [phone]

## 2023-08-13 NOTE — Telephone Encounter (Signed)
Copied from CRM [id number]. Topic: General - Other >> Aug 12, 2023  4:38 PM Ja-Kwan M wrote: Reason for CRM: Alcario Drought with [facility] called to clarify the referral request from Erin Mecum if the request if for venous or arterial. Alcario Drought stated please leave this information on the voicemail if she does not answer because she is frequently away from her desk. Cb# [phone]

## 2023-08-14 NOTE — Telephone Encounter (Signed)
Returned call to Cavalier with [facility] to clarify that Denny Peon would like to switch the referral to Venous to have the evaluated for Circulation Machine. Erica verbalized understanding.

## 2023-08-18 ENCOUNTER — Inpatient Hospital Stay (HOSPITAL_BASED_OUTPATIENT_CLINIC_OR_DEPARTMENT_OTHER): Payer: Medicare Other | Admitting: Oncology

## 2023-08-18 ENCOUNTER — Telehealth: Payer: Self-pay | Admitting: Cardiovascular Disease

## 2023-08-18 ENCOUNTER — Inpatient Hospital Stay: Payer: Medicare Other | Attending: Oncology

## 2023-08-18 ENCOUNTER — Encounter: Payer: Self-pay | Admitting: Oncology

## 2023-08-18 VITALS — BP 163/66 | HR 65 | Temp 96.0°F | Resp 18 | Wt 187.6 lb

## 2023-08-18 DIAGNOSIS — Z79810 Long term (current) use of selective estrogen receptor modulators (SERMs): Secondary | ICD-10-CM | POA: Insufficient documentation

## 2023-08-18 DIAGNOSIS — C7951 Secondary malignant neoplasm of bone: Secondary | ICD-10-CM | POA: Diagnosis not present

## 2023-08-18 DIAGNOSIS — C50912 Malignant neoplasm of unspecified site of left female breast: Secondary | ICD-10-CM | POA: Insufficient documentation

## 2023-08-18 DIAGNOSIS — R600 Localized edema: Secondary | ICD-10-CM | POA: Insufficient documentation

## 2023-08-18 DIAGNOSIS — C50919 Malignant neoplasm of unspecified site of unspecified female breast: Secondary | ICD-10-CM | POA: Diagnosis not present

## 2023-08-18 DIAGNOSIS — Z5181 Encounter for therapeutic drug level monitoring: Secondary | ICD-10-CM | POA: Diagnosis not present

## 2023-08-18 DIAGNOSIS — Z170 Estrogen receptor positive status [ER+]: Secondary | ICD-10-CM | POA: Diagnosis not present

## 2023-08-18 DIAGNOSIS — M810 Age-related osteoporosis without current pathological fracture: Secondary | ICD-10-CM | POA: Diagnosis not present

## 2023-08-18 LAB — CBC WITH DIFFERENTIAL/PLATELET
Abs Immature Granulocytes: 0.03 10*3/uL (ref 0.00–0.07)
Basophils Absolute: 0 10*3/uL (ref 0.0–0.1)
Basophils Relative: 1 %
Eosinophils Absolute: 0.3 10*3/uL (ref 0.0–0.5)
Eosinophils Relative: 4 %
HCT: 37.9 % (ref 36.0–46.0)
Hemoglobin: 12.4 g/dL (ref 12.0–15.0)
Immature Granulocytes: 0 %
Lymphocytes Relative: 22 %
Lymphs Abs: 1.6 10*3/uL (ref 0.7–4.0)
MCH: 29.9 pg (ref 26.0–34.0)
MCHC: 32.7 g/dL (ref 30.0–36.0)
MCV: 91.3 fL (ref 80.0–100.0)
Monocytes Absolute: 0.5 10*3/uL (ref 0.1–1.0)
Monocytes Relative: 6 %
Neutro Abs: 4.9 10*3/uL (ref 1.7–7.7)
Neutrophils Relative %: 67 %
Platelets: 192 10*3/uL (ref 150–400)
RBC: 4.15 MIL/uL (ref 3.87–5.11)
RDW: 15.7 % — ABNORMAL HIGH (ref 11.5–15.5)
WBC: 7.3 10*3/uL (ref 4.0–10.5)
nRBC: 0 % (ref 0.0–0.2)

## 2023-08-18 LAB — COMPREHENSIVE METABOLIC PANEL
ALT: 13 U/L (ref 0–44)
AST: 20 U/L (ref 15–41)
Albumin: 4.2 g/dL (ref 3.5–5.0)
Alkaline Phosphatase: 58 U/L (ref 38–126)
Anion gap: 6 (ref 5–15)
BUN: 25 mg/dL — ABNORMAL HIGH (ref 8–23)
CO2: 26 mmol/L (ref 22–32)
Calcium: 8.7 mg/dL — ABNORMAL LOW (ref 8.9–10.3)
Chloride: 107 mmol/L (ref 98–111)
Creatinine, Ser: 0.93 mg/dL (ref 0.44–1.00)
GFR, Estimated: 57 mL/min — ABNORMAL LOW (ref >60)
Glucose, Bld: 100 mg/dL — ABNORMAL HIGH (ref 70–99)
Potassium: 3.8 mmol/L (ref 3.5–5.1)
Sodium: 139 mmol/L (ref 135–145)
Total Bilirubin: 0.3 mg/dL (ref 0.3–1.2)
Total Protein: 7.3 g/dL (ref 6.5–8.1)

## 2023-08-18 NOTE — Telephone Encounter (Signed)
Called [facility]. They said they would call the patient today to get her scheduled for the referral. Called patient and notified her. Patient verbalizes understanding.

## 2023-08-18 NOTE — Progress Notes (Signed)
Hematology/Oncology Consult note [facility]  [phone] [phone]  Patient Care Team: Larae Grooms, NP as PCP - General (Nurse Practitioner) Antonieta Iba, MD as Consulting Physician (Cardiology) Ronnette Juniper as Physician Assistant (Orthopedic Surgery) Lemar Livings Merrily Pew, MD (General Surgery) Kennedy Bucker, MD as Consulting Physician (Orthopedic Surgery) Creig Hines, MD as Consulting Physician (Hematology and Oncology)   Name of the patient: Kelli Williams  [id number]  Jan 11, 1926   Date of visit: 08/18/23  Diagnosis-  metastatic ER positive breast cancer  Chief complaint/ Reason for visit-routine follow-up of breast cancer  Heme/Onc history: Patient is a 89 year old female who was previously seeing Dr. Merlene Pulling and is now transferring her care to me.  She was diagnosed with left breast cancer in August 2000 and NTU.  It was a 0.8 cm grade 2 invasive mammary carcinoma ER 50% positive PR 90% positive.  She underwent adjuvant radiation treatment and completed 5 years of tamoxifen in July 2005.  She was noted to have metastatic breast cancer on left axillary biopsy in August 2018.  Pathology showed 1.1 cm grade 2 invasive mammary carcinoma with focal lobular features.  1 out of 4 axillary lymph nodes with extracapsular extension.  Tumor greater than 90% ER/PR positive and HER2 negative by FISH.  PET scan also showed widespread hypermetabolic osseous bone lesions she has been on Faslodex since August 2018 with stable disease and most recent PET scan in December 2021 showed no evidence of recurrence or progression.  Patient was also on Xgeva previously which was given between October 2018 but stopped in May 2019 due to dental issues   Patient was switched from Faslodex to letrozole in July 2022.  She was switched to tamoxifen in January 2024  Interval history-reports increasing bilateral lower extremity edema and will be  seeing vascular surgery soon.  She finds it difficult to ambulate because of edema.  She has previously tolerated tamoxifen well in the past  ECOG PS- 3 Pain scale- 4  Review of systems- Review of Systems  Constitutional:  Positive for malaise/fatigue. Negative for chills, fever and weight loss.  HENT:  Negative for congestion, ear discharge and nosebleeds.   Eyes:  Negative for blurred vision.  Respiratory:  Negative for cough, hemoptysis, sputum production, shortness of breath and wheezing.   Cardiovascular:  Positive for leg swelling. Negative for chest pain, palpitations, orthopnea and claudication.  Gastrointestinal:  Negative for abdominal pain, blood in stool, constipation, diarrhea, heartburn, melena, nausea and vomiting.  Genitourinary:  Negative for dysuria, flank pain, frequency, hematuria and urgency.  Musculoskeletal:  Positive for back pain. Negative for joint pain and myalgias.  Skin:  Negative for rash.  Neurological:  Negative for dizziness, tingling, focal weakness, seizures, weakness and headaches.  Endo/Heme/Allergies:  Does not bruise/bleed easily.  Psychiatric/Behavioral:  Negative for depression and suicidal ideas. The patient does not have insomnia.       Allergies  Allergen Reactions   Amitriptyline Hives   Amoxicillin     Diarrhea     Black Pepper [Piper]    Ivp Dye [Iodinated Contrast Media]    Tape Other (See Comments)    Whelps - please use paper tape.   Silicone Rash    Whelps - please use paper tape.   Sulfa Antibiotics Rash     Past Medical History:  Diagnosis Date   Anxiety    Arthritis    BOTH FEET AND KNEES   Breast cancer (HCC) 2000  left breast ca with lumpectomy and rad tx 5 yr tamoxifen/Dr Constance Goltz at [facility]   Breast cancer of upper-outer quadrant of left female breast (HCC) 07/10/2017   Complication of anesthesia    WOKE UP DURING SURGERY FOR DEVIATED SEPTUM, KNEE REPLACEMENT AND BREAST LUMPECTOMY   Dysrhythmia    GERD  (gastroesophageal reflux disease)    Hypertension    Malignant neoplasm metastatic to bone (HCC) 09/23/2017   Malignant neoplasm metastatic to bone (HCC) 09/23/2017   Neuropathic pain of both legs    Neuropathy    Personal history of chemotherapy    Personal history of radiation therapy    Pneumonia 2016   Skin cancer of forehead    Skin cancer of nose      Past Surgical History:  Procedure Laterality Date   ABDOMINAL HYSTERECTOMY     AXILLARY LYMPH NODE BIOPSY Left 07/10/2017   INVASIVE MAMMARY CARCINOMA WITH FOCAL LOBULAR FEATURES.    BREAST BIOPSY Bilateral 1960   benign   BREAST LUMPECTOMY Left 2000   breast ca with rad tx   BREAST LUMPECTOMY Left 08/20/2017   BREAST LUMPECTOMY Left 08/20/2017   Procedure: left breast wide excision;  Surgeon: Earline Mayotte, MD;  Location: [facility] ORS;  Service: General;  Laterality: Left;   CATARACT EXTRACTION     left eye   CHOLECYSTECTOMY     JOINT REPLACEMENT     NASAL SEPTUM SURGERY     PARTIAL KNEE ARTHROPLASTY     right   TOTAL VAGINAL HYSTERECTOMY      Social History   Socioeconomic History   Marital status: Widowed    Spouse name: Not on file   Number of children: 3   Years of education: some college   Highest education level: 12th grade  Occupational History   Occupation: Retired  Tobacco Use   Smoking status: Never   Smokeless tobacco: Never   Tobacco comments:    smoking cessation materials not required  Vaping Use   Vaping status: Never Used  Substance and Sexual Activity   Alcohol use: No    Alcohol/week: 0.0 standard drinks of alcohol   Drug use: No   Sexual activity: Not Currently  Other Topics Concern   Not on file  Social History Narrative   Patient lives in independent living at [facility]       Social Determinants of Health   Financial Resource Strain: Low Risk  (10/22/2022)   Overall Financial Resource Strain (CARDIA)    Difficulty of Paying Living Expenses: Not hard at all  Food Insecurity:  No Food Insecurity (10/22/2022)   Hunger Vital Sign    Worried About Running Out of Food in the Last Year: Never true    Ran Out of Food in the Last Year: Never true  Transportation Needs: No Transportation Needs (10/22/2022)   PRAPARE - Administrator, Civil Service (Medical): No    Lack of Transportation (Non-Medical): No  Physical Activity: Inactive (10/22/2022)   Exercise Vital Sign    Days of Exercise per Week: 0 days    Minutes of Exercise per Session: 0 min  Stress: No Stress Concern Present (10/22/2022)   [employer] of Occupational Health - Occupational Stress Questionnaire    Feeling of Stress : Not at all  Social Connections: Socially Isolated (10/22/2022)   Social Connection and Isolation Panel [NHANES]    Frequency of Communication with Friends and Family: More than three times a week    Frequency of Social Gatherings  with Friends and Family: Once a week    Attends Religious Services: Never    Database administrator or Organizations: No    Attends Banker Meetings: Never    Marital Status: Widowed  Intimate Partner Violence: Not At Risk (10/22/2022)   Humiliation, Afraid, Rape, and Kick questionnaire    Fear of Current or Ex-Partner: No    Emotionally Abused: No    Physically Abused: No    Sexually Abused: No    Family History  Problem Relation Age of Onset   Heart disease Mother    Heart attack Paternal Uncle    Breast cancer Daughter 67       genetic negative   Cancer Paternal Grandmother      Current Outpatient Medications:    acetaminophen (TYLENOL) 650 MG CR tablet, Take by mouth., Disp: , Rfl:    aspirin 81 MG tablet, Take 1 tablet by mouth daily., Disp: , Rfl:    doxazosin (CARDURA) 1 MG tablet, Take 1 tablet (1 mg total) by mouth 2 (two) times daily., Disp: 180 tablet, Rfl: 3   ezetimibe (ZETIA) 10 MG tablet, TAKE 1 TABLET BY MOUTH DAILY, Disp: 60 tablet, Rfl: 5   gabapentin (NEURONTIN) 300 MG capsule, Take 3 capsules  (900 mg total) by mouth 2 (two) times daily., Disp: 540 capsule, Rfl: 1   lisinopril (ZESTRIL) 40 MG tablet, TAKE 1 TABLET BY MOUTH DAILY, Disp: 60 tablet, Rfl: 5   meloxicam (MOBIC) 15 MG tablet, TAKE 1 TABLET BY MOUTH DAILY, Disp: 90 tablet, Rfl: 3   Multiple Vitamins-Minerals (MULTIVITAMIN WITH MINERALS) tablet, Take 1 tablet by mouth daily., Disp: , Rfl:    omeprazole (PRILOSEC) 20 MG capsule, Take 1 capsule (20 mg total) by mouth daily., Disp: 90 capsule, Rfl: 1   tamoxifen (NOLVADEX) 20 MG tablet, TAKE 1 TABLET BY MOUTH DAILY, Disp: 100 tablet, Rfl: 2  Physical exam:  Vitals:   08/18/23 1325  BP: (!) 163/66  Pulse: 65  Resp: 18  Temp: (!) 96 F (35.6 C)  TempSrc: Tympanic  SpO2: 96%  Weight: 187 lb 9.6 oz (85.1 kg)   Physical Exam Constitutional:      Comments: Elderly frail-appearing woman who ambulates with a walker.  Appears in no acute distress  Cardiovascular:     Rate and Rhythm: Normal rate and regular rhythm.     Heart sounds: Normal heart sounds.  Pulmonary:     Effort: Pulmonary effort is normal.     Breath sounds: Normal breath sounds.  Musculoskeletal:     Right lower leg: Edema present.     Left lower leg: Edema present.  Skin:    General: Skin is warm and dry.  Neurological:     Mental Status: She is alert and oriented to person, place, and time.         Latest Ref Rng & Units 08/18/2023   12:46 PM  CMP  Glucose 70 - 99 mg/dL 161   BUN 8 - 23 mg/dL 25   Creatinine 0.96 - 1.00 mg/dL 0.45   Sodium 409 - 811 mmol/L 139   Potassium 3.5 - 5.1 mmol/L 3.8   Chloride 98 - 111 mmol/L 107   CO2 22 - 32 mmol/L 26   Calcium 8.9 - 10.3 mg/dL 8.7   Total Protein 6.5 - 8.1 g/dL 7.3   Total Bilirubin 0.3 - 1.2 mg/dL 0.3   Alkaline Phos 38 - 126 U/L 58   AST 15 - 41 U/L 20  ALT 0 - 44 U/L 13       Latest Ref Rng & Units 08/18/2023   12:46 PM  CBC  WBC 4.0 - 10.5 K/uL 7.3   Hemoglobin 12.0 - 15.0 g/dL 08.6   Hematocrit 57.8 - 46.0 % 37.9   Platelets 150 -  400 K/uL 192     Assessment and plan- Patient is a 89 y.o. female with metastatic ER positive breast cancer currently on tamoxifen.  This is a routine follow-up visit  Patient was switched to tamoxifen from letrozole in January 2024 due to worsening osteoporosis.  It is unclear if her bilateral lower extremity edema is secondary to tamoxifen.  I have asked her to hold off on taking the drug for about 2 to 3 weeks time and see if her leg edema improves any.  I will plan to get a PET CT scan in 2 months time to see how herCancer is doing.  Her last PET scan from August 2023 showed stable areas of treated bony metastatic disease but no new evidence of disease.  I will see her following the PET scan   Visit Diagnosis 1. Carcinoma of breast metastatic to bone, unspecified laterality (HCC)   2. Encounter for monitoring tamoxifen therapy      Dr. Owens Shark, MD, MPH [facility] [phone] 08/18/2023 2:59 PM

## 2023-08-18 NOTE — Telephone Encounter (Signed)
Pt daughter called in stating she has been trying to get in touch with Dr. Driscilla Grammes office, where Dr. Mariah Milling referred her but they have been unsuccessful. She asked if Dr. Mariah Milling or nurse can call them or send them a message to sch pt. Please advise.

## 2023-08-22 ENCOUNTER — Other Ambulatory Visit: Payer: Medicare Other

## 2023-08-22 ENCOUNTER — Other Ambulatory Visit: Payer: Self-pay | Admitting: Cardiovascular Disease

## 2023-09-06 ENCOUNTER — Encounter: Payer: Self-pay | Admitting: Emergency Medicine

## 2023-09-06 ENCOUNTER — Other Ambulatory Visit: Payer: Self-pay

## 2023-09-06 ENCOUNTER — Inpatient Hospital Stay
Admission: EM | Admit: 2023-09-06 | Discharge: 2023-09-09 | DRG: 378 | Disposition: A | Discharge status: Skilled Nursing Facility | Payer: Medicare Other | Source: Skilled Nursing Facility | Attending: Internal Medicine | Admitting: Internal Medicine

## 2023-09-06 ENCOUNTER — Emergency Department: Payer: Medicare Other

## 2023-09-06 DIAGNOSIS — Z79810 Long term (current) use of selective estrogen receptor modulators (SERMs): Secondary | ICD-10-CM

## 2023-09-06 DIAGNOSIS — Z882 Allergy status to sulfonamides status: Secondary | ICD-10-CM | POA: Diagnosis not present

## 2023-09-06 DIAGNOSIS — Z79899 Other long term (current) drug therapy: Secondary | ICD-10-CM

## 2023-09-06 DIAGNOSIS — Z809 Family history of malignant neoplasm, unspecified: Secondary | ICD-10-CM

## 2023-09-06 DIAGNOSIS — Z66 Do not resuscitate: Secondary | ICD-10-CM | POA: Diagnosis not present

## 2023-09-06 DIAGNOSIS — I959 Hypotension, unspecified: Secondary | ICD-10-CM | POA: Diagnosis not present

## 2023-09-06 DIAGNOSIS — C50919 Malignant neoplasm of unspecified site of unspecified female breast: Secondary | ICD-10-CM | POA: Diagnosis present

## 2023-09-06 DIAGNOSIS — K625 Hemorrhage of anus and rectum: Secondary | ICD-10-CM | POA: Diagnosis not present

## 2023-09-06 DIAGNOSIS — Z888 Allergy status to other drugs, medicaments and biological substances status: Secondary | ICD-10-CM

## 2023-09-06 DIAGNOSIS — C50412 Malignant neoplasm of upper-outer quadrant of left female breast: Secondary | ICD-10-CM | POA: Diagnosis present

## 2023-09-06 DIAGNOSIS — Z803 Family history of malignant neoplasm of breast: Secondary | ICD-10-CM

## 2023-09-06 DIAGNOSIS — I739 Peripheral vascular disease, unspecified: Secondary | ICD-10-CM | POA: Diagnosis not present

## 2023-09-06 DIAGNOSIS — Z91048 Other nonmedicinal substance allergy status: Secondary | ICD-10-CM

## 2023-09-06 DIAGNOSIS — D62 Acute posthemorrhagic anemia: Secondary | ICD-10-CM | POA: Diagnosis present

## 2023-09-06 DIAGNOSIS — K5730 Diverticulosis of large intestine without perforation or abscess without bleeding: Secondary | ICD-10-CM | POA: Diagnosis not present

## 2023-09-06 DIAGNOSIS — C7951 Secondary malignant neoplasm of bone: Secondary | ICD-10-CM | POA: Diagnosis not present

## 2023-09-06 DIAGNOSIS — Z8701 Personal history of pneumonia (recurrent): Secondary | ICD-10-CM

## 2023-09-06 DIAGNOSIS — Z90710 Acquired absence of both cervix and uterus: Secondary | ICD-10-CM | POA: Diagnosis not present

## 2023-09-06 DIAGNOSIS — Z923 Personal history of irradiation: Secondary | ICD-10-CM

## 2023-09-06 DIAGNOSIS — K922 Gastrointestinal hemorrhage, unspecified: Principal | ICD-10-CM | POA: Diagnosis present

## 2023-09-06 DIAGNOSIS — Z91018 Allergy to other foods: Secondary | ICD-10-CM | POA: Diagnosis not present

## 2023-09-06 DIAGNOSIS — Z853 Personal history of malignant neoplasm of breast: Secondary | ICD-10-CM | POA: Diagnosis not present

## 2023-09-06 DIAGNOSIS — K5731 Diverticulosis of large intestine without perforation or abscess with bleeding: Secondary | ICD-10-CM | POA: Diagnosis not present

## 2023-09-06 DIAGNOSIS — I700 Atherosclerosis of aorta: Secondary | ICD-10-CM | POA: Diagnosis not present

## 2023-09-06 DIAGNOSIS — M6281 Muscle weakness (generalized): Secondary | ICD-10-CM | POA: Diagnosis not present

## 2023-09-06 DIAGNOSIS — E785 Hyperlipidemia, unspecified: Secondary | ICD-10-CM | POA: Diagnosis present

## 2023-09-06 DIAGNOSIS — C7981 Secondary malignant neoplasm of breast: Secondary | ICD-10-CM | POA: Diagnosis present

## 2023-09-06 DIAGNOSIS — G301 Alzheimer's disease with late onset: Secondary | ICD-10-CM | POA: Diagnosis not present

## 2023-09-06 DIAGNOSIS — K219 Gastro-esophageal reflux disease without esophagitis: Secondary | ICD-10-CM | POA: Diagnosis not present

## 2023-09-06 DIAGNOSIS — Z9221 Personal history of antineoplastic chemotherapy: Secondary | ICD-10-CM | POA: Diagnosis not present

## 2023-09-06 DIAGNOSIS — Z96651 Presence of right artificial knee joint: Secondary | ICD-10-CM | POA: Diagnosis not present

## 2023-09-06 DIAGNOSIS — I10 Essential (primary) hypertension: Secondary | ICD-10-CM | POA: Diagnosis not present

## 2023-09-06 DIAGNOSIS — Z85828 Personal history of other malignant neoplasm of skin: Secondary | ICD-10-CM | POA: Diagnosis not present

## 2023-09-06 DIAGNOSIS — Z91041 Radiographic dye allergy status: Secondary | ICD-10-CM | POA: Diagnosis not present

## 2023-09-06 DIAGNOSIS — Z8249 Family history of ischemic heart disease and other diseases of the circulatory system: Secondary | ICD-10-CM

## 2023-09-06 DIAGNOSIS — R2689 Other abnormalities of gait and mobility: Secondary | ICD-10-CM | POA: Diagnosis not present

## 2023-09-06 DIAGNOSIS — G629 Polyneuropathy, unspecified: Secondary | ICD-10-CM

## 2023-09-06 DIAGNOSIS — K449 Diaphragmatic hernia without obstruction or gangrene: Secondary | ICD-10-CM | POA: Diagnosis not present

## 2023-09-06 DIAGNOSIS — R262 Difficulty in walking, not elsewhere classified: Secondary | ICD-10-CM | POA: Diagnosis not present

## 2023-09-06 DIAGNOSIS — Z9181 History of falling: Secondary | ICD-10-CM | POA: Diagnosis not present

## 2023-09-06 DIAGNOSIS — D649 Anemia, unspecified: Secondary | ICD-10-CM | POA: Diagnosis not present

## 2023-09-06 HISTORY — DX: Hyperlipidemia, unspecified: E78.5

## 2023-09-06 LAB — LACTIC ACID, PLASMA: Lactic Acid, Venous: 1.2 mmol/L (ref 0.5–1.9)

## 2023-09-06 LAB — GASTROINTESTINAL PANEL BY PCR, STOOL (REPLACES STOOL CULTURE)

## 2023-09-06 LAB — CBC WITH DIFFERENTIAL/PLATELET
Abs Immature Granulocytes: 0.01 10*3/uL (ref 0.00–0.07)
Basophils Absolute: 0 10*3/uL (ref 0.0–0.1)
Basophils Relative: 1 %
Eosinophils Absolute: 0.3 10*3/uL (ref 0.0–0.5)
Eosinophils Relative: 5 %
HCT: 34.2 % — ABNORMAL LOW (ref 36.0–46.0)
Hemoglobin: 11 g/dL — ABNORMAL LOW (ref 12.0–15.0)
Immature Granulocytes: 0 %
Lymphocytes Relative: 32 %
Lymphs Abs: 2.1 10*3/uL (ref 0.7–4.0)
MCH: 29.6 pg (ref 26.0–34.0)
MCHC: 32.2 g/dL (ref 30.0–36.0)
MCV: 91.9 fL (ref 80.0–100.0)
Monocytes Absolute: 0.5 10*3/uL (ref 0.1–1.0)
Monocytes Relative: 7 %
Neutro Abs: 3.7 10*3/uL (ref 1.7–7.7)
Neutrophils Relative %: 55 %
Platelets: 155 10*3/uL (ref 150–400)
RBC: 3.72 MIL/uL — ABNORMAL LOW (ref 3.87–5.11)
RDW: 15.5 % (ref 11.5–15.5)
WBC: 6.6 10*3/uL (ref 4.0–10.5)
nRBC: 0.3 % — ABNORMAL HIGH (ref 0.0–0.2)

## 2023-09-06 LAB — COMPREHENSIVE METABOLIC PANEL
ALT: 11 U/L (ref 0–44)
AST: 17 U/L (ref 15–41)
Albumin: 3.4 g/dL — ABNORMAL LOW (ref 3.5–5.0)
Alkaline Phosphatase: 58 U/L (ref 38–126)
Anion gap: 8 (ref 5–15)
BUN: 30 mg/dL — ABNORMAL HIGH (ref 8–23)
CO2: 24 mmol/L (ref 22–32)
Calcium: 8.5 mg/dL — ABNORMAL LOW (ref 8.9–10.3)
Chloride: 108 mmol/L (ref 98–111)
Creatinine, Ser: 0.79 mg/dL (ref 0.44–1.00)
GFR, Estimated: >60 mL/min (ref >60)
Glucose, Bld: 115 mg/dL — ABNORMAL HIGH (ref 70–99)
Potassium: 3.9 mmol/L (ref 3.5–5.1)
Sodium: 140 mmol/L (ref 135–145)
Total Bilirubin: 0.6 mg/dL (ref 0.3–1.2)
Total Protein: 6.3 g/dL — ABNORMAL LOW (ref 6.5–8.1)

## 2023-09-06 LAB — LIPASE, BLOOD: Lipase: 32 U/L (ref 11–51)

## 2023-09-06 LAB — C DIFFICILE QUICK SCREEN W PCR REFLEX
C Diff antigen: NEGATIVE
C Diff interpretation: NOT DETECTED
C Diff toxin: NEGATIVE

## 2023-09-06 LAB — HEMOGLOBIN AND HEMATOCRIT, BLOOD
HCT: 30.9 % — ABNORMAL LOW (ref 36.0–46.0)
HCT: 28.5 % — ABNORMAL LOW (ref 36.0–46.0)
Hemoglobin: 10.1 g/dL — ABNORMAL LOW (ref 12.0–15.0)
Hemoglobin: 9.6 g/dL — ABNORMAL LOW (ref 12.0–15.0)

## 2023-09-06 LAB — CBC
HCT: 31.5 % — ABNORMAL LOW (ref 36.0–46.0)
Hemoglobin: 10 g/dL — ABNORMAL LOW (ref 12.0–15.0)
MCH: 29.4 pg (ref 26.0–34.0)
MCHC: 31.7 g/dL (ref 30.0–36.0)
MCV: 92.6 fL (ref 80.0–100.0)
Platelets: 149 10*3/uL — ABNORMAL LOW (ref 150–400)
RBC: 3.4 MIL/uL — ABNORMAL LOW (ref 3.87–5.11)
RDW: 15.3 % (ref 11.5–15.5)
WBC: 9.7 10*3/uL (ref 4.0–10.5)
nRBC: 0 % (ref 0.0–0.2)

## 2023-09-06 LAB — PROTIME-INR
INR: 1.1 (ref 0.8–1.2)
Prothrombin Time: 14.4 s (ref 11.4–15.2)

## 2023-09-06 LAB — TYPE AND SCREEN
ABO/RH(D): O POS
Antibody Screen: NEGATIVE

## 2023-09-06 MED ORDER — DOXAZOSIN MESYLATE 1 MG PO TABS
1.0000 mg | ORAL_TABLET | Freq: Two times a day (BID) | ORAL | Status: DC
  Administered 2023-09-06 – 2023-09-09 (×6): 1 mg via ORAL
  Filled 2023-09-06 (×6): qty 1

## 2023-09-06 MED ORDER — LOPERAMIDE HCL 2 MG PO CAPS: 4.0000 mg | ORAL_CAPSULE | Freq: Once | ORAL | Status: DC

## 2023-09-06 MED ORDER — TRANEXAMIC ACID-NACL 1000-0.7 MG/100ML-% IV SOLN
1000.0000 mg | Freq: Once | INTRAVENOUS | Status: AC
  Administered 2023-09-06: 1000 mg via INTRAVENOUS
  Filled 2023-09-06: qty 100

## 2023-09-06 MED ORDER — IOHEXOL 350 MG/ML SOLN
100.0000 mL | Freq: Once | INTRAVENOUS | Status: AC | PRN
  Administered 2023-09-06: 100 mL via INTRAVENOUS

## 2023-09-06 MED ORDER — NITROGLYCERIN IN D5W 200-5 MCG/ML-% IV SOLN
0.0000 ug/min | INTRAVENOUS | Status: DC
  Administered 2023-09-06: 5 ug/min via INTRAVENOUS
  Filled 2023-09-06: qty 250

## 2023-09-06 MED ORDER — PANTOPRAZOLE INFUSION (NEW) - SIMPLE MED
8.0000 mg/h | INTRAVENOUS | Status: DC
  Administered 2023-09-06 – 2023-09-07 (×4): 8 mg/h via INTRAVENOUS
  Filled 2023-09-06 (×5): qty 100

## 2023-09-06 MED ORDER — LISINOPRIL 10 MG PO TABS
40.0000 mg | ORAL_TABLET | Freq: Once | ORAL | Status: AC
  Administered 2023-09-06: 40 mg via ORAL
  Filled 2023-09-06: qty 4

## 2023-09-06 MED ORDER — INFLUENZA VAC A&B SURF ANT ADJ 0.5 ML IM SUSY
0.5000 mL | PREFILLED_SYRINGE | Freq: Once | INTRAMUSCULAR | Status: DC
  Filled 2023-09-06: qty 0.5

## 2023-09-06 MED ORDER — PANTOPRAZOLE SODIUM 40 MG IV SOLR: 40.0000 mg | Freq: Two times a day (BID) | INTRAVENOUS | Status: DC

## 2023-09-06 MED ORDER — ACETAMINOPHEN 325 MG PO TABS
650.0000 mg | ORAL_TABLET | Freq: Four times a day (QID) | ORAL | Status: DC | PRN
  Administered 2023-09-06 – 2023-09-08 (×4): 650 mg via ORAL
  Filled 2023-09-06 (×4): qty 2

## 2023-09-06 MED ORDER — DOXAZOSIN MESYLATE 1 MG PO TABS
1.0000 mg | ORAL_TABLET | Freq: Once | ORAL | Status: AC
  Administered 2023-09-06: 1 mg via ORAL
  Filled 2023-09-06: qty 1

## 2023-09-06 MED ORDER — EZETIMIBE 10 MG PO TABS
10.0000 mg | ORAL_TABLET | Freq: Every day | ORAL | Status: DC
  Administered 2023-09-07 – 2023-09-09 (×3): 10 mg via ORAL
  Filled 2023-09-06 (×3): qty 1

## 2023-09-06 MED ORDER — GABAPENTIN 100 MG PO CAPS
200.0000 mg | ORAL_CAPSULE | Freq: Two times a day (BID) | ORAL | Status: DC
  Administered 2023-09-06 – 2023-09-09 (×6): 200 mg via ORAL
  Filled 2023-09-06 (×6): qty 2

## 2023-09-06 MED ORDER — METHYLPREDNISOLONE SODIUM SUCC 40 MG IJ SOLR
40.0000 mg | Freq: Once | INTRAMUSCULAR | Status: AC
  Administered 2023-09-06: 40 mg via INTRAVENOUS
  Filled 2023-09-06: qty 1

## 2023-09-06 MED ORDER — TAMOXIFEN CITRATE 10 MG PO TABS
20.0000 mg | ORAL_TABLET | Freq: Every day | ORAL | Status: DC
  Filled 2023-09-06: qty 2

## 2023-09-06 MED ORDER — DIPHENHYDRAMINE HCL 50 MG/ML IJ SOLN
50.0000 mg | Freq: Once | INTRAMUSCULAR | Status: AC
  Administered 2023-09-06: 50 mg via INTRAVENOUS
  Filled 2023-09-06: qty 1

## 2023-09-06 MED ORDER — HYDRALAZINE HCL 20 MG/ML IJ SOLN
5.0000 mg | Freq: Four times a day (QID) | INTRAMUSCULAR | Status: DC | PRN
  Administered 2023-09-07 – 2023-09-08 (×2): 5 mg via INTRAVENOUS
  Filled 2023-09-06 (×2): qty 1

## 2023-09-06 MED ORDER — LIDOCAINE 5 % EX PTCH
1.0000 | MEDICATED_PATCH | CUTANEOUS | Status: DC
  Administered 2023-09-06 – 2023-09-08 (×3): 1 via TRANSDERMAL
  Filled 2023-09-06 (×4): qty 1

## 2023-09-06 MED ORDER — DIPHENHYDRAMINE HCL 25 MG PO CAPS: 50.0000 mg | ORAL_CAPSULE | Freq: Once | ORAL | Status: AC

## 2023-09-06 MED ORDER — PANTOPRAZOLE 80MG IVPB - SIMPLE MED
80.0000 mg | Freq: Once | INTRAVENOUS | Status: AC
  Administered 2023-09-06: 80 mg via INTRAVENOUS
  Filled 2023-09-06: qty 100

## 2023-09-06 NOTE — Assessment & Plan Note (Signed)
Hypertension Patient has had episodes of uncontrolled hypertension up to the 200s systolic Previously on nitro drip in the ED had to be restarted will stop now given SBP in 130s and will use as needed hydralazine only for sustained systolic greater than 160 mmHg Favor liberal control in the setting of ongoing GI bleed Will hold lisinopril and continue the patient's Cardura as long as she remains hemodynamically stable

## 2023-09-06 NOTE — ED Notes (Signed)
Nitroglycerin paused due to pt blood pressure. ERP notified. Family at bedside.

## 2023-09-06 NOTE — ED Triage Notes (Signed)
Pt in from [place] at [place] via [facility] for rectal bleeding. Pt states she noticed black stools at 9pm last night, had 2 more occurrences then turning to bright red, prompting her to call EMS. No thinners, VSS en route, does feel lightheaded. Hx of Breast CA with mets to bone and hips. Not currently under chemo trx. VS en route: 160/89 95%RA 82HR

## 2023-09-06 NOTE — Plan of Care (Signed)
  Problem: Education: Goal: Knowledge of General Education information will improve Description: Including pain rating scale, medication(s)/side effects and non-pharmacologic comfort measures Outcome: Progressing  [place] Patient Guide given upon admission to the pt

## 2023-09-06 NOTE — ED Notes (Signed)
Pt having continuous dark red loose stool, ERP informed. PT cleaned up.

## 2023-09-06 NOTE — Assessment & Plan Note (Signed)
Gastrointestinal bleed, suspect lower source Patient reports she has had large amounts of bright red blood per rectum with clots since yesterday evening accompanied with intermittent right lower quadrant pain Will cover for potential brisk upper GI source with PPI drip On-call GI Dr. Tobi Bastos consulted by ED who reports will evaluate patient's course as well as ability to tolerate a procedure before deciding on any endoscopy Serial hemoglobin, maintain large-bore IV access x 2, GI consultation, IV PPI

## 2023-09-06 NOTE — Assessment & Plan Note (Signed)
   Aortic atherosclerosis Proximal SMA occlusive disease, patent distally. Celiac axis and IMA remain patent. Cannot tolerate anticoagulation at this time CTA mentions patency distally, lactic acid of 1.2 reassuring this is not a active intestinal ischemia situation Past medical records reviewed and summarized patient has a cardiology note from August 08, 2023 where this atherosclerosis of the aorta was no Will keep the patient volume replete and in the setting of any ongoing pain consider transfusion if there is any perfusion dependent stenosis Of note appendix not identified with her right lower quadrant discomfort CT unlikely acute appendicitis

## 2023-09-06 NOTE — ED Notes (Signed)
Pt cleaned up and linens changed. Family at bedside. Pt continuing to have dark red bloody stool. Pt urinated x1 in a bed pan. Family at bedside and updated at this time

## 2023-09-06 NOTE — Assessment & Plan Note (Signed)
Neuropathy Holding home meloxicam and decreasing home Neurontin 900 mg twice daily to 300 mg twice daily given is on the beers list and the patient's age

## 2023-09-06 NOTE — Progress Notes (Signed)
Upon admission, the pt and her daugher, Kelli Williams advised me that the pt has been on Tamoxifen and she is having headaches.  Dr Smith Robert advised the pt to stop taking the medication, Tamoxifen for 2 weeks and follow up with her to see how the headaches are.  She already has a follow-up appointment with Dr Smith Robert in October 2024.  Today is the first day that she will not take this medication.

## 2023-09-06 NOTE — ED Notes (Signed)
Pt have dark red loose liquid stool, RN cleaned pt up. Brief placed.

## 2023-09-06 NOTE — ED Notes (Signed)
Pt reports lower abdominal pain, pt states she does not want any pain medicine at this time

## 2023-09-06 NOTE — ED Notes (Signed)
Pt cleaned up, linens changed and warm blanket applied. Cha[place]in called and at bedside per family request.

## 2023-09-06 NOTE — H&P (Addendum)
History and Physical   Patient: Kelli Williams ZOX:[id number] DOB: 1926/09/12 DOA: 09/06/2023 DOS: the patient was seen and examined on 09/06/2023 PCP: Larae Grooms, NP  Patient coming from: Home  Chief Complaint: GI Bleed  HPI: 89 year old female who lives in an independent living facility presents with a past medical history of metastatic breast cancer hyperlipidemia and aortic atherosclerosis who presents to the emergency department reporting bright red blood per rectum since late last night.  She denies any melena only hematochezia denies any nausea vomiting but does report intermittent abdominal pain over the last month or so.  Of note she had left lower quadrant pain for weeks resulting in her burning herself as she was trying to use a heating pad to help with the pain, this is now since healing stage without any signs of infection.  She reports today that she has right lower quadrant discomfort intermittently.  Patient denies any neurological symptoms headache or chest pain at this time.  Patient has 2 family members at the bedside including daughter Efraim Kaufmann who is a Engineer, civil (consulting) and we had a prolonged goals of care discussion including full range of aggressive care versus comfort care patient is clear she is DNR. Patient and multiple family members clear at the bedside the patient is DO NOT RESUSCITATE however not comfort measures at this time they are okay with blood transfusion and regarding endoscopy would like to do shared decision making before making a decision regarding this if it becomes indicated.  Prolonged discussion also including the chaplain Melissa at the bedside  Review of Systems:  Constitutional: Denies Weight Loss, Fever, Chills or Night Sweats Respiratory: Denies Shortness of Breath, Cough, Hemoptysis, Wheezing, Pleurisy Cardiovascular: Denies Chest Pain, Paroxsymal Nocturnal Dyspnea, Palpitations, Edema Gastrointestinal: Reports Diarrhea,  Melena but no melena or  hematemesis All other systems were reviewed and are negative  Past Medical History:  Diagnosis Date   Anxiety    Arthritis    BOTH FEET AND KNEES   Breast cancer (HCC) 2000   left breast ca with lumpectomy and rad tx 5 yr tamoxifen/Dr Constance Goltz at [facility]   Breast cancer of upper-outer quadrant of left female breast (HCC) 07/10/2017   Complication of anesthesia    WOKE UP DURING SURGERY FOR DEVIATED SEPTUM, KNEE REPLACEMENT AND BREAST LUMPECTOMY   Dysrhythmia    GERD (gastroesophageal reflux disease)    Hyperlipidemia 09/06/2023   Hypertension    Malignant neoplasm metastatic to bone (HCC) 09/23/2017   Malignant neoplasm metastatic to bone (HCC) 09/23/2017   Neuropathic pain of both legs    Neuropathy    Personal history of chemotherapy    Personal history of radiation therapy    Pneumonia 2016   Skin cancer of forehead    Skin cancer of nose    Past Surgical History:  Procedure Laterality Date   ABDOMINAL HYSTERECTOMY     AXILLARY LYMPH NODE BIOPSY Left 07/10/2017   INVASIVE MAMMARY CARCINOMA WITH FOCAL LOBULAR FEATURES.    BREAST BIOPSY Bilateral 1960   benign   BREAST LUMPECTOMY Left 2000   breast ca with rad tx   BREAST LUMPECTOMY Left 08/20/2017   BREAST LUMPECTOMY Left 08/20/2017   Procedure: left breast wide excision;  Surgeon: Earline Mayotte, MD;  Location: [facility] ORS;  Service: General;  Laterality: Left;   CATARACT EXTRACTION     left eye   CHOLECYSTECTOMY     JOINT REPLACEMENT     NASAL SEPTUM SURGERY     PARTIAL KNEE ARTHROPLASTY  right   TOTAL VAGINAL HYSTERECTOMY     Social History:  reports that she has never smoked. She has never used smokeless tobacco. She reports that she does not drink alcohol and does not use drugs.  Allergies  Allergen Reactions   Amitriptyline Hives   Amoxicillin     Diarrhea     Black Pepper [Piper]    Ivp Dye [Iodinated Contrast Media]    Tape Other (See Comments)    Whelps - please use paper tape.   Silicone Rash     Whelps - please use paper tape.   Sulfa Antibiotics Rash    Family History  Problem Relation Age of Onset   Heart disease Mother    Heart attack Paternal Uncle    Breast cancer Daughter 41       genetic negative   Cancer Paternal Grandmother     Prior to Admission medications   Medication Sig Start Date End Date Taking? Authorizing Provider  doxazosin (CARDURA) 1 MG tablet TAKE 1 TABLET BY MOUTH TWICE  DAILY 08/25/23   Antonieta Iba, MD  ezetimibe (ZETIA) 10 MG tablet TAKE 1 TABLET BY MOUTH DAILY 07/04/23   Antonieta Iba, MD  gabapentin (NEURONTIN) 300 MG capsule Take 3 capsules (900 mg total) by mouth 2 (two) times daily. 05/07/23   Larae Grooms, NP  tamoxifen (NOLVADEX) 20 MG tablet TAKE 1 TABLET BY MOUTH DAILY 02/21/23   Alinda Dooms, NP    Physical Exam: Vitals:   09/06/23 1400 09/06/23 1500 09/06/23 1515 09/06/23 1530  BP: (!) 148/83 (!) 147/76 (!) 145/77 130/75  Pulse: 86 88 87 94  Resp: 16 (!) 21 (!) 22 16  Temp:      TempSrc:      SpO2: 96% 96% 99% 97%  Weight:      Patient seen approx 2:45 room 24 ER Constitutional:  Vital Signs as per [facility] than three noted] No Acute Distress Eyes:  Pink Conjunctiva and no Ptosis Neck:     Trachea Midline, Neck Symmetric             Thyroid without tenderness, palpable masses or nodules Respiratory:   Respiratory Effort Normal: No Use of Respiratory Muscles,No  Intercostal Retractions             Lungs Clear to Auscultation Bilaterally Cardiovascular:   Heart Auscultated: Regular Regular without any added sounds or murmurs              Lower Extremity Edema Gastrointestinal:  Abdomen soft with tenderness RLQ without palpable masses, guarding or rebound  No Palpable Splenomegaly or Hepatomegaly LLQ excoriation , no surrounding infection (sequale of healing infection) Psychiatric:  Patient Orientated to Time, Place and Person   Data Reviewed: Labs as per A/P  Assessment and Plan: * GI  bleed Gastrointestinal bleed, suspect lower source Patient reports she has had large amounts of bright red blood per rectum with clots since yesterday evening accompanied with intermittent right lower quadrant pain Will cover for potential brisk upper GI source with PPI drip On-call GI Dr. Tobi Bastos consulted by ED who reports will evaluate patient's course as well as ability to tolerate a procedure before deciding on any endoscopy Serial hemoglobin, maintain large-bore IV access x 2, GI consultation, IV PPI  Neuropathy Neuropathy Holding home meloxicam and decreasing home Neurontin 900 mg twice daily to 300 mg twice daily given is on the beers list and the patient's age  Hyperlipidemia  Hyperlipidemia Continue the patient's ezetimibe  Acute blood loss anemia Acute blood loss anemia, secondary to GI bleed Currently no evidence of endorgan ischemia Presenting hemoglobin was 11 down to 10 mg/dL In the setting of ongoing GI bleed Serial hemoglobin and transfuse as needed based off of symptoms and serious consideration of less than 7 mg/dL  Aortic atherosclerosis (HCC)   Aortic atherosclerosis Proximal SMA occlusive disease, patent distally. Celiac axis and IMA remain patent. Cannot tolerate anticoagulation at this time CTA mentions patency distally, lactic acid of 1.2 reassuring this is not a active intestinal ischemia situation Past medical records reviewed and summarized patient has a cardiology note from August 08, 2023 where this atherosclerosis of the aorta was no Will keep the patient volume replete and in the setting of any ongoing pain consider transfusion if there is any perfusion dependent stenosis Of note appendix not identified with her right lower quadrant discomfort CT unlikely acute appendicitis   Breast cancer (HCC)  Metastatic breast cancer Continue the patient's home tamoxifen   Essential hypertension Hypertension Patient has had episodes of uncontrolled  hypertension up to the 200s systolic Previously on nitro drip in the ED had to be restarted will stop now given SBP in 130s and will use as needed hydralazine only for sustained systolic greater than 160 mmHg Favor liberal control in the setting of ongoing GI bleed Will hold lisinopril and continue the patient's Cardura as long as she remains hemodynamically stable     Advance Care Planning:   Code Status: Limited: Do not attempt resuscitation (DNR) -DNR-LIMITED -Do Not Intubate/DNI     Patient and multiple family members clear at the bedside the patient is DO NOT RESUSCITATE however not comfort measures at this time they are okay with blood transfusion and regarding endoscopy would like to do shared decision making before making a decision regarding this if it becomes indicated.  Prolonged discussion also including the chaplain Melissa at the bedside  Consults: GI [Contacted by ED]  Family Communication: Daughter at bedside  Severity of Illness: The appropriate patient status for this patient is INPATIENT. Inpatient status is judged to be reasonable and necessary in order to provide the required intensity of service to ensure the patient's safety. The patient's presenting symptoms, physical exam findings, and initial radiographic and laboratory data in the context of their chronic comorbidities is felt to place them at high risk for further clinical deterioration. Furthermore, it is not anticipated that the patient will be medically stable for discharge from the hospital within 2 midnights of admission.   * I certify that at the point of admission it is my clinical judgment that the patient will require inpatient hospital care spanning beyond 2 midnights from the point of admission due to high intensity of service, high risk for further deterioration and high frequency of surveillance required.*  5:33 PM Nurse notified patient daughter said Dr. Smith Robert at cancer center told them at last OPD to  stop tamoxifen Med discontinued  Author: Princess Bruins, MD 09/06/2023 4:02 PM  For on call review [email or web address].

## 2023-09-06 NOTE — Assessment & Plan Note (Signed)
  Hyperlipidemia Continue the patient's ezetimibe

## 2023-09-06 NOTE — Assessment & Plan Note (Signed)
Acute blood loss anemia, secondary to GI bleed Currently no evidence of endorgan ischemia Presenting hemoglobin was 11 down to 10 mg/dL In the setting of ongoing GI bleed Serial hemoglobin and transfuse as needed based off of symptoms and serious consideration of less than 7 mg/dL

## 2023-09-06 NOTE — ED Notes (Signed)
Pt having bloody stool around rectal tube, pt urinated in bed. RN change linens.

## 2023-09-06 NOTE — ED Notes (Signed)
Pt continous to have dark red blood stool, ERP okay for rectal tube.

## 2023-09-06 NOTE — ED Notes (Signed)
Pt back from CT, sleeping.

## 2023-09-06 NOTE — ED Notes (Signed)
Rectal tube placed by Hailey, rn.

## 2023-09-06 NOTE — Progress Notes (Signed)
Upon arrival Kelli Williams was alert and able to discuss her wishes. She currently lives alone in a retirement community. She enjoys her windows and 40 plants. She is supported by 3 daughters. One of whom live about an hour away, received training as a Orthoptist. Both understand her decline and recognize more support will be needed. Kelli Williams and family appreciate conversation with physician regarding her reality. Kelli Williams shared "I never thought I would die this way. I was hoping it would be in my sleep." Chaplain validated Kelli Williams's truth and offered prayer. Spiritual care services continue to be available upon request.     09/06/23 1500  Spiritual Encounters  Type of Visit Initial  Care provided to: Pt and family  Conversation partners present during encounter Physician;Nurse  Referral source Nurse (RN/NT/LPN)  Reason for visit Goals of care meeting  OnCall Visit Yes

## 2023-09-06 NOTE — ED Provider Notes (Signed)
[facility] Provider Note   Event Date/Time   First MD Initiated Contact with Patient 09/06/23 [phone]     (approximate) History  No chief complaint on file.  HPI Kelli Williams is a 89 y.o. female with a past medical history of metastatic breast cancer, lower extremity neuropathy, and hypertension who presents complaining of bright rectal bleeding with clots since 9 PM last night.  Patient denies any dark tarry stools.  Patient endorses "abdominal sensations" but they are not pain that feel like the neuropathy in her legs.  Patient states she is currently on tamoxifen for treatment for her metastatic breast cancer.  Patient denies any personal or family history of colon cancer. ROS: Patient currently denies any vision changes, tinnitus, difficulty speaking, facial droop, sore throat, chest pain, shortness of breath, abdominal pain, nausea/vomiting, dysuria, or weakness/numbness/paresthesias in any extremity   Physical Exam  Triage Vital Signs: ED Triage Vitals  Encounter Vitals Group     BP 09/06/23 0700 (!) 189/83     Systolic BP Percentile --      Diastolic BP Percentile --      Pulse Rate 09/06/23 0700 64     Resp 09/06/23 0705 18     Temp 09/06/23 0658 98.3 F (36.8 C)     Temp Source 09/06/23 0658 Oral     SpO2 09/06/23 0700 98 %     Weight 09/06/23 0659 187 lb 9.6 oz (85.1 kg)     Height --      Head Circumference --      Peak Flow --      Pain Score 09/06/23 0659 0     Pain Loc --      Pain Education --      Exclude from Growth Chart --    Most recent vital signs: Vitals:   09/09/23 0743 09/09/23 1100  BP:  (!) 142/56  Pulse: 62   Resp: 16   Temp: 98.6 F (37 C)   SpO2: 94%    General: Awake, oriented x4. CV:  Good peripheral perfusion.  Resp:  Normal effort.  Abd:  No distention.  Other:  Elderly well-developed, well-nourished Caucasian female resting comfortably in no acute distress ED Results / Procedures / Treatments  Labs (all  labs ordered are listed, but only abnormal results are displayed) Labs Reviewed  COMPREHENSIVE METABOLIC PANEL - Abnormal; Notable for the following components:      Result Value   Glucose, Bld 115 (*)    BUN 30 (*)    Calcium 8.5 (*)    Total Protein 6.3 (*)    Albumin 3.4 (*)    All other components within normal limits  CBC WITH DIFFERENTIAL/PLATELET - Abnormal; Notable for the following components:   RBC 3.72 (*)    Hemoglobin 11.0 (*)    HCT 34.2 (*)    nRBC 0.3 (*)    All other components within normal limits  CBC - Abnormal; Notable for the following components:   RBC 3.40 (*)    Hemoglobin 10.0 (*)    HCT 31.5 (*)    Platelets 149 (*)    All other components within normal limits  HEMOGLOBIN AND HEMATOCRIT, BLOOD - Abnormal; Notable for the following components:   Hemoglobin 10.1 (*)    HCT 30.9 (*)    All other components within normal limits  HEMOGLOBIN AND HEMATOCRIT, BLOOD - Abnormal; Notable for the following components:   Hemoglobin 9.6 (*)    HCT 28.5 (*)  All other components within normal limits  CBC - Abnormal; Notable for the following components:   RBC 2.94 (*)    Hemoglobin 8.8 (*)    HCT 26.4 (*)    Platelets 144 (*)    All other components within normal limits  COMPREHENSIVE METABOLIC PANEL - Abnormal; Notable for the following components:   BUN 25 (*)    Calcium 8.2 (*)    Total Protein 5.2 (*)    Albumin 3.0 (*)    Alkaline Phosphatase 37 (*)    All other components within normal limits  CBC WITH DIFFERENTIAL/PLATELET - Abnormal; Notable for the following components:   RBC 2.89 (*)    Hemoglobin 8.7 (*)    HCT 25.8 (*)    RDW 15.6 (*)    Platelets 133 (*)    All other components within normal limits  CBC WITH DIFFERENTIAL/PLATELET - Abnormal; Notable for the following components:   RBC 2.89 (*)    Hemoglobin 8.7 (*)    HCT 25.4 (*)    Platelets 140 (*)    All other components within normal limits  BASIC METABOLIC PANEL - Abnormal;  Notable for the following components:   Potassium 3.4 (*)    Calcium 8.1 (*)    All other components within normal limits  CBC WITH DIFFERENTIAL/PLATELET - Abnormal; Notable for the following components:   RBC 2.87 (*)    Hemoglobin 8.6 (*)    HCT 25.7 (*)    RDW 15.6 (*)    Platelets 149 (*)    All other components within normal limits  BASIC METABOLIC PANEL - Abnormal; Notable for the following components:   Glucose, Bld 105 (*)    Calcium 8.1 (*)    Anion gap 4 (*)    All other components within normal limits  C DIFFICILE QUICK SCREEN W PCR REFLEX    GASTROINTESTINAL PANEL BY PCR, STOOL (REPLACES STOOL CULTURE)  PROTIME-INR  LACTIC ACID, PLASMA  LIPASE, BLOOD  TYPE AND SCREEN   RADIOLOGY ED MD interpretation: CT angiography of the abdomen/pelvis shows no evidence of acute abnormalities. -Agree with radiology assessment Official radiology report(s): No results found. PROCEDURES: Critical Care performed: Yes, see critical care procedure note(s) .1-3 Lead EKG Interpretation  Performed by: Merwyn Katos, MD Authorized by: Merwyn Katos, MD     Interpretation: normal     ECG rate:  71   ECG rate assessment: normal     Rhythm: sinus rhythm     Ectopy: none     Conduction: normal   CRITICAL CARE Performed by: Merwyn Katos  Total critical care time: 51 minutes  Critical care time was exclusive of separately billable procedures and treating other patients.  Critical care was necessary to treat or prevent imminent or life-threatening deterioration.  Critical care was time spent personally by me on the following activities: development of treatment plan with patient and/or surrogate as well as nursing, discussions with consultants, evaluation of patient's response to treatment, examination of patient, obtaining history from patient or surrogate, ordering and performing treatments and interventions, ordering and review of laboratory studies, ordering and review of  radiographic studies, pulse oximetry and re-evaluation of patient's condition.  MEDICATIONS ORDERED IN ED: Medications  doxazosin (CARDURA) tablet 1 mg (1 mg Oral Given 09/06/23 0817)  lisinopril (ZESTRIL) tablet 40 mg (40 mg Oral Given 09/06/23 0731)  methylPREDNISolone sodium succinate (SOLU-MEDROL) 40 mg/mL injection 40 mg (40 mg Intravenous Given 09/06/23 0737)  diphenhydrAMINE (BENADRYL) capsule 50 mg (  Oral See Alternative 09/06/23 1035)    Or  diphenhydrAMINE (BENADRYL) injection 50 mg (50 mg Intravenous Given 09/06/23 1035)  tranexamic acid (CYKLOKAPRON) IVPB 1,000 mg (0 mg Intravenous Stopped 09/06/23 0900)  iohexol (OMNIPAQUE) 350 MG/ML injection 100 mL (100 mLs Intravenous Contrast Given 09/06/23 1207)  pantoprazole (PROTONIX) 80 mg /NS 100 mL IVPB (0 mg Intravenous Stopped 09/06/23 1618)  potassium chloride (KLOR-CON) packet 40 mEq (40 mEq Oral Given 09/08/23 1436)   IMPRESSION / MDM / ASSESSMENT AND PLAN / ED COURSE  I reviewed the triage vital signs and the nursing notes.                             The patient is on the cardiac monitor to evaluate for evidence of arrhythmia and/or significant heart rate changes. Patient's presentation is most consistent with acute presentation with potential threat to life or bodily function. + abdominal pain + black stool per rectum Given history and exam patient's presentation most consistent with brisk lower GI bleed  I have low suspicion for aortoenteric fistula, ENT bleeding mimic, Boerhaave's, Pulmonary bleeding mimic.  Workup: CBC, BMP, LFTs, Lipase, PT/INR, Type and Screen  Interventions: Analgesia and antiemetic medications PRN Protonix 40mg  IVP PRBC transfusion PRN  Findings: Hb: 8.6  Disposition: Admit for close monitoring.   FINAL CLINICAL IMPRESSION(S) / ED DIAGNOSES   Final diagnoses:  Acute GI bleeding   Rx / DC Orders   ED Discharge Orders     None      Note:  This document was prepared using Dragon voice  recognition software and may include unintentional dictation errors.   Merwyn Katos, MD 09/15/23 [id number]

## 2023-09-06 NOTE — ED Notes (Signed)
Pt cleaned up.  

## 2023-09-06 NOTE — ED Notes (Signed)
Pt cleaned up dark red liquid stool and x1 urine. Pt given warm blankets and repositioned in bed after new chucks and brief placed.

## 2023-09-06 NOTE — Assessment & Plan Note (Signed)
  Metastatic breast cancer Continue the patient's home tamoxifen

## 2023-09-07 DIAGNOSIS — K625 Hemorrhage of anus and rectum: Secondary | ICD-10-CM

## 2023-09-07 DIAGNOSIS — K922 Gastrointestinal hemorrhage, unspecified: Secondary | ICD-10-CM | POA: Diagnosis not present

## 2023-09-07 LAB — CBC WITH DIFFERENTIAL/PLATELET
Abs Immature Granulocytes: 0.02 10*3/uL (ref 0.00–0.07)
Basophils Absolute: 0 10*3/uL (ref 0.0–0.1)
Basophils Relative: 1 %
Eosinophils Absolute: 0.1 10*3/uL (ref 0.0–0.5)
Eosinophils Relative: 2 %
HCT: 25.8 % — ABNORMAL LOW (ref 36.0–46.0)
Hemoglobin: 8.7 g/dL — ABNORMAL LOW (ref 12.0–15.0)
Immature Granulocytes: 0 %
Lymphocytes Relative: 21 %
Lymphs Abs: 1.6 10*3/uL (ref 0.7–4.0)
MCH: 30.1 pg (ref 26.0–34.0)
MCHC: 33.7 g/dL (ref 30.0–36.0)
MCV: 89.3 fL (ref 80.0–100.0)
Monocytes Absolute: 0.4 10*3/uL (ref 0.1–1.0)
Monocytes Relative: 5 %
Neutro Abs: 5.3 10*3/uL (ref 1.7–7.7)
Neutrophils Relative %: 71 %
Platelets: 133 10*3/uL — ABNORMAL LOW (ref 150–400)
RBC: 2.89 MIL/uL — ABNORMAL LOW (ref 3.87–5.11)
RDW: 15.6 % — ABNORMAL HIGH (ref 11.5–15.5)
WBC: 7.4 10*3/uL (ref 4.0–10.5)
nRBC: 0 % (ref 0.0–0.2)

## 2023-09-07 LAB — CBC
HCT: 26.4 % — ABNORMAL LOW (ref 36.0–46.0)
Hemoglobin: 8.8 g/dL — ABNORMAL LOW (ref 12.0–15.0)
MCH: 29.9 pg (ref 26.0–34.0)
MCHC: 33.3 g/dL (ref 30.0–36.0)
MCV: 89.8 fL (ref 80.0–100.0)
Platelets: 144 10*3/uL — ABNORMAL LOW (ref 150–400)
RBC: 2.94 MIL/uL — ABNORMAL LOW (ref 3.87–5.11)
RDW: 15.4 % (ref 11.5–15.5)
WBC: 6.5 10*3/uL (ref 4.0–10.5)
nRBC: 0 % (ref 0.0–0.2)

## 2023-09-07 LAB — COMPREHENSIVE METABOLIC PANEL
ALT: 11 U/L (ref 0–44)
AST: 16 U/L (ref 15–41)
Albumin: 3 g/dL — ABNORMAL LOW (ref 3.5–5.0)
Alkaline Phosphatase: 37 U/L — ABNORMAL LOW (ref 38–126)
Anion gap: 6 (ref 5–15)
BUN: 25 mg/dL — ABNORMAL HIGH (ref 8–23)
CO2: 25 mmol/L (ref 22–32)
Calcium: 8.2 mg/dL — ABNORMAL LOW (ref 8.9–10.3)
Chloride: 108 mmol/L (ref 98–111)
Creatinine, Ser: 0.79 mg/dL (ref 0.44–1.00)
GFR, Estimated: >60 mL/min (ref >60)
Glucose, Bld: 97 mg/dL (ref 70–99)
Potassium: 3.5 mmol/L (ref 3.5–5.1)
Sodium: 139 mmol/L (ref 135–145)
Total Bilirubin: 0.7 mg/dL (ref 0.3–1.2)
Total Protein: 5.2 g/dL — ABNORMAL LOW (ref 6.5–8.1)

## 2023-09-07 NOTE — Evaluation (Signed)
Physical Therapy Evaluation Patient Details Name: Kelli Williams MRN: [id number] DOB: 09/20/1926 Today's Date: 09/07/2023  History of Present Illness  Pt is a 89 y.o. female presenting to hospital 09/06/23 with c/o bright red blood per rectum.  Recent burn--using heating pad for LLQ pain.  Pt admitted with GI bleed.  PMH includes metastatic breast CA, HLD, aortic atherosclerosis, anxiety, neuropathic pain B LE's, R partial knee arthroplasty.  Clinical Impression  Prior to recent medical concerns, pt was modified independent ambulating with rollator; lives at [facility].  Currently pt is min to mod assist with bed mobility; mod assist with transfers using RW; and min to mod assist to side step to R along bed a couple feet (using RW).  Limited activity d/t pt fatigue and generalized weakness; pt reporting dizziness in standing that resolved with rest in bed; pt appearing unsteady with standing activities requiring assist for balance.  Nurse updated on pt's status.  Pt would currently benefit from skilled PT to address noted impairments and functional limitations (see below for any additional details).  Upon hospital discharge, pt would benefit from ongoing therapy.     If plan is discharge home, recommend the following: Two people to help with walking and/or transfers;A lot of help with bathing/dressing/bathroom;Assistance with cooking/housework;Assist for transportation;Help with stairs or ramp for entrance   Can travel by private vehicle   No    Equipment Recommendations Other (comment) (TBD at next facility)  Recommendations for Other Services       Functional Status Assessment Patient has had a recent decline in their functional status and demonstrates the ability to make significant improvements in function in a reasonable and predictable amount of time.     Precautions / Restrictions Precautions Precautions: Fall Restrictions Weight Bearing Restrictions:  No      Mobility  Bed Mobility Overal bed mobility: Needs Assistance Bed Mobility: Supine to Sit, Sit to Supine     Supine to sit: Mod assist (assist for trunk) Sit to supine: Min assist (assist for LE's)   General bed mobility comments: vc's for technique    Transfers Overall transfer level: Needs assistance Equipment used: Rolling walker (2 wheels) Transfers: Sit to/from Stand Sit to Stand: Mod assist           General transfer comment: assist to stand from bed and control descent sitting; vc's for UE placement    Ambulation/Gait Ambulation/Gait assistance: Min assist, Mod assist Gait Distance (Feet):  (sidestep to R a couple feet along bed (towards [facility])) Assistive device: Rolling walker (2 wheels)   Gait velocity: decreased     General Gait Details: increased effort to take steps; unsteady; decreased B LE step length/foot clearance  Stairs            Wheelchair Mobility     Tilt Bed    Modified Rankin (Stroke Patients Only)       Balance Overall balance assessment: Needs assistance Sitting-balance support: No upper extremity supported, Feet supported Sitting balance-Leahy Scale: Good Sitting balance - Comments: steady reaching within BOS   Standing balance support: Bilateral upper extremity supported, Reliant on assistive device for balance Standing balance-Leahy Scale: Fair Standing balance comment: steady static standing with B UE support on RW                             Pertinent Vitals/Pain Pain Assessment Pain Assessment: No/denies pain Vitals (HR and SpO2 on room air) stable  and WFL throughout treatment session; HR 83 bpm and SpO2 sats 95% at rest end of session.    Home Living Family/patient expects to be discharged to:: Other (Comment) (Independent Living ([facility] Living Arrangements: Alone Available Help at Discharge: Personal care attendant Type of Home: Independent living facility                   Prior Function Prior Level of Function : Independent/Modified Independent             Mobility Comments: Modified independent ambulating with rollator. ADLs Comments: Has an aide that comes Monday and Thursday 11am-3pm (assists with meals, showers, etc).  Houskeeper comes every other Tuesday.     Extremity/Trunk Assessment   Upper Extremity Assessment Upper Extremity Assessment: Overall WFL for tasks assessed    Lower Extremity Assessment Lower Extremity Assessment: Generalized weakness    Cervical / Trunk Assessment Cervical / Trunk Assessment: Normal  Communication   Communication Communication: No apparent difficulties Cueing Techniques: Verbal cues  Cognition Arousal: Alert Behavior During Therapy: WFL for tasks assessed/performed Overall Cognitive Status: Within Functional Limits for tasks assessed                                          General Comments General comments (skin integrity, edema, etc.): B LE swelling noted.  Nursing cleared pt for participation in physical therapy.  Pt agreeable to PT session.    Exercises     Assessment/Plan    PT Assessment Patient needs continued PT services  PT Problem List Decreased strength;Decreased activity tolerance;Decreased balance;Decreased mobility;Decreased knowledge of use of DME       PT Treatment Interventions DME instruction;Gait training;Functional mobility training;Therapeutic activities;Therapeutic exercise;Balance training;Patient/family education    PT Goals (Current goals can be found in the Care Plan section)  Acute Rehab PT Goals Patient Stated Goal: to improve strength and mobility PT Goal Formulation: With patient Time For Goal Achievement: 09/21/23 Potential to Achieve Goals: Good    Frequency Min 1X/week     Co-evaluation               AM-PAC PT "6 Clicks" Mobility  Outcome Measure Help needed turning from your back to your side while in a flat bed without  using bedrails?: None Help needed moving from lying on your back to sitting on the side of a flat bed without using bedrails?: A Lot Help needed moving to and from a bed to a chair (including a wheelchair)?: A Lot Help needed standing up from a chair using your arms (e.g., wheelchair or bedside chair)?: A Lot Help needed to walk in hospital room?: Total Help needed climbing 3-5 steps with a railing? : Total 6 Click Score: 12    End of Session Equipment Utilized During Treatment: Gait belt Activity Tolerance: Patient limited by fatigue Patient left: in bed;with call bell/phone within reach;with bed alarm set;Other (comment) (B heels floating via pillow support) Nurse Communication: Mobility status;Precautions;Other (comment) (small spots of red/blood noted on pt's bed pad (old pad removed and new pad placed)) PT Visit Diagnosis: Unsteadiness on feet (R26.81);Other abnormalities of gait and mobility (R26.89);Muscle weakness (generalized) (M62.81)    Time: 7253-6644 PT Time Calculation (min) (ACUTE ONLY): 22 min   Charges:   PT Evaluation $PT Eval Low Complexity: 1 Low PT Treatments $Therapeutic Activity: 8-22 mins PT General Charges $$ ACUTE PT VISIT: 1 Visit  Hendricks Limes, PT 09/07/23, 2:38 PM

## 2023-09-07 NOTE — TOC CM/SW Note (Signed)
Transition of Care [facility]) - Inpatient Brief Assessment   Patient Details  Name: Kelli Williams MRN: [id number] Date of Birth: [birth year]  Transition of [facility]) CM/SW Contact:    Bing Quarry, RN Phone Number: 09/07/2023, 5:18 PM   Clinical Narrative: 09/07/23: Patient presenting from Ind. Living at [facility] to [facility] ED on 09/06/23 with c/o bright red blood per rectum. Noted: Recent burn--using heating pad for LLQ pain. Pt admitted with GI bleed. PMH significant for metastatic breast CA, HLD, aortic atherosclerosis, anxiety, neuropathic pain B LE's, R partial knee arthroplasty.  Pt recommendations for STR. Patient in a progressive care facility. CSW referral with FL2 submitted via electronic HUB.   Gabriel Cirri MSN RN CM  Transitions of Care Department [facility] [phone] Weekends Only    Transition of Care Asessment: Insurance and Status: Insurance coverage has been reviewed Patient has primary care physician: Yes Home environment has been reviewed: From Ind. Living at [facility] Prior level of function:: Ind/Mod I with rollator at [place] setting. Prior/Current Home Services: No current home services Social Determinants of Health Reivew: SDOH reviewed no interventions necessary Readmission risk has been reviewed: No (Score is 14 % currently.) Transition of care needs: transition of care needs identified, TOC will continue to follow

## 2023-09-07 NOTE — Consult Note (Signed)
Wyline Mood , MD [address], [address], [place], [place], [address] 3940 [address], [address], [place], [place], [address] Phone: [phone]  Fax: [phone]  Consultation  Referring Provider:    ER Primary Care Physician:  Larae Grooms, NP Primary Gastroenterologist:  Dr. Servando Snare         Reason for Consultation:     GI bleed  Date of Admission:  09/06/2023 Date of Consultation:  09/07/2023         HPI:   Kelli Williams is a 89 y.o. female please present to the hospital with bright red blood per rectum she said it was a large quantity.  She subsequently had further bowel movements with dark and bright blood in it but smaller in quantity.  Associated with cramping but no abdominal pain.  Denies any NSAID use.  No hematemesis.  No similar episodes in the past..  She has a history of metastatic breast cancer hyperlipidemia.  She had a preceding history of left lower quadrant pain for a week she is on tamoxifen.  2 weeks back hemoglobin was 12.4 g and today the hemoglobin is 8.8 g with an MCV of 89.8.  Creatinine 0.79.  09/06/2023 CT angiogram showed no evidence of active GI bleeding.    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    BREAST BIOPSY Bilateral 1960   benign   BREAST LUMPECTOMY Left 2000   breast ca with rad tx   BREAST LUMPECTOMY Left 08/20/2017   BREAST LUMPECTOMY Left 08/20/2017   Procedure: left breast wide excision;  Surgeon: Earline Mayotte, MD;  Location: [facility] ORS;  Service: General;  Laterality: Left;   CATARACT EXTRACTION     left eye   CHOLECYSTECTOMY     JOINT REPLACEMENT     NASAL SEPTUM SURGERY     PARTIAL KNEE ARTHROPLASTY     right   TOTAL VAGINAL HYSTERECTOMY      Prior to Admission medications   Medication Sig Start Date End Date Taking? Authorizing Provider  doxazosin (CARDURA) 1 MG tablet TAKE 1 TABLET BY MOUTH TWICE  DAILY 08/25/23  Yes Gollan, Tollie Pizza, MD  ezetimibe (ZETIA) 10 MG tablet TAKE 1 TABLET BY MOUTH DAILY 07/04/23  Yes Gollan, Tollie Pizza, MD  gabapentin (NEURONTIN) 300 MG capsule Take 3 capsules (900 mg total) by mouth 2 (two) times daily. 05/07/23  Yes Larae Grooms, NP  lisinopril (ZESTRIL) 40  MG tablet Take 40 mg by mouth daily.   Yes [provider]  omeprazole (PRILOSEC) 20 MG capsule Take 20 mg by mouth daily.   Yes [provider]  tamoxifen (NOLVADEX) 20 MG tablet TAKE 1 TABLET BY MOUTH DAILY 02/21/23  Yes Alinda Dooms, NP    Family History  Problem Relation Age of Onset   Heart disease Mother    Heart attack Paternal Uncle    Breast cancer Daughter 68       genetic negative   Cancer Paternal Grandmother      Social History   Tobacco Use   Smoking status: Never   Smokeless tobacco: Never   Tobacco comments:    smoking cessation materials not required  Vaping Use   Vaping status: Never Used  Substance Use Topics   Alcohol use: No    Alcohol/week: 0.0 standard drinks of alcohol   Drug use: No    Allergies as of 09/06/2023 - Review  Complete 09/06/2023  Allergen Reaction Noted   Amitriptyline Hives 06/19/2015   Amoxicillin  01/09/2017   Black pepper [piper]  05/24/2019   Ivp dye [iodinated contrast media]  09/10/2017   Tape Other (See Comments) 03/09/2018   Silicone Rash 03/09/2018   Sulfa antibiotics Rash 10/03/2014    Review of Systems:    All systems reviewed and negative except where noted in HPI.   Physical Exam:  Vital signs in last 24 hours: Temp:  [98 F (36.7 C)-99 F (37.2 C)] 98.4 F (36.9 C) (09/29 0817) Pulse Rate:  [62-98] 71 (09/29 0817) Resp:  [13-26] 16 (09/29 0817) BP: (98-211)/(61-97) 168/61 (09/29 0817) SpO2:  [91 %-100 %] 94 % (09/29 0817) Weight:  [85 kg] 85 kg (09/28 1645) Last BM Date : 09/06/23 General:   Pleasant, cooperative in NAD Head:  Normocephalic and atraumatic. Eyes:   No icterus.   Conjunctiva pink. PERRLA. Ears:  Normal auditory acuity. Neck:  Supple; no masses or thyroidomegaly Lungs: Respirations even and unlabored. Lungs clear to auscultation bilaterally.   No wheezes, crackles, or rhonchi.  Heart:  Regular rate and rhythm;  Without murmur, clicks, rubs or gallops Abdomen:  Soft, nondistended, nontender. Normal bowel sounds. No appreciable masses or hepatomegaly.  No rebound or guarding.  Neurologic:  Alert and oriented x3;  grossly normal neurologically.  Explained to her that the options would be to do no procedures versus perform a colonoscopy.  Discussed with the patient as well as daughter the practical issues in terms of Psych:  Alert and cooperative. Normal affect.  LAB RESULTS: Recent Labs    09/06/23 0703 09/06/23 1309 09/06/23 1704 09/06/23 1957 09/07/23 0431  WBC 6.6 9.7  --   --  6.5  HGB 11.0* 10.0* 10.1* 9.6* 8.8*  HCT 34.2* 31.5* 30.9* 28.5* 26.4*  PLT 155 149*  --   --  144*   BMET Recent Labs    09/06/23 0703 09/07/23 0431  NA 140 139  K 3.9 3.5  CL 108 108  CO2 24 25  GLUCOSE 115* 97  BUN 30* 25*  CREATININE 0.79 0.79  CALCIUM  8.5* 8.2*   LFT Recent Labs    09/07/23 0431  PROT 5.2*  ALBUMIN 3.0*  AST 16  ALT 11  ALKPHOS 37*  BILITOT 0.7   PT/INR Recent Labs    09/06/23 0703  LABPROT 14.4  INR 1.1    STUDIES: CT ANGIO GI BLEED  Result Date: 09/06/2023 CLINICAL DATA:  GI bleeding. History of breast  carcinoma with osseous metastatic disease. EXAM: CTA ABDOMEN AND PELVIS WITHOUT AND WITH CONTRAST TECHNIQUE: Multidetector CT imaging of the abdomen and pelvis was performed using the standard protocol during bolus administration of intravenous contrast. Multiplanar reconstructed images and MIPs were obtained and reviewed to evaluate the vascular anatomy. RADIATION DOSE REDUCTION: This exam was performed according to the departmental dose-optimization program which includes automated exposure control, adjustment of the mA and/or kV according to patient size and/or use of iterative reconstruction technique. CONTRAST:  OMNIPAQUE IOHEXOL 350 MG/ML SOLN COMPARISON:  PET-CT 07/24/2022 and previous FINDINGS: VASCULAR Aorta: Moderate calcified atheromatous plaque. No aneurysm, dissection, or stenosis. Celiac: Calcified ostial plaque resulting in mild ostial short-segment stenosis, patent distally. SMA: Calcified ostial and proximal plaque resulting in tandem high-grade stenoses, patent distally with classic distal branch anatomy. Renals: Single bilaterally, both with calcified ostial plaque without high-grade stenosis. IMA: Patent without evidence of aneurysm, dissection, vasculitis or significant stenosis. Inflow: Mild scattered calcified plaque without aneurysm or stenosis. Proximal Outflow: Minimally atheromatous, patent. Veins: Patent hepatic veins, portal vein, SMV, splenic vein, bilateral renal veins, iliac venous system and IVC. Review of the MIP images confirms the above findings. NON-VASCULAR Lower chest: Small hiatal hernia. No pleural or pericardial effusion. Visualized lung bases clear. Hepatobiliary: No focal  liver abnormality is seen. Status post cholecystectomy. No biliary dilatation. Pancreas: Unremarkable. No pancreatic ductal dilatation or surrounding inflammatory changes. Spleen: Normal in size without focal abnormality. Adrenals/Urinary Tract: No adrenal mass. No focal renal lesion, urolithiasis, or hydronephrosis. Urinary bladder physiologically distended. Stomach/Bowel: Hiatal hernia involving the fundus. The stomach is nondistended. Small bowel decompressed. Appendix not identified. The colon is partially distended with multiple diverticula throughout, without focal inflammatory process. Lymphatic: No abdominal or pelvic adenopathy. Reproductive: Status post hysterectomy. No adnexal masses. Other: No ascites.  No free air. Musculoskeletal: Spondylitic changes throughout the visualized thoracolumbar spine. Lucent lesions with peripheral sclerosis in the thoracolumbar spine and left hip stable. No fracture or worrisome bone lesion. IMPRESSION: 1. No evidence of active GI bleeding, or focal lesion. 2. Proximal SMA occlusive disease, patent distally. Celiac axis and IMA remain patent. 3. Colonic diverticulosis. 4. Hiatal hernia. 5.  Aortic Atherosclerosis (ICD10-I70.0). Electronically Signed   By: Corlis Leak M.D.   On: 09/06/2023 12:56      Impression / Plan:   Kelli Williams is a 89 y.o. y/o female with a short history of rectal bleeding.  She has a history of metastatic breast cancer on tamoxifen.  I have been consulted to evaluate for the GI bleed.  She has had over 3 g drop in hemoglobin with no elevation in BUN/creatinine ratio.  Likely lower GI bleed.  I explained to the patient as well as her daughter the options including doing no further workup versus doing a colonoscopy.  Explained to her that at her age the risks would significantly be higher.  We also discussed the practical issues of going through a bowel prep with her mobility and her age.  Overall she wishes not to do any endoscopy procedures  at this point of time.  Obviously if something changes medical status then we may need to reevaluate.  In the interim continue to monitor CBC transfuse as needed  Thank you for involving me in the care of this patient.      LOS: 1 day   Wyline Mood, MD  09/07/2023, 9:48 AM

## 2023-09-07 NOTE — [place] FL2 (Signed)
[place] MEDICAID FL2 LEVEL OF CARE FORM     IDENTIFICATION  Patient Name: Kelli Williams Birthdate: [birth year] Sex: female Admission Date (Current Location): 09/06/2023  [place] and [place] Number:  Chiropodist and Address:  [facility], [address] [place], [place] [address]      Provider Number: [id number]  Attending Physician Name and Address:  Loyce Dys, MD  Relative Name and Phone Number:  Jentz-STEVENS,Maribeth (Daughter)  [phone] (Mobile)    Current Level of Care: Hospital Recommended Level of Care: Skilled Nursing Facility Prior Approval Number: [id number]  Date Approved/Denied: 11/17/08 PASRR Number: [id number]  Discharge Plan: SNF    Current Diagnoses: Patient Active Problem List   Diagnosis Date Noted   GI bleed 09/06/2023   Acute GI bleeding 09/06/2023   Acute blood loss anemia 09/06/2023   Hyperlipidemia 09/06/2023   Neuropathy 09/06/2023   Impaired mobility and ADLs 06/25/2022   Polypharmacy 06/25/2022   Chronic intractable headache 03/12/2022   Onychomycosis 03/12/2022   DNR (do not resuscitate) 02/26/2022   Acute midline back pain 03/12/2021   Lower extremity neuropathy 04/25/2020   Lumbar pain 04/26/2019   PAD (peripheral artery disease) (HCC) 07/01/2018   Aortic atherosclerosis (HCC) 07/01/2018   Coronary artery calcification seen on CT scan 07/01/2018   Cancer associated pain 02/05/2018   Goals of care, counseling/discussion 11/24/2017   Candida infection 11/24/2017   Esophageal reflux 10/28/2017   Postoperative seroma of subcutaneous tissue after non-dermatologic procedure 10/22/2017   Osteopenia 09/08/2017   Irritable bowel syndrome without diarrhea 06/27/2017   BMI 33.0-33.9,adult 06/27/2017   OA (osteoarthritis) of knee 10/16/2016   Breast cancer (HCC) 06/18/2016   Leg swelling 10/11/2015   Neuropathy involving both lower extremities 06/19/2015   Secondary localized  osteoarthrosis, ankle and foot 06/01/2015   Avitaminosis D 06/01/2015   Essential hypertension 08/17/2012   History of breast cancer 08/17/2012    Orientation RESPIRATION BLADDER Height & Weight     Self, Time, Situation, Place  Normal Incontinent Weight: 85 kg Height:  5\' 3"  (160 cm)  BEHAVIORAL SYMPTOMS/MOOD NEUROLOGICAL BOWEL NUTRITION STATUS      Continent Diet  AMBULATORY STATUS COMMUNICATION OF NEEDS Skin   Limited Assist Verbally Other (Comment) (Left, Lower, Medial scabbed over burn from heating pad.)                       Personal Care Assistance Level of Assistance  Bathing, Dressing Bathing Assistance: Maximum assistance   Dressing Assistance: Limited assistance     Functional Limitations Info  Sight Sight Info: Impaired        SPECIAL CARE FACTORS FREQUENCY  PT (By licensed PT), OT (By licensed OT)     PT Frequency: 5x/week OT Frequency: 5x/week            Contractures Contractures Info: Not present    Additional Factors Info  Code Status, Allergies Code Status Info: DNR-Limited, please refer to chart for details. Allergies Info: Amitriptyline, Amoxicillin, Black Pepper ([place]), Ivp Dye (Iodinated Contrast Media), Tape, Silicone, Sulfa Antibiotics           Current Medications (09/07/2023):  This is the current hospital active medication list Current Facility-Administered Medications  Medication Dose Route Frequency Provider Last Rate Last Admin   acetaminophen (TYLENOL) tablet 650 mg  650 mg Oral Q6H PRN Core, Doy Hutching, MD   650 mg at 09/07/23 1324   doxazosin (CARDURA) tablet 1 mg  1 mg Oral BID Core,  Doy Hutching, MD   1 mg at 09/07/23 1610   ezetimibe (ZETIA) tablet 10 mg  10 mg Oral Daily Core, Doy Hutching, MD   10 mg at 09/07/23 0916   gabapentin (NEURONTIN) capsule 200 mg  200 mg Oral BID Core, Doy Hutching, MD   200 mg at 09/07/23 0916   hydrALAZINE (APRESOLINE) injection 5 mg  5 mg Intravenous Q6H PRN Core, Doy Hutching, MD   5 mg at 09/07/23 0925    [START ON 09/08/2023] influenza vaccine adjuvanted (FLUAD) injection 0.5 mL  0.5 mL Intramuscular Once Core, Doy Hutching, MD       lidocaine (LIDODERM) 5 % 1 patch  1 patch Transdermal Q24H Core, Doy Hutching, MD   1 patch at 09/07/23 1433   [START ON 09/10/2023] pantoprazole (PROTONIX) injection 40 mg  40 mg Intravenous Q12H Core, Doy Hutching, MD       pantoprozole (PROTONIX) 80 mg /NS 100 mL infusion  8 mg/hr Intravenous Continuous Core, Doy Hutching, MD 10 mL/hr at 09/07/23 1355 8 mg/hr at 09/07/23 1355     Discharge Medications: Please see discharge summary for a list of discharge medications.  Relevant Imaging Results:  Relevant Lab Results:   Additional Information SS# [id number]  (Current resident of [place] at [facility])  Bing Quarry, RN

## 2023-09-07 NOTE — Progress Notes (Addendum)
Progress Note   Patient: Kelli Williams:[id number] DOB: 1926/09/23 DOA: 09/06/2023     1 DOS: the patient was seen and examined on 09/07/2023    Subjective:  Patient seen in his family at bedside this morning in the presence of the daughter Still had some bright red bleeding per rectum however bleeding scan did not show any acute bleeding Denies abdominal pain nausea vomiting or hematemesis   Brief hospital course: From HPI "89 year old female who lives in an independent living facility presents with a past medical history of metastatic breast cancer hyperlipidemia and aortic atherosclerosis who presents to the emergency department reporting bright red blood per rectum since late last night.  She denies any melena only hematochezia denies any nausea vomiting but does report intermittent abdominal pain over the last month or so.  Of note she had left lower quadrant pain for weeks resulting in her burning herself as she was trying to use a heating pad to help with the pain, this is now since healing stage without any signs of infection.  She reports today that she has right lower quadrant discomfort intermittently.  Patient denies any neurological symptoms headache or chest pain at this time.   Patient has 2 family members at the bedside including daughter Kelli Williams who is a Engineer, civil (consulting) and we had a prolonged goals of care discussion including full range of aggressive care versus comfort care patient is clear she is DNR. Patient and multiple family members clear at the bedside the patient is DO NOT RESUSCITATE however not comfort measures at this time they are okay with blood transfusion and regarding endoscopy would like to do shared decision making before making a decision regarding this if it becomes indicated.  Prolonged discussion also including the chaplain Melissa at the bedside "    Assessment and Plan:  * GI bleed Gastrointestinal bleed, suspect lower source Bleeding scan have been reviewed  that did not show any active bleeding Continue PPI therapy I have discussed the case with gastroenterologist Dr. Tobi Bastos Patient at this time is deferring scoping to outpatient Diet have been advanced by GI We will continue to monitor CBC closely  Neuropathy Neuropathy Holding home meloxicam and decreasing home Neurontin 900 mg twice daily to 300 mg twice daily given is on the beers list and 87 years   Hyperlipidemia Continue the patient's ezetimibe   Acute blood loss anemia Acute blood loss anemia, secondary to GI bleed Currently no evidence of endorgan ischemia Presenting hemoglobin was 11 down to 10 mg/dL In the setting of ongoing GI bleed Serial hemoglobin and transfuse as needed based off of symptoms and serious consideration of less than 7 mg/dL   Aortic atherosclerosis (HCC) Proximal SMA occlusive disease, patent distally. Celiac axis and IMA remain patent. Cannot tolerate anticoagulation at this time   Breast cancer [facility]) Metastatic breast cancer Continue the patient's home tamoxifen     Essential hypertension Will hold lisinopril and continue the patient's Cardura as long as she remains hemodynamically stable   DVT prophylaxis-SCD    Advance Care Planning:   Code Status: Limited: Do not attempt resuscitation (DNR) -DNR-LIMITED -Do Not Intubate/DNI       Consults: GI [Contacted by ED]   Family Communication: Daughter at bedside       Physical Exam: Neck: Normocephalic/atraumatic Respiratory:    Clear to auscultation bilaterally Cardiovascular: S1-S2 normal no murmur heard murmurs              Lower Extremity Edema Gastrointestinal: Nontender  nondistended or rebound  CNS: Alert and oriented x 3 nonfocal   Data Reviewed: I have reviewed patient's previous admission documentation, gastroenterology documentation, transition of care manager documentation, CBC as well as CMP results as shown below, vitals, I have also reviewed nursing documentation as  well as admitting provider documentation.  I have also reviewed patient bleeding scan that did not show any acute bleeding  Time spent: 58 minutes spent reviewing above data as well as discussing goals of care with patient as well as patient's daughter present at bedside, 50% of this time was spent at bedside discussing goals of care with patient.   Vitals:   09/07/23 0405 09/07/23 0415 09/07/23 0817 09/07/23 1038  BP: (!) 178/66 (!) 168/71 (!) 168/61 (!) 140/52  Pulse: 79 80 71 64  Resp: 16 18 16    Temp: 98.1 F (36.7 C)  98.4 F (36.9 C)   TempSrc:   Oral   SpO2: 94%  94%   Weight:      Height:         Author: Loyce Dys, MD 09/07/2023 5:20 PM  For on call review [email or web address].

## 2023-09-07 NOTE — Plan of Care (Signed)
  Problem: Education: Goal: Knowledge of General Education information will improve Description: Including pain rating scale, medication(s)/side effects and non-pharmacologic comfort measures Outcome: Progressing   Problem: Nutrition: Goal: Adequate nutrition will be maintained Outcome: Progressing   Problem: Safety: Goal: Ability to remain free from injury will improve Outcome: Progressing   

## 2023-09-08 DIAGNOSIS — K922 Gastrointestinal hemorrhage, unspecified: Secondary | ICD-10-CM | POA: Diagnosis not present

## 2023-09-08 LAB — BASIC METABOLIC PANEL
Anion gap: 5 (ref 5–15)
BUN: 20 mg/dL (ref 8–23)
CO2: 26 mmol/L (ref 22–32)
Calcium: 8.1 mg/dL — ABNORMAL LOW (ref 8.9–10.3)
Chloride: 110 mmol/L (ref 98–111)
Creatinine, Ser: 0.69 mg/dL (ref 0.44–1.00)
GFR, Estimated: >60 mL/min (ref >60)
Glucose, Bld: 94 mg/dL (ref 70–99)
Potassium: 3.4 mmol/L — ABNORMAL LOW (ref 3.5–5.1)
Sodium: 141 mmol/L (ref 135–145)

## 2023-09-08 LAB — CBC WITH DIFFERENTIAL/PLATELET
Abs Immature Granulocytes: 0.03 10*3/uL (ref 0.00–0.07)
Basophils Absolute: 0.1 10*3/uL (ref 0.0–0.1)
Basophils Relative: 1 %
Eosinophils Absolute: 0.4 10*3/uL (ref 0.0–0.5)
Eosinophils Relative: 6 %
HCT: 25.4 % — ABNORMAL LOW (ref 36.0–46.0)
Hemoglobin: 8.7 g/dL — ABNORMAL LOW (ref 12.0–15.0)
Immature Granulocytes: 1 %
Lymphocytes Relative: 27 %
Lymphs Abs: 1.6 10*3/uL (ref 0.7–4.0)
MCH: 30.1 pg (ref 26.0–34.0)
MCHC: 34.3 g/dL (ref 30.0–36.0)
MCV: 87.9 fL (ref 80.0–100.0)
Monocytes Absolute: 0.4 10*3/uL (ref 0.1–1.0)
Monocytes Relative: 7 %
Neutro Abs: 3.5 10*3/uL (ref 1.7–7.7)
Neutrophils Relative %: 58 %
Platelets: 140 10*3/uL — ABNORMAL LOW (ref 150–400)
RBC: 2.89 MIL/uL — ABNORMAL LOW (ref 3.87–5.11)
RDW: 15.5 % (ref 11.5–15.5)
WBC: 6 10*3/uL (ref 4.0–10.5)
nRBC: 0 % (ref 0.0–0.2)

## 2023-09-08 MED ORDER — LISINOPRIL 20 MG PO TABS
40.0000 mg | ORAL_TABLET | Freq: Every day | ORAL | Status: DC
  Administered 2023-09-08: 40 mg via ORAL
  Filled 2023-09-08 (×2): qty 2

## 2023-09-08 MED ORDER — POTASSIUM CHLORIDE 20 MEQ PO PACK
40.0000 meq | PACK | ORAL | Status: AC
  Administered 2023-09-08 (×2): 40 meq via ORAL
  Filled 2023-09-08 (×2): qty 2

## 2023-09-08 MED ORDER — PANTOPRAZOLE SODIUM 40 MG PO TBEC
40.0000 mg | DELAYED_RELEASE_TABLET | Freq: Every day | ORAL | Status: DC
  Administered 2023-09-08 – 2023-09-09 (×2): 40 mg via ORAL
  Filled 2023-09-08 (×2): qty 1

## 2023-09-08 NOTE — TOC Progression Note (Signed)
Transition of Care [facility]) - Progression Note    Patient Details  Name: JAMIESHA VICTORIA MRN: [id number] Date of Birth: 1926-01-06  Transition of Care [facility]) CM/SW Contact  Margarito Liner, LCSW Phone Number: 09/08/2023, 1:26 PM  Clinical Narrative:   CSW started SNF insurance authorization.  Expected Discharge Plan and Services                                               Social Determinants of Health (SDOH) Interventions SDOH Screenings   Food Insecurity: No Food Insecurity (09/06/2023)  Housing: Low Risk  (09/06/2023)  Transportation Needs: No Transportation Needs (09/06/2023)  Utilities: Not At Risk (09/06/2023)  Alcohol Screen: Low Risk  (10/22/2022)  Depression (PHQ2-9): Low Risk  (07/18/2023)  Financial Resource Strain: Low Risk  (10/22/2022)  Physical Activity: Inactive (10/22/2022)  Social Connections: Socially Isolated (10/22/2022)  Stress: No Stress Concern Present (10/22/2022)  Tobacco Use: Low Risk  (09/06/2023)    Readmission Risk Interventions     No data to display

## 2023-09-08 NOTE — Plan of Care (Signed)

## 2023-09-08 NOTE — Progress Notes (Signed)
Mobility Specialist - Progress Note   09/08/23 1430  Mobility  Activity Stood at bedside;Ambulated with assistance in room;Transferred from chair to bed  Level of Assistance Minimal assist, patient does 75% or more  Assistive Device Front wheel walker  Distance Ambulated (ft) 4 ft  Activity Response Tolerated well  $Mobility charge 1 Mobility  Mobility Specialist Start Time (ACUTE ONLY) 1408  Mobility Specialist Stop Time (ACUTE ONLY) 1422  Mobility Specialist Time Calculation (min) (ACUTE ONLY) 14 min   Pt sitting in the recliner upon entry, utilizing RA. Pt expressed lightheadedness and BLE soreness upon transfer to bed. Pt STS to RW, took forward and backwards steps toward the bed MinA, left supine with alarm set and needs within reach. RN notified.   Zetta Bills Mobility Specialist 09/08/23 2:35 PM

## 2023-09-08 NOTE — Progress Notes (Signed)
Progress Note   Patient: Kelli Williams:[id number] DOB: Apr 01, 1926 DOA: 09/06/2023     2 DOS: the patient was seen and examined on 09/08/2023      Subjective:  Patient seen and examined in the presence of the daughter Bleeding per rectum improved Hemoglobin has remained stable We will continue to advance diet as tolerated PT OT has seen patient and has recommended skilled nursing facility placement Patient admits to feeling weak however improving     Brief hospital course: From HPI "89 year old female who lives in an independent living facility presents with a past medical history of metastatic breast cancer hyperlipidemia and aortic atherosclerosis who presents to the emergency department reporting bright red blood per rectum since late last night.  She denies any melena only hematochezia denies any nausea vomiting but does report intermittent abdominal pain over the last month or so.  Of note she had left lower quadrant pain for weeks resulting in her burning herself as she was trying to use a heating pad to help with the pain, this is now since healing stage without any signs of infection.  She reports today that she has right lower quadrant discomfort intermittently.  Patient denies any neurological symptoms headache or chest pain at this time.   Patient has 2 family members at the bedside including daughter Kelli Williams who is a Engineer, civil (consulting) and we had a prolonged goals of care discussion including full range of aggressive care versus comfort care patient is clear she is DNR. Patient and multiple family members clear at the bedside the patient is DO NOT RESUSCITATE however not comfort measures at this time they are okay with blood transfusion and regarding endoscopy would like to do shared decision making before making a decision regarding this if it becomes indicated.  Prolonged discussion also including the chaplain Melissa at the bedside "     Assessment and Plan:   * GI bleed likely  secondary to diverticulosis Gastrointestinal bleed, suspect lower source Bleeding scan have been reviewed that did not show any active bleeding Continue PPI therapy I have discussed the case with gastroenterologist Dr. Tobi Bastos Patient at this time is deferring scoping to outpatient Advancement of diet Continue to monitor CBC closely   Neuropathy Neuropathy Holding home meloxicam and decreasing home Neurontin 900 mg twice daily to 300 mg twice daily given is on the beers list and the patient's age   Hyperlipidemia Continue the patient's ezetimibe   Acute blood loss anemia Acute blood loss anemia, secondary to GI bleed Currently no evidence of endorgan ischemia Presenting hemoglobin was 11 down to 10 mg/dL In the setting of ongoing GI bleed Serial hemoglobin and transfuse as needed based off of symptoms and serious consideration of less than 7 mg/dL Hemoglobin have remained stable around 8   Aortic atherosclerosis (HCC) Proximal SMA occlusive disease, patent distally. Celiac axis and IMA remain patent. Cannot tolerate anticoagulation at this time   Breast cancer [facility]) Metastatic breast cancer Continue the patient's home tamoxifen     Essential hypertension Continue current antihypertensives   DVT prophylaxis-SCD    Advance Care Planning:   Code Status: Limited: Do not attempt resuscitation (DNR) -DNR-LIMITED -Do Not Intubate/DNI       Consults: GI [Contacted by ED]   Family Communication: Daughter at bedside         Physical Exam: Neck: Normocephalic/atraumatic Respiratory:    Clear to auscultation bilaterally Cardiovascular: S1-S2 normal no murmur heard murmurs  Lower Extremity Edema Gastrointestinal: Nontender nondistended or rebound  CNS: Alert and oriented x 3 nonfocal     Data Reviewed: I have reviewed patient's lab results today, CBC and BMP as well as hemoglobin levels I have discussed with gastroenterologist as well as transition of care  manager, I have reviewed PT OT documentation with recommendation for patient to go to skilled nursing facility     Vitals:   09/08/23 0045 09/08/23 0450 09/08/23 0750 09/08/23 0800  BP: (!) 177/67 (!) 184/68  (!) 158/62  Pulse: 76 72 82   Resp: 16 16 17    Temp: 98.1 F (36.7 C) 98.1 F (36.7 C) 98.3 F (36.8 C)   TempSrc:  Oral Oral   SpO2: 94% 98% 96%   Weight:      Height:         Author: Loyce Dys, MD 09/08/2023 3:20 PM  For on call review [email or web address].

## 2023-09-08 NOTE — Progress Notes (Signed)
Physical Therapy Treatment Patient Details Name: Kelli Williams MRN: [id number] DOB: [birth year] Today's Date: 09/08/2023   History of Present Illness Pt is a 89 y.o. female presenting to hospital 09/06/23 with c/o bright red blood per rectum.  Recent burn--using heating pad for LLQ pain.  Pt admitted with GI bleed.  PMH includes metastatic breast CA, HLD, aortic atherosclerosis, anxiety, neuropathic pain B LE's, R partial knee arthroplasty.    PT Comments  Pt resting in bed upon PT arrival; agreeable to therapy; pt's daughter present.  No c/o pain during session.  Currently pt is mod assist semi-supine to sitting EOB; min assist with transfers using RW; and min assist to ambulate a few feet with RW bed to recliner.  Limited activity/ambulation d/t SOB with activity; pt also reporting dizziness in standing but resolved shortly after resting in recliner.  Pt's HR 82-92 bpm and SpO2 sats on room air 96% or greater during sessions activities.  Will continue to focus on strengthening, endurance, and progressive functional mobility per pt tolerance.   If plan is discharge home, recommend the following: Assistance with cooking/housework;Assist for transportation;Help with stairs or ramp for entrance;A little help with walking and/or transfers;A little help with bathing/dressing/bathroom   Can travel by private vehicle     No  Equipment Recommendations  Other (comment) (TBD at next facility)    Recommendations for Other Services       Precautions / Restrictions Precautions Precautions: Fall Restrictions Weight Bearing Restrictions: No     Mobility  Bed Mobility Overal bed mobility: Needs Assistance Bed Mobility: Supine to Sit     Supine to sit: Mod assist, HOB elevated     General bed mobility comments: assist for trunk    Transfers Overall transfer level: Needs assistance Equipment used: Rolling walker (2 wheels) Transfers: Sit to/from Stand Sit to Stand: Min assist            General transfer comment: assist to stand from bed and control descent sitting in recliner; vc's for UE placement    Ambulation/Gait Ambulation/Gait assistance: Min assist Gait Distance (Feet): 3 Feet (bed to recliner) Assistive device: Rolling walker (2 wheels) Gait Pattern/deviations: Decreased step length - right, Decreased step length - left Gait velocity: decreased     General Gait Details: increased effort to take steps; limited d/t SOB   Stairs             Wheelchair Mobility     Tilt Bed    Modified Rankin (Stroke Patients Only)       Balance Overall balance assessment: Needs assistance Sitting-balance support: No upper extremity supported, Feet supported Sitting balance-Leahy Scale: Good Sitting balance - Comments: steady reaching within BOS   Standing balance support: Bilateral upper extremity supported, Reliant on assistive device for balance Standing balance-Leahy Scale: Fair Standing balance comment: steady static standing with B UE support on RW                            Cognition Arousal: Alert Behavior During Therapy: WFL for tasks assessed/performed Overall Cognitive Status: Within Functional Limits for tasks assessed                                          Exercises      General Comments General comments (skin integrity, edema, etc.): B LE swelling noted.  Nursing  cleared pt for participation in physical therapy.  Pt agreeable to PT session.  NT present during session.      Pertinent Vitals/Pain Pain Assessment Pain Assessment: No/denies pain    Home Living                          Prior Function            PT Goals (current goals can now be found in the care plan section) Acute Rehab PT Goals Patient Stated Goal: to improve strength and mobility PT Goal Formulation: With patient Time For Goal Achievement: 09/21/23 Potential to Achieve Goals: Good Progress towards PT goals:  Progressing toward goals    Frequency    Min 1X/week      PT Plan      Co-evaluation              AM-PAC PT "6 Clicks" Mobility   Outcome Measure  Help needed turning from your back to your side while in a flat bed without using bedrails?: None Help needed moving from lying on your back to sitting on the side of a flat bed without using bedrails?: A Lot Help needed moving to and from a bed to a chair (including a wheelchair)?: A Little Help needed standing up from a chair using your arms (e.g., wheelchair or bedside chair)?: A Little Help needed to walk in hospital room?: A Lot Help needed climbing 3-5 steps with a railing? : Total 6 Click Score: 15    End of Session Equipment Utilized During Treatment: Gait belt Activity Tolerance: Patient limited by fatigue Patient left: in chair;with call bell/phone within reach;with chair alarm set;with family/visitor present (therapist brought pt a warm blanket per pt request) Nurse Communication: Mobility status;Precautions PT Visit Diagnosis: Unsteadiness on feet (R26.81);Other abnormalities of gait and mobility (R26.89);Muscle weakness (generalized) (M62.81)     Time: 1040-1108 PT Time Calculation (min) (ACUTE ONLY): 28 min  Charges:    $Therapeutic Activity: 23-37 mins PT General Charges $$ ACUTE PT VISIT: 1 Visit                     Hendricks Limes, PT 09/08/23, 12:23 PM

## 2023-09-09 DIAGNOSIS — G629 Polyneuropathy, unspecified: Secondary | ICD-10-CM | POA: Diagnosis not present

## 2023-09-09 DIAGNOSIS — I959 Hypotension, unspecified: Secondary | ICD-10-CM | POA: Diagnosis not present

## 2023-09-09 DIAGNOSIS — C7951 Secondary malignant neoplasm of bone: Secondary | ICD-10-CM | POA: Diagnosis not present

## 2023-09-09 DIAGNOSIS — I700 Atherosclerosis of aorta: Secondary | ICD-10-CM | POA: Diagnosis not present

## 2023-09-09 DIAGNOSIS — C50912 Malignant neoplasm of unspecified site of left female breast: Secondary | ICD-10-CM | POA: Diagnosis not present

## 2023-09-09 DIAGNOSIS — Z08 Encounter for follow-up examination after completed treatment for malignant neoplasm: Secondary | ICD-10-CM | POA: Diagnosis not present

## 2023-09-09 DIAGNOSIS — I739 Peripheral vascular disease, unspecified: Secondary | ICD-10-CM | POA: Diagnosis not present

## 2023-09-09 DIAGNOSIS — R8290 Unspecified abnormal findings in urine: Secondary | ICD-10-CM | POA: Diagnosis not present

## 2023-09-09 DIAGNOSIS — D649 Anemia, unspecified: Secondary | ICD-10-CM | POA: Diagnosis not present

## 2023-09-09 DIAGNOSIS — M6281 Muscle weakness (generalized): Secondary | ICD-10-CM | POA: Diagnosis not present

## 2023-09-09 DIAGNOSIS — G301 Alzheimer's disease with late onset: Secondary | ICD-10-CM | POA: Diagnosis not present

## 2023-09-09 DIAGNOSIS — R262 Difficulty in walking, not elsewhere classified: Secondary | ICD-10-CM | POA: Diagnosis not present

## 2023-09-09 DIAGNOSIS — R2689 Other abnormalities of gait and mobility: Secondary | ICD-10-CM | POA: Diagnosis not present

## 2023-09-09 DIAGNOSIS — K922 Gastrointestinal hemorrhage, unspecified: Secondary | ICD-10-CM | POA: Diagnosis not present

## 2023-09-09 DIAGNOSIS — Z9181 History of falling: Secondary | ICD-10-CM | POA: Diagnosis not present

## 2023-09-09 DIAGNOSIS — Z853 Personal history of malignant neoplasm of breast: Secondary | ICD-10-CM | POA: Diagnosis not present

## 2023-09-09 DIAGNOSIS — E785 Hyperlipidemia, unspecified: Secondary | ICD-10-CM | POA: Diagnosis not present

## 2023-09-09 DIAGNOSIS — I10 Essential (primary) hypertension: Secondary | ICD-10-CM | POA: Diagnosis not present

## 2023-09-09 DIAGNOSIS — Z85830 Personal history of malignant neoplasm of bone: Secondary | ICD-10-CM | POA: Diagnosis not present

## 2023-09-09 DIAGNOSIS — K219 Gastro-esophageal reflux disease without esophagitis: Secondary | ICD-10-CM | POA: Diagnosis not present

## 2023-09-09 LAB — CBC WITH DIFFERENTIAL/PLATELET
Abs Immature Granulocytes: 0.03 10*3/uL (ref 0.00–0.07)
Basophils Absolute: 0.1 10*3/uL (ref 0.0–0.1)
Basophils Relative: 1 %
Eosinophils Absolute: 0.4 10*3/uL (ref 0.0–0.5)
Eosinophils Relative: 6 %
HCT: 25.7 % — ABNORMAL LOW (ref 36.0–46.0)
Hemoglobin: 8.6 g/dL — ABNORMAL LOW (ref 12.0–15.0)
Immature Granulocytes: 0 %
Lymphocytes Relative: 24 %
Lymphs Abs: 1.7 10*3/uL (ref 0.7–4.0)
MCH: 30 pg (ref 26.0–34.0)
MCHC: 33.5 g/dL (ref 30.0–36.0)
MCV: 89.5 fL (ref 80.0–100.0)
Monocytes Absolute: 0.5 10*3/uL (ref 0.1–1.0)
Monocytes Relative: 7 %
Neutro Abs: 4.4 10*3/uL (ref 1.7–7.7)
Neutrophils Relative %: 62 %
Platelets: 149 10*3/uL — ABNORMAL LOW (ref 150–400)
RBC: 2.87 MIL/uL — ABNORMAL LOW (ref 3.87–5.11)
RDW: 15.6 % — ABNORMAL HIGH (ref 11.5–15.5)
WBC: 7 10*3/uL (ref 4.0–10.5)
nRBC: 0 % (ref 0.0–0.2)

## 2023-09-09 LAB — BASIC METABOLIC PANEL
Anion gap: 4 — ABNORMAL LOW (ref 5–15)
BUN: 21 mg/dL (ref 8–23)
CO2: 25 mmol/L (ref 22–32)
Calcium: 8.1 mg/dL — ABNORMAL LOW (ref 8.9–10.3)
Chloride: 109 mmol/L (ref 98–111)
Creatinine, Ser: 0.78 mg/dL (ref 0.44–1.00)
GFR, Estimated: >60 mL/min (ref >60)
Glucose, Bld: 105 mg/dL — ABNORMAL HIGH (ref 70–99)
Potassium: 3.8 mmol/L (ref 3.5–5.1)
Sodium: 138 mmol/L (ref 135–145)

## 2023-09-09 NOTE — TOC Transition Note (Addendum)
Transition of Care [facility]) - CM/SW Discharge Note   Patient Details  Name: Kelli Williams MRN: [id number] Date of Birth: 1926-02-17  Transition of Care [facility]) CM/SW Contact:  Allena Katz, LCSW Phone Number: 09/09/2023, 11:02 AM   Clinical Narrative:   Pt has orders to discharge to [facility] [place]. RN given number for report. DNR signed and on chart. DC summary to be sent once in. Medical necessity placed on chart. Danielle with [employer] notified. CSW lvm with daughter.    Final next level of care: Skilled Nursing Facility Barriers to Discharge: Barriers Resolved   Patient Goals and CMS Choice CMS Medicare.gov Compare Post Acute Care list provided to:: Patient Choice offered to / list presented to : Patient  Discharge Placement PASRR number recieved: 09/09/23 PASRR number recieved: 09/09/23              Patient to be transferred to facility by: [facility]   Patient and family notified of of transfer: 09/09/23  Discharge Plan and Services Additional resources added to the After Visit Summary for                                       Social Determinants of Health (SDOH) Interventions SDOH Screenings   Food Insecurity: No Food Insecurity (09/06/2023)  Housing: Low Risk  (09/06/2023)  Transportation Needs: No Transportation Needs (09/06/2023)  Utilities: Not At Risk (09/06/2023)  Alcohol Screen: Low Risk  (10/22/2022)  Depression (PHQ2-9): Low Risk  (07/18/2023)  Financial Resource Strain: Low Risk  (10/22/2022)  Physical Activity: Inactive (10/22/2022)  Social Connections: Socially Isolated (10/22/2022)  Stress: No Stress Concern Present (10/22/2022)  Tobacco Use: Low Risk  (09/06/2023)     Readmission Risk Interventions     No data to display

## 2023-09-09 NOTE — TOC Progression Note (Signed)
Transition of Care [facility]) - Progression Note    Patient Details  Name: Kelli Williams MRN: [id number] Date of Birth: [birth year]  Transition of Care [facility]) CM/SW Contact  Allena Katz, LCSW Phone Number: 09/09/2023, 9:43 AM  Clinical Narrative:   CSW spoke with patient who reports she would like to go to [facility] at discharge for SNF but would like to go to an ALF in [place]. CSW offered choice on placement agency. CSW made referral to [employer] per family request.          Expected Discharge Plan and Services                                               Social Determinants of Health (SDOH) Interventions SDOH Screenings   Food Insecurity: No Food Insecurity (09/06/2023)  Housing: Low Risk  (09/06/2023)  Transportation Needs: No Transportation Needs (09/06/2023)  Utilities: Not At Risk (09/06/2023)  Alcohol Screen: Low Risk  (10/22/2022)  Depression (PHQ2-9): Low Risk  (07/18/2023)  Financial Resource Strain: Low Risk  (10/22/2022)  Physical Activity: Inactive (10/22/2022)  Social Connections: Socially Isolated (10/22/2022)  Stress: No Stress Concern Present (10/22/2022)  Tobacco Use: Low Risk  (09/06/2023)    Readmission Risk Interventions     No data to display

## 2023-09-09 NOTE — Progress Notes (Signed)
Mobility Specialist - Progress Note   09/09/23 1031  Mobility  Activity Transferred to/from [facility];Transferred from bed to chair;Ambulated with assistance in room  Level of Assistance Contact guard assist, steadying assist  Assistive Device Front wheel walker  Distance Ambulated (ft) 8 ft  Activity Response Tolerated well  $Mobility charge 1 Mobility  Mobility Specialist Start Time (ACUTE ONLY) 1007  Mobility Specialist Stop Time (ACUTE ONLY) 1028  Mobility Specialist Time Calculation (min) (ACUTE ONLY) 21 min   Pt supine upon entry, utilizing RA. Pt agreeable to transfer to the recliner this date. Pt completed bed mob MinA to bring BLE EOB, STS to RW MinA-- flexed trunk upon standing. Pt transferred to/from the [facility], and amb to the recliner CGA-- shuffle steps to the recliner. Pt left seated in the recliner with alarm set and needs within reach.  Zetta Bills Mobility Specialist 09/09/23 10:36 AM

## 2023-09-09 NOTE — Consult Note (Signed)
[facility] Customer service manager [facility]) Accountable Care Organization (ACO) [facility] Liaison Note  09/09/2023  Kelli Williams 05-27-1926 [id number]  Location: [facility] Kelli [facility] Hospital Liaison screened the patient remotely at [facility].  Insurance: [employer]   Kelli Williams is a 89 y.o. 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 The patient was screened for  readmission hospitalization with notedlow risk score for unplanned readmission risk with 1 IP in 6 months.  The patient was assessed for potential [facility]) Care Management service needs for post hospital transition for care coordination. Review of patient's electronic medical record reveals patient was admitted with CI leed. Pt will be discharged to the [facility] (retirement community). TOC documentation indicates pt working with [employer] for placement. No community care management needs at this time.    [facility] Care Management/Population Health does not replace or interfere with any arrangements made by the Inpatient Transition of Care team.   For questions contact:   Kelli Cousin, Kelli Williams, Kelli Williams   Population Health Office Hours MTWF  8:00 am-6:00 pm [phone] mobile [phone] [Office toll free line] Office Hours are M-F 8:30 - 5 pm [email or web address]

## 2023-09-09 NOTE — Discharge Summary (Signed)
Physician Discharge Summary   Patient: Kelli Williams MRN: [id number] DOB: 10-14-1926  Admit date:     09/06/2023  Discharge date: 09/09/23  Discharge Physician: Loyce Dys   PCP: Larae Grooms, NP   Recommendations at discharge:  Follow-up with primary care physician as well as gastroenterologist  Discharge Diagnoses: GI bleed likely secondary to diverticulosis Neuropathy Hyperlipidemia Acute blood loss anemia Aortic atherosclerosis (HCC) Breast cancer [facility]) Essential hypertension    Hospital Course:  89 year old female who lives in an independent living facility presents with a past medical history of metastatic breast cancer hyperlipidemia and aortic atherosclerosis who presents to the emergency department reporting bright red blood per rectum.  Of note she had left lower quadrant pain for weeks resulting in her burning herself as she was trying to use a heating pad to help with the pain.  Upon arrival to the emergency room patient had a drop in hemoglobin, CT angio GI bleed was obtained which showed no evidence of active GI bleeding or focal lesion however findings of colonic diverticulosis was seen.  Patient was seen by gastroenterologist who recommended consideration for scoping however patient chose the option of conservative management and will follow-up as an outpatient.  Patient hemoglobin remained stable and given no more active bleeding patient was seen by PT OT who recommended discharge to skilled nursing facility.  Patient is therefore being discharged to rehab and to follow-up with outpatient GI as well as primary care physician.   Consultants: Gastroenterologist Procedures performed: None Disposition: Skilled nursing facility Diet recommendation:  Cardiac diet DISCHARGE MEDICATION: Allergies as of 09/09/2023       Reactions   Amitriptyline Hives   Amoxicillin    Diarrhea    Black Pepper [piper]    Ivp Dye [iodinated Contrast Media]    Tape Other (See  Comments)   Whelps - please use paper tape.   Silicone Rash   Whelps - please use paper tape.   Sulfa Antibiotics Rash        Medication List     TAKE these medications    doxazosin 1 MG tablet Commonly known as: CARDURA TAKE 1 TABLET BY MOUTH TWICE  DAILY   ezetimibe 10 MG tablet Commonly known as: ZETIA TAKE 1 TABLET BY MOUTH DAILY   gabapentin 300 MG capsule Commonly known as: NEURONTIN Take 3 capsules (900 mg total) by mouth 2 (two) times daily.   lisinopril 40 MG tablet Commonly known as: ZESTRIL Take 40 mg by mouth daily.   omeprazole 20 MG capsule Commonly known as: PRILOSEC Take 20 mg by mouth daily.   tamoxifen 20 MG tablet Commonly known as: NOLVADEX TAKE 1 TABLET BY MOUTH DAILY        Discharge Exam: Filed Weights   09/06/23 0659 09/06/23 1645  Weight: 85.1 kg 85 kg   Neck: Normocephalic/atraumatic Respiratory:    Clear to auscultation bilaterally Cardiovascular: S1-S2 normal no murmur heard murmurs              Lower Extremity Edema Gastrointestinal: Nontender nondistended or rebound  CNS: Alert and oriented x 3 nonfocal    Condition at discharge: good  The results of significant diagnostics from this hospitalization (including imaging, microbiology, ancillary and laboratory) are listed below for reference.   Imaging Studies: CT ANGIO GI BLEED  Result Date: 09/06/2023 CLINICAL DATA:  GI bleeding. History of breast carcinoma with osseous metastatic disease. EXAM: CTA ABDOMEN AND PELVIS WITHOUT AND WITH CONTRAST TECHNIQUE: Multidetector CT imaging of the abdomen and  pelvis was performed using the standard protocol during bolus administration of intravenous contrast. Multiplanar reconstructed images and MIPs were obtained and reviewed to evaluate the vascular anatomy. RADIATION DOSE REDUCTION: This exam was performed according to the departmental dose-optimization program which includes automated exposure control, adjustment of the mA and/or kV  according to patient size and/or use of iterative reconstruction technique. CONTRAST:  OMNIPAQUE IOHEXOL 350 MG/ML SOLN COMPARISON:  PET-CT 07/24/2022 and previous FINDINGS: VASCULAR Aorta: Moderate calcified atheromatous plaque. No aneurysm, dissection, or stenosis. Celiac: Calcified ostial plaque resulting in mild ostial short-segment stenosis, patent distally. SMA: Calcified ostial and proximal plaque resulting in tandem high-grade stenoses, patent distally with classic distal branch anatomy. Renals: Single bilaterally, both with calcified ostial plaque without high-grade stenosis. IMA: Patent without evidence of aneurysm, dissection, vasculitis or significant stenosis. Inflow: Mild scattered calcified plaque without aneurysm or stenosis. Proximal Outflow: Minimally atheromatous, patent. Veins: Patent hepatic veins, portal vein, SMV, splenic vein, bilateral renal veins, iliac venous system and IVC. Review of the MIP images confirms the above findings. NON-VASCULAR Lower chest: Small hiatal hernia. No pleural or pericardial effusion. Visualized lung bases clear. Hepatobiliary: No focal liver abnormality is seen. Status post cholecystectomy. No biliary dilatation. Pancreas: Unremarkable. No pancreatic ductal dilatation or surrounding inflammatory changes. Spleen: Normal in size without focal abnormality. Adrenals/Urinary Tract: No adrenal mass. No focal renal lesion, urolithiasis, or hydronephrosis. Urinary bladder physiologically distended. Stomach/Bowel: Hiatal hernia involving the fundus. The stomach is nondistended. Small bowel decompressed. Appendix not identified. The colon is partially distended with multiple diverticula throughout, without focal inflammatory process. Lymphatic: No abdominal or pelvic adenopathy. Reproductive: Status post hysterectomy. No adnexal masses. Other: No ascites.  No free air. Musculoskeletal: Spondylitic changes throughout the visualized thoracolumbar spine. Lucent lesions  with peripheral sclerosis in the thoracolumbar spine and left hip stable. No fracture or worrisome bone lesion. IMPRESSION: 1. No evidence of active GI bleeding, or focal lesion. 2. Proximal SMA occlusive disease, patent distally. Celiac axis and IMA remain patent. 3. Colonic diverticulosis. 4. Hiatal hernia. 5.  Aortic Atherosclerosis (ICD10-I70.0). Electronically Signed   By: Corlis Leak M.D.   On: 09/06/2023 12:56    Microbiology: Results for orders placed or performed during the hospital encounter of 09/06/23  C Difficile Quick Screen w PCR reflex     Status: None   Collection Time: 09/06/23  1:09 PM   Specimen: STOOL  Result Value Ref Range Status   C Diff antigen NEGATIVE NEGATIVE Final   C Diff toxin NEGATIVE NEGATIVE Final   C Diff interpretation No C. difficile detected.  Final    Comment: Performed at [facility], [address] [place], [place] [address]  Gastrointestinal Panel by PCR , Stool     Status: None   Collection Time: 09/06/23  1:09 PM   Specimen: Stool  Result Value Ref Range Status   Campylobacter species NOT DETECTED NOT DETECTED Final   Plesimonas shigelloides NOT DETECTED NOT DETECTED Final   Salmonella species NOT DETECTED NOT DETECTED Final   Yersinia enterocolitica NOT DETECTED NOT DETECTED Final   Vibrio species NOT DETECTED NOT DETECTED Final   Vibrio cholerae NOT DETECTED NOT DETECTED Final   Enteroaggregative E coli (EAEC) NOT DETECTED NOT DETECTED Final   Enteropathogenic E coli (EPEC) NOT DETECTED NOT DETECTED Final   Enterotoxigenic E coli (ETEC) NOT DETECTED NOT DETECTED Final   Shiga like toxin producing E coli (STEC) NOT DETECTED NOT DETECTED Final   Shigella/Enteroinvasive E coli (EIEC) NOT DETECTED NOT DETECTED  Final   Cryptosporidium NOT DETECTED NOT DETECTED Final   Cyclospora cayetanensis NOT DETECTED NOT DETECTED Final   Entamoeba histolytica NOT DETECTED NOT DETECTED Final   Giardia lamblia NOT DETECTED NOT DETECTED Final    Adenovirus F40/41 NOT DETECTED NOT DETECTED Final   Astrovirus NOT DETECTED NOT DETECTED Final   Norovirus GI/GII NOT DETECTED NOT DETECTED Final   Rotavirus A NOT DETECTED NOT DETECTED Final   Sapovirus (I, II, IV, and V) NOT DETECTED NOT DETECTED Final    Comment: Performed at [facility], [address] [place], [place] [address]    Labs: CBC: Recent Labs  Lab 09/06/23 0703 09/06/23 1309 09/06/23 1704 09/06/23 1957 09/07/23 0431 09/07/23 1424 09/08/23 0439 09/09/23 0646  WBC 6.6 9.7  --   --  6.5 7.4 6.0 7.0  NEUTROABS 3.7  --   --   --   --  5.3 3.5 4.4  HGB 11.0* 10.0*   < > 9.6* 8.8* 8.7* 8.7* 8.6*  HCT 34.2* 31.5*   < > 28.5* 26.4* 25.8* 25.4* 25.7*  MCV 91.9 92.6  --   --  89.8 89.3 87.9 89.5  PLT 155 149*  --   --  144* 133* 140* 149*   < > = values in this interval not displayed.   Basic Metabolic Panel: Recent Labs  Lab 09/06/23 0703 09/07/23 0431 09/08/23 0439 09/09/23 0646  NA 140 139 141 138  K 3.9 3.5 3.4* 3.8  CL 108 108 110 109  CO2 24 25 26 25   GLUCOSE 115* 97 94 105*  BUN 30* 25* 20 21  CREATININE 0.79 0.79 0.69 0.78  CALCIUM 8.5* 8.2* 8.1* 8.1*   Liver Function Tests: Recent Labs  Lab 09/06/23 0703 09/07/23 0431  AST 17 16  ALT 11 11  ALKPHOS 58 37*  BILITOT 0.6 0.7  PROT 6.3* 5.2*  ALBUMIN 3.4* 3.0*   CBG: No results for input(s): "GLUCAP" in the last 168 hours.  Discharge time spent:  37 minutes.  Signed: Loyce Dys, MD [facility] 09/09/2023

## 2023-09-09 NOTE — Care Management Important Message (Signed)
Important Message  Patient Details  Name: Kelli Williams MRN: [id number] Date of Birth: 05-Dec-1926   Important Message Given:  N/A - LOS <3 / Initial given by admissions     Olegario Messier A Tonya Carlile 09/09/2023, 8:44 AM

## 2023-09-10 DIAGNOSIS — K922 Gastrointestinal hemorrhage, unspecified: Secondary | ICD-10-CM | POA: Diagnosis not present

## 2023-09-25 ENCOUNTER — Other Ambulatory Visit: Payer: Self-pay | Admitting: Nurse Practitioner

## 2023-09-25 DIAGNOSIS — G5793 Unspecified mononeuropathy of bilateral lower limbs: Secondary | ICD-10-CM

## 2023-09-26 NOTE — Telephone Encounter (Signed)
Rx 05/04/23 #540 1RF- too soon Requested Prescriptions  Pending Prescriptions Disp Refills   omeprazole (PRILOSEC) 20 MG capsule [Pharmacy Med Name: Omeprazole 20 MG Oral Capsule Delayed Release] 80 capsule 3    Sig: TAKE 1 CAPSULE BY MOUTH DAILY     Gastroenterology: Proton Pump Inhibitors Passed - 09/25/2023 10:25 PM      Passed - Valid encounter within last 12 months    Recent Outpatient Visits           2 months ago PAD (peripheral artery disease) (HCC)   [facility] Mecum, Erin E, PA-C   4 months ago Burn of abdomen wall, first degree, initial encounter   [place] [facility] Pearley, Sherran Needs, NP   5 months ago PAD (peripheral artery disease) (HCC)   [place] [facility] Larae Grooms, NP   8 months ago Aortic atherosclerosis [facility])   [place] [facility] Larae Grooms, NP   10 months ago Aortic atherosclerosis [facility])   [place] [facility] Larae Grooms, NP       Future Appointments             In 3 weeks Larae Grooms, NP [place] [facility], [facility]             gabapentin (NEURONTIN) 300 MG capsule [Pharmacy Med Name: GABAPENTIN CAP 300MG  (NEUR)] 480 capsule 3    Sig: TAKE 3 CAPSULES BY MOUTH TWICE  DAILY     Neurology: Anticonvulsants - gabapentin Passed - 09/25/2023 10:25 PM      Passed - Cr in normal range and within 360 days    Creatinine  Date Value Ref Range Status  02/06/2012 0.76 0.60 - 1.30 mg/dL Final   Creatinine, Ser  Date Value Ref Range Status  09/09/2023 0.78 0.44 - 1.00 mg/dL Final         Passed - Completed PHQ-2 or PHQ-9 in the last 360 days      Passed - Valid encounter within last 12 months    Recent Outpatient Visits           2 months ago PAD (peripheral artery disease) (HCC)   [place] [facility] Mecum, Erin E, PA-C   4 months ago Burn of abdomen wall, first degree, initial encounter   [facility] Pearley, Sherran Needs, NP   5 months ago PAD (peripheral artery disease) [facility])   [place] [facility] Larae Grooms, NP   8 months ago Aortic atherosclerosis [facility])   [place] [facility] Larae Grooms, NP   10 months ago Aortic atherosclerosis [facility])   [place] [facility] Larae Grooms, NP       Future Appointments             In 3 weeks Larae Grooms, NP  [facility], [facility]

## 2023-09-26 NOTE — Telephone Encounter (Signed)
Requested medication (s) are due for refill today - unsure  Requested medication (s) are on the active medication list -yes  Future visit scheduled -yes  Last refill: unsure  Notes to clinic: listed as historical medication   Requested Prescriptions  Pending Prescriptions Disp Refills   omeprazole (PRILOSEC) 20 MG capsule [Pharmacy Med Name: Omeprazole 20 MG Oral Capsule Delayed Release] 80 capsule 3    Sig: TAKE 1 CAPSULE BY MOUTH DAILY     Gastroenterology: Proton Pump Inhibitors Passed - 09/25/2023 10:25 PM      Passed - Valid encounter within last 12 months    Recent Outpatient Visits           2 months ago PAD (peripheral artery disease) (HCC)   [place] [facility] Mecum, Erin E, PA-C   4 months ago Burn of abdomen wall, first degree, initial encounter   [place] [facility] Pearley, Sherran Needs, NP   5 months ago PAD (peripheral artery disease) (HCC)   [place] [facility] Larae Grooms, NP   8 months ago Aortic atherosclerosis [facility])   [place] [facility] Larae Grooms, NP   10 months ago Aortic atherosclerosis [facility])   [place] [facility] Larae Grooms, NP       Future Appointments             In 3 weeks Larae Grooms, NP [place] [facility], [facility]            Refused Prescriptions Disp Refills   gabapentin (NEURONTIN) 300 MG capsule [Pharmacy Med Name: GABAPENTIN CAP 300MG  (NEUR)] 480 capsule 3    Sig: TAKE 3 CAPSULES BY MOUTH TWICE  DAILY     Neurology: Anticonvulsants - gabapentin Passed - 09/25/2023 10:25 PM      Passed - Cr in normal range and within 360 days    Creatinine  Date Value Ref Range Status  02/06/2012 0.76 0.60 - 1.30 mg/dL Final   Creatinine, Ser  Date Value Ref Range Status  09/09/2023 0.78 0.44 - 1.00 mg/dL Final         Passed - Completed PHQ-2 or PHQ-9 in the last 360 days      Passed - Valid encounter within  last 12 months    Recent Outpatient Visits           2 months ago PAD (peripheral artery disease) (HCC)   [place] [facility] Mecum, Erin E, PA-C   4 months ago Burn of abdomen wall, first degree, initial encounter   [place] [facility] Pearley, Sherran Needs, NP   5 months ago PAD (peripheral artery disease) [facility])   [place] [facility] Larae Grooms, NP   8 months ago Aortic atherosclerosis [facility])   [place] [facility] Larae Grooms, NP   10 months ago Aortic atherosclerosis [facility])   [place] [facility] Larae Grooms, NP       Future Appointments             In 3 weeks Larae Grooms, NP [place] [facility], [facility]               Requested Prescriptions  Pending Prescriptions Disp Refills   omeprazole (PRILOSEC) 20 MG capsule [Pharmacy Med Name: Omeprazole 20 MG Oral Capsule Delayed Release] 80 capsule 3    Sig: TAKE 1 CAPSULE BY MOUTH DAILY     Gastroenterology: Proton Pump Inhibitors Passed - 09/25/2023  10:25 PM      Passed - Valid encounter within last 12 months    Recent Outpatient Visits           2 months ago PAD (peripheral artery disease) (HCC)   [place] [facility] Mecum, Erin E, PA-C   4 months ago Burn of abdomen wall, first degree, initial encounter   [place] [facility] Pearley, Sherran Needs, NP   5 months ago PAD (peripheral artery disease) [facility])   [place] [facility] Larae Grooms, NP   8 months ago Aortic atherosclerosis [facility])   [place] [facility] Larae Grooms, NP   10 months ago Aortic atherosclerosis [facility])   [place] [facility] Larae Grooms, NP       Future Appointments             In 3 weeks Larae Grooms, NP [place] [facility], [facility]            Refused Prescriptions Disp Refills   gabapentin  (NEURONTIN) 300 MG capsule [Pharmacy Med Name: GABAPENTIN CAP 300MG  (NEUR)] 480 capsule 3    Sig: TAKE 3 CAPSULES BY MOUTH TWICE  DAILY     Neurology: Anticonvulsants - gabapentin Passed - 09/25/2023 10:25 PM      Passed - Cr in normal range and within 360 days    Creatinine  Date Value Ref Range Status  02/06/2012 0.76 0.60 - 1.30 mg/dL Final   Creatinine, Ser  Date Value Ref Range Status  09/09/2023 0.78 0.44 - 1.00 mg/dL Final         Passed - Completed PHQ-2 or PHQ-9 in the last 360 days      Passed - Valid encounter within last 12 months    Recent Outpatient Visits           2 months ago PAD (peripheral artery disease) (HCC)   [place] [facility] Mecum, Erin E, PA-C   4 months ago Burn of abdomen wall, first degree, initial encounter   [place] [facility] Pearley, Sherran Needs, NP   5 months ago PAD (peripheral artery disease) [facility])   [place] [facility] Larae Grooms, NP   8 months ago Aortic atherosclerosis [facility])   [place] [facility] Larae Grooms, NP   10 months ago Aortic atherosclerosis [facility])   [place] [facility] Larae Grooms, NP       Future Appointments             In 3 weeks Larae Grooms, NP [place] [facility], [facility]

## 2023-10-19 ENCOUNTER — Encounter: Payer: Self-pay | Admitting: Oncology

## 2023-10-20 ENCOUNTER — Ambulatory Visit: Payer: Medicare Other | Admitting: Nurse Practitioner

## 2023-10-20 NOTE — Telephone Encounter (Signed)
Push pet and my visit to march 2025

## 2023-10-21 ENCOUNTER — Ambulatory Visit: Payer: Medicare Other

## 2023-10-28 ENCOUNTER — Encounter (HOSPITAL_COMMUNITY): Payer: Medicare Other

## 2023-10-29 DIAGNOSIS — Z853 Personal history of malignant neoplasm of breast: Secondary | ICD-10-CM | POA: Diagnosis not present

## 2023-10-29 DIAGNOSIS — C50912 Malignant neoplasm of unspecified site of left female breast: Secondary | ICD-10-CM | POA: Diagnosis not present

## 2023-11-04 ENCOUNTER — Telehealth: Payer: Self-pay | Admitting: *Deleted

## 2023-11-04 ENCOUNTER — Ambulatory Visit: Payer: Medicare Other | Admitting: Gastroenterology

## 2023-11-04 NOTE — Telephone Encounter (Signed)
Patient's daughter called me back regarding appointment. Inform her that the appointment would be about discussing treatment which would included procedure regarding the Gi Bleed.  Both patient and daughter decline on patient getting a procedure, she stated that she is too old for that.  Noted. Decline appointment.

## 2023-11-12 ENCOUNTER — Ambulatory Visit: Payer: Medicare Other | Admitting: Oncology

## 2023-11-18 ENCOUNTER — Encounter: Payer: Self-pay | Admitting: Nurse Practitioner

## 2023-11-24 DIAGNOSIS — Z85830 Personal history of malignant neoplasm of bone: Secondary | ICD-10-CM | POA: Diagnosis not present

## 2023-11-24 DIAGNOSIS — Z08 Encounter for follow-up examination after completed treatment for malignant neoplasm: Secondary | ICD-10-CM | POA: Diagnosis not present

## 2023-12-10 DIAGNOSIS — R262 Difficulty in walking, not elsewhere classified: Secondary | ICD-10-CM | POA: Diagnosis not present

## 2023-12-10 DIAGNOSIS — I739 Peripheral vascular disease, unspecified: Secondary | ICD-10-CM | POA: Diagnosis not present

## 2023-12-10 DIAGNOSIS — K922 Gastrointestinal hemorrhage, unspecified: Secondary | ICD-10-CM | POA: Diagnosis not present

## 2023-12-10 DIAGNOSIS — I10 Essential (primary) hypertension: Secondary | ICD-10-CM | POA: Diagnosis not present

## 2023-12-10 DIAGNOSIS — D649 Anemia, unspecified: Secondary | ICD-10-CM | POA: Diagnosis not present

## 2023-12-10 DIAGNOSIS — G301 Alzheimer's disease with late onset: Secondary | ICD-10-CM | POA: Diagnosis not present

## 2023-12-10 DIAGNOSIS — Z9181 History of falling: Secondary | ICD-10-CM | POA: Diagnosis not present

## 2023-12-10 DIAGNOSIS — R2689 Other abnormalities of gait and mobility: Secondary | ICD-10-CM | POA: Diagnosis not present

## 2023-12-10 DIAGNOSIS — C7951 Secondary malignant neoplasm of bone: Secondary | ICD-10-CM | POA: Diagnosis not present

## 2023-12-10 DIAGNOSIS — E785 Hyperlipidemia, unspecified: Secondary | ICD-10-CM | POA: Diagnosis not present

## 2023-12-10 DIAGNOSIS — G629 Polyneuropathy, unspecified: Secondary | ICD-10-CM | POA: Diagnosis not present

## 2023-12-10 DIAGNOSIS — M6281 Muscle weakness (generalized): Secondary | ICD-10-CM | POA: Diagnosis not present

## 2023-12-10 DIAGNOSIS — I700 Atherosclerosis of aorta: Secondary | ICD-10-CM | POA: Diagnosis not present

## 2023-12-10 DIAGNOSIS — K219 Gastro-esophageal reflux disease without esophagitis: Secondary | ICD-10-CM | POA: Diagnosis not present

## 2023-12-17 ENCOUNTER — Encounter: Payer: Self-pay | Admitting: Nurse Practitioner

## 2023-12-17 DIAGNOSIS — Z789 Other specified health status: Secondary | ICD-10-CM

## 2023-12-17 DIAGNOSIS — M545 Low back pain, unspecified: Secondary | ICD-10-CM

## 2023-12-23 ENCOUNTER — Encounter (INDEPENDENT_AMBULATORY_CARE_PROVIDER_SITE_OTHER): Payer: Medicare Other | Admitting: Nurse Practitioner

## 2023-12-24 DIAGNOSIS — M6281 Muscle weakness (generalized): Secondary | ICD-10-CM | POA: Diagnosis not present

## 2023-12-24 DIAGNOSIS — K922 Gastrointestinal hemorrhage, unspecified: Secondary | ICD-10-CM | POA: Diagnosis not present

## 2023-12-24 DIAGNOSIS — R262 Difficulty in walking, not elsewhere classified: Secondary | ICD-10-CM | POA: Diagnosis not present

## 2023-12-26 ENCOUNTER — Telehealth: Payer: Self-pay | Admitting: *Deleted

## 2023-12-26 DIAGNOSIS — M6281 Muscle weakness (generalized): Secondary | ICD-10-CM | POA: Diagnosis not present

## 2023-12-26 DIAGNOSIS — K922 Gastrointestinal hemorrhage, unspecified: Secondary | ICD-10-CM | POA: Diagnosis not present

## 2023-12-26 DIAGNOSIS — R262 Difficulty in walking, not elsewhere classified: Secondary | ICD-10-CM | POA: Diagnosis not present

## 2023-12-26 NOTE — Progress Notes (Unsigned)
Complex Care Management Note Care Guide Note  12/26/2023 Name: Kelli Williams MRN: [id number] DOB: May 10, 1926   Complex Care Management Outreach Attempts: An unsuccessful telephone outreach was attempted today to offer the patient information about available complex care management services.  Follow Up Plan:  Additional outreach attempts will be made to offer the patient complex care management information and services.   Encounter Outcome:  No Answer  Burman Nieves, CMA, Care Guide [facility]  [facility] Guide Direct Dial: [phone]  Fax: [phone] Website: [email or web address]

## 2023-12-29 NOTE — Telephone Encounter (Signed)
Hi Kelli Williams! I spoke with home health and they are unable to start the process because she has not had a face to face in the last 90 days. Please let me know what you would like me to do.

## 2023-12-31 ENCOUNTER — Encounter (INDEPENDENT_AMBULATORY_CARE_PROVIDER_SITE_OTHER): Payer: Medicare Other | Admitting: Nurse Practitioner

## 2023-12-31 ENCOUNTER — Encounter (INDEPENDENT_AMBULATORY_CARE_PROVIDER_SITE_OTHER): Payer: Medicare Other

## 2023-12-31 NOTE — Progress Notes (Signed)
Complex Care Management Note  Care Guide Note 12/31/2023 Name: Kelli Williams MRN: [id number] DOB: 1926-03-01  Kelli Williams is a 89 y.o. year old female who sees Larae Grooms, NP for primary care. I reached out to [employer] by phone today to offer complex care management services.  [name] was given information about Complex Care Management services today including:   The Complex Care Management services include support from the care team which includes your Nurse Coordinator, Clinical Social Worker, or Pharmacist.  The Complex Care Management team is here to help remove barriers to the health concerns and goals most important to you. Complex Care Management services are voluntary, and the patient may decline or stop services at any time by request to their care team member.   Complex Care Management Consent Status: Patient did not agree to participate in complex care management services at this time.  Follow up plan:  spoke with daughter Kelli Williams who says pt is in SNF and not expected to return home.   Encounter Outcome:  Patient Refused  Burman Nieves, CMA, Care Guide [facility]  [facility], [facility] Guide Direct Dial: [phone]  Fax: [phone] Website: [email or web address]

## 2024-01-03 DIAGNOSIS — K922 Gastrointestinal hemorrhage, unspecified: Secondary | ICD-10-CM | POA: Diagnosis not present

## 2024-01-03 DIAGNOSIS — M6281 Muscle weakness (generalized): Secondary | ICD-10-CM | POA: Diagnosis not present

## 2024-01-03 DIAGNOSIS — R262 Difficulty in walking, not elsewhere classified: Secondary | ICD-10-CM | POA: Diagnosis not present

## 2024-01-06 ENCOUNTER — Encounter: Payer: Self-pay | Admitting: Emergency Medicine

## 2024-01-06 DIAGNOSIS — K922 Gastrointestinal hemorrhage, unspecified: Secondary | ICD-10-CM | POA: Diagnosis not present

## 2024-01-06 DIAGNOSIS — I739 Peripheral vascular disease, unspecified: Secondary | ICD-10-CM | POA: Diagnosis not present

## 2024-01-06 DIAGNOSIS — I700 Atherosclerosis of aorta: Secondary | ICD-10-CM | POA: Diagnosis not present

## 2024-01-06 DIAGNOSIS — I10 Essential (primary) hypertension: Secondary | ICD-10-CM | POA: Diagnosis not present

## 2024-01-06 DIAGNOSIS — Z0000 Encounter for general adult medical examination without abnormal findings: Secondary | ICD-10-CM | POA: Diagnosis not present

## 2024-01-06 DIAGNOSIS — R262 Difficulty in walking, not elsewhere classified: Secondary | ICD-10-CM | POA: Diagnosis not present

## 2024-01-06 DIAGNOSIS — M6281 Muscle weakness (generalized): Secondary | ICD-10-CM | POA: Diagnosis not present

## 2024-01-08 ENCOUNTER — Ambulatory Visit: Payer: Medicare Other

## 2024-01-08 VITALS — Ht 63.0 in | Wt 190.0 lb

## 2024-01-08 DIAGNOSIS — Z0000 Encounter for general adult medical examination without abnormal findings: Secondary | ICD-10-CM

## 2024-01-08 NOTE — Progress Notes (Signed)
Subjective:   Kelli Williams is a 89 y.o. female who presents for Medicare Annual (Subsequent) preventive examination.  This patient declined Interactive audio and Acupuncturist. Therefore the visit was completed with audio only.   Visit Complete: Virtual I connected with  Kelli Williams on 01/08/24 by a audio enabled telemedicine application and verified that I am speaking with the correct person using two identifiers.  Patient Location: Skilled Nursing Facility  Provider Location: Home Office  I discussed the limitations of evaluation and management by telemedicine. The patient expressed understanding and agreed to proceed.  Vital Signs: Because this visit was a virtual/telehealth visit, some criteria may be missing or patient reported. Any vitals not documented were not able to be obtained and vitals that have been documented are patient reported.   Cardiac Risk Factors include: advanced age (>42men, >9 women);dyslipidemia;hypertension;obesity (BMI >30kg/m2);sedentary lifestyle     Objective:    Today's Vitals   01/08/24 1107  Weight: 190 lb (86.2 kg)  Height: 5\' 3"  (1.6 m)   Body mass index is 33.66 kg/m.     01/08/2024   11:25 AM 09/06/2023    4:45 PM 09/06/2023    7:00 AM 04/18/2023   11:13 AM 12/13/2022    1:06 PM 10/22/2022    1:10 PM 08/09/2022    1:25 PM  Advanced Directives  Does Patient Have a Medical Advance Directive? Yes Yes Yes Yes Yes No Yes  Type of Estate agent of [place];Living will Healthcare Power of [place];Living will;Out of facility DNR (pink MOST or yellow form) Healthcare Power of [employer] of [place];Living will Healthcare Power of [place];Living will  Out of facility DNR (pink MOST or yellow form)  Does patient want to make changes to medical advance directive? No - Patient declined No - Patient declined No - Patient declined      Copy of Healthcare Power of Attorney in Chart? Yes - validated  most recent copy scanned in chart (See row information) Yes - validated most recent copy scanned in chart (See row information) No - copy requested  No - copy requested No - copy requested     Current Medications (verified) Outpatient Encounter Medications as of 01/08/2024  Medication Sig   doxazosin (CARDURA) 1 MG tablet TAKE 1 TABLET BY MOUTH TWICE  DAILY   ezetimibe (ZETIA) 10 MG tablet TAKE 1 TABLET BY MOUTH DAILY   gabapentin (NEURONTIN) 300 MG capsule Take 3 capsules (900 mg total) by mouth 2 (two) times daily.   lisinopril (ZESTRIL) 40 MG tablet Take 40 mg by mouth daily.   omeprazole (PRILOSEC) 20 MG capsule TAKE 1 CAPSULE BY MOUTH DAILY   tamoxifen (NOLVADEX) 20 MG tablet TAKE 1 TABLET BY MOUTH DAILY   No facility-administered encounter medications on file as of 01/08/2024.    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   Complication of anesthesia    WOKE UP DURING SURGERY FOR DEVIATED SEPTUM, KNEE REPLACEMENT AND BREAST LUMPECTOMY   Dysrhythmia    GERD (gastroesophageal reflux disease)    Hyperlipidemia 09/06/2023   Hypertension  Malignant neoplasm metastatic to bone (HCC) 09/23/2017   Malignant neoplasm metastatic to bone (HCC) 09/23/2017   Neuropathic pain of both legs    Neuropathy    Personal history of chemotherapy    Personal history of radiation therapy    Pneumonia 2016   Skin cancer of forehead    Skin cancer of nose    Past Surgical History:  Procedure Laterality Date   ABDOMINAL HYSTERECTOMY     AXILLARY LYMPH NODE BIOPSY Left 07/10/2017   INVASIVE MAMMARY CARCINOMA WITH FOCAL  LOBULAR FEATURES.    BREAST BIOPSY Bilateral 1960   benign   BREAST LUMPECTOMY Left 2000   breast ca with rad tx   BREAST LUMPECTOMY Left 08/20/2017   BREAST LUMPECTOMY Left 08/20/2017   Procedure: left breast wide excision;  Surgeon: Earline Mayotte, MD;  Location: [facility] ORS;  Service: General;  Laterality: Left;   CATARACT EXTRACTION     left eye   CHOLECYSTECTOMY     JOINT REPLACEMENT     NASAL SEPTUM SURGERY     PARTIAL KNEE ARTHROPLASTY     right   TOTAL VAGINAL HYSTERECTOMY     Family History  Problem Relation Age of Onset   Diabetes Mother    Heart disease Mother    Congestive Heart Failure Mother    Sudden death Father 50   Breast cancer Daughter 87       genetic negative   Heart attack Paternal Uncle    Cancer Paternal Grandmother    Social History   Socioeconomic History   Marital status: Widowed    Spouse name: Not on file   Number of children: 3   Years of education: some college   Highest education level: 12th grade  Occupational History   Occupation: Retired  Tobacco Use   Smoking status: Never   Smokeless tobacco: Never   Tobacco comments:    smoking cessation materials not required  Vaping Use   Vaping status: Never Used  Substance and Sexual Activity   Alcohol use: No    Alcohol/week: 0.0 standard drinks of alcohol   Drug use: No   Sexual activity: Not Currently  Other Topics Concern   Not on file  Social History Narrative   01/08/24 Patient lives in skilled nursing at [facility]       Social Drivers of Health   Financial Resource Strain: Low Risk  (01/08/2024)   Overall Financial Resource Strain (CARDIA)    Difficulty of Paying Living Expenses: Not hard at all  Food Insecurity: No Food Insecurity (01/08/2024)   Hunger Vital Sign    Worried About Running Out of Food in the Last Year: Never true    Ran Out of Food in the Last Year: Never true  Transportation Needs: No Transportation Needs (01/08/2024)   PRAPARE - Scientist, research (physical sciences) (Medical): No    Lack of Transportation (Non-Medical): No  Physical Activity: Inactive (01/08/2024)   Exercise Vital Sign    Days of Exercise per Week: 0 days    Minutes of Exercise per Session: 0 min  Stress: No Stress Concern Present (01/08/2024)   [employer] of Occupational Health - Occupational Stress Questionnaire    Feeling of Stress : Not at all  Social Connections: Socially Isolated (01/08/2024)   Social Connection and Isolation Panel [NHANES]    Frequency of Communication with Friends and Family: More than three times a week    Frequency of Social Gatherings with Friends and Family: Once  a week    Attends Religious Services: Never    Active Member of Clubs or Organizations: No    Attends Banker Meetings: Never    Marital Status: Widowed    Tobacco Counseling Counseling given: Not Answered Tobacco comments: smoking cessation materials not required   Clinical Intake:  Pre-visit preparation completed: Yes  Pain : No/denies pain     BMI - recorded: 33.66 Nutritional Status: BMI > 30  Obese Nutritional Risks: None Diabetes: No  How often do you need to have someone help you when you read instructions, pamphlets, or other written materials from your doctor or pharmacy?: 1 - Never  Interpreter Needed?: No  Information entered by :: Tora Kindred, CMA   Activities of Daily Living    01/08/2024   11:12 AM 09/06/2023    4:45 PM  In your present state of health, do you have any difficulty performing the following activities:  Hearing? 0 0  Vision? 0 0  Difficulty concentrating or making decisions? 0 0  Walking or climbing stairs? 1   Comment uses a wheelchair in skilled nursing facility   Dressing or bathing? 1   Comment skilled nursing   Doing errands, shopping? 1 0  Comment skilled nursing   Preparing Food and eating ? Y   Comment skilled nursing prepares food, she can eat independently   Using the Toilet? Y   In the past six  months, have you accidently leaked urine? Y   Comment incontinent, wears adult underwear   Do you have problems with loss of bowel control? N   Managing your Medications? Y   Comment skilled nursing   Managing your Finances? Y   Housekeeping or managing your Housekeeping? Y   Comment skilled nursing     Patient Care Team: Larae Grooms, NP as PCP - General (Nurse Practitioner) Antonieta Iba, MD as Consulting Physician (Cardiology) Ronnette Juniper as Physician Assistant (Orthopedic Surgery) Lemar Livings Merrily Pew, MD (General Surgery) Kennedy Bucker, MD as Consulting Physician (Orthopedic Surgery) Creig Hines, MD as Consulting Physician (Hematology and Oncology)  Indicate any recent Medical Services you may have received from other than [facility] providers in the past year (date may be approximate).     Assessment:   This is a routine wellness examination for Kelli Williams.  Hearing/Vision screen Hearing Screening - Comments:: Denies hearing loss Vision Screening - Comments:: Has not been able to get an eye exam. She has to arrange transportation   Goals Addressed               This Visit's Progress     Patient Stated (pt-stated)        Get back in physical therapy and get stronger      Depression Screen    01/08/2024   11:21 AM 07/18/2023    1:30 PM 05/08/2023    4:56 PM 04/10/2023    2:09 PM 01/08/2023    2:14 PM 11/05/2022    1:06 PM 10/22/2022    1:21 PM  PHQ 2/9 Scores  PHQ - 2 Score 1 1 0 0 0 0 1  PHQ- 9 Score  4 3 2 2 1 3     Fall Risk    01/08/2024   11:26 AM 07/18/2023    1:30 PM 04/10/2023    2:09 PM 01/08/2023    2:14 PM 01/08/2023    2:11 PM  Fall Risk   Falls in the past year? 0 1 1 0 0  Number  falls in past yr: 0 0 0 0 0  Injury with Fall? 0 0 0 0 0  Risk for fall due to : Impaired balance/gait;Impaired mobility No Fall Risks Impaired balance/gait;Impaired mobility No Fall Risks Impaired balance/gait;Impaired mobility  Follow up Education  provided;Falls prevention discussed;Falls evaluation completed Falls evaluation completed Falls evaluation completed Falls evaluation completed     MEDICARE RISK AT HOME: Medicare Risk at Home Any stairs in or around the home?: No If so, are there any without handrails?: No Home free of loose throw rugs in walkways, pet beds, electrical cords, etc?: Yes Adequate lighting in your home to reduce risk of falls?: Yes Life alert?: No Use of a cane, walker or w/c?: Yes (wheelchair) Grab bars in the bathroom?: Yes Shower chair or bench in shower?: Yes Elevated toilet seat or a handicapped toilet?: Yes  TIMED UP AND GO:  Was the test performed?  No    Cognitive Function:        01/08/2024   11:28 AM 10/22/2022    1:14 PM 10/13/2019   10:39 AM 06/29/2018   11:26 AM 06/27/2017   10:47 AM  6CIT Screen  What Year? 0 points 0 points 0 points 0 points 0 points  What month? 0 points 0 points 0 points 0 points 0 points  What time? 0 points 0 points 0 points 0 points 0 points  Count back from 20 0 points 0 points 0 points 0 points 0 points  Months in reverse 0 points 0 points 0 points 0 points 0 points  Repeat phrase 0 points 0 points 0 points 0 points 2 points  Total Score 0 points 0 points 0 points 0 points 2 points    Immunizations Immunization History  Administered Date(s) Administered   Fluad Quad(high Dose 65+) 10/12/2019, 09/25/2020, 11/05/2022   Influenza, High Dose Seasonal PF 09/23/2017, 09/10/2018, 10/01/2021   Influenza,inj,Quad PF,6+ Mos 10/16/2016   Influenza-Unspecified 09/02/2015, 09/11/2023   Moderna Covid-19 Fall Seasonal Vaccine 60yrs & older 01/01/2023   Moderna Covid-19 Vaccine Bivalent Booster 37yrs & up 10/01/2021   Moderna Sars-Covid-2 Vaccination 10/01/2021, 09/11/2023   PFIZER(Purple Top)SARS-COV-2 Vaccination 12/16/2019, 01/12/2020, 11/08/2020   PNEUMOCOCCAL CONJUGATE-20 09/24/2023   Pneumococcal Conjugate-13 09/08/2014   Pneumococcal Polysaccharide-23  09/14/1994, 04/30/1997   Pneumococcal-Unspecified 09/14/1994   Respiratory Syncytial Virus Vaccine,Recomb Aduvanted(Arexvy) 10/23/2023   Tdap 06/09/2011   Zoster, Live 05/12/2006    TDAP status: Due, Education has been provided regarding the importance of this vaccine. Advised may receive this vaccine at local pharmacy or Health Dept. Aware to provide a copy of the vaccination record if obtained from local pharmacy or Health Dept. Verbalized acceptance and understanding.  Flu Vaccine status: Up to date  Pneumococcal vaccine status: Up to date  Covid-19 vaccine status: Completed vaccines  Qualifies for Shingles Vaccine? Yes   Zostavax completed Yes   Shingrix Completed?: No.    Education has been provided regarding the importance of this vaccine. Patient has been advised to call insurance company to determine out of pocket expense if they have not yet received this vaccine. Advised may also receive vaccine at local pharmacy or Health Dept. Verbalized acceptance and understanding.  Screening Tests Health Maintenance  Topic Date Due   Zoster Vaccines- Shingrix (1 of 2) 12/31/1944   DTaP/Tdap/Td (2 - Td or Tdap) 06/08/2021   COVID-19 Vaccine (7 - 2024-25 season) 11/06/2023   Medicare Annual Wellness (AWV)  01/07/2025   Pneumonia Vaccine 34+ Years old  Completed   INFLUENZA VACCINE  Completed  DEXA SCAN  Completed   HPV VACCINES  Aged Out    Health Maintenance  Health Maintenance Due  Topic Date Due   Zoster Vaccines- Shingrix (1 of 2) 12/31/1944   DTaP/Tdap/Td (2 - Td or Tdap) 06/08/2021   COVID-19 Vaccine (7 - 2024-25 season) 11/06/2023    Colorectal cancer screening: No longer required.   Mammogram status: No longer required due to age.  Bone Density Status: no longer required due to age  Lung Cancer Screening: (Low Dose CT Chest recommended if Age 29-80 years, 20 pack-year currently smoking OR have quit w/in 15years.) does not qualify.   Lung Cancer Screening  Referral: n/a  Additional Screening:  Hepatitis C Screening: does not qualify;   Vision Screening: Recommended annual ophthalmology exams for early detection of glaucoma and other disorders of the eye.  Dental Screening: Recommended annual dental exams for proper oral hygiene  Community Resource Referral / Chronic Care Management: CRR required this visit?  No   CCM required this visit?  No     Plan:     I have personally reviewed and noted the following in the patient's chart:   Medical and social history Use of alcohol, tobacco or illicit drugs  Current medications and supplements including opioid prescriptions. Patient is not currently taking opioid prescriptions. Functional ability and status Nutritional status Physical activity Advanced directives List of other physicians Hospitalizations, surgeries, and ER visits in previous 12 months Vitals Screenings to include cognitive, depression, and falls Referrals and appointments  In addition, I have reviewed and discussed with patient certain preventive protocols, quality metrics, and best practice recommendations. A written personalized care plan for preventive services as well as general preventive health recommendations were provided to patient.     Tora Kindred, CMA   01/08/2024   After Visit Summary: (MyChart) Due to this being a telephonic visit, the after visit summary with patients personalized plan was offered to patient via MyChart   Nurse Notes:  Patient currently in skilled nursing at [facility] Needs eye exam Needs Tdap vaccine Needs OV with PCP. I scheduled OV for 01/12/24, but patient cancelled. She couldn't arrange for her daughter to be with at that time. She states she will reschedule OV.

## 2024-01-08 NOTE — Patient Instructions (Signed)
[name] , Thank you for taking time to come for your Medicare Wellness Visit. I appreciate your ongoing commitment to your health goals. Please review the following plan we discussed and let me know if I can assist you in the future.   Referrals/Orders/Follow-Ups/Clinician Recommendations: Call and reschedule your appointment with Larae Grooms, NP. Try to get an eye exam. You are due to a tetanus vaccine.  This is a list of the screening recommended for you and due dates:  Health Maintenance  Topic Date Due   Zoster (Shingles) Vaccine (1 of 2) 12/31/1944   DTaP/Tdap/Td vaccine (2 - Td or Tdap) 06/08/2021   COVID-19 Vaccine (7 - 2024-25 season) 11/06/2023   Medicare Annual Wellness Visit  01/07/2025   Pneumonia Vaccine  Completed   Flu Shot  Completed   DEXA scan (bone density measurement)  Completed   HPV Vaccine  Aged Out    Advanced directives: (In Chart) A copy of your advanced directives are scanned into your chart should your provider ever need it.  Next Medicare Annual Wellness Visit scheduled for next year: No  Fall Prevention in the Home, Adult Falls can cause injuries and affect people of all ages. There are many simple things that you can do to make your home safe and to help prevent falls. If you need it, ask for help making these changes. What actions can I take to prevent falls? General information Use good lighting in all rooms. Make sure to: Replace any light bulbs that burn out. Turn on lights if it is dark and use night-lights. Keep items that you use often in easy-to-reach places. Lower the shelves around your home if needed. Move furniture so that there are clear paths around it. Do not keep throw rugs or other things on the floor that can make you trip. If any of your floors are uneven, fix them. Add color or contrast paint or tape to clearly mark and help you see: Grab bars or handrails. First and last steps of staircases. Where the edge of each step  is. If you use a ladder or stepladder: Make sure that it is fully opened. Do not climb a closed ladder. Make sure the sides of the ladder are locked in place. Have someone hold the ladder while you use it. Know where your pets are as you move through your home. What can I do in the bathroom?     Keep the floor dry. Clean up any water that is on the floor right away. Remove soap buildup in the bathtub or shower. Buildup makes bathtubs and showers slippery. Use non-skid mats or decals on the floor of the bathtub or shower. Attach bath mats securely with double-sided, non-slip rug tape. If you need to sit down while you are in the shower, use a non-slip stool. Install grab bars by the toilet and in the bathtub and shower. Do not use towel bars as grab bars. What can I do in the bedroom? Make sure that you have a light by your bed that is easy to reach. Do not use any sheets or blankets on your bed that hang to the floor. Have a firm bench or chair with side arms that you can use for support when you get dressed. What can I do in the kitchen? Clean up any spills right away. If you need to reach something above you, use a sturdy step stool that has a grab bar. Keep electrical cables out of the way. Do not  use floor polish or wax that makes floors slippery. What can I do with my stairs? Do not leave anything on the stairs. Make sure that you have a light switch at the top and the bottom of the stairs. Have them installed if you do not have them. Make sure that there are handrails on both sides of the stairs. Fix handrails that are broken or loose. Make sure that handrails are as long as the staircases. Install non-slip stair treads on all stairs in your home if they do not have carpet. Avoid having throw rugs at the top or bottom of stairs, or secure the rugs with carpet tape to prevent them from moving. Choose a carpet design that does not hide the edge of steps on the stairs. Make sure  that carpet is firmly attached to the stairs. Fix any carpet that is loose or worn. What can I do on the outside of my home? Use bright outdoor lighting. Repair the edges of walkways and driveways and fix any cracks. Clear paths of anything that can make you trip, such as tools or rocks. Add color or contrast paint or tape to clearly mark and help you see high doorway thresholds. Trim any bushes or trees on the main path into your home. Check that handrails are securely fastened and in good repair. Both sides of all steps should have handrails. Install guardrails along the edges of any raised decks or porches. Have leaves, snow, and ice cleared regularly. Use sand, salt, or ice melt on walkways during winter months if you live where there is ice and snow. In the garage, clean up any spills right away, including grease or oil spills. What other actions can I take? Review your medicines with your health care provider. Some medicines can make you confused or feel dizzy. This can increase your chance of falling. Wear closed-toe shoes that fit well and support your feet. Wear shoes that have rubber soles and low heels. Use a cane, walker, scooter, or crutches that help you move around if needed. Talk with your provider about other ways that you can decrease your risk of falls. This may include seeing a physical therapist to learn to do exercises to improve movement and strength. Where to find more information Centers for Disease Control and Prevention, STEADI: TonerPromos.no [employer] on Aging: BaseRingTones.pl National Institute on Aging: BaseRingTones.pl Contact a health care provider if: You are afraid of falling at home. You feel weak, drowsy, or dizzy at home. You fall at home. Get help right away if you: Lose consciousness or have trouble moving after a fall. Have a fall that causes a head injury. These symptoms may be an emergency. Get help right away. Call 911. Do not wait to see if the  symptoms will go away. Do not drive yourself to the hospital. This information is not intended to replace advice given to you by your health care provider. Make sure you discuss any questions you have with your health care provider. Document Revised: 07/29/2022 Document Reviewed: 07/29/2022 Elsevier Patient Education  2024 [employer].

## 2024-01-12 ENCOUNTER — Ambulatory Visit: Payer: Medicare Other | Admitting: Nurse Practitioner

## 2024-02-06 DIAGNOSIS — I10 Essential (primary) hypertension: Secondary | ICD-10-CM | POA: Diagnosis not present

## 2024-02-06 DIAGNOSIS — C7951 Secondary malignant neoplasm of bone: Secondary | ICD-10-CM | POA: Diagnosis not present

## 2024-02-06 DIAGNOSIS — C50919 Malignant neoplasm of unspecified site of unspecified female breast: Secondary | ICD-10-CM | POA: Diagnosis not present

## 2024-02-17 ENCOUNTER — Other Ambulatory Visit: Payer: Medicare Other

## 2024-02-20 ENCOUNTER — Ambulatory Visit
Admission: RE | Admit: 2024-02-20 | Discharge: 2024-02-20 | Disposition: A | Discharge status: Home / Self Care | Payer: Medicare Other | Source: Ambulatory Visit | Attending: Oncology | Admitting: Oncology

## 2024-02-20 DIAGNOSIS — C50919 Malignant neoplasm of unspecified site of unspecified female breast: Secondary | ICD-10-CM | POA: Diagnosis not present

## 2024-02-20 DIAGNOSIS — C7951 Secondary malignant neoplasm of bone: Secondary | ICD-10-CM | POA: Diagnosis not present

## 2024-02-20 DIAGNOSIS — J439 Emphysema, unspecified: Secondary | ICD-10-CM | POA: Diagnosis not present

## 2024-02-20 DIAGNOSIS — I700 Atherosclerosis of aorta: Secondary | ICD-10-CM | POA: Diagnosis not present

## 2024-02-20 DIAGNOSIS — K449 Diaphragmatic hernia without obstruction or gangrene: Secondary | ICD-10-CM | POA: Diagnosis not present

## 2024-02-20 DIAGNOSIS — M899 Disorder of bone, unspecified: Secondary | ICD-10-CM | POA: Diagnosis not present

## 2024-02-20 DIAGNOSIS — K5730 Diverticulosis of large intestine without perforation or abscess without bleeding: Secondary | ICD-10-CM | POA: Diagnosis not present

## 2024-02-20 LAB — GLUCOSE, CAPILLARY: Glucose-Capillary: 81 mg/dL (ref 70–99)

## 2024-02-20 MED ORDER — FLUDEOXYGLUCOSE F - 18 (FDG) INJECTION
10.3400 | Freq: Once | INTRAVENOUS | Status: AC | PRN
  Administered 2024-02-20: 10.34 via INTRAVENOUS

## 2024-03-01 ENCOUNTER — Encounter (INDEPENDENT_AMBULATORY_CARE_PROVIDER_SITE_OTHER): Payer: Medicare Other

## 2024-03-01 ENCOUNTER — Encounter (INDEPENDENT_AMBULATORY_CARE_PROVIDER_SITE_OTHER): Payer: Medicare Other | Admitting: Vascular Surgery

## 2024-03-03 ENCOUNTER — Ambulatory Visit: Payer: Medicare Other | Admitting: Oncology

## 2024-03-08 ENCOUNTER — Telehealth: Payer: Self-pay | Admitting: *Deleted

## 2024-03-08 NOTE — Telephone Encounter (Signed)
 Patient wanted staff to call about her appt. It is 4/1 at 11:30 and she will get PET scan results and no labs. Pam will let her know

## 2024-03-09 ENCOUNTER — Inpatient Hospital Stay: Payer: Medicare Other | Attending: Oncology | Admitting: Oncology

## 2024-03-09 ENCOUNTER — Inpatient Hospital Stay

## 2024-03-09 VITALS — BP 122/80 | HR 60 | Temp 98.4°F | Resp 19 | Ht 63.0 in | Wt 198.0 lb

## 2024-03-09 DIAGNOSIS — C50912 Malignant neoplasm of unspecified site of left female breast: Secondary | ICD-10-CM | POA: Insufficient documentation

## 2024-03-09 DIAGNOSIS — Z9221 Personal history of antineoplastic chemotherapy: Secondary | ICD-10-CM | POA: Diagnosis not present

## 2024-03-09 DIAGNOSIS — Z5181 Encounter for therapeutic drug level monitoring: Secondary | ICD-10-CM

## 2024-03-09 DIAGNOSIS — C7951 Secondary malignant neoplasm of bone: Secondary | ICD-10-CM | POA: Insufficient documentation

## 2024-03-09 DIAGNOSIS — R5383 Other fatigue: Secondary | ICD-10-CM | POA: Insufficient documentation

## 2024-03-09 DIAGNOSIS — Z170 Estrogen receptor positive status [ER+]: Secondary | ICD-10-CM | POA: Diagnosis not present

## 2024-03-09 DIAGNOSIS — D509 Iron deficiency anemia, unspecified: Secondary | ICD-10-CM

## 2024-03-09 DIAGNOSIS — G8929 Other chronic pain: Secondary | ICD-10-CM | POA: Diagnosis not present

## 2024-03-09 DIAGNOSIS — C50919 Malignant neoplasm of unspecified site of unspecified female breast: Secondary | ICD-10-CM | POA: Diagnosis not present

## 2024-03-09 DIAGNOSIS — Z85828 Personal history of other malignant neoplasm of skin: Secondary | ICD-10-CM | POA: Insufficient documentation

## 2024-03-09 DIAGNOSIS — Z79810 Long term (current) use of selective estrogen receptor modulators (SERMs): Secondary | ICD-10-CM | POA: Diagnosis not present

## 2024-03-09 DIAGNOSIS — Z923 Personal history of irradiation: Secondary | ICD-10-CM | POA: Insufficient documentation

## 2024-03-09 DIAGNOSIS — M549 Dorsalgia, unspecified: Secondary | ICD-10-CM | POA: Diagnosis not present

## 2024-03-09 LAB — CBC WITH DIFFERENTIAL (CANCER CENTER ONLY)
Abs Immature Granulocytes: 0.02 10*3/uL (ref 0.00–0.07)
Basophils Absolute: 0.1 10*3/uL (ref 0.0–0.1)
Basophils Relative: 1 %
Eosinophils Absolute: 0.2 10*3/uL (ref 0.0–0.5)
Eosinophils Relative: 4 %
HCT: 36.9 % (ref 36.0–46.0)
Hemoglobin: 11.8 g/dL — ABNORMAL LOW (ref 12.0–15.0)
Immature Granulocytes: 0 %
Lymphocytes Relative: 20 %
Lymphs Abs: 1.3 10*3/uL (ref 0.7–4.0)
MCH: 28.1 pg (ref 26.0–34.0)
MCHC: 32 g/dL (ref 30.0–36.0)
MCV: 87.9 fL (ref 80.0–100.0)
Monocytes Absolute: 0.5 10*3/uL (ref 0.1–1.0)
Monocytes Relative: 7 %
Neutro Abs: 4.4 10*3/uL (ref 1.7–7.7)
Neutrophils Relative %: 68 %
Platelet Count: 197 10*3/uL (ref 150–400)
RBC: 4.2 MIL/uL (ref 3.87–5.11)
RDW: 17.3 % — ABNORMAL HIGH (ref 11.5–15.5)
WBC Count: 6.4 10*3/uL (ref 4.0–10.5)
nRBC: 0 % (ref 0.0–0.2)

## 2024-03-09 LAB — VITAMIN B12: Vitamin B-12: 250 pg/mL (ref 180–914)

## 2024-03-09 LAB — IRON AND TIBC
Iron: 89 ug/dL (ref 28–170)
Saturation Ratios: 25 % (ref 10.4–31.8)
TIBC: 361 ug/dL (ref 250–450)
UIBC: 272 ug/dL

## 2024-03-09 LAB — FERRITIN: Ferritin: 16 ng/mL (ref 11–307)

## 2024-03-09 LAB — FOLATE: Folate: >40 ng/mL (ref >5.9)

## 2024-03-09 LAB — TSH: TSH: 1.538 u[IU]/mL (ref 0.350–4.500)

## 2024-03-12 DIAGNOSIS — C50919 Malignant neoplasm of unspecified site of unspecified female breast: Secondary | ICD-10-CM | POA: Diagnosis not present

## 2024-03-12 DIAGNOSIS — I10 Essential (primary) hypertension: Secondary | ICD-10-CM | POA: Diagnosis not present

## 2024-03-12 DIAGNOSIS — C7951 Secondary malignant neoplasm of bone: Secondary | ICD-10-CM | POA: Diagnosis not present

## 2024-03-25 DIAGNOSIS — C801 Malignant (primary) neoplasm, unspecified: Secondary | ICD-10-CM | POA: Diagnosis not present

## 2024-03-25 DIAGNOSIS — C7951 Secondary malignant neoplasm of bone: Secondary | ICD-10-CM | POA: Diagnosis not present

## 2024-03-29 NOTE — Progress Notes (Signed)
 Hematology/Oncology Consult note [facility]  [phone] [phone]  Patient Care Team: Aileen Alexanders, NP as PCP - General (Nurse Practitioner) Devorah Fonder, MD as Consulting Physician (Cardiology) Avonne Boettcher, MD as Consulting Physician (Hematology and Oncology)   Name of the patient: Kelli Williams  [id number]  11-May-1926   Date of visit: 03/29/24  Diagnosis-metastatic ER positive breast cancer  Chief complaint/ Reason for visit-routine follow-up of breast cancer  Heme/Onc history: Patient is a 89 year old female who was previously seeing Dr. Beverely Buba and is now transferring her care to me.  She was diagnosed with left breast cancer in August 2000 and NTU.  It was a 0.8 cm grade 2 invasive mammary carcinoma ER 50% positive PR 90% positive.  She underwent adjuvant radiation treatment and completed 5 years of tamoxifen  in July 2005.  She was noted to have metastatic breast cancer on left axillary biopsy in August 2018.  Pathology showed 1.1 cm grade 2 invasive mammary carcinoma with focal lobular features.  1 out of 4 axillary lymph nodes with extracapsular extension.  Tumor greater than 90% ER/PR positive and HER2 negative by FISH.  PET scan also showed widespread hypermetabolic osseous bone lesions she has been on Faslodex  since August 2018 with stable disease and most recent PET scan in December 2021 showed no evidence of recurrence or progression.  Patient was also on Xgeva  previously which was given between October 2018 but stopped in May 2019 due to dental issues   Patient was switched from Faslodex  to letrozole  in July 2022.  She was switched to tamoxifen  in January 2024  Interval history- Patient was admitted to the hospital in October 2020 for with bright red blood per rectum and conservative management was recommended given her age.  She is presently on tamoxifen  for her breast cancer which she is tolerating it well.  She  has baseline fatigue and chronic back pain  ECOG PS- 3 Pain scale- 3 Opioid associated constipation- no  Review of systems- Review of Systems  Constitutional:  Positive for malaise/fatigue. Negative for chills, fever and weight loss.  HENT:  Negative for congestion, ear discharge and nosebleeds.   Eyes:  Negative for blurred vision.  Respiratory:  Negative for cough, hemoptysis, sputum production, shortness of breath and wheezing.   Cardiovascular:  Negative for chest pain, palpitations, orthopnea and claudication.  Gastrointestinal:  Negative for abdominal pain, blood in stool, constipation, diarrhea, heartburn, melena, nausea and vomiting.  Genitourinary:  Negative for dysuria, flank pain, frequency, hematuria and urgency.  Musculoskeletal:  Positive for back pain. Negative for joint pain and myalgias.  Skin:  Negative for rash.  Neurological:  Negative for dizziness, tingling, focal weakness, seizures, weakness and headaches.  Endo/Heme/Allergies:  Does not bruise/bleed easily.  Psychiatric/Behavioral:  Negative for depression and suicidal ideas. The patient does not have insomnia.       Allergies  Allergen Reactions   Amitriptyline Hives   Amoxicillin      Diarrhea     Black Pepper [Piper]    Ivp Dye [Iodinated Contrast Media]    Tape Other (See Comments)    Whelps - please use paper tape.   Silicone Rash    Whelps - please use paper tape.   Sulfa Antibiotics Rash     Past Medical History:  Diagnosis Date   Anxiety    Arthritis    BOTH FEET AND KNEES   Breast cancer (HCC) 2000   left breast ca with lumpectomy and rad  tx 5 yr tamoxifen /Dr Arabella Beach at [facility]   Breast cancer of upper-outer quadrant of left female breast (HCC) 07/10/2017   Complication of anesthesia    WOKE UP DURING SURGERY FOR DEVIATED SEPTUM, KNEE REPLACEMENT AND BREAST LUMPECTOMY   Dysrhythmia    GERD (gastroesophageal reflux disease)    Hyperlipidemia 09/06/2023   Hypertension    Malignant neoplasm  metastatic to bone (HCC) 09/23/2017   Malignant neoplasm metastatic to bone (HCC) 09/23/2017   Neuropathic pain of both legs    Neuropathy    Personal history of chemotherapy    Personal history of radiation therapy    Pneumonia 2016   Skin cancer of forehead    Skin cancer of nose      Past Surgical History:  Procedure Laterality Date   ABDOMINAL HYSTERECTOMY     AXILLARY LYMPH NODE BIOPSY Left 07/10/2017   INVASIVE MAMMARY CARCINOMA WITH FOCAL LOBULAR FEATURES.    BREAST BIOPSY Bilateral 1960   benign   BREAST LUMPECTOMY Left 2000   breast ca with rad tx   BREAST LUMPECTOMY Left 08/20/2017   BREAST LUMPECTOMY Left 08/20/2017   Procedure: left breast wide excision;  Surgeon: Marshall Skeeter, MD;  Location: [facility] ORS;  Service: General;  Laterality: Left;   CATARACT EXTRACTION     left eye   CHOLECYSTECTOMY     JOINT REPLACEMENT     NASAL SEPTUM SURGERY     PARTIAL KNEE ARTHROPLASTY     right   TOTAL VAGINAL HYSTERECTOMY      Social History   Socioeconomic History   Marital status: Widowed    Spouse name: Not on file   Number of children: 3   Years of education: some college   Highest education level: 12th grade  Occupational History   Occupation: Retired  Tobacco Use   Smoking status: Never   Smokeless tobacco: Never   Tobacco comments:    smoking cessation materials not required  Vaping Use   Vaping status: Never Used  Substance and Sexual Activity   Alcohol use: No    Alcohol/week: 0.0 standard drinks of alcohol   Drug use: No   Sexual activity: Not Currently  Other Topics Concern   Not on file  Social History Narrative   01/08/24 Patient lives in skilled nursing at [facility]       Social Drivers of Health   Financial Resource Strain: Low Risk  (01/08/2024)   Overall Financial Resource Strain (CARDIA)    Difficulty of Paying Living Expenses: Not hard at all  Food Insecurity: No Food Insecurity (01/08/2024)   Hunger Vital Sign    Worried About  Running Out of Food in the Last Year: Never true    Ran Out of Food in the Last Year: Never true  Transportation Needs: No Transportation Needs (01/08/2024)   PRAPARE - Administrator, Civil Service (Medical): No    Lack of Transportation (Non-Medical): No  Physical Activity: Inactive (01/08/2024)   Exercise Vital Sign    Days of Exercise per Week: 0 days    Minutes of Exercise per Session: 0 min  Stress: No Stress Concern Present (01/08/2024)   [employer] of Occupational Health - Occupational Stress Questionnaire    Feeling of Stress : Not at all  Social Connections: Socially Isolated (01/08/2024)   Social Connection and Isolation Panel [NHANES]    Frequency of Communication with Friends and Family: More than three times a week    Frequency of Social Gatherings with Friends  and Family: Once a week    Attends Religious Services: Never    Active Member of Clubs or Organizations: No    Attends Banker Meetings: Never    Marital Status: Widowed  Intimate Partner Violence: Not At Risk (01/08/2024)   Humiliation, Afraid, Rape, and Kick questionnaire    Fear of Current or Ex-Partner: No    Emotionally Abused: No    Physically Abused: No    Sexually Abused: No    Family History  Problem Relation Age of Onset   Diabetes Mother    Heart disease Mother    Congestive Heart Failure Mother    Sudden death Father 70   Breast cancer Daughter 64       genetic negative   Heart attack Paternal Uncle    Cancer Paternal Grandmother      Current Outpatient Medications:    oxyCODONE (OXY IR/ROXICODONE) 5 MG immediate release tablet, Take by mouth., Disp: , Rfl:    traMADol  (ULTRAM ) 50 MG tablet, Take by mouth., Disp: , Rfl:    doxazosin  (CARDURA ) 1 MG tablet, TAKE 1 TABLET BY MOUTH TWICE  DAILY, Disp: 180 tablet, Rfl: 2   ezetimibe  (ZETIA ) 10 MG tablet, TAKE 1 TABLET BY MOUTH DAILY, Disp: 60 tablet, Rfl: 5   gabapentin  (NEURONTIN ) 300 MG capsule, Take 3 capsules  (900 mg total) by mouth 2 (two) times daily., Disp: 540 capsule, Rfl: 1   lisinopril  (ZESTRIL ) 40 MG tablet, Take 40 mg by mouth daily., Disp: , Rfl:    omeprazole  (PRILOSEC) 20 MG capsule, TAKE 1 CAPSULE BY MOUTH DAILY, Disp: 90 capsule, Rfl: 3   tamoxifen  (NOLVADEX ) 20 MG tablet, TAKE 1 TABLET BY MOUTH DAILY, Disp: 100 tablet, Rfl: 2  Physical exam:  Vitals:   03/09/24 1132  BP: 122/80  Pulse: 60  Resp: 19  Temp: 98.4 F (36.9 C)  TempSrc: Tympanic  SpO2: 97%  Weight: 198 lb (89.8 kg)  Height: 5\' 3"  (1.6 m)   Physical Exam Constitutional:      Comments: Sitting in a wheelchair.  Appears frail  Cardiovascular:     Rate and Rhythm: Normal rate and regular rhythm.     Heart sounds: Normal heart sounds.  Pulmonary:     Effort: Pulmonary effort is normal.     Breath sounds: Normal breath sounds.  Abdominal:     General: Bowel sounds are normal.     Palpations: Abdomen is soft.  Musculoskeletal:     Comments: Bilateral lower extremity edema and changes of stasis dermatitis  Skin:    General: Skin is warm and dry.  Neurological:     Mental Status: She is alert and oriented to person, place, and time.     I have personally reviewed labs listed below:    Latest Ref Rng & Units 09/09/2023    6:46 AM  CMP  Glucose 70 - 99 mg/dL 161   BUN 8 - 23 mg/dL 21   Creatinine 0.96 - 1.00 mg/dL 0.45   Sodium 409 - 811 mmol/L 138   Potassium 3.5 - 5.1 mmol/L 3.8   Chloride 98 - 111 mmol/L 109   CO2 22 - 32 mmol/L 25   Calcium 8.9 - 10.3 mg/dL 8.1       Latest Ref Rng & Units 03/09/2024   12:09 PM  CBC  WBC 4.0 - 10.5 K/uL 6.4   Hemoglobin 12.0 - 15.0 g/dL 91.4   Hematocrit 78.2 - 46.0 % 36.9   Platelets 150 -  400 K/uL 197      Assessment and plan- Patient is a 89 y.o. female with metastatic ER positive breast cancer currently on tamoxifen  here for routine follow-up visit  PET CT scan results were not back at the time of my visit..  Final results showed increasing  hypermetabolic activity at the L4 vertebral body which could be indeterminate when metastatic disease was not excluded.  Given her age I am inclined to observe this conservatively and consider getting another MRI when I see her back in 6 months or a PET scan.  CBC CMP CA 27-29 to be also repeated in 6 months.  We did do anemia workup today given that her hemoglobin was mildly low at 11.8.  Ferritin levels are low at 16 and B12 levels are low at 250.  Folate and TSH are normal.  We will call her with these findings. She will continue with tamoxifen  at this time   Visit Diagnosis 1. Fatigue, unspecified type   2. Iron deficiency anemia, unspecified iron deficiency anemia type   3. Carcinoma of breast metastatic to bone, unspecified laterality (HCC)   4. Encounter for monitoring tamoxifen  therapy      Dr. Seretha Dance, MD, MPH [facility] [phone] 03/29/2024 8:10 AM

## 2024-04-06 ENCOUNTER — Encounter (INDEPENDENT_AMBULATORY_CARE_PROVIDER_SITE_OTHER): Payer: Medicare Other | Admitting: Nurse Practitioner

## 2024-04-06 ENCOUNTER — Encounter (INDEPENDENT_AMBULATORY_CARE_PROVIDER_SITE_OTHER): Payer: Medicare Other

## 2024-04-06 ENCOUNTER — Telehealth (INDEPENDENT_AMBULATORY_CARE_PROVIDER_SITE_OTHER): Payer: Self-pay | Admitting: Nurse Practitioner

## 2024-04-06 NOTE — Telephone Encounter (Signed)
 Spoke to pt daughter she states that her mother does not want to come in for appt because she does not think the devices will help her be in less pain and swell less. Closing referral.

## 2024-04-09 ENCOUNTER — Encounter (INDEPENDENT_AMBULATORY_CARE_PROVIDER_SITE_OTHER): Payer: Self-pay

## 2024-04-09 DIAGNOSIS — C801 Malignant (primary) neoplasm, unspecified: Secondary | ICD-10-CM | POA: Diagnosis not present

## 2024-04-09 DIAGNOSIS — C7951 Secondary malignant neoplasm of bone: Secondary | ICD-10-CM | POA: Diagnosis not present

## 2024-04-13 ENCOUNTER — Other Ambulatory Visit: Payer: Self-pay | Admitting: Nurse Practitioner

## 2024-04-13 MED ORDER — TAMOXIFEN CITRATE 20 MG PO TABS: 20.0000 mg | ORAL_TABLET | Freq: Every day | ORAL | 3 refills | Status: AC

## 2024-04-18 ENCOUNTER — Encounter: Payer: Self-pay | Admitting: Hematology and Oncology

## 2024-04-26 DIAGNOSIS — R059 Cough, unspecified: Secondary | ICD-10-CM | POA: Diagnosis not present

## 2024-04-26 DIAGNOSIS — J811 Chronic pulmonary edema: Secondary | ICD-10-CM | POA: Diagnosis not present

## 2024-04-27 ENCOUNTER — Encounter (INDEPENDENT_AMBULATORY_CARE_PROVIDER_SITE_OTHER): Payer: Self-pay

## 2024-04-27 DIAGNOSIS — K589 Irritable bowel syndrome without diarrhea: Secondary | ICD-10-CM | POA: Diagnosis not present

## 2024-05-03 DIAGNOSIS — M25512 Pain in left shoulder: Secondary | ICD-10-CM | POA: Diagnosis not present

## 2024-05-05 DIAGNOSIS — M25519 Pain in unspecified shoulder: Secondary | ICD-10-CM | POA: Diagnosis not present

## 2024-05-25 DIAGNOSIS — M1712 Unilateral primary osteoarthritis, left knee: Secondary | ICD-10-CM | POA: Diagnosis not present

## 2024-05-25 DIAGNOSIS — C50919 Malignant neoplasm of unspecified site of unspecified female breast: Secondary | ICD-10-CM | POA: Diagnosis not present

## 2024-05-25 DIAGNOSIS — C7951 Secondary malignant neoplasm of bone: Secondary | ICD-10-CM | POA: Diagnosis not present

## 2024-05-25 DIAGNOSIS — Z789 Other specified health status: Secondary | ICD-10-CM | POA: Diagnosis not present

## 2024-05-25 DIAGNOSIS — I10 Essential (primary) hypertension: Secondary | ICD-10-CM | POA: Diagnosis not present

## 2024-06-09 DIAGNOSIS — M6281 Muscle weakness (generalized): Secondary | ICD-10-CM | POA: Diagnosis not present

## 2024-06-15 DIAGNOSIS — M6281 Muscle weakness (generalized): Secondary | ICD-10-CM | POA: Diagnosis not present

## 2024-06-24 DIAGNOSIS — M6281 Muscle weakness (generalized): Secondary | ICD-10-CM | POA: Diagnosis not present

## 2024-06-27 DIAGNOSIS — M6281 Muscle weakness (generalized): Secondary | ICD-10-CM | POA: Diagnosis not present

## 2024-06-29 DIAGNOSIS — G5793 Unspecified mononeuropathy of bilateral lower limbs: Secondary | ICD-10-CM | POA: Diagnosis not present

## 2024-06-29 DIAGNOSIS — Z789 Other specified health status: Secondary | ICD-10-CM | POA: Diagnosis not present

## 2024-06-29 DIAGNOSIS — C50919 Malignant neoplasm of unspecified site of unspecified female breast: Secondary | ICD-10-CM | POA: Diagnosis not present

## 2024-06-29 DIAGNOSIS — Z66 Do not resuscitate: Secondary | ICD-10-CM | POA: Diagnosis not present

## 2024-06-29 DIAGNOSIS — C7951 Secondary malignant neoplasm of bone: Secondary | ICD-10-CM | POA: Diagnosis not present

## 2024-07-02 DIAGNOSIS — M6281 Muscle weakness (generalized): Secondary | ICD-10-CM | POA: Diagnosis not present

## 2024-07-30 DIAGNOSIS — M1712 Unilateral primary osteoarthritis, left knee: Secondary | ICD-10-CM | POA: Diagnosis not present

## 2024-07-30 DIAGNOSIS — C7951 Secondary malignant neoplasm of bone: Secondary | ICD-10-CM | POA: Diagnosis not present

## 2024-07-30 DIAGNOSIS — I10 Essential (primary) hypertension: Secondary | ICD-10-CM | POA: Diagnosis not present

## 2024-07-30 DIAGNOSIS — C50919 Malignant neoplasm of unspecified site of unspecified female breast: Secondary | ICD-10-CM | POA: Diagnosis not present

## 2024-08-10 ENCOUNTER — Ambulatory Visit: Admitting: Cardiovascular Disease

## 2024-08-24 ENCOUNTER — Inpatient Hospital Stay: Admitting: Oncology

## 2024-08-24 ENCOUNTER — Inpatient Hospital Stay: Attending: Nurse Practitioner

## 2024-08-24 ENCOUNTER — Encounter: Payer: Self-pay | Admitting: Oncology

## 2024-08-28 ENCOUNTER — Encounter: Payer: Self-pay | Admitting: Oncology

## 2024-08-30 NOTE — Telephone Encounter (Signed)
Ok to cancel all appts.

## 2024-09-14 DIAGNOSIS — Z789 Other specified health status: Secondary | ICD-10-CM | POA: Diagnosis not present

## 2024-09-14 DIAGNOSIS — I10 Essential (primary) hypertension: Secondary | ICD-10-CM | POA: Diagnosis not present

## 2024-09-14 DIAGNOSIS — R532 Functional quadriplegia: Secondary | ICD-10-CM | POA: Diagnosis not present

## 2024-09-14 DIAGNOSIS — I739 Peripheral vascular disease, unspecified: Secondary | ICD-10-CM | POA: Diagnosis not present

## 2024-09-27 ENCOUNTER — Other Ambulatory Visit: Payer: Self-pay | Admitting: Nurse Practitioner

## 2024-09-29 NOTE — Telephone Encounter (Signed)
 Requested medication (s) are due for refill today: Yes  Requested medication (s) are on the active medication list: Yes  Last refill:  09/26/23  Future visit scheduled: No  Notes to clinic:  see request.    Requested Prescriptions  Pending Prescriptions Disp Refills   omeprazole  (PRILOSEC) 20 MG capsule [Pharmacy Med Name: Omeprazole  20 MG Oral Capsule Delayed Release] 100 capsule 2    Sig: TAKE 1 CAPSULE BY MOUTH DAILY     Gastroenterology: Proton Pump Inhibitors Failed - 09/29/2024  3:47 PM      Failed - Valid encounter within last 12 months    Recent Outpatient Visits   None

## 2025-01-13 ENCOUNTER — Telehealth: Payer: Self-pay | Admitting: Nurse Practitioner

## 2025-01-13 NOTE — Telephone Encounter (Unsigned)
 Copied from CRM [id number]. Topic: General - Other >> Jan 13, 2025  2:35 PM Tiffini S wrote: Reason for CRM: Patient is at a nursing home and is bed written- patient daughter Shandelle Borrelli received a message that a AWV appointment needed CB: [phone], asked for a call back in the afternoon around 1pm
